# Patient Record
Sex: Female | Born: 1950
Health system: Southern US, Community
[De-identification: ages and names within clinical notes are randomized; demographics above are authoritative.]

## PROBLEM LIST (undated history)

## (undated) DIAGNOSIS — K589 Irritable bowel syndrome without diarrhea: Secondary | ICD-10-CM

## (undated) DIAGNOSIS — R51 Headache: Secondary | ICD-10-CM

## (undated) DIAGNOSIS — Z8719 Personal history of other diseases of the digestive system: Secondary | ICD-10-CM

## (undated) DIAGNOSIS — Z90710 Acquired absence of both cervix and uterus: Secondary | ICD-10-CM

## (undated) DIAGNOSIS — E785 Hyperlipidemia, unspecified: Secondary | ICD-10-CM

## (undated) DIAGNOSIS — M8580 Other specified disorders of bone density and structure, unspecified site: Secondary | ICD-10-CM

## (undated) DIAGNOSIS — M1990 Unspecified osteoarthritis, unspecified site: Secondary | ICD-10-CM

## (undated) DIAGNOSIS — M779 Enthesopathy, unspecified: Secondary | ICD-10-CM

## (undated) DIAGNOSIS — D649 Anemia, unspecified: Secondary | ICD-10-CM

## (undated) DIAGNOSIS — I10 Essential (primary) hypertension: Secondary | ICD-10-CM

## (undated) DIAGNOSIS — S7292XA Unspecified fracture of left femur, initial encounter for closed fracture: Secondary | ICD-10-CM

## (undated) DIAGNOSIS — M2540 Effusion, unspecified joint: Secondary | ICD-10-CM

## (undated) DIAGNOSIS — Z889 Allergy status to unspecified drugs, medicaments and biological substances status: Secondary | ICD-10-CM

## (undated) DIAGNOSIS — Z9189 Other specified personal risk factors, not elsewhere classified: Secondary | ICD-10-CM

## (undated) DIAGNOSIS — S92919A Unspecified fracture of unspecified toe(s), initial encounter for closed fracture: Secondary | ICD-10-CM

## (undated) DIAGNOSIS — Z9889 Other specified postprocedural states: Secondary | ICD-10-CM

## (undated) DIAGNOSIS — M549 Dorsalgia, unspecified: Secondary | ICD-10-CM

## (undated) DIAGNOSIS — R05 Cough: Secondary | ICD-10-CM

## (undated) DIAGNOSIS — L0390 Cellulitis, unspecified: Secondary | ICD-10-CM

## (undated) DIAGNOSIS — K219 Gastro-esophageal reflux disease without esophagitis: Secondary | ICD-10-CM

## (undated) DIAGNOSIS — R519 Headache, unspecified: Secondary | ICD-10-CM

## (undated) DIAGNOSIS — R238 Other skin changes: Secondary | ICD-10-CM

## (undated) DIAGNOSIS — Z8709 Personal history of other diseases of the respiratory system: Secondary | ICD-10-CM

## (undated) DIAGNOSIS — R233 Spontaneous ecchymoses: Secondary | ICD-10-CM

## (undated) DIAGNOSIS — R55 Syncope and collapse: Secondary | ICD-10-CM

## (undated) DIAGNOSIS — Z981 Arthrodesis status: Secondary | ICD-10-CM

## (undated) DIAGNOSIS — M069 Rheumatoid arthritis, unspecified: Secondary | ICD-10-CM

## (undated) DIAGNOSIS — M797 Fibromyalgia: Secondary | ICD-10-CM

## (undated) DIAGNOSIS — G8929 Other chronic pain: Secondary | ICD-10-CM

## (undated) DIAGNOSIS — R112 Nausea with vomiting, unspecified: Secondary | ICD-10-CM

## (undated) DIAGNOSIS — T50995A Adverse effect of other drugs, medicaments and biological substances, initial encounter: Secondary | ICD-10-CM

## (undated) DIAGNOSIS — R002 Palpitations: Secondary | ICD-10-CM

## (undated) DIAGNOSIS — G629 Polyneuropathy, unspecified: Secondary | ICD-10-CM

## (undated) DIAGNOSIS — G4700 Insomnia, unspecified: Secondary | ICD-10-CM

## (undated) DIAGNOSIS — M2550 Pain in unspecified joint: Secondary | ICD-10-CM

## (undated) DIAGNOSIS — I4510 Unspecified right bundle-branch block: Secondary | ICD-10-CM

## (undated) DIAGNOSIS — Z9089 Acquired absence of other organs: Secondary | ICD-10-CM

## (undated) DIAGNOSIS — Z86010 Personal history of colonic polyps: Secondary | ICD-10-CM

## (undated) DIAGNOSIS — E049 Nontoxic goiter, unspecified: Secondary | ICD-10-CM

## (undated) HISTORY — PX: BUNIONECTOMY: SHX129

## (undated) HISTORY — DX: Unspecified fracture of left femur, initial encounter for closed fracture: S72.92XA

## (undated) HISTORY — DX: Polyneuropathy, unspecified: G62.9

## (undated) HISTORY — DX: Syncope and collapse: R55

## (undated) HISTORY — DX: Arthrodesis status: Z98.1

## (undated) HISTORY — DX: Other specified disorders of bone density and structure, unspecified site: M85.80

## (undated) HISTORY — DX: Nontoxic goiter, unspecified: E04.9

## (undated) HISTORY — PX: TONSILLECTOMY: SUR1361

## (undated) HISTORY — DX: Rheumatoid arthritis, unspecified: M06.9

## (undated) HISTORY — DX: Palpitations: R00.2

## (undated) HISTORY — DX: Unspecified right bundle-branch block: I45.10

## (undated) HISTORY — DX: Acquired absence of other organs: Z90.89

## (undated) HISTORY — DX: Acquired absence of both cervix and uterus: Z90.710

## (undated) HISTORY — DX: Cough: R05

## (undated) HISTORY — DX: Hyperlipidemia, unspecified: E78.5

## (undated) HISTORY — PX: BACK SURGERY: SHX140

## (undated) HISTORY — DX: Irritable bowel syndrome without diarrhea: K58.9

## (undated) HISTORY — DX: Personal history of other diseases of the digestive system: Z87.19

## (undated) HISTORY — PX: KNEE ARTHROPLASTY: SHX992

## (undated) HISTORY — DX: Adverse effect of other drugs, medicaments and biological substances, initial encounter: T50.995A

## (undated) HISTORY — DX: Essential (primary) hypertension: I10

## (undated) HISTORY — DX: Other specified personal risk factors, not elsewhere classified: Z91.89

## (undated) HISTORY — PX: JOINT REPLACEMENT: SHX530

## (undated) HISTORY — PX: CERVICAL FUSION: SHX112

## (undated) HISTORY — DX: Unspecified osteoarthritis, unspecified site: M19.90

## (undated) HISTORY — DX: Personal history of colonic polyps: Z86.010

## (undated) HISTORY — DX: Unspecified fracture of unspecified toe(s), initial encounter for closed fracture: S92.919A

## (undated) HISTORY — DX: Cellulitis, unspecified: L03.90

## (undated) HISTORY — PX: OTHER SURGICAL HISTORY: SHX169

---

## 1984-03-02 HISTORY — PX: TOTAL ABDOMINAL HYSTERECTOMY W/ BILATERAL SALPINGOOPHORECTOMY: SHX83

## 1987-03-03 HISTORY — PX: REDUCTION MAMMAPLASTY: SUR839

## 1994-03-02 HISTORY — PX: CHOLECYSTECTOMY: SHX55

## 1994-12-09 ENCOUNTER — Encounter: Payer: Self-pay | Admitting: Internal Medicine

## 1997-12-28 ENCOUNTER — Ambulatory Visit (HOSPITAL_COMMUNITY): Admission: RE | Admit: 1997-12-28 | Discharge: 1997-12-28 | Payer: Self-pay | Admitting: Obstetrics and Gynecology

## 1997-12-28 ENCOUNTER — Encounter: Payer: Self-pay | Admitting: Obstetrics and Gynecology

## 1998-04-19 ENCOUNTER — Other Ambulatory Visit: Admission: RE | Admit: 1998-04-19 | Discharge: 1998-04-19 | Payer: Self-pay | Admitting: Obstetrics and Gynecology

## 1998-07-01 ENCOUNTER — Other Ambulatory Visit: Admission: RE | Admit: 1998-07-01 | Discharge: 1998-07-01 | Payer: Self-pay | Admitting: Obstetrics and Gynecology

## 1998-11-27 ENCOUNTER — Other Ambulatory Visit: Admission: RE | Admit: 1998-11-27 | Discharge: 1998-11-27 | Payer: Self-pay | Admitting: Obstetrics and Gynecology

## 1999-01-22 ENCOUNTER — Ambulatory Visit (HOSPITAL_COMMUNITY): Admission: RE | Admit: 1999-01-22 | Discharge: 1999-01-22 | Payer: Self-pay | Admitting: Obstetrics and Gynecology

## 1999-01-22 ENCOUNTER — Encounter: Payer: Self-pay | Admitting: Obstetrics and Gynecology

## 1999-07-21 ENCOUNTER — Encounter: Payer: Self-pay | Admitting: Neurosurgery

## 1999-07-25 ENCOUNTER — Encounter: Payer: Self-pay | Admitting: Neurosurgery

## 1999-07-25 ENCOUNTER — Ambulatory Visit (HOSPITAL_COMMUNITY): Admission: RE | Admit: 1999-07-25 | Discharge: 1999-07-25 | Payer: Self-pay | Admitting: Neurosurgery

## 1999-10-13 ENCOUNTER — Other Ambulatory Visit: Admission: RE | Admit: 1999-10-13 | Discharge: 1999-10-13 | Payer: Self-pay | Admitting: Obstetrics and Gynecology

## 2000-02-04 ENCOUNTER — Ambulatory Visit (HOSPITAL_COMMUNITY): Admission: RE | Admit: 2000-02-04 | Discharge: 2000-02-04 | Payer: Self-pay | Admitting: Gastroenterology

## 2000-02-27 ENCOUNTER — Encounter: Payer: Self-pay | Admitting: Obstetrics and Gynecology

## 2000-02-27 ENCOUNTER — Ambulatory Visit (HOSPITAL_COMMUNITY): Admission: RE | Admit: 2000-02-27 | Discharge: 2000-02-27 | Payer: Self-pay | Admitting: Obstetrics and Gynecology

## 2000-10-25 ENCOUNTER — Ambulatory Visit (HOSPITAL_COMMUNITY): Admission: RE | Admit: 2000-10-25 | Discharge: 2000-10-25 | Payer: Self-pay | Admitting: Gastroenterology

## 2000-12-10 ENCOUNTER — Other Ambulatory Visit: Admission: RE | Admit: 2000-12-10 | Discharge: 2000-12-10 | Payer: Self-pay | Admitting: Obstetrics and Gynecology

## 2001-03-17 ENCOUNTER — Ambulatory Visit (HOSPITAL_COMMUNITY): Admission: RE | Admit: 2001-03-17 | Discharge: 2001-03-17 | Payer: Self-pay | Admitting: Obstetrics and Gynecology

## 2001-03-17 ENCOUNTER — Encounter: Payer: Self-pay | Admitting: Obstetrics and Gynecology

## 2001-03-24 ENCOUNTER — Encounter: Payer: Self-pay | Admitting: Obstetrics and Gynecology

## 2001-03-24 ENCOUNTER — Encounter: Admission: RE | Admit: 2001-03-24 | Discharge: 2001-03-24 | Payer: Self-pay | Admitting: Obstetrics and Gynecology

## 2002-04-03 ENCOUNTER — Encounter: Admission: RE | Admit: 2002-04-03 | Discharge: 2002-04-03 | Payer: Self-pay | Admitting: Obstetrics and Gynecology

## 2002-04-03 ENCOUNTER — Encounter: Payer: Self-pay | Admitting: Obstetrics and Gynecology

## 2003-04-17 ENCOUNTER — Encounter: Admission: RE | Admit: 2003-04-17 | Discharge: 2003-04-17 | Payer: Self-pay | Admitting: Obstetrics and Gynecology

## 2003-11-26 ENCOUNTER — Encounter: Admission: RE | Admit: 2003-11-26 | Discharge: 2003-11-26 | Payer: Self-pay | Admitting: Internal Medicine

## 2004-01-31 ENCOUNTER — Ambulatory Visit: Payer: Self-pay | Admitting: Internal Medicine

## 2004-02-08 ENCOUNTER — Ambulatory Visit: Payer: Self-pay | Admitting: Internal Medicine

## 2004-04-23 ENCOUNTER — Encounter: Admission: RE | Admit: 2004-04-23 | Discharge: 2004-04-23 | Payer: Self-pay | Admitting: Obstetrics and Gynecology

## 2004-09-11 ENCOUNTER — Observation Stay (HOSPITAL_COMMUNITY): Admission: RE | Admit: 2004-09-11 | Discharge: 2004-09-12 | Payer: Self-pay | Admitting: Orthopedic Surgery

## 2004-12-12 ENCOUNTER — Ambulatory Visit: Payer: Self-pay | Admitting: Internal Medicine

## 2005-04-03 ENCOUNTER — Other Ambulatory Visit: Admission: RE | Admit: 2005-04-03 | Discharge: 2005-04-03 | Payer: Self-pay | Admitting: Obstetrics & Gynecology

## 2005-05-04 ENCOUNTER — Encounter: Admission: RE | Admit: 2005-05-04 | Discharge: 2005-05-04 | Payer: Self-pay | Admitting: Obstetrics & Gynecology

## 2006-01-11 ENCOUNTER — Ambulatory Visit: Payer: Self-pay | Admitting: Internal Medicine

## 2006-03-16 ENCOUNTER — Ambulatory Visit: Payer: Self-pay | Admitting: Cardiovascular Disease

## 2006-03-30 ENCOUNTER — Other Ambulatory Visit: Admission: RE | Admit: 2006-03-30 | Discharge: 2006-03-30 | Payer: Self-pay | Admitting: Obstetrics & Gynecology

## 2006-04-05 ENCOUNTER — Ambulatory Visit: Payer: Self-pay

## 2006-04-05 ENCOUNTER — Encounter: Payer: Self-pay | Admitting: Internal Medicine

## 2006-04-05 ENCOUNTER — Encounter: Payer: Self-pay | Admitting: Cardiology

## 2006-05-24 ENCOUNTER — Encounter: Admission: RE | Admit: 2006-05-24 | Discharge: 2006-05-24 | Payer: Self-pay | Admitting: Obstetrics & Gynecology

## 2006-09-17 ENCOUNTER — Ambulatory Visit: Payer: Self-pay | Admitting: Internal Medicine

## 2006-12-02 ENCOUNTER — Encounter: Payer: Self-pay | Admitting: Internal Medicine

## 2006-12-29 ENCOUNTER — Ambulatory Visit: Payer: Self-pay | Admitting: Internal Medicine

## 2006-12-29 DIAGNOSIS — M069 Rheumatoid arthritis, unspecified: Secondary | ICD-10-CM | POA: Insufficient documentation

## 2006-12-29 DIAGNOSIS — E785 Hyperlipidemia, unspecified: Secondary | ICD-10-CM

## 2006-12-29 DIAGNOSIS — I10 Essential (primary) hypertension: Secondary | ICD-10-CM

## 2006-12-29 DIAGNOSIS — J019 Acute sinusitis, unspecified: Secondary | ICD-10-CM | POA: Insufficient documentation

## 2006-12-29 HISTORY — DX: Essential (primary) hypertension: I10

## 2006-12-29 HISTORY — DX: Hyperlipidemia, unspecified: E78.5

## 2007-01-07 ENCOUNTER — Encounter: Payer: Self-pay | Admitting: Internal Medicine

## 2007-02-09 ENCOUNTER — Ambulatory Visit: Payer: Self-pay | Admitting: Internal Medicine

## 2007-02-09 DIAGNOSIS — T50995A Adverse effect of other drugs, medicaments and biological substances, initial encounter: Secondary | ICD-10-CM

## 2007-02-09 DIAGNOSIS — Z860100 Personal history of colon polyps, unspecified: Secondary | ICD-10-CM

## 2007-02-09 DIAGNOSIS — Z9089 Acquired absence of other organs: Secondary | ICD-10-CM | POA: Insufficient documentation

## 2007-02-09 DIAGNOSIS — Z9189 Other specified personal risk factors, not elsewhere classified: Secondary | ICD-10-CM

## 2007-02-09 DIAGNOSIS — Z8719 Personal history of other diseases of the digestive system: Secondary | ICD-10-CM | POA: Insufficient documentation

## 2007-02-09 DIAGNOSIS — I4510 Unspecified right bundle-branch block: Secondary | ICD-10-CM

## 2007-02-09 DIAGNOSIS — Z8711 Personal history of peptic ulcer disease: Secondary | ICD-10-CM | POA: Insufficient documentation

## 2007-02-09 DIAGNOSIS — R002 Palpitations: Secondary | ICD-10-CM | POA: Insufficient documentation

## 2007-02-09 HISTORY — DX: Other specified personal risk factors, not elsewhere classified: Z91.89

## 2007-02-09 HISTORY — DX: Personal history of colon polyps, unspecified: Z86.0100

## 2007-02-09 HISTORY — DX: Palpitations: R00.2

## 2007-02-09 HISTORY — DX: Personal history of other diseases of the digestive system: Z87.19

## 2007-02-09 HISTORY — DX: Personal history of colonic polyps: Z86.010

## 2007-02-09 HISTORY — DX: Unspecified right bundle-branch block: I45.10

## 2007-02-09 HISTORY — DX: Adverse effect of other drugs, medicaments and biological substances, initial encounter: T50.995A

## 2007-04-05 ENCOUNTER — Encounter: Payer: Self-pay | Admitting: Internal Medicine

## 2007-04-21 ENCOUNTER — Ambulatory Visit: Payer: Self-pay | Admitting: Internal Medicine

## 2007-04-24 LAB — CONVERTED CEMR LAB
BUN: 13 mg/dL (ref 6–23)
CO2: 28 meq/L (ref 19–32)
Calcium: 9.9 mg/dL (ref 8.4–10.5)
Chloride: 104 meq/L (ref 96–112)
Cholesterol: 210 mg/dL (ref 0–200)
Creatinine, Ser: 0.8 mg/dL (ref 0.4–1.2)
Direct LDL: 108.9 mg/dL
GFR calc Af Amer: 96 mL/min
GFR calc non Af Amer: 79 mL/min
Glucose, Bld: 112 mg/dL — ABNORMAL HIGH (ref 70–99)
HDL: 69.7 mg/dL (ref >39.0)
Potassium: 4.4 meq/L (ref 3.5–5.1)
Sodium: 142 meq/L (ref 135–145)
TSH: 1.07 microintl units/mL (ref 0.35–5.50)
Total CHOL/HDL Ratio: 3
Triglycerides: 80 mg/dL (ref 0–149)
VLDL: 16 mg/dL (ref 0–40)

## 2007-04-28 ENCOUNTER — Ambulatory Visit: Payer: Self-pay | Admitting: Internal Medicine

## 2007-04-28 DIAGNOSIS — K589 Irritable bowel syndrome without diarrhea: Secondary | ICD-10-CM

## 2007-04-28 HISTORY — DX: Irritable bowel syndrome, unspecified: K58.9

## 2007-04-28 LAB — CONVERTED CEMR LAB
Cholesterol, target level: 200 mg/dL
HDL goal, serum: 40 mg/dL
LDL Goal: 160 mg/dL

## 2007-05-12 ENCOUNTER — Other Ambulatory Visit: Admission: RE | Admit: 2007-05-12 | Discharge: 2007-05-12 | Payer: Self-pay | Admitting: Obstetrics & Gynecology

## 2007-05-26 ENCOUNTER — Encounter: Admission: RE | Admit: 2007-05-26 | Discharge: 2007-05-26 | Payer: Self-pay | Admitting: Obstetrics & Gynecology

## 2007-06-16 ENCOUNTER — Encounter: Payer: Self-pay | Admitting: Internal Medicine

## 2007-07-15 ENCOUNTER — Ambulatory Visit: Payer: Self-pay | Admitting: Psychology

## 2007-07-26 ENCOUNTER — Encounter: Payer: Self-pay | Admitting: Internal Medicine

## 2007-08-02 ENCOUNTER — Ambulatory Visit: Payer: Self-pay

## 2007-08-30 ENCOUNTER — Ambulatory Visit: Payer: Self-pay

## 2007-09-07 ENCOUNTER — Encounter: Payer: Self-pay | Admitting: Internal Medicine

## 2007-09-21 ENCOUNTER — Ambulatory Visit: Payer: Self-pay

## 2007-10-25 ENCOUNTER — Encounter: Payer: Self-pay | Admitting: Internal Medicine

## 2007-11-09 ENCOUNTER — Ambulatory Visit: Payer: Self-pay

## 2007-12-03 ENCOUNTER — Emergency Department (HOSPITAL_COMMUNITY): Admission: EM | Admit: 2007-12-03 | Discharge: 2007-12-03 | Payer: Self-pay | Admitting: Emergency Medicine

## 2007-12-07 ENCOUNTER — Ambulatory Visit: Payer: Self-pay | Admitting: Internal Medicine

## 2007-12-07 DIAGNOSIS — R55 Syncope and collapse: Secondary | ICD-10-CM | POA: Insufficient documentation

## 2007-12-07 DIAGNOSIS — N959 Unspecified menopausal and perimenopausal disorder: Secondary | ICD-10-CM | POA: Insufficient documentation

## 2007-12-07 DIAGNOSIS — R21 Rash and other nonspecific skin eruption: Secondary | ICD-10-CM | POA: Insufficient documentation

## 2007-12-07 HISTORY — DX: Syncope and collapse: R55

## 2007-12-12 ENCOUNTER — Ambulatory Visit: Payer: Self-pay | Admitting: Cardiovascular Disease

## 2007-12-13 ENCOUNTER — Ambulatory Visit: Payer: Self-pay

## 2008-01-17 ENCOUNTER — Ambulatory Visit: Payer: Self-pay

## 2008-01-24 ENCOUNTER — Encounter: Payer: Self-pay | Admitting: Internal Medicine

## 2008-05-07 ENCOUNTER — Encounter: Admission: RE | Admit: 2008-05-07 | Discharge: 2008-05-07 | Payer: Self-pay | Admitting: Obstetrics & Gynecology

## 2008-05-17 ENCOUNTER — Other Ambulatory Visit: Admission: RE | Admit: 2008-05-17 | Discharge: 2008-05-17 | Payer: Self-pay | Admitting: Obstetrics & Gynecology

## 2008-07-09 ENCOUNTER — Telehealth: Payer: Self-pay | Admitting: *Deleted

## 2008-11-28 ENCOUNTER — Telehealth: Payer: Self-pay | Admitting: *Deleted

## 2008-12-18 ENCOUNTER — Ambulatory Visit: Payer: Self-pay | Admitting: Internal Medicine

## 2008-12-18 DIAGNOSIS — R635 Abnormal weight gain: Secondary | ICD-10-CM | POA: Insufficient documentation

## 2008-12-21 ENCOUNTER — Ambulatory Visit: Payer: Self-pay | Admitting: Internal Medicine

## 2008-12-24 LAB — CONVERTED CEMR LAB
ALT: 18 units/L (ref 0–35)
AST: 21 units/L (ref 0–37)
Albumin: 3.9 g/dL (ref 3.5–5.2)
Alkaline Phosphatase: 66 units/L (ref 39–117)
BUN: 22 mg/dL (ref 6–23)
Basophils Absolute: 0.1 10*3/uL (ref 0.0–0.1)
Basophils Relative: 1.8 % (ref 0.0–3.0)
Bilirubin, Direct: 0.1 mg/dL (ref 0.0–0.3)
CO2: 30 meq/L (ref 19–32)
Calcium: 9.1 mg/dL (ref 8.4–10.5)
Chloride: 105 meq/L (ref 96–112)
Cholesterol: 250 mg/dL — ABNORMAL HIGH (ref 0–200)
Creatinine, Ser: 0.8 mg/dL (ref 0.4–1.2)
Direct LDL: 162.5 mg/dL
Eosinophils Absolute: 0.1 10*3/uL (ref 0.0–0.7)
Eosinophils Relative: 1.3 % (ref 0.0–5.0)
GFR calc non Af Amer: 78.7 mL/min (ref >60)
Glucose, Bld: 96 mg/dL (ref 70–99)
HCT: 39.6 % (ref 36.0–46.0)
HDL: 64.5 mg/dL (ref >39.00)
Hemoglobin: 13.6 g/dL (ref 12.0–15.0)
Lymphocytes Relative: 32.8 % (ref 12.0–46.0)
Lymphs Abs: 1.6 10*3/uL (ref 0.7–4.0)
MCHC: 34.4 g/dL (ref 30.0–36.0)
MCV: 95.5 fL (ref 78.0–100.0)
Monocytes Absolute: 0.3 10*3/uL (ref 0.1–1.0)
Monocytes Relative: 6 % (ref 3.0–12.0)
Neutro Abs: 2.7 10*3/uL (ref 1.4–7.7)
Neutrophils Relative %: 58.1 % (ref 43.0–77.0)
Platelets: 279 10*3/uL (ref 150.0–400.0)
Potassium: 4.4 meq/L (ref 3.5–5.1)
RBC: 4.15 M/uL (ref 3.87–5.11)
RDW: 11.8 % (ref 11.5–14.6)
Sodium: 143 meq/L (ref 135–145)
TSH: 0.58 microintl units/mL (ref 0.35–5.50)
Total Bilirubin: 1.1 mg/dL (ref 0.3–1.2)
Total CHOL/HDL Ratio: 4
Total CK: 58 units/L (ref 7–177)
Total Protein: 7.3 g/dL (ref 6.0–8.3)
Triglycerides: 115 mg/dL (ref 0.0–149.0)
VLDL: 23 mg/dL (ref 0.0–40.0)
WBC: 4.8 10*3/uL (ref 4.5–10.5)

## 2008-12-25 ENCOUNTER — Encounter (INDEPENDENT_AMBULATORY_CARE_PROVIDER_SITE_OTHER): Payer: Self-pay | Admitting: *Deleted

## 2009-01-29 ENCOUNTER — Telehealth: Payer: Self-pay | Admitting: *Deleted

## 2009-03-21 ENCOUNTER — Encounter: Payer: Self-pay | Admitting: Internal Medicine

## 2009-04-15 ENCOUNTER — Encounter: Payer: Self-pay | Admitting: Internal Medicine

## 2009-04-25 ENCOUNTER — Telehealth: Payer: Self-pay | Admitting: *Deleted

## 2009-04-25 ENCOUNTER — Ambulatory Visit: Payer: Self-pay | Admitting: Internal Medicine

## 2009-04-26 ENCOUNTER — Telehealth: Payer: Self-pay | Admitting: Internal Medicine

## 2009-04-29 LAB — CONVERTED CEMR LAB
Cholesterol: 235 mg/dL — ABNORMAL HIGH (ref 0–200)
Direct LDL: 138.4 mg/dL
HDL: 83.3 mg/dL (ref >39.00)
Total CHOL/HDL Ratio: 3
Triglycerides: 92 mg/dL (ref 0.0–149.0)
VLDL: 18.4 mg/dL (ref 0.0–40.0)

## 2009-05-16 ENCOUNTER — Encounter: Payer: Self-pay | Admitting: Internal Medicine

## 2009-05-28 ENCOUNTER — Encounter: Admission: RE | Admit: 2009-05-28 | Discharge: 2009-05-28 | Payer: Self-pay | Admitting: Obstetrics & Gynecology

## 2009-05-31 LAB — CONVERTED CEMR LAB: Pap Smear: NORMAL

## 2009-07-16 ENCOUNTER — Telehealth: Payer: Self-pay | Admitting: *Deleted

## 2009-07-23 ENCOUNTER — Ambulatory Visit: Payer: Self-pay | Admitting: Internal Medicine

## 2009-07-23 DIAGNOSIS — H6120 Impacted cerumen, unspecified ear: Secondary | ICD-10-CM | POA: Insufficient documentation

## 2009-08-06 ENCOUNTER — Encounter: Payer: Self-pay | Admitting: Internal Medicine

## 2009-10-01 ENCOUNTER — Encounter: Admission: RE | Admit: 2009-10-01 | Discharge: 2009-10-01 | Payer: Self-pay | Admitting: Rheumatology

## 2009-12-31 ENCOUNTER — Telehealth: Payer: Self-pay | Admitting: *Deleted

## 2010-01-01 ENCOUNTER — Encounter: Payer: Self-pay | Admitting: Internal Medicine

## 2010-01-01 ENCOUNTER — Encounter: Admission: RE | Admit: 2010-01-01 | Discharge: 2010-01-01 | Payer: Self-pay | Admitting: Rheumatology

## 2010-01-03 ENCOUNTER — Encounter: Admission: RE | Admit: 2010-01-03 | Discharge: 2010-01-03 | Payer: Self-pay | Admitting: Rheumatology

## 2010-01-03 ENCOUNTER — Encounter: Payer: Self-pay | Admitting: Internal Medicine

## 2010-01-10 ENCOUNTER — Telehealth: Payer: Self-pay | Admitting: Internal Medicine

## 2010-01-21 ENCOUNTER — Encounter: Payer: Self-pay | Admitting: Internal Medicine

## 2010-02-06 ENCOUNTER — Ambulatory Visit: Payer: Self-pay | Admitting: Internal Medicine

## 2010-02-06 ENCOUNTER — Encounter: Payer: Self-pay | Admitting: Internal Medicine

## 2010-02-06 LAB — CONVERTED CEMR LAB
ALT: 19 U/L
AST: 17 U/L
Albumin: 4.1 g/dL
Alkaline Phosphatase: 64 U/L
BUN: 24 mg/dL — ABNORMAL HIGH
Basophils Absolute: 0.1 10*3/uL
Basophils Relative: 1.7 %
Bilirubin Urine: NEGATIVE
Bilirubin, Direct: 0.1 mg/dL
CO2: 28 meq/L
Calcium: 9.3 mg/dL
Chloride: 103 meq/L
Cholesterol: 287 mg/dL — ABNORMAL HIGH
Creatinine, Ser: 0.8 mg/dL
Direct LDL: 168.4 mg/dL
Eosinophils Absolute: 0.1 10*3/uL
Eosinophils Relative: 0.8 %
GFR calc non Af Amer: 83.16 mL/min
Glucose, Bld: 76 mg/dL
Glucose, Urine, Semiquant: NEGATIVE
HCT: 36.9 %
HDL: 84.6 mg/dL
Hemoglobin: 12.7 g/dL
Ketones, urine, test strip: NEGATIVE
Lymphocytes Relative: 38.9 %
Lymphs Abs: 2.7 10*3/uL
MCHC: 34.3 g/dL
MCV: 93.8 fL
Monocytes Absolute: 0.4 10*3/uL
Monocytes Relative: 5.6 %
Neutro Abs: 3.7 10*3/uL
Neutrophils Relative %: 53 %
Nitrite: NEGATIVE
Platelets: 302 10*3/uL
Potassium: 3.7 meq/L
RBC: 3.94 M/uL
RDW: 14.6 %
Sodium: 141 meq/L
Specific Gravity, Urine: 1.02
TSH: 1 u[IU]/mL
Total Bilirubin: 0.8 mg/dL
Total CHOL/HDL Ratio: 3
Total Protein: 7.1 g/dL
Triglycerides: 111 mg/dL
Urobilinogen, UA: 0.2
VLDL: 22.2 mg/dL
Vit D, 25-Hydroxy: 47 ng/mL
WBC Urine, dipstick: NEGATIVE
WBC: 6.9 10*3/uL
pH: 5

## 2010-02-19 ENCOUNTER — Ambulatory Visit: Payer: Self-pay | Admitting: Internal Medicine

## 2010-02-19 DIAGNOSIS — M899 Disorder of bone, unspecified: Secondary | ICD-10-CM | POA: Insufficient documentation

## 2010-02-19 DIAGNOSIS — K219 Gastro-esophageal reflux disease without esophagitis: Secondary | ICD-10-CM | POA: Insufficient documentation

## 2010-02-19 DIAGNOSIS — G479 Sleep disorder, unspecified: Secondary | ICD-10-CM | POA: Insufficient documentation

## 2010-02-19 DIAGNOSIS — M949 Disorder of cartilage, unspecified: Secondary | ICD-10-CM

## 2010-02-19 DIAGNOSIS — J309 Allergic rhinitis, unspecified: Secondary | ICD-10-CM | POA: Insufficient documentation

## 2010-02-19 DIAGNOSIS — M129 Arthropathy, unspecified: Secondary | ICD-10-CM | POA: Insufficient documentation

## 2010-02-19 DIAGNOSIS — M4802 Spinal stenosis, cervical region: Secondary | ICD-10-CM | POA: Insufficient documentation

## 2010-02-19 LAB — CONVERTED CEMR LAB
Bilirubin Urine: NEGATIVE
Blood in Urine, dipstick: NEGATIVE
Glucose, Urine, Semiquant: NEGATIVE
Ketones, urine, test strip: NEGATIVE
Nitrite: NEGATIVE
Protein, U semiquant: NEGATIVE
Specific Gravity, Urine: 1.015
Urobilinogen, UA: 0.2
WBC Urine, dipstick: NEGATIVE
pH: 6

## 2010-03-04 ENCOUNTER — Encounter: Payer: Self-pay | Admitting: Internal Medicine

## 2010-03-10 ENCOUNTER — Encounter: Payer: Self-pay | Admitting: Internal Medicine

## 2010-04-01 NOTE — Assessment & Plan Note (Signed)
Summary: post hosp per dr Remi Lopata/ccm   Vital Signs:  Patient Profile:   60 Years Old Female Weight:      187 pounds Pulse rate:   72 / minute BP sitting:   160 / 100  (left arm) Cuff size:   regular  Vitals Entered By: Romualdo Bolk, CMA (December 07, 2007 10:22 AM)                  Chief Complaint:  Post Hospital Follow Up.  History of Present Illness: Jade Cardenas is here for a post hospital follow up. For syncope  and couple of other problems  Had syncope with prodrome had sweating and then lightheaded   and no cp sob. see er notes.   Had nl ekg and labs  . ? if from dehydration as needed 2 liters to get urin output.  Was  out for 5 minutes .   Felt drained and came to and went to er where BP was elevated.   Past hx of syncope rarely   related to heat . Saw Dr Eden Emms 2 years ago had  rbbb and ok echo .   Stress at work Husband died suddently  this year .  But she is working  well.   BP readings :were   up and we increased enalapril to 20 mg  but this caused .se and she decreased to 15 mg .  Her BP readings over the summer an at [facility] are usually 110-130 range with hr 80-100. She does not have a cuff with her today. taking Vemma  vitamin supplements ( mostly vitamins )  feels more energetic    ? not a stimulant. Local reaction to flu shot recently plese check  .Had tenderness and swelling  . improved .    Heel bone spur  and had been put on indocin for 2 weeks and then as needed.  NO recent meds changes but off of MTX . R upper arm itchy rash for weeks no rx ecept lotion .    Prior Medications Reviewed Using: Patient Recall  Updated Prior Medication List: VIVELLE-DOT 0.0375 MG/24HR  PTTW (ESTRADIOL)  LOVASTATIN 40 MG  TABS (LOVASTATIN) 1 by mouth once daily VASOTEC 10 MG  TABS (ENALAPRIL MALEATE) 1 by mouth once daily ACCU-CHEK AVIVA   STRP (GLUCOSE BLOOD) check blood sugar as directed  Current Allergies (reviewed today): ! 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     Physical Exam  General:     alert, well-developed, and well-nourished.   Head:     normocephalic and atraumatic.   Eyes:     vision grossly intact, pupils equal, and  pupils round.   Ears:     R ear normal and L ear normal.   Neck:     supple, full ROM, and no masses.   Lungs:     Normal respiratory effort, chest expands symmetrically. Lungs are clear to auscultation, no crackles or wheezes.no dullness.   Heart:     Normal rate and regular rhythm. S1 and S2 normal without gallop, murmur, click, rub or other extra sounds.no lifts.   Abdomen:     Bowel sounds positive,abdomen soft and non-tender without masses, organomegaly onoted.no rebound tenderness.   Msk:     no acute effusions r upper arm with about 6 cm [place] swelling without redness or warmth mild tenderness.  Pulses:     intact  without delay Extremities:     no cce  Neurologic:     alert & oriented X3, gait normal, and DTRs symmetrical and normal.   Skin:     turgor normal and color normal.   sun changes r upper arm anteriorly with red raised papular rash with out vesicle or pustule . no scaling  Cervical Nodes:     No lymphadenopathy noted Psych:     Oriented X3, normally interactive, and  good eye contact.  appropriate Additional Exam:     see er records  labs normal    Impression & Recommendations:  Problem # 1:  SYNCOPE (ICD-780.2) may be vasovagal but pt wasnt aware she was dehydrated .   Orders: Cardiology Referral (Cardiology)   Problem # 2:  HYPERTENSION (ICD-401.9) up today but all her home readings are ok .Marland Kitchen? if labile ht vs other  ? if b blocker would help but dont want to  risk hypotension  if her readings are really that low at home.  REc get cards to see for opinion .   Disc use of vasotec oten is needed two times a day .   Her updated medication list for this problem includes:    Vasotec 10 Mg Tabs (Enalapril maleate) .Marland Kitchen... 1 by mouth once daily  Orders: Cardiology Referral (Cardiology)   Problem # 3:  RIGHT BUNDLE BRANCH BLOCK (ICD-426.4) Assessment: Comment Only  Problem # 4:  RHEUMATOID ARTHRITIS (ICD-714.0)  The following medications were removed from the medication list:    Methotrexate 2.5 Mg Tabs (Methotrexate sodium) .Marland KitchenMarland KitchenMarland KitchenMarland Kitchen 4 every week   Problem # 5:  HYPERLIPIDEMIA (ICD-272.4)  Her updated medication list for this problem includes:    Lovastatin 40 Mg Tabs (Lovastatin) .Marland Kitchen... 1 by mouth once daily   Problem # 6:  MENOPAUSAL DISORDER (ICD-627.9)  Her updated medication list for this problem includes:    Vivelle-dot 0.0375 Mg/24hr Pttw (Estradiol)   Problem # 7:  RASH AND OTHER NONSPECIFIC SKIN ERUPTION (ICD-782.1) seems allergic but if no better in 2-3 weeks get derm to see Her updated medication list for this problem includes:    Fluocinonide 0.05 % Crea (Fluocinonide) .Marland Kitchen... Apply to  rash    two times a day for 2 weeks  or as directed   Problem # 8:  large local rx to flu shot  local care  and recheck as needed   Complete Medication List: 1)  Vivelle-dot 0.0375 Mg/24hr Pttw (Estradiol) 2)  Lovastatin 40 Mg Tabs (Lovastatin) .Marland Kitchen.. 1 by mouth once daily 3)  Vasotec 10 Mg Tabs (Enalapril maleate) .Marland Kitchen.. 1 by mouth once daily 4)   Accu-chek Aviva Strp (Glucose blood) .... Check blood sugar as directed 5)  Fluocinonide 0.05 % Crea (Fluocinonide) .Marland KitchenMarland KitchenMarland Kitchen  Apply to  rash    two times a day for 2 weeks  or as directed   Patient Instructions: 1)  will refer back to dr Eden Emms regarding bp and  syncope . 2)  Bring in cuff     to next appt to compare.   3)  note for work   Prescriptions: FLUOCINONIDE 0.05 % CREA (FLUOCINONIDE) apply to  rash    two times a day for 2 weeks  or as directed  #15 gm x 1   Entered and Authorized by:   Madelin Headings MD   Signed by:   Madelin Headings MD on 12/07/2007   Method used:   Electronically to        [facility]  [phone]* (retail)       [address] [place], [place]  [address]       Ph: [phone] or [phone]       Fax: [phone]   RxID:   [id number]  ]

## 2010-04-01 NOTE — Letter (Signed)
Summary: Rheumatology-Dr. Kellie Simmering  Rheumatology-Dr. Kellie Simmering   Imported By: Maryln Gottron 02/03/2008 15:37:41  _____________________________________________________________________  External Attachment:    Type:   Image     Comment:   External Document

## 2010-04-01 NOTE — Letter (Signed)
Summary: Dr Kinnie Scales note  Dr Kinnie Scales note   Imported By: Kassie Mends 12/15/2006 14:39:58  _____________________________________________________________________  External Attachment:    Type:   Image     Comment:   Dr. Kinnie Scales note

## 2010-04-01 NOTE — Assessment & Plan Note (Signed)
Summary: BLOOD PRESSURE CHECK/[facility]   Vital Signs:  Patient Profile:   60 Years Old Female Weight:      198 pounds Pulse rate:   72 / minute BP sitting:   140 / 90  (right arm) Cuff size:   regular  Vitals Entered By: Romualdo Bolk, CMA (February 09, 2007 1:47 PM)                 Chief Complaint:  Follow up on BP- Pt having heart palps on new meds.  History of Present Illness: Jade Cardenas comes in today for elevated bp readings  and doing better  but frequent urination causing problems with sleep and now with palpitations.    Newer onset.  Thibks its the meds  but bp readigns are under 130 systolic and pulse in 80s.  Stress about the same.    Using wrist cuff.  newer type.  Headches better with new med and control.  Has colonic polyps and on 5 year call back  Hypertension History:      She complains of palpitations and side effects from treatment, but denies headache, chest pain, and peripheral edema.  She notes the following problems with antihypertensive medication side effects: nocturia  headaches better.        Positive major cardiovascular risk factors include hyperlipidemia and hypertension.  Negative major cardiovascular risk factors include female age less than 50 years old, no history of diabetes, and non-tobacco-user status.     Current Allergies (reviewed today): ! IODINE * MYCINS  Past Medical History:    Hyperlipidemia    Hypertension    Rheumatoid arthritis    gastric ulcer bleeding from ibuprofen    myoview 2/08 hypert response to exercise    RBBB on ekg    Colonic polyps, hx of   Family History:    Reviewed history from 12/29/2006 and no changes required:       Family History of CAD Female 1st degree relative <50       Family History of Colon CA 1st degree relative <60       Family History Hypertension  Social History:    Married    Never Smoked    husband recently unemployed    Review of Systems       see hpi   Physical Exam   General:     Well-developed,well-nourished,in no acute distress; alert,appropriate and cooperative throughout examination Neck:     No deformities, masses, or tenderness noted. Heart:     Normal rate and regular rhythm. S1 and S2 normal without gallop, murmur, click, rub or other extra sounds.   Serial Vital Signs/Assessments:  Time      Position  BP       Pulse  Resp  Temp     By                     120/70                         Madelin Headings MD    Impression & Recommendations:  Problem # 1:  HYPERTENSION (ICD-401.9) better but side effects of meds and will change to insentisfy med dosing  rov in 2 months and plan lipids bmp tsh before visit  call if elevated bp or continued side effect  The following medications were removed from the medication list:    Enalapril Maleate 5 Mg Tabs (Enalapril maleate) .Marland Kitchen... Take 1  tablet by mouth twice a day    Enalapril-hydrochlorothiazide 5-12.5 Mg Tabs (Enalapril-hydrochlorothiazide) .Marland Kitchen... 1 by mouth two times a day  or as directed  Her updated medication list for this problem includes:    Vasotec 10 Mg Tabs (Enalapril maleate) .Marland Kitchen... 1/2 by mouth two times a day for 1 week and then can increase as directed for high blood pressure  BP today: 140/90 Prior BP: 140/80 (12/29/2006)   Problem # 2:  HYPERLIPIDEMIA (ICD-272.4)  Her updated medication list for this problem includes:    Lovastatin 40 Mg Tabs (Lovastatin) .Marland Kitchen... 1 by mouth once daily   Problem # 3:  RHEUMATOID ARTHRITIS (ICD-714.0)  Her updated medication list for this problem includes:    Methotrexate 2.5 Mg Tabs (Methotrexate sodium) .Marland KitchenMarland KitchenMarland KitchenMarland Kitchen 4 every week   Problem # 4:  COLONIC POLYPS, HX OF (ICD-V12.72) Assessment: Comment Only    Problem # 5:  PALPITATIONS (ICD-785.1) poss se of meds will change and follow up call if recurring and problematic or at rov in 2 months.  Problem # 6:  UNS ADVRS EFF OTH RX MEDICINAL&BIOLOGICAL SBSTNC (ZOX-096.04)  Complete Medication List:  1)  Methotrexate 2.5 Mg Tabs (Methotrexate sodium) .... 4 every week 2)  Vivelle-dot 0.0375 Mg/24hr Pttw (Estradiol) 3)  Lovastatin 40 Mg Tabs (Lovastatin) .Marland Kitchen.. 1 by mouth once daily 4)  Vasotec 10 Mg Tabs (Enalapril maleate) .... 1/2 by mouth two times a day for 1 week and then can increase as directed for high blood pressure  Hypertension Assessment/Plan:      The patient's hypertensive risk group is category B: At least one risk factor (excluding diabetes) with no target organ damage.  Today's blood pressure is 140/90.  Her blood pressure goal is < 140/90.     Prescriptions: VASOTEC 10 MG  TABS (ENALAPRIL MALEATE) 1/2 by mouth two times a day for 1 week and then can increase as directed for high blood pressure  #60 x 3   Entered and Authorized by:   Madelin Headings MD   Signed by:   Madelin Headings MD on 02/09/2007   Method used:   Electronically sent to ...       [facility]  [phone]*       [address]       [place], [place]  [address]       Ph: [phone] or [phone]       Fax: [phone]   RxID:   [id number]  ]

## 2010-04-01 NOTE — Letter (Signed)
Summary: [facility]   Imported By: Maryln Gottron 08/20/2009 11:27:08  _____________________________________________________________________  External Attachment:    Type:   Image     Comment:   External Document

## 2010-04-01 NOTE — Assessment & Plan Note (Signed)
Summary: ear pain/dm   Vital Signs:  Patient profile:   60 year old female Weight:      201 pounds Temp:     97.9 degrees F oral BP sitting:   130 / 80  (left arm) Cuff size:   regular  Vitals Entered By: Duard Brady LPN (Jul 23, 2009 3:49 PM) CC: c/o (R) ear pain , please check (R) arm lump Is Patient Diabetic? No   CC:  c/o (R) ear pain  and please check (R) arm lump.  History of Present Illness: 60 year old patient who awoke with some right ear discomfort.  There is no drainage or fever.  She may have a mild associated sore throat.  She has treated hypertension and arthritis.  She also has a history of dyslipidemia.  Preventive Screening-Counseling & Management  Alcohol-Tobacco     Smoking Status: never  Allergies: 1)  ! Iodine 2)  * Mycins  Past History:  Past Medical History: Reviewed history from 12/07/2007 and no changes required. Hyperlipidemia Hypertension Rheumatoid arthritis gastric ulcer bleeding from ibuprofen myoview 2/08 hypert response to exercise RBBB on ekg Colonic polyps, hx of IBS [employer]  [employer] N[facility]an [facility]  Review of Systems  The patient denies anorexia, fever, weight loss, weight gain, vision loss, decreased hearing, hoarseness, chest pain, syncope, dyspnea on exertion, peripheral edema, prolonged cough, headaches, hemoptysis, abdominal pain, melena, hematochezia, severe indigestion/heartburn, hematuria, incontinence, genital sores, muscle weakness, suspicious skin lesions, transient blindness, difficulty walking, depression, unusual weight change, abnormal bleeding, enlarged lymph nodes, angioedema, and breast masses.    Physical Exam  General:  overweight-appearing.  normal blood pressureoverweight-appearing.   Ears:  right cerumen impaction   Impression & Recommendations:  Problem # 1:  CERUMEN IMPACTION, RIGHT (ICD-380.4) right canal irrigated  until clear  Problem # 2:  HYPERTENSION (ICD-401.9)  Her updated  medication list for this problem includes:    Vasotec 10 Mg Tabs (Enalapril maleate) .Marland Kitchen... 1 by mouth once daily  Her updated medication list for this problem includes:    Vasotec 10 Mg Tabs (Enalapril maleate) .Marland Kitchen... 1 by mouth once daily  Complete Medication List: 1)  Vivelle-dot 0.0375 Mg/24hr Pttw (Estradiol) .Marland Kitchen.. 1 by mouth once daily 2)  Vasotec 10 Mg Tabs (Enalapril maleate) .Marland Kitchen.. 1 by mouth once daily 3)  Accu-chek Aviva Strp (Glucose blood) .... Check blood sugar as directed 4)  Nexium 40 Mg Cpdr (Esomeprazole magnesium) .... Take 1 tab each morning 5)  Multivitamins Tabs (Multiple vitamin) .... Once daily 6)  Fish Oil Oil (Fish oil) .... Once daily 7)  Calcarb 600 1500 Mg Tabs (Calcium carbonate) .... Once daily 8)  Fluocinonide 0.05 % Oint (Fluocinonide) .... Apply to rash two times a day as directed not on face 9)  Neomycin-polymyxin-hc 3.5-10000-1 Soln (Neomycin-polymyxin-hc) .... Two drops to affected ear 4 times daily  Patient Instructions: 1)  Please schedule a follow-up appointment as needed. Prescriptions: NEOMYCIN-POLYMYXIN-HC 3.5-10000-1 SOLN (NEOMYCIN-POLYMYXIN-HC) two drops to affected ear 4 times daily  #10 cc x 0   Entered and Authorized by:   Gordy Savers  MD   Signed by:   Gordy Savers  MD on 07/23/2009   Method used:   Print then Give to Patient   RxID:   [id number] NEOMYCIN-POLYMYXIN-HC 3.5-10000-1 SOLN (NEOMYCIN-POLYMYXIN-HC) two drops to affected ear 4 times daily  #10 cc x 0   Entered and Authorized by:   Gordy Savers  MD   Signed by:   Gordy Savers  MD on  07/23/2009   Method used:   Electronically to        [facility]  [phone]* (retail)       [address] [place], [place]  [address]       Ph: [phone] or [phone]       Fax: [phone]   RxID:   [id number]

## 2010-04-01 NOTE — Assessment & Plan Note (Signed)
Summary: HIGH BLOOD PRESSURE/[facility]  Medications Added METHOTREXATE 2.5 MG  TABS (METHOTREXATE SODIUM) 4 every week LOVASTATIN 40 MG  TABS (LOVASTATIN) 1 by mouth once daily ENALAPRIL-HYDROCHLOROTHIAZIDE 5-12.5 MG  TABS (ENALAPRIL-HYDROCHLOROTHIAZIDE) 1 by mouth two times a day  or as directed CEFDINIR 300 MG  CAPS (CEFDINIR) 1 by mouth two times a day for 10days        Vital Signs:  Patient Profile:   60 Years Old Female Weight:      199 pounds Temp:     98.5 degrees F oral Pulse rate:   60 / minute BP sitting:   140 / 80  (right arm) Cuff size:   regular  Vitals Entered By: Romualdo Bolk, CMA (December 29, 2006 1:28 PM)                 Chief Complaint:  High BP 140/102 on 12/28/06 then last month 140/95 and HA'a.  History of Present Illness: Jade Cardenas comes in today  for an acute visit for recent elevated bp readings. at her specialists office she had been feeling fine until her rti.    only takin 1 enalapril day. no side effect   He had the onset of  colds a few weeks ago and having face pressure at times.  excedrin sinus.   helps some . discharge is beginning to clear.  Had colonoscopy with high risk polyp.   in the recent past followed by [facility] Had CV eval myoview in the spring and normal except inc bp  Current Allergies (reviewed today): ! IODINE * MYCINS  Past Medical History:    Hyperlipidemia    Hypertension    Rheumatoid arthritis    gastric ulcer bleeding from ibuprofen    myoview 2/08 hypert response to exercise    RBBB on ekg  Past Surgical History:    Cholecystectomy    Hysterectomy    breast reduction    back surgery synovial cyst   Family History:    Family History of CAD Female 1st degree relative <50    Family History of Colon CA 1st degree relative <60    Family History Hypertension  Social History:    Married    Never Smoked   Risk Factors:  Tobacco use:  never   Review of Systems       see hpi   Physical Exam   General:     alert, well-developed, and well-nourished.   Head:     normocephalic and atraumatic.   Ears:     R ear normal and L ear normal.   Nose:     mucosal erythema and mucosal edema.  bilaterall maxillary tenderness Mouth:     pharynx pink and moist.   Neck:     No deformities, masses, or tenderness noted. Lungs:     Normal respiratory effort, chest expands symmetrically. Lungs are clear to auscultation, no crackles or wheezes. Heart:     Normal rate and regular rhythm. S1 and S2 normal without gallop, murmur, click, rub or other extra sounds. Pulses:     R and L carotid,radial,femoral,dorsalis pedis and posterior tibial pulses are full and equal bilaterally Cervical Nodes:     No lymphadenopathy noted    Impression & Recommendations:  Problem # 1:  HYPERTENSION (ICD-401.9) not controlled  continue intensification of lifestyle intervention but need to add/inc med    Her updated medication list for this problem includes:    Enalapril Maleate 5 Mg Tabs (Enalapril maleate) .Marland KitchenMarland KitchenMarland KitchenMarland Kitchen  Take 1 tablet by mouth twice a day    Enalapril-hydrochlorothiazide 5-12.5 Mg Tabs (Enalapril-hydrochlorothiazide) .Marland Kitchen... 1 by mouth two times a day  or as directed   Problem # 2:  SINUSITIS- ACUTE-NOS (ICD-461.9)  may clear on its own so continue local treatments and if not resolving in 5 dys or so then can begin the antibiotic. omnicef if needed Her updated medication list for this problem includes:    Cefdinir 300 Mg Caps (Cefdinir) .Marland Kitchen... 1 by mouth two times a day for 10days   Problem # 3:  RHEUMATOID ARTHRITIS (ICD-714.0)  Her updated medication list for this problem includes:    Methotrexate 2.5 Mg Tabs (Methotrexate sodium) .Marland KitchenMarland KitchenMarland KitchenMarland Kitchen 4 every week   Problem # 4:  HYPERLIPIDEMIA (ICD-272.4) managed by  specialist. Her updated medication list for this problem includes:    Lovastatin 40 Mg Tabs (Lovastatin) .Marland Kitchen... 1 by mouth once daily   Complete Medication List: 1)  Enalapril Maleate 5  Mg Tabs (Enalapril maleate) .... Take 1 tablet by mouth twice a day 2)  Methotrexate 2.5 Mg Tabs (Methotrexate sodium) .... 4 every week 3)  Vivelle-dot 0.0375 Mg/24hr Pttw (Estradiol) 4)  Lovastatin 40 Mg Tabs (Lovastatin) .Marland Kitchen.. 1 by mouth once daily 5)  Enalapril-hydrochlorothiazide 5-12.5 Mg Tabs (Enalapril-hydrochlorothiazide) .Marland Kitchen.. 1 by mouth two times a day  or as directed 6)  Cefdinir 300 Mg Caps (Cefdinir) .Marland Kitchen.. 1 by mouth two times a day for 10days   Patient Instructions: 1)  monitor bp change med  2)  rov in 4-6 weeks or as needed  3)  can add on antibiotic if necessary.    Prescriptions: CEFDINIR 300 MG  CAPS (CEFDINIR) 1 by mouth two times a day for 10days  #20 x 0   Entered and Authorized by:   Madelin Headings MD   Signed by:   Madelin Headings MD on 12/29/2006   Method used:   Print then Give to Patient   RxID:   [id number] ENALAPRIL-HYDROCHLOROTHIAZIDE 5-12.5 MG  TABS (ENALAPRIL-HYDROCHLOROTHIAZIDE) 1 by mouth two times a day  or as directed  #60 x 3   Entered and Authorized by:   Madelin Headings MD   Signed by:   Madelin Headings MD on 12/29/2006   Method used:   Print then Give to Patient   RxID:   [id number]  ]

## 2010-04-01 NOTE — Progress Notes (Signed)
Summary: refill  Phone Note From Pharmacy   Caller: [facility] Reason for Call: Needs renewal Details for Reason: lovastatin 40mg , enalapril 10mg  Initial call taken by: Romualdo Bolk, CMA,  Jul 09, 2008 12:02 PM  Follow-up for Phone Call        Sent rx via electronically  Follow-up by: Romualdo Bolk, CMA,  Jul 09, 2008 12:03 PM    New/Updated Medications: LOVASTATIN 40 MG  TABS (LOVASTATIN) 1 by mouth once daily   Prescriptions: VASOTEC 10 MG  TABS (ENALAPRIL MALEATE) 1 by mouth once daily  #90 x 0   Entered by:   Romualdo Bolk, CMA   Authorized by:   Madelin Headings MD   Signed by:   Romualdo Bolk, CMA on 07/09/2008   Method used:   Electronically to        [facility]* (mail-order)             ,          Ph: [phone]       Fax: [phone]   RxID:   [id number] LOVASTATIN 40 MG  TABS (LOVASTATIN) 1 by mouth once daily  #90 x 0   Entered by:   Romualdo Bolk, CMA   Authorized by:   Madelin Headings MD   Signed by:   Romualdo Bolk, CMA on 07/09/2008   Method used:   Electronically to        [facility]* (mail-order)             ,          Ph: [phone]       Fax: [phone]   RxID:   [id number]

## 2010-04-01 NOTE — Progress Notes (Signed)
Summary: Vit D level  Phone Note Call from Patient   Caller: Patient Call For: Madelin Headings MD Reason for Call: Lab or Test Results Summary of Call: patient is calling because she would like a Vitamin D added to her CPX labs which is scheduled in December.  Her GYN gave her a bone density test and she has osteopenia.  They have suggested that she has a Vitamin D level checked.  Is this okay to add? Initial call taken by: Kern Reap CMA Duncan Dull),  January 10, 2010 9:24 AM  Follow-up for Phone Call        yes  .. dx osteopenia 733.9 Follow-up by: Madelin Headings MD,  January 10, 2010 9:31 AM  Additional Follow-up for Phone Call Additional follow up Details #1::        Phone Call Completed Additional Follow-up by: Kern Reap CMA Duncan Dull),  January 10, 2010 11:44 AM

## 2010-04-01 NOTE — Letter (Signed)
Summary: Rheumatology-Dr. Stacey Drain  Rheumatology-Dr. Stacey Drain   Imported By: Maryln Gottron 03/29/2009 10:15:28  _____________________________________________________________________  External Attachment:    Type:   Image     Comment:   External Document

## 2010-04-01 NOTE — Letter (Signed)
Summary: Rheumatology-Dr. Stacey Drain  Rheumatology-Dr. Stacey Drain   Imported By: Maryln Gottron 05/22/2009 13:43:03  _____________________________________________________________________  External Attachment:    Type:   Image     Comment:   External Document

## 2010-04-01 NOTE — Progress Notes (Signed)
Summary: refill  Phone Note From Pharmacy   Caller: [facility] Reason for Call: Needs renewal Details for Reason: Vasoetec 10mg  Initial call taken by: Romualdo Bolk, CMA Duncan Dull),  Jul 16, 2009 2:43 PM  Follow-up for Phone Call        Rx sent to pharmacy Follow-up by: Romualdo Bolk, CMA Duncan Dull),  Jul 16, 2009 2:44 PM    Prescriptions: VASOTEC 10 MG  TABS (ENALAPRIL MALEATE) 1 by mouth once daily  #90 x 1   Entered by:   Romualdo Bolk, CMA (AAMA)   Authorized by:   Madelin Headings MD   Signed by:   Romualdo Bolk, CMA (AAMA) on 07/16/2009   Method used:   Faxed to ...       [facility] (mail[facility]       [address] [place], [place]  [address]       Ph: [phone]       Fax: [phone]   RxID:   [id number]

## 2010-04-01 NOTE — Consult Note (Signed)
Summary: Dr Kellie Simmering note  Dr Kellie Simmering note   Imported By: Kassie Mends 07/13/2007 08:09:28  _____________________________________________________________________  External Attachment:    Type:   Image     Comment:   Dr Kellie Simmering note

## 2010-04-01 NOTE — Progress Notes (Signed)
Summary: REFILL  Phone Note Refill Request Message from:  Fax from Pharmacy  Refills Requested: Medication #1:  VASOTEC 10 MG  TABS 1 by mouth once daily [facility] FA[facility]---[phone]  Initial call taken by: Warnell Forester,  April 26, 2009 10:03 AM  Follow-up for Phone Call        This was faxed electronically on 04/25/2009 #90 with no refills. Follow-up by: Romualdo Bolk, CMA (AAMA),  April 26, 2009 10:08 AM  Additional Follow-up for Phone Call Additional follow up Details #1::        OK....NOTED. Additional Follow-up by: Warnell Forester,  April 26, 2009 10:15 AM

## 2010-04-01 NOTE — Letter (Signed)
Summary: Aundra Dubin, MD  Aundra Dubin, MD Provider: This provider was preselected by the workflow.  Signature: The signature status of this document was preset by the workflow  Processed by [employer] Local Indexer Client @ Thursday, January 03, 2009 3:43:12 PM using version:2010.1.2.11(2.4)   Manually Indexed By: [id number]  idlBatchDetail: [id number]   _____________________________________________________________________  External Attachment:    Type:   Image     Comment:   External Document

## 2010-04-01 NOTE — Letter (Signed)
Summary: Rheumatology-Dr. Stacey Drain  Rheumatology-Dr. Stacey Drain   Imported By: Maryln Gottron 01/31/2010 13:34:31  _____________________________________________________________________  External Attachment:    Type:   Image     Comment:   External Document

## 2010-04-01 NOTE — Miscellaneous (Signed)
Summary: Living Will/from pt  Living Will/from pt   Imported By: Esmeralda Links D'jimraou 09/07/2007 15:29:14  _____________________________________________________________________  External Attachment:    Type:   Image     Comment:   External Document

## 2010-04-01 NOTE — Letter (Signed)
Summary: Out of Work  Adult nurse at [employer]  [address]   [place], [place] [address]   Phone: [phone]  Fax: [phone]    December 07, 2007   Employee:  TAMAR MIANO    To Whom It May Concern:   For Medical reasons, please excuse the above named employee from work for the following dates:  Start:   October 5 th 2009   End:     October 12 th 2009  If you need additional information, please feel free to contact our office.         Sincerely,    Madelin Headings MD

## 2010-04-01 NOTE — Progress Notes (Signed)
Summary: REFILL  Phone Note Refill Request Message from:  Fax from Pharmacy  Refills Requested: Medication #1:  VASOTEC 10 MG  TABS 1 by mouth once daily [facility]---[phone]  Initial call taken by: Warnell Forester,  April 25, 2009 9:30 AM  Follow-up for Phone Call        Rx sent to pharmacy. Follow-up by: Romualdo Bolk, CMA (AAMA),  April 25, 2009 11:50 AM    Prescriptions: VASOTEC 10 MG  TABS (ENALAPRIL MALEATE) 1 by mouth once daily  #90 x 0   Entered by:   Romualdo Bolk, CMA (AAMA)   Authorized by:   Madelin Headings MD   Signed by:   Romualdo Bolk, CMA (AAMA) on 04/25/2009   Method used:   Faxed to ...       [facility] ([facility]       [address] [place], [place]  [address]       Ph: [phone]       Fax: [phone]   RxID:   [id number]

## 2010-04-01 NOTE — Miscellaneous (Signed)
Summary: [facility]   Imported By: Maryln Gottron 04/23/2009 13:53:56  _____________________________________________________________________  External Attachment:    Type:   Image     Comment:   External Document

## 2010-04-01 NOTE — Progress Notes (Signed)
Summary: Rx Refill  Phone Note Refill Request Call back at Work Phone [phone] Call back at [phone] Message from:  Patient on December 31, 2009 2:32 PM  Refills Requested: Medication #1:  VASOTEC 10 MG  TABS 1 by mouth once daily Need 90 day supply send to [facility].  Normally has a years supply not sure why it was not done this year??   Method Requested: Electronic Initial call taken by: Trixie Dredge,  December 31, 2009 2:29 PM  Follow-up for Phone Call        Pt is due for a cpx. Okay 90 days supply only. Rx faxed to pharmacy. Follow-up by: Romualdo Bolk, CMA Duncan Dull),  December 31, 2009 2:38 PM  Additional Follow-up for Phone Call Additional follow up Details #1::        Pt aware and appt made. Additional Follow-up by: Romualdo Bolk, CMA (AAMA),  January 01, 2010 8:54 AM    Prescriptions: VASOTEC 10 MG  TABS (ENALAPRIL MALEATE) 1 by mouth once daily  #90 x 0   Entered by:   Romualdo Bolk, CMA (AAMA)   Authorized by:   Madelin Headings MD   Signed by:   Romualdo Bolk, CMA (AAMA) on 12/31/2009   Method used:   Faxed to ...       [facility]       [address] [place], [place]  [address]       Ph: [phone]       Fax: [phone]   RxID:   [id number]

## 2010-04-02 ENCOUNTER — Ambulatory Visit: Admit: 2010-04-02 | Payer: Self-pay | Admitting: Internal Medicine

## 2010-04-02 ENCOUNTER — Other Ambulatory Visit (INDEPENDENT_AMBULATORY_CARE_PROVIDER_SITE_OTHER): Payer: PRIVATE HEALTH INSURANCE | Admitting: Internal Medicine

## 2010-04-02 DIAGNOSIS — E785 Hyperlipidemia, unspecified: Secondary | ICD-10-CM

## 2010-04-02 DIAGNOSIS — T887XXA Unspecified adverse effect of drug or medicament, initial encounter: Secondary | ICD-10-CM

## 2010-04-02 LAB — HEPATIC FUNCTION PANEL
ALT: 25 U/L (ref 0–35)
AST: 23 U/L (ref 0–37)
Albumin: 4 g/dL (ref 3.5–5.2)
Alkaline Phosphatase: 70 U/L (ref 39–117)
Bilirubin, Direct: 0.1 mg/dL (ref 0.0–0.3)
Total Bilirubin: 0.6 mg/dL (ref 0.3–1.2)
Total Protein: 6.9 g/dL (ref 6.0–8.3)

## 2010-04-02 LAB — LIPID PANEL
Cholesterol: 201 mg/dL — ABNORMAL HIGH (ref 0–200)
HDL: 70.5 mg/dL (ref >39.00)
Total CHOL/HDL Ratio: 3
Triglycerides: 98 mg/dL (ref 0.0–149.0)
VLDL: 19.6 mg/dL (ref 0.0–40.0)

## 2010-04-02 LAB — LDL CHOLESTEROL, DIRECT: Direct LDL: 114 mg/dL

## 2010-04-03 NOTE — Assessment & Plan Note (Signed)
Summary: cpx/ssc   Vital Signs:  Patient profile:   60 year old female Menstrual status:  hysterectomy Height:      64.5 inches Weight:      208 pounds BMI:     35.28 Pulse rate:   78 / minute BP sitting:   120 / 80  (left arm) Cuff size:   regular  Vitals Entered By: Romualdo Bolk, CMA Duncan Dull) (February 19, 2010 11:56 AM)  Nutrition Counseling: Patient's BMI is greater than 25 and therefore counseled on weight management options. CC:  CPX- Pt has a gyn who does paps     Menstrual Status hysterectomy Last PAP Result normal   History of Present Illness: Jade Cardenas comes in today  for preventive visit .  She has had difficuties  with her  Joint problems  .   Pain   issues  with neck and  knees   Had gone back on and  tried mtx  and not helpful  at this time. like it was in the past . Has had shots i knees   taking prednisone as needed per Dr Kellie Simmering  wrist is an issues and wants    handicapped sticker.  she brings a copy of her MRI of her C-spine which shows multi-level disease a couple areas that could be an asset world injury and significant severe central canal stenosis at [facility] with some cord edema. She is to get a consult from a neurosurgeon. Dr Dutch Quint .  A brother has DISH and some ? if she has this.  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  She also has a bone density copy which shows T score of -0.7 spine and -1.2 hip.   is on HRT per gyne LIPIDS: taking krill  ? not has good no se . Sleep : taking excedrin pm as needed  taking tylenol xtraa strength 2  two times a day and asjks for a rx to  be covered by flex.  Dr Kellie Simmering monitoring this?     Allergy  : on clcraitin asks fro rx also . HT : No change in meds and nose noted     Preventive Care Screening  Bone Density:    Date:  01/03/2010    Results:  normal std dev  Pap Smear:    Date:  05/31/2009    Results:  normal   Mammogram:    Date:  04/30/2009  Results:  normal   Colonoscopy:    Date:  03/02/2006    Results:  normal   Prior Values:    Last Tetanus Booster:  Historical (05/09/2008)   Preventive Screening-Counseling & Management  Alcohol-Tobacco     Alcohol drinks/day: 0     Smoking Status: never  Caffeine-Diet-Exercise     Caffeine use/day: less than 1 a day     Does Patient Exercise: yes  Hep-HIV-STD-Contraception     Dental Visit-last 6 months yes     Sun Exposure-Excessive: no  Safety-Violence-Falls     Seat Belt Use: yes     Smoke Detectors: yes     Fall Risk: no falling   Current Medications (verified): 1)  Vivelle-Dot 0.0375 Mg/24hr  Pttw (Estradiol) .Marland Kitchen.. 1 By Mouth Once Daily 2)  Vasotec 10 Mg  Tabs (Enalapril Maleate) .Marland Kitchen.. 1 By Mouth Once Daily 3)  Accu-Chek Aviva   Strp (Glucose Blood) .... Check Blood Sugar As Directed 4)  Nexium 40 Mg Cpdr (  Esomeprazole Magnesium) .... Take 1 Tab Each Morning 5)  Multivitamins   Tabs (Multiple Vitamin) .... Once Daily 6)  Krill Oil 1000 Mg Caps (Krill Oil) 7)  Calcarb 600 1500 Mg Tabs (Calcium Carbonate) .... Once Daily 8)  Fluocinonide 0.05 % Oint (Fluocinonide) .... Apply To Rash Two Times A Day As Directed Not On Face 9)  Tylenol Arthritis Pain 650 Mg Cr-Tabs (Acetaminophen) 10)  Claritin 10 Mg Tabs (Loratadine) .Marland Kitchen.. 1 By Mouth Once Daily 11)  Advil 200 Mg Tabs (Ibuprofen) .... As Needed  Allergies (verified): 1)  ! Iodine 2)  * Mycins  Past History:  Past medical, surgical, family and social histories (including risk factors) reviewed, and no changes noted (except as noted below).  Past Medical History: Hyperlipidemia Hypertension Rheumatoid arthritis treatment  ? dx  DJD  hx of mtx rx  gastric ulcer bleeding from ibuprofen myoview 2/08 hypert response to exercise RBBB on ekg Colonic polyps, hx of IBS OSteopenia  DJD stenosis c spine [facility]  [facility] Nishan Medoff  Past Surgical History: Reviewed history from 04/28/2007 and no changes  required. Cholecystectomy Hysterectomy tah bso breast reduction back surgery synovial cyst  Past History:  Care Management: Rheumatology:  Trus[facility]  Gastroenterology: Medoff Gynecology:MIller  Cardiology: Eden Emms   Family History: Reviewed history from 04/28/2007 and no changes required. Family History of CAD Female 1st degree relative <50 Family History of Colon CA 1st degree relative <60   both parents Family History Hypertension Family History Diabetes 1st degree relative mom  Social History: Reviewed history from 12/18/2008 and no changes required. Never Smoked Working Metallurgist and organizing non porfits  Husband Died suddently 2010 pets with terminal disease .  Widow/Widower Caffeine use/day:  less than 1 a day Does Patient Exercise:  yes Fall Risk:  no falling  Sun Exposure-Excessive:  no Dental Care w/in 6 mos.:  yes Seat Belt Use:  yes  Review of Systems  The patient denies anorexia, fever, weight loss, weight gain, vision loss, decreased hearing, syncope, peripheral edema, melena, hematochezia, severe indigestion/heartburn, muscle weakness, transient blindness, depression, unusual weight change, abnormal bleeding, enlarged lymph nodes, and angioedema.    Physical Exam  General:  Well-developed,well-nourished,in no acute distress; alert,appropriate and cooperative throughout examination Head:  normocephalic and atraumatic.   Eyes:  PERRL, EOMs full, conjunctiva clear  Ears:  R ear normal, L ear normal, and no external deformities.   Nose:  no external deformity and no nasal discharge.   Mouth:  pharynx pink and moist.   Neck:  no masses or bruits   mild stiffness  on rom  Chest Wall:  kyhosis  upper chest  Breasts:  No mass, nodules, thickening, tenderness, bulging, retraction, inflamation, nipple discharge or skin changes noted.   Lungs:  Normal respiratory effort, chest expands symmetrically. Lungs are clear to auscultation, no crackles or wheezes. Heart:   Normal rate and regular rhythm. S1 and S2 normal without gallop, murmur, click, rub or other extra sounds.no lifts.   Abdomen:  Bowel sounds positive,abdomen soft and non-tender without masses, organomegaly or hernias noted. Genitalia:  per gyne Msk:  no joint warmth and no redness over joints.  some oa changes   no redness or warmth  Pulses:  pulses intact without delay   Extremities:  no clubbing cyanosis or edema  Neurologic:  alert & oriented X3.  gait normal and DTRs symmetrical and normal.   Skin:  turgor normal, color normal, no ecchymoses, and no petechiae.   Cervical Nodes:  No  lymphadenopathy noted Axillary Nodes:  No palpable lymphadenopathy Inguinal Nodes:  No significant adenopathy Psych:  Normal eye contact, appropriate affect. Cognition appears normal.  scans reports reviewed   Impression & Recommendations:  Problem # 1:  PREVENTIVE HEALTH CARE (ICD-V70.0) Discussed nutrition,exercise,diet,healthy weight, vitamin D and calcium.     Problem # 2:  ARTHRITIS (ICD-716.90) handicapped  sticker  given    some limitation has been treated for rheumatoid in the past with good response however on re  initiation of medications do not respond as well. She has some kyphosis and cervical spondylosis. There is some question about DIS H. She is taking Tylenol on a regular basis discussed dosing to avoid liver affects. Will write your prescription for such an monitor her liver test also.  Problem # 3:  HYPERTENSION (ICD-401.9) Her updated medication list for this problem includes:    Vasotec 10 Mg Tabs (Enalapril maleate) .Marland Kitchen... 1 by mouth once daily  BP today: 120/80 Prior BP: 130/80 (07/23/2009)  Prior 10 Yr Risk Heart Disease: Not enough information (02/09/2007)  Labs Reviewed: K+: 3.7 (02/06/2010) Creat: : 0.8 (02/06/2010)   Chol: 287 (02/06/2010)   HDL: 84.60 (02/06/2010)   LDL: DEL (04/21/2007)   TG: 111.0 (02/06/2010)  Problem # 4:  GERD (ICD-530.81)  Her updated medication  list for this problem includes:    Nexium 40 Mg Cpdr (Esomeprazole magnesium) .Marland Kitchen... Take 1 tab each morning  Problem # 5:  HYPERLIPIDEMIA (ICD-272.4)  under over-the-counter medications not controlled she had side effects with some statins will reinstitute lovastatin and recheck Her updated medication list for this problem includes:    Lovastatin 20 Mg Tabs (Lovastatin) .Marland Kitchen... 1 by mouth once daily  Her updated medication list for this problem includes:    Lovastatin 20 Mg Tabs (Lovastatin) .Marland Kitchen... 1 by mouth once daily  Labs Reviewed: SGOT: 17 (02/06/2010)   SGPT: 19 (02/06/2010)  Lipid Goals: Chol Goal: 200 (04/28/2007)   HDL Goal: 40 (04/28/2007)   LDL Goal: 160 (04/28/2007)   TG Goal: 150 (04/28/2007)  Prior 10 Yr Risk Heart Disease: Not enough information (02/09/2007)   HDL:84.60 (02/06/2010), 83.30 (04/25/2009)  LDL:DEL (04/21/2007)  Chol:287 (02/06/2010), 235 (04/25/2009)  Trig:111.0 (02/06/2010), 92.0 (04/25/2009)  Problem # 6:  ALLERGIC RHINITIS (ICD-477.9)  Her updated medication list for this problem includes:    Claritin 10 Mg Tabs (Loratadine) .Marland Kitchen... 1 by mouth once daily  Problem # 7:  UNSPECIFIED SLEEP DISTURBANCE (ICD-780.50) take care with otc preps to aboid  excess  tylenol dosing      Problem # 8:  OSTEOPENIA (ICD-733.90) nl vit d level.  Her updated medication list for this problem includes:    Calcarb 600 1500 Mg Tabs (Calcium carbonate) ..... Once daily  Complete Medication List: 1)  Vivelle-dot 0.0375 Mg/24hr Pttw (Estradiol) .Marland Kitchen.. 1 by mouth once daily 2)  Vasotec 10 Mg Tabs (Enalapril maleate) .Marland Kitchen.. 1 by mouth once daily 3)  Accu-chek Aviva Strp (Glucose blood) .... Check blood sugar as directed 4)  Nexium 40 Mg Cpdr (Esomeprazole magnesium) .... Take 1 tab each morning 5)  Multivitamins Tabs (Multiple vitamin) .... Once daily 6)  Krill Oil 1000 Mg Caps (Krill oil) 7)  Calcarb 600 1500 Mg Tabs (Calcium carbonate) .... Once daily 8)  Fluocinonide 0.05 %  Oint (Fluocinonide) .... Apply to rash two times a day as directed not on face 9)  Tylenol Arthritis Pain 650 Mg Cr-tabs (Acetaminophen) .Marland Kitchen.. 1-2 by mouth two times a day 10)  Claritin 10 Mg Tabs (  Loratadine) .Marland Kitchen.. 1 by mouth once daily 11)  Advil 200 Mg Tabs (Ibuprofen) .... As needed 12)  Lovastatin 20 Mg Tabs (Lovastatin) .Marland Kitchen.. 1 by mouth once daily  Patient Instructions: 1)  will send you    rx for  the OTc meds.  2)  restart the lovastatin  3)  check lipids LFTS in 6-8 weeks and then ov and decide whether to stay   on this.  Prescriptions: TYLENOL ARTHRITIS PAIN 650 MG CR-TABS (ACETAMINOPHEN) 1-2 by mouth two times a day  #120 x 6   Entered and Authorized by:   Madelin Headings MD   Signed by:   Madelin Headings MD on 02/25/2010   Method used:   Print then Give to Patient   RxID:   [id number] CLARITIN 10 MG TABS (LORATADINE) 1 by mouth once daily  #30 x 12   Entered and Authorized by:   Madelin Headings MD   Signed by:   Madelin Headings MD on 02/25/2010   Method used:   Print then Give to Patient   RxID:   [id number] LOVASTATIN 20 MG TABS (LOVASTATIN) 1 by mouth once daily  #30 x 4   Entered and Authorized by:   Madelin Headings MD   Signed by:   Madelin Headings MD on 02/19/2010   Method used:   Electronically to        [facility]       [address]       [place], [place]  [address]       Ph: [phone]       Fax: [phone]   RxID:   [id number]    Orders Added: 1)  Est. Patient 40-64 years [99396] 2)  Est. Patient Level III [99213]    Laboratory Results   Urine Tests    Routine Urinalysis   Color: yellow Appearance: Clear Glucose: negative   (Normal Range: Negative) Bilirubin: negative   (Normal Range: Negative) Ketone: negative   (Normal Range: Negative) Spec. Gravity: 1.015   (Normal Range: 1.003-1.035) Blood: negative   (Normal Range: Negative) pH: 6.0   (Normal Range: 5.0-8.0) Protein: negative    (Normal Range: Negative) Urobilinogen: 0.2   (Normal Range: 0-1) Nitrite: negative   (Normal Range: Negative) Leukocyte Esterace: negative   (Normal Range: Negative)    Comments: Rita Ohara  February 19, 2010 1:49 PM

## 2010-04-04 NOTE — Consult Note (Signed)
Summary: Dr Kellie Simmering note  Dr Kellie Simmering note   Imported By: Kassie Mends 08/03/2007 12:55:23  _____________________________________________________________________  External Attachment:    Type:   Image     Comment:   Dr Kellie Simmering note

## 2010-04-09 ENCOUNTER — Ambulatory Visit: Payer: Self-pay | Admitting: Internal Medicine

## 2010-04-09 NOTE — Letter (Signed)
Summary: Rheumatology-Dr. Stacey Drain  Rheumatology-Dr. Stacey Drain   Imported By: Maryln Gottron 03/31/2010 12:49:09  _____________________________________________________________________  External Attachment:    Type:   Image     Comment:   External Document

## 2010-04-16 ENCOUNTER — Other Ambulatory Visit (HOSPITAL_COMMUNITY): Payer: Self-pay | Admitting: Neurosurgery

## 2010-04-16 ENCOUNTER — Encounter (HOSPITAL_COMMUNITY)
Admission: RE | Admit: 2010-04-16 | Discharge: 2010-04-16 | Disposition: A | Discharge status: Home / Self Care | Payer: No Typology Code available for payment source | Source: Ambulatory Visit | Attending: Neurosurgery | Admitting: Neurosurgery

## 2010-04-16 ENCOUNTER — Ambulatory Visit (HOSPITAL_COMMUNITY)
Admission: RE | Admit: 2010-04-16 | Discharge: 2010-04-16 | Disposition: A | Discharge status: Home / Self Care | Payer: No Typology Code available for payment source | Source: Ambulatory Visit | Attending: Neurosurgery | Admitting: Neurosurgery

## 2010-04-16 DIAGNOSIS — M47812 Spondylosis without myelopathy or radiculopathy, cervical region: Secondary | ICD-10-CM

## 2010-04-16 DIAGNOSIS — Z01818 Encounter for other preprocedural examination: Secondary | ICD-10-CM | POA: Insufficient documentation

## 2010-04-16 LAB — DIFFERENTIAL
Basophils Absolute: 0 10*3/uL (ref 0.0–0.1)
Basophils Relative: 1 % (ref 0–1)
Eosinophils Absolute: 0.1 10*3/uL (ref 0.0–0.7)
Eosinophils Relative: 1 % (ref 0–5)
Lymphocytes Relative: 32 % (ref 12–46)
Lymphs Abs: 2.6 10*3/uL (ref 0.7–4.0)
Monocytes Absolute: 0.6 10*3/uL (ref 0.1–1.0)
Monocytes Relative: 7 % (ref 3–12)
Neutro Abs: 4.9 10*3/uL (ref 1.7–7.7)
Neutrophils Relative %: 60 % (ref 43–77)

## 2010-04-16 LAB — BASIC METABOLIC PANEL
BUN: 20 mg/dL (ref 6–23)
CO2: 28 mEq/L (ref 19–32)
Calcium: 10 mg/dL (ref 8.4–10.5)
Chloride: 104 mEq/L (ref 96–112)
Creatinine, Ser: 0.85 mg/dL (ref 0.4–1.2)
GFR calc Af Amer: >60 mL/min (ref >60)
GFR calc non Af Amer: >60 mL/min (ref >60)
Glucose, Bld: 106 mg/dL — ABNORMAL HIGH (ref 70–99)
Potassium: 4.5 mEq/L (ref 3.5–5.1)
Sodium: 141 mEq/L (ref 135–145)

## 2010-04-16 LAB — CBC
HCT: 38.4 % (ref 36.0–46.0)
Hemoglobin: 12.9 g/dL (ref 12.0–15.0)
MCH: 29.6 pg (ref 26.0–34.0)
MCHC: 33.6 g/dL (ref 30.0–36.0)
MCV: 88.1 fL (ref 78.0–100.0)
Platelets: 373 10*3/uL (ref 150–400)
RBC: 4.36 MIL/uL (ref 3.87–5.11)
RDW: 13 % (ref 11.5–15.5)
WBC: 8.1 10*3/uL (ref 4.0–10.5)

## 2010-04-16 LAB — SURGICAL PCR SCREEN
MRSA, PCR: NEGATIVE
Staphylococcus aureus: POSITIVE — AB

## 2010-04-22 ENCOUNTER — Observation Stay (HOSPITAL_COMMUNITY): Payer: No Typology Code available for payment source

## 2010-04-22 ENCOUNTER — Ambulatory Visit (HOSPITAL_COMMUNITY)
Admission: RE | Admit: 2010-04-22 | Discharge: 2010-04-23 | DRG: 473 | Disposition: A | Discharge status: Home / Self Care | Payer: No Typology Code available for payment source | Source: Ambulatory Visit | Attending: Neurosurgery | Admitting: Neurosurgery

## 2010-04-22 DIAGNOSIS — I10 Essential (primary) hypertension: Secondary | ICD-10-CM | POA: Diagnosis present

## 2010-04-22 DIAGNOSIS — Z79899 Other long term (current) drug therapy: Secondary | ICD-10-CM | POA: Insufficient documentation

## 2010-04-22 DIAGNOSIS — E669 Obesity, unspecified: Secondary | ICD-10-CM | POA: Insufficient documentation

## 2010-04-22 DIAGNOSIS — Z01818 Encounter for other preprocedural examination: Secondary | ICD-10-CM | POA: Insufficient documentation

## 2010-04-22 DIAGNOSIS — Z7982 Long term (current) use of aspirin: Secondary | ICD-10-CM | POA: Insufficient documentation

## 2010-04-22 DIAGNOSIS — K219 Gastro-esophageal reflux disease without esophagitis: Secondary | ICD-10-CM | POA: Diagnosis present

## 2010-04-22 DIAGNOSIS — Z01810 Encounter for preprocedural cardiovascular examination: Secondary | ICD-10-CM | POA: Insufficient documentation

## 2010-04-22 DIAGNOSIS — M4712 Other spondylosis with myelopathy, cervical region: Secondary | ICD-10-CM | POA: Diagnosis present

## 2010-04-22 DIAGNOSIS — Z01812 Encounter for preprocedural laboratory examination: Secondary | ICD-10-CM | POA: Insufficient documentation

## 2010-04-22 LAB — TYPE AND SCREEN
ABO/RH(D): B POS
Antibody Screen: NEGATIVE

## 2010-04-22 LAB — ABO/RH: ABO/RH(D): B POS

## 2010-05-07 ENCOUNTER — Ambulatory Visit (INDEPENDENT_AMBULATORY_CARE_PROVIDER_SITE_OTHER): Payer: No Typology Code available for payment source | Admitting: Internal Medicine

## 2010-05-07 ENCOUNTER — Encounter: Payer: Self-pay | Admitting: Internal Medicine

## 2010-05-07 VITALS — BP 120/80 | HR 60 | Ht 64.25 in | Wt 206.0 lb

## 2010-05-07 DIAGNOSIS — M129 Arthropathy, unspecified: Secondary | ICD-10-CM

## 2010-05-07 DIAGNOSIS — K589 Irritable bowel syndrome without diarrhea: Secondary | ICD-10-CM

## 2010-05-07 DIAGNOSIS — M4802 Spinal stenosis, cervical region: Secondary | ICD-10-CM

## 2010-05-07 DIAGNOSIS — E785 Hyperlipidemia, unspecified: Secondary | ICD-10-CM

## 2010-05-07 DIAGNOSIS — I10 Essential (primary) hypertension: Secondary | ICD-10-CM

## 2010-05-07 MED ORDER — ENALAPRIL MALEATE 10 MG PO TABS: 10.0000 mg | ORAL_TABLET | Freq: Every day | ORAL | Status: DC

## 2010-05-07 MED ORDER — LOVASTATIN 20 MG PO TABS: 20.0000 mg | ORAL_TABLET | Freq: Every day | ORAL | Status: DC

## 2010-05-07 NOTE — Assessment & Plan Note (Addendum)
Sp  Cervical surgery  Affecting left arm.    Had discectomy and fusion    Arm is better.    But now has right numbness.

## 2010-05-07 NOTE — Patient Instructions (Addendum)
Continue medication   Call if worried about any side effect.  return office visit in  6 months  Or as needed. Minimize aleve as needed. Because of  Gi risk and interference with Bp meds.

## 2010-05-07 NOTE — Assessment & Plan Note (Addendum)
Good response to meds and no se noted .  To continue.    Had cramps in hospital from lipitor in hospital.

## 2010-05-07 NOTE — Progress Notes (Signed)
  Subjective:    Patient ID: Jade Cardenas, female    DOB: 1950-09-12, 60 y.o.   MRN: [id number]  HPI Pt comes in for follow up of  multiple medical issues . She had her cervical   spine surgery and had some se of anesthesia with  Fatigue and hoarseness.  Getting much better.  HT controlled and needs refill of meds. LIPID:  No se of lovastatin      Labs pending.  Poss cramps from lipitor  In hospital.    Review of Systems No cp s ob no  Fever  Cough  Some fatigue. Still some numbnes right  Check lump left arm     Objective:   Physical Exam WDWn iin nad minimally hoarse  Chest cta  CV rr  Left arm with 5 cm /lipoma soft  No redness some tenderness at area.  Labs ; see results improved .        Assessment & Plan:  LIPIDS better   On meds no se  HT ok to refill meds Cervical djd  Poss lipoma   Monitor and recheck of progressive.  IBS  Some flare with surgery      Counseled. About return to work and to   Go  Part time  At first.    More than 50% of visit  Was spent in counseling  25 minutes

## 2010-05-08 NOTE — Op Note (Signed)
Jade Cardenas, Jade Cardenas             ACCOUNT NO.:  [id number]  MEDICAL RECORD NO.:  [id number]           PATIENT TYPE:  I  LOCATION:  3528                         FACILITY:  [facility]  PHYSICIAN:  Glenyce Randle A. Joelie Schou, M.D.    DATE OF BIRTH:  22-Nov-1950  DATE OF PROCEDURE:  04/22/2010 DATE OF DISCHARGE:                              OPERATIVE REPORT   SERVICE:  Neurosurgery.  PREOPERATIVE DIAGNOSES:  C3-4 stenosis with myelopathy, C5-6 spondylosis with radiculopathy.  POSTOPERATIVE DIAGNOSES:  C3-4 stenosis with myelopathy, C5-6 spondylosis with radiculopathy.  PROCEDURE NOTE:  C3-4 anterior cervical diskectomy and fusion, allograft and plating.  C5-6 anterior cervical diskectomy and fusion with anterior plating.  SURGEON:  Kathaleen Maser. Almendra Loria, MD  ASSISTANT:  Reinaldo Meeker, MD  ANESTHESIA:  General endotracheal.  INDICATION:  Ms. Behlke is a 60 year old female with a history of neck and upper extremity pain consistent with cervical radiculopathy. Workup demonstrates evidence of marked cervical stenosis with spinal cord signal changes at C3-4.  At C5-6, the patient has an unusual spondylitic protrusion extending to the left side behind the body of C5 causing compression of the left spinal cord and exiting C6 nerve root. This is not a classic spondylitic protrusion and appears almost as if it is a synovial cyst emanating from the facet joint complex.  The patient is counseled as to her options.  She decided to proceed with two-level anterior cervical decompression and fusion in hopes of improving her symptoms.  OPERATION NOTE:  The patient was placed on the operating room table in supine position and adequate level of anesthesia was achieved, the patient was supine with neck slightly extended, held in place with halter traction.  The patient's anterior cervical region was prepped and draped sterilely.  A 10 blade was used to make a curvilinear skin incision overlying the C4-5 level.   This was carried down sharply to the platysma.  The platysma was then divided vertically and dissection proceeded along the medial border of the sternocleidomastoid muscle and carotid sheath.  Trachea and esophagus were mobilized, tracked towards the left.  Prevertebral fascia was stripped off the anterior spinal column.  Longus coli muscle was elevated bilaterally using electrocautery.  Deep self-retaining retractor was placed. Intraoperative fluoroscopy views and levels were confirmed.  Disk space at C3-C4 and C5-6 were both then incised with 15 blade in a rectangular fashion.  A wide space clean-out was achieved using pituitary rongeurs, forward and backward Karlin curettes, Kerrison rongeurs, and high-speed drill.  Elements of the disk removed down to the level of the posterior annulus.  Microscope was then brought into the field and used throughout the remainder of the diskectomy.  The remaining aspects of annulus and osteophytes were removed using high-speed drill down to the level of the posterior longitudinal ligament.  The posterior longitudinal ligament was then elevated and resected in a piecemeal fashion using Kerrison rongeurs.  The underlying thecal sac was then identified.  Wide central decompression then was performed by undercutting the bodies of C3 and C45.  Decompression was then proceeded out to the edges of the neural foramen.  Wide anterior foraminotomies were  then performed along the course of the exiting C5 nerve roots bilaterally.  At this point, a very thorough decompression was achieved.  There was no evidence of injury to the thecal sac or nerve roots.  The procedure then repeated at C5-6 again without complication.  Also at the left side of C5-6, a large bony protrusion was dissected free within the axilla of the C6 nerve root. This was dissected free and resected using pituitary rongeur.  This fully decompressed the spinal cord and the exiting C6 nerve root.   A 7- mm cornerstone allograft wedge was then packed at both levels and both recessed approximately 1 mm from the anterior cortical margin.  Atlantis anterior cervical plate was placed and placed over the C3-4 and C5-6 levels.  This attached under fluoroscopic guidance using 30 mm variable screws to each at both levels.  All four screws given final tightening and found to be solidly within bone.  Locking screws engaged at both levels.  Final images revealed good position of the bone grafts, hardware at proper operative level with normal alignment of spine.  The wound was then irrigated with antibiotic solution.  Gelfoam was placed topically for hemostasis as needed.  Wound was then closed in typical fashion.  Steri-Strips and sterile dressings were applied.  There were no complications.  The patient tolerated the procedure well, and she returns to the recovery room postoperatively.          ______________________________ Kathaleen Maser Nadalyn Deringer, M.D.     HAP/MEDQ  D:  04/22/2010  T:  04/23/2010  Job:  [id number]  Electronically Signed by Julio Sicks M.D. on 05/07/2010 11:21:46 PM

## 2010-05-30 ENCOUNTER — Other Ambulatory Visit: Payer: Self-pay | Admitting: Obstetrics & Gynecology

## 2010-05-30 DIAGNOSIS — Z1231 Encounter for screening mammogram for malignant neoplasm of breast: Secondary | ICD-10-CM

## 2010-06-03 ENCOUNTER — Ambulatory Visit
Admission: RE | Admit: 2010-06-03 | Discharge: 2010-06-03 | Disposition: A | Discharge status: Home / Self Care | Payer: No Typology Code available for payment source | Source: Ambulatory Visit | Attending: Obstetrics & Gynecology | Admitting: Obstetrics & Gynecology

## 2010-06-03 DIAGNOSIS — Z1231 Encounter for screening mammogram for malignant neoplasm of breast: Secondary | ICD-10-CM

## 2010-06-12 ENCOUNTER — Other Ambulatory Visit: Payer: Self-pay | Admitting: Neurosurgery

## 2010-06-12 ENCOUNTER — Ambulatory Visit
Admission: RE | Admit: 2010-06-12 | Discharge: 2010-06-12 | Disposition: A | Discharge status: Home / Self Care | Payer: No Typology Code available for payment source | Source: Ambulatory Visit | Attending: Neurosurgery | Admitting: Neurosurgery

## 2010-06-12 DIAGNOSIS — M542 Cervicalgia: Secondary | ICD-10-CM

## 2010-07-15 NOTE — Assessment & Plan Note (Signed)
[facility]                            CARDIOLOGY OFFICE NOTE   JUELZ, CLAAR                    MRN:          [id number]  DATE:12/12/2007                            DOB:          Jun 13, 1950    A 60 year old patient referred back by Dr. Fabian Sharp for syncope.   Sabree is a 60 year old patient I have previously seen back in 2008.  She  has coronary risk factors which include hypertension and  hypercholesterolemia.  She was initially seen back then for new right  bundle-branch block which I thought was benign.  She had an echo with  normal LV function and no significant valvular heart disease.   I have taken care of Mililani's husband, Gerlene Burdock, for a long time.  Unfortunately, he passed away this year.  He had acute renal failure and  had a drug addiction problem.   At the beginning of October, Shaquita had a syncopal spell.  She has been  doing fine running around doing errands.  She was at the dog room where  she had been holding one of her miniature schnauzers for about 20  minutes and felt diaphoretic and woozy.  It appeared that she had a  vagal reaction.  She went to the emergency room, workup there was  negative.  There is no arrhythmia and no significant lab abnormalities.  The patient has not had any recurrences.  There was no inciting factor.  There was no chest pain, no vertigo, no palpitations.   The patient has otherwise been doing well.  She tends to stay very busy.  I think she is a bit rundown.  Her life has changed a lot since [place]  died.  He was extremely difficult to deal with and had quite a volatile  relationship despite the fact that they loved each other.   Her past medical history is otherwise remarkable for hypertension and  hypercholesterolemia.   She has had bone spurs, Achilles heel problems, synovial cyst in her  back.   She is allergic to INDOCIN, ERYTHROMYCIN, and CODEINE.   Her meds include:  1. Calcium.  2.  Fish oil.  3. Lovastatin 40 a day.  4. Nexium 40 a day.  5. Enalapril 5 b.i.d.  6. Multivitamins.   As indicated, she is recently widowed.  She does a lot of work at [employer].  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  She drinks a cup of coffee a  day.  Does not smoke or drink.   Her family history is noncontributory.   PHYSICAL EXAMINATION:  GENERAL:  Her current exam is remarkable for  healthy-appearing, middle-aged white female who is very talkative.  Affect is appropriate.  VITAL SIGNS:  Weight is 185, blood pressure 122/80, she is not postural,  pulse 18 and regular, respiratory 14 and afebrile.  HEENT:  Unremarkable.  NECK:  Carotids normal without bruit.  No lymphadenopathy, thyromegaly,  or JVP elevation.  LUNGS:  Clear.  Good diaphragmatic motion.  No wheezing.  S1 and S2.  Normal heart sounds.  PMI normal.  ABDOMEN:  Benign.  Bowel sounds positive.  No AAA.  No bruit.  No  hepatosplenomegaly or hepatojugular reflux.  No tenderness.  No bruits.  EXTREMITIES:  Distal pulses are intact.  No edema.  NEUROLOGIC:  Nonfocal.  SKIN:  Warm and dry.  MUSCULOSKELETAL:  No muscular weakness.   EKG is normal.   IMPRESSION:  1. Syncopal episode sounds like benign vasovagal reaction.  Continue      to observe.  No indication for further workup.  2. Hypertension.  Somewhat labile, but in a good range.  Continue      enalapril 5 b.i.d.  No indication to add therapy.  Continue low-      salt diet.  3. Hypercholesterolemia.  Continue lovastatin 40 a day.  Lipid and      liver profile in 6 months.  4. Recent widow with grief reaction, currently appears stable.  No      indication for selective serotonin reuptake inhibitor at the time.  5. History of reflux.  Continue Nexium.  Currently under good control.  6. History of right bundle-branch  block.  No longer present on EKG      today.  Conduction system appears intact.   I will see Lennix back in 6 months.     Noralyn Pick. Eden Emms, MD, [facility]  Electronically Signed    PCN/MedQ  DD: 12/12/2007  DT: 12/13/2007  Job #: [id number]   cc:   [name]. Fabian Sharp, MD

## 2010-07-18 NOTE — Assessment & Plan Note (Signed)
[facility]                            CARDIOLOGY OFFICE NOTE   CORDELL, COKE                    MRN:          [id number]  DATE:03/16/2006                            DOB:          20-Nov-1950    Ms. Chopp is a 60 year old patient referred by Dr. Kinnie Scales and Dr.  Fabian Sharp.   I actually know Mariafernanda quite well as I have cared for her husband for  quite some time.  He has coronary artery disease and status post CABG.   Milderd has been in research trials for her irritable bowel syndrome with  Dr. Kinnie Scales over the last few years.   She recently completed a research trial which included EKGs.  She  developed an EKG abnormality and was referred here.  In review of her  EKG, she previously had a totally normal EKG.  When her EKG January 9  was checked, she had a new right bundle branch block.   The patient on review of systems has had occasional palpitations.  She  tends to get this when she is upset or when she has pain either in her  back or from her irritable bowel syndrome.   She has never had syncope, PND or orthopnea.  There is no history of  DVTs.   She has not had a previous stress test or echo.   The patient has never been told that she has a heart murmur or abnormal  EKG in the past.   In reviewing the EKG, it is not a rate-related bundle branch block.   Her heart rate at the time of the EKG with right bundle branch block was  only 70.   The patient's coronary risk factors include hypertension,  hypercholesterolemia.  The patient's review of systems otherwise  negative.  In particular, she has not had syncope, chest pain, TIAs,  CVAs.   ALLERGIES:  The patient is allergic to INDOCIN, ERYTHROMYCIN, CODEINES  and some sort of DYE.  She does not have a shellfish allergy.   She drinks a cup of coffee a day.   PAST SURGICAL HISTORY:  Remarkable for bone spurs in her Achilles and  synovial cyst in the back.   She eats a fairly  healthy diet.  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   She does not exercise on a regular basis.  She has had some marital  difficulties with her husband who abuses pain killers.   PAST MEDICAL HISTORY:  Otherwise remarkable for some problems with  irritable bowel syndrome and ulcers.   FAMILY HISTORY:  Her mother died at age 5 of colon cancer, father died  of complications from a ruptured colon at age 76.   PHYSICAL EXAMINATION:  Blood pressure is 128/70, pulse is 70 and  regular.  HEENT:  Normal.  Carotids are normal.  LUNGS:  Clear, with no wheezing.  CARDIAC:  There is an S1, S2, normal heart sounds.  ABDOMEN:  Benign.  LOWER EXTREMITIES:  Intact pulses, no edema.  NEURO:  Nonfocal.   IMPRESSION:  Palpitations with right bundle branch block.  CURRENT MEDICATIONS:  1. Methotrexate 2.5 mg a day.  2. Zegerid 40 mg a day.  3. Enalapril 5 mg a day.  4. Lovastatin 40 a day.   The patient does have coronary risk factors.  She has been having  palpitations and has an abnormal EKG.  I think it is reasonable to do a  stress Myoview on her.  At the time of her exercise, we will also see if  the right bundle branch block persists.   In general, right bundle branch block tends to be a benign condition,  however hers is clearly not rate related and it is a new change for her.   In regards to her palpitations, I think a 2D echocardiogram would be in  order to rule out mitral valve prolapse or other structural heart  disease.  Again, these appear to be benign and related to stress and  elevated adrenaline levels.   Overall, I think Bijal will turn out to have a structurally-normal heart  and have a benign right bundle branch block.  I explained the difference  to her between right and left bundle branch blocks and the significance  in regards to underlying heart disease.  She seemed relieved today to  know this.  We will call Dr. Jennye Boroughs office and try  to see exactly what  research trial she was in as this is a little bit unusual to have to  check EKGs this frequently.   If the conduction abnormality was related to drug it should go away  since the patient has been off research drugs for about a month.     Noralyn Pick. Eden Emms, MD, [facility]  Electronically Signed    PCN/MedQ  DD: 03/16/2006  DT: 03/16/2006  Job #: [id number]   cc:   Griffith Citron, M.D.

## 2010-07-18 NOTE — Op Note (Signed)
[place]  Patient:    Jade Cardenas, Jade Cardenas                    MRN: [id number] Proc. Date: 06/25/99 Adm. Date:  [id number] Disc. Date: [id number] Attending:  Donn Pierini                           Operative Report  PREOPERATIVE DIAGNOSIS:  Right lumbar vertebrae-4/5 spondylosis with synovial cyst causing a right lumbar vertebrae-4/5 stenosis with radiculopathy.  POSTOPERATIVE DIAGNOSIS:  Right lumbar vertebrae-4/5 spondylosis with synovial cyst causing a right lumbar vertebrae-4/5 stenosis with radiculopathy.  OPERATION:  Right L4/5 decompressive laminotomy with resection of synovial cyst, microdissection.  SURGEON:  Julio Sicks, M.D.  ASSISTANT:  Reinaldo Meeker, M.D.  ANESTHESIA:  General endotracheal anesthesia.  INDICATION:  Jade Cardenas is a 60 year old female with history of back and right lower extremity pain, paresthesias and weakness consistent with a right-sided L5 radiculopathy which has failed conservative management.  MRI scan demonstrates severe stenosis at L4/5 secondary to spondylosis and associated right-sided L4/5 synovial cyst.  The patient has been counselled as to her options.  She has decided to proceed with a right-sided decompressive laminotomy with resection of the synovial cyst.  She is aware that there is a significant rate of recurrence of the synovial cyst and that she may later require fusion.  Currently, her disc space is well maintained.  There is no evidence of degenerative disc disease. There is no other overt evidence of instability.  It is not felt that she needs fusion primarily for this problem.  DESCRIPTION OF PROCEDURE:  The patient is taken to the operating room, placed on the table in supine position.  After anesthesia achieved, the patient positioned prone on to Wilson frame, appropriately padded.  Lumbar region was shaved and prepped sterilely.  A 10 blade was used to make a linear skin  incision overlying the L4/5 interspace.  This was carried down sharply in the midline.  Subperiosteal dissection was performed exposing the lamina and facet joints of L4 and L5. Intraoperative x-ray was taken confirming the L4/5 interspace.  A decompressive laminotomy was then performed using a high speed drill and Kerrison rongeurs to move the inferior one-half of the lamina of L4, the medial edge of the L4/5 facet joint, and the superior one-third of the lamina of L5.  Ligamentum flavum was then elevated and resected in a piecemeal fashion using Kerrison rongeurs.  Microscope was brought into the field and used for microdissection of the right-sided L4/5 synovial cyst underlying right-sided L5 nerve root.  The synovial cyst was dissected free off the underlying nerve root.  It was then resected in a piecemeal fashion using Kerrison rongeurs.  This completely decompressed the right-sided L5 nerve root.  After a very thorough decompression and foraminotomy had been performed, the wound was inspected.  There is no evidence of injury to thecal sac or nerve roots.  Blunt probe was passed along easily along the course of the L4/5 nerve roots.  The wound was then copiously irrigated with antibiotic solution. Dressing was placed topically for hemostasis which was found to be good. Microscope and retraction instruments were removed.  Hemostasis of the muscle achieved with electrocautery.  The wound was then closed in layers with Vicryl sutures.  Staples applied to the surface.  A sterile dressing was applied. There were no apparent complications.  The patient tolerated the procedure  well and returned to the recovery room postoperative. DD:  07/25/99 TD:  07/29/99 Job: [id number]

## 2010-07-18 NOTE — Op Note (Signed)
[name], Jade Cardenas             ACCOUNT NO.:  [id number]   MEDICAL RECORD NO.:  [id number]          PATIENT TYPE:  OBV   LOCATION:  0098                         FACILITY[facility]  [facility]   PHYSICIAN:  Marlowe Kays, M.D.  DATE OF BIRTH:  12/15/1950   DATE OF PROCEDURE:  09/11/2004  DATE OF DISCHARGE:                                 OPERATIVE REPORT   PREOPERATIVE DIAGNOSIS:  Painful right posterior os calcis heel spur with  secondary Achilles tendinitis.   POSTOPERATIVE DIAGNOSIS:  Painful right posterior os calcis heel spur with  secondary Achilles tendinitis.   OPERATION:  Removal of posterior right os calcis spur.   SURGEON:  Marlowe Kays, M.D.   ASSISTANT:  Nurse.   ANESTHESIA:  General.   INDICATIONS FOR PROCEDURE:  She has rheumatoid arthritis.  I saw her in the  office about a month ago with severe right heel pain with a posterior heel  spur noted on x-ray, 7.9 mm in length.  We have tried her with  immobilization with a cane and walker without relief.  Consequently, she is  here today for the above-mentioned excision.  This will be followed by a  single dose of radiation tomorrow to try and minimize recurrence.  See  operative description below for additional details of pathology.   PROCEDURE:  Satisfactory general anesthesia.  Pneumatic tourniquet.  The leg  was Esmarched out nonsterilely and prepped from the mid calf to the toes  with DuraPrep and draped in a sterile field.  Made a curved hockey-stick-  type incision medial to the Achilles tendon going down over the posterior os  calcis area.  I used the C-arm to localize on the lateral x-ray the location  of the spur and then undermined the os calcis at this point with a knife and  periosteal dissection.  The medial portion of the spur appeared to be  actually calcium in the tendon.  I kept debriding out tendon, which was  gritty, until the radiographic abnormality had returned to normal.  It was  then irrigated  with sterile saline.  The wound was infiltrated with 0.5%  plain Marcaine.  I reattached the partially detached Achilles tendon to the  adjacent periosteum with interrupted #1 Vicryl, the subcutaneous tissue with  4-0 Vicryl, and the skin with interrupted 4-0 nylon mattress sutures.  Betadine, Adaptic, and dry sterile dressing with a well-padded short-leg  splint cast was applied.  She tolerated the procedure well and was taken to  the recovery room in satisfactory condition with no known complications.       JA/MEDQ  D:  09/11/2004  T:  09/11/2004  Job:  [id number]

## 2010-08-13 ENCOUNTER — Other Ambulatory Visit: Payer: Self-pay | Admitting: Neurosurgery

## 2010-08-13 DIAGNOSIS — M542 Cervicalgia: Secondary | ICD-10-CM

## 2010-08-15 ENCOUNTER — Ambulatory Visit
Admission: RE | Admit: 2010-08-15 | Discharge: 2010-08-15 | Disposition: A | Discharge status: Home / Self Care | Payer: No Typology Code available for payment source | Source: Ambulatory Visit | Attending: Neurosurgery | Admitting: Neurosurgery

## 2010-08-15 DIAGNOSIS — M542 Cervicalgia: Secondary | ICD-10-CM

## 2010-10-29 ENCOUNTER — Ambulatory Visit
Admission: RE | Admit: 2010-10-29 | Discharge: 2010-10-29 | Disposition: A | Discharge status: Home / Self Care | Payer: No Typology Code available for payment source | Source: Ambulatory Visit | Attending: Neurosurgery | Admitting: Neurosurgery

## 2010-10-29 ENCOUNTER — Other Ambulatory Visit: Payer: Self-pay | Admitting: Neurosurgery

## 2010-10-29 DIAGNOSIS — M542 Cervicalgia: Secondary | ICD-10-CM

## 2010-12-01 LAB — POCT I-STAT, CHEM 8
BUN: 19
Calcium, Ion: 1.11 — ABNORMAL LOW
Chloride: 108
Creatinine, Ser: 1
Glucose, Bld: 119 — ABNORMAL HIGH
HCT: 40
Hemoglobin: 13.6
Potassium: 4.2
Sodium: 140
TCO2: 25

## 2010-12-01 LAB — URINALYSIS, ROUTINE W REFLEX MICROSCOPIC
Bilirubin Urine: NEGATIVE
Glucose, UA: NEGATIVE
Hgb urine dipstick: NEGATIVE
Ketones, ur: NEGATIVE
Nitrite: NEGATIVE
Protein, ur: NEGATIVE
Specific Gravity, Urine: 1.02
Urobilinogen, UA: 0.2
pH: 8

## 2011-01-06 ENCOUNTER — Encounter: Payer: Self-pay | Admitting: Internal Medicine

## 2011-01-06 ENCOUNTER — Ambulatory Visit (INDEPENDENT_AMBULATORY_CARE_PROVIDER_SITE_OTHER): Payer: PRIVATE HEALTH INSURANCE | Admitting: Internal Medicine

## 2011-01-06 VITALS — BP 122/70 | Temp 97.8°F | Wt 209.0 lb

## 2011-01-06 DIAGNOSIS — I10 Essential (primary) hypertension: Secondary | ICD-10-CM

## 2011-01-06 DIAGNOSIS — Z7189 Other specified counseling: Secondary | ICD-10-CM

## 2011-01-06 DIAGNOSIS — R350 Frequency of micturition: Secondary | ICD-10-CM

## 2011-01-06 LAB — POCT URINALYSIS DIPSTICK
Bilirubin, UA: NEGATIVE
Glucose, UA: NEGATIVE
Ketones, UA: NEGATIVE
Leukocytes, UA: NEGATIVE
Nitrite, UA: NEGATIVE
Protein, UA: NEGATIVE
Spec Grav, UA: 1.02
Urobilinogen, UA: 0.2
pH, UA: 6.5

## 2011-01-06 MED ORDER — NITROFURANTOIN MONOHYD MACRO 100 MG PO CAPS: 100.0000 mg | ORAL_CAPSULE | Freq: Two times a day (BID) | ORAL | Status: AC

## 2011-01-06 NOTE — Progress Notes (Signed)
  Subjective:    Patient ID: LOUAN BASE, female    DOB: [birth year], 60 y.o.   MRN: [id number]  HPI  Patient comes in today for SDA  For acute problem evaluation. Ask to come as a work in  appt  Has had  About 24 hours of increase frequency and urgency with low back pain but  No fever , chills   no abd pain but has low abd pressure. No   New gi issues   Asks about  Immuniz.  zostavax     Review of Systems As per  hpin no fever rash syncope change bowel habits   Past history family history social history reviewed in the electronic medical record.     Objective:   Physical Exam wdwn in nad kyphosis some OA changes Chest:  Clear to A&P without wheezes rales or rhonchi CV:  S1-S2 no gallops or murmurs peripheral perfusion is normal Abdomen:  Soft normal bowel sounds without hepatosplenomegaly, no guarding rebound or masses no CVA tenderness some mild sp discomfort.  No clubbing cyanosis or edema  ua 1+ blood   ucx planned Reviewed ehr no recent ut  Disc  zostavax and ok to give  Check into insurance Total visit > 50% spent counseling and coordinating care      Assessment & Plan:  Urinary frequency  Abnormal ua   Poss uti with hx of same Begin med and call with results   Immuniz ?   Disc  zostavax and ok to give  Check into insurance  Counseled.  Not on immunosuppressive meds  Ok to recieve Total visit > 50% spent counseling and coordinating care

## 2011-01-06 NOTE — Patient Instructions (Signed)
Will notify you  of labs culture  when available. Antibiotic   And call if needed Can call if want zostavax  .

## 2011-01-08 LAB — URINE CULTURE: Colony Count: 60000

## 2011-01-14 ENCOUNTER — Telehealth: Payer: Self-pay | Admitting: Internal Medicine

## 2011-01-14 DIAGNOSIS — N390 Urinary tract infection, site not specified: Secondary | ICD-10-CM

## 2011-01-14 MED ORDER — CIPROFLOXACIN HCL 500 MG PO TABS: 500.0000 mg | ORAL_TABLET | Freq: Two times a day (BID) | ORAL | Status: AC

## 2011-01-14 NOTE — Telephone Encounter (Signed)
This may not be urinary at all but since she improved on the antibiotic  And had blood in urine will retreat and then further eval if     persistent or progressive

## 2011-01-14 NOTE — Telephone Encounter (Signed)
Please let us know what you need to know about this pt and her migraines?

## 2011-01-14 NOTE — Telephone Encounter (Signed)
I spoke to pt and she is still having the same UTI symptoms, and would like another antibiotic.  Headache is better.

## 2011-01-14 NOTE — Telephone Encounter (Signed)
Pt is having pressure and really bad back pain.   Per Dr. Fabian Sharp- have call in cipro 500mg  1 bid x 3 days and do referral to urology. Pt okay with this. Rx and urology referral sent.

## 2011-01-14 NOTE — Telephone Encounter (Signed)
Pt called and said that she finished the nitrofurantoin, macrocrystal-monohydrate, (MACROBID) 100 MG capsule, but it gave pt headaches and pts symptoms returned today. Pt req different abx to be called in to [facility] at [place].

## 2011-01-14 NOTE — Telephone Encounter (Signed)
Please get more info on what is going on.

## 2011-01-16 ENCOUNTER — Telehealth: Payer: Self-pay | Admitting: Family Medicine

## 2011-01-16 NOTE — Telephone Encounter (Signed)
Pt would like shannon to return her call. Pt can not see urologist until 01-20-11 and med is not working.

## 2011-01-16 NOTE — Telephone Encounter (Signed)
Left message to call back  

## 2011-01-16 NOTE — Telephone Encounter (Signed)
Spoke to pt- cipro is not working to help with the pressure or back pain. Pt is wanting something Septra called in.

## 2011-01-16 NOTE — Telephone Encounter (Signed)
Per Dr. Fabian Sharp- Have pt go to the [facility] to be seen. Pt agreed to this.

## 2011-01-17 ENCOUNTER — Encounter: Payer: Self-pay | Admitting: Family Medicine

## 2011-01-17 ENCOUNTER — Ambulatory Visit (INDEPENDENT_AMBULATORY_CARE_PROVIDER_SITE_OTHER): Payer: No Typology Code available for payment source | Admitting: Family Medicine

## 2011-01-17 VITALS — BP 124/78 | HR 84 | Temp 98.3°F | Wt 206.8 lb

## 2011-01-17 DIAGNOSIS — R350 Frequency of micturition: Secondary | ICD-10-CM

## 2011-01-17 DIAGNOSIS — M545 Low back pain, unspecified: Secondary | ICD-10-CM | POA: Insufficient documentation

## 2011-01-17 DIAGNOSIS — Z87440 Personal history of urinary (tract) infections: Secondary | ICD-10-CM

## 2011-01-17 LAB — POCT URINALYSIS DIPSTICK
Bilirubin, UA: NEGATIVE
Blood, UA: NEGATIVE
Glucose, UA: NEGATIVE
Ketones, UA: NEGATIVE
Leukocytes, UA: NEGATIVE
Nitrite, UA: NEGATIVE
Protein, UA: NEGATIVE
Urobilinogen, UA: 0.2
pH, UA: 6

## 2011-01-17 MED ORDER — CELECOXIB 100 MG PO CAPS: 100.0000 mg | ORAL_CAPSULE | Freq: Two times a day (BID) | ORAL | Status: DC | PRN

## 2011-01-17 NOTE — Assessment & Plan Note (Addendum)
With continued suprapubic pressure. I do not think pt currently has continued UTI, so did not prescribe further abx.  Did advise pt to f/u with urology - has appt in 3 days.

## 2011-01-17 NOTE — Patient Instructions (Addendum)
symptoms don't sound consistent with kidney stone or continued infection.  I do think it's a good idea to have you see urologist though. We will send culture on the urine - pass by lab to set up. I wonder if this back pain is coming from your spine or from sacroiliac joint inflammation. Treat with short course of celebrex - one twice daily for 7 days.  Don't take with alleve. Alternate ice/heat to lower back (whichever makes this feel better) Please let us know if not improving as expected. I don't think we need any more antibiotic.  Sacroiliac Joint Dysfunction The sacroiliac joint connects the lower part of the spine (the sacrum) with the bones of the pelvis. CAUSES  Sometimes, there is no obvious reason for sacroiliac joint dysfunction. Other times, it may occur   During pregnancy.   After injury, such as:   Car accidents.   Sport-related injuries.   Work-related injuries.   Due to one leg being shorter than the other.   Due to other conditions that affect the joints, such as:   Rheumatoid arthritis.   Gout.   Psoriasis.   Joint infection (septic arthritis).  SYMPTOMS  Symptoms may include:  Pain in the:   Lower back.   Buttocks.   Groin.   Thighs and legs.   Difficult sitting, standing, walking, lying, bending or lifting.  DIAGNOSIS  A number of tests may be used to help diagnose the cause of sacroiliac joint dysfunction, including:  Imaging tests to look for other causes of pain, including:   MRI.   CT scan.   Bone scan.   Diagnostic injection: During a special x-ray (called fluoroscopy), a needle is put into the sacroiliac joint. A numbing medicine is injected into the joint. If the pain is improved or stopped, the diagnosis of sacroiliac joint dysfunction is more likely.  TREATMENT  There are a number of types of treatment used for sacroiliac joint dysfunction, including:  Only take over-the-counter or prescription medicines for pain, discomfort,  or fever as directed by your caregiver.   Medications to relax muscles.   Rest. Decreasing activity can help cut down on painful muscle spasms and allow the back to heal.   Application of heat or ice to the lower back may improve muscle spasms and soothe pain.   Brace. A special back brace, called a sacroiliac belt, can help support the joint while your back is healing.   Physical therapy can help teach comfortable positions and exercises to strengthen muscles that support the sacroiliac joint.   Cortisone injections. Injections of steroid medicine into the joint can help decrease swelling and improve pain.   Hyaluronic acid injections. This chemical improves lubrication within the sacroiliac joint, thereby decreasing pain.   Radiofrequency ablation. A special needle is placed into the joint, where it burns away nerves that are carrying pain messages from the joint.   Surgery. Because pain occurs during movement of the joint, screws and plates may be installed in order to limit or prevent joint motion.  HOME CARE INSTRUCTIONS   Take all medications exactly as directed.   Follow instructions regarding both rest and physical activity, to avoid worsening the pain.   Do physical therapy exercises exactly as prescribed.  SEEK IMMEDIATE MEDICAL CARE IF:  You experience increasingly severe pain.   You develop new symptoms, such as numbness or tingling in your legs or feet.   You lose bladder or bowel control.  Document Released: 05/15/2008 Document Revised: 10/29/2010 Document  Reviewed: 05/15/2008 [facility] Patient Information 2012 [place], [place].

## 2011-01-17 NOTE — Progress Notes (Signed)
  Subjective:    Patient ID: Jade Cardenas, female    DOB: [birth year], 60 y.o.   MRN: [id number]  HPI CC: ?UTI  60 yo with h/o RA, IBS, h/o gastric hemorrhage in past.  Seen 01/06/2011 with 1d hx frequency/urgency, lower back pain/pressure, treated with macrobid x7 days, felt better ok this but had HA.  Finished but sxs returned.  so called in cipro 500mg  bid x 3d.  Today was last day of cipro, didn't really help sxs.  UA only with some blood.  UCx grew 60k MBM.  Has urology appt 01/20/2011.  States gets UTI yearly.  Endorsing lower back pain, sometimes radiating up midline.  R>L.  Yesterday back pain esp bad.  Also with suprapubic pressure.  Today feeling better but still with some back pain.  Has been taking alleve 220mg  2 in am.  No dysuria, urgency, frequency, hematuria.  No fevers/chills, nausea/vomiting, diarrhea/constipation.  H/o back surgery, bad disks in back.  Hard to tell where back pain is coming from. No h/o kidney stones.  Father did have them. Nonsmoker. Denies injury to back.  No shooting pain down legs, no bowel/bladder incontinence, no leg weakness.  Review of Systems Per HPI    Objective:   Physical Exam  Nursing note and vitals reviewed. Constitutional: She is oriented to person, place, and time. She appears well-developed and well-nourished. No distress.  HENT:  Head: Normocephalic and atraumatic.  Mouth/Throat: Oropharynx is clear and moist. No oropharyngeal exudate.  Eyes: Conjunctivae and EOM are normal. Pupils are equal, round, and reactive to light. No scleral icterus.  Neck: Normal range of motion. Neck supple.  Abdominal: Soft. Bowel sounds are normal. She exhibits no distension and no mass. There is no hepatosplenomegaly. There is tenderness (pressure) in the suprapubic area. There is no rebound, no guarding and no CVA tenderness. No hernia.  Musculoskeletal: She exhibits no edema.       Mild midline tendernss at lumbar spine around L1/2. R SIJ  tenderness. No pain GTB or sciatic notch. Neg SLR bilaterally. No pain with int/ext rotation at hip. No CVA tenderness  Neurological: She is alert and oriented to person, place, and time. She has normal strength. No sensory deficit. Coordination normal.       Assessment & Plan:

## 2011-01-17 NOTE — Assessment & Plan Note (Addendum)
Story not consistent with continued UTI, UA WNL, but sent culture to ensure. Story not consistent with kidney stones. Advised pt to keep appt with urology to f/u blood in urine, LUTS. H/o RA, exam consistent with component of sacroiliitis, will treat as such with ice/heat, stretching of hamstrings and SIJ discussed, short course of celebrex. H/o lumbar ddd, but I don't think this is where sxs are coming from. To notify us if sxs not improving for close f/u with PCP.

## 2011-01-19 ENCOUNTER — Telehealth: Payer: Self-pay | Admitting: *Deleted

## 2011-01-19 NOTE — Telephone Encounter (Signed)
Faxed completed form to [facility]. Will await approval.

## 2011-01-19 NOTE — Telephone Encounter (Signed)
Filled out and routed to kim.

## 2011-01-19 NOTE — Telephone Encounter (Signed)
Received Prior Authorization request from [facility]. Placed in your IN box

## 2011-01-19 NOTE — Telephone Encounter (Signed)
Agree with evaluation  rec she see the urologist about the continued  urinary sx .

## 2011-01-19 NOTE — Telephone Encounter (Signed)
Pt was seen at [facility]. They sent her to [facility] for a urine culture but the md thought it could be Sacroiliac Joint Dysfunction. Pt is wanting to know if she should still go to the urologist. She didn't have any blood in her urine on Sat and it looks like the culture is still not back yet.

## 2011-01-19 NOTE — Telephone Encounter (Signed)
Prior auth given for celebrex, advised pharmacy,  Approval letter placed on doctor's desk for signature and scanning.

## 2011-01-19 NOTE — Telephone Encounter (Signed)
Pt aware of this. 

## 2011-01-20 ENCOUNTER — Ambulatory Visit (INDEPENDENT_AMBULATORY_CARE_PROVIDER_SITE_OTHER): Payer: PRIVATE HEALTH INSURANCE | Admitting: Internal Medicine

## 2011-01-20 DIAGNOSIS — Z23 Encounter for immunization: Secondary | ICD-10-CM

## 2011-01-27 ENCOUNTER — Encounter: Payer: Self-pay | Admitting: Family Medicine

## 2011-05-06 ENCOUNTER — Other Ambulatory Visit: Payer: Self-pay | Admitting: Obstetrics & Gynecology

## 2011-05-06 DIAGNOSIS — Z1231 Encounter for screening mammogram for malignant neoplasm of breast: Secondary | ICD-10-CM

## 2011-06-04 ENCOUNTER — Ambulatory Visit
Admission: RE | Admit: 2011-06-04 | Discharge: 2011-06-04 | Disposition: A | Discharge status: Home / Self Care | Payer: No Typology Code available for payment source | Source: Ambulatory Visit | Attending: Obstetrics & Gynecology | Admitting: Obstetrics & Gynecology

## 2011-06-04 DIAGNOSIS — Z1231 Encounter for screening mammogram for malignant neoplasm of breast: Secondary | ICD-10-CM

## 2011-06-08 ENCOUNTER — Telehealth: Payer: Self-pay | Admitting: Internal Medicine

## 2011-06-08 MED ORDER — LOVASTATIN 20 MG PO TABS: 20.0000 mg | ORAL_TABLET | Freq: Every day | ORAL | Status: DC

## 2011-06-08 MED ORDER — ENALAPRIL MALEATE 10 MG PO TABS: 10.0000 mg | ORAL_TABLET | Freq: Every day | ORAL | Status: DC

## 2011-06-08 NOTE — Telephone Encounter (Signed)
Patient called stating that she need a refill on her enalopril 10 mg and lovastatin 20mg  sent to [facility] order pharmacy. Please assist.

## 2011-06-08 NOTE — Telephone Encounter (Signed)
Rx sent to pharmacy   

## 2011-06-18 ENCOUNTER — Other Ambulatory Visit: Payer: Self-pay | Admitting: Rheumatology

## 2011-06-18 DIAGNOSIS — M545 Low back pain, unspecified: Secondary | ICD-10-CM

## 2011-06-18 DIAGNOSIS — R2230 Localized swelling, mass and lump, unspecified upper limb: Secondary | ICD-10-CM

## 2011-06-19 LAB — CREATININE, SERUM: Creat: 0.77 mg/dL (ref 0.50–1.10)

## 2011-06-19 LAB — BUN: BUN: 21 mg/dL (ref 6–23)

## 2011-06-22 ENCOUNTER — Ambulatory Visit
Admission: RE | Admit: 2011-06-22 | Discharge: 2011-06-22 | Disposition: A | Discharge status: Home / Self Care | Payer: No Typology Code available for payment source | Source: Ambulatory Visit | Attending: Rheumatology | Admitting: Rheumatology

## 2011-06-22 DIAGNOSIS — M545 Low back pain, unspecified: Secondary | ICD-10-CM

## 2011-06-22 DIAGNOSIS — R2230 Localized swelling, mass and lump, unspecified upper limb: Secondary | ICD-10-CM

## 2011-06-22 MED ORDER — GADOBENATE DIMEGLUMINE 529 MG/ML IV SOLN
20.0000 mL | Freq: Once | INTRAVENOUS | Status: AC | PRN
  Administered 2011-06-22: 20 mL via INTRAVENOUS

## 2011-07-23 ENCOUNTER — Other Ambulatory Visit: Payer: Self-pay | Admitting: Neurosurgery

## 2011-07-28 ENCOUNTER — Encounter (HOSPITAL_COMMUNITY): Payer: Self-pay | Admitting: *Deleted

## 2011-07-28 ENCOUNTER — Inpatient Hospital Stay (HOSPITAL_COMMUNITY)
Admission: RE | Admit: 2011-07-28 | Discharge: 2011-07-28 | Payer: No Typology Code available for payment source | Source: Ambulatory Visit

## 2011-07-28 NOTE — Progress Notes (Addendum)
Dr.Nishan is cardiologist-last saw him 60yrs ago and only saw him d/t syncope;doesn't have to see him anymore  Echo and stress test done in 2008 with Dr.Nishan  Denies ever having a heart cath  Medical MD is Dr.Panosch with [facility] at [facility]  Rheumatologist is Dr.william truslow  Gastroenterologist-Dr.Metolff

## 2011-07-28 NOTE — Pre-Procedure Instructions (Signed)
[name]  07/28/2011   Your procedure is scheduled on: Fri, June 7 @ 9:55 AM  Report to [facility] at 7:45 AM.  Call this number if you have problems the morning of surgery: [phone]   Remember:   Do not eat food:After Midnight.  May have clear liquids: up to 4 Hours before arrival.(until 3:45 am)  Clear liquids include soda, tea, black coffee, apple or grape juice, broth,water  Take these medicines the morning of surgery with A SIP OF WATER: Nexium and Claritin   Do not wear jewelry, make-up or nail polish.  Do not wear lotions, powders, or perfumes.  Do not shave 48 hours prior to surgery.   Do not bring valuables to the hospital.  Contacts, dentures or bridgework may not be worn into surgery.  Leave suitcase in the car. After surgery it may be brought to your room.  For patients admitted to the hospital, checkout time is 11:00 AM the day of discharge.   Special Instructions: CHG Shower Use Special Wash: 1/2 bottle night before surgery and 1/2 bottle morning of surgery.   Please read over the following fact sheets that you were given: Pain Booklet, Coughing and Deep Breathing, Blood Transfusion Information, MRSA Information and Surgical Site Infection Prevention

## 2011-07-28 NOTE — Progress Notes (Signed)
Confirmed lab appointment with pt for 07/30/11 @ 1040

## 2011-07-30 ENCOUNTER — Encounter (HOSPITAL_COMMUNITY)
Admission: RE | Admit: 2011-07-30 | Discharge: 2011-07-30 | Disposition: A | Discharge status: Home / Self Care | Payer: No Typology Code available for payment source | Source: Ambulatory Visit | Attending: Neurosurgery | Admitting: Neurosurgery

## 2011-07-30 ENCOUNTER — Ambulatory Visit (HOSPITAL_COMMUNITY)
Admission: RE | Admit: 2011-07-30 | Discharge: 2011-07-30 | Disposition: A | Discharge status: Home / Self Care | Payer: No Typology Code available for payment source | Source: Ambulatory Visit | Attending: Neurosurgery | Admitting: Neurosurgery

## 2011-07-30 DIAGNOSIS — M4800 Spinal stenosis, site unspecified: Secondary | ICD-10-CM | POA: Insufficient documentation

## 2011-07-30 DIAGNOSIS — Z01811 Encounter for preprocedural respiratory examination: Secondary | ICD-10-CM | POA: Insufficient documentation

## 2011-07-30 DIAGNOSIS — Z01818 Encounter for other preprocedural examination: Secondary | ICD-10-CM | POA: Insufficient documentation

## 2011-07-30 DIAGNOSIS — K449 Diaphragmatic hernia without obstruction or gangrene: Secondary | ICD-10-CM | POA: Insufficient documentation

## 2011-07-30 DIAGNOSIS — Z01812 Encounter for preprocedural laboratory examination: Secondary | ICD-10-CM | POA: Insufficient documentation

## 2011-07-30 LAB — DIFFERENTIAL
Basophils Absolute: 0 10*3/uL (ref 0.0–0.1)
Basophils Relative: 1 % (ref 0–1)
Eosinophils Absolute: 0.1 10*3/uL (ref 0.0–0.7)
Eosinophils Relative: 1 % (ref 0–5)
Lymphocytes Relative: 32 % (ref 12–46)
Lymphs Abs: 2.2 10*3/uL (ref 0.7–4.0)
Monocytes Absolute: 0.3 10*3/uL (ref 0.1–1.0)
Monocytes Relative: 4 % (ref 3–12)
Neutro Abs: 4.3 10*3/uL (ref 1.7–7.7)
Neutrophils Relative %: 63 % (ref 43–77)

## 2011-07-30 LAB — CBC
HCT: 31 % — ABNORMAL LOW (ref 36.0–46.0)
Hemoglobin: 9.7 g/dL — ABNORMAL LOW (ref 12.0–15.0)
MCH: 24.9 pg — ABNORMAL LOW (ref 26.0–34.0)
MCHC: 31.3 g/dL (ref 30.0–36.0)
MCV: 79.7 fL (ref 78.0–100.0)
Platelets: 391 10*3/uL (ref 150–400)
RBC: 3.89 MIL/uL (ref 3.87–5.11)
RDW: 15.5 % (ref 11.5–15.5)
WBC: 6.8 10*3/uL (ref 4.0–10.5)

## 2011-07-30 LAB — BASIC METABOLIC PANEL
BUN: 20 mg/dL (ref 6–23)
CO2: 26 mEq/L (ref 19–32)
Calcium: 9.7 mg/dL (ref 8.4–10.5)
Chloride: 105 mEq/L (ref 96–112)
Creatinine, Ser: 0.73 mg/dL (ref 0.50–1.10)
GFR calc Af Amer: >90 mL/min (ref >90)
GFR calc non Af Amer: >90 mL/min (ref >90)
Glucose, Bld: 103 mg/dL — ABNORMAL HIGH (ref 70–99)
Potassium: 4.9 mEq/L (ref 3.5–5.1)
Sodium: 142 mEq/L (ref 135–145)

## 2011-07-30 LAB — SURGICAL PCR SCREEN
MRSA, PCR: NEGATIVE
Staphylococcus aureus: NEGATIVE

## 2011-08-01 HISTORY — PX: BACK SURGERY: SHX140

## 2011-08-04 MED ORDER — CEFAZOLIN SODIUM 1-5 GM-% IV SOLN: 1.0000 g | INTRAVENOUS | Status: DC

## 2011-08-04 MED ORDER — DEXAMETHASONE SODIUM PHOSPHATE 10 MG/ML IJ SOLN
10.0000 mg | INTRAMUSCULAR | Status: AC
  Administered 2011-08-05: 10 mg via INTRAVENOUS

## 2011-08-04 MED ORDER — CEFAZOLIN SODIUM-DEXTROSE 2-3 GM-% IV SOLR
2.0000 g | INTRAVENOUS | Status: AC
  Administered 2011-08-05: 2 g via INTRAVENOUS

## 2011-08-05 ENCOUNTER — Ambulatory Visit (HOSPITAL_COMMUNITY): Payer: No Typology Code available for payment source | Admitting: Anesthesiology

## 2011-08-05 ENCOUNTER — Encounter (HOSPITAL_COMMUNITY)
Admission: RE | Disposition: A | Discharge status: Home / Self Care | Payer: Self-pay | Source: Ambulatory Visit | Attending: Neurosurgery

## 2011-08-05 ENCOUNTER — Encounter (HOSPITAL_COMMUNITY): Payer: Self-pay | Admitting: Neurosurgery

## 2011-08-05 ENCOUNTER — Inpatient Hospital Stay (HOSPITAL_COMMUNITY)
Admission: RE | Admit: 2011-08-05 | Discharge: 2011-08-08 | DRG: 460 | Disposition: A | Discharge status: Home / Self Care | Payer: No Typology Code available for payment source | Source: Ambulatory Visit | Attending: Neurosurgery | Admitting: Neurosurgery

## 2011-08-05 ENCOUNTER — Ambulatory Visit (HOSPITAL_COMMUNITY): Payer: No Typology Code available for payment source

## 2011-08-05 ENCOUNTER — Encounter (HOSPITAL_COMMUNITY): Payer: Self-pay | Admitting: Anesthesiology

## 2011-08-05 ENCOUNTER — Encounter (HOSPITAL_COMMUNITY): Payer: Self-pay | Admitting: *Deleted

## 2011-08-05 DIAGNOSIS — M48062 Spinal stenosis, lumbar region with neurogenic claudication: Principal | ICD-10-CM | POA: Diagnosis present

## 2011-08-05 DIAGNOSIS — K449 Diaphragmatic hernia without obstruction or gangrene: Secondary | ICD-10-CM | POA: Diagnosis present

## 2011-08-05 DIAGNOSIS — G8929 Other chronic pain: Secondary | ICD-10-CM | POA: Diagnosis present

## 2011-08-05 DIAGNOSIS — I10 Essential (primary) hypertension: Secondary | ICD-10-CM | POA: Diagnosis present

## 2011-08-05 DIAGNOSIS — Z860100 Personal history of colon polyps, unspecified: Secondary | ICD-10-CM

## 2011-08-05 DIAGNOSIS — K219 Gastro-esophageal reflux disease without esophagitis: Secondary | ICD-10-CM | POA: Diagnosis present

## 2011-08-05 DIAGNOSIS — M4310 Spondylolisthesis, site unspecified: Secondary | ICD-10-CM | POA: Diagnosis present

## 2011-08-05 DIAGNOSIS — Z8711 Personal history of peptic ulcer disease: Secondary | ICD-10-CM

## 2011-08-05 DIAGNOSIS — I4510 Unspecified right bundle-branch block: Secondary | ICD-10-CM | POA: Diagnosis present

## 2011-08-05 DIAGNOSIS — Z9089 Acquired absence of other organs: Secondary | ICD-10-CM

## 2011-08-05 DIAGNOSIS — Z90710 Acquired absence of both cervix and uterus: Secondary | ICD-10-CM

## 2011-08-05 DIAGNOSIS — J309 Allergic rhinitis, unspecified: Secondary | ICD-10-CM | POA: Diagnosis present

## 2011-08-05 DIAGNOSIS — M47817 Spondylosis without myelopathy or radiculopathy, lumbosacral region: Secondary | ICD-10-CM | POA: Diagnosis present

## 2011-08-05 DIAGNOSIS — G4700 Insomnia, unspecified: Secondary | ICD-10-CM | POA: Diagnosis present

## 2011-08-05 DIAGNOSIS — E785 Hyperlipidemia, unspecified: Secondary | ICD-10-CM | POA: Diagnosis present

## 2011-08-05 DIAGNOSIS — M069 Rheumatoid arthritis, unspecified: Secondary | ICD-10-CM | POA: Diagnosis present

## 2011-08-05 DIAGNOSIS — R110 Nausea: Secondary | ICD-10-CM | POA: Diagnosis not present

## 2011-08-05 DIAGNOSIS — D62 Acute posthemorrhagic anemia: Secondary | ICD-10-CM | POA: Diagnosis not present

## 2011-08-05 DIAGNOSIS — IMO0001 Reserved for inherently not codable concepts without codable children: Secondary | ICD-10-CM | POA: Diagnosis present

## 2011-08-05 HISTORY — DX: Pain in unspecified joint: M25.50

## 2011-08-05 HISTORY — DX: Other chronic pain: G89.29

## 2011-08-05 HISTORY — DX: Gastro-esophageal reflux disease without esophagitis: K21.9

## 2011-08-05 HISTORY — DX: Headache: R51

## 2011-08-05 HISTORY — DX: Fibromyalgia: M79.7

## 2011-08-05 HISTORY — DX: Insomnia, unspecified: G47.00

## 2011-08-05 HISTORY — DX: Personal history of other diseases of the digestive system: Z87.19

## 2011-08-05 HISTORY — DX: Allergy status to unspecified drugs, medicaments and biological substances: Z88.9

## 2011-08-05 HISTORY — DX: Anemia, unspecified: D64.9

## 2011-08-05 HISTORY — DX: Effusion, unspecified joint: M25.40

## 2011-08-05 HISTORY — DX: Other skin changes: R23.8

## 2011-08-05 HISTORY — DX: Dorsalgia, unspecified: M54.9

## 2011-08-05 HISTORY — DX: Spontaneous ecchymoses: R23.3

## 2011-08-05 SURGERY — POSTERIOR LUMBAR FUSION 1 LEVEL
Anesthesia: General | Site: Back | Wound class: Clean

## 2011-08-05 MED ORDER — ACETAMINOPHEN 10 MG/ML IV SOLN
INTRAVENOUS | Status: DC | PRN
  Administered 2011-08-05: 1000 mg via INTRAVENOUS

## 2011-08-05 MED ORDER — BUPIVACAINE HCL (PF) 0.25 % IJ SOLN
INTRAMUSCULAR | Status: DC | PRN
  Administered 2011-08-05: 30 mL

## 2011-08-05 MED ORDER — GLYCOPYRROLATE 0.2 MG/ML IJ SOLN
INTRAMUSCULAR | Status: DC | PRN
  Administered 2011-08-05: .6 mg via INTRAVENOUS

## 2011-08-05 MED ORDER — ONDANSETRON HCL 4 MG/2ML IJ SOLN
INTRAMUSCULAR | Status: DC | PRN
  Administered 2011-08-05: 4 mg via INTRAVENOUS

## 2011-08-05 MED ORDER — OXYCODONE-ACETAMINOPHEN 5-325 MG PO TABS
1.0000 | ORAL_TABLET | ORAL | Status: DC | PRN
  Administered 2011-08-06 – 2011-08-08 (×10): 2 via ORAL
  Administered 2011-08-08 (×2): 1 via ORAL
  Filled 2011-08-05: qty 1
  Filled 2011-08-05 (×5): qty 2
  Filled 2011-08-05: qty 1
  Filled 2011-08-05 (×5): qty 2

## 2011-08-05 MED ORDER — ONDANSETRON HCL 4 MG/2ML IJ SOLN
4.0000 mg | INTRAMUSCULAR | Status: DC | PRN
  Administered 2011-08-05 – 2011-08-06 (×2): 4 mg via INTRAVENOUS
  Filled 2011-08-05: qty 2

## 2011-08-05 MED ORDER — MEPERIDINE HCL 25 MG/ML IJ SOLN: 6.2500 mg | INTRAMUSCULAR | Status: DC | PRN

## 2011-08-05 MED ORDER — CEFAZOLIN SODIUM-DEXTROSE 2-3 GM-% IV SOLR
INTRAVENOUS | Status: AC
  Filled 2011-08-05: qty 50

## 2011-08-05 MED ORDER — HYDROMORPHONE HCL PF 1 MG/ML IJ SOLN
0.5000 mg | INTRAMUSCULAR | Status: DC | PRN
  Administered 2011-08-05: 1 mg via INTRAVENOUS
  Administered 2011-08-06: 0.5 mg via INTRAVENOUS
  Filled 2011-08-05 (×2): qty 1

## 2011-08-05 MED ORDER — FLEET ENEMA 7-19 GM/118ML RE ENEM: 1.0000 | ENEMA | Freq: Once | RECTAL | Status: AC | PRN

## 2011-08-05 MED ORDER — HYDROCODONE-ACETAMINOPHEN 5-325 MG PO TABS
1.0000 | ORAL_TABLET | ORAL | Status: DC | PRN
  Administered 2011-08-06 (×2): 2 via ORAL
  Filled 2011-08-05 (×4): qty 2

## 2011-08-05 MED ORDER — DEXAMETHASONE SODIUM PHOSPHATE 10 MG/ML IJ SOLN
INTRAMUSCULAR | Status: AC
  Filled 2011-08-05: qty 1

## 2011-08-05 MED ORDER — POLYETHYLENE GLYCOL 3350 17 G PO PACK
17.0000 g | PACK | Freq: Every day | ORAL | Status: DC | PRN
  Filled 2011-08-05: qty 1

## 2011-08-05 MED ORDER — DIAZEPAM 5 MG PO TABS
5.0000 mg | ORAL_TABLET | Freq: Four times a day (QID) | ORAL | Status: DC | PRN
  Administered 2011-08-06: 10 mg via ORAL
  Filled 2011-08-05: qty 2

## 2011-08-05 MED ORDER — ENALAPRIL MALEATE 5 MG PO TABS
5.0000 mg | ORAL_TABLET | Freq: Two times a day (BID) | ORAL | Status: DC
  Administered 2011-08-05 – 2011-08-08 (×6): 5 mg via ORAL
  Filled 2011-08-05 (×8): qty 1

## 2011-08-05 MED ORDER — MORPHINE SULFATE 10 MG/ML IJ SOLN
INTRAMUSCULAR | Status: DC | PRN
  Administered 2011-08-05: 5 mg via INTRAVENOUS
  Administered 2011-08-05: 3 mg via INTRAVENOUS
  Administered 2011-08-05: 2 mg via INTRAVENOUS

## 2011-08-05 MED ORDER — ALUM & MAG HYDROXIDE-SIMETH 200-200-20 MG/5ML PO SUSP: 30.0000 mL | Freq: Four times a day (QID) | ORAL | Status: DC | PRN

## 2011-08-05 MED ORDER — HYDROMORPHONE HCL PF 1 MG/ML IJ SOLN
INTRAMUSCULAR | Status: AC
  Filled 2011-08-05: qty 1

## 2011-08-05 MED ORDER — NEOSTIGMINE METHYLSULFATE 1 MG/ML IJ SOLN
INTRAMUSCULAR | Status: DC | PRN
  Administered 2011-08-05: 4 mg via INTRAVENOUS

## 2011-08-05 MED ORDER — HEMOSTATIC AGENTS (NO CHARGE) OPTIME
TOPICAL | Status: DC | PRN
  Administered 2011-08-05: 1 via TOPICAL

## 2011-08-05 MED ORDER — LIDOCAINE HCL (CARDIAC) 20 MG/ML IV SOLN
INTRAVENOUS | Status: DC | PRN
  Administered 2011-08-05: 40 mg via INTRAVENOUS

## 2011-08-05 MED ORDER — ACETAMINOPHEN 10 MG/ML IV SOLN
INTRAVENOUS | Status: AC
  Filled 2011-08-05: qty 100

## 2011-08-05 MED ORDER — PROMETHAZINE HCL 25 MG/ML IJ SOLN: 6.2500 mg | INTRAMUSCULAR | Status: DC | PRN

## 2011-08-05 MED ORDER — HYDROMORPHONE HCL PF 1 MG/ML IJ SOLN
0.2500 mg | INTRAMUSCULAR | Status: DC | PRN
  Administered 2011-08-05 (×2): 0.5 mg via INTRAVENOUS

## 2011-08-05 MED ORDER — LORATADINE 10 MG PO TABS
10.0000 mg | ORAL_TABLET | Freq: Every day | ORAL | Status: DC
  Administered 2011-08-05 – 2011-08-07 (×2): 10 mg via ORAL
  Filled 2011-08-05 (×4): qty 1

## 2011-08-05 MED ORDER — SODIUM CHLORIDE 0.9 % IJ SOLN: 3.0000 mL | INTRAMUSCULAR | Status: DC | PRN

## 2011-08-05 MED ORDER — MENTHOL 3 MG MT LOZG: 1.0000 | LOZENGE | OROMUCOSAL | Status: DC | PRN

## 2011-08-05 MED ORDER — ONDANSETRON HCL 4 MG/2ML IJ SOLN
INTRAMUSCULAR | Status: AC
  Filled 2011-08-05: qty 2

## 2011-08-05 MED ORDER — BISACODYL 10 MG RE SUPP: 10.0000 mg | Freq: Every day | RECTAL | Status: DC | PRN

## 2011-08-05 MED ORDER — ACETAMINOPHEN 650 MG RE SUPP: 650.0000 mg | RECTAL | Status: DC | PRN

## 2011-08-05 MED ORDER — FENTANYL CITRATE 0.05 MG/ML IJ SOLN
INTRAMUSCULAR | Status: DC | PRN
  Administered 2011-08-05: 50 ug via INTRAVENOUS
  Administered 2011-08-05: 200 ug via INTRAVENOUS
  Administered 2011-08-05 (×5): 50 ug via INTRAVENOUS

## 2011-08-05 MED ORDER — SODIUM CHLORIDE 0.9 % IJ SOLN
3.0000 mL | Freq: Two times a day (BID) | INTRAMUSCULAR | Status: DC
Start: 2011-08-05 — End: 2011-08-08
  Administered 2011-08-06 – 2011-08-07 (×4): 3 mL via INTRAVENOUS

## 2011-08-05 MED ORDER — LACTATED RINGERS IV SOLN
INTRAVENOUS | Status: DC | PRN
  Administered 2011-08-05 (×3): via INTRAVENOUS

## 2011-08-05 MED ORDER — 0.9 % SODIUM CHLORIDE (POUR BTL) OPTIME
TOPICAL | Status: DC | PRN
  Administered 2011-08-05: 1000 mL

## 2011-08-05 MED ORDER — PANTOPRAZOLE SODIUM 40 MG PO TBEC
40.0000 mg | DELAYED_RELEASE_TABLET | Freq: Every day | ORAL | Status: DC
  Administered 2011-08-05 – 2011-08-08 (×4): 40 mg via ORAL
  Filled 2011-08-05 (×4): qty 1

## 2011-08-05 MED ORDER — VECURONIUM BROMIDE 10 MG IV SOLR
INTRAVENOUS | Status: DC | PRN
  Administered 2011-08-05 (×2): 3 mg via INTRAVENOUS

## 2011-08-05 MED ORDER — SIMVASTATIN 10 MG PO TABS
10.0000 mg | ORAL_TABLET | Freq: Every day | ORAL | Status: DC
  Administered 2011-08-06 – 2011-08-07 (×2): 10 mg via ORAL
  Filled 2011-08-05 (×3): qty 1

## 2011-08-05 MED ORDER — NAPROXEN SODIUM 220 MG PO TABS: 220.0000 mg | ORAL_TABLET | Freq: Two times a day (BID) | ORAL | Status: DC

## 2011-08-05 MED ORDER — ACETAMINOPHEN 325 MG PO TABS: 650.0000 mg | ORAL_TABLET | ORAL | Status: DC | PRN

## 2011-08-05 MED ORDER — PHENOL 1.4 % MT LIQD: 1.0000 | OROMUCOSAL | Status: DC | PRN

## 2011-08-05 MED ORDER — PROPOFOL 10 MG/ML IV EMUL
INTRAVENOUS | Status: DC | PRN
  Administered 2011-08-05: 100 mg via INTRAVENOUS

## 2011-08-05 MED ORDER — SODIUM CHLORIDE 0.9 % IR SOLN
Status: DC | PRN
  Administered 2011-08-05: 14:00:00

## 2011-08-05 MED ORDER — ROCURONIUM BROMIDE 100 MG/10ML IV SOLN
INTRAVENOUS | Status: DC | PRN
  Administered 2011-08-05: 50 mg via INTRAVENOUS
  Administered 2011-08-05: 30 mg via INTRAVENOUS
  Administered 2011-08-05: 20 mg via INTRAVENOUS

## 2011-08-05 MED ORDER — NAPROXEN 250 MG PO TABS
250.0000 mg | ORAL_TABLET | Freq: Two times a day (BID) | ORAL | Status: DC
  Filled 2011-08-05 (×8): qty 1

## 2011-08-05 MED ORDER — MIDAZOLAM HCL 5 MG/5ML IJ SOLN
INTRAMUSCULAR | Status: DC | PRN
  Administered 2011-08-05 (×2): 2 mg via INTRAVENOUS

## 2011-08-05 MED ORDER — CEFAZOLIN SODIUM 1-5 GM-% IV SOLN
1.0000 g | Freq: Three times a day (TID) | INTRAVENOUS | Status: AC
  Administered 2011-08-05 – 2011-08-06 (×2): 1 g via INTRAVENOUS
  Filled 2011-08-05 (×2): qty 50

## 2011-08-05 MED ORDER — ESTRADIOL 0.05 MG/24HR TD PTWK: 0.0500 mg | MEDICATED_PATCH | TRANSDERMAL | Status: DC

## 2011-08-05 MED ORDER — THROMBIN 20000 UNITS EX SOLR
CUTANEOUS | Status: DC | PRN
  Administered 2011-08-05: 20000 [IU] via TOPICAL

## 2011-08-05 MED ORDER — PHENYLEPHRINE HCL 10 MG/ML IJ SOLN
INTRAMUSCULAR | Status: DC | PRN
  Administered 2011-08-05 (×2): 40 ug via INTRAVENOUS

## 2011-08-05 MED ORDER — SODIUM CHLORIDE 0.9 % IV SOLN
INTRAVENOUS | Status: AC
  Filled 2011-08-05: qty 500

## 2011-08-05 MED ORDER — BACITRACIN 50000 UNITS IM SOLR
INTRAMUSCULAR | Status: AC
  Filled 2011-08-05: qty 1

## 2011-08-05 MED ORDER — SENNA 8.6 MG PO TABS
1.0000 | ORAL_TABLET | Freq: Two times a day (BID) | ORAL | Status: DC
  Administered 2011-08-05 – 2011-08-08 (×6): 8.6 mg via ORAL
  Filled 2011-08-05 (×7): qty 1

## 2011-08-05 MED ORDER — MIDAZOLAM HCL 2 MG/2ML IJ SOLN: 0.5000 mg | Freq: Once | INTRAMUSCULAR | Status: DC | PRN

## 2011-08-05 MED ORDER — SODIUM CHLORIDE 0.9 % IV SOLN
250.0000 mL | INTRAVENOUS | Status: DC
Start: 2011-08-05 — End: 2011-08-08
  Administered 2011-08-06: 250 mL via INTRAVENOUS

## 2011-08-05 SURGICAL SUPPLY — 66 items
BAG DECANTER FOR FLEXI CONT (MISCELLANEOUS) ×2 IMPLANT
BENZOIN TINCTURE PRP APPL 2/3 (GAUZE/BANDAGES/DRESSINGS) ×2 IMPLANT
BLADE SURG ROTATE 9660 (MISCELLANEOUS) IMPLANT
BRUSH SCRUB EZ PLAIN DRY (MISCELLANEOUS) ×2 IMPLANT
BUR MATCHSTICK NEURO 3.0 LAGG (BURR) ×2 IMPLANT
CANISTER SUCTION 2500CC (MISCELLANEOUS) ×2 IMPLANT
CAP LCK SPNE (Orthopedic Implant) ×4 IMPLANT
CAP LOCK SPINE RADIUS (Orthopedic Implant) ×4 IMPLANT
CAP LOCKING (Orthopedic Implant) ×4 IMPLANT
CLOTH BEACON ORANGE TIMEOUT ST (SAFETY) ×2 IMPLANT
CONT SPEC 4OZ CLIKSEAL STRL BL (MISCELLANEOUS) ×4 IMPLANT
COVER BACK TABLE 24X17X13 BIG (DRAPES) IMPLANT
COVER TABLE BACK 60X90 (DRAPES) ×2 IMPLANT
DECANTER SPIKE VIAL GLASS SM (MISCELLANEOUS) IMPLANT
DERMABOND ADVANCED (GAUZE/BANDAGES/DRESSINGS) ×1
DERMABOND ADVANCED .7 DNX12 (GAUZE/BANDAGES/DRESSINGS) ×1 IMPLANT
DRAPE C-ARM 42X72 X-RAY (DRAPES) ×4 IMPLANT
DRAPE LAPAROTOMY 100X72X124 (DRAPES) ×2 IMPLANT
DRAPE POUCH INSTRU U-SHP 10X18 (DRAPES) ×2 IMPLANT
DRAPE PROXIMA HALF (DRAPES) IMPLANT
DRAPE SURG 17X23 STRL (DRAPES) ×8 IMPLANT
ELECT REM PT RETURN 9FT ADLT (ELECTROSURGICAL) ×2
ELECTRODE REM PT RTRN 9FT ADLT (ELECTROSURGICAL) ×1 IMPLANT
EVACUATOR 1/8 PVC DRAIN (DRAIN) ×2 IMPLANT
GAUZE SPONGE 4X4 16PLY XRAY LF (GAUZE/BANDAGES/DRESSINGS) IMPLANT
GLOVE BIO SURGEON STRL SZ8 (GLOVE) ×2 IMPLANT
GLOVE BIOGEL PI IND STRL 7.0 (GLOVE) ×1 IMPLANT
GLOVE BIOGEL PI IND STRL 7.5 (GLOVE) ×1 IMPLANT
GLOVE BIOGEL PI INDICATOR 7.0 (GLOVE) ×1
GLOVE BIOGEL PI INDICATOR 7.5 (GLOVE) ×1
GLOVE ECLIPSE 7.5 STRL STRAW (GLOVE) ×6 IMPLANT
GLOVE ECLIPSE 8.5 STRL (GLOVE) ×4 IMPLANT
GLOVE EXAM NITRILE LRG STRL (GLOVE) IMPLANT
GLOVE EXAM NITRILE MD LF STRL (GLOVE) ×4 IMPLANT
GLOVE EXAM NITRILE XL STR (GLOVE) IMPLANT
GLOVE EXAM NITRILE XS STR PU (GLOVE) IMPLANT
GLOVE INDICATOR 8.5 STRL (GLOVE) ×2 IMPLANT
GLOVE SS BIOGEL STRL SZ 6.5 (GLOVE) ×2 IMPLANT
GLOVE SUPERSENSE BIOGEL SZ 6.5 (GLOVE) ×2
GOWN BRE IMP SLV AUR LG STRL (GOWN DISPOSABLE) ×2 IMPLANT
GOWN BRE IMP SLV AUR XL STRL (GOWN DISPOSABLE) ×8 IMPLANT
GOWN STRL REIN 2XL LVL4 (GOWN DISPOSABLE) IMPLANT
KIT BASIN OR (CUSTOM PROCEDURE TRAY) ×2 IMPLANT
KIT ROOM TURNOVER OR (KITS) ×2 IMPLANT
MILL MEDIUM DISP (BLADE) ×2 IMPLANT
NEEDLE HYPO 22GX1.5 SAFETY (NEEDLE) ×2 IMPLANT
NS IRRIG 1000ML POUR BTL (IV SOLUTION) ×2 IMPLANT
PACK LAMINECTOMY NEURO (CUSTOM PROCEDURE TRAY) ×2 IMPLANT
ROD RADIUS 35MM (Rod) ×4 IMPLANT
SCREW 5.75X40M (Screw) ×4 IMPLANT
SCREW 5.75X45MM (Screw) ×4 IMPLANT
SPONGE GAUZE 4X4 12PLY (GAUZE/BANDAGES/DRESSINGS) ×2 IMPLANT
SPONGE SURGIFOAM ABS GEL 100 (HEMOSTASIS) ×2 IMPLANT
STRIP CLOSURE SKIN 1/2X4 (GAUZE/BANDAGES/DRESSINGS) ×2 IMPLANT
SUT VIC AB 0 CT1 18XCR BRD8 (SUTURE) ×1 IMPLANT
SUT VIC AB 0 CT1 8-18 (SUTURE) ×1
SUT VIC AB 2-0 CT1 18 (SUTURE) ×2 IMPLANT
SUT VIC AB 3-0 SH 8-18 (SUTURE) ×2 IMPLANT
SYR 20ML ECCENTRIC (SYRINGE) ×2 IMPLANT
TAPE CLOTH SURG 4X10 WHT LF (GAUZE/BANDAGES/DRESSINGS) ×2 IMPLANT
TELAMON 22X10 (Cage) ×2 IMPLANT
TOWEL OR 17X24 6PK STRL BLUE (TOWEL DISPOSABLE) ×2 IMPLANT
TOWEL OR 17X26 10 PK STRL BLUE (TOWEL DISPOSABLE) ×2 IMPLANT
TRAY FOLEY CATH 14FRSI W/METER (CATHETERS) ×2 IMPLANT
WATER STERILE IRR 1000ML POUR (IV SOLUTION) ×2 IMPLANT
WEDGE TANGENT 10X26MM ×2 IMPLANT

## 2011-08-05 NOTE — Brief Op Note (Signed)
08/05/2011  4:35 PM  PATIENT:  Jade Cardenas  61 y.o. female  PRE-OPERATIVE DIAGNOSIS:  stenosis/listhesis  POST-OPERATIVE DIAGNOSIS:  stenosis/listhesis  PROCEDURE:  Procedure(s) (LRB): POSTERIOR LUMBAR FUSION 1 LEVEL (N/A)  SURGEON:  Surgeon(s) and Role:    * Temple Pacini, MD - Primary    * Mariam Dollar, MD - Assisting  PHYSICIAN ASSISTANT:   ASSISTANTS:    ANESTHESIA:   general  EBL:  Total I/O In: 2000 [I.V.:2000] Out: 500 [Urine:100; Blood:400]  BLOOD ADMINISTERED:none  DRAINS: (Med) Hemovact drain(s) in the epidural space with  Suction Open   LOCAL MEDICATIONS USED:  MARCAINE     SPECIMEN:  No Specimen  DISPOSITION OF SPECIMEN:  N/A  COUNTS:  YES  TOURNIQUET:  * No tourniquets in log *  DICTATION: .Dragon Dictation  PLAN OF CARE: Admit to inpatient   PATIENT DISPOSITION:  PACU - hemodynamically stable.   Delay start of Pharmacological VTE agent (>24hrs) due to surgical blood loss or risk of bleeding: yes

## 2011-08-05 NOTE — Anesthesia Postprocedure Evaluation (Signed)
  Anesthesia Post-op Note  Patient: Jade Cardenas  Procedure(s) Performed: Procedure(s) (LRB): POSTERIOR LUMBAR FUSION 1 LEVEL (N/A)  Patient Location: PACU  Anesthesia Type: General  Level of Consciousness: awake, alert  and oriented  Airway and Oxygen Therapy: Patient Spontanous Breathing and Patient connected to nasal cannula oxygen  Post-op Pain: none  Post-op Assessment: Post-op Vital signs reviewed, Patient's Cardiovascular Status Stable, Respiratory Function Stable, Patent Airway, No signs of Nausea or vomiting and Pain level controlled  Post-op Vital Signs: Reviewed and stable  Complications: No apparent anesthesia complications

## 2011-08-05 NOTE — Anesthesia Preprocedure Evaluation (Signed)
Anesthesia Evaluation  Patient identified by MRN, date of birth, ID band Patient awake    Reviewed: Allergy & Precautions, H&P , NPO status , Patient's Chart, lab work & pertinent test results  History of Anesthesia Complications Negative for: history of anesthetic complications  Airway Mallampati: II TM Distance: >3 FB Neck ROM: Full    Dental No notable dental hx. (+) Teeth Intact and Dental Advisory Given   Pulmonary neg pulmonary ROS,  breath sounds clear to auscultation  Pulmonary exam normal       Cardiovascular hypertension, - dysrhythmias Rhythm:Regular Rate:Normal  Echo '09:  Normal LVF, normal valves '09 stress myoview: normal as per patient   Neuro/Psych Chronic back pain, some radiation to L leg   Syncopal episode 1/13: cardiac eval OK, related to dehydration and BP meds    GI/Hepatic Neg liver ROS, GERD-  Medicated and Controlled,  Endo/Other  negative endocrine ROS  Renal/GU negative Renal ROS     Musculoskeletal  (+) Arthritis -, Osteoarthritis,  Fibromyalgia -  Abdominal (+) + obese,   Peds  Hematology   Anesthesia Other Findings   Reproductive/Obstetrics                           Anesthesia Physical Anesthesia Plan  ASA: II  Anesthesia Plan: General   Post-op Pain Management:    Induction: Intravenous  Airway Management Planned: Oral ETT  Additional Equipment:   Intra-op Plan:   Post-operative Plan: Extubation in OR  Informed Consent: I have reviewed the patients History and Physical, chart, labs and discussed the procedure including the risks, benefits and alternatives for the proposed anesthesia with the patient or authorized representative who has indicated his/her understanding and acceptance.   Dental advisory given  Plan Discussed with: Surgeon and CRNA  Anesthesia Plan Comments: (Plan routine monitors, GETA)        Anesthesia Quick  Evaluation

## 2011-08-05 NOTE — Anesthesia Procedure Notes (Signed)
Procedure Name: Intubation Date/Time: 08/05/2011 2:11 PM Performed by: Luster Landsberg Pre-anesthesia Checklist: Patient identified, Emergency Drugs available, Suction available and Patient being monitored Patient Re-evaluated:Patient Re-evaluated prior to inductionOxygen Delivery Method: Circle system utilized Preoxygenation: Pre-oxygenation with 100% oxygen Intubation Type: IV induction Ventilation: Mask ventilation without difficulty Laryngoscope Size: Mac and 3 Grade View: Grade I Tube type: Oral Number of attempts: 1 Airway Equipment and Method: Stylet Placement Confirmation: ETT inserted through vocal cords under direct vision,  positive ETCO2 and breath sounds checked- equal and bilateral Secured at: 22 cm Tube secured with: Tape Dental Injury: Teeth and Oropharynx as per pre-operative assessment

## 2011-08-05 NOTE — H&P (Signed)
Jade Cardenas is an 61 y.o. female.   Chief Complaint: Back and bilateral lower extremity pain HPI: 61 year old female status post previous right-sided L4-5 decompressive surgery presents with worsening back and bilateral lower extremity. Workup demonstrates evidence of progressive L4-5 degenerative spondylolisthesis with marked stenosis. Patient's failed conservative management now presents for decompression infusion.  Past Medical History  Diagnosis Date  . HYPERLIPIDEMIA 12/29/2006    takes Lovastatin nightly  . Right bundle branch block 02/09/2007  . IBS 04/28/2007  . MENOPAUSAL DISORDER 12/07/2007  . Rheumatoid arthritis 12/29/2006  . SYNCOPE 12/07/2007  . WEIGHT GAIN 12/18/2008  . Palpitations 02/09/2007  . UNS ADVRS EFF OTH RX MEDICINAL\T\BIOLOGICAL SBSTNC 02/09/2007  . GASTRIC ULCER, ACUTE, HEMORRHAGE, HX OF 02/09/2007  . COLONIC POLYPS, HX OF 02/09/2007  . REDUCTION MAMMOPLASTY, HX OF 02/09/2007  . HYPERTENSION 12/29/2006    takes Enalapril daily  . Headache     occasionally  . CHOLECYSTECTOMY, HX OF   . Fibromyalgia     doesn't require meds  . Joint pain   . Joint swelling   . Chronic back pain   . Bruises easily   . H/O hiatal hernia   . GERD (gastroesophageal reflux disease)     takes Nexium daily  . UTI (lower urinary tract infection)     hx of;saw urologist last yr and he told her she was over reacting and not a big deal   . Anemia   . Hx of seasonal allergies     takes Claritin daily  . Insomnia     related to pain;takes Flexeril and Tylenol PM nightly    Past Surgical History  Procedure Date  . Breast reduction surgery     last 80's  . Cervical fusion     2 12   . Tonsillectomy     as a child  . Abdominal hysterectomy 1986  . Cholecystectomy 1996  . Back surgery 12+yrs ago    Synovial Cyst removal  . Bone spur 6-57yrs ago    right ankle  . Colonoscopy     Family History  Problem Relation Age of Onset  . Diabetes Mother   . Colon cancer  Mother   . Arthritis Mother   . Hypertension Mother   . Stroke Father   . Heart disease Father   . Arthritis Brother   . Hypertension Brother   . Hyperlipidemia Brother   . Anesthesia problems Neg Hx   . Hypotension Neg Hx   . Malignant hyperthermia Neg Hx   . Pseudochol deficiency Neg Hx    Social History:  reports that she has never smoked. She does not have any smokeless tobacco history on file. She reports that she drinks alcohol. She reports that she does not use illicit drugs.  Allergies:  Allergies  Allergen Reactions  . Erythromycin     All Mycins  . Iodine   . Other     CT Contrast    Medications Prior to Admission  Medication Sig Dispense Refill  . acetaminophen (TYLENOL) 650 MG CR tablet Take 650 mg by mouth every 8 (eight) hours as needed.      . cyclobenzaprine (FLEXERIL) 5 MG tablet Take 2.5 mg by mouth at bedtime as needed.        . Diphenhydramine-APAP, sleep, (TYLENOL PM EXTRA STRENGTH PO) Take 1 tablet by mouth at bedtime as needed. For sleep      . enalapril (VASOTEC) 10 MG tablet Take 5 mg by mouth 2 (two) times  daily.      . esomeprazole (NEXIUM) 40 MG capsule Take 40 mg by mouth every morning before breakfast.        . estradiol (VIVELLE-DOT) 0.0375 MG/24HR Place 1 patch onto the skin twice a week.        . fluocinonide (LIDEX) 0.05 % ointment Apply topically 2 (two) times daily.        Marland Kitchen loratadine (CLARITIN) 10 MG tablet Take 10 mg by mouth daily.        Marland Kitchen lovastatin (MEVACOR) 20 MG tablet Take 1 tablet (20 mg total) by mouth at bedtime.  90 tablet  0  . Multiple Vitamin (MULTIVITAMIN) tablet Take 1 tablet by mouth daily.        . naproxen sodium (ANAPROX) 220 MG tablet Take 220 mg by mouth 2 (two) times daily with a meal.        . celecoxib (CELEBREX) 100 MG capsule Take 1 capsule (100 mg total) by mouth 2 (two) times daily as needed for pain.  30 capsule  0    No results found for this or any previous visit (from the past 48 hour(s)). No results  found.  Review of Systems  Constitutional: Negative.   HENT: Negative.   Eyes: Negative.   Respiratory: Negative.   Cardiovascular: Negative.   Gastrointestinal: Negative.   Genitourinary: Negative.   Musculoskeletal: Negative.   Skin: Negative.   Neurological: Negative.   Endo/Heme/Allergies: Negative.   Psychiatric/Behavioral: Negative.     Blood pressure 137/81, pulse 90, temperature 98.9 F (37.2 C), temperature source Oral, resp. rate 18, SpO2 100.00%. Physical Exam  Constitutional: She is oriented to person, place, and time. She appears well-developed and well-nourished.  HENT:  Head: Normocephalic and atraumatic.  Right Ear: External ear normal.  Left Ear: External ear normal.  Nose: Nose normal.  Mouth/Throat: Oropharynx is clear and moist.  Eyes: Conjunctivae and EOM are normal. Pupils are equal, round, and reactive to light.  Neck: Normal range of motion. Neck supple. No tracheal deviation present. No thyromegaly present.  Cardiovascular: Normal rate, regular rhythm, normal heart sounds and intact distal pulses.  Exam reveals no friction rub.   No murmur heard. Respiratory: Effort normal and breath sounds normal. No respiratory distress. She has no wheezes.  GI: Soft. Bowel sounds are normal. She exhibits no distension. There is no tenderness.  Musculoskeletal: Normal range of motion. She exhibits no edema and no tenderness.  Neurological: She is alert and oriented to person, place, and time. She has normal reflexes. No cranial nerve deficit. Coordination normal.  Skin: Skin is warm and dry. No rash noted. No erythema. No pallor.  Psychiatric: She has a normal mood and affect. Her behavior is normal. Judgment and thought content normal.     Assessment/Plan L4-5 degenerative spondylolisthesis with stenosis. Plan bilateral L4-5 redo decompressive laminectomy with bilateral L4 and L5 decompressive foraminotomies for relief of her stenosis. L4-5 posterior lumbar body  fusion using tangent interbody allograft wedge Telamon interbody peek cage and local autographing. L4-5 posterior lateral arthrodesis utilizing nonsegmental pedicle instrumentation. Risks and benefits been explained. Patient wished to proceed. Patient with moderate preoperative anemia. She recognizes the probable need for intraoperative transfusion.  Deyna Carbon A 08/05/2011, 2:03 PM

## 2011-08-05 NOTE — Transfer of Care (Signed)
Immediate Anesthesia Transfer of Care Note  Patient: Jade Cardenas  Procedure(s) Performed: Procedure(s) (LRB): POSTERIOR LUMBAR FUSION 1 LEVEL (N/A)  Patient Location: PACU  Anesthesia Type: General  Level of Consciousness: awake, alert  and patient cooperative  Airway & Oxygen Therapy: Patient Spontanous Breathing and Patient connected to face mask oxygen  Post-op Assessment: Report given to PACU RN and Patient moving all extremities X 4  Post vital signs: Reviewed and stable  Complications: No apparent anesthesia complications

## 2011-08-05 NOTE — Preoperative (Signed)
Beta Blockers   Reason not to administer Beta Blockers:Not Applicable 

## 2011-08-05 NOTE — Op Note (Signed)
Date of procedure: 08/05/2011  Date of dictation: Same  Service: Neurosurgery  Preoperative diagnosis: L4-5 degenerative/post laminectomy grade 1 spondylolisthesis with stenosis and neurogenic claudication.  Postoperative diagnosis: Same  Procedure Name: Bilateral L4-5 redo decompressive laminectomy and bilateral L4 and L5 decompressive foraminotomies, more than would be required for simple interbody fusion alone.  L4-5 posterior lumbar interbody fusion with tangent interbody allograft wedge Telamon interbody peek cage and local autograft.  L4-5 posterior lateral arthrodesis with nonsegmental pedicle screw instrumentation and local autograft  Surgeon:Omayra Tulloch A.Dorion Petillo, M.D.  Asst. Surgeon: Wynetta Emery  Anesthesia: General  Indication: 61 year old female status post previous decompressive laminotomy and foraminotomies for resection of synovial cyst at L4-5 with worsening back and bilateral lower extremity symptoms. Workup demonstrates evidence of an unstable grade 1 spondylolisthesis with marked L4-L5 nerve root compression.  Operative note: After induction of anesthesia, patient positioned prone on the Wilson frame and appropriate padded. Incision was made exposure obtained retractor placed and fluoroscopy used and levels confirmed. Decompressive laminectomy using Leksell rongeurs Kerrison rongeurs and a high-speed drill then performed to remove the entire lamina of L4, inferior facets of L4, superior facets of L5, and superior aspect of lamina of L5. All bone cleaned in use and later autografting. Ligamentum flavum and L5 epidural scar elevated and resected. Wide decompressive foraminotomies performed along the course the exiting L4 and L5 nerve roots bilaterally. Bilateral discectomies performed. Disc space distracted to 10 mm. Thecal sac and nerve roots protected on the right side and the spaces and reamed and cut with 10 mm tangent instruments. Soft tissue was removed from the interspace. 10 x 20 mm  Telamon cage packed with morselized autograft and packed into place and recessed proximally 1 mm from posterior cortical margin of L4. Distractor removed and patient's left side. The space was again reamed and cut with 10 mm tangent instruments. Soft tissue removed and interspace. The spaces further curettage. Morselize autograft packed for later fusion. 10 x 26 over tangent wedge impacted in place and recessed roughly 1 mm from the posterior cortical margin of L4. Pedicles at L4 and L5 and identified using surface landmarks and intraoperative fluoroscopy. Superficial bone overlying the pedicle removed using high-speed drill. Each pedicle then probed using a pedicle awl each pedicle awl track tapped all of tap holes were probed and found to be solidly within bone.  5.75 x 45 mm radius screws placed bilaterally at L4. 5.75 x 40 mm screws placed bilaterally at L5. Transverse processes of L4 and L5 decorticated using high-speed drill. Morselized autograft packed posterior lateral greater fusion. Short segment titanium rod placed over the screw heads. Prescription locking caps placed over the screw heads. Locking caps engaged with the construct under compression. Final images revealed position the bone graft and hardware at the proper operative level with normal lamina spine. Wound irrigated one final time. Gelfoam was placed topically for hemostasis. Medium Hemovac drain left in the epidural space. Wound closed in layers. Steri-Strips triggers were applied. No apparent complications. Patient tolerated procedure well and returned to recovery postop.

## 2011-08-06 LAB — CBC
HCT: 22.8 % — ABNORMAL LOW (ref 36.0–46.0)
Hemoglobin: 7.4 g/dL — ABNORMAL LOW (ref 12.0–15.0)
MCH: 25.4 pg — ABNORMAL LOW (ref 26.0–34.0)
MCHC: 32.5 g/dL (ref 30.0–36.0)
MCV: 78.4 fL (ref 78.0–100.0)
Platelets: 348 10*3/uL (ref 150–400)
RBC: 2.91 MIL/uL — ABNORMAL LOW (ref 3.87–5.11)
RDW: 15.6 % — ABNORMAL HIGH (ref 11.5–15.5)
WBC: 9.9 10*3/uL (ref 4.0–10.5)

## 2011-08-06 LAB — BASIC METABOLIC PANEL
BUN: 13 mg/dL (ref 6–23)
CO2: 25 mEq/L (ref 19–32)
Calcium: 8.9 mg/dL (ref 8.4–10.5)
Chloride: 105 mEq/L (ref 96–112)
Creatinine, Ser: 0.67 mg/dL (ref 0.50–1.10)
GFR calc Af Amer: >90 mL/min (ref >90)
GFR calc non Af Amer: >90 mL/min (ref >90)
Glucose, Bld: 134 mg/dL — ABNORMAL HIGH (ref 70–99)
Potassium: 4.3 mEq/L (ref 3.5–5.1)
Sodium: 139 mEq/L (ref 135–145)

## 2011-08-06 MED ORDER — DIPHENHYDRAMINE HCL 50 MG/ML IJ SOLN
12.5000 mg | Freq: Four times a day (QID) | INTRAMUSCULAR | Status: DC | PRN
  Administered 2011-08-06 (×2): 12.5 mg via INTRAVENOUS
  Filled 2011-08-06 (×2): qty 1

## 2011-08-06 MED ORDER — DIPHENHYDRAMINE HCL 25 MG PO CAPS
25.0000 mg | ORAL_CAPSULE | Freq: Four times a day (QID) | ORAL | Status: DC | PRN
  Administered 2011-08-06 – 2011-08-08 (×4): 25 mg via ORAL
  Filled 2011-08-06 (×4): qty 1

## 2011-08-06 NOTE — Progress Notes (Signed)
Patient requested that her foley cath be d/c after PT get her OOB. Pt still complains of some nausea and back pains, PRN meds given as ordered.

## 2011-08-06 NOTE — Progress Notes (Signed)
Postop day 1. Complains of some back pain and nausea. No lower extremity pain. Overall feels improved from preop.  She is afebrile. Heart rate and blood pressure normal. Dressing clean and dry. Drain output low. Urine output reasonably high. Awake and alert. No evidence of orthostasis. Motor and sensory exam intact. Chest abdomen benign.  Hematocrit down to 22.8.  Progressing well. Mobilize with physical and occupational therapy. Recheck CBC in morning. Currently significantly anemic secondary to chronic anemia present at admission coupled with acute blood loss anemia. No evidence of symptoms from this anemia however. We'll follow.

## 2011-08-06 NOTE — Progress Notes (Signed)
Occupational Therapy Evaluation Patient Details Name: Jade Cardenas MRN: [id number] DOB: 02/22/1951 Today's Date: 08/06/2011 Time: 4098-1191 OT Time Calculation (min): 30 min  OT Assessment / Plan / Recommendation Clinical Impression  Pt s/p L4-5 posterior lumbar fusion thus affecting PLOF. Will benefit from acute OT to address below problem list in prep for d/c home with assist from friends.    OT Assessment  Patient needs continued OT Services    Follow Up Recommendations  Supervision - Intermittent    Barriers to Discharge      Equipment Recommendations  None recommended by OT    Recommendations for Other Services    Frequency  Min 2X/week    Precautions / Restrictions Precautions Precautions: Back Precaution Booklet Issued: Yes (comment) Precaution Comments: pt educated on 3/3 back precautions Required Braces or Orthoses: Spinal Brace Spinal Brace: Lumbar corset;Applied in sitting position Restrictions Weight Bearing Restrictions: No   Pertinent Vitals/Pain See vitals    ADL  Upper Body Dressing: Performed;Set up Where Assessed - Upper Body Dressing: Unsupported sitting Lower Body Dressing: Performed;Minimal assistance Where Assessed - Lower Body Dressing: Sopported sit to stand Toilet Transfer: Performed;Minimal assistance Toilet Transfer Method:  (ambulating) Toilet Transfer Equipment: Raised toilet seat with arms (or 3-in-1 over toilet) Toileting - Clothing Manipulation and Hygiene: Performed;Minimal assistance Where Assessed - Toileting Clothing Manipulation and Hygiene: Sit to stand from 3-in-1 or toilet Equipment Used: Back brace;Gait belt (hand held assist) Transfers/Ambulation Related to ADLs: Min assist with 1 x hand held ADL Comments: Pt required mod assist to don back brace sitting EOB. Required min assist for steadying during standing components of ADLs. Able to don pants sit <> stand from bed and by threading legs through pants by raising leg.      OT Diagnosis: Acute pain  OT Problem List: Decreased activity tolerance;Decreased knowledge of use of DME or AE;Decreased knowledge of precautions;Pain OT Treatment Interventions: Self-care/ADL training;DME and/or AE instruction;Therapeutic activities;Patient/family education   OT Goals Acute Rehab OT Goals OT Goal Formulation: With patient Time For Goal Achievement: 08/13/11 Potential to Achieve Goals: Good ADL Goals Pt Will Perform Grooming: with modified independence;Standing at sink ADL Goal: Grooming - Progress: Goal set today Pt Will Perform Lower Body Bathing: with modified independence;Sit to stand from chair;Sit to stand from bed ADL Goal: Lower Body Bathing - Progress: Goal set today Pt Will Perform Lower Body Dressing: with modified independence;Sit to stand from chair;Sit to stand from bed ADL Goal: Lower Body Dressing - Progress: Goal set today Pt Will Perform Tub/Shower Transfer: Shower transfer;Ambulation;with DME;with supervision;Shower seat with back;Maintaining back safety precautions ADL Goal: Tub/Shower Transfer - Progress: Goal set today Additional ADL Goal #1: Pt will perform all components of toileting ADL with mod I while maintaining back safety precautions. ADL Goal: Additional Goal #1 - Progress: Goal set today Miscellaneous OT Goals Miscellaneous OT Goal #1: Pt will don/doff back brace with mod I sitting EOB as precursor for functional mobility. OT Goal: Miscellaneous Goal #1 - Progress: Goal set today Miscellaneous OT Goal #2: Pt will independently verbalize and demonstrate 3/3 back precautions during all ADL activity. OT Goal: Miscellaneous Goal #2 - Progress: Goal set today  Visit Information  Last OT Received On: 08/06/11 Assistance Needed: +1 PT/OT Co-Evaluation/Treatment: Yes    Subjective Data      Prior Functioning  Home Living Lives With: Alone Available Help at Discharge: Available 24 hours/day;Friend(s) Type of Home:  (townhouse) Home  Access: Stairs to enter [employer] of Steps: 1 Entrance Stairs-Rails:  None Home Layout: One level Bathroom Shower/Tub: Glass blower/designer: Handicapped height Bathroom Accessibility: Yes How Accessible: Accessible via walker Home Adaptive Equipment: Shower chair without back;Hand-held shower hose;Straight cane;Walker - rolling Prior Function Level of Independence: Independent Able to Take Stairs?: Yes Driving: Yes Vocation: Full time employment Comments: Producer, 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 difficulties Dominant Hand: Right    Cognition  Overall Cognitive Status: Appears within functional limits for tasks assessed/performed Arousal/Alertness: Awake/alert Orientation Level: Appears intact for tasks assessed Behavior During Session: [facility] for tasks performed    Extremity/Trunk Assessment Right Upper Extremity Assessment RUE ROM/Strength/Tone: Within functional levels Left Upper Extremity Assessment LUE ROM/Strength/Tone: Within functional levels Right Lower Extremity Assessment RLE ROM/Strength/Tone: Within functional levels RLE Sensation: WFL - Light Touch Left Lower Extremity Assessment LLE ROM/Strength/Tone: Within functional levels LLE Sensation: WFL - Light Touch   Mobility Bed Mobility Bed Mobility: Rolling Left;Left Sidelying to Sit;Sitting - Scoot to Edge of Bed Rolling Left: 4: Min guard Left Sidelying to Sit: 4: Min assist;With rails;HOB elevated (slightly elevated) Sitting - Scoot to Edge of Bed: 4: Min guard;With rail Details for Bed Mobility Assistance: VC for proper sequencing to maintain back precautions during transfer. Min assist for trunk control into sitting. Transfers Sit to Stand: 4: Min assist;With upper extremity assist;From bed;From chair/3-in-1 Stand to Sit: 4: Min assist;With upper extremity assist;To chair/3-in-1 Details for Transfer Assistance: VC for hand placement and sequencing to  maintain back precautions during standing. Min assist for stability and controlled descent.   Exercise    Balance    End of Session OT - End of Session Equipment Utilized During Treatment: Gait belt;Back brace Activity Tolerance: Patient tolerated treatment well Patient left: in chair;with call bell/phone within reach Nurse Communication: Mobility status  08/06/2011 Cipriano Mile OTR/L Pager [phone] Office [phone]  Cipriano Mile 08/06/2011, 2:36 PM

## 2011-08-06 NOTE — Progress Notes (Signed)
Pt. No longer on claritin and naproxen. Wants meds removed from med profile.

## 2011-08-06 NOTE — Care Management Note (Signed)
    Page 1 of 1   08/10/2011     10:18:11 AM   CARE MANAGEMENT NOTE 08/10/2011  Patient:  Jade Cardenas, Jade Cardenas   Account Number:  [id number]  Date Initiated:  08/06/2011  Documentation initiated by:  Onnie Boer  Subjective/Objective Assessment:   PT WAS ADMITTED FOR SURG     Action/Plan:   PROGRESSION OF CARE AND DISCHARGE PLANNING   Anticipated DC Date:  08/08/2011   Anticipated DC Plan:  HOME W HOME HEALTH SERVICES      DC Planning Services  CM consult      Choice offered to / List presented to:             Status of service:  Completed, signed off Medicare Important Message given?   (If response is "NO", the following Medicare IM given date fields will be blank) Date Medicare IM given:   Date Additional Medicare IM given:    Discharge Disposition:  HOME/SELF CARE  Per UR Regulation:  Reviewed for med. necessity/level of care/duration of stay  If discussed at Long Length of Stay Meetings, dates discussed:    Comments:  08/07/11 Ashok Norris, RN, BSN 1016 PT REFUSED DME'S PRIOR TO DC HOME WITH SELF CARE  08/06/11 Onnie Boer, RN, BSN 1526 PT WAS ADMITTED FOR A FUSION AND SCREWS.  PTA PT WAS AT HOME WITH SELF/ FAMILY CARE.  RECOMMENDATIONS ARE FOR HH PT/3N1 AND RW.  WILL F/U ON OTHER DC NEEDS.

## 2011-08-06 NOTE — Evaluation (Signed)
Physical Therapy Evaluation Patient Details Name: Jade Cardenas MRN: [id number] DOB: [birth year] Today's Date: 08/06/2011 Time: 0350-0938 PT Time Calculation (min): 30 min  PT Assessment / Plan / Recommendation Clinical Impression  Pt s/p PLF L4-5 along with the following impairments/deficits and therapy diagnosis. Pt will benefit from skilled PT in the acute care setting in order to maximize functional mobility and safety prior to d/c home    PT Assessment  Patient needs continued PT services    Follow Up Recommendations  Home health PT;Supervision for mobility/OOB    Barriers to Discharge        lEquipment Recommendations  None recommended by PT    Recommendations for Other Services     Frequency Min 5X/week    Precautions / Restrictions Precautions Precautions: Back Precaution Booklet Issued: Yes (comment) Precaution Comments: pt educated on 3/3 back precautions Required Braces or Orthoses: Spinal Brace Spinal Brace: Lumbar corset;Applied in sitting position Restrictions Weight Bearing Restrictions: No         Mobility  Bed Mobility Bed Mobility: Rolling Left;Left Sidelying to Sit;Sitting - Scoot to Edge of Bed Rolling Left: 4: Min guard Left Sidelying to Sit: 4: Min assist;With rails;HOB elevated (slightly elevated) Sitting - Scoot to Edge of Bed: 4: Min guard;With rail Details for Bed Mobility Assistance: VC for proper sequencing to maintain back precautions during transfer. Min assist for trunk control into sitting. Transfers Transfers: Stand to Sit;Sit to Stand Sit to Stand: 4: Min assist;With upper extremity assist;From bed;From chair/3-in-1 Stand to Sit: 4: Min assist;With upper extremity assist;To chair/3-in-1 Details for Transfer Assistance: VC for hand placement and sequencing to maintain back precautions during standing. Min assist for stability and controlled descent. Ambulation/Gait Ambulation/Gait Assistance: 4: Min assist Ambulation Distance  (Feet): 20 Feet (to/from bathroom) Assistive device: 1 person hand held assist Ambulation/Gait Assistance Details: VC for proper sequencing. Will attempt RW next session for increased stability. min assist for stability and support Gait Pattern: Step-to pattern;Decreased stride length;Decreased hip/knee flexion - right;Decreased hip/knee flexion - left;Trunk flexed;Antalgic;Wide base of support Gait velocity: decreased gait speed    Exercises     PT Diagnosis: Difficulty walking;Acute pain  PT Problem List: Decreased activity tolerance;Decreased mobility;Decreased knowledge of use of DME;Decreased safety awareness;Decreased knowledge of precautions;Pain PT Treatment Interventions: DME instruction;Gait training;Stair training;Functional mobility training;Therapeutic activities;Patient/family education   PT Goals Acute Rehab PT Goals PT Goal Formulation: With patient Time For Goal Achievement: 08/13/11 Potential to Achieve Goals: Good Pt will Roll Supine to Right Side: with modified independence PT Goal: Rolling Supine to Right Side - Progress: Goal set today Pt will Roll Supine to Left Side: with modified independence PT Goal: Rolling Supine to Left Side - Progress: Goal set today Pt will go Supine/Side to Sit: with modified independence PT Goal: Supine/Side to Sit - Progress: Goal set today Pt will go Sit to Supine/Side: with modified independence PT Goal: Sit to Supine/Side - Progress: Goal set today Pt will go Sit to Stand: with modified independence PT Goal: Sit to Stand - Progress: Goal set today Pt will go Stand to Sit: with modified independence PT Goal: Stand to Sit - Progress: Goal set today Pt will Transfer Bed to Chair/Chair to Bed: with modified independence PT Transfer Goal: Bed to Chair/Chair to Bed - Progress: Goal set today Pt will Ambulate: >150 feet;with supervision;with least restrictive assistive device PT Goal: Ambulate - Progress: Goal set today Pt will Go Up /  Down Stairs: 1-2 stairs;with supervision;with least restrictive assistive device PT Goal:  Up/Down Stairs - Progress: Goal set today  Visit Information  Last PT Received On: 08/06/11 Assistance Needed: +1 PT/OT Co-Evaluation/Treatment: Yes    Subjective Data      Prior Functioning  Home Living Lives With: Alone Available Help at Discharge: Available 24 hours/day;Friend(s) Type of Home:  (townhouse) Home Access: Stairs to enter [employer] of Steps: 1 Entrance Stairs-Rails: None Home Layout: One level Bathroom Shower/Tub: Glass blower/designer: Handicapped height Bathroom Accessibility: Yes How Accessible: Accessible via walker Home Adaptive Equipment: Shower chair without back;Hand-held shower hose;Straight cane;Walker - rolling Prior Function Level of Independence: Independent Able to Take Stairs?: Yes Driving: Yes Vocation: Full time employment Comments: Producer, 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 difficulties Dominant Hand: Right    Cognition  Overall Cognitive Status: Appears within functional limits for tasks assessed/performed Arousal/Alertness: Awake/alert Orientation Level: Appears intact for tasks assessed Behavior During Session: [facility] for tasks performed    Extremity/Trunk Assessment Right Lower Extremity Assessment RLE ROM/Strength/Tone: Within functional levels RLE Sensation: WFL - Light Touch Left Lower Extremity Assessment LLE ROM/Strength/Tone: Within functional levels LLE Sensation: WFL - Light Touch   Balance    End of Session PT - End of Session Equipment Utilized During Treatment: Gait belt;Back brace Activity Tolerance: Patient tolerated treatment well Patient left: in chair;with call bell/phone within reach Nurse Communication: Mobility status   Milana Kidney 08/06/2011, 1:56 PM  08/06/2011 Milana Kidney DPT PAGER: [phone] OFFICE: [phone]

## 2011-08-07 LAB — CBC
HCT: 23.2 % — ABNORMAL LOW (ref 36.0–46.0)
HCT: 28.1 % — ABNORMAL LOW (ref 36.0–46.0)
Hemoglobin: 7.4 g/dL — ABNORMAL LOW (ref 12.0–15.0)
Hemoglobin: 9.2 g/dL — ABNORMAL LOW (ref 12.0–15.0)
MCH: 25.3 pg — ABNORMAL LOW (ref 26.0–34.0)
MCH: 26.3 pg (ref 26.0–34.0)
MCHC: 31.9 g/dL (ref 30.0–36.0)
MCHC: 32.7 g/dL (ref 30.0–36.0)
MCV: 79.5 fL (ref 78.0–100.0)
MCV: 80.3 fL (ref 78.0–100.0)
Platelets: 319 10*3/uL (ref 150–400)
Platelets: 277 10*3/uL (ref 150–400)
RBC: 2.92 MIL/uL — ABNORMAL LOW (ref 3.87–5.11)
RBC: 3.5 MIL/uL — ABNORMAL LOW (ref 3.87–5.11)
RDW: 15.9 % — ABNORMAL HIGH (ref 11.5–15.5)
RDW: 16 % — ABNORMAL HIGH (ref 11.5–15.5)
WBC: 8.4 10*3/uL (ref 4.0–10.5)
WBC: 10.8 10*3/uL — ABNORMAL HIGH (ref 4.0–10.5)

## 2011-08-07 MED ORDER — OXYCODONE HCL 5 MG PO TABS
5.0000 mg | ORAL_TABLET | ORAL | Status: DC | PRN
  Administered 2011-08-07: 5 mg via ORAL
  Filled 2011-08-07: qty 1

## 2011-08-07 MED FILL — Sodium Chloride IV Soln 0.9%: INTRAVENOUS | Qty: 1000 | Status: AC

## 2011-08-07 MED FILL — Sodium Chloride Irrigation Soln 0.9%: Qty: 3000 | Status: AC

## 2011-08-07 NOTE — Progress Notes (Signed)
Patient's iv came out while receiving blood transfusion, iv restarted and transfusion was continued

## 2011-08-07 NOTE — Progress Notes (Signed)
OT Cancellation Note  Treatment cancelled today due to medical issues with patient which prohibited therapy. Pt HGB 7.4 and awaiting transfusion. Will attempt this afternoon pending medical stability.  08/07/2011 Jade Cardenas OTR/L Pager [phone] Office [phone]

## 2011-08-07 NOTE — Progress Notes (Signed)
HEMOVAC REMOVED FROM PT'S BACK. 5 ML OF BLOODY DRAINAGE EMPTIED. PRESSURE DRESSING APPLIED. NO ACTIVE BLEEDING.  DRY DRESSING REPLACED OVER INCISION AS WELL.  INCISION WITHOUT ANY S/S OF COMPLICATIONS.  STERI-STRIPS INTACT. PT TOLERATED WELL.

## 2011-08-07 NOTE — Progress Notes (Addendum)
PT Cancellation Note  Treatment cancelled today due to medical issues with patient which prohibited therapy. Pt HGB 7.4 and awaiting transfusion. Will attempt this afternoon pending medical stability.   1700: Attempted to see pt again, she is with her friend eating dinner and requested to wait until tomorrow. Will attempt treatment tomorrow.  Thanks, 08/07/2011 Milana Kidney DPT PAGER: [phone] OFFICE: [phone]   Milana Kidney 08/07/2011, 10:35 AM

## 2011-08-07 NOTE — Progress Notes (Signed)
Patient refusing to wear back brace when ambulating to bathroom, patient stated it just gets in the way. Patient was educated on the importance of using brace but still refused to have it applied. Jade Cardenas [employer]

## 2011-08-07 NOTE — Progress Notes (Signed)
Subjective: Patient reports She's feeling overall better with her pain however she is very fatigued she gets walks around the point of a halo but dizzy. I think she is symptomatically her anemia.  Objective: Vital signs in last 24 hours: Temp:  [98.3 F (36.8 C)-98.5 F (36.9 C)] 98.3 F (36.8 C) (06/07 0600) Pulse Rate:  [82-96] 82  (06/07 0600) Resp:  [16-18] 16  (06/07 0600) BP: (88-144)/(52-77) 106/57 mmHg (06/07 0600) SpO2:  [92 %-100 %] 93 % (06/07 0600)  Intake/Output from previous day: 06/06 0701 - 06/07 0700 In: 218 [P.O.:118] Out: 100 [Drains:100] Intake/Output this shift:    awake alert oriented strength 5 out of 5 wound clleann  annd dry  Lab Results:  [facility] 08/07/11 0638 08/06/11 0630  WBC 8.4 9.9  HGB 7.4* 7.4*  HCT 23.2* 22.8*  PLT 319 348   BMET  Basename 08/06/11 0630  NA 139  K 4.3  CL 105  CO2 25  GLUCOSE 134*  BUN 13  CREATININE 0.67  CALCIUM 8.9    Studies/Results: Dg Lumbar Spine 2-3 Views  08/05/2011  *RADIOLOGY REPORT*  Clinical Data: L4-5 FUSION.  DG C-ARM 1-60 MIN,LUMBAR SPINE - 2-3 VIEW  Comparison: MRI 06/22/2011  Findings: Posterior fusion changes at L4-5.  Normal alignment.  No hardware complicating feature.  IMPRESSION: Posterior fusion L4-5.  Original Report Authenticated By: Cyndie Chime, M.D.   Dg C-arm 1-60 Min  08/05/2011  *RADIOLOGY REPORT*  Clinical Data: L4-5 FUSION.  DG C-ARM 1-60 MIN,LUMBAR SPINE - 2-3 VIEW  Comparison: MRI 06/22/2011  Findings: Posterior fusion changes at L4-5.  Normal alignment.  No hardware complicating feature.  IMPRESSION: Posterior fusion L4-5.  Original Report Authenticated By: Cyndie Chime, M.D.    Assessment/Plan: Doing well for fusion perspective however I believe her anemia is getting symptomatic so we will transfuse her 2 units of packed red cells  LOS: 2 days     Curtistine Pettitt P 08/07/2011, 7:48 AM

## 2011-08-07 NOTE — Progress Notes (Signed)
Pt refusing to wear SCD's tonight states they keep her up.  Also refused to wear back brace when RN ambulated her to the restroom.  Pt made aware of importance of both devices and encouraged her to wear, but pt still refused.

## 2011-08-08 LAB — TYPE AND SCREEN
ABO/RH(D): B POS
Antibody Screen: NEGATIVE
Unit division: 0
Unit division: 0

## 2011-08-08 MED ORDER — OXYCODONE-ACETAMINOPHEN 5-325 MG PO TABS: 1.0000 | ORAL_TABLET | ORAL | Status: AC | PRN

## 2011-08-08 MED ORDER — CYCLOBENZAPRINE HCL 10 MG PO TABS: 10.0000 mg | ORAL_TABLET | Freq: Three times a day (TID) | ORAL | Status: AC | PRN

## 2011-08-08 NOTE — Progress Notes (Signed)
Occupational Therapy Treatment Patient Details Name: Jade Cardenas MRN: [id number] DOB: 05-30-1950 Today's Date: 08/08/2011 Time: 8657-8469 OT Time Calculation (min): 13 min  OT Assessment / Plan / Recommendation Comments on Treatment Session Pt progressing well towards goals.  While pt demosntrates a good understanding of back precautions but has been refusing to wear back brace.     Follow Up Recommendations  Supervision - Intermittent    Barriers to Discharge       Equipment Recommendations  None recommended by OT    Recommendations for Other Services    Frequency Min 2X/week   Plan Discharge plan remains appropriate    Precautions / Restrictions Precautions Precautions: Back Precaution Comments: Pt able to recall 3/3 back precautions Required Braces or Orthoses: Spinal Brace (Pt refusing to wear brace.  RN aware) Spinal Brace: Lumbar corset;Applied in sitting position Restrictions Weight Bearing Restrictions: No   Pertinent Vitals/Pain See vitals    ADL  Grooming: Performed;Wash/dry hands;Modified independent Where Assessed - Grooming: Unsupported standing Toilet Transfer: Buyer, retail Method:  (ambulating) Acupuncturist:  (chair) Equipment Used: Rolling walker Transfers/Ambulation Related to ADLs: Supervision with RW ADL Comments: Pt educated on maintaining back precautions and techniques while grooming, LB bathing/dressing, and toileting hygiene.  Pt verbalized good understanding of back precautions.  Pt sitting in chair with legs crossed and able to reach feet while keeping back straight.  Recommended to pt that she use her shower chair to safely bathe lower legs while keeping back straight.    OT Diagnosis:    OT Problem List:   OT Treatment Interventions:     OT Goals ADL Goals Pt Will Perform Grooming: with modified independence;Standing at sink ADL Goal: Grooming - Progress: Met Additional ADL Goal #1: Pt will  perform all components of toileting ADL with mod I while maintaining back safety precautions. ADL Goal: Additional Goal #1 - Progress: Progressing toward goals Miscellaneous OT Goals Miscellaneous OT Goal #2: Pt will independently verbalize and demonstrate 3/3 back precautions during all ADL activity. OT Goal: Miscellaneous Goal #2 - Progress: Progressing toward goals  Visit Information  Last OT Received On: 08/08/11    Subjective Data      Prior Functioning       Cognition  Overall Cognitive Status: Appears within functional limits for tasks assessed/performed Arousal/Alertness: Awake/alert Orientation Level: Appears intact for tasks assessed Behavior During Session: [facility] for tasks performed    Mobility Bed Mobility Bed Mobility: Not assessed Transfers Stand to Sit: 5: Supervision;To chair/3-in-1;With armrests;With upper extremity assist Details for Transfer Assistance: Pt demonstrates good hand placement before beginning descent.   Exercises    Balance    End of Session OT - End of Session Activity Tolerance: Patient tolerated treatment well Patient left: in chair;with call bell/phone within reach Nurse Communication: Mobility status (refusal to wear brace)  08/08/2011 Cipriano Mile OTR/L Pager [phone] Office [phone]  Cipriano Mile 08/08/2011, 9:36 AM

## 2011-08-08 NOTE — Progress Notes (Signed)
Physical Therapy Treatment Patient Details Name: Jade Cardenas MRN: [id number] DOB: 1950/07/05 Today's Date: 08/08/2011 Time: 4098-1191 PT Time Calculation (min): 12 min  PT Assessment / Plan / Recommendation Comments on Treatment Session  Pt progressing well. Pt ambulated >150 ft with no physical assist needed. Reminders throughout session for back precautions.    Follow Up Recommendations  Home health PT;Supervision for mobility/OOB       Equipment Recommendations  None recommended by PT;None recommended by OT       Frequency Min 5X/week   Plan Discharge plan remains appropriate;Frequency remains appropriate    Precautions / Restrictions Precautions Precautions: Back Precaution Comments: Pt able to verbalize and maintain 3/3 back precautions Required Braces or Orthoses: Spinal Brace Spinal Brace: Lumbar corset;Applied in sitting position Restrictions Weight Bearing Restrictions: No       Mobility  Bed Mobility Bed Mobility: Not assessed Transfers Transfers: Stand to Sit;Sit to Stand Sit to Stand: 5: Supervision;With upper extremity assist;From bed Stand to Sit: 5: Supervision;With upper extremity assist;To chair/3-in-1 Details for Transfer Assistance: Supervision for safety. VC for proper sequencing as pt is still having difficulty with bending while standing. Ambulation/Gait Ambulation/Gait Assistance: 5: Supervision Ambulation Distance (Feet): 150 Feet Assistive device: Rolling walker Ambulation/Gait Assistance Details: VC for safety with distance to RW. No physical assist needed Gait Pattern: Step-to pattern;Decreased stride length;Decreased hip/knee flexion - right;Decreased hip/knee flexion - left;Trunk flexed;Antalgic;Wide base of support Gait velocity: decreased gait speed     PT Goals Acute Rehab PT Goals PT Goal: Sit to Stand - Progress: Progressing toward goal PT Goal: Stand to Sit - Progress: Progressing toward goal PT Transfer Goal: Bed to  Chair/Chair to Bed - Progress: Progressing toward goal PT Goal: Ambulate - Progress: Progressing toward goal  Visit Information  Last PT Received On: 08/08/11 Assistance Needed: +1    Subjective Data      Cognition  Overall Cognitive Status: Appears within functional limits for tasks assessed/performed Arousal/Alertness: Awake/alert Orientation Level: Appears intact for tasks assessed Behavior During Session: [facility] for tasks performed    Balance     End of Session PT - End of Session Equipment Utilized During Treatment: Gait belt;Other (comment) (pt declined back brace) Activity Tolerance: Patient tolerated treatment well Patient left: in chair;with call bell/phone within reach Nurse Communication: Mobility status    Milana Kidney 08/08/2011, 10:36 AM  08/08/2011 Milana Kidney DPT PAGER: [phone] OFFICE: [phone]

## 2011-08-08 NOTE — Discharge Summary (Signed)
Physician Discharge Summary  Patient ID: Jade Cardenas MRN: [id number] DOB/AGE: 08/14/1950 61 y.o.  Admit date: 08/05/2011 Discharge date: 08/08/2011  Admission Diagnoses: Lumbar spondylosis and stenosis with neurogenic claudication, instability L4-L5  Discharge Diagnoses: Lumbar spondylosis and stenosis with neurogenic claudication, instability L4-L5 Principal Problem:  *Lumbar stenosis with neurogenic claudication   Discharged Condition: good  Hospital Course: Patient was admitted to undergo surgical decompression arthrodesis she underwent the surgery and tolerated it well. On admission she was anemic and postoperatively she had even further anemia this was treated with 2 units of red blood cells transfused to her.  Consults: None  Significant Diagnostic Studies: None  Treatments: surgery: Decompression of L4-L5 posterior interbody arthrodesis with peek spacers pedicle screw fixation posterior lateral arthrodesis L4-L5  Discharge Exam: Blood pressure 89/67, pulse 101, temperature 98.6 F (37 C), temperature source Oral, resp. rate 18, SpO2 96.00%.n is clean and dry motor function   Incision is clean and dry motor function is intact in lower extremities. Disposition: 01-Home or Self Care  Discharge Orders    Future Orders Please Complete By Expires   Diet - low sodium heart healthy      Increase activity slowly      Discharge instructions      Comments:   Sit straight walk straight stand straight mind your posture . Okay to shower.   Call MD for:  redness, tenderness, or signs of infection (pain, swelling, redness, odor or green/yellow discharge around incision site)      Call MD for:  severe uncontrolled pain      Call MD for:  temperature >100.4        Medication List  As of 08/08/2011 10:16 AM   TAKE these medications         acetaminophen 650 MG CR tablet   Commonly known as: TYLENOL   Take 650 mg by mouth every 8 (eight) hours as needed.      celecoxib 100 MG  capsule   Commonly known as: CELEBREX   Take 1 capsule (100 mg total) by mouth 2 (two) times daily as needed for pain.      cyclobenzaprine 5 MG tablet   Commonly known as: FLEXERIL   Take 2.5 mg by mouth at bedtime as needed.      cyclobenzaprine 10 MG tablet   Commonly known as: FLEXERIL   Take 1 tablet (10 mg total) by mouth 3 (three) times daily as needed for muscle spasms.      enalapril 10 MG tablet   Commonly known as: VASOTEC   Take 5 mg by mouth 2 (two) times daily.      esomeprazole 40 MG capsule   Commonly known as: NEXIUM   Take 40 mg by mouth every morning before breakfast.      estradiol 0.0375 MG/24HR   Commonly known as: VIVELLE-DOT   Place 1 patch onto the skin twice a week.      fluocinonide ointment 0.05 %   Commonly known as: LIDEX   Apply topically 2 (two) times daily.      loratadine 10 MG tablet   Commonly known as: CLARITIN   Take 10 mg by mouth daily.      lovastatin 20 MG tablet   Commonly known as: MEVACOR   Take 1 tablet (20 mg total) by mouth at bedtime.      multivitamin tablet   Take 1 tablet by mouth daily.      naproxen sodium 220 MG tablet  Commonly known as: ANAPROX   Take 220 mg by mouth 2 (two) times daily with a meal.      oxyCODONE-acetaminophen 5-325 MG per tablet   Commonly known as: PERCOCET   Take 1-2 tablets by mouth every 4 (four) hours as needed for pain.      TYLENOL PM EXTRA STRENGTH PO   Take 1 tablet by mouth at bedtime as needed. For sleep             Signed: Stefani Dama 08/08/2011, 10:16 AM

## 2011-09-02 ENCOUNTER — Encounter: Payer: Self-pay | Admitting: Internal Medicine

## 2011-09-02 ENCOUNTER — Ambulatory Visit (INDEPENDENT_AMBULATORY_CARE_PROVIDER_SITE_OTHER): Payer: No Typology Code available for payment source | Admitting: Internal Medicine

## 2011-09-02 VITALS — BP 126/70 | HR 111 | Temp 98.1°F | Wt 204.0 lb

## 2011-09-02 DIAGNOSIS — T887XXA Unspecified adverse effect of drug or medicament, initial encounter: Secondary | ICD-10-CM

## 2011-09-02 DIAGNOSIS — I10 Essential (primary) hypertension: Secondary | ICD-10-CM

## 2011-09-02 DIAGNOSIS — Z981 Arthrodesis status: Secondary | ICD-10-CM

## 2011-09-02 DIAGNOSIS — T50995A Adverse effect of other drugs, medicaments and biological substances, initial encounter: Secondary | ICD-10-CM

## 2011-09-02 DIAGNOSIS — D649 Anemia, unspecified: Secondary | ICD-10-CM

## 2011-09-02 DIAGNOSIS — Z9189 Other specified personal risk factors, not elsewhere classified: Secondary | ICD-10-CM

## 2011-09-02 DIAGNOSIS — Z299 Encounter for prophylactic measures, unspecified: Secondary | ICD-10-CM

## 2011-09-02 DIAGNOSIS — M459 Ankylosing spondylitis of unspecified sites in spine: Secondary | ICD-10-CM

## 2011-09-02 DIAGNOSIS — M48062 Spinal stenosis, lumbar region with neurogenic claudication: Secondary | ICD-10-CM

## 2011-09-02 DIAGNOSIS — Z9289 Personal history of other medical treatment: Secondary | ICD-10-CM

## 2011-09-02 DIAGNOSIS — E785 Hyperlipidemia, unspecified: Secondary | ICD-10-CM

## 2011-09-02 LAB — CBC WITH DIFFERENTIAL/PLATELET
Basophils Absolute: 0 10*3/uL (ref 0.0–0.1)
Basophils Relative: 0.6 % (ref 0.0–3.0)
Eosinophils Absolute: 0.1 10*3/uL (ref 0.0–0.7)
Eosinophils Relative: 1.5 % (ref 0.0–5.0)
HCT: 32.5 % — ABNORMAL LOW (ref 36.0–46.0)
Hemoglobin: 10.4 g/dL — ABNORMAL LOW (ref 12.0–15.0)
Lymphocytes Relative: 32.7 % (ref 12.0–46.0)
Lymphs Abs: 1.7 10*3/uL (ref 0.7–4.0)
MCHC: 31.9 g/dL (ref 30.0–36.0)
MCV: 83.6 fl (ref 78.0–100.0)
Monocytes Absolute: 0.3 10*3/uL (ref 0.1–1.0)
Monocytes Relative: 5.3 % (ref 3.0–12.0)
Neutro Abs: 3.1 10*3/uL (ref 1.4–7.7)
Neutrophils Relative %: 59.9 % (ref 43.0–77.0)
Platelets: 326 10*3/uL (ref 150.0–400.0)
RBC: 3.89 Mil/uL (ref 3.87–5.11)
RDW: 17.7 % — ABNORMAL HIGH (ref 11.5–14.6)
WBC: 5.2 10*3/uL (ref 4.5–10.5)

## 2011-09-02 LAB — BASIC METABOLIC PANEL
BUN: 16 mg/dL (ref 6–23)
CO2: 27 mEq/L (ref 19–32)
Calcium: 9.2 mg/dL (ref 8.4–10.5)
Chloride: 103 mEq/L (ref 96–112)
Creatinine, Ser: 0.8 mg/dL (ref 0.4–1.2)
GFR: 80.27 mL/min (ref >60.00)
Glucose, Bld: 103 mg/dL — ABNORMAL HIGH (ref 70–99)
Potassium: 4.4 mEq/L (ref 3.5–5.1)
Sodium: 138 mEq/L (ref 135–145)

## 2011-09-02 LAB — LIPID PANEL
Cholesterol: 177 mg/dL (ref 0–200)
HDL: 57.3 mg/dL (ref >39.00)
LDL Cholesterol: 94 mg/dL (ref 0–99)
Total CHOL/HDL Ratio: 3
Triglycerides: 128 mg/dL (ref 0.0–149.0)
VLDL: 25.6 mg/dL (ref 0.0–40.0)

## 2011-09-02 LAB — VITAMIN B12: Vitamin B-12: 598 pg/mL (ref 211–911)

## 2011-09-02 LAB — HEPATIC FUNCTION PANEL
ALT: 11 U/L (ref 0–35)
AST: 18 U/L (ref 0–37)
Albumin: 3.7 g/dL (ref 3.5–5.2)
Alkaline Phosphatase: 81 U/L (ref 39–117)
Bilirubin, Direct: 0 mg/dL (ref 0.0–0.3)
Total Bilirubin: 0.7 mg/dL (ref 0.3–1.2)
Total Protein: 7.3 g/dL (ref 6.0–8.3)

## 2011-09-02 LAB — IBC PANEL
Iron: 42 ug/dL (ref 42–145)
Saturation Ratios: 11.4 % — ABNORMAL LOW (ref 20.0–50.0)
Transferrin: 262.3 mg/dL (ref 212.0–360.0)

## 2011-09-02 LAB — TSH: TSH: 0.63 u[IU]/mL (ref 0.35–5.50)

## 2011-09-02 MED ORDER — ENALAPRIL MALEATE 5 MG PO TABS: 5.0000 mg | ORAL_TABLET | Freq: Two times a day (BID) | ORAL | Status: DC

## 2011-09-02 MED ORDER — LOVASTATIN 20 MG PO TABS: 20.0000 mg | ORAL_TABLET | Freq: Every day | ORAL | Status: DC

## 2011-09-02 NOTE — Progress Notes (Signed)
Subjective:    Patient ID: Jade Cardenas, female    DOB: 16-Aug-1950, 61 y.o.   MRN: [id number]  HPI Patient comes in today for follow up of  multiple medical problems.  And med evaluation  Since last  viist had her back surgery  l4 l5   Was multilevel disease and required transfusion as was anemic pre op. 7.4 and 7.2 post op . Had gyne check and dr Hyacinth Meeker added Arnette Schaumann F for the last 2 weeks. Told she has ankylosing spondylitis .   Based on her bones  Has plantar fasciitis .  To have panendoscopy  GI    To check for cause of the anemia . At one time she was given naproxyn but not on a nsaid.  No visible blood in stool . HT stable throughout although was low in hospital . Taking bid vasotec  LIPIDS no se of meds  Due for labs  Review of Systems Tired no fever no cp sob.  No active bleeding.   No weight loss.  Taking pain meds oxycodone bid if needed.  No falling no weakness. NO resp issues or neuro changes.  Past history family history social history reviewed in the electronic medical record. rx for ra then DJD per rheum. Outpatient Encounter Prescriptions as of 09/02/2011  Medication Sig Dispense Refill  . acetaminophen (TYLENOL) 650 MG CR tablet Take 650 mg by mouth every 8 (eight) hours as needed.      . cyclobenzaprine (FLEXERIL) 5 MG tablet Take 2.5 mg by mouth at bedtime as needed.        . Diphenhydramine-APAP, sleep, (TYLENOL PM EXTRA STRENGTH PO) Take 1 tablet by mouth at bedtime as needed. For sleep      . enalapril (VASOTEC) 10 MG tablet Take 5 mg by mouth 2 (two) times daily.      Marland Kitchen esomeprazole (NEXIUM) 40 MG capsule Take 40 mg by mouth every morning before breakfast.        . estradiol (VIVELLE-DOT) 0.0375 MG/24HR Place 1 patch onto the skin twice a week.        . fluocinonide (LIDEX) 0.05 % ointment Apply topically 2 (two) times daily.        Marland Kitchen loratadine (CLARITIN) 10 MG tablet Take 10 mg by mouth daily.        Marland Kitchen lovastatin (MEVACOR) 20 MG tablet Take 1 tablet (20 mg  total) by mouth at bedtime.  90 tablet  3  . Multiple Vitamin (MULTIVITAMIN) tablet Take 1 tablet by mouth daily.        Marland Kitchen DISCONTD: lovastatin (MEVACOR) 20 MG tablet Take 1 tablet (20 mg total) by mouth at bedtime.  90 tablet  0  . enalapril (VASOTEC) 5 MG tablet Take 1 tablet (5 mg total) by mouth 2 (two) times daily.  180 tablet  3  . DISCONTD: celecoxib (CELEBREX) 100 MG capsule Take 1 capsule (100 mg total) by mouth 2 (two) times daily as needed for pain.  30 capsule  0  . DISCONTD: naproxen sodium (ANAPROX) 220 MG tablet Take 220 mg by mouth 2 (two) times daily with a meal.             Objective:   Physical Exam BP 126/70  Pulse 111  Temp 98.1 F (36.7 C) (Oral)  Wt 204 lb (92.534 kg)  SpO2 98% WDWN in nad with cane  Walks well  Once arises  Oriented x 3 and no noted deficits in memory, attention, and speech. Chest:  Clear to A&P without wheezes rales or rhonchi CV:  S1-S2 no gallops or murmurs peripheral perfusion is normal No clubbing cyanosis or edema Review of last labs June.      Assessment & Plan:  S/p LS  Surgery   Residual  fatigue ;does not seem unexpected for situation. Should improve Anemia pre and post surgery sp transfusion.  On iron supp needs recheck. To get GI work up. Avoid nsaids.  Poss Ak  With djd to fu with Dr Kellie Simmering HT controlled   Ok to change to 5 mg bid for ease of use. LIPIDS  Check today and refill med.

## 2011-09-02 NOTE — Patient Instructions (Signed)
Will notify you  of labs when available.  FU depending on labs and how you are doing  Poss 6 months.

## 2011-09-03 ENCOUNTER — Encounter: Payer: Self-pay | Admitting: Internal Medicine

## 2011-09-03 DIAGNOSIS — Z9289 Personal history of other medical treatment: Secondary | ICD-10-CM | POA: Insufficient documentation

## 2011-09-03 DIAGNOSIS — M459 Ankylosing spondylitis of unspecified sites in spine: Secondary | ICD-10-CM | POA: Insufficient documentation

## 2011-09-03 DIAGNOSIS — Z981 Arthrodesis status: Secondary | ICD-10-CM | POA: Insufficient documentation

## 2011-09-03 DIAGNOSIS — D649 Anemia, unspecified: Secondary | ICD-10-CM | POA: Insufficient documentation

## 2011-09-03 DIAGNOSIS — Z299 Encounter for prophylactic measures, unspecified: Secondary | ICD-10-CM | POA: Insufficient documentation

## 2011-09-03 HISTORY — DX: Arthrodesis status: Z98.1

## 2012-01-20 ENCOUNTER — Encounter: Payer: Self-pay | Admitting: Internal Medicine

## 2012-01-20 ENCOUNTER — Ambulatory Visit (INDEPENDENT_AMBULATORY_CARE_PROVIDER_SITE_OTHER): Payer: No Typology Code available for payment source | Admitting: Internal Medicine

## 2012-01-20 VITALS — BP 160/86 | HR 76 | Temp 97.8°F | Wt 206.0 lb

## 2012-01-20 DIAGNOSIS — R053 Chronic cough: Secondary | ICD-10-CM

## 2012-01-20 DIAGNOSIS — L989 Disorder of the skin and subcutaneous tissue, unspecified: Secondary | ICD-10-CM

## 2012-01-20 DIAGNOSIS — R635 Abnormal weight gain: Secondary | ICD-10-CM

## 2012-01-20 DIAGNOSIS — Z872 Personal history of diseases of the skin and subcutaneous tissue: Secondary | ICD-10-CM

## 2012-01-20 DIAGNOSIS — Z8719 Personal history of other diseases of the digestive system: Secondary | ICD-10-CM

## 2012-01-20 DIAGNOSIS — M129 Arthropathy, unspecified: Secondary | ICD-10-CM

## 2012-01-20 DIAGNOSIS — R51 Headache: Secondary | ICD-10-CM

## 2012-01-20 DIAGNOSIS — R05 Cough: Secondary | ICD-10-CM

## 2012-01-20 DIAGNOSIS — I10 Essential (primary) hypertension: Secondary | ICD-10-CM

## 2012-01-20 DIAGNOSIS — R059 Cough, unspecified: Secondary | ICD-10-CM

## 2012-01-20 MED ORDER — OLMESARTAN MEDOXOMIL 20 MG PO TABS: ORAL_TABLET | ORAL | Status: DC

## 2012-01-20 NOTE — Progress Notes (Signed)
Chief Complaint  Patient presents with  . Headache    Has a rash on her scalp that she would like to be looked at.  . Hypertension    HPI: Patient comes in today for follow up of  multiple medical problems.   Patch on scalp.  Seen by rheum not itchy using steroid for 1-2 weeks  clobetosol    For scalp patch for a few weeks.   Getting better.   Onset  With a bump.    Dx with ulcer and in October   As cause of iron defic anemia BP  Was 190 range  And at rheum was still high  . At home was.    A bit less  Pt thinks bp would be better if not at work . Cause of business and stress.   Has for a few months   otc excedrin cant take it   Migraines  Remote hx of fioricet.  That helped also .    And coughing  at night.  Ongoing no sob  On acei   Hydrocodone use   Sometimes  Once every 2 weeks.  For ha or other   On prednisone for flare about 7.5 pred ROS: See pertinent positives and negatives per HPI. No current bleeding cp sob new weight has not been controlled     Past Medical History  Diagnosis Date  . HYPERLIPIDEMIA 12/29/2006    takes Lovastatin nightly  . Right bundle branch block 02/09/2007  . IBS 04/28/2007  . MENOPAUSAL DISORDER 12/07/2007  . Rheumatoid arthritis 12/29/2006  . SYNCOPE 12/07/2007  . WEIGHT GAIN 12/18/2008  . Palpitations 02/09/2007  . UNS ADVRS EFF OTH RX MEDICINAL\T\BIOLOGICAL SBSTNC 02/09/2007  . GASTRIC ULCER, ACUTE, HEMORRHAGE, HX OF 02/09/2007  . COLONIC POLYPS, HX OF 02/09/2007  . REDUCTION MAMMOPLASTY, HX OF 02/09/2007  . HYPERTENSION 12/29/2006    takes Enalapril daily  . Headache     occasionally  . CHOLECYSTECTOMY, HX OF   . Fibromyalgia     doesn't require meds  . Joint pain   . Joint swelling   . Chronic back pain   . Bruises easily   . H/O hiatal hernia   . GERD (gastroesophageal reflux disease)     takes Nexium daily  . UTI (lower urinary tract infection)     hx of;saw urologist last yr and he told her she was over reacting and not a  big deal   . Anemia   . Hx of seasonal allergies     takes Claritin daily  . Insomnia     related to pain;takes Flexeril and Tylenol PM nightly  . S/P lumbar fusion 6 13 09/03/2011    L4 L5  posterior     Family History  Problem Relation Age of Onset  . Diabetes Mother   . Colon cancer Mother   . Arthritis Mother   . Hypertension Mother   . Stroke Father   . Heart disease Father   . Arthritis Brother   . Hypertension Brother   . Hyperlipidemia Brother   . Anesthesia problems Neg Hx   . Hypotension Neg Hx   . Malignant hyperthermia Neg Hx   . Pseudochol deficiency Neg Hx     History   Social History  . Marital Status: Widowed    Spouse Name: N/A    Number of Children: N/A  . Years of Education: N/A   Social History Main Topics  . Smoking status: Never Smoker   .  Smokeless tobacco: None  . Alcohol Use: Yes     Comment: Once a year for her birthday  . Drug Use: No  . Sexually Active: No   Other Topics Concern  . None   Social History Narrative   Works fund raising and organizing non profitsWidow  Husband died suddenly 2010Non smoker    Outpatient Encounter Prescriptions as of 01/20/2012  Medication Sig Dispense Refill  . acetaminophen (TYLENOL) 650 MG CR tablet Take 650 mg by mouth every 8 (eight) hours as needed.      . clobetasol cream (TEMOVATE) 0.05 % Apply 1 application topically 2 (two) times daily.      . cyclobenzaprine (FLEXERIL) 5 MG tablet Take 2.5 mg by mouth at bedtime as needed.        . Diphenhydramine-APAP, sleep, (TYLENOL PM EXTRA STRENGTH PO) Take 1 tablet by mouth at bedtime as needed. For sleep      . esomeprazole (NEXIUM) 40 MG capsule Take 40 mg by mouth every morning before breakfast.        . estradiol (VIVELLE-DOT) 0.0375 MG/24HR Place 1 patch onto the skin twice a week.        . lovastatin (MEVACOR) 20 MG tablet Take 1 tablet (20 mg total) by mouth at bedtime.  90 tablet  3  . Multiple Vitamin (MULTIVITAMIN) tablet Take 1 tablet by mouth  daily.        . enalapril (VASOTEC) 5 MG tablet Take 1 tablet (5 mg total) by mouth 2 (two) times daily.  180 tablet  3  . olmesartan (BENICAR) 20 MG tablet Take 1 per day and increase to 2 per day as directed  30 tablet  0  . [DISCONTINUED] enalapril (VASOTEC) 10 MG tablet Take 5 mg by mouth 2 (two) times daily.      . [DISCONTINUED] fluocinonide (LIDEX) 0.05 % ointment Apply topically 2 (two) times daily.        . [DISCONTINUED] loratadine (CLARITIN) 10 MG tablet Take 10 mg by mouth daily.          EXAM:  BP 160/86  Pulse 76  Temp 97.8 F (36.6 C) (Oral)  Wt 206 lb (93.441 kg)  There is no height on file to calculate BMI.  GENERAL: vitals reviewed and listed above, alert, oriented, appears well hydrated and in no acute distress  HEENT: atraumatic, conjunctiva  clear, no obvious abnormalities on inspection of external nose and ears OP : no lesion edema or exudate   NECK: no obvious masses on inspection palpation   LUNGS: clear to auscultation bilaterally, no wheezes, rales or rhonchi, good air movement  CV: HRRR, no clubbing cyanosis or  peripheral edema nl cap refill  Skin scalp left frontal are with 2 cm faint pink salmoln colored smooth depressed feeling lesion no scale ? If alopecic.  MS: moves all extremities without noticeable focal  abnormality  PSYCH: pleasant and cooperative, no obvious depression or anxiety  ASSESSMENT AND PLAN:  Discussed the following assessment and plan:  1. HYPERTENSION     -Patient advised to return or notify health care team  immediately if symptoms worsen or persist or new concerns arise.  Patient Instructions  We should change Bp medication  .   Take 20 mg benicar and increase to 40 mg if tolerated  Could help with the cough    Calendar the headaches and then plan rov in 1 months.   Use steroid topical for rash for another 1-2 weeks and if not gone  see deratology  longe term use of med can cause skin changes      [name]. [name]  M.D.

## 2012-01-20 NOTE — Patient Instructions (Signed)
We should change Bp medication  .   Take 20 mg benicar and increase to 40 mg if tolerated  Could help with the cough    Calendar the headaches and then plan rov in 1 months.   Use steroid topical for rash for another 1-2 weeks and if not gone see deratology  longe term use of med can cause skin changes

## 2012-01-21 DIAGNOSIS — R635 Abnormal weight gain: Secondary | ICD-10-CM | POA: Insufficient documentation

## 2012-01-21 DIAGNOSIS — L989 Disorder of the skin and subcutaneous tissue, unspecified: Secondary | ICD-10-CM | POA: Insufficient documentation

## 2012-01-21 DIAGNOSIS — Z872 Personal history of diseases of the skin and subcutaneous tissue: Secondary | ICD-10-CM | POA: Insufficient documentation

## 2012-01-21 DIAGNOSIS — R05 Cough: Secondary | ICD-10-CM

## 2012-01-21 DIAGNOSIS — R51 Headache: Secondary | ICD-10-CM | POA: Insufficient documentation

## 2012-01-21 DIAGNOSIS — R053 Chronic cough: Secondary | ICD-10-CM

## 2012-01-21 DIAGNOSIS — R519 Headache, unspecified: Secondary | ICD-10-CM | POA: Insufficient documentation

## 2012-01-21 HISTORY — DX: Chronic cough: R05.3

## 2012-01-21 HISTORY — DX: Cough: R05

## 2012-02-22 ENCOUNTER — Encounter: Payer: Self-pay | Admitting: Internal Medicine

## 2012-02-22 ENCOUNTER — Ambulatory Visit (INDEPENDENT_AMBULATORY_CARE_PROVIDER_SITE_OTHER): Payer: No Typology Code available for payment source | Admitting: Internal Medicine

## 2012-02-22 VITALS — BP 136/84 | HR 63 | Temp 97.6°F | Wt 215.0 lb

## 2012-02-22 DIAGNOSIS — I10 Essential (primary) hypertension: Secondary | ICD-10-CM

## 2012-02-22 DIAGNOSIS — Z981 Arthrodesis status: Secondary | ICD-10-CM

## 2012-02-22 DIAGNOSIS — R51 Headache: Secondary | ICD-10-CM

## 2012-02-22 DIAGNOSIS — D649 Anemia, unspecified: Secondary | ICD-10-CM

## 2012-02-22 DIAGNOSIS — M129 Arthropathy, unspecified: Secondary | ICD-10-CM

## 2012-02-22 LAB — BASIC METABOLIC PANEL
BUN: 15 mg/dL (ref 6–23)
CO2: 31 mEq/L (ref 19–32)
Calcium: 9.4 mg/dL (ref 8.4–10.5)
Chloride: 104 mEq/L (ref 96–112)
Creatinine, Ser: 0.7 mg/dL (ref 0.4–1.2)
GFR: 93.89 mL/min (ref >60.00)
Glucose, Bld: 87 mg/dL (ref 70–99)
Potassium: 4.9 mEq/L (ref 3.5–5.1)
Sodium: 139 mEq/L (ref 135–145)

## 2012-02-22 LAB — CBC WITH DIFFERENTIAL/PLATELET
Basophils Absolute: 0 10*3/uL (ref 0.0–0.1)
Basophils Relative: 0.5 % (ref 0.0–3.0)
Eosinophils Absolute: 0 10*3/uL (ref 0.0–0.7)
Eosinophils Relative: 0.8 % (ref 0.0–5.0)
HCT: 32.9 % — ABNORMAL LOW (ref 36.0–46.0)
Hemoglobin: 11.1 g/dL — ABNORMAL LOW (ref 12.0–15.0)
Lymphocytes Relative: 34.4 % (ref 12.0–46.0)
Lymphs Abs: 2.1 10*3/uL (ref 0.7–4.0)
MCHC: 33.8 g/dL (ref 30.0–36.0)
MCV: 86.7 fl (ref 78.0–100.0)
Monocytes Absolute: 0.4 10*3/uL (ref 0.1–1.0)
Monocytes Relative: 6.4 % (ref 3.0–12.0)
Neutro Abs: 3.6 10*3/uL (ref 1.4–7.7)
Neutrophils Relative %: 57.9 % (ref 43.0–77.0)
Platelets: 361 10*3/uL (ref 150.0–400.0)
RBC: 3.79 Mil/uL — ABNORMAL LOW (ref 3.87–5.11)
RDW: 14.6 % (ref 11.5–14.6)
WBC: 6.2 10*3/uL (ref 4.5–10.5)

## 2012-02-22 MED ORDER — OLMESARTAN MEDOXOMIL 20 MG PO TABS: ORAL_TABLET | ORAL | Status: DC

## 2012-02-22 NOTE — Progress Notes (Signed)
Chief Complaint  Patient presents with  . Follow-up  . Hypertension  . Headache    HPI: Here for fu of bp and headaches .   She is calendar her headache since last visit and sometimes will have 3 or 4 days in a row within the morning she is noted that her triggers areStress  And some odors     Perfumes etc.  djd has been problematic but may needs surgery  In  A few years. She just finished having back surgery. Notices Weather changes in back pain.  Her job is very active and stressful her to accomplish everything with her physical disability. Was considering looking into disability. She likes her job however and does a lot of things. Blood pressure seems to be doing better she has monitor these and they're coming down mostly in the goal range below 140/90 and occasional elevation. She's taking samples of Benicar 20 mg a day without side effects her cough has subsided. ROS: See pertinent positives and negatives per HPI.    Past Medical History  Diagnosis Date  . HYPERLIPIDEMIA 12/29/2006    takes Lovastatin nightly  . Right bundle branch block 02/09/2007  . IBS 04/28/2007  . MENOPAUSAL DISORDER 12/07/2007  . Rheumatoid arthritis 12/29/2006  . SYNCOPE 12/07/2007  . WEIGHT GAIN 12/18/2008  . Palpitations 02/09/2007  . UNS ADVRS EFF OTH RX MEDICINAL\T\BIOLOGICAL SBSTNC 02/09/2007  . GASTRIC ULCER, ACUTE, HEMORRHAGE, HX OF 02/09/2007  . COLONIC POLYPS, HX OF 02/09/2007  . REDUCTION MAMMOPLASTY, HX OF 02/09/2007  . HYPERTENSION 12/29/2006    takes Enalapril daily  . Headache     occasionally  . CHOLECYSTECTOMY, HX OF   . Fibromyalgia     doesn't require meds  . Joint pain   . Joint swelling   . Chronic back pain   . Bruises easily   . H/O hiatal hernia   . GERD (gastroesophageal reflux disease)     takes Nexium daily  . UTI (lower urinary tract infection)     hx of;saw urologist last yr and he told her she was over reacting and not a big deal   . Anemia   . Hx of seasonal  allergies     takes Claritin daily  . Insomnia     related to pain;takes Flexeril and Tylenol PM nightly  . S/P lumbar fusion 6 13 09/03/2011    L4 L5  posterior     Family History  Problem Relation Age of Onset  . Diabetes Mother   . Colon cancer Mother   . Arthritis Mother   . Hypertension Mother   . Stroke Father   . Heart disease Father   . Arthritis Brother   . Hypertension Brother   . Hyperlipidemia Brother   . Anesthesia problems Neg Hx   . Hypotension Neg Hx   . Malignant hyperthermia Neg Hx   . Pseudochol deficiency Neg Hx     History   Social History  . Marital Status: Widowed    Spouse Name: N/A    Number of Children: N/A  . Years of Education: N/A   Social History Main Topics  . Smoking status: Never Smoker   . Smokeless tobacco: None  . Alcohol Use: Yes     Comment: Once a year for her birthday  . Drug Use: No  . Sexually Active: No   Other Topics Concern  . None   Social History Narrative   Works Metallurgist and organizing non profitsWidow  Husband  died suddenly 2010Non smoker    Outpatient Encounter Prescriptions as of 02/22/2012  Medication Sig Dispense Refill  . acetaminophen (TYLENOL) 650 MG CR tablet Take 1,300 mg by mouth daily.       . clobetasol cream (TEMOVATE) 0.05 % Apply 1 application topically 2 (two) times daily.      . cyclobenzaprine (FLEXERIL) 5 MG tablet Take 2.5 mg by mouth at bedtime as needed.        . Diphenhydramine-APAP, sleep, (TYLENOL PM EXTRA STRENGTH PO) Take 1 tablet by mouth at bedtime as needed. For sleep      . esomeprazole (NEXIUM) 40 MG capsule Take 40 mg by mouth every morning before breakfast.        . estradiol (VIVELLE-DOT) 0.0375 MG/24HR Place 1 patch onto the skin twice a week.        . lovastatin (MEVACOR) 20 MG tablet Take 1 tablet (20 mg total) by mouth at bedtime.  90 tablet  3  . Multiple Vitamin (MULTIVITAMIN) tablet Take 1 tablet by mouth daily.        Marland Kitchen olmesartan (BENICAR) 20 MG tablet Take 1 per  day and increase to 2 per day as directed  180 tablet  3  . [DISCONTINUED] olmesartan (BENICAR) 20 MG tablet Take 1 per day and increase to 2 per day as directed  30 tablet  0    EXAM:  BP 136/84  Pulse 63  Temp 97.6 F (36.4 C) (Oral)  Wt 215 lb (97.523 kg)  SpO2 99%  There is no height on file to calculate BMI.  GENERAL: vitals reviewed and listed above, alert, oriented, appears well hydrated and in no acute distress  HEENT: atraumatic, conjunctiva  clear, no obvious abnormalities on inspection of external nose and ears OP : no lesion edema or exudate  NECK: no obvious masses on inspection palpation   CV: HRRR, no clubbing cyanosis or  peripheral edema nl cap refill  MS: moves all extremities without noticeable focal  Abnormality has a slightly antalgic gait rises slowly.  PSYCH: pleasant and cooperative, no obvious depression or anxiety Headache diary and blood pressure diary reviewed. ASSESSMENT AND PLAN:  Discussed the following assessment and plan:  1. HYPERTENSION  Basic metabolic panel, CBC with Differential   Much improved on this medication the cough appears to be gone from acei continue  2. ARTHRITIS  Basic metabolic panel, CBC with Differential   probelmatic   3. Anemia  Basic metabolic panel, CBC with Differential   needs fu   4. Headache     prblematic hx migraine consider conteroller med or HA evaluation  5. S/P lumbar fusion 6 13     Patient's asks about Fioricet as she has used this in the remote past however this is also a rescue medicine and could cause rebound headaches just as much as a narcotic pain medication.  Discuss possibility of controller medicine we'll look into headache referral. Patient will investigate her insurance coverage contact us for referral as needed. -Patient advised to return or notify health care team  immediately if symptoms worsen or persist or new concerns arise. Contact us if she needs a note for work to limit her hours to  closer to 40 hours instead of 80 hours. For medical reasons.  Patient Instructions  Your blood pressure is much better   Continue med   If need to change med there is one similar but may not be as powerful.   Headaches    hydrocodone can  also cause headaches  These still seem like migraine s.  fioricet is a rescue medication  And can also cause rebound.    Consider headache referral .  Dr Clarisse Gouge or Dr  Sharene Skeans Kathryne Sharper) . Can look into this  For your referral network and then we can refer.  Will notify you  of labs when available. ROV in  3 -4 months    [name]. Ketzaly Cardella M.D.  Total visit > 50% spent counseling and coordinating care

## 2012-02-22 NOTE — Patient Instructions (Addendum)
Your blood pressure is much better   Continue med   If need to change med there is one similar but may not be as powerful.   Headaches    hydrocodone can also cause headaches  These still seem like migraine s.  fioricet is a rescue medication  And can also cause rebound.    Consider headache referral .  Dr Clarisse Gouge or Dr  Sharene Skeans Kathryne Sharper) . Can look into this  For your referral network and then we can refer.  Will notify you  of labs when available. ROV in  3 -4 months

## 2012-02-25 ENCOUNTER — Telehealth: Payer: Self-pay | Admitting: Internal Medicine

## 2012-02-25 NOTE — Telephone Encounter (Signed)
Change to of losartan 100 mg 1 po qd  rx for 90 days  And 1 refill.    And stop the benicar  If blood pressure still in control then fu rov in 3-4 months as planned .  Also tell her her lab tests were better her anemia is almost gone .

## 2012-02-25 NOTE — Telephone Encounter (Signed)
Pt can not be approved for Benicar until pt tries one of the following medications for at least 30 days Losartan(HCTZ), Irberartan (HCTZ), Valsartan HTCZ, Eprosartan or  Candesartan HCTZ

## 2012-02-26 ENCOUNTER — Encounter: Payer: Self-pay | Admitting: Internal Medicine

## 2012-02-26 ENCOUNTER — Other Ambulatory Visit: Payer: Self-pay | Admitting: Internal Medicine

## 2012-02-26 MED ORDER — LOSARTAN POTASSIUM 100 MG PO TABS: 100.0000 mg | ORAL_TABLET | Freq: Every day | ORAL | Status: DC

## 2012-02-26 NOTE — Telephone Encounter (Signed)
Pt notified by telephone.  Sent in new rx to [id number] with 1 refills. Pt informed of labs and she had viewed them on My Chart.  Benicar d/c in the system.

## 2012-02-26 NOTE — Telephone Encounter (Signed)
Left message on home phone for the pt to return my call. 

## 2012-05-16 ENCOUNTER — Other Ambulatory Visit: Payer: Self-pay | Admitting: Obstetrics & Gynecology

## 2012-05-16 DIAGNOSIS — Z1231 Encounter for screening mammogram for malignant neoplasm of breast: Secondary | ICD-10-CM

## 2012-06-13 ENCOUNTER — Ambulatory Visit (HOSPITAL_COMMUNITY)
Admission: RE | Admit: 2012-06-13 | Discharge: 2012-06-13 | Disposition: A | Discharge status: Home / Self Care | Payer: No Typology Code available for payment source | Source: Ambulatory Visit | Attending: Obstetrics & Gynecology | Admitting: Obstetrics & Gynecology

## 2012-06-13 DIAGNOSIS — Z1231 Encounter for screening mammogram for malignant neoplasm of breast: Secondary | ICD-10-CM | POA: Insufficient documentation

## 2012-06-20 ENCOUNTER — Encounter: Payer: Self-pay | Admitting: Internal Medicine

## 2012-06-20 ENCOUNTER — Ambulatory Visit (INDEPENDENT_AMBULATORY_CARE_PROVIDER_SITE_OTHER): Payer: No Typology Code available for payment source | Admitting: Internal Medicine

## 2012-06-20 VITALS — BP 134/80 | HR 87 | Temp 98.0°F | Wt 220.0 lb

## 2012-06-20 DIAGNOSIS — I10 Essential (primary) hypertension: Secondary | ICD-10-CM

## 2012-06-20 DIAGNOSIS — E785 Hyperlipidemia, unspecified: Secondary | ICD-10-CM

## 2012-06-20 DIAGNOSIS — H9319 Tinnitus, unspecified ear: Secondary | ICD-10-CM

## 2012-06-20 MED ORDER — AMLODIPINE BESYLATE 5 MG PO TABS: 5.0000 mg | ORAL_TABLET | Freq: Every day | ORAL | Status: DC

## 2012-06-20 NOTE — Patient Instructions (Addendum)
ringing in ears usually   Very common and if persists see ent for evaluation .  Goal is below 140/90    Adding either a diuretic or  Amlodipine and since diuretic didn't  Work that well.      ROV in about 6 weeks and will make another plan .

## 2012-06-20 NOTE — Progress Notes (Signed)
Chief Complaint  Patient presents with  . Hypertension    140's over 110's.  Ringing in her ears ongoing for several months.  . Tinnitus    [id number] comes in today for SDA for  problem evaluation. Called this week and was told to come in today.  Because she was getting bp readings in  140s/100s  Or so  . Concerned about this  Is working very hard   In the past  visit we because of change  Insurance    Had her change med because of need for failure. On formulary meds ! High pitched ringing in ears off an on  Needs quiet to read and concentrated  No trauma no new  Neuro sx.  ROS: See pertinent positives and negatives per HPI. Still with arthritis pains and aches  Using Tylenol for now  And no nsaid cause of hxof   Bleeding ulcers .  Doesn't know if statin could cause any sx   Past Medical History  Diagnosis Date  . HYPERLIPIDEMIA 12/29/2006    takes Lovastatin nightly  . Right bundle branch block 02/09/2007  . IBS 04/28/2007  . MENOPAUSAL DISORDER 12/07/2007  . Rheumatoid arthritis 12/29/2006  . SYNCOPE 12/07/2007  . WEIGHT GAIN 12/18/2008  . Palpitations 02/09/2007  . UNS ADVRS EFF OTH RX MEDICINAL\T\BIOLOGICAL SBSTNC 02/09/2007  . GASTRIC ULCER, ACUTE, HEMORRHAGE, HX OF 02/09/2007  . COLONIC POLYPS, HX OF 02/09/2007  . REDUCTION MAMMOPLASTY, HX OF 02/09/2007  . HYPERTENSION 12/29/2006    takes Enalapril daily  . Headache     occasionally  . CHOLECYSTECTOMY, HX OF   . Fibromyalgia     doesn't require meds  . Joint pain   . Joint swelling   . Chronic back pain   . Bruises easily   . H/O hiatal hernia   . GERD (gastroesophageal reflux disease)     takes Nexium daily  . UTI (lower urinary tract infection)     hx of;saw urologist last yr and he told her she was over reacting and not a big deal   . Anemia   . Hx of seasonal allergies     takes Claritin daily  . Insomnia     related to pain;takes Flexeril and Tylenol PM nightly  . S/P lumbar fusion 6 13 09/03/2011    L4 L5   posterior   . Cough, persistent 01/21/2012    poss from acei     Family History  Problem Relation Age of Onset  . Diabetes Mother   . Colon cancer Mother   . Arthritis Mother   . Hypertension Mother   . Stroke Father   . Heart disease Father   . Arthritis Brother   . Hypertension Brother   . Hyperlipidemia Brother   . Anesthesia problems Neg Hx   . Hypotension Neg Hx   . Malignant hyperthermia Neg Hx   . Pseudochol deficiency Neg Hx   . Other      DISH brother    History   Social History  . Marital Status: Widowed    Spouse Name: N/A    Number of Children: N/A  . Years of Education: N/A   Social History Main Topics  . Smoking status: Never Smoker   . Smokeless tobacco: None  . Alcohol Use: Yes     Comment: Once a year for her birthday  . Drug Use: No  . Sexually Active: No   Other Topics Concern  . None   Social History Narrative  Works Metallurgist and organizing non profits   Widow  Husband died suddenly Jul 14, 2008   Non smoker    Outpatient Encounter Prescriptions as of 06/20/2012  Medication Sig Dispense Refill  . acetaminophen (TYLENOL) 650 MG CR tablet Take 1,300 mg by mouth daily.       . clobetasol cream (TEMOVATE) 0.05 % Apply 1 application topically 2 (two) times daily.      . cyclobenzaprine (FLEXERIL) 5 MG tablet Take 2.5 mg by mouth at bedtime as needed.        . Diphenhydramine-APAP, sleep, (TYLENOL PM EXTRA STRENGTH PO) Take 1 tablet by mouth at bedtime as needed. For sleep      . esomeprazole (NEXIUM) 40 MG capsule Take 40 mg by mouth every morning before breakfast.        . estradiol (VIVELLE-DOT) 0.0375 MG/24HR Place 1 patch onto the skin twice a week.        . losartan (COZAAR) 100 MG tablet Take 1 tablet (100 mg total) by mouth daily.  90 tablet  1  . lovastatin (MEVACOR) 20 MG tablet Take 1 tablet (20 mg total) by mouth at bedtime.  90 tablet  3  . Multiple Vitamin (MULTIVITAMIN) tablet Take 1 tablet by mouth daily.        Marland Kitchen amLODipine  (NORVASC) 5 MG tablet Take 1 tablet (5 mg total) by mouth daily.  30 tablet  3   No facility-administered encounter medications on file as of 06/20/2012.    EXAM:  BP 134/80  Pulse 87  Temp(Src) 98 F (36.7 C) (Oral)  Wt 220 lb (99.791 kg)  BMI 37.74 kg/m2  SpO2 96%  Body mass index is 37.74 kg/(m^2).  GENERAL: vitals reviewed and listed above, alert, oriented, appears well hydrated and in no acute distress  HEENT: atraumatic, conjunctiva  clear, no obvious abnormalities on inspection of external nose and ears tms intact little wax  OP : no lesion edema or exudate   NECK: no obvious masses on inspection palpation  No adenopathy don't hear bruit   LUNGS: clear to auscultation bilaterally, no wheezes, rales or rhonchi, good air movement  CV: HRRR, no clubbing cyanosis or  peripheral edema nl cap refill  bp 136/82 NS 134/80 BOTH ARMS MS: moves all extremities without noticeable focal  Abnormality  djd changes   Neuro non focal   Gait steady  Oriented x 3 and no noted deficits in memory, attention, and speech.  PSYCH: pleasant and cooperative, no obvious depression or anxiety  ASSESSMENT AND PLAN:  Discussed the following assessment and plan:  HYPERTENSION - not bad in office but if getting  elevaetd most of time can adjust med   HYPERLIPIDEMIA  Tinnitus, unspecified laterality Non alarming exam today  Will follow   Problem began after having to change meds for formulary  Arthritis   Consider trying off statin  med to see if changes pain issues  -Patient advised to return or notify health care team  if symptoms worsen or persist or new concerns arise.  Patient Instructions  ringing in ears usually   Very common and if persists see ent for evaluation .  Goal is below 140/90    Adding either a diuretic or  Amlodipine and since diuretic didn't  Work that well.      ROV in about 6 weeks and will make another plan .    [name]. Kip Cropp M.D.

## 2012-06-23 ENCOUNTER — Encounter: Payer: Self-pay | Admitting: Internal Medicine

## 2012-06-23 DIAGNOSIS — H9319 Tinnitus, unspecified ear: Secondary | ICD-10-CM | POA: Insufficient documentation

## 2012-06-29 ENCOUNTER — Telehealth: Payer: Self-pay | Admitting: Internal Medicine

## 2012-06-29 ENCOUNTER — Ambulatory Visit (INDEPENDENT_AMBULATORY_CARE_PROVIDER_SITE_OTHER): Payer: No Typology Code available for payment source | Admitting: Internal Medicine

## 2012-06-29 ENCOUNTER — Encounter: Payer: Self-pay | Admitting: Internal Medicine

## 2012-06-29 VITALS — BP 122/84 | HR 80 | Temp 97.8°F | Wt 219.0 lb

## 2012-06-29 DIAGNOSIS — J309 Allergic rhinitis, unspecified: Secondary | ICD-10-CM

## 2012-06-29 DIAGNOSIS — B9689 Other specified bacterial agents as the cause of diseases classified elsewhere: Secondary | ICD-10-CM

## 2012-06-29 DIAGNOSIS — I10 Essential (primary) hypertension: Secondary | ICD-10-CM

## 2012-06-29 DIAGNOSIS — J209 Acute bronchitis, unspecified: Secondary | ICD-10-CM

## 2012-06-29 DIAGNOSIS — A499 Bacterial infection, unspecified: Secondary | ICD-10-CM

## 2012-06-29 MED ORDER — DOXYCYCLINE HYCLATE 100 MG PO CAPS: 100.0000 mg | ORAL_CAPSULE | Freq: Two times a day (BID) | ORAL | Status: DC

## 2012-06-29 MED ORDER — HYDROCODONE-HOMATROPINE 5-1.5 MG/5ML PO SYRP: 5.0000 mL | ORAL_SOLUTION | ORAL | Status: DC | PRN

## 2012-06-29 NOTE — Telephone Encounter (Signed)
Noted.  Appt today @ 2:15

## 2012-06-29 NOTE — Telephone Encounter (Signed)
Patient Information:  °Caller Name: Jade Cardenas  °Phone: [phone]  °Patient: Jade Cardenas  °Gender: Female  °DOB: 06/12/1956  °Age: 62 Years  °PCP: Jenkins, John (Adults only)  °Pregnant: No  °Office Follow Up:  °Does the office need to follow up with this patient?: Yes  °Instructions For The Office: Patient needs appt for the office . Triage states "Office now". schedule is booked until 2p. Please contact patient now for appt.  °Symptoms  °Reason For Call & Symptoms: Patient complains of onset UTI symptoms since Monday, 06/27/12. +pressure, lower back discomfort , blood in urine, body aches , nausea, +urinary burning and frequency. Last UOP - 10:00.  °Reviewed Health History In EMR: Yes  °Reviewed Medications In EMR: Yes  °Reviewed Allergies In EMR: Yes  °Reviewed Surgeries / Procedures: Yes  °Date of Onset of Symptoms: 06/27/2012  °Treatments Tried: Azo started 06/28/12  °Treatments Tried Worked: Yes  °OB / GYN:  °LMP: Unknown  °Guideline(s) Used:  °Urination Pain - Female  °Disposition Per Guideline:  °Go to Office Now  °Reason For Disposition Reached:  °Side (flank) or lower back pain present  °Advice Given:  °Fluids:  °Drink extra fluids. Drink 8-10 glasses of liquids a day (Reason: to produce a dilute, non-irritating urine).  °Call Back If:  °You become worse.  °Warm Saline SITZ Baths to Reduce Pain:  °Sit in a warm saline bath for 20 minutes to cleanse the area and to reduce pain. Add 2 oz. of table salt or baking soda to a tub of water.  °Patient Will Follow Care Advice:  °YES ° °

## 2012-06-29 NOTE — Telephone Encounter (Signed)
Patient Information:  Caller Name: [name]  Phone: [phone]  Patient: Jade Cardenas, Jade Cardenas  Gender: Female  DOB: 07/22/1950  Age: 62 Years  PCP: Berniece Andreas [facility])  Office Follow Up:  Does the office need to follow up with this patient?: No  Instructions For The Office: N/A   Symptoms  Reason For Call & Symptoms: Patient was seen in office on 06/20/12 for blood pressure check and evaluation.  She was started on Norvasc 5mg .  She states she developed a cough over Easter weekend BEFORE STARTING THE NORVASC.  She reports ongoing worsening. Productive clear with yellow and blood tinged.  Spasmodic coughing and tired. She feels as if she has Bronchitis  Reviewed Health History In EMR: Yes  Reviewed Medications In EMR: Yes  Reviewed Allergies In EMR: Yes  Reviewed Surgeries / Procedures: Yes  Date of Onset of Symptoms: 06/19/2012  Treatments Tried: Delsym completed a bottle already and has now bought DM  Treatments Tried Worked: Yes  Guideline(s) Used:  Cough  Disposition Per Guideline:   See Today in Office  Reason For Disposition Reached:   Coughing up rusty-colored (reddish-brown) or blood-tinged sputum  Advice Given:  Reassurance  Coughing is the way that our lungs remove irritants and mucus. It helps protect our lungs from getting pneumonia.  You can get a dry hacking cough after a chest cold. Sometimes this type of cough can last 1-3 weeks, and be worse at night.  You can also get a cough after being exposed to irritating substances like smoke, strong perfumes, and dust.  Here is some care advice that should help.  Cough Medicines:  OTC Cough Drops: Cough drops can help a lot, especially for mild coughs. They reduce coughing by soothing your irritated throat and removing that tickle sensation in the back of the throat. Cough drops also have the advantage of portability - you can carry them with you.  Home Remedy - Hard Candy: Hard candy works just as well as  medicine-flavored OTC cough drops. Diabetics should use sugar-free candy.  Home Remedy - Honey: This old home remedy has been shown to help decrease coughing at night. The adult dosage is 2 teaspoons (10 ml) at bedtime. Honey should not be given to infants under one year of age.  Coughing Spasms:  Drink warm fluids. Inhale warm mist (Reason: both relax the airway and loosen up the phlegm).  Suck on cough drops or hard candy to coat the irritated throat.  Prevent Dehydration:  Drink adequate liquids.  This will help soothe an irritated or dry throat and loosen up the phlegm.  Call Back If:  Difficulty breathing  You become worse.  Patient Will Follow Care Advice:  YES  Appointment Scheduled:  06/29/2012 14:00:00 Appointment Scheduled Provider:  Berniece Andreas [facility])

## 2012-06-29 NOTE — Patient Instructions (Signed)
Because of the coughing blood would treat for bacterial bronchitis  Although you certainly could have allergy causing this also .  Take antibiotic   With plenty of fluid.    Expect  Improvement in the next 3-5 days.   Cough med as  Comfort.   If  persistent or progressive contact us and consider getting chest x ray.

## 2012-06-29 NOTE — Progress Notes (Signed)
Chief Complaint  Patient presents with  . Cough    Cough is productive of a thick mucus.  Most of the time is clear but at times can be yellow.  Had some blood mixed in this morning.    HPI: Patient comes in today for acute visit. Coughing last week and thought it was    Allergy   when she was here in the office however Got some better then got worse and hard to catch breath  Coughing   This am coughing up blood  streaks Mixed in mucous some  Tastes in throat. No fever and chills no specific shortness of breath otherwise. Face is nontender over she is tired and achy. Called about this  And coming in.  ROS: See pertinent positives and negatives per HPI. No bruising or bleeding did bring her blood pressure cuff in today to check. They're running a little higher than ours.  Past Medical History  Diagnosis Date  . HYPERLIPIDEMIA 12/29/2006    takes Lovastatin nightly  . Right bundle branch block 02/09/2007  . IBS 04/28/2007  . MENOPAUSAL DISORDER 12/07/2007  . Rheumatoid arthritis 12/29/2006  . SYNCOPE 12/07/2007  . WEIGHT GAIN 12/18/2008  . Palpitations 02/09/2007  . UNS ADVRS EFF OTH RX MEDICINAL\T\BIOLOGICAL SBSTNC 02/09/2007  . GASTRIC ULCER, ACUTE, HEMORRHAGE, HX OF 02/09/2007  . COLONIC POLYPS, HX OF 02/09/2007  . REDUCTION MAMMOPLASTY, HX OF 02/09/2007  . HYPERTENSION 12/29/2006    takes Enalapril daily  . Headache     occasionally  . CHOLECYSTECTOMY, HX OF   . Fibromyalgia     doesn't require meds  . Joint pain   . Joint swelling   . Chronic back pain   . Bruises easily   . H/O hiatal hernia   . GERD (gastroesophageal reflux disease)     takes Nexium daily  . UTI (lower urinary tract infection)     hx of;saw urologist last yr and he told her she was over reacting and not a big deal   . Anemia   . Hx of seasonal allergies     takes Claritin daily  . Insomnia     related to pain;takes Flexeril and Tylenol PM nightly  . S/P lumbar fusion 6 13 09/03/2011    L4 L5   posterior   . Cough, persistent 01/21/2012    poss from acei    Past Surgical History  Procedure Laterality Date  . Breast reduction surgery      last 80's  . Cervical fusion      2 12   . Tonsillectomy      as a child  . Abdominal hysterectomy  1986  . Cholecystectomy  1996  . Back surgery  12+yrs ago    Synovial Cyst removal  . Bone spur  6-37yrs ago    right ankle  . Colonoscopy      Family History  Problem Relation Age of Onset  . Diabetes Mother   . Colon cancer Mother   . Arthritis Mother   . Hypertension Mother   . Stroke Father   . Heart disease Father   . Arthritis Brother   . Hypertension Brother   . Hyperlipidemia Brother   . Anesthesia problems Neg Hx   . Hypotension Neg Hx   . Malignant hyperthermia Neg Hx   . Pseudochol deficiency Neg Hx   . Other      DISH brother    History   Social History  . Marital Status: Widowed  Spouse Name: N/A    Number of Children: N/A  . Years of Education: N/A   Social History Main Topics  . Smoking status: Never Smoker   . Smokeless tobacco: None  . Alcohol Use: Yes     Comment: Once a year for her birthday  . Drug Use: No  . Sexually Active: No   Other Topics Concern  . None   Social History Narrative   Works fund raising and organizing non profits   Widow  Husband died suddenly 2008-07-30   Non smoker    Outpatient Encounter Prescriptions as of 06/29/2012  Medication Sig Dispense Refill  . acetaminophen (TYLENOL) 650 MG CR tablet Take 1,300 mg by mouth daily.       Marland Kitchen amLODipine (NORVASC) 5 MG tablet Take 1 tablet (5 mg total) by mouth daily.  30 tablet  3  . clobetasol cream (TEMOVATE) 0.05 % Apply 1 application topically 2 (two) times daily.      . cyclobenzaprine (FLEXERIL) 5 MG tablet Take 2.5 mg by mouth at bedtime as needed.        . Diphenhydramine-APAP, sleep, (TYLENOL PM EXTRA STRENGTH PO) Take 1 tablet by mouth at bedtime as needed. For sleep      . esomeprazole (NEXIUM) 40 MG capsule Take 40 mg  by mouth every morning before breakfast.        . estradiol (VIVELLE-DOT) 0.0375 MG/24HR Place 1 patch onto the skin twice a week.        . losartan (COZAAR) 100 MG tablet Take 1 tablet (100 mg total) by mouth daily.  90 tablet  1  . lovastatin (MEVACOR) 20 MG tablet Take 1 tablet (20 mg total) by mouth at bedtime.  90 tablet  3  . Multiple Vitamin (MULTIVITAMIN) tablet Take 1 tablet by mouth daily.        Marland Kitchen doxycycline (VIBRAMYCIN) 100 MG capsule Take 1 capsule (100 mg total) by mouth 2 (two) times daily.  20 capsule  0  . HYDROcodone-homatropine (HYCODAN) 5-1.5 MG/5ML syrup Take 5 mLs by mouth every 4 (four) hours as needed for cough.  180 mL  0   No facility-administered encounter medications on file as of 06/29/2012.    EXAM:  BP 122/84  Pulse 80  Temp(Src) 97.8 F (36.6 C) (Oral)  Wt 219 lb (99.338 kg)  BMI 37.57 kg/m2  SpO2 98%  Body mass index is 37.57 kg/(m^2). Blood pressure reading her machine left arm 145/92 my reading regular cuff size 140/88 arm regular cuff sitting 138/80 WDWN in NAD  quiet respirations; mildly congested  somewhat hoarse. Non toxic . HEENT: Normocephalic ;atraumatic , Eyes;  PERRL, EOMs  Full, lids and conjunctiva clear,,Ears: no deformities, canals nl, TM landmarks normal, Nose: no deformity or discharge but congested;face minimally tender Mouth : OP clear without lesion or edema . Neck: Supple without adenopathy or masses or bruits mildy tender anterior .  Chest:  Clear to A&P without wheezes rales or rhonchi CV:  S1-S2 no gallops or murmurs peripheral perfusion is normal Skin :nl perfusion and no acute rashes  PSYCH: pleasant and cooperative, no obvious depression or anxiety  ASSESSMENT AND PLAN:  Discussed the following assessment and plan:  Acute bacterial bronchitis - prob with hx of blood although could have been from coughing hard  cover for atypicals has uncderlying allergy  HYPERTENSION - bp monitor correlated enough;keep fu  appt.  ALLERGIC RHINITIS  -Patient advised to return or notify health care team  if symptoms worsen  or persist or new concerns arise.  Patient Instructions  Because of the coughing blood would treat for bacterial bronchitis  Although you certainly could have allergy causing this also .  Take antibiotic   With plenty of fluid.    Expect  Improvement in the next 3-5 days.   Cough med as  Comfort.   If  persistent or progressive contact us and consider getting chest x ray.      [name]. Panosh M.D.

## 2012-06-29 NOTE — Telephone Encounter (Signed)
error 

## 2012-07-28 ENCOUNTER — Encounter: Payer: Self-pay | Admitting: Internal Medicine

## 2012-07-28 ENCOUNTER — Ambulatory Visit (INDEPENDENT_AMBULATORY_CARE_PROVIDER_SITE_OTHER): Payer: No Typology Code available for payment source | Admitting: Internal Medicine

## 2012-07-28 VITALS — BP 117/70 | HR 94 | Temp 97.5°F | Wt 217.0 lb

## 2012-07-28 DIAGNOSIS — E785 Hyperlipidemia, unspecified: Secondary | ICD-10-CM

## 2012-07-28 DIAGNOSIS — H9319 Tinnitus, unspecified ear: Secondary | ICD-10-CM

## 2012-07-28 DIAGNOSIS — I10 Essential (primary) hypertension: Secondary | ICD-10-CM

## 2012-07-28 MED ORDER — LOVASTATIN 20 MG PO TABS: 20.0000 mg | ORAL_TABLET | Freq: Every day | ORAL | Status: DC

## 2012-07-28 MED ORDER — CLOBETASOL PROPIONATE 0.05 % EX CREA: 1.0000 "application " | TOPICAL_CREAM | Freq: Two times a day (BID) | CUTANEOUS | Status: DC

## 2012-07-28 MED ORDER — AMLODIPINE BESYLATE 5 MG PO TABS: 5.0000 mg | ORAL_TABLET | Freq: Every day | ORAL | Status: DC

## 2012-07-28 MED ORDER — LOSARTAN POTASSIUM 100 MG PO TABS: 100.0000 mg | ORAL_TABLET | Freq: Every day | ORAL | Status: DC

## 2012-07-28 NOTE — Progress Notes (Signed)
Chief Complaint  Patient presents with  . Follow-up    HPI: FU  bp  And other issues  Decided to  Retire  June 30  Just went to beach and doing olk.  Readings  Lower  Not  Dizzy when splits it upto am and pm doses  bp readings mostly syst 130 120 some 80 - 90 diastolics   Got stomach virus .  Recently  Then had dry heaves and diarrhea and weak feeling.   Hard to get out of bed. gettig better still some loose stools on gatorade   Dr truslows did cbc and tsh. And was normal recently  Tinnitus some better still comes and goes.  ROS: See pertinent positives and negatives per HPI.  Past Medical History  Diagnosis Date  . HYPERLIPIDEMIA 12/29/2006    takes Lovastatin nightly  . Right bundle branch block 02/09/2007  . IBS 04/28/2007  . MENOPAUSAL DISORDER 12/07/2007  . Rheumatoid arthritis(714.0) 12/29/2006  . SYNCOPE 12/07/2007  . WEIGHT GAIN 12/18/2008  . Palpitations 02/09/2007  . UNS ADVRS EFF OTH RX MEDICINAL\T\BIOLOGICAL SBSTNC 02/09/2007  . GASTRIC ULCER, ACUTE, HEMORRHAGE, HX OF 02/09/2007  . COLONIC POLYPS, HX OF 02/09/2007  . REDUCTION MAMMOPLASTY, HX OF 02/09/2007  . HYPERTENSION 12/29/2006    takes Enalapril daily  . Headache(784.0)     occasionally  . CHOLECYSTECTOMY, HX OF   . Fibromyalgia     doesn't require meds  . Joint pain   . Joint swelling   . Chronic back pain   . Bruises easily   . H/O hiatal hernia   . GERD (gastroesophageal reflux disease)     takes Nexium daily  . UTI (lower urinary tract infection)     hx of;saw urologist last yr and he told her she was over reacting and not a big deal   . Anemia   . Hx of seasonal allergies     takes Claritin daily  . Insomnia     related to pain;takes Flexeril and Tylenol PM nightly  . S/P lumbar fusion 6 13 09/03/2011    L4 L5  posterior   . Cough, persistent 01/21/2012    poss from acei     Family History  Problem Relation Age of Onset  . Diabetes Mother   . Colon cancer Mother   . Arthritis Mother    . Hypertension Mother   . Stroke Father   . Heart disease Father   . Arthritis Brother   . Hypertension Brother   . Hyperlipidemia Brother   . Anesthesia problems Neg Hx   . Hypotension Neg Hx   . Malignant hyperthermia Neg Hx   . Pseudochol deficiency Neg Hx   . Other      DISH brother    History   Social History  . Marital Status: Widowed    Spouse Name: N/A    Number of Children: N/A  . Years of Education: N/A   Social History Main Topics  . Smoking status: Never Smoker   . Smokeless tobacco: None  . Alcohol Use: Yes     Comment: Once a year for her birthday  . Drug Use: No  . Sexually Active: No   Other Topics Concern  . None   Social History Narrative   Works fund raising and organizing non profits   Widow  Husband died suddenly 08-09-08   Non smoker    Outpatient Encounter Prescriptions as of 07/28/2012  Medication Sig Dispense Refill  . acetaminophen (TYLENOL) 650  MG CR tablet Take 1,300 mg by mouth daily.       Marland Kitchen amLODipine (NORVASC) 5 MG tablet Take 1 tablet (5 mg total) by mouth daily.  90 tablet  1  . clobetasol cream (TEMOVATE) 0.05 % Apply 1 application topically 2 (two) times daily.  90 g  1  . cyclobenzaprine (FLEXERIL) 5 MG tablet Take 2.5 mg by mouth at bedtime as needed.        . Diphenhydramine-APAP, sleep, (TYLENOL PM EXTRA STRENGTH PO) Take 1 tablet by mouth at bedtime as needed. For sleep      . esomeprazole (NEXIUM) 40 MG capsule Take 40 mg by mouth every morning before breakfast.        . estradiol (VIVELLE-DOT) 0.0375 MG/24HR Place 1 patch onto the skin twice a week.        . losartan (COZAAR) 100 MG tablet Take 1 tablet (100 mg total) by mouth daily.  90 tablet  1  . lovastatin (MEVACOR) 20 MG tablet Take 1 tablet (20 mg total) by mouth at bedtime.  90 tablet  1  . Multiple Vitamin (MULTIVITAMIN) tablet Take 1 tablet by mouth daily.        . [DISCONTINUED] amLODipine (NORVASC) 5 MG tablet Take 1 tablet (5 mg total) by mouth daily.  30 tablet   3  . [DISCONTINUED] clobetasol cream (TEMOVATE) 0.05 % Apply 1 application topically 2 (two) times daily.      . [DISCONTINUED] losartan (COZAAR) 100 MG tablet Take 1 tablet (100 mg total) by mouth daily.  90 tablet  1  . [DISCONTINUED] lovastatin (MEVACOR) 20 MG tablet Take 1 tablet (20 mg total) by mouth at bedtime.  90 tablet  3  . [DISCONTINUED] lovastatin (MEVACOR) 20 MG tablet Take 1 tablet (20 mg total) by mouth at bedtime.  90 tablet  1  . [DISCONTINUED] doxycycline (VIBRAMYCIN) 100 MG capsule Take 1 capsule (100 mg total) by mouth 2 (two) times daily.  20 capsule  0  . [DISCONTINUED] HYDROcodone-homatropine (HYCODAN) 5-1.5 MG/5ML syrup Take 5 mLs by mouth every 4 (four) hours as needed for cough.  180 mL  0   No facility-administered encounter medications on file as of 07/28/2012.    EXAM:  BP 117/70  Pulse 94  Temp(Src) 97.5 F (36.4 C) (Oral)  Wt 217 lb (98.431 kg)  BMI 37.23 kg/m2  SpO2 98%  Body mass index is 37.23 kg/(m^2).  GENERAL: vitals reviewed and listed above, alert, oriented, appears well hydrated and in no acute distress  LUNGS: clear to auscultation bilaterally, no wheezes, rales or rhonchi, good air movement  CV: HRRR, no clubbing cyanosis or  peripheral edema nl cap refill   MS: moves all extremities   PSYCH: pleasant and cooperative, no obvious depression or anxiety  ASSESSMENT AND PLAN:  Discussed the following assessment and plan:  HYPERTENSION - overall much better  stay on same dose  Tinnitus, unspecified laterality  HYPERLIPIDEMIA - refill  labs in about 4 months wellness  -Patient advised to return or notify health care team  if symptoms worsen or persist or new concerns arise.  Patient Instructions  Blood pressure is much better. cpx and labs  In about 4 months .  Get dr Kellie Simmering   To send [place] notes and labs. Consider seeing  Audiologist ,    Or ent if ringing getting worse.    [name]. Dakarai Mcglocklin M.D.

## 2012-07-28 NOTE — Patient Instructions (Addendum)
Blood pressure is much better. cpx and labs  In about 4 months .  Get dr Kellie Simmering   To send [place] notes and labs. Consider seeing  Audiologist ,    Or ent if ringing getting worse.

## 2012-09-15 ENCOUNTER — Encounter: Payer: Self-pay | Admitting: Obstetrics & Gynecology

## 2012-09-20 ENCOUNTER — Encounter: Payer: Self-pay | Admitting: Obstetrics & Gynecology

## 2012-09-20 ENCOUNTER — Ambulatory Visit (INDEPENDENT_AMBULATORY_CARE_PROVIDER_SITE_OTHER): Payer: BC Managed Care – PPO | Admitting: Obstetrics & Gynecology

## 2012-09-20 VITALS — BP 132/72 | HR 68 | Resp 16 | Ht 64.0 in | Wt 223.4 lb

## 2012-09-20 DIAGNOSIS — Z01419 Encounter for gynecological examination (general) (routine) without abnormal findings: Secondary | ICD-10-CM

## 2012-09-20 DIAGNOSIS — Z124 Encounter for screening for malignant neoplasm of cervix: Secondary | ICD-10-CM

## 2012-09-20 MED ORDER — ESTRADIOL 0.025 MG/24HR TD PTTW: 1.0000 | MEDICATED_PATCH | TRANSDERMAL | Status: DC

## 2012-09-20 NOTE — Progress Notes (Addendum)
62 y.o. G0P0000 WidowedCaucasianF here for annual exam.  No vaginal bleeding.  Doing really well.  She has decided to retire from [employer].  She is currently working 10 hours a week.  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    Had anemia last year when she was here.  She did see Dr. Kinnie Scales.  Had bleeding ulcer diagnosed with endoscopy.  Colonoscopy was also done at the same time.  Hb 12.5 in April.    Patient's last menstrual period was 03/02/1984.          Sexually active: no  The current method of family planning is status post hysterectomy.    Exercising: yes  walking Smoker:  no  Health Maintenance: Pap:  08/21/11 WNL History of abnormal Pap:  no MMG:  06/13/12 normal Colonoscopy:  8/13 repeat in 5 years.  Dr. Kinnie Scales. Tresa Endo Nix confirmed with his office.  Requested records be faxed 11/18/12) BMD:   11/11 0.7/-1.2 TDaP:  05/17/08 Screening Labs: PCP, Hb today: PCP, Urine today: PCP   reports that she has never smoked. She has never used smokeless tobacco. She reports that she does not drink alcohol or use illicit drugs.  Past Medical History  Diagnosis Date  . HYPERLIPIDEMIA 12/29/2006    takes Lovastatin nightly  . Right bundle branch block 02/09/2007  . IBS 04/28/2007  . MENOPAUSAL DISORDER 12/07/2007  . Rheumatoid arthritis(714.0) 12/29/2006  . SYNCOPE 12/07/2007  . WEIGHT GAIN 12/18/2008  . Palpitations 02/09/2007  . UNS ADVRS EFF OTH RX MEDICINAL\T\BIOLOGICAL SBSTNC 02/09/2007  . GASTRIC ULCER, ACUTE, HEMORRHAGE, HX OF 02/09/2007  . COLONIC POLYPS, HX OF 02/09/2007  . REDUCTION MAMMOPLASTY, HX OF 02/09/2007  . HYPERTENSION 12/29/2006    takes Enalapril daily  . Headache(784.0)     occasionally  . CHOLECYSTECTOMY, HX OF   . Fibromyalgia     doesn't require meds  . Joint pain   . Joint swelling   . Chronic back pain   . Bruises easily   . H/O hiatal hernia   . GERD (gastroesophageal reflux  disease)     takes Nexium daily  . UTI (lower urinary tract infection)     hx of;saw urologist last yr and he told her she was over reacting and not a big deal   . Anemia   . Hx of seasonal allergies     takes Claritin daily  . Insomnia     related to pain;takes Flexeril and Tylenol PM nightly  . S/P lumbar fusion 6 13 09/03/2011    L4 L5  posterior   . Cough, persistent 01/21/2012    poss from acei   . 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 Abnormal Pap smear     H/O  . Goiter     Past Surgical History  Procedure Laterality Date  . Breast reduction surgery      last 80's  . Cervical fusion      2 12   . Tonsillectomy      as a child  . Abdominal hysterectomy  1986    TAH/BSO  . Cholecystectomy  1996  . Back surgery  12+yrs ago    Synovial Cyst removal  . Bone spur  6-62yrs ago    right ankle  . Colonoscopy    . Back surgery  6/13    lumbar L4-L5 fusion, lamenectomy  . Blood transfused  6/13    transfused 2  units post op from back surgery    Current Outpatient Prescriptions  Medication Sig Dispense Refill  . acetaminophen (TYLENOL) 650 MG CR tablet Take 1,300 mg by mouth daily.       Marland Kitchen amLODipine (NORVASC) 5 MG tablet Take 1 tablet (5 mg total) by mouth daily.  90 tablet  1  . clobetasol cream (TEMOVATE) 0.05 % Apply 1 application topically 2 (two) times daily.  90 g  1  . cyclobenzaprine (FLEXERIL) 5 MG tablet Take 2.5 mg by mouth at bedtime as needed.        . Diphenhydramine-APAP, sleep, (TYLENOL PM EXTRA STRENGTH PO) Take 1 tablet by mouth at bedtime as needed. For sleep      . esomeprazole (NEXIUM) 40 MG capsule Take 40 mg by mouth every morning before breakfast.        . estradiol (VIVELLE-DOT) 0.025 MG/24HR Place 1 patch onto the skin 2 (two) times a week.      . loratadine (CLARITIN) 10 MG tablet Take 10 mg by mouth as needed for allergies.      Marland Kitchen losartan (COZAAR) 100 MG tablet Take 1 tablet (100 mg total) by mouth daily.  90 tablet  1  . lovastatin (MEVACOR) 20  MG tablet Take 1 tablet (20 mg total) by mouth at bedtime.  90 tablet  1  . Multiple Vitamin (MULTIVITAMIN) tablet Take 1 tablet by mouth daily.        . predniSONE (DELTASONE) 5 MG tablet Take 5 mg by mouth as needed.       No current facility-administered medications for this visit.    Family History  Problem Relation Age of Onset  . Diabetes Mother   . Colon cancer Mother     x2  . Arthritis Mother   . Hypertension Mother   . Stroke Father   . Heart disease Father   . Arthritis Brother   . Hypertension Brother   . Hyperlipidemia Brother   . Anesthesia problems Neg Hx   . Hypotension Neg Hx   . Malignant hyperthermia Neg Hx   . Pseudochol deficiency Neg Hx   . Other      DISH brother  . Endometrial cancer Mother   . Colon cancer Father   . Hypertension Maternal Grandmother   . Stroke Maternal Grandmother   . Stroke Father   . Infertility Brother     ROS:  Pertinent items are noted in HPI.  Otherwise, a comprehensive ROS was negative.  Exam:   BP 132/72  Pulse 68  Resp 16  Ht 5\' 4"  (1.626 m)  Wt 223 lb 6.4 oz (101.334 kg)  BMI 38.33 kg/m2  LMP 03/02/1984  Weight change: +21 lb  Height: 5\' 4"  (162.6 cm)  Ht Readings from Last 3 Encounters:  09/20/12 5\' 4"  (1.626 m)  07/30/11 5\' 4"  (1.626 m)  05/07/10 5' 4.25" (1.632 m)    General appearance: alert, cooperative and appears stated age Head: Normocephalic, without obvious abnormality, atraumatic Neck: no adenopathy, supple, symmetrical, trachea midline and thyroid normal to inspection and palpation Lungs: clear to auscultation bilaterally Breasts: normal appearance, no masses or tenderness Heart: regular rate and rhythm Abdomen: soft, non-tender; bowel sounds normal; no masses,  no organomegaly Extremities: extremities normal, atraumatic, no cyanosis or edema Skin: Skin color, texture, turgor normal. No rashes or lesions Lymph nodes: Cervical, supraclavicular, and axillary nodes normal. No abnormal inguinal  nodes palpated Neurologic: Grossly normal   Pelvic: External genitalia:  no lesions  Urethra:  normal appearing urethra with no masses, tenderness or lesions              Bartholins and Skenes: normal                 Vagina: normal appearing vagina with normal color and discharge, no lesions              Cervix:  absent              Pap taken: yes Bimanual Exam:  Uterus:  absent              Adnexa: no masses/fullness               Rectovaginal: Confirms               Anus:  normal sphincter tone, no lesions  A:  Well Woman with normal exam S/p TAH/BSO H/O DES exposure Family hx of colon cancer Anemia, resolved  P:   Mammogram yearly pap smear today.  Done yearly due to DES exposure On Vivelle 0.025mg  patches.  Rx to pharmacy.  return annually or prn  An After Visit Summary was printed and given to the patient.

## 2012-09-23 LAB — IPS PAP SMEAR ONLY

## 2012-09-26 ENCOUNTER — Telehealth: Payer: Self-pay

## 2012-09-26 ENCOUNTER — Other Ambulatory Visit: Payer: Self-pay | Admitting: Family Medicine

## 2012-09-26 MED ORDER — LOVASTATIN 20 MG PO TABS: 20.0000 mg | ORAL_TABLET | Freq: Every day | ORAL | Status: DC

## 2012-09-26 MED ORDER — LOSARTAN POTASSIUM 100 MG PO TABS: 100.0000 mg | ORAL_TABLET | Freq: Every day | ORAL | Status: DC

## 2012-09-26 MED ORDER — FLUCONAZOLE 150 MG PO TABS: ORAL_TABLET | ORAL | Status: DC

## 2012-09-26 NOTE — Telephone Encounter (Signed)
Message copied by Elisha Headland on Mon Sep 26, 2012  9:42 AM ------      Message from: Jerene Bears      Created: Fri Sep 23, 2012  5:19 PM       Inform neg.  Pap had yeast on it.  If she is having any itching.  Ok to treat with Diflucan 150mg  po x 1, repeat 48 hrs if symptoms not resolved. ------

## 2012-09-26 NOTE — Telephone Encounter (Signed)
7/28 lmtcb/kn 

## 2012-09-26 NOTE — Telephone Encounter (Signed)
Message copied by Elisha Headland on Mon Sep 26, 2012  9:32 AM ------      Message from: Jerene Bears      Created: Fri Sep 23, 2012  5:19 PM       Inform neg.  Pap had yeast on it.  If she is having any itching.  Ok to treat with Diflucan 150mg  po x 1, repeat 48 hrs if symptoms not resolved. ------

## 2012-11-03 ENCOUNTER — Telehealth: Payer: Self-pay | Admitting: Internal Medicine

## 2012-11-03 MED ORDER — AMLODIPINE BESYLATE 5 MG PO TABS: 5.0000 mg | ORAL_TABLET | Freq: Every day | ORAL | Status: DC

## 2012-11-03 NOTE — Telephone Encounter (Signed)
PT would like to request a 3 month supply of her amLODipine (NORVASC) 5 MG tablet, be sent to [facility] on [address]. Please assist.

## 2012-11-03 NOTE — Telephone Encounter (Signed)
Sent by e[facility]scribe. 

## 2012-11-04 ENCOUNTER — Other Ambulatory Visit: Payer: Self-pay | Admitting: Family Medicine

## 2012-12-02 ENCOUNTER — Other Ambulatory Visit (INDEPENDENT_AMBULATORY_CARE_PROVIDER_SITE_OTHER): Payer: BC Managed Care – PPO

## 2012-12-02 DIAGNOSIS — Z0000 Encounter for general adult medical examination without abnormal findings: Secondary | ICD-10-CM

## 2012-12-02 LAB — HEPATIC FUNCTION PANEL
ALT: 18 U/L (ref 0–35)
AST: 20 U/L (ref 0–37)
Albumin: 4 g/dL (ref 3.5–5.2)
Alkaline Phosphatase: 80 U/L (ref 39–117)
Bilirubin, Direct: 0 mg/dL (ref 0.0–0.3)
Total Bilirubin: 0.6 mg/dL (ref 0.3–1.2)
Total Protein: 7.5 g/dL (ref 6.0–8.3)

## 2012-12-02 LAB — CBC WITH DIFFERENTIAL/PLATELET
Basophils Absolute: 0 10*3/uL (ref 0.0–0.1)
Basophils Relative: 0.8 % (ref 0.0–3.0)
Eosinophils Absolute: 0.1 10*3/uL (ref 0.0–0.7)
Eosinophils Relative: 1.1 % (ref 0.0–5.0)
HCT: 31.9 % — ABNORMAL LOW (ref 36.0–46.0)
Hemoglobin: 10.6 g/dL — ABNORMAL LOW (ref 12.0–15.0)
Lymphocytes Relative: 29 % (ref 12.0–46.0)
Lymphs Abs: 1.6 10*3/uL (ref 0.7–4.0)
MCHC: 33.2 g/dL (ref 30.0–36.0)
MCV: 85.8 fl (ref 78.0–100.0)
Monocytes Absolute: 0.3 10*3/uL (ref 0.1–1.0)
Monocytes Relative: 5.6 % (ref 3.0–12.0)
Neutro Abs: 3.5 10*3/uL (ref 1.4–7.7)
Neutrophils Relative %: 63.5 % (ref 43.0–77.0)
Platelets: 366 10*3/uL (ref 150.0–400.0)
RBC: 3.71 Mil/uL — ABNORMAL LOW (ref 3.87–5.11)
RDW: 15.4 % — ABNORMAL HIGH (ref 11.5–14.6)
WBC: 5.6 10*3/uL (ref 4.5–10.5)

## 2012-12-02 LAB — LIPID PANEL
Cholesterol: 169 mg/dL (ref 0–200)
HDL: 65.3 mg/dL (ref >39.00)
LDL Cholesterol: 87 mg/dL (ref 0–99)
Total CHOL/HDL Ratio: 3
Triglycerides: 86 mg/dL (ref 0.0–149.0)
VLDL: 17.2 mg/dL (ref 0.0–40.0)

## 2012-12-02 LAB — BASIC METABOLIC PANEL
BUN: 14 mg/dL (ref 6–23)
CO2: 27 mEq/L (ref 19–32)
Calcium: 9.1 mg/dL (ref 8.4–10.5)
Chloride: 107 mEq/L (ref 96–112)
Creatinine, Ser: 0.8 mg/dL (ref 0.4–1.2)
GFR: 83.63 mL/min (ref >60.00)
Glucose, Bld: 91 mg/dL (ref 70–99)
Potassium: 4.1 mEq/L (ref 3.5–5.1)
Sodium: 141 mEq/L (ref 135–145)

## 2012-12-02 LAB — TSH: TSH: 1.7 u[IU]/mL (ref 0.35–5.50)

## 2012-12-09 ENCOUNTER — Encounter: Payer: Self-pay | Admitting: Internal Medicine

## 2012-12-09 ENCOUNTER — Ambulatory Visit (INDEPENDENT_AMBULATORY_CARE_PROVIDER_SITE_OTHER): Payer: BC Managed Care – PPO | Admitting: Internal Medicine

## 2012-12-09 VITALS — BP 132/80 | HR 78 | Temp 97.8°F | Wt 220.0 lb

## 2012-12-09 DIAGNOSIS — I10 Essential (primary) hypertension: Secondary | ICD-10-CM

## 2012-12-09 DIAGNOSIS — Z9189 Other specified personal risk factors, not elsewhere classified: Secondary | ICD-10-CM

## 2012-12-09 DIAGNOSIS — M129 Arthropathy, unspecified: Secondary | ICD-10-CM

## 2012-12-09 DIAGNOSIS — D649 Anemia, unspecified: Secondary | ICD-10-CM

## 2012-12-09 DIAGNOSIS — L989 Disorder of the skin and subcutaneous tissue, unspecified: Secondary | ICD-10-CM

## 2012-12-09 DIAGNOSIS — E785 Hyperlipidemia, unspecified: Secondary | ICD-10-CM

## 2012-12-09 DIAGNOSIS — Z0000 Encounter for general adult medical examination without abnormal findings: Secondary | ICD-10-CM

## 2012-12-09 DIAGNOSIS — Z872 Personal history of diseases of the skin and subcutaneous tissue: Secondary | ICD-10-CM

## 2012-12-09 MED ORDER — FERREX 150 FORTE PLUS 50-100 MG PO CAPS: 1.0000 | ORAL_CAPSULE | Freq: Every day | ORAL | Status: DC

## 2012-12-09 MED ORDER — CLOBETASOL PROPIONATE 0.05 % EX OINT: TOPICAL_OINTMENT | Freq: Two times a day (BID) | CUTANEOUS | Status: DC

## 2012-12-09 NOTE — Patient Instructions (Addendum)
Continue to check BP  Readings.  Try topical steroid and if  persistent or progressive see derm or here for reeval You are still anemic   . Iron  Once a day  And recheck in 2-3 months  [facility] Is a [facility] .  [id number]  [id number]  Wt Readings from Last 3 Encounters:  12/09/12 220 lb (99.791 kg)  09/20/12 223 lb 6.4 oz (101.334 kg)  07/28/12 217 lb (98.431 kg)

## 2012-12-09 NOTE — Progress Notes (Signed)
Chief Complaint  Patient presents with  . Annual Exam  . Hypertension    HPI: Patient comes in today for Preventive Health Care visit  Since her last visit she has had her GYN check has traveled to [place] and feels pretty good after her partial retirement.   Blood pressure 137/ 76  120 135/89 in am  One 144  Has seen Dr. Kellie Simmering and gave her cortisone shots in her elbow the feeling that it was possibly tendinitis. He has a diagnosis of polyarthralgia. She's also had some right shoulder tendinitis.  ROS:  GEN/ HEENT: No fever, significant weight changes sweats headaches vision problems hearing changes, CV/ PULM; No chest pain shortness of breath cough, syncope,edema  change in exercise tolerance. GI /GU: No adominal pain, vomiting, change in bowel habits. No blood in the stool. No significant GU symptoms. SKIN/HEME: ,no acute skin rashes suspicious lesions or bleeding.  Area left arm for months scaly in between dermatologist   Needs temovate ointment not cream No lymphadenopathy, nodules, masses.  NEURO/ PSYCH:  No neurologic signs such as weakness numbness. No depression anxiety. IMM/ Allergy: No unusual infections.  Allergy .   REST of 12 system review negative except as per HPI No active bleeding  Blood in stool   Had anemia with past hx of ulcer but stimach sensitive to iron.    Past Medical History  Diagnosis Date  . HYPERLIPIDEMIA 12/29/2006    takes Lovastatin nightly  . Right bundle branch block 02/09/2007  . IBS 04/28/2007  . MENOPAUSAL DISORDER 12/07/2007  . Rheumatoid arthritis(714.0) 12/29/2006  . SYNCOPE 12/07/2007  . WEIGHT GAIN 12/18/2008  . Palpitations 02/09/2007  . UNS ADVRS EFF OTH RX MEDICINAL\T\BIOLOGICAL SBSTNC 02/09/2007  . GASTRIC ULCER, ACUTE, HEMORRHAGE, HX OF 02/09/2007  . COLONIC POLYPS, HX OF 02/09/2007  . REDUCTION MAMMOPLASTY, HX OF 02/09/2007  . HYPERTENSION 12/29/2006    takes Enalapril daily  . Headache(784.0)     occasionally  .  CHOLECYSTECTOMY, HX OF   . Fibromyalgia     doesn't require meds  . Joint pain   . Joint swelling   . Chronic back pain   . Bruises easily   . H/O hiatal hernia   . GERD (gastroesophageal reflux disease)     takes Nexium daily  . UTI (lower urinary tract infection)     hx of;saw urologist last yr and he told her she was over reacting and not a big deal   . Anemia   . Hx of seasonal allergies     takes Claritin daily  . Insomnia     related to pain;takes Flexeril and Tylenol PM nightly  . S/P lumbar fusion 6 13 09/03/2011    L4 L5  posterior   . Cough, persistent 01/21/2012    poss from acei   . 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 Abnormal Pap smear     H/O  . Goiter     Family History  Problem Relation Age of Onset  . Diabetes Mother   . Colon cancer Mother     x2  . Arthritis Mother   . Hypertension Mother   . Stroke Father   . Heart disease Father   . Arthritis Brother   . Hypertension Brother   . Hyperlipidemia Brother   . Anesthesia problems Neg Hx   . Hypotension Neg Hx   . Malignant hyperthermia Neg Hx   . Pseudochol deficiency Neg Hx   . Other  DISH brother  . Endometrial cancer Mother   . Colon cancer Father   . Hypertension Maternal Grandmother   . Stroke Maternal Grandmother   . Stroke Father   . Infertility Brother     History   Social History  . Marital Status: Widowed    Spouse Name: N/A    Number of Children: N/A  . Years of Education: N/A   Social History Main Topics  . Smoking status: Never Smoker   . Smokeless tobacco: Never Used  . Alcohol Use: No     Comment: very rare  . Drug Use: No  . Sexual Activity: No     Comment: TAH/BSO   Other Topics Concern  . 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  Acetaminophen (TYLENOL ARTHRITIS PAIN PO) Take 650 mg by mouth.       Marland Kitchen amLODipine (NORVASC) 5 MG tablet Take 1 tablet (5 mg total) by mouth daily.  90 tablet  0  . cyclobenzaprine (FLEXERIL) 5 MG tablet Take 2.5 mg by mouth at bedtime as needed.        . Diphenhydramine-APAP, sleep, (TYLENOL PM EXTRA STRENGTH PO) Take 1 tablet by mouth at bedtime as needed. For sleep      . esomeprazole (NEXIUM) 40 MG capsule Take 40 mg by mouth every morning before breakfast.        . estradiol (VIVELLE-DOT) 0.025 MG/24HR Place 1 patch onto the skin 2 (two) times a week.  24 patch  4  . losartan (COZAAR) 100 MG tablet Take 1 tablet (100 mg total) by mouth daily.  90 tablet  0  . lovastatin (MEVACOR) 20 MG tablet Take 1 tablet (20 mg total) by mouth at bedtime.  90 tablet  0  . Multiple Vitamin (MULTIVITAMIN) tablet Take 1 tablet by mouth daily.        . [DISCONTINUED] clobetasol cream (TEMOVATE) 0.05 % Apply 1 application topically 2 (two) times daily.  90 g  1  . clobetasol ointment (TEMOVATE) 0.05 % Apply topically 2 (two) times daily. As needed  Limit to  4 weeks at a time  45 g  2  . Fe-Succ Ac-C-Thre Ac-B12-FA (FERREX 150 FORTE PLUS) 50-100 MG CAPS Take 1 capsule by mouth daily.  30 each  3  . [DISCONTINUED] acetaminophen (TYLENOL) 650 MG CR tablet Take 1,300 mg by mouth daily.       . [DISCONTINUED] fluconazole (DIFLUCAN) 150 MG tablet Take one tablet once, may repeat in 48 hours if needed.  2 tablet  0  . [DISCONTINUED] loratadine (CLARITIN) 10 MG tablet Take 10 mg by mouth as needed for allergies.      . [DISCONTINUED] predniSONE (DELTASONE) 5 MG tablet Take 5 mg by mouth as needed.       No facility-administered encounter medications on file as of 12/09/2012.    EXAM:  BP 132/80  Pulse 78  Temp(Src) 97.8 F (36.6 C) (Oral)  Wt 220 lb (99.791 kg)  BMI 37.74 kg/m2  SpO2 98%  LMP 03/02/1984  Body mass index is 37.74 kg/(m^2).  Physical Exam: Vital signs reviewed VHQ:IONG is a well-developed  well-nourished alert cooperative   female who appears her stated age in no acute distress.  HEENT: normocephalic atraumatic , Eyes:  PERRL EOM's full, conjunctiva clear, Nares: paten,t no deformity discharge or tenderness., Ears: no deformity EAC's clear TMs with normal landmarks. Mouth: clear OP, no lesions, edema.  Moist mucous membranes. Dentition in adequate repair. NECK: supple without masses, thyromegaly or bruits. CHEST/PULM:  Clear to auscultation and percussion breath sounds equal no wheeze , rales or rhonchi. No chest wall  tenderness. Mild to moderate kyphosis Breast: normal by inspection . No dimpling, discharge, masses, tenderness or discharge . Well-healed scars CV: PMI is nondisplaced, S1 S2 no gallops, murmurs, rubs. Peripheral pulses are full without delay.No JVD .  ABDOMEN: Bowel sounds normal nontender  No guard or rebound, no hepato splenomegal no CVA tenderness.  No hernia. Extremtities:  No clubbing cyanosis or edema, no acute joint swelling or redness no focal atrophy NEURO:  Oriented x3, cranial nerves 3-12 appear to be intact, no obvious focal weakness,gait within normal limits no abnormal reflexes or asymmetrical SKIN: No acute rashes normal turgor, color, no bruising or petechiae. right elbow with small plaque  Red scaly and left elbow a slightly raised  PSYCH: Oriented, good eye contact, no obvious depression anxiety, cognition and judgment appear normal. LN: no cervical axillary inguinal adenopathy  Lab Results  Component Value Date   WBC 5.6 12/02/2012   HGB 10.6* 12/02/2012   HCT 31.9* 12/02/2012   PLT 366.0 12/02/2012   GLUCOSE 91 12/02/2012   CHOL 169 12/02/2012   TRIG 86.0 12/02/2012   HDL 65.30 12/02/2012   LDLDIRECT 114.0 04/02/2010   LDLCALC 87 12/02/2012   ALT 18 12/02/2012   AST 20 12/02/2012   NA 141 12/02/2012   K 4.1 12/02/2012   CL 107 12/02/2012   CREATININE 0.8 12/02/2012   BUN 14 12/02/2012   CO2 27 12/02/2012   TSH 1.70 12/02/2012    ASSESSMENT AND  PLAN:  Discussed the following assessment and plan:  Visit for preventive health examination  HYPERTENSION - better on repeat  monitor  HYPERLIPIDEMIA  Anemia, unspecified - low ibc in the past hx of ulcer  iron for 2 months otc not well tolerated   Hx of diethylstilbestrol (DES) exposure in utero unknown  Anemia  Hx of psoriasis  Skin lesion - left elbow  ? atypical for psoriasis vs other  rx with stseroid ointment and fu if persostent here or skin derm  ARTHRITIS - Has had a number of different diagnosis first; psoriatic than rheumatoid now DJD followed by rheumatology Counseled regarding healthy nutrition, exercise, sleep, injury prevention, calcium vit d and healthy weight . Sates utd o dexa is at risk with amount of steroids she had had  Patient Care Team: Madelin Headings, MD as PCP - General Donnal Moat, MD (Internal Medicine) Griffith Citron, MD (Gastroenterology) Annamaria Boots, MD (Gynecology) Wendall Stade, MD (Cardiology) Hilliard Clark. [facility] (Urology) Patient Instructions   Continue to check BP  Readings.  Try topical steroid and if  persistent or progressive see derm or here for reeval You are still anemic   . Iron  Once a day  And recheck in 2-3 months  [facility]. Is a good practice .  [id number]  [id number]  Wt Readings from Last 3 Encounters:  12/09/12 220 lb (99.791 kg)  09/20/12 223 lb 6.4 oz (101.334 kg)  07/28/12 217 lb (98.431 kg)      Burna Mortimer K. Aneta Hendershott M.D.  Health Maintenance  Topic Date Due  . Zostavax  05/26/2012  . Influenza Vaccine  09/30/2013  . Mammogram  06/14/2014  . Pap Smear  09/21/2015  . Tetanus/tdap  05/10/2018  . Colonoscopy  09/30/2021   Health Maintenance Review

## 2012-12-10 ENCOUNTER — Encounter: Payer: Self-pay | Admitting: Internal Medicine

## 2013-01-17 ENCOUNTER — Other Ambulatory Visit: Payer: Self-pay | Admitting: Family Medicine

## 2013-01-17 MED ORDER — LOSARTAN POTASSIUM 100 MG PO TABS: 100.0000 mg | ORAL_TABLET | Freq: Every day | ORAL | Status: DC

## 2013-01-17 MED ORDER — LOVASTATIN 20 MG PO TABS: 20.0000 mg | ORAL_TABLET | Freq: Every day | ORAL | Status: DC

## 2013-02-10 ENCOUNTER — Other Ambulatory Visit (INDEPENDENT_AMBULATORY_CARE_PROVIDER_SITE_OTHER): Payer: BC Managed Care – PPO

## 2013-02-10 DIAGNOSIS — D649 Anemia, unspecified: Secondary | ICD-10-CM

## 2013-02-10 LAB — CBC WITH DIFFERENTIAL/PLATELET
Basophils Absolute: 0 10*3/uL (ref 0.0–0.1)
Basophils Relative: 0.3 % (ref 0.0–3.0)
Eosinophils Absolute: 0 10*3/uL (ref 0.0–0.7)
Eosinophils Relative: 0.1 % (ref 0.0–5.0)
HCT: 34 % — ABNORMAL LOW (ref 36.0–46.0)
Hemoglobin: 11.3 g/dL — ABNORMAL LOW (ref 12.0–15.0)
Lymphocytes Relative: 22.9 % (ref 12.0–46.0)
Lymphs Abs: 1.8 10*3/uL (ref 0.7–4.0)
MCHC: 33.3 g/dL (ref 30.0–36.0)
MCV: 86.8 fl (ref 78.0–100.0)
Monocytes Absolute: 0.2 10*3/uL (ref 0.1–1.0)
Monocytes Relative: 2.4 % — ABNORMAL LOW (ref 3.0–12.0)
Neutro Abs: 5.9 10*3/uL (ref 1.4–7.7)
Neutrophils Relative %: 74.3 % (ref 43.0–77.0)
Platelets: 394 10*3/uL (ref 150.0–400.0)
RBC: 3.91 Mil/uL (ref 3.87–5.11)
RDW: 16 % — ABNORMAL HIGH (ref 11.5–14.6)
WBC: 7.9 10*3/uL (ref 4.5–10.5)

## 2013-02-28 ENCOUNTER — Ambulatory Visit (INDEPENDENT_AMBULATORY_CARE_PROVIDER_SITE_OTHER): Payer: BC Managed Care – PPO | Admitting: Internal Medicine

## 2013-02-28 ENCOUNTER — Other Ambulatory Visit: Payer: Self-pay | Admitting: Family Medicine

## 2013-02-28 ENCOUNTER — Encounter: Payer: Self-pay | Admitting: Internal Medicine

## 2013-02-28 ENCOUNTER — Telehealth: Payer: Self-pay | Admitting: Internal Medicine

## 2013-02-28 VITALS — BP 140/82 | HR 89 | Temp 98.1°F | Wt 230.0 lb

## 2013-02-28 DIAGNOSIS — Z79890 Hormone replacement therapy (postmenopausal): Secondary | ICD-10-CM

## 2013-02-28 DIAGNOSIS — Z23 Encounter for immunization: Secondary | ICD-10-CM

## 2013-02-28 DIAGNOSIS — I10 Essential (primary) hypertension: Secondary | ICD-10-CM

## 2013-02-28 DIAGNOSIS — D649 Anemia, unspecified: Secondary | ICD-10-CM

## 2013-02-28 MED ORDER — AMLODIPINE BESYLATE 5 MG PO TABS: 5.0000 mg | ORAL_TABLET | Freq: Every day | ORAL | Status: DC

## 2013-02-28 MED ORDER — FE FUM-VIT C-VIT B12-FA 460-60-0.01-1 MG PO CAPS: 1.0000 | ORAL_CAPSULE | Freq: Every day | ORAL | Status: DC

## 2013-02-28 NOTE — Telephone Encounter (Signed)
Resent

## 2013-02-28 NOTE — Patient Instructions (Addendum)
Continue   Iron rich diet   Can look into  chromagen  Contact dr Hyacinth Meeker office about 90 day refill of patch and we will flag her office.  Continue Bp checks   3  times a week.  prevnar 13  And  Then repeat pneumovax in a year.   ROV in 3 months  And cbc pre visit

## 2013-02-28 NOTE — Progress Notes (Signed)
Chief Complaint  Patient presents with  . Follow-up  . Anemia  . Hypertension    HPI:  Patient comes in today for follow up of  multiple medical problems.    Anemia : Iron supp  Caused cramps and diarrhea  Only took    About a month worth  . increasing iron in diet  chromagen   Tolerated in the past ? To try this I  HT  130 140/80  At home .  On meds needs 90 day refill  Only 30 given last time  Disc immuniz had pneumovax in past    ROS: See pertinent positives and negatives per HPI.  Past Medical History  Diagnosis Date  . HYPERLIPIDEMIA 12/29/2006    takes Lovastatin nightly  . Right bundle branch block 02/09/2007  . IBS 04/28/2007  . MENOPAUSAL DISORDER 12/07/2007  . Rheumatoid arthritis(714.0) 12/29/2006  . SYNCOPE 12/07/2007  . WEIGHT GAIN 12/18/2008  . Palpitations 02/09/2007  . UNS ADVRS EFF OTH RX MEDICINAL\T\BIOLOGICAL SBSTNC 02/09/2007  . GASTRIC ULCER, ACUTE, HEMORRHAGE, HX OF 02/09/2007  . COLONIC POLYPS, HX OF 02/09/2007  . REDUCTION MAMMOPLASTY, HX OF 02/09/2007  . HYPERTENSION 12/29/2006    takes Enalapril daily  . Headache(784.0)     occasionally  . CHOLECYSTECTOMY, HX OF   . Fibromyalgia     doesn't require meds  . Joint pain   . Joint swelling   . Chronic back pain   . Bruises easily   . H/O hiatal hernia   . GERD (gastroesophageal reflux disease)     takes Nexium daily  . UTI (lower urinary tract infection)     hx of;saw urologist last yr and he told her she was over reacting and not a big deal   . Anemia   . Hx of seasonal allergies     takes Claritin daily  . Insomnia     related to pain;takes Flexeril and Tylenol PM nightly  . S/P lumbar fusion 6 13 09/03/2011    L4 L5  posterior   . Cough, persistent 01/21/2012    poss from acei   . 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 Abnormal Pap smear     H/O  . Goiter     Family History  Problem Relation Age of Onset  . Diabetes Mother   . Colon cancer Mother     x2  . Arthritis Mother   .  Hypertension Mother   . Stroke Father   . Heart disease Father   . Arthritis Brother   . Hypertension Brother   . Hyperlipidemia Brother   . Anesthesia problems Neg Hx   . Hypotension Neg Hx   . Malignant hyperthermia Neg Hx   . Pseudochol deficiency Neg Hx   . Other      DISH brother  . Endometrial cancer Mother   . Colon cancer Father   . Hypertension Maternal Grandmother   . Stroke Maternal Grandmother   . Stroke Father   . Infertility Brother     History   Social History  . Marital Status: Widowed    Spouse Name: N/A    Number of Children: N/A  . Years of Education: N/A   Social History Main Topics  . Smoking status: Never Smoker   . Smokeless tobacco: Never Used  . Alcohol Use: No     Comment: very rare  . Drug Use: No  . Sexual Activity: No     Comment: TAH/BSO   Other Topics  Concern  . 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 Acetaminophen (TYLENOL ARTHRITIS PAIN PO) Take 650 mg by mouth.   Marland Kitchen amLODipine (NORVASC) 5 MG tablet Take 1 tablet (5 mg total) by mouth daily.  . clobetasol ointment (TEMOVATE) 0.05 % Apply topically 2 (two) times daily. As needed  Limit to  4 weeks at a time  . cyclobenzaprine (FLEXERIL) 5 MG tablet Take 2.5 mg by mouth at bedtime as needed.    . Diphenhydramine-APAP, sleep, (TYLENOL PM EXTRA STRENGTH PO) Take 1 tablet by mouth at bedtime as needed. For sleep  . esomeprazole (NEXIUM) 40 MG capsule Take 40 mg by mouth every morning before breakfast.    . estradiol (VIVELLE-DOT) 0.025 MG/24HR Place 1 patch onto the skin 2 (two) times a week.  . losartan (COZAAR) 100 MG tablet Take 1 tablet (100 mg total) by mouth daily.  Marland Kitchen lovastatin (MEVACOR) 20 MG tablet Take 1 tablet (20 mg total) by mouth at bedtime.  . Multiple Vitamin  (MULTIVITAMIN) tablet Take 1 tablet by mouth daily.    . [DISCONTINUED] amLODipine (NORVASC) 5 MG tablet Take 1 tablet (5 mg total) by mouth daily.  . [DISCONTINUED] Fe-Succ Ac-C-Thre Ac-B12-FA (FERREX 150 FORTE PLUS) 50-100 MG CAPS Take 1 capsule by mouth daily.  . [DISCONTINUED] Fe Fum-Vit C-Vit B12-FA (TRIGELS-F) 460-60-0.01-1 MG CAPS capsule Take 1 capsule by mouth daily.    EXAM:  BP 140/82  Pulse 89  Temp(Src) 98.1 F (36.7 C) (Oral)  Wt 230 lb (104.327 kg)  SpO2 97%  LMP 03/02/1984  Body mass index is 39.46 kg/(m^2).  GENERAL: vitals reviewed and listed above, alert, oriented, appears well hydrated and in no acute distress   CV: HRRR, no clubbing cyanosis or  peripheral edema nl cap refill   MS: moves all extremities without noticeable focal  abnormality  PSYCH: pleasant and cooperative, no obvious depression or anxiety Lab Results  Component Value Date   WBC 7.9 02/10/2013   HGB 11.3* 02/10/2013   HCT 34.0* 02/10/2013   MCV 86.8 02/10/2013   PLT 394.0 02/10/2013    ASSESSMENT AND PLAN:  Discussed the following assessment and plan:  Anemia - improved intolerant of iron rx try chomagen forte as used in past.   Unspecified essential hypertension - better on repeat. follow at home adjust med if needed refill today  Need for vaccination with 13-polyvalent pneumococcal conjugate vaccine - Plan: Pneumococcal conjugate vaccine 13-valent  Postmenopausal HRT (hormone replacement therapy) - per dr Hyacinth Meeker  needs 90 day contact her office for rx  Improved but persists  -Patient advised to return or notify health care team  if symptoms worsen or persist or new concerns arise.  Patient Instructions  Continue   Iron rich diet   Can look into  chromagen  Contact dr Hyacinth Meeker office about 90 day refill of patch and we will flag her office.  Continue Bp checks   3  times a week.  prevnar 13  And  Then repeat pneumovax in a year.   ROV in 3 months  And cbc pre visit       [name]. Laiken Nohr M.D.  Pre visit review using our clinic review tool, if applicable. No additional management support is needed unless otherwise documented below in  the visit note.

## 2013-02-28 NOTE — Telephone Encounter (Signed)
Pt states the rx Fe Fum-Vit C-Vit B12-FA (TRIGELS-F) 460-60-0.01-1 MG CAPS capsule was not received by the pharm and she would like you to resend  please

## 2013-05-16 ENCOUNTER — Other Ambulatory Visit: Payer: Self-pay

## 2013-05-16 DIAGNOSIS — Z9889 Other specified postprocedural states: Secondary | ICD-10-CM

## 2013-05-16 DIAGNOSIS — Z1231 Encounter for screening mammogram for malignant neoplasm of breast: Secondary | ICD-10-CM

## 2013-05-24 ENCOUNTER — Other Ambulatory Visit: Payer: BC Managed Care – PPO

## 2013-05-25 ENCOUNTER — Other Ambulatory Visit (INDEPENDENT_AMBULATORY_CARE_PROVIDER_SITE_OTHER): Payer: BC Managed Care – PPO

## 2013-05-25 DIAGNOSIS — D649 Anemia, unspecified: Secondary | ICD-10-CM

## 2013-05-25 LAB — CBC WITH DIFFERENTIAL/PLATELET
Basophils Absolute: 0 10*3/uL (ref 0.0–0.1)
Basophils Relative: 0.5 % (ref 0.0–3.0)
Eosinophils Absolute: 0 10*3/uL (ref 0.0–0.7)
Eosinophils Relative: 0.9 % (ref 0.0–5.0)
HCT: 32.6 % — ABNORMAL LOW (ref 36.0–46.0)
Hemoglobin: 10.8 g/dL — ABNORMAL LOW (ref 12.0–15.0)
Lymphocytes Relative: 27.6 % (ref 12.0–46.0)
Lymphs Abs: 1.5 10*3/uL (ref 0.7–4.0)
MCHC: 33 g/dL (ref 30.0–36.0)
MCV: 87.1 fl (ref 78.0–100.0)
Monocytes Absolute: 0.3 10*3/uL (ref 0.1–1.0)
Monocytes Relative: 5.6 % (ref 3.0–12.0)
Neutro Abs: 3.4 10*3/uL (ref 1.4–7.7)
Neutrophils Relative %: 65.4 % (ref 43.0–77.0)
Platelets: 347 10*3/uL (ref 150.0–400.0)
RBC: 3.75 Mil/uL — ABNORMAL LOW (ref 3.87–5.11)
RDW: 14.6 % (ref 11.5–14.6)
WBC: 5.3 10*3/uL (ref 4.5–10.5)

## 2013-05-31 ENCOUNTER — Encounter: Payer: Self-pay | Admitting: Internal Medicine

## 2013-05-31 ENCOUNTER — Telehealth: Payer: Self-pay | Admitting: Internal Medicine

## 2013-05-31 ENCOUNTER — Ambulatory Visit (INDEPENDENT_AMBULATORY_CARE_PROVIDER_SITE_OTHER): Payer: BC Managed Care – PPO | Admitting: Internal Medicine

## 2013-05-31 VITALS — BP 138/80 | Temp 97.9°F | Ht 64.0 in | Wt 227.0 lb

## 2013-05-31 DIAGNOSIS — D649 Anemia, unspecified: Secondary | ICD-10-CM

## 2013-05-31 DIAGNOSIS — Z8719 Personal history of other diseases of the digestive system: Secondary | ICD-10-CM

## 2013-05-31 DIAGNOSIS — T887XXA Unspecified adverse effect of drug or medicament, initial encounter: Secondary | ICD-10-CM

## 2013-05-31 DIAGNOSIS — M1990 Unspecified osteoarthritis, unspecified site: Secondary | ICD-10-CM

## 2013-05-31 DIAGNOSIS — M129 Arthropathy, unspecified: Secondary | ICD-10-CM

## 2013-05-31 DIAGNOSIS — I10 Essential (primary) hypertension: Secondary | ICD-10-CM

## 2013-05-31 MED ORDER — MULTIGEN 70 MG PO TABS: 1.0000 | ORAL_TABLET | Freq: Every day | ORAL | Status: DC

## 2013-05-31 NOTE — Progress Notes (Signed)
Chief Complaint  Patient presents with  . Follow-up    HPI: Jade Cardenas  comes in today for follow up of  multiple medical problems.  BP: seems to hav been ok  Anemia:  Eating   Lots of  iron rich foods.  No bleeding couldn't tolerate  The iron given last time. Nausea stomach ache  Retired but busy.  Tired of feeling bad.  Lack of sleep from pain.  Hips back etc.  Has seen dr Charlestine Night for years current management is  Prednisone as needed  and hydrocodone   Worse with other weather   doesent. seem to control issues .  Wants other options. Worried about bg and bone health with so much pred.   ROS: See pertinent positives and negatives per HPI. No bleeding bowel noted  Hx of gastric ulcer etc tired no acute   Past Medical History  Diagnosis Date  . HYPERLIPIDEMIA 12/29/2006    takes Lovastatin nightly  . Right bundle branch block 02/09/2007  . IBS 04/28/2007  . MENOPAUSAL DISORDER 12/07/2007  . Rheumatoid arthritis(714.0) 12/29/2006  . SYNCOPE 12/07/2007  . WEIGHT GAIN 12/18/2008  . Palpitations 02/09/2007  . UNS ADVRS EFF OTH RX MEDICINAL\T\BIOLOGICAL SBSTNC 02/09/2007  . GASTRIC ULCER, ACUTE, HEMORRHAGE, HX OF 02/09/2007  . COLONIC POLYPS, HX OF 02/09/2007  . REDUCTION MAMMOPLASTY, HX OF 02/09/2007  . HYPERTENSION 12/29/2006    takes Enalapril daily  . Headache(784.0)     occasionally  . CHOLECYSTECTOMY, HX OF   . Fibromyalgia     doesn't require meds  . Joint pain   . Joint swelling   . Chronic back pain   . Bruises easily   . H/O hiatal hernia   . GERD (gastroesophageal reflux disease)     takes Nexium daily  . UTI (lower urinary tract infection)     hx of;saw urologist last yr and he told her she was over reacting and not a big deal   . Anemia   . Hx of seasonal allergies     takes Claritin daily  . Insomnia     related to pain;takes Flexeril and Tylenol PM nightly  . S/P lumbar fusion 6 13 09/03/2011    L4 L5  posterior   . Cough, persistent 01/21/2012   poss from acei   . 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 Abnormal Pap smear     H/O  . Goiter     Family History  Problem Relation Age of Onset  . Diabetes Mother   . Colon cancer Mother     x2  . Arthritis Mother   . Hypertension Mother   . Stroke Father   . Heart disease Father   . Arthritis Brother   . Hypertension Brother   . Hyperlipidemia Brother   . Anesthesia problems Neg Hx   . Hypotension Neg Hx   . Malignant hyperthermia Neg Hx   . Pseudochol deficiency Neg Hx   . Other      DISH brother  . Endometrial cancer Mother   . Colon cancer Father   . Hypertension Maternal Grandmother   . Stroke Maternal Grandmother   . Stroke Father   . Infertility Brother     History   Social History  . Marital Status: Widowed    Spouse Name: N/A    Number of Children: N/A  . Years of Education: N/A   Social History Main Topics  . Smoking status: Never Smoker   . Smokeless  tobacco: Never Used  . Alcohol Use: No     Comment: very rare  . Drug Use: No  . Sexual Activity: No     Comment: TAH/BSO   Other Topics Concern  . 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 Acetaminophen (TYLENOL ARTHRITIS PAIN PO) Take 650 mg by mouth 2 (two) times daily as needed.   Marland Kitchen amLODipine (NORVASC) 5 MG tablet Take 1 tablet (5 mg total) by mouth daily.  . clobetasol ointment (TEMOVATE) 0.05 % Apply topically 2 (two) times daily. As needed  Limit to  4 weeks at a time  . cyclobenzaprine (FLEXERIL) 5 MG tablet Take 2.5 mg by mouth at bedtime as needed.    . Diphenhydramine-APAP, sleep, (TYLENOL PM EXTRA STRENGTH PO) Take 2 tablets by mouth at bedtime as needed. For sleep  . esomeprazole (NEXIUM) 40 MG capsule Take 40 mg by mouth every morning before breakfast.    . estradiol (VIVELLE-DOT)  0.025 MG/24HR Place 1 patch onto the skin 2 (two) times a week.  Marland Kitchen HYDROcodone-acetaminophen (NORCO/VICODIN) 5-325 MG per tablet Take 1 tablet by mouth 2 (two) times daily as needed for moderate pain.  Marland Kitchen losartan (COZAAR) 100 MG tablet Take 1 tablet (100 mg total) by mouth daily.  Marland Kitchen lovastatin (MEVACOR) 20 MG tablet Take 1 tablet (20 mg total) by mouth at bedtime.  . Multiple Vitamin (MULTIVITAMIN) tablet Take 1 tablet by mouth daily.    . predniSONE (DELTASONE) 10 MG tablet   . Fe Fum-Vit C-Vit B12-FA (TRIGELS-F) 460-60-0.01-1 MG CAPS capsule Take 1 capsule by mouth daily.  . mineral/vitamin supplement (MULTIGEN) 70 MG TABS tablet Take 1 tablet (70 mg total) by mouth daily.    EXAM:  BP 138/80  Temp(Src) 97.9 F (36.6 C) (Oral)  Ht 5\' 4"  (1.626 m)  Wt 227 lb (102.967 kg)  BMI 38.95 kg/m2  LMP 03/02/1984  Body mass index is 38.95 kg/(m^2).  GENERAL: vitals reviewed and listed above, alert, oriented, appears well hydrated and in no acute distress HEENT: atraumatic, conjunctiva  clear, no obvious abnormalities on inspection of external nose and ears OP : no lesion edema or exudate  NECK: no obvious masses on inspection palpation  LUNGS: clear to auscultation bilaterally, no wheezes, rales or rhonchi,some kyphosis  CV: HRRR, no clubbing cyanosisnl cap refill  MS: moves all extremities  ? djd changes  PSYCH: pleasant and cooperative, no obvious depression or anxiety Lab Results  Component Value Date   WBC 5.3 05/25/2013   HGB 10.8* 05/25/2013   HCT 32.6* 05/25/2013   PLT 347.0 05/25/2013   GLUCOSE 91 12/02/2012   CHOL 169 12/02/2012   TRIG 86.0 12/02/2012   HDL 65.30 12/02/2012   LDLDIRECT 114.0 04/02/2010   LDLCALC 87 12/02/2012   ALT 18 12/02/2012   AST 20 12/02/2012   NA 141 12/02/2012   K 4.1 12/02/2012   CL 107 12/02/2012   CREATININE 0.8 12/02/2012   BUN 14 12/02/2012   CO2 27 12/02/2012   TSH 1.70 12/02/2012    ASSESSMENT AND PLAN:  Discussed the following assessment and  plan:  Anemia - hx low ibc  intolerance to iron prep follow see gi consider see henme if not getting better   Unspecified essential hypertension  GASTRIC ULCER, ACUTE,  HEMORRHAGE, HX OF  Arthritis - multip dx in past djd def rx for ra in past ,prednisone recently   Medication side effect - iron preparations I agree second opinion reasonable in regard to management of her musculoskeletal joint conditions. Risk benefit assessment of continuing episodic prednisone. -Patient advised to return or notify health care team  if symptoms worsen ,persist or new concerns arise.  Patient Instructions  Try different iron prep. For now.contact us on mychart about if you are taking med or not. (Also could try  The children iron preparations. ) consider seeing hematologist   If needed. Poss need for other intervention. Also see  [name]    About the anemia.   Will do  Another  Rheum opinion.   Somewhat will contact you about . This.  cbcdiff   IBC   In 3 months and ROV>.   Standley Brooking. Velita Quirk M.D.  Pre visit review using our clinic review tool, if applicable. No additional management support is needed unless otherwise documented below in the visit note. Total visit 28mins > 50% spent counseling and coordinating care

## 2013-05-31 NOTE — Patient Instructions (Signed)
Try different iron prep. For now.contact us on mychart about if you are taking med or not. (Also could try  The children iron preparations. ) consider seeing hematologist   If needed. Poss need for other intervention. Also see  [name]    About the anemia.   Will do  Another  Rheum opinion.   Somewhat will contact you about . This.  cbcdiff   IBC   In 3 months and ROV>.

## 2013-05-31 NOTE — Telephone Encounter (Signed)
Relevant patient education assigned to patient using Emmi. ° °

## 2013-06-09 ENCOUNTER — Encounter: Payer: Self-pay | Admitting: Internal Medicine

## 2013-06-14 NOTE — Telephone Encounter (Signed)
Please continue for 3 months worth

## 2013-06-15 ENCOUNTER — Other Ambulatory Visit: Payer: Self-pay | Admitting: Family Medicine

## 2013-06-15 MED ORDER — MULTIGEN 70 MG PO TABS: 1.0000 | ORAL_TABLET | Freq: Every day | ORAL | Status: DC

## 2013-06-16 ENCOUNTER — Ambulatory Visit
Admission: RE | Admit: 2013-06-16 | Discharge: 2013-06-16 | Disposition: A | Discharge status: Home / Self Care | Payer: BC Managed Care – PPO | Source: Ambulatory Visit

## 2013-06-16 DIAGNOSIS — Z9889 Other specified postprocedural states: Secondary | ICD-10-CM

## 2013-06-16 DIAGNOSIS — Z1231 Encounter for screening mammogram for malignant neoplasm of breast: Secondary | ICD-10-CM

## 2013-07-24 ENCOUNTER — Other Ambulatory Visit: Payer: Self-pay | Admitting: Internal Medicine

## 2013-07-25 NOTE — Telephone Encounter (Signed)
Sent to the pharmacy by e-scribe. 

## 2013-08-30 ENCOUNTER — Other Ambulatory Visit (INDEPENDENT_AMBULATORY_CARE_PROVIDER_SITE_OTHER): Payer: BC Managed Care – PPO

## 2013-08-30 DIAGNOSIS — D509 Iron deficiency anemia, unspecified: Secondary | ICD-10-CM

## 2013-08-30 LAB — CBC WITH DIFFERENTIAL/PLATELET
Basophils Absolute: 0 10*3/uL (ref 0.0–0.1)
Basophils Relative: 0.3 % (ref 0.0–3.0)
Eosinophils Absolute: 0 10*3/uL (ref 0.0–0.7)
Eosinophils Relative: 0.2 % (ref 0.0–5.0)
HCT: 36.2 % (ref 36.0–46.0)
Hemoglobin: 12.1 g/dL (ref 12.0–15.0)
Lymphocytes Relative: 22.2 % (ref 12.0–46.0)
Lymphs Abs: 2 10*3/uL (ref 0.7–4.0)
MCHC: 33.3 g/dL (ref 30.0–36.0)
MCV: 86.6 fl (ref 78.0–100.0)
Monocytes Absolute: 0.5 10*3/uL (ref 0.1–1.0)
Monocytes Relative: 6.1 % (ref 3.0–12.0)
Neutro Abs: 6.3 10*3/uL (ref 1.4–7.7)
Neutrophils Relative %: 71.2 % (ref 43.0–77.0)
Platelets: 363 10*3/uL (ref 150.0–400.0)
RBC: 4.17 Mil/uL (ref 3.87–5.11)
RDW: 16 % — ABNORMAL HIGH (ref 11.5–15.5)
WBC: 8.8 10*3/uL (ref 4.0–10.5)

## 2013-08-30 LAB — FERRITIN: Ferritin: 12.1 ng/mL (ref 10.0–291.0)

## 2013-08-30 LAB — IBC PANEL
Iron: 83 ug/dL (ref 42–145)
Saturation Ratios: 21 % (ref 20.0–50.0)
Transferrin: 282.3 mg/dL (ref 212.0–360.0)

## 2013-09-06 ENCOUNTER — Encounter: Payer: Self-pay | Admitting: Internal Medicine

## 2013-09-06 ENCOUNTER — Ambulatory Visit (INDEPENDENT_AMBULATORY_CARE_PROVIDER_SITE_OTHER): Payer: BC Managed Care – PPO | Admitting: Internal Medicine

## 2013-09-06 VITALS — BP 124/74 | HR 85 | Temp 98.2°F | Ht 64.0 in | Wt 222.0 lb

## 2013-09-06 DIAGNOSIS — D508 Other iron deficiency anemias: Secondary | ICD-10-CM

## 2013-09-06 DIAGNOSIS — I10 Essential (primary) hypertension: Secondary | ICD-10-CM

## 2013-09-06 DIAGNOSIS — M129 Arthropathy, unspecified: Secondary | ICD-10-CM

## 2013-09-06 NOTE — Patient Instructions (Signed)
Your anemia and iron level is much better . Continue  The liquid iron.   For a nother 3 months . Goal to  get the  Ferritin to about 50 or so and legs can possible feel   Better .   Blood pressure is good    today .  Keep  appt dr D  For evaluation.

## 2013-09-06 NOTE — Progress Notes (Signed)
Pre visit review using our clinic review tool, if applicable. No additional management support is needed unless otherwise documented below in the visit note.  Chief Complaint  Patient presents with  . Follow-up    HPI: Jade Cardenas  comes in today for follow up of  multiple medical problems.  ANEMIA: saw [name] trial of liquid iron  Able to tolerate daily since then  Legs still an issue no bleeding  ( cannot take nsaids PUR ABSORB liquid iron( spatone) puts in OJ in am  Qd couldn't tolerate pill of iron Multigen) BP new machine seems to be ok up one time  No change in meds . Joints pain problematic  appt with dr D delayed   Neck and left arm shoulder problematic  Hx of neck surgery [facility] Gammon on right  Also foot pain other  On pred and told to take inc dose of vicodin if needed but would rather not an look into reassessment and other options.  ROS: See pertinent positives and negatives per HPI. No fever falling  Cp sob new cough  Gi bleeding  Bruising  Foot pain latera consideing seeing podiatry   Past Medical History  Diagnosis Date  . HYPERLIPIDEMIA 12/29/2006    takes Lovastatin nightly  . Right bundle branch block 02/09/2007  . IBS 04/28/2007  . MENOPAUSAL DISORDER 12/07/2007  . Rheumatoid arthritis(714.0) 12/29/2006  . SYNCOPE 12/07/2007  . WEIGHT GAIN 12/18/2008  . Palpitations 02/09/2007  . UNS ADVRS EFF OTH RX MEDICINAL\T\BIOLOGICAL SBSTNC 02/09/2007  . GASTRIC ULCER, ACUTE, HEMORRHAGE, HX OF 02/09/2007  . COLONIC POLYPS, HX OF 02/09/2007  . REDUCTION MAMMOPLASTY, HX OF 02/09/2007  . HYPERTENSION 12/29/2006    takes Enalapril daily  . Headache(784.0)     occasionally  . CHOLECYSTECTOMY, HX OF   . Fibromyalgia     doesn't require meds  . Joint pain   . Joint swelling   . Chronic back pain   . Bruises easily   . H/O hiatal hernia   . GERD (gastroesophageal reflux disease)     takes Nexium daily  . UTI (lower urinary tract infection)     hx of;saw urologist last yr  and he told her she was over reacting and not a big deal   . Anemia   . Hx of seasonal allergies     takes Claritin daily  . Insomnia     related to pain;takes Flexeril and Tylenol PM nightly  . S/P lumbar fusion 6 13 09/03/2011    L4 L5  posterior   . Cough, persistent 01/21/2012    poss from acei   . 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 Abnormal Pap smear     H/O  . Goiter     Family History  Problem Relation Age of Onset  . Diabetes Mother   . Colon cancer Mother     x2  . Arthritis Mother   . Hypertension Mother   . Stroke Father   . Heart disease Father   . Arthritis Brother   . Hypertension Brother   . Hyperlipidemia Brother   . Anesthesia problems Neg Hx   . Hypotension Neg Hx   . Malignant hyperthermia Neg Hx   . Pseudochol deficiency Neg Hx   . Other      DISH brother  . Endometrial cancer Mother   . Colon cancer Father   . Hypertension Maternal Grandmother   . Stroke Maternal Grandmother   . Stroke Father   .  Infertility Brother     History   Social History  . Marital Status: Widowed    Spouse Name: N/A    Number of Children: N/A  . Years of Education: N/A   Social History Main Topics  . Smoking status: Never Smoker   . Smokeless tobacco: Never Used  . Alcohol Use: No     Comment: very rare  . Drug Use: No  . Sexual Activity: No     Comment: TAH/BSO   Other Topics Concern  . 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 Acetaminophen (TYLENOL ARTHRITIS PAIN PO) Take 650 mg by mouth 2 (two) times daily as needed.   Marland Kitchen alosetron (LOTRONEX) 0.5 MG tablet Take 0.5 mg by mouth daily.  Marland Kitchen amLODipine (NORVASC) 5 MG tablet Take 1 tablet (5 mg total) by mouth daily.  . clobetasol ointment (TEMOVATE) 0.05 % Apply topically 2 (two) times daily. As needed   Limit to  4 weeks at a time  . cyclobenzaprine (FLEXERIL) 5 MG tablet Take 2.5 mg by mouth at bedtime as needed.    . Diphenhydramine-APAP, sleep, (TYLENOL PM EXTRA STRENGTH PO) Take 2 tablets by mouth at bedtime as needed. For sleep  . esomeprazole (NEXIUM) 40 MG capsule Take 40 mg by mouth every morning before breakfast.    . estradiol (VIVELLE-DOT) 0.025 MG/24HR Place 1 patch onto the skin 2 (two) times a week.  . Ferrous Sulfate 5 MG/20ML LIQD Take by mouth.  Marland Kitchen HYDROcodone-acetaminophen (NORCO/VICODIN) 5-325 MG per tablet Take 1 tablet by mouth 3 (three) times daily as needed for moderate pain.   Marland Kitchen losartan (COZAAR) 100 MG tablet TAKE 1 TABLET EVERY DAY  . lovastatin (MEVACOR) 20 MG tablet TAKE 1 TABLET BY MOUTH EVERY DAY AT BEDTIME  . Multiple Vitamin (MULTIVITAMIN) tablet Take 1 tablet by mouth daily.    . predniSONE (DELTASONE) 10 MG tablet   . [DISCONTINUED] Fe Fum-Vit C-Vit B12-FA (TRIGELS-F) 460-60-0.01-1 MG CAPS capsule Take 1 capsule by mouth daily.  . [DISCONTINUED] mineral/vitamin supplement (MULTIGEN) 70 MG TABS tablet Take 1 tablet (70 mg total) by mouth daily.    EXAM:  BP 124/74  Pulse 85  Temp(Src) 98.2 F (36.8 C) (Oral)  Ht 5\' 4"  (1.626 m)  Wt 222 lb (100.699 kg)  BMI 38.09 kg/m2  SpO2 97%  LMP 03/02/1984  Body mass index is 38.09 kg/(m^2).  GENERAL: vitals reviewed and listed above, alert, oriented, appears well hydrated and in no acute distress HEENT: atraumatic, conjunctiva  clear, no obvious abnormalities on inspection of external nose and ears LUNGS: clear to auscultation bilaterally, no wheezes, rales or rhonchi, good air movement CV: HRRR, no clubbing cyanosis or  peripheral edema nl cap refill  MS: moves all extremities without noticeable focal  abnormality PSYCH: pleasant and cooperative, no obvious depression or anxiety  Lab Results  Component Value Date   WBC 8.8 08/30/2013   HGB 12.1 08/30/2013   HCT 36.2 08/30/2013   PLT 363.0 08/30/2013   GLUCOSE 91  12/02/2012   CHOL 169 12/02/2012   TRIG 86.0 12/02/2012   HDL 65.30 12/02/2012   LDLDIRECT 114.0 04/02/2010   LDLCALC 87 12/02/2012   ALT 18 12/02/2012   AST 20 12/02/2012   NA 141 12/02/2012   K 4.1  12/02/2012   CL 107 12/02/2012   CREATININE 0.8 12/02/2012   BUN 14 12/02/2012   CO2 27 12/02/2012   TSH 1.70 12/02/2012     ASSESSMENT AND PLAN:  Discussed the following assessment and plan:  Other iron deficiency anemias - much better  would advise  continue to inc iron stores and fu  3 montsh or so   HYPERTENSION - better due for moitoring lab in 3 months   Arthropathy, unspecified, site unspecified - agree with  reevaluation hx of dx of djd, psoriatic and Ra in the past and recenet (FM?)cant take nsaid cause of gi risk,  -Patient advised to return or notify health care team  if symptoms worsen ,persist or new concerns arise.  Patient Instructions  Your anemia and iron level is much better . Continue  The liquid iron.   For a nother 3 months . Goal to  get the  Ferritin to about 50 or so and legs can possible feel   Better .   Blood pressure is good    today .  Keep  appt dr D  For evaluation.     Standley Brooking. Diannia Hogenson M.D.

## 2013-09-22 ENCOUNTER — Ambulatory Visit (INDEPENDENT_AMBULATORY_CARE_PROVIDER_SITE_OTHER): Payer: BC Managed Care – PPO

## 2013-09-22 ENCOUNTER — Ambulatory Visit (INDEPENDENT_AMBULATORY_CARE_PROVIDER_SITE_OTHER): Payer: BC Managed Care – PPO | Admitting: Podiatrist

## 2013-09-22 ENCOUNTER — Encounter: Payer: Self-pay | Admitting: Podiatrist

## 2013-09-22 VITALS — BP 127/68 | HR 83 | Resp 15 | Ht 65.0 in | Wt 225.0 lb

## 2013-09-22 DIAGNOSIS — R52 Pain, unspecified: Secondary | ICD-10-CM

## 2013-09-22 DIAGNOSIS — L84 Corns and callosities: Secondary | ICD-10-CM

## 2013-09-22 DIAGNOSIS — M8430XA Stress fracture, unspecified site, initial encounter for fracture: Secondary | ICD-10-CM

## 2013-09-22 NOTE — Progress Notes (Signed)
   Subjective:    Patient ID: Jade Cardenas, female    DOB: 1950-05-02, 63 y.o.   MRN: [id number]  HPI Comments: Pt states has had sharp sudden pain occasionally to a constant pain for over 1 month, and is usually when walking.  Pt states the area feels like it is supported in a snugger firmer shoe.  Pt complains of painful callouses.  Foot Pain Associated symptoms include abdominal pain, arthralgias, fatigue, myalgias and a rash.      Review of Systems  Constitutional: Positive for fatigue.  HENT: Positive for tinnitus.   Gastrointestinal: Positive for abdominal pain.  Musculoskeletal: Positive for arthralgias, back pain, gait problem and myalgias.  Skin: Positive for rash.  Hematological: Bruises/bleeds easily.  All other systems reviewed and are negative.      Objective:   Physical Exam  Patient is awake, alert, and oriented x 3.  In no acute distress.  Vascular status is intact with palpable pedal pulses at 2/4 DP and PT bilateral and capillary refill time within normal limits. Neurological sensation is also intact bilaterally via Semmes Weinstein monofilament at 5/5 sites. Light touch, vibratory sensation, Achilles tendon reflex is intact. Dermatological exam reveals skin color, turger and texture as normal. Calluses are present bilateral halluces which are symptomatic and uncomfortable. No open lesions present.  Musculature intact with dorsiflexion, plantarflexion, inversion, eversion.  Pain on direct palpation of the fifth metatarsal right foot is noted. X-rays also show area of concern for a stress fracture in this area as well.     Assessment & Plan:  Stress fracture right fifth metatarsal head, calluses bilateral halluces  Plan: Dispensed a air fracture walker for her use and gave her instructions for wear. She will be seen back in 4 weeks for rex-ray. Also debrided bilateral hallux calluses. If she has any problems or concerns she is instructed to call.

## 2013-09-22 NOTE — Patient Instructions (Signed)
Stress Fracture When too much stress is put on the foot, as may occur in running and jumping sports, the lengthy shafts of the bones of the forefoot become susceptible to breaking due to repetitive stress (stress fracture) because of thinness of these bone. A stress fracture is more common if osteoporosis is present or if inadequate athletic footwear is used. Shoes should be used which adequately support the sole of the foot to absorb the shocks of the activity participated in. Stress fractures are very common in competitive female runners who develop these small cracks on the surface of the bones in their legs and feet. The women most likely to suffer these injuries are those who restrict food intake and those who have irregular periods. Stress fractures usually start out as a minor discomfort in the foot or leg. The completion of fracture due to repetitive loading often occurs near the end of a long run. The pain may dissipate with rest. With the next exercise session, the pain may return earlier in the run. If an athlete notices that it hurts to touch just one spot on a bone and then stops running for a week, he or she may be tempted to return to running too soon. Often the pain is ignored in order to continue with high impact exercise. A stress fracture then develops. The athlete now has to avoid the hard pounding of running, but can ride a bike or swim for exercise once the pain has resolved with normal weight bearing until the fracture heals in 6-12 weeks. The most common sites for stress fractures are the bones in the front of the feet (metatarsals) and the long bone of the lower leg (tibia), but running can cause stress fractures anywhere in the lower extremities or pelvis. DIAGNOSIS  Usually the diagnosis is made by reviewing the patient's history. The bone involved progressively becomes more painful with activities. X-rays may show no break within the first 2-3 weeks that pain begins. A later X-ray may  show signs that the bone is healing. Having a bone scan or MRI usually makes an earlier diagnosis possible. HOME CARE INSTRUCTIONS  Treatment may include a cast or walking shoe.  High impact activities should be stopped until advised by your caregiver.  Wear shoes with adequate shock absorbing abilities and good support of the sole of the foot. This is especially important in the arch of the foot.  Alternative exercise may be undertaken while waiting for healing. This may include bicycling and swimming. If you do not have a cast or splint:  You may walk on your injured foot as tolerated or advised.  Do not put any weight on your injured foot until instructed. Slowly increase the amount of time you walk on the foot as the pain allows or as advised.  Use crutches until you can bear weight without pain. A gradual increase in weight bearing may help.  Apply ice to the injured area for the first 2 days after you have been treated or as directed by your caregiver.  Put ice in a plastic bag.  Place a towel between your skin and the bag.  Leave the ice on for 15-20 minutes at a time, every hour while you are awake.  Only take over-the-counter or prescription medicines for pain or discomfort as directed by your caregiver.  If your caregiver has given you a follow-up appointment, it is very important to keep that appointment. Not keeping the appointment could result in a chronic or permanent   injury, pain, and disability. SEEK IMMEDIATE MEDICAL CARE IF:   Pain is becoming worse rather than better.  Pain is uncontrolled with medicine.  You have increased swelling or redness in the foot.  The feeling in the foot or leg is diminished. MAKE SURE YOU:   Understand these instructions.  Will watch your condition.  Will get help right away if you are not doing well or get worse. Document Released: 05/09/2002 Document Revised: 06/13/2012 Document Reviewed: 10/03/2007 [employer] Patient  Information 2015 [employer]. This information is not intended to replace advice given to you by your health care provider. Make sure you discuss any questions you have with your health care provider.  

## 2013-09-25 ENCOUNTER — Encounter: Payer: Self-pay | Admitting: Obstetrics & Gynecology

## 2013-09-25 ENCOUNTER — Ambulatory Visit (INDEPENDENT_AMBULATORY_CARE_PROVIDER_SITE_OTHER): Payer: BC Managed Care – PPO | Admitting: Obstetrics & Gynecology

## 2013-09-25 VITALS — BP 118/62 | HR 68 | Resp 20 | Ht 64.0 in | Wt 222.2 lb

## 2013-09-25 DIAGNOSIS — Z0000 Encounter for general adult medical examination without abnormal findings: Secondary | ICD-10-CM

## 2013-09-25 DIAGNOSIS — M8580 Other specified disorders of bone density and structure, unspecified site: Secondary | ICD-10-CM

## 2013-09-25 DIAGNOSIS — M899 Disorder of bone, unspecified: Secondary | ICD-10-CM

## 2013-09-25 DIAGNOSIS — Z124 Encounter for screening for malignant neoplasm of cervix: Secondary | ICD-10-CM

## 2013-09-25 DIAGNOSIS — Z01419 Encounter for gynecological examination (general) (routine) without abnormal findings: Secondary | ICD-10-CM

## 2013-09-25 DIAGNOSIS — M949 Disorder of cartilage, unspecified: Secondary | ICD-10-CM

## 2013-09-25 LAB — POCT URINALYSIS DIPSTICK
Bilirubin, UA: NEGATIVE
Blood, UA: NEGATIVE
Glucose, UA: NEGATIVE
Ketones, UA: NEGATIVE
Leukocytes, UA: NEGATIVE
Nitrite, UA: NEGATIVE
Protein, UA: NEGATIVE
Urobilinogen, UA: NEGATIVE
pH, UA: 5

## 2013-09-25 MED ORDER — ESTRADIOL 0.025 MG/24HR TD PTTW: 1.0000 | MEDICATED_PATCH | TRANSDERMAL | Status: DC

## 2013-09-25 NOTE — Progress Notes (Signed)
63 y.o. G0P0000 WidowedCaucasianF here for annual exam.  Recent stress fracture on right foot.  Reports she had weeks of pain and finally decided to be seen.  X ray showed the fracture.    Pt saw Dr. Regis Bill in early July.  Hb was normal.  Ferritin still low.  On liquid iron.  Having ferritin rechecked again in three months.  Eating more red meat.   Patient's last menstrual period was 03/02/1984.          Sexually active: No.  The current method of family planning is status post hysterectomy.    Exercising: Yes.    walking dog 3 x daily Smoker:  no  Health Maintenance: Pap:  09/20/12-WNL History of abnormal Pap:  Yes years ago MMG:  06/16/13-normal Colonoscopy:  2013-repeat in 5 years, Dr. [name] BMD:   11/11 TDaP:  05/17/08 Screening Labs: PCP, Hb today: PCP, Urine today: negative   reports that she has never smoked. She has never used smokeless tobacco. She reports that she drinks alcohol. She reports that she does not use illicit drugs.  Past Medical History  Diagnosis Date  . HYPERLIPIDEMIA 12/29/2006    takes Lovastatin nightly  . Right bundle branch block 02/09/2007  . IBS 04/28/2007  . MENOPAUSAL DISORDER 12/07/2007  . Rheumatoid arthritis(714.0) 12/29/2006  . SYNCOPE 12/07/2007  . WEIGHT GAIN 12/18/2008  . Palpitations 02/09/2007  . UNS ADVRS EFF OTH RX MEDICINAL\T\BIOLOGICAL SBSTNC 02/09/2007  . GASTRIC ULCER, ACUTE, HEMORRHAGE, HX OF 02/09/2007  . COLONIC POLYPS, HX OF 02/09/2007  . REDUCTION MAMMOPLASTY, HX OF 02/09/2007  . HYPERTENSION 12/29/2006    takes Enalapril daily  . Headache(784.0)     occasionally  . CHOLECYSTECTOMY, HX OF   . Fibromyalgia     doesn't require meds  . Joint pain   . Joint swelling   . Chronic back pain   . Bruises easily   . H/O hiatal hernia   . GERD (gastroesophageal reflux disease)     takes Nexium daily  . UTI (lower urinary tract infection)     hx of;saw urologist last yr and he told her she was over reacting and not a big deal    . Anemia   . Hx of seasonal allergies     takes Claritin daily  . Insomnia     related to pain;takes Flexeril and Tylenol PM nightly  . S/P lumbar fusion 6 13 09/03/2011    L4 L5  posterior   . Cough, persistent 01/21/2012    poss from acei   . 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 Abnormal Pap smear     H/O  . Goiter   . Arthritis     rheumatoid and osteo    Past Surgical History  Procedure Laterality Date  . Breast reduction surgery      last 80's  . Cervical fusion      2 12   . Tonsillectomy      as a child  . Abdominal hysterectomy  1986    TAH/BSO  . Cholecystectomy  1996  . Back surgery  12+yrs ago    Synovial Cyst removal  . Bone spur  6-3yrs ago    right ankle  . Colonoscopy    . Back surgery  6/13    lumbar L4-L5 fusion, lamenectomy  . Blood transfused  6/13    transfused 2 units post op from back surgery    Current Outpatient Prescriptions  Medication Sig Dispense Refill  .  Acetaminophen (TYLENOL ARTHRITIS PAIN PO) Take 650 mg by mouth 2 (two) times daily as needed.       Marland Kitchen alosetron (LOTRONEX) 0.5 MG tablet Take 0.5 mg by mouth daily.      Marland Kitchen amLODipine (NORVASC) 5 MG tablet Take 1 tablet (5 mg total) by mouth daily.  90 tablet  3  . clobetasol ointment (TEMOVATE) 0.05 % Apply topically 2 (two) times daily. As needed  Limit to  4 weeks at a time  45 g  2  . Diphenhydramine-APAP, sleep, (TYLENOL PM EXTRA STRENGTH PO) Take 2 tablets by mouth at bedtime as needed. For sleep      . esomeprazole (NEXIUM) 40 MG capsule Take 40 mg by mouth every morning before breakfast.        . estradiol (VIVELLE-DOT) 0.025 MG/24HR Place 1 patch onto the skin 2 (two) times a week.  24 patch  4  . Ferrous Sulfate 5 MG/20ML LIQD Take by mouth.      Marland Kitchen HYDROcodone-acetaminophen (NORCO/VICODIN) 5-325 MG per tablet Take 1 tablet by mouth 3 (three) times daily as needed for moderate pain.       Marland Kitchen losartan (COZAAR) 100 MG tablet TAKE 1 TABLET EVERY DAY  90 tablet  1  . lovastatin  (MEVACOR) 20 MG tablet TAKE 1 TABLET BY MOUTH EVERY DAY AT BEDTIME  90 tablet  1  . Multiple Vitamin (MULTIVITAMIN) tablet Take 1 tablet by mouth daily.        . cyclobenzaprine (FLEXERIL) 5 MG tablet Take 2.5 mg by mouth at bedtime as needed.        . predniSONE (DELTASONE) 10 MG tablet        No current facility-administered medications for this visit.    Family History  Problem Relation Age of Onset  . Diabetes Mother   . Colon cancer Mother     x2  . Arthritis Mother   . Hypertension Mother   . Stroke Father   . Heart disease Father   . Arthritis Brother   . Hypertension Brother   . Hyperlipidemia Brother   . Anesthesia problems Neg Hx   . Hypotension Neg Hx   . Malignant hyperthermia Neg Hx   . Pseudochol deficiency Neg Hx   . Other      DISH brother  . Endometrial cancer Mother   . Colon cancer Father   . Hypertension Maternal Grandmother   . Stroke Maternal Grandmother   . Stroke Father   . Infertility Brother     ROS:  Pertinent items are noted in HPI.  Otherwise, a comprehensive ROS was negative.  Exam:   BP 118/62  Pulse 68  Resp 20  Ht 5\' 4"  (1.626 m)  Wt 222 lb 3.2 oz (100.789 kg)  BMI 38.12 kg/m2  LMP 03/02/1984   Height: 5\' 4"  (162.6 cm)  Ht Readings from Last 3 Encounters:  09/25/13 5\' 4"  (1.626 m)  09/22/13 5\' 5"  (1.651 m)  09/06/13 5\' 4"  (1.626 m)    General appearance: alert, cooperative and appears stated age Head: Normocephalic, without obvious abnormality, atraumatic Neck: no adenopathy, supple, symmetrical, trachea midline and thyroid normal to inspection and palpation Lungs: clear to auscultation bilaterally Breasts: normal appearance, no masses or tenderness Heart: regular rate and rhythm Abdomen: soft, non-tender; bowel sounds normal; no masses,  no organomegaly Extremities: extremities normal, atraumatic, no cyanosis or edema Skin: Skin color, texture, turgor normal. No rashes or lesions Lymph nodes: Cervical, supraclavicular, and  axillary nodes normal. No  abnormal inguinal nodes palpated Neurologic: Grossly normal   Pelvic: External genitalia:  no lesions              Urethra:  normal appearing urethra with no masses, tenderness or lesions              Bartholins and Skenes: normal                 Vagina: normal appearing vagina with normal color and discharge, no lesions              Cervix: absent              Pap taken: Yes.   Bimanual Exam:  Uterus:  uterus absent              Adnexa: normal adnexa and no mass, fullness, tenderness               Rectovaginal: Confirms               Anus:  normal sphincter tone, no lesions  A:  Well Woman with normal exam  S/p TAH/BSO  H/O DES exposure  Family hx of colon cancer  Anemia, resolved, but now low ferritin.  Followed by Dr. Regis Bill. Arthritis.  Seeing Dr. Estanislado Pandy 10/27/13. Foot fracture, recent.  P: Mammogram yearly  pap smear today. Done yearly due to DES exposure  On Vivelle 0.025mg  patches. Mail order for 90 day rx given to pt.  She requests paper copy.  Using patch only once weekly  She will try to wean off this fall/winter. BMD this year.  Order placed.  Pt will schedule.   return annually or prn  An After Visit Summary was printed and given to the patient.

## 2013-09-27 LAB — IPS PAP TEST WITH REFLEX TO HPV

## 2013-09-28 ENCOUNTER — Encounter: Payer: Self-pay | Admitting: Obstetrics & Gynecology

## 2013-09-28 ENCOUNTER — Ambulatory Visit
Admission: RE | Admit: 2013-09-28 | Discharge: 2013-09-28 | Disposition: A | Discharge status: Home / Self Care | Payer: BC Managed Care – PPO | Source: Ambulatory Visit | Attending: Obstetrics & Gynecology | Admitting: Obstetrics & Gynecology

## 2013-09-28 DIAGNOSIS — M8580 Other specified disorders of bone density and structure, unspecified site: Secondary | ICD-10-CM

## 2013-10-03 ENCOUNTER — Encounter: Payer: Self-pay | Admitting: Obstetrics & Gynecology

## 2013-10-17 ENCOUNTER — Ambulatory Visit (INDEPENDENT_AMBULATORY_CARE_PROVIDER_SITE_OTHER): Payer: BC Managed Care – PPO | Admitting: Internal Medicine

## 2013-10-17 ENCOUNTER — Encounter: Payer: Self-pay | Admitting: Internal Medicine

## 2013-10-17 VITALS — BP 130/80 | Temp 98.5°F | Ht 64.0 in | Wt 220.0 lb

## 2013-10-17 DIAGNOSIS — Z9189 Other specified personal risk factors, not elsewhere classified: Secondary | ICD-10-CM | POA: Insufficient documentation

## 2013-10-17 DIAGNOSIS — M129 Arthropathy, unspecified: Secondary | ICD-10-CM

## 2013-10-17 DIAGNOSIS — L259 Unspecified contact dermatitis, unspecified cause: Secondary | ICD-10-CM

## 2013-10-17 DIAGNOSIS — Z872 Personal history of diseases of the skin and subcutaneous tissue: Secondary | ICD-10-CM

## 2013-10-17 DIAGNOSIS — L309 Dermatitis, unspecified: Secondary | ICD-10-CM

## 2013-10-17 MED ORDER — FLUOCINONIDE 0.05 % EX CREA: 1.0000 "application " | TOPICAL_CREAM | Freq: Two times a day (BID) | CUTANEOUS | Status: DC

## 2013-10-17 MED ORDER — VALACYCLOVIR HCL 1 G PO TABS: 1000.0000 mg | ORAL_TABLET | Freq: Three times a day (TID) | ORAL | Status: DC

## 2013-10-17 NOTE — Progress Notes (Signed)
Pre visit review using our clinic review tool, if applicable. No additional management support is needed unless otherwise documented below in the visit note.  Chief Complaint  Patient presents with  . Rash    Painful, itching and burning.    HPI: Patient comes in today for SDA for  new problem evaluation. Onset 4-5 days  Like big bites on legs and then on the back  and some burning and itching Right leg  Base of back with rash  In a line  whelt like. Some itching at the back of the head no known insect bites bedbugs et Ronney Asters. Asks if it could be shingles . Had a shot of Depo-Medrol to beginning of August per Dr. Loney Laurence low is to get appointment with Dr. Keturah Barre. soon. Dog on a lease  No exposures  ROS: See pertinent positives and negatives per HPI. Has been taking the liquid iron bothers her stomach every other day but is able to keep up and no active bleeding. She does have some bruising in her distal leg but no petechiae. She sustained a stress fracture right fifth metatarsal that she wasn't aware of.  Past Medical History  Diagnosis Date  . HYPERLIPIDEMIA 12/29/2006    takes Lovastatin nightly  . Right bundle branch block 02/09/2007  . IBS 04/28/2007  . MENOPAUSAL DISORDER 12/07/2007  . Rheumatoid arthritis(714.0) 12/29/2006  . SYNCOPE 12/07/2007  . WEIGHT GAIN 12/18/2008  . Palpitations 02/09/2007  . UNS ADVRS EFF OTH RX MEDICINAL\T\BIOLOGICAL SBSTNC 02/09/2007  . GASTRIC ULCER, ACUTE, HEMORRHAGE, HX OF 02/09/2007  . COLONIC POLYPS, HX OF 02/09/2007  . REDUCTION MAMMOPLASTY, HX OF 02/09/2007  . HYPERTENSION 12/29/2006    takes Enalapril daily  . Headache(784.0)     occasionally  . CHOLECYSTECTOMY, HX OF   . Fibromyalgia     doesn't require meds  . Joint pain   . Joint swelling   . Chronic back pain   . Bruises easily   . H/O hiatal hernia   . GERD (gastroesophageal reflux disease)     takes Nexium daily  . UTI (lower urinary tract infection)     hx of;saw urologist last yr  and he told her she was over reacting and not a big deal   . Anemia   . Hx of seasonal allergies     takes Claritin daily  . Insomnia     related to pain;takes Flexeril and Tylenol PM nightly  . S/P lumbar fusion 6 13 09/03/2011    L4 L5  posterior   . Cough, persistent 01/21/2012    poss from acei   . 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 Abnormal Pap smear     H/O  . Goiter   . Arthritis     rheumatoid and osteo    Family History  Problem Relation Age of Onset  . Diabetes Mother   . Colon cancer Mother     x2  . Arthritis Mother   . Hypertension Mother   . Stroke Father   . Heart disease Father   . Arthritis Brother   . Hypertension Brother   . Hyperlipidemia Brother   . Anesthesia problems Neg Hx   . Hypotension Neg Hx   . Malignant hyperthermia Neg Hx   . Pseudochol deficiency Neg Hx   . Other      DISH brother  . Endometrial cancer Mother   . Colon cancer Father   . Hypertension Maternal Grandmother   . Stroke Maternal Grandmother   .  Stroke Father   . Infertility Brother     History   Social History  . Marital Status: Widowed    Spouse Name: N/A    Number of Children: N/A  . Years of Education: N/A   Social History Main Topics  . Smoking status: Never Smoker   . Smokeless tobacco: Never Used  . Alcohol Use: Yes     Comment: very rare  . Drug Use: No  . Sexual Activity: No     Comment: TAH/BSO   Other Topics Concern  . 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 Acetaminophen (TYLENOL ARTHRITIS PAIN PO) Take 1,300 mg by mouth 2 (two) times daily as needed.   Marland Kitchen alosetron (LOTRONEX) 0.5 MG tablet Take 0.5 mg by mouth daily.  Marland Kitchen amLODipine (NORVASC) 5 MG tablet Take 1 tablet (5 mg total) by mouth daily.  . clobetasol ointment (TEMOVATE) 0.05 % Apply  topically 2 (two) times daily. As needed  Limit to  4 weeks at a time  . cyclobenzaprine (FLEXERIL) 5 MG tablet Take 2.5 mg by mouth at bedtime as needed.    . Diphenhydramine-APAP, sleep, (TYLENOL PM EXTRA STRENGTH PO) Take 2 tablets by mouth at bedtime as needed. For sleep  . esomeprazole (NEXIUM) 40 MG capsule Take 40 mg by mouth every morning before breakfast.    . estradiol (VIVELLE-DOT) 0.025 MG/24HR Place 1 patch onto the skin 2 (two) times a week.  . Ferrous Sulfate 5 MG/20ML LIQD Take by mouth.  Marland Kitchen HYDROcodone-acetaminophen (NORCO/VICODIN) 5-325 MG per tablet Take 1 tablet by mouth 3 (three) times daily as needed for moderate pain.   Marland Kitchen losartan (COZAAR) 100 MG tablet TAKE 1 TABLET EVERY DAY  . lovastatin (MEVACOR) 20 MG tablet TAKE 1 TABLET BY MOUTH EVERY DAY AT BEDTIME  . Multiple Vitamin (MULTIVITAMIN) tablet Take 1 tablet by mouth daily.    . predniSONE (DELTASONE) 10 MG tablet   . fluocinonide cream (LIDEX) 2.02 % Apply 1 application topically 2 (two) times daily.  . valACYclovir (VALTREX) 1000 MG tablet Take 1 tablet (1,000 mg total) by mouth 3 (three) times daily.    EXAM:  BP 130/80  Temp(Src) 98.5 F (36.9 C) (Oral)  Ht 5\' 4"  (1.626 m)  Wt 220 lb (99.791 kg)  BMI 37.74 kg/m2  LMP 03/02/1984  Body mass index is 37.74 kg/(m^2).  GENERAL: vitals reviewed and listed above, alert, oriented, appears well hydrated and in no acute distress HEENT: atraumatic, conjunctiva  clear, no obvious abnormalities on inspection of external nose and ears NECK: no obvious masses on inspection palpation  Examination of skin faded scabbed over.on left knee and lower leg behind both knees there sib redness area that are small and discrete without vesicles right lower back there is a linear a lot she read rash with some papules but no vesicles otherwise a few other scattered red blood bite looking lesions. Not psoriatic 1 lesions psoriatic on her left elbow PSYCH: pleasant and cooperative, no  obvious depression or anxiety  ASSESSMENT AND PLAN:  Discussed the following assessment and plan:  Dermatitis - atypical . for shingles but if progressess the linear   add valtrex  topical in the interim.   Hx of psoriasis - Doesn't look like a systemic psoriatic reaction  at this time. Localized and crops  Arthropathy, unspecified, site unspecified - recet stress fracture and  hx of pred steroid rx  over time She was exposed to shingles has been on steroids and has had the shingles vaccine in the past. If the linear rash is becoming more typical she can start Valtrex. Interested in seeing dr Ds opinion about  Dx and plan of rx  -Patient advised to return or notify health care team  if symptoms worsen ,persist or new concerns arise.  Patient Instructions  Rash acts like contact dermatitis.   Or bites    But the linear rash of progresses's consider atypical shingles .  Topical cortisone for now twice a day. i f progressing pain etc  Can begin the valtrex .  I dont think this is hives or a systemic reaction.   Standley Brooking. Panosh M.D.

## 2013-10-17 NOTE — Patient Instructions (Signed)
Rash acts like contact dermatitis.   Or bites    But the linear rash of progresses's consider atypical shingles .  Topical cortisone for now twice a day. i f progressing pain etc  Can begin the valtrex .  I dont think this is hives or a systemic reaction.

## 2013-10-20 ENCOUNTER — Ambulatory Visit (INDEPENDENT_AMBULATORY_CARE_PROVIDER_SITE_OTHER): Payer: BC Managed Care – PPO | Admitting: Podiatrist

## 2013-10-20 ENCOUNTER — Ambulatory Visit (INDEPENDENT_AMBULATORY_CARE_PROVIDER_SITE_OTHER): Payer: BC Managed Care – PPO

## 2013-10-20 ENCOUNTER — Encounter: Payer: Self-pay | Admitting: Podiatrist

## 2013-10-20 VITALS — BP 107/60 | HR 92 | Resp 18

## 2013-10-20 DIAGNOSIS — M8448XD Pathological fracture, other site, subsequent encounter for fracture with routine healing: Secondary | ICD-10-CM

## 2013-10-20 DIAGNOSIS — R52 Pain, unspecified: Secondary | ICD-10-CM

## 2013-10-20 DIAGNOSIS — M8430XD Stress fracture, unspecified site, subsequent encounter for fracture with routine healing: Secondary | ICD-10-CM

## 2013-10-20 DIAGNOSIS — M8430XA Stress fracture, unspecified site, initial encounter for fracture: Secondary | ICD-10-CM

## 2013-10-20 NOTE — Progress Notes (Signed)
Chief Complaint  Patient presents with  . Foot Problem    my right foot is still sore and tender and does swell some     HPI: Patient is 63 y.o. female who presents today for followup of fifth metatarsal base discomfort/stress fracture right foot. She states that the large boot is cumbersome and that she's having trouble wearing it with her arthritis. She also states that her foot isn't much better since I saw her last  Physical Exam  Neurovascular status is unchanged. Pain on palpation at the base and mid diaphyseal region of the fifth metatarsal is noted with pressure and palpation. Unchanged from the last visit. She also has calluses on bilateral heels which are painful and symptomatic.  X-rays show stress fracture at the base of the fifth metatarsal most easily seen on the lateral film.  Assessment: Stress fracture fifth metatarsal base right foot  Plan: Explained to the patient that the stress fracture would have decrease or very slow chance in healing if she is unwilling or unable to wear the boot. Discussed the importance of being off of her foot and offloading the bone so it can heal appropriately. I will see her back in 4 weeks and we will take another x-ray of the foot and see how she is doing at that point. I also debrided the calluses of her heels today as well.  She will continue to use the revitaderm at home.

## 2013-10-20 NOTE — Patient Instructions (Addendum)
Metatarsal Stress Fracture When too much stress is put on the foot, as in running and jumping sports, the center shaft of the bones of the forefoot is very susceptible to stress fractures (break in bone). This is because of repetitive stress on the bone. This injury is more common if osteoporosis is present or if inadequate running shoes are used. Rapid increase in running distances are often the cause. Running distances should be gradually increased to avoid this problem. Shoes should be used which adequately cushion the foot. Shoes should absorb the shocks of the activity.  DIAGNOSIS  Usually the diagnosis is made by history. The foot progressively becomes sorer with activities. X-rays may be negative (show no break) within the first 2 to 3 weeks of the beginning of pain. A later X-ray may show signs of healing bone (callus formation). A bone scan or MRI will usually make the diagnosis earlier. TREATMENT AND HOME CARE INSTRUCTIONS  Treatment may or may not include a cast, removable fracture boot, or walking shoe. Casts are used for short periods of time to prevent muscle atrophy (muscle wasting).  Activities should be stopped until further advised by your caregiver.  Wear shoes with adequate shock absorbing abilities.  Alternative exercise may be undertaken while waiting for healing. These may include bicycling and swimming, or as your caregiver suggests. If you do not have a cast or splint:  You may walk on your injured foot as tolerated or advised.  Do not put any weight on your injured foot for as long as directed by your caregiver. Slowly increase the amount of time you walk on the foot as the pain allows or as advised.  Use crutches until you can bear weight without pain. A gradual increase in weight bearing may help.  Apply ice to the injury for 15-20 minutes each hour while awake for the first 2 days. Put the ice in a plastic bag and place a towel between the bag of ice and your  skin.  Only take over-the-counter or prescription medicines for pain, discomfort, or fever as directed by your caregiver. SEEK IMMEDIATE MEDICAL CARE IF:   Pain is becoming worse rather than better, or if pain is uncontrolled with medications.  You have increased swelling or redness in the foot. MAKE SURE YOU:   Understand these instructions.  Will watch your condition.  Will get help right away if you are not doing well or get worse. Document Released: 02/14/2000 Document Revised: 05/11/2011 Document Reviewed: 12/13/2007 [facility] Patient Information 2015 [place], [place]. This information is not intended to replace advice given to you by your health care provider. Make sure you discuss any questions you have with your health care provider.   EVEN UP-- a device that will help even out your foot with your boot.  You will likely be able to find it on [email or web address]

## 2013-11-17 ENCOUNTER — Encounter: Payer: Self-pay | Admitting: Podiatrist

## 2013-11-17 ENCOUNTER — Ambulatory Visit (INDEPENDENT_AMBULATORY_CARE_PROVIDER_SITE_OTHER): Payer: BC Managed Care – PPO | Admitting: Podiatrist

## 2013-11-17 ENCOUNTER — Ambulatory Visit (INDEPENDENT_AMBULATORY_CARE_PROVIDER_SITE_OTHER): Payer: BC Managed Care – PPO

## 2013-11-17 ENCOUNTER — Encounter: Payer: Self-pay | Admitting: Internal Medicine

## 2013-11-17 VITALS — BP 154/80 | HR 73 | Resp 16

## 2013-11-17 DIAGNOSIS — M8430XG Stress fracture, unspecified site, subsequent encounter for fracture with delayed healing: Secondary | ICD-10-CM

## 2013-11-17 DIAGNOSIS — M8430XD Stress fracture, unspecified site, subsequent encounter for fracture with routine healing: Secondary | ICD-10-CM

## 2013-11-17 DIAGNOSIS — M8430XA Stress fracture, unspecified site, initial encounter for fracture: Secondary | ICD-10-CM

## 2013-11-17 NOTE — Patient Instructions (Signed)
Stress Fracture When too much stress is put on the foot, as may occur in running and jumping sports, the lengthy shafts of the bones of the forefoot become susceptible to breaking due to repetitive stress (stress fracture) because of thinness of these bone. A stress fracture is more common if osteoporosis is present or if inadequate athletic footwear is used. Shoes should be used which adequately support the sole of the foot to absorb the shocks of the activity participated in. Stress fractures are very common in competitive female runners who develop these small cracks on the surface of the bones in their legs and feet. The women most likely to suffer these injuries are those who restrict food intake and those who have irregular periods. Stress fractures usually start out as a minor discomfort in the foot or leg. The completion of fracture due to repetitive loading often occurs near the end of a long run. The pain may dissipate with rest. With the next exercise session, the pain may return earlier in the run. If an athlete notices that it hurts to touch just one spot on a bone and then stops running for a week, he or she may be tempted to return to running too soon. Often the pain is ignored in order to continue with high impact exercise. A stress fracture then develops. The athlete now has to avoid the hard pounding of running, but can ride a bike or swim for exercise once the pain has resolved with normal weight bearing until the fracture heals in 6-12 weeks. The most common sites for stress fractures are the bones in the front of the feet (metatarsals) and the long bone of the lower leg (tibia), but running can cause stress fractures anywhere in the lower extremities or pelvis. DIAGNOSIS  Usually the diagnosis is made by reviewing the patient's history. The bone involved progressively becomes more painful with activities. X-rays may show no break within the first 2-3 weeks that pain begins. A later X-ray may  show signs that the bone is healing. Having a bone scan or MRI usually makes an earlier diagnosis possible. HOME CARE INSTRUCTIONS  Treatment may include a cast or walking shoe.  High impact activities should be stopped until advised by your caregiver.  Wear shoes with adequate shock absorbing abilities and good support of the sole of the foot. This is especially important in the arch of the foot.  Alternative exercise may be undertaken while waiting for healing. This may include bicycling and swimming. If you do not have a cast or splint:  You may walk on your injured foot as tolerated or advised.  Do not put any weight on your injured foot until instructed. Slowly increase the amount of time you walk on the foot as the pain allows or as advised.  Use crutches until you can bear weight without pain. A gradual increase in weight bearing may help.  Apply ice to the injured area for the first 2 days after you have been treated or as directed by your caregiver.  Put ice in a plastic bag.  Place a towel between your skin and the bag.  Leave the ice on for 15-20 minutes at a time, every hour while you are awake.  Only take over-the-counter or prescription medicines for pain or discomfort as directed by your caregiver.  If your caregiver has given you a follow-up appointment, it is very important to keep that appointment. Not keeping the appointment could result in a chronic or permanent   injury, pain, and disability. SEEK IMMEDIATE MEDICAL CARE IF:   Pain is becoming worse rather than better.  Pain is uncontrolled with medicine.  You have increased swelling or redness in the foot.  The feeling in the foot or leg is diminished. MAKE SURE YOU:   Understand these instructions.  Will watch your condition.  Will get help right away if you are not doing well or get worse. Document Released: 05/09/2002 Document Revised: 06/13/2012 Document Reviewed: 10/03/2007 [employer] Patient  Information 2015 [employer]. This information is not intended to replace advice given to you by your health care provider. Make sure you discuss any questions you have with your health care provider.  

## 2013-11-17 NOTE — Progress Notes (Signed)
   Subjective:    Patient ID: Jade Cardenas, female    DOB: 1951/01/25, 63 y.o.   MRN: [id number]  HPI Comments: Pt states she feels she has been up on her feet more the last week without the air fracture walker, and the foot doesn't feel as good this week.  Patient relates she barely was able to wear the air fracture walker because it hurt her hip.  Instead she opted to wrap the foot in an ace wrap.  She relates overall it is improved.    Review of Systems     Objective:   Physical Exam  Neurovascular status intact and unchanged.  No swelling or pain is present over the fifth metatarsal base of the right foot.  xrays are done and reveal healed stress fracture.      Assessment & Plan:  Healed stress fracture  Plan: discussed returning the boot-- we will have to see if the boot is in good enough condition to return.  Told her to slowly wean up to increasing her activity.  She is not going to be compliant with a boot or restrictive shoewear so I told the patient to let her foot be her guide as to returning to activity.

## 2013-11-20 NOTE — Telephone Encounter (Signed)
Called and advised pt that Dr. Regis Bill is out of the office and this will be addressed on next week.  Pt verbalized understanding.  Pt states she comes in the office in October for labs and would like a TB skin at that time.  Pls advise on mychart message.  Thanks!

## 2013-12-07 ENCOUNTER — Other Ambulatory Visit (INDEPENDENT_AMBULATORY_CARE_PROVIDER_SITE_OTHER): Payer: BC Managed Care – PPO

## 2013-12-07 DIAGNOSIS — R946 Abnormal results of thyroid function studies: Secondary | ICD-10-CM

## 2013-12-07 DIAGNOSIS — E785 Hyperlipidemia, unspecified: Secondary | ICD-10-CM

## 2013-12-07 DIAGNOSIS — D649 Anemia, unspecified: Secondary | ICD-10-CM

## 2013-12-07 DIAGNOSIS — I10 Essential (primary) hypertension: Secondary | ICD-10-CM

## 2013-12-07 DIAGNOSIS — R7612 Nonspecific reaction to cell mediated immunity measurement of gamma interferon antigen response without active tuberculosis: Secondary | ICD-10-CM

## 2013-12-07 LAB — CBC WITH DIFFERENTIAL/PLATELET
Basophils Absolute: 0 10*3/uL (ref 0.0–0.1)
Basophils Relative: 0.7 % (ref 0.0–3.0)
Eosinophils Absolute: 0.1 10*3/uL (ref 0.0–0.7)
Eosinophils Relative: 1.3 % (ref 0.0–5.0)
HCT: 37.4 % (ref 36.0–46.0)
Hemoglobin: 12.3 g/dL (ref 12.0–15.0)
Lymphocytes Relative: 31.2 % (ref 12.0–46.0)
Lymphs Abs: 1.6 10*3/uL (ref 0.7–4.0)
MCHC: 32.9 g/dL (ref 30.0–36.0)
MCV: 89.4 fl (ref 78.0–100.0)
Monocytes Absolute: 0.3 10*3/uL (ref 0.1–1.0)
Monocytes Relative: 5.9 % (ref 3.0–12.0)
Neutro Abs: 3.2 10*3/uL (ref 1.4–7.7)
Neutrophils Relative %: 60.9 % (ref 43.0–77.0)
Platelets: 351 10*3/uL (ref 150.0–400.0)
RBC: 4.18 Mil/uL (ref 3.87–5.11)
RDW: 14.4 % (ref 11.5–15.5)
WBC: 5.2 10*3/uL (ref 4.0–10.5)

## 2013-12-07 LAB — BASIC METABOLIC PANEL
BUN: 16 mg/dL (ref 6–23)
CO2: 27 mEq/L (ref 19–32)
Calcium: 9.4 mg/dL (ref 8.4–10.5)
Chloride: 105 mEq/L (ref 96–112)
Creatinine, Ser: 0.7 mg/dL (ref 0.4–1.2)
GFR: 88.79 mL/min (ref >60.00)
Glucose, Bld: 101 mg/dL — ABNORMAL HIGH (ref 70–99)
Potassium: 4.1 mEq/L (ref 3.5–5.1)
Sodium: 141 mEq/L (ref 135–145)

## 2013-12-07 LAB — LIPID PANEL
Cholesterol: 195 mg/dL (ref 0–200)
HDL: 55.1 mg/dL (ref >39.00)
LDL Cholesterol: 106 mg/dL — ABNORMAL HIGH (ref 0–99)
NonHDL: 139.9
Total CHOL/HDL Ratio: 4
Triglycerides: 172 mg/dL — ABNORMAL HIGH (ref 0.0–149.0)
VLDL: 34.4 mg/dL (ref 0.0–40.0)

## 2013-12-07 LAB — IBC PANEL
Iron: 55 ug/dL (ref 42–145)
Saturation Ratios: 14.2 % — ABNORMAL LOW (ref 20.0–50.0)
Transferrin: 276 mg/dL (ref 212.0–360.0)

## 2013-12-07 LAB — FERRITIN: Ferritin: 22.2 ng/mL (ref 10.0–291.0)

## 2013-12-07 LAB — TSH: TSH: 0.37 u[IU]/mL (ref 0.35–4.50)

## 2013-12-11 ENCOUNTER — Encounter: Payer: Self-pay | Admitting: Internal Medicine

## 2013-12-11 LAB — QUANTIFERON TB GOLD ASSAY (BLOOD)
Mitogen value: 0.31 IU/mL
Quantiferon Nil Value: 0.02 IU/mL
Quantiferon Tb Ag Minus Nil Value: 0.01 IU/mL
TB Ag value: 0.03 IU/mL

## 2013-12-12 ENCOUNTER — Encounter: Payer: Self-pay | Admitting: Family Medicine

## 2013-12-14 ENCOUNTER — Encounter: Payer: Self-pay | Admitting: Internal Medicine

## 2013-12-14 ENCOUNTER — Ambulatory Visit (INDEPENDENT_AMBULATORY_CARE_PROVIDER_SITE_OTHER): Payer: BC Managed Care – PPO | Admitting: Internal Medicine

## 2013-12-14 VITALS — BP 128/70 | Temp 97.8°F | Ht 64.0 in | Wt 222.0 lb

## 2013-12-14 DIAGNOSIS — Z8781 Personal history of (healed) traumatic fracture: Secondary | ICD-10-CM

## 2013-12-14 DIAGNOSIS — Z9289 Personal history of other medical treatment: Secondary | ICD-10-CM

## 2013-12-14 DIAGNOSIS — M159 Polyosteoarthritis, unspecified: Secondary | ICD-10-CM

## 2013-12-14 DIAGNOSIS — E611 Iron deficiency: Secondary | ICD-10-CM

## 2013-12-14 DIAGNOSIS — M8949 Other hypertrophic osteoarthropathy, multiple sites: Secondary | ICD-10-CM

## 2013-12-14 DIAGNOSIS — E785 Hyperlipidemia, unspecified: Secondary | ICD-10-CM

## 2013-12-14 DIAGNOSIS — M150 Primary generalized (osteo)arthritis: Secondary | ICD-10-CM

## 2013-12-14 DIAGNOSIS — L4050 Arthropathic psoriasis, unspecified: Secondary | ICD-10-CM

## 2013-12-14 DIAGNOSIS — I10 Essential (primary) hypertension: Secondary | ICD-10-CM

## 2013-12-14 NOTE — Patient Instructions (Signed)
Iron is stable  Take when you can  You are not anemic today.  PPD today .  Can come to Saturday clinic to read this.  Then will send  Information to dr D ... Take vit d 1000 iu per day  Consider other therapy for bone health .  ROV depending on two  you are  doing or 4-6 months .

## 2013-12-14 NOTE — Progress Notes (Signed)
Pre visit review using our clinic review tool, if applicable. No additional management support is needed unless otherwise documented below in the visit note.  Chief Complaint  Patient presents with  . Follow-up    HPI: Jade Cardenas  comes in today for follow up of  multiple medical problems.  Comes in for followup after evaluation by rheumatology again. Working diagnosis is now psoriatic arthritis plus osteoarthritis. scartchy throat for a day but no fever  Tb test omdeterminjat needs ppd  Before med be given such as methotrexate which is recommended and perhaps Remicade  Stress fracture  on her foot in the interim  Had sick in bed diarrhea and nausea   couldnt take medicine prescribed it but helped the arthritis  Says she has   To start mtx folic acid and remicade .,   embrel and mu=mira  Caused neuropathy .  Neck  A different issue than the other arthritis .  She presents lab work done by Dr. Lambert Keto sure which is normal except for a CK of 258 and a sedimentation rate of 38. Her quantity her on is indeterminate antibodies negative creatinine 0.79 she also presents with routine screening results at the [facility] which is all presented is normal except for a high body mass index.  Is also been to the dermatologist who confirms her rashes psoriasis. ROS: See pertinent positives and negatives per HPI. No current cough or syncope  Past Medical History  Diagnosis Date  . HYPERLIPIDEMIA 12/29/2006    takes Lovastatin nightly  . Right bundle branch block 02/09/2007  . IBS 04/28/2007  . MENOPAUSAL DISORDER 12/07/2007  . Rheumatoid arthritis(714.0) 12/29/2006  . SYNCOPE 12/07/2007  . WEIGHT GAIN 12/18/2008  . Palpitations 02/09/2007  . UNS ADVRS EFF OTH RX MEDICINAL\T\BIOLOGICAL SBSTNC 02/09/2007  . GASTRIC ULCER, ACUTE, HEMORRHAGE, HX OF 02/09/2007  . COLONIC POLYPS, HX OF 02/09/2007  . REDUCTION MAMMOPLASTY, HX OF 02/09/2007  . HYPERTENSION 12/29/2006    takes Enalapril daily    . Headache(784.0)     occasionally  . CHOLECYSTECTOMY, HX OF   . Fibromyalgia     doesn't require meds  . Joint pain   . Joint swelling   . Chronic back pain   . Bruises easily   . H/O hiatal hernia   . GERD (gastroesophageal reflux disease)     takes Nexium daily  . UTI (lower urinary tract infection)     hx of;saw urologist last yr and he told her she was over reacting and not a big deal   . Anemia     iron def  . Hx of seasonal allergies     takes Claritin daily  . Insomnia     related to pain;takes Flexeril and Tylenol PM nightly  . S/P lumbar fusion 6 13 09/03/2011    L4 L5  posterior   . Cough, persistent 01/21/2012    poss from acei   . 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 Abnormal Pap smear     H/O  . Goiter   . Arthritis     rheumatoid and osteo now flt to be psoriateic     Family History  Problem Relation Age of Onset  . Diabetes Mother   . Colon cancer Mother     x2  . Arthritis Mother   . Hypertension Mother   . Stroke Father   . Heart disease Father   . Arthritis Brother   . Hypertension Brother   . Hyperlipidemia Brother   .  Anesthesia problems Neg Hx   . Hypotension Neg Hx   . Malignant hyperthermia Neg Hx   . Pseudochol deficiency Neg Hx   . Other      DISH brother  . Endometrial cancer Mother   . Colon cancer Father   . Hypertension Maternal Grandmother   . Stroke Maternal Grandmother   . Stroke Father   . Infertility Brother     History   Social History  . Marital Status: Widowed    Spouse Name: N/A    Number of Children: N/A  . Years of Education: N/A   Social History Main Topics  . Smoking status: Never Smoker   . Smokeless tobacco: Never Used  . Alcohol Use: Yes     Comment: very rare  . Drug Use: No  . Sexual Activity: No     Comment: TAH/BSO   Other Topics Concern  . 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 Acetaminophen (TYLENOL ARTHRITIS PAIN PO) Take 1,300 mg by mouth 2 (two) times daily as needed.   Marland Kitchen alosetron (LOTRONEX) 0.5 MG tablet Take 0.5 mg by mouth daily.  Marland Kitchen amLODipine (NORVASC) 5 MG tablet Take 1 tablet (5 mg total) by mouth daily.  . cyclobenzaprine (FLEXERIL) 5 MG tablet Take 2.5 mg by mouth at bedtime as needed.    . Diphenhydramine-APAP, sleep, (TYLENOL PM EXTRA STRENGTH PO) Take 2 tablets by mouth at bedtime as needed. For sleep  . esomeprazole (NEXIUM) 40 MG capsule Take 40 mg by mouth every morning before breakfast.    . estradiol (VIVELLE-DOT) 0.025 MG/24HR Place 1 patch onto the skin 2 (two) times a week.  Marland Kitchen HYDROcodone-acetaminophen (NORCO/VICODIN) 5-325 MG per tablet Take 1 tablet by mouth 3 (three) times daily as needed for moderate pain.   Marland Kitchen losartan (COZAAR) 100 MG tablet TAKE 1 TABLET EVERY DAY  . lovastatin (MEVACOR) 20 MG tablet TAKE 1 TABLET BY MOUTH EVERY DAY AT BEDTIME  . Multiple Vitamin (MULTIVITAMIN) tablet Take 1 tablet by mouth daily.    . [DISCONTINUED] Apremilast (OTEZLA PO) Take by mouth.  . [DISCONTINUED] clobetasol ointment (TEMOVATE) 0.05 % Apply topically 2 (two) times daily. As needed  Limit to  4 weeks at a time  . [DISCONTINUED] Ferrous Sulfate 5 MG/20ML LIQD Take by mouth.  . [DISCONTINUED] fluocinonide cream (LIDEX) 4.01 % Apply 1 application topically 2 (two) times daily.  . [DISCONTINUED] predniSONE (DELTASONE) 10 MG tablet   . [DISCONTINUED] valACYclovir (VALTREX) 1000 MG tablet Take 1 tablet (1,000 mg total) by mouth 3 (three) times daily.    EXAM:  BP 128/70  Temp(Src) 97.8 F (36.6 C) (Oral)  Ht 5\' 4"  (1.626 m)  Wt 222 lb (100.699 kg)  BMI 38.09 kg/m2  LMP 03/02/1984  Body mass index is 38.09 kg/(m^2).  GENERAL: vitals reviewed and listed above, alert, oriented, appears well hydrated and in no acute distress HEENT: atraumatic,  conjunctiva  clear, no obvious abnormalities on inspection of external nose and ears OP : no lesion edema or exudate good airway NECK: no obvious masses on inspection palpation  LUNGS: clear to auscultation bilaterally, no wheezes, rales or rhonchi, good air movement CV: HRRR, no clubbing cyanosis or  peripheral edema nl cap refill  MS: moves all extremities  PSYCH:  pleasant and cooperative, no obvious depression or anxiety Lab Results  Component Value Date   WBC 5.2 12/07/2013   HGB 12.3 12/07/2013   HCT 37.4 12/07/2013   PLT 351.0 12/07/2013   GLUCOSE 101* 12/07/2013   CHOL 195 12/07/2013   TRIG 172.0* 12/07/2013   HDL 55.10 12/07/2013   LDLDIRECT 114.0 04/02/2010   LDLCALC 106* 12/07/2013   ALT 18 12/02/2012   AST 20 12/02/2012   NA 141 12/07/2013   K 4.1 12/07/2013   CL 105 12/07/2013   CREATININE 0.7 12/07/2013   BUN 16 12/07/2013   CO2 27 12/07/2013   TSH 0.37 12/07/2013    ASSESSMENT AND PLAN:  Discussed the following assessment and plan:  Iron deficiency - Improved no longer anemic but still slightly iron deficient take iron as tolerated  Hyperlipidemia  Essential hypertension  Psoriatic arthritis - Working diagnosis indeterminate TB gold do PPD today recheck Saturday for reading  Primary osteoarthritis involving multiple joints  History of foot fracture - need to address bone health and future take vitamin D  Discussed immunizations can get the Pneumovax 23 before Remicade prefer to be done a year after the Prevnar. -Patient advised to return or notify health care team  if symptoms worsen ,persist or new concerns arise.  Patient Instructions  Iron is stable  Take when you can  You are not anemic today.  PPD today .  Can come to Saturday clinic to read this.  Then will send  Information to dr D ... Take vit d 1000 iu per day  Consider other therapy for bone health .  ROV depending on two  you are  doing or 4-6 months .    Standley Brooking. Hayli Milligan M.D.

## 2013-12-15 ENCOUNTER — Encounter: Payer: Self-pay | Admitting: Internal Medicine

## 2013-12-15 DIAGNOSIS — M170 Bilateral primary osteoarthritis of knee: Secondary | ICD-10-CM | POA: Insufficient documentation

## 2013-12-15 DIAGNOSIS — L4050 Arthropathic psoriasis, unspecified: Secondary | ICD-10-CM | POA: Insufficient documentation

## 2013-12-15 DIAGNOSIS — E611 Iron deficiency: Secondary | ICD-10-CM | POA: Insufficient documentation

## 2013-12-15 DIAGNOSIS — Z8781 Personal history of (healed) traumatic fracture: Secondary | ICD-10-CM | POA: Insufficient documentation

## 2013-12-16 LAB — TB SKIN TEST
Induration: 0 mm
TB Skin Test: NEGATIVE

## 2013-12-16 NOTE — Addendum Note (Signed)
Addended by: Colleen Can on: 12/16/2013 10:08 AM   Modules accepted: Orders

## 2013-12-18 ENCOUNTER — Encounter: Payer: Self-pay | Admitting: Internal Medicine

## 2014-01-02 ENCOUNTER — Other Ambulatory Visit: Payer: Self-pay | Admitting: Internal Medicine

## 2014-01-02 NOTE — Telephone Encounter (Signed)
Sent to the pharmacy by e-scribe. 

## 2014-02-06 ENCOUNTER — Other Ambulatory Visit: Payer: Self-pay | Admitting: Family Medicine

## 2014-02-21 ENCOUNTER — Other Ambulatory Visit: Payer: Self-pay | Admitting: Internal Medicine

## 2014-02-22 ENCOUNTER — Other Ambulatory Visit: Payer: Self-pay | Admitting: Internal Medicine

## 2014-02-28 ENCOUNTER — Encounter: Payer: Self-pay | Admitting: Internal Medicine

## 2014-02-28 ENCOUNTER — Ambulatory Visit (INDEPENDENT_AMBULATORY_CARE_PROVIDER_SITE_OTHER): Payer: BC Managed Care – PPO | Admitting: Internal Medicine

## 2014-02-28 VITALS — BP 140/80 | Temp 97.2°F | Ht 64.0 in | Wt 218.5 lb

## 2014-02-28 DIAGNOSIS — Z23 Encounter for immunization: Secondary | ICD-10-CM

## 2014-02-28 DIAGNOSIS — R197 Diarrhea, unspecified: Secondary | ICD-10-CM

## 2014-02-28 DIAGNOSIS — L4050 Arthropathic psoriasis, unspecified: Secondary | ICD-10-CM

## 2014-02-28 DIAGNOSIS — K589 Irritable bowel syndrome without diarrhea: Secondary | ICD-10-CM

## 2014-02-28 DIAGNOSIS — D899 Disorder involving the immune mechanism, unspecified: Secondary | ICD-10-CM

## 2014-02-28 DIAGNOSIS — D849 Immunodeficiency, unspecified: Secondary | ICD-10-CM

## 2014-02-28 NOTE — Progress Notes (Signed)
Pre visit review using our clinic review tool, if applicable. No additional management support is needed unless otherwise documented below in the visit note.  Chief Complaint  Patient presents with  . Follow-up    HPI: Jade Cardenas 62 y.o. with HT RA  DJD hx of iron deficiency  hyperlipidemia FM seasonal allergies and remote hx of gastric ulcer currently under rx per dr D     Began   Stomach  Sx  4 am  Diarrhea bad   Watery  No blood  abd cramps no fever.  Ate tea and toast.  Now  Eating more regular . Bland stuff    Not taking lotronex to get out of house.   Achy  Muscle aches .  simponi   Some help   Inc mtx   20mg   8 pills  per week.   Taking 2 q o d .     Needs pneumovax per rheum cause of treatments  ROS: See pertinent positives and negatives per HPI. No current cp sob blood fever chills uti sx   Past Medical History  Diagnosis Date  . HYPERLIPIDEMIA 12/29/2006    takes Lovastatin nightly  . Right bundle branch block 02/09/2007  . IBS 04/28/2007  . MENOPAUSAL DISORDER 12/07/2007  . Rheumatoid arthritis(714.0) 12/29/2006  . SYNCOPE 12/07/2007  . WEIGHT GAIN 12/18/2008  . Palpitations 02/09/2007  . UNS ADVRS EFF OTH RX MEDICINAL\T\BIOLOGICAL SBSTNC 02/09/2007  . GASTRIC ULCER, ACUTE, HEMORRHAGE, HX OF 02/09/2007  . COLONIC POLYPS, HX OF 02/09/2007  . REDUCTION MAMMOPLASTY, HX OF 02/09/2007  . HYPERTENSION 12/29/2006    takes Enalapril daily  . Headache(784.0)     occasionally  . CHOLECYSTECTOMY, HX OF   . Fibromyalgia     doesn't require meds  . Joint pain   . Joint swelling   . Chronic back pain   . Bruises easily   . H/O hiatal hernia   . GERD (gastroesophageal reflux disease)     takes Nexium daily  . UTI (lower urinary tract infection)     hx of;saw urologist last yr and he told her she was over reacting and not a big deal   . Anemia     iron def  . Hx of seasonal allergies     takes Claritin daily  . Insomnia     related to pain;takes Flexeril and  Tylenol PM nightly  . S/P lumbar fusion 6 13 09/03/2011    L4 L5  posterior   . Cough, persistent 01/21/2012    poss from acei   . 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 Abnormal Pap smear     H/O  . Goiter   . Arthritis     rheumatoid and osteo now flt to be psoriateic     Family History  Problem Relation Age of Onset  . Diabetes Mother   . Colon cancer Mother     x2  . Arthritis Mother   . Hypertension Mother   . Stroke Father   . Heart disease Father   . Arthritis Brother   . Hypertension Brother   . Hyperlipidemia Brother   . Anesthesia problems Neg Hx   . Hypotension Neg Hx   . Malignant hyperthermia Neg Hx   . Pseudochol deficiency Neg Hx   . Other      DISH brother  . Endometrial cancer Mother   . Colon cancer Father   . Hypertension Maternal Grandmother   . Stroke Maternal Grandmother   .  Stroke Father   . Infertility Brother     History   Social History  . Marital Status: Widowed    Spouse Name: N/A    Number of Children: N/A  . Years of Education: N/A   Social History Main Topics  . Smoking status: Never Smoker   . Smokeless tobacco: Never Used  . Alcohol Use: Yes     Comment: very rare  . Drug Use: No  . Sexual Activity: No     Comment: TAH/BSO   Other Topics Concern  . 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 Acetaminophen (TYLENOL ARTHRITIS PAIN PO) Take 1,300 mg by mouth 2 (two) times daily as needed.   Marland Kitchen alosetron (LOTRONEX) 0.5 MG tablet Take 0.5 mg by mouth daily.  Marland Kitchen amLODipine (NORVASC) 5 MG tablet Take 1 tablet (5 mg total) by mouth daily.  . cyclobenzaprine (FLEXERIL) 5 MG tablet Take 2.5 mg by mouth at bedtime as needed.    . Diphenhydramine-APAP, sleep, (TYLENOL PM EXTRA STRENGTH PO) Take 2 tablets by mouth at bedtime as needed.  For sleep  . esomeprazole (NEXIUM) 40 MG capsule Take 40 mg by mouth every morning before breakfast.    . folic acid (FOLVITE) 1 MG tablet Take 2 mg by mouth daily.   Marland Kitchen HYDROcodone-acetaminophen (NORCO/VICODIN) 5-325 MG per tablet Take 1 tablet by mouth at bedtime.   Marland Kitchen losartan (COZAAR) 100 MG tablet TAKE 1 TABLET EVERY DAY  . lovastatin (MEVACOR) 20 MG tablet TAKE 1 TABLET BY MOUTH EVERY DAY AT BEDTIME  . methotrexate (RHEUMATREX) 2.5 MG tablet 8 tablets po weekly  . Multiple Vitamin (MULTIVITAMIN) tablet Take 1 tablet by mouth daily.    Marland Kitchen SIMPONI 50 MG/0.5ML SOAJ   . [DISCONTINUED] estradiol (VIVELLE-DOT) 0.025 MG/24HR Place 1 patch onto the skin 2 (two) times a week.  . [DISCONTINUED] losartan (COZAAR) 100 MG tablet TAKE 1 TABLET EVERY DAY    EXAM:  BP 140/80 mmHg  Temp(Src) 97.2 F (36.2 C) (Oral)  Ht 5\' 4"  (1.626 m)  Wt 218 lb 8 oz (99.111 kg)  BMI 37.49 kg/m2  LMP 03/02/1984  Body mass index is 37.49 kg/(m^2).  GENERAL: vitals reviewed and listed above, alert, oriented, appears well hydrated and in no acute distress non toxic nl skin color adn cap refill  HEENT: atraumatic, conjunctiva  clear, no obvious abnormalities on inspection of external nose and ears OP : no lesion edema or exudate  NECK: no obvious masses on inspection palpation  LUNGS: clear to auscultation bilaterally, no wheezes, rales or rhonchi, good air movement CV: HRRR, no clubbing cyanosis or  peripheral edema nl cap refill  Abdomen:  Sof,t normal bowel sounds without hepatosplenomegaly, no guarding rebound or masses no CVA tenderness PSYCH: pleasant and cooperative, no obvious depression or anxiety  ASSESSMENT AND PLAN:  Discussed the following assessment and plan:  Diarrhea - doesnt sound med related  viral vs  ibs  getting better  close observation  Irritable bowel syndrome (IBS)  Immunosuppressed status - meds mtx and simponi   .  Need for 23-polyvalent pneumococcal polysaccharide vaccine - Plan:  Pneumococcal polysaccharide vaccine 23-valent greater than or equal to 2yo subcutaneous/IM Get pneum 23  No obv alarm features but high risk situation -Patient advised to return  or notify health care team  if symptoms worsen ,persist or new concerns arise.  Patient Instructions  This acts like a stomach virus  irritable bowel .  Advise  hold the mtx for now  until ok and then ask Dr D . about dosing.  Will send a note to dr D if she has other advice .        Standley Brooking. Panosh M.D.  Total visit 67mins > 50% spent counseling and coordinating care

## 2014-02-28 NOTE — Patient Instructions (Signed)
This acts like a stomach virus  irritable bowel .  Advise  hold the mtx for now  until ok and then ask Dr D . about dosing.  Will send a note to dr D if she has other advice .

## 2014-03-01 DIAGNOSIS — K589 Irritable bowel syndrome without diarrhea: Secondary | ICD-10-CM | POA: Insufficient documentation

## 2014-03-01 DIAGNOSIS — R197 Diarrhea, unspecified: Secondary | ICD-10-CM | POA: Insufficient documentation

## 2014-03-18 ENCOUNTER — Other Ambulatory Visit: Payer: Self-pay | Admitting: Internal Medicine

## 2014-03-19 NOTE — Telephone Encounter (Signed)
Sent to the pharmacy by e-scribe. 

## 2014-05-21 ENCOUNTER — Other Ambulatory Visit: Payer: Self-pay

## 2014-05-21 DIAGNOSIS — Z1231 Encounter for screening mammogram for malignant neoplasm of breast: Secondary | ICD-10-CM

## 2014-05-24 ENCOUNTER — Ambulatory Visit (INDEPENDENT_AMBULATORY_CARE_PROVIDER_SITE_OTHER): Payer: BLUE CROSS/BLUE SHIELD | Admitting: Internal Medicine

## 2014-05-24 ENCOUNTER — Encounter: Payer: Self-pay | Admitting: Internal Medicine

## 2014-05-24 VITALS — BP 140/80 | Temp 98.2°F | Ht 64.0 in | Wt 218.5 lb

## 2014-05-24 DIAGNOSIS — K219 Gastro-esophageal reflux disease without esophagitis: Secondary | ICD-10-CM

## 2014-05-24 DIAGNOSIS — D508 Other iron deficiency anemias: Secondary | ICD-10-CM | POA: Diagnosis not present

## 2014-05-24 DIAGNOSIS — R5383 Other fatigue: Secondary | ICD-10-CM | POA: Diagnosis not present

## 2014-05-24 DIAGNOSIS — L4050 Arthropathic psoriasis, unspecified: Secondary | ICD-10-CM

## 2014-05-24 DIAGNOSIS — Z79899 Other long term (current) drug therapy: Secondary | ICD-10-CM | POA: Diagnosis not present

## 2014-05-24 DIAGNOSIS — E611 Iron deficiency: Secondary | ICD-10-CM

## 2014-05-24 DIAGNOSIS — Z872 Personal history of diseases of the skin and subcutaneous tissue: Secondary | ICD-10-CM

## 2014-05-24 DIAGNOSIS — Z8719 Personal history of other diseases of the digestive system: Secondary | ICD-10-CM | POA: Insufficient documentation

## 2014-05-24 LAB — COMPREHENSIVE METABOLIC PANEL
ALT: 24 U/L (ref 0–35)
AST: 22 U/L (ref 0–37)
Albumin: 4.4 g/dL (ref 3.5–5.2)
Alkaline Phosphatase: 90 U/L (ref 39–117)
BUN: 18 mg/dL (ref 6–23)
CO2: 29 mEq/L (ref 19–32)
Calcium: 9.9 mg/dL (ref 8.4–10.5)
Chloride: 104 mEq/L (ref 96–112)
Creatinine, Ser: 0.71 mg/dL (ref 0.40–1.20)
GFR: 88.66 mL/min (ref >60.00)
Glucose, Bld: 97 mg/dL (ref 70–99)
Potassium: 5.4 mEq/L — ABNORMAL HIGH (ref 3.5–5.1)
Sodium: 140 mEq/L (ref 135–145)
Total Bilirubin: 0.5 mg/dL (ref 0.2–1.2)
Total Protein: 8 g/dL (ref 6.0–8.3)

## 2014-05-24 LAB — CBC WITH DIFFERENTIAL/PLATELET
Basophils Absolute: 0 10*3/uL (ref 0.0–0.1)
Basophils Relative: 0.8 % (ref 0.0–3.0)
Eosinophils Absolute: 0.1 10*3/uL (ref 0.0–0.7)
Eosinophils Relative: 1.3 % (ref 0.0–5.0)
HCT: 35.8 % — ABNORMAL LOW (ref 36.0–46.0)
Hemoglobin: 12 g/dL (ref 12.0–15.0)
Lymphocytes Relative: 30.7 % (ref 12.0–46.0)
Lymphs Abs: 1.6 10*3/uL (ref 0.7–4.0)
MCHC: 33.4 g/dL (ref 30.0–36.0)
MCV: 88 fl (ref 78.0–100.0)
Monocytes Absolute: 0.4 10*3/uL (ref 0.1–1.0)
Monocytes Relative: 7.4 % (ref 3.0–12.0)
Neutro Abs: 3.2 10*3/uL (ref 1.4–7.7)
Neutrophils Relative %: 59.8 % (ref 43.0–77.0)
Platelets: 343 10*3/uL (ref 150.0–400.0)
RBC: 4.07 Mil/uL (ref 3.87–5.11)
RDW: 14 % (ref 11.5–15.5)
WBC: 5.3 10*3/uL (ref 4.0–10.5)

## 2014-05-24 LAB — IBC PANEL
Iron: 72 ug/dL (ref 42–145)
Saturation Ratios: 15.8 % — ABNORMAL LOW (ref 20.0–50.0)
Transferrin: 326 mg/dL (ref 212.0–360.0)

## 2014-05-24 LAB — FERRITIN: Ferritin: 11 ng/mL (ref 10.0–291.0)

## 2014-05-24 MED ORDER — ESOMEPRAZOLE MAGNESIUM 40 MG PO CPDR: 40.0000 mg | DELAYED_RELEASE_CAPSULE | Freq: Every day | ORAL | Status: DC

## 2014-05-24 MED ORDER — CLOBETASOL PROPIONATE 0.05 % EX OINT: 1.0000 "application " | TOPICAL_OINTMENT | Freq: Two times a day (BID) | CUTANEOUS | Status: DC

## 2014-05-24 NOTE — Progress Notes (Signed)
Pre visit review using our clinic review tool, if applicable. No additional management support is needed unless otherwise documented below in the visit note.  Chief Complaint  Patient presents with  . Follow-up    anemia other issues     HPI: Jade Cardenas  comes in today for follow up of  multiple medical problems.   On simponi monitoring labs   cmp and cbc diff . Tired  No fver   No bleeding ? If anemic again.  Itchy rash behind knee for a week . Asks for rx clobetasol ointment to use as needed rx for nexium has been getting otc as per dr Jade Cardenas but would like  rx for cost  remot hs of ulcers    ROS: See pertinent positives and negatives per HPI. No cough cp sob left side rib area sore  No injury  Working some although retired   Past Medical History  Diagnosis Date  . HYPERLIPIDEMIA 12/29/2006    takes Lovastatin nightly  . Right bundle branch block 02/09/2007  . IBS 04/28/2007  . MENOPAUSAL DISORDER 12/07/2007  . Rheumatoid arthritis(714.0) 12/29/2006  . SYNCOPE 12/07/2007  . WEIGHT GAIN 12/18/2008  . Palpitations 02/09/2007  . UNS ADVRS EFF OTH RX MEDICINAL\T\BIOLOGICAL SBSTNC 02/09/2007  . GASTRIC ULCER, ACUTE, HEMORRHAGE, HX OF 02/09/2007  . COLONIC POLYPS, HX OF 02/09/2007  . REDUCTION MAMMOPLASTY, HX OF 02/09/2007  . HYPERTENSION 12/29/2006    takes Enalapril daily  . Headache(784.0)     occasionally  . CHOLECYSTECTOMY, HX OF   . Fibromyalgia     doesn't require meds  . Joint pain   . Joint swelling   . Chronic back pain   . Bruises easily   . H/O hiatal hernia   . GERD (gastroesophageal reflux disease)     takes Nexium daily  . UTI (lower urinary tract infection)     hx of;saw urologist last yr and he told her she was over reacting and not a big deal   . Anemia     iron def  . Hx of seasonal allergies     takes Claritin daily  . Insomnia     related to pain;takes Flexeril and Tylenol PM nightly  . S/P lumbar fusion 6 13 09/03/2011    L4 L5  posterior    . Cough, persistent 01/21/2012    poss from acei   . 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 Abnormal Pap smear     H/O  . Goiter   . Arthritis     rheumatoid and osteo now flt to be psoriateic     Family History  Problem Relation Age of Onset  . Diabetes Mother   . Colon cancer Mother     x2  . Arthritis Mother   . Hypertension Mother   . Stroke Father   . Heart disease Father   . Arthritis Brother   . Hypertension Brother   . Hyperlipidemia Brother   . Anesthesia problems Neg Hx   . Hypotension Neg Hx   . Malignant hyperthermia Neg Hx   . Pseudochol deficiency Neg Hx   . Other      DISH brother  . Endometrial cancer Mother   . Colon cancer Father   . Hypertension Maternal Grandmother   . Stroke Maternal Grandmother   . Stroke Father   . Infertility Brother     History   Social History  . Marital Status: Widowed    Spouse Name: N/A  .  Number of Children: N/A  . Years of Education: N/A   Social History Main Topics  . Smoking status: Never Smoker   . Smokeless tobacco: Never Used  . Alcohol Use: Yes     Comment: very rare  . Drug Use: No  . Sexual Activity: No     Comment: TAH/BSO   Other Topics Concern  . 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 Acetaminophen (TYLENOL ARTHRITIS PAIN PO) Take 1,300 mg by mouth 2 (two) times daily as needed.   Marland Kitchen alosetron (LOTRONEX) 0.5 MG tablet Take 0.5 mg by mouth daily.  Marland Kitchen amLODipine (NORVASC) 5 MG tablet TAKE 1 TABLET BY MOUTH ONCE DAILY  . cyclobenzaprine (FLEXERIL) 5 MG tablet Take 2.5 mg by mouth at bedtime as needed.    . Diphenhydramine-APAP, sleep, (TYLENOL PM EXTRA STRENGTH PO) Take 2 tablets by mouth at bedtime as needed. For sleep  . esomeprazole (NEXIUM) 40 MG capsule Take 1 capsule (40 mg total) by  mouth daily before breakfast.  . folic acid (FOLVITE) 1 MG tablet Take 2 mg by mouth daily.   Marland Kitchen HYDROcodone-acetaminophen (NORCO/VICODIN) 5-325 MG per tablet Take 1 tablet by mouth at bedtime.   Marland Kitchen leflunomide (ARAVA) 10 MG tablet Take 10 mg by mouth daily.  Marland Kitchen losartan (COZAAR) 100 MG tablet TAKE 1 TABLET EVERY DAY  . lovastatin (MEVACOR) 20 MG tablet TAKE 1 TABLET BY MOUTH EVERY DAY AT BEDTIME  . Multiple Vitamin (MULTIVITAMIN) tablet Take 1 tablet by mouth daily.    Marland Kitchen olopatadine (PATANOL) 0.1 % ophthalmic solution Place 2 drops into both eyes 2 (two) times daily.  Marland Kitchen SIMPONI 50 MG/0.5ML SOAJ   . [DISCONTINUED] esomeprazole (NEXIUM) 40 MG capsule Take 40 mg by mouth every morning before breakfast.    . clobetasol ointment (TEMOVATE) 1.02 % Apply 1 application topically 2 (two) times daily. Limit to 4 weeks at a time.  . [DISCONTINUED] methotrexate (RHEUMATREX) 2.5 MG tablet 8 tablets po weekly    EXAM:  BP 140/80 mmHg  Temp(Src) 98.2 F (36.8 C) (Oral)  Ht 5\' 4"  (1.626 m)  Wt 218 lb 8 oz (99.111 kg)  BMI 37.49 kg/m2  LMP 03/02/1984  Body mass index is 37.49 kg/(m^2).  GENERAL: vitals reviewed and listed above, alert, oriented, appears well hydrated and in no acute distress HEENT: atraumatic, conjunctiva  clear, no obvious abnormalities on inspection of external nose and ears NECK: no obvious masses on inspection palpation  LUNGS: clear to auscultation bilaterally, no wheezes, rales or rhonchi, good air movement  No abnormal chest wass pints to latera rib no crepitus and no rub CV: HRRR, no clubbing cyanosis  nl cap refill  Skin behin d left knee papule pink no  PSYCH: pleasant and cooperative, no obvious depression or anxiety Lab Results  Component Value Date   WBC 5.2 12/07/2013   HGB 12.3 12/07/2013   HCT 37.4 12/07/2013   PLT 351.0 12/07/2013   GLUCOSE 101* 12/07/2013   CHOL 195 12/07/2013   TRIG 172.0* 12/07/2013   HDL 55.10 12/07/2013   LDLDIRECT 114.0 04/02/2010    LDLCALC 106* 12/07/2013   ALT 18 12/02/2012   AST 20 12/02/2012   NA 141 12/07/2013   K 4.1 12/07/2013   CL 105 12/07/2013   CREATININE  0.7 12/07/2013   BUN 16 12/07/2013   CO2 27 12/07/2013   TSH 0.37 12/07/2013   BP Readings from Last 3 Encounters:  05/24/14 140/80  02/28/14 140/80  12/14/13 128/70   Wt Readings from Last 3 Encounters:  05/24/14 218 lb 8 oz (99.111 kg)  02/28/14 218 lb 8 oz (99.111 kg)  12/14/13 222 lb (100.699 kg)    ASSESSMENT AND PLAN:  Discussed the following assessment and plan:  Other iron deficiency anemias - Plan: CBC with Differential/Platelet, Comprehensive metabolic panel, IBC panel, Ferritin  High risk medication use - Plan: CBC with Differential/Platelet, Comprehensive metabolic panel, IBC panel, Ferritin  Drug therapy - Plan: CBC with Differential/Platelet, Comprehensive metabolic panel, IBC panel, Ferritin  Other fatigue - Plan: CBC with Differential/Platelet, Comprehensive metabolic panel, IBC panel, Ferritin  Hx of psoriasis  Psoriatic arthritis  Iron deficiency  Gastroesophageal reflux disease, esophagitis presence not specified  H/O hiatal hernia Lab  cmp and cbc diff  iron studies  Send results to dr Angelena Sole to order q 2 months for  Monitoring and send to her office. Disc risk benefot of ppi  Ok to rx today. Has high risk hx  Steroid topical intermittent use only  -Patient advised to return or notify health care team  if symptoms worsen ,persist or new concerns arise.  There are no Patient Instructions on file for this visit.   Standley Brooking. Panosh M.D.

## 2014-06-08 ENCOUNTER — Ambulatory Visit (INDEPENDENT_AMBULATORY_CARE_PROVIDER_SITE_OTHER): Payer: BLUE CROSS/BLUE SHIELD | Admitting: Podiatrist

## 2014-06-08 ENCOUNTER — Encounter: Payer: Self-pay | Admitting: Podiatrist

## 2014-06-08 ENCOUNTER — Ambulatory Visit (INDEPENDENT_AMBULATORY_CARE_PROVIDER_SITE_OTHER): Payer: BLUE CROSS/BLUE SHIELD

## 2014-06-08 VITALS — BP 134/86 | HR 86 | Resp 12

## 2014-06-08 DIAGNOSIS — R52 Pain, unspecified: Secondary | ICD-10-CM

## 2014-06-08 DIAGNOSIS — M19079 Primary osteoarthritis, unspecified ankle and foot: Secondary | ICD-10-CM

## 2014-06-08 DIAGNOSIS — M129 Arthropathy, unspecified: Secondary | ICD-10-CM | POA: Diagnosis not present

## 2014-06-20 ENCOUNTER — Other Ambulatory Visit: Payer: Self-pay | Admitting: Family Medicine

## 2014-06-20 ENCOUNTER — Ambulatory Visit: Payer: Self-pay

## 2014-06-20 DIAGNOSIS — Z79899 Other long term (current) drug therapy: Secondary | ICD-10-CM

## 2014-06-22 ENCOUNTER — Ambulatory Visit
Admission: RE | Admit: 2014-06-22 | Discharge: 2014-06-22 | Disposition: A | Discharge status: Home / Self Care | Payer: BLUE CROSS/BLUE SHIELD | Source: Ambulatory Visit

## 2014-06-22 DIAGNOSIS — Z1231 Encounter for screening mammogram for malignant neoplasm of breast: Secondary | ICD-10-CM

## 2014-06-26 NOTE — Progress Notes (Signed)
Chief Complaint  Patient presents with  . Foot Pain    ''RT FOOT START HURTING AGAIN 2 WEEKS AGO.''     HPI: Patient is 64 y.o. female who presents today for right foot pain that started 2 weeks ago. She previously had a stress fracture that went on to heal uneventfully.  She denies any new trauma or injury to the foot.    Allergies  Allergen Reactions  . Ace Inhibitors Cough  . Erythromycin     All Mycins  . Iodine   . Macrobid [Nitrofurantoin]     dizziness  . Other     CT Contrast    Physical Exam  Patient is awake, alert, and oriented x 3.  In no acute distress.  Vascular status is intact with palpable pedal pulses at 2/4 DP and PT bilateral and capillary refill time within normal limits. Neurological sensation is also intact bilaterally via Semmes Weinstein monofilament at 5/5 sites. Light touch, vibratory sensation, Achilles tendon reflex is intact. Dermatological exam reveals skin color, turger and texture as normal. No open lesions present.  Musculature intact with dorsiflexion, plantarflexion, inversion, eversion.  xrays do not show any sign of stress fracture or reaction.  Some midfoot arthritis is present.    Assessment: arthritis midfoot.   Plan: recommended antiinflammatory medications and offloading the foot.  She will call if the pain persists.

## 2014-07-04 ENCOUNTER — Encounter: Payer: Self-pay | Admitting: Podiatrist

## 2014-07-04 ENCOUNTER — Ambulatory Visit (INDEPENDENT_AMBULATORY_CARE_PROVIDER_SITE_OTHER): Payer: BLUE CROSS/BLUE SHIELD

## 2014-07-04 ENCOUNTER — Ambulatory Visit (INDEPENDENT_AMBULATORY_CARE_PROVIDER_SITE_OTHER): Payer: BLUE CROSS/BLUE SHIELD | Admitting: Podiatrist

## 2014-07-04 VITALS — BP 123/67 | HR 96 | Resp 14

## 2014-07-04 DIAGNOSIS — M19079 Primary osteoarthritis, unspecified ankle and foot: Secondary | ICD-10-CM

## 2014-07-04 DIAGNOSIS — R52 Pain, unspecified: Secondary | ICD-10-CM | POA: Diagnosis not present

## 2014-07-04 DIAGNOSIS — M719 Bursopathy, unspecified: Secondary | ICD-10-CM

## 2014-07-04 DIAGNOSIS — M129 Arthropathy, unspecified: Secondary | ICD-10-CM

## 2014-07-04 MED ORDER — DICLOFENAC SODIUM 1 % TD GEL: 2.0000 g | Freq: Four times a day (QID) | TRANSDERMAL | Status: DC

## 2014-07-04 NOTE — Patient Instructions (Signed)
Bursitis °Bursitis is a swelling and soreness (inflammation) of a fluid-filled sac (bursa) that overlies and protects a joint. It can be caused by injury, overuse of the joint, arthritis or infection. The joints most likely to be affected are the elbows, shoulders, hips and knees. °HOME CARE INSTRUCTIONS  °· Apply ice to the affected area for 15-20 minutes each hour while awake for 2 days. Put the ice in a plastic bag and place a towel between the bag of ice and your skin. °· Rest the injured joint as much as possible, but continue to put the joint through a full range of motion, 4 times per day. (The shoulder joint especially becomes rapidly "frozen" if not used.) When the pain lessens, begin normal slow movements and usual activities. °· Only take over-the-counter or prescription medicines for pain, discomfort or fever as directed by your caregiver. °· Your caregiver may recommend draining the bursa and injecting medicine into the bursa. This may help the healing process. °· Follow all instructions for follow-up with your caregiver. This includes any orthopedic referrals, physical therapy and rehabilitation. Any delay in obtaining necessary care could result in a delay or failure of the bursitis to heal and chronic pain. °SEEK IMMEDIATE MEDICAL CARE IF:  °· Your pain increases even during treatment. °· You develop an oral temperature above 102° F (38.9° C) and have heat and inflammation over the involved bursa. °MAKE SURE YOU:  °· Understand these instructions. °· Will watch your condition. °· Will get help right away if you are not doing well or get worse. °Document Released: 02/14/2000 Document Revised: 05/11/2011 Document Reviewed: 05/08/2013 °Exit[employer]® Patient Information ©2015 [employer]. This information is not intended to replace advice given to you by your health care provider. Make sure you discuss any questions you have with your health care provider. ° °

## 2014-07-04 NOTE — Progress Notes (Signed)
   Subjective:    Patient ID: Jade Cardenas, female    DOB: 1950/10/17, 64 y.o.   MRN: [id number]  HPI "Right lateral side of foot, never got better, got worse and now it is in the left lateral foot, is very painful, started a few weeks ago" Is taking tylenol arthritis which does not help.  Review of Systems  Gastrointestinal: Positive for diarrhea.       IBS  Musculoskeletal: Positive for back pain.       Objective:   Physical Exam  neuro vascular status is intact and unchanged. Pain on the lateral aspect of the fifth metatarsal head right foot.  Same location of pain but on the left foot is present.  Sensitivity on the head of the fifth metatarsal head is present bilateral.  xrays are negative for fracture.  No sign of injury or abnormality seen on xray.       Assessment & Plan:  Bursitis head of fifth metatarsal bilateral  Plan:  Injected the right foot with dexamethasone and marcaine mix.  Discussed padding due to her fat pad atrophy on the lateral side of her feet.  She will wear supportive shoes. Will call if she fails to have benefit from the injection.  Will use voltaren gel on the left and right foot.

## 2014-07-20 ENCOUNTER — Telehealth: Payer: Self-pay | Admitting: *Deleted

## 2014-07-20 NOTE — Telephone Encounter (Signed)
Voltaren Gel needed authorization from [place].  Request was denied.  I sent denial notice to [facility] on [facility].

## 2014-07-23 NOTE — Telephone Encounter (Signed)
We can get something similar from [facility] if she would like.  I believe she has already gotten voltaren gel from another physican in the past?  If she wants to try something similar, I can write it for [facility].   Thx.

## 2014-07-24 ENCOUNTER — Other Ambulatory Visit (INDEPENDENT_AMBULATORY_CARE_PROVIDER_SITE_OTHER): Payer: BLUE CROSS/BLUE SHIELD

## 2014-07-24 ENCOUNTER — Telehealth: Payer: Self-pay | Admitting: *Deleted

## 2014-07-24 DIAGNOSIS — Z79899 Other long term (current) drug therapy: Secondary | ICD-10-CM | POA: Diagnosis not present

## 2014-07-24 LAB — CBC WITH DIFFERENTIAL/PLATELET
Basophils Absolute: 0 10*3/uL (ref 0.0–0.1)
Basophils Relative: 0.7 % (ref 0.0–3.0)
Eosinophils Absolute: 0.1 10*3/uL (ref 0.0–0.7)
Eosinophils Relative: 1.4 % (ref 0.0–5.0)
HCT: 35.4 % — ABNORMAL LOW (ref 36.0–46.0)
Hemoglobin: 11.9 g/dL — ABNORMAL LOW (ref 12.0–15.0)
Lymphocytes Relative: 33.3 % (ref 12.0–46.0)
Lymphs Abs: 1.5 10*3/uL (ref 0.7–4.0)
MCHC: 33.5 g/dL (ref 30.0–36.0)
MCV: 85.8 fl (ref 78.0–100.0)
Monocytes Absolute: 0.3 10*3/uL (ref 0.1–1.0)
Monocytes Relative: 6.5 % (ref 3.0–12.0)
Neutro Abs: 2.6 10*3/uL (ref 1.4–7.7)
Neutrophils Relative %: 58.1 % (ref 43.0–77.0)
Platelets: 326 10*3/uL (ref 150.0–400.0)
RBC: 4.13 Mil/uL (ref 3.87–5.11)
RDW: 13.8 % (ref 11.5–15.5)
WBC: 4.6 10*3/uL (ref 4.0–10.5)

## 2014-07-24 LAB — COMPLETE METABOLIC PANEL WITH GFR
ALT: 26 U/L (ref 0–35)
AST: 20 U/L (ref 0–37)
Albumin: 4.2 g/dL (ref 3.5–5.2)
Alkaline Phosphatase: 90 U/L (ref 39–117)
BUN: 15 mg/dL (ref 6–23)
CO2: 25 mEq/L (ref 19–32)
Calcium: 9.6 mg/dL (ref 8.4–10.5)
Chloride: 104 mEq/L (ref 96–112)
Creat: 0.74 mg/dL (ref 0.50–1.10)
GFR, Est African American: >89 mL/min
GFR, Est Non African American: 87 mL/min
Glucose, Bld: 164 mg/dL — ABNORMAL HIGH (ref 70–99)
Potassium: 4.6 mEq/L (ref 3.5–5.3)
Sodium: 142 mEq/L (ref 135–145)
Total Bilirubin: 0.5 mg/dL (ref 0.2–1.2)
Total Protein: 7.1 g/dL (ref 6.0–8.3)

## 2014-07-24 NOTE — Telephone Encounter (Signed)
Per Dr. Valentina Lucks, I called and informed patient that her insurance would not cover Voltaren Gel.  Dr. Valentina Lucks said she could prescribe a cream from a compound company called [facility] out of [place], [place].  They deliver it to your home.  Is that something you may be interested in trying?  "Well let me hold off.  My foot has gotten worse.  I'm scheduled to see her tomorrow.  I'll discuss it with her then."

## 2014-07-25 ENCOUNTER — Encounter: Payer: Self-pay | Admitting: Podiatrist

## 2014-07-25 ENCOUNTER — Ambulatory Visit (INDEPENDENT_AMBULATORY_CARE_PROVIDER_SITE_OTHER): Payer: BLUE CROSS/BLUE SHIELD | Admitting: Podiatrist

## 2014-07-25 VITALS — BP 124/74 | HR 96 | Resp 12

## 2014-07-25 DIAGNOSIS — M779 Enthesopathy, unspecified: Secondary | ICD-10-CM | POA: Diagnosis not present

## 2014-07-25 DIAGNOSIS — M719 Bursopathy, unspecified: Secondary | ICD-10-CM | POA: Diagnosis not present

## 2014-07-25 NOTE — Progress Notes (Signed)
Chief Complaint  Patient presents with  . Foot Pain    ''B/L FEET ARE PAINFUL ESPECIALLY THE RT FOOT.''   Patient has tried extensive conservative treatments for the right foot and the foot is not improving. She feels a knot at the head of the fifth metatarsal and near the fifth mpj joint.  The injection was not beneficial, she has tried wearing the cam walker which was not helpful, she has tried antiinflammatories and has had no relief in symptoms.       Objective:   Physical Exam  neuro vascular status is intact and unchanged. Pain on the lateral aspect of the fifth metatarsal head right foot. Pain submet 5 with a palpable possible cyst present.  No bone spur is seen on xray.  left foot pain is also present on the fifth metatarsal head laterally.  Sensitivity on the head of the fifth metatarsal head is present bilateral.  Plain film xrays are negative for fracture.     Assessment & Plan:  Fracture versus cyst head of fifth metatarsal right.    Plan: due to the extensive conservative therapy that has failed, I recommended an MRI to discern between a fracture or cyst or tendon tear.  MRI will be ordered through  imaging.  I will follow up with her regarding the result and decide on treatment options likely needing surgical intervention at that time.

## 2014-07-26 ENCOUNTER — Encounter: Payer: Self-pay | Admitting: Podiatrist

## 2014-08-03 ENCOUNTER — Telehealth: Payer: Self-pay | Admitting: *Deleted

## 2014-08-03 NOTE — Telephone Encounter (Signed)
I left patient a message that it appears that procedure has been authorized until 08/24/2014.  Call on Monday if you have any further questions.

## 2014-08-04 ENCOUNTER — Ambulatory Visit
Admission: RE | Admit: 2014-08-04 | Discharge: 2014-08-04 | Disposition: A | Discharge status: Home / Self Care | Payer: BLUE CROSS/BLUE SHIELD | Source: Ambulatory Visit | Attending: Podiatrist | Admitting: Podiatrist

## 2014-08-04 DIAGNOSIS — M719 Bursopathy, unspecified: Secondary | ICD-10-CM

## 2014-08-04 DIAGNOSIS — M779 Enthesopathy, unspecified: Secondary | ICD-10-CM

## 2014-08-26 ENCOUNTER — Other Ambulatory Visit: Payer: Self-pay | Admitting: Internal Medicine

## 2014-08-27 ENCOUNTER — Ambulatory Visit (INDEPENDENT_AMBULATORY_CARE_PROVIDER_SITE_OTHER): Payer: BLUE CROSS/BLUE SHIELD | Admitting: Podiatry

## 2014-08-27 ENCOUNTER — Encounter: Payer: Self-pay | Admitting: Podiatry

## 2014-08-27 VITALS — BP 116/78 | HR 85 | Resp 12

## 2014-08-27 DIAGNOSIS — M779 Enthesopathy, unspecified: Secondary | ICD-10-CM

## 2014-08-27 DIAGNOSIS — M205X1 Other deformities of toe(s) (acquired), right foot: Secondary | ICD-10-CM | POA: Diagnosis not present

## 2014-08-27 NOTE — Telephone Encounter (Signed)
#  90 sent to the pharmacy by e-scribe.  Pt has upcoming CPX scheduled for 11/27/14

## 2014-08-27 NOTE — Progress Notes (Signed)
Patient ID: Jade Cardenas, female   DOB: 1950-08-14, 64 y.o.   MRN: [id number]  Subjective: 64 year old female presents the office they for follow-up evaluation of right foot pain and to discuss MRI results. She states that she has continued to have pain to her foot and the pain is been getting worse. She states that she has pain to the outside aspect of the foot for which she points the fifth metatarsal head on the side as well as on the bottom of the area. She states that she has pain with weightbearing with and without shoes describes as a throbbing, aching pain. She has had multiple conservative treatments without any resolution an MRI was ordered last appointment. She denies any history of injury or trauma to the area and she denies any changes increase activity the time off of the symptoms. She denies any swelling or any redness overlying the area at this time. She has no other pain to her foot at this time. No other complaints at this time. No acute changes since last appointment.  Objective: AAO 3, NAD DP/PT pulses palpable, CRT less than 3 seconds Protective sensation intact with Simms Weinstein monofilament, vibratory sensation intact There is tenderness palpation overlying the lateral the right fifth metatarsal head as well as the plantar aspect of the fifth metatarsal head. There is a moderate tailor's bunion deformity present. There is no bursa identified. There is no overlying edema, erythema, increase in warmth. There is prominent metatarsal heads plantarly with atrophy of the fat pad. There is no tenderness to other areas of the foot particularly the heel. There is no pain with lateral compression of the calcaneus or pain with vibratory sensation. There is no overlying edema, erythema, increase in warmth to bilateral lower extremities. MMT 5/5, ROM WNL No open lesions or pre-ulcerative lesions No pain with calf compression, swelling, warmth, erythema.   Assessment: 64 year old female  right fifth metatarsal head pain, likely as a result of the tailor's bunion.  Plan: -Treatment options discussed including all alternatives, risks, and complications -MRI results were discussed with the patient. There is marrow edema in the distal lateral calcaneus which could be a bone contusion or stress reaction. Clinically there is no tenderness overlying this area there is no clinical symptoms. Also revealed mild midfoot degenerative changes. Intact foot ligaments and tendons. I am going to have a second opinion of the MRI to specifically look at the fifth metatarsal head area. -I believe that the majority the patient symptoms are due to the underlying tailor's bunion deformity. I discussed surgical correction of this which included fifth metatarsal osteotomy with screw fixation. However prior to elective foot surgery I would attempt at clearance from her rheumatologist and primary care physician. She's tentative scheduled for surgery debridement of August have the date is dependent on clearance. I discussed with the surgery to hopefully help reduce her pain however this is not a guarantee. -Follow-up after received the results of the MRI over read. In the meantime, call the office with any questions/concerns/change in symptoms.   Celesta Gentile, DPM

## 2014-08-28 ENCOUNTER — Telehealth: Payer: Self-pay | Admitting: *Deleted

## 2014-08-28 NOTE — Telephone Encounter (Addendum)
-----   Message from Trula Slade, DPM sent at 08/27/2014  8:37 PM EDT ----- Can we send her MRI out for an over read? I want them to specifically comment on the 5th metatarsal head area as that is where her pain is at.  Also, can we send a letter to her rheumatologist asking for clearance?   Thanks. Matt  08/28/2014 left message with [facility] address and phone number, requested the copy of the pt's MRI CD for 08/04/2014.  MRI CD sent to [facility].  Medical Clearance letter sent to Dr. Bo Merino, [address], [place], [place] [address].

## 2014-08-29 ENCOUNTER — Encounter: Payer: Self-pay | Admitting: *Deleted

## 2014-08-29 ENCOUNTER — Telehealth: Payer: Self-pay | Admitting: *Deleted

## 2014-08-29 NOTE — Telephone Encounter (Signed)
ENTERED IN ERROR

## 2014-08-31 ENCOUNTER — Telehealth: Payer: Self-pay | Admitting: *Deleted

## 2014-08-31 NOTE — Telephone Encounter (Signed)
I called patient to get the name of her Rheumatologist and name of her primary care doctor.  "Someone has already called for this.  Dr. Estanislado Pandy is my Rheumatologist and Dr. Regis Bill is my primary care doctor.  Is this all you needed?"  Yes, that's all I need, we're trying to get medical clearance to perform your surgery.  "Do I speak to you about cost of my surgery or is that someone else?"  Someone will call you back.

## 2014-09-06 ENCOUNTER — Encounter: Payer: Self-pay | Admitting: *Deleted

## 2014-09-06 NOTE — Telephone Encounter (Signed)
I'm calling you about your estimate that you requested.  You have a deductible of $500.  The procedure costs about $970.  "I've met my deductible so my insurance will cover at 100%.  I received a clearance letter from my Rheumatologist.  I'm scheduled to see Dr. Jacqualyn Posey next week.  So, my surgery is scheduled for 08/03 correct?"  I don't have you scheduled yet.  "Oh okay, he and I discussed it.  I guess I'll finalize when I come in for my appointment next week."  Yes, that's correct.

## 2014-09-07 ENCOUNTER — Encounter: Payer: Self-pay | Admitting: Family Medicine

## 2014-09-14 ENCOUNTER — Ambulatory Visit (INDEPENDENT_AMBULATORY_CARE_PROVIDER_SITE_OTHER): Payer: BLUE CROSS/BLUE SHIELD | Admitting: Podiatry

## 2014-09-14 ENCOUNTER — Ambulatory Visit (INDEPENDENT_AMBULATORY_CARE_PROVIDER_SITE_OTHER): Payer: BLUE CROSS/BLUE SHIELD

## 2014-09-14 ENCOUNTER — Encounter: Payer: Self-pay | Admitting: Podiatry

## 2014-09-14 VITALS — BP 104/69 | HR 88 | Resp 18

## 2014-09-14 DIAGNOSIS — M205X1 Other deformities of toe(s) (acquired), right foot: Secondary | ICD-10-CM

## 2014-09-14 DIAGNOSIS — S92912A Unspecified fracture of left toe(s), initial encounter for closed fracture: Secondary | ICD-10-CM | POA: Diagnosis not present

## 2014-09-14 DIAGNOSIS — R52 Pain, unspecified: Secondary | ICD-10-CM

## 2014-09-14 NOTE — Patient Instructions (Signed)
Pre-Operative Instructions  Congratulations, you have decided to take an important step to improving your quality of life.  You can be assured that the doctors of [facility] will be with you every step of the way.  1. Plan to be at the surgery center/hospital at least 1 (one) hour prior to your scheduled time unless otherwise directed by the surgical center/hospital staff.  You must have a responsible adult accompany you, remain during the surgery and drive you home.  Make sure you have directions to the surgical center/hospital and know how to get there on time. 2. For hospital based surgery you will need to obtain a history and physical form from your family physician within 1 month prior to the date of surgery- we will give you a form for you primary physician.  3. We make every effort to accommodate the date you request for surgery.  There are however, times where surgery dates or times have to be moved.  We will contact you as soon as possible if a change in schedule is required.   4. No Aspirin/Ibuprofen for one week before surgery.  If you are on aspirin, any non-steroidal anti-inflammatory medications (Mobic, Aleve, Ibuprofen) you should stop taking it 7 days prior to your surgery.  You make take Tylenol  For pain prior to surgery.  5. Medications- If you are taking daily heart and blood pressure medications, seizure, reflux, allergy, asthma, anxiety, pain or diabetes medications, make sure the surgery center/hospital is aware before the day of surgery so they may notify you which medications to take or avoid the day of surgery. 6. No food or drink after midnight the night before surgery unless directed otherwise by surgical center/hospital staff. 7. No alcoholic beverages 24 hours prior to surgery.  No smoking 24 hours prior to or 24 hours after surgery. 8. Wear loose pants or shorts- loose enough to fit over bandages, boots, and casts. 9. No slip on shoes, sneakers are best. 10. Bring  your boot with you to the surgery center/hospital.  Also bring crutches or a walker if your physician has prescribed it for you.  If you do not have this equipment, it will be provided for you after surgery. 11. If you have not been contracted by the surgery center/hospital by the day before your surgery, call to confirm the date and time of your surgery. 12. Leave-time from work may vary depending on the type of surgery you have.  Appropriate arrangements should be made prior to surgery with your employer. 13. Prescriptions will be provided immediately following surgery by your doctor.  Have these filled as soon as possible after surgery and take the medication as directed. 14. Remove nail polish on the operative foot. 15. Wash the night before surgery.  The night before surgery wash the foot and leg well with the antibacterial soap provided and water paying special attention to beneath the toenails and in between the toes.  Rinse thoroughly with water and dry well with a towel.  Perform this wash unless told not to do so by your physician.  Enclosed: 1 Ice pack (please put in freezer the night before surgery)   1 Hibiclens skin cleaner   Pre-op Instructions  If you have any questions regarding the instructions, do not hesitate to call our office.  [place]: [address] [place], [place] [address] [phone]  [place]: [address] [place], [place] [address] [phone]  [place]: [address]  [place], [place] [address] [phone]  Dr. Richard   Tuchman DPM, Dr. Norman Regal DPM Dr. Richard Sikora DPM, Dr. M. Todd Hyatt DPM, Dr. Kathryn Egerton DPM 

## 2014-09-18 ENCOUNTER — Encounter: Payer: Self-pay | Admitting: Podiatrist

## 2014-09-18 NOTE — Progress Notes (Signed)
Patient ID: Jade Cardenas, female   DOB: [birth year], 64 y.o.   MRN: [id number]  Subjective: 64 year old female presents the [facility] follow-up evaluation of right foot pain. Also since last clinic she did hit her left foot on the big toe on a shopping cart and she's had pain to the area, however not as much as the right foot. There is been something around the toenail on the left big toe. She denies any redness the left big toe when he drainage from the toenail. She has a brought in pictures of her right foot which showed the redness over on the outside part of her foot (over the tailor's bunion), which was not present last appointment. She states of the foam pad that made her last appointment with a Taylor's bunion did seem to help and she is asking for another one at this time. She denies any systemic complaints as fevers, chills, nausea, vomiting. No other complaints at this time.  Objective: AAO 3, NAD Neurovascular status intact and unchanged On the lateral aspect of the right foot overlying, tailors bunion deformity there is tenderness to palpation along the area. There is pain erythema over this area from shoe gear. There is no specific area pinpoint bony tenderness elsewhere. No pain with fifth MTPJ range of motion. On the left hallux toenail there is small amount of blood on the proximal nail border. The toenail Pearson effect adhered to the underlying nail bed. There is mild to palpation overlying the distal aspect of the hallux. There is no swelling erythema, ascending cellulitis, fluctuance, crepitus, malodor. He does appear to the proximal nail border started to lift. No other areas of tenderness to bilateral lower extremes. No open lesions or pre-ulcerative lesions elsewhere. No pain with calf compression, swelling, warmth, erythema.  Assessment: 64 year old female with continued pain right tailor's bunion deformity, likely left hallux fracture, subungual hematoma  Plan: -X-rays were  obtained and reviewed with the patient. There is a question area of radiolucency on left distal phalanx consistent with a fracture. Because of this will immobilize and a surgical shoe. I discussed with her hallux toenail removal however we will will hold off on that at this time continue to monitor the area. If the area remains symptomatic she'll like to have the toenail removed the day of surgery. She'll follow-up in 2 weeks recheck this area. -A new foam offloading pads dispensed for the right tailor's bunion deformity. I again discuss surgical intervention with the patient which included right tailor's bunionectomy with screw fixation. As she is attended multiple conservative treatments she like to proceed with surgical intervention to help decrease her pain and deformity. The incision placement as well as the postoperative course was discussed with the patient. I discussed risks of the surgery which include, but not limited to, infection, bleeding, pain, swelling, need for further surgery, delayed or nonhealing, painful or ugly scar, numbness or sensation changes, over/under correction, recurrence, transfer lesions, further deformity, hardware failure, DVT/PE, loss of toe/foot. Patient understands these risks and wishes to proceed with surgery. The surgical consent was reviewed with the patient all 3 pages were signed. No promises or guarantees were given to the outcome of the procedure. All questions were answered to the best of my ability. Before the surgery the patient was encouraged to call the office if there is any further questions. The surgery will be performed at the [facility] on an outpatient basis.  Celesta Gentile, DPM

## 2014-10-01 ENCOUNTER — Encounter: Payer: Self-pay | Admitting: Podiatry

## 2014-10-01 ENCOUNTER — Ambulatory Visit (INDEPENDENT_AMBULATORY_CARE_PROVIDER_SITE_OTHER): Payer: BLUE CROSS/BLUE SHIELD | Admitting: Podiatry

## 2014-10-01 ENCOUNTER — Ambulatory Visit (INDEPENDENT_AMBULATORY_CARE_PROVIDER_SITE_OTHER): Payer: BLUE CROSS/BLUE SHIELD

## 2014-10-01 VITALS — BP 120/73 | HR 97 | Resp 12

## 2014-10-01 DIAGNOSIS — M8430XA Stress fracture, unspecified site, initial encounter for fracture: Secondary | ICD-10-CM | POA: Diagnosis not present

## 2014-10-01 DIAGNOSIS — M205X1 Other deformities of toe(s) (acquired), right foot: Secondary | ICD-10-CM

## 2014-10-01 DIAGNOSIS — S92912D Unspecified fracture of left toe(s), subsequent encounter for fracture with routine healing: Secondary | ICD-10-CM | POA: Diagnosis not present

## 2014-10-01 NOTE — Progress Notes (Signed)
Patient ID: Jade Cardenas, female   DOB: 1950-05-17, 64 y.o.   MRN: [id number]  Subjective: 64 year old female presents the office they for follow-up evaluation of left hallux fracture. She states that she is continue with surgical shoe at all times. She states that she'll longer has any pain in the toe. She has noticed this moment of bruising along the proximal nail border however she denies illicit and the toenail denies any surrounding redness or any drainage. No other complaints at this time in no acute changes since last appointment.  Objective: AAO 3, NAD Neurovascular status intact Left foot there is no tenderness to palpation overlying the hallux. The hallux toenail is firmly adhered to the underlying nail bed. There is a small amount of bruising on the proximal nail border. There is no surrounding erythema, ascending cellulitis, fluctuance, crepitus, malodor, drainage. Continue to be pain on the outside aspect of the right foot on the side of a moderate tailor's bunion deformity. At this time there is no overlying erythema coming increase in warmth. No other areas of tenderness to bilateral lower extremities No open lesions or pre-ulcerative lesions No pain with calf compression, sling, warmth, erythema  Assessment: 65 year old female follow up evaluation left hallux fracture, doing well  Plan: -X-rays were obtained and reviewed with the patient. There does continue be a radiolucent line in the distal phalanx consistent with a fracture. -Treatment options discussed including all alternatives, risks, and complications -At this time as the pain subsides she can discontinue the surgical shoe return to right of supportive shoe gear. The hallux toenails firmly adhered to the underlying nail bed and she wishes to hold off on removing the toenail. -She scheduled Surgery for right tailor's bunion deformity on Wednesday. I answered her questions that she had surgery today. -Follow-up after  surgery or sooner if any problems arise. In the meantime, encouraged to call the office with any questions, concerns, change in symptoms.   Celesta Gentile, DPM

## 2014-10-03 ENCOUNTER — Encounter: Payer: Self-pay | Admitting: Podiatry

## 2014-10-03 DIAGNOSIS — M779 Enthesopathy, unspecified: Secondary | ICD-10-CM | POA: Diagnosis not present

## 2014-10-03 DIAGNOSIS — M2011 Hallux valgus (acquired), right foot: Secondary | ICD-10-CM | POA: Diagnosis not present

## 2014-10-03 HISTORY — PX: BUNIONECTOMY: SHX129

## 2014-10-05 ENCOUNTER — Telehealth: Payer: Self-pay | Admitting: *Deleted

## 2014-10-05 NOTE — Telephone Encounter (Signed)
She can hold off on any antibiotic, it is just prophylactic.  Continue ice/elevation. If she wants we can go up to Vicodin 10/325

## 2014-10-05 NOTE — Telephone Encounter (Addendum)
Pt states she is having a reaction to the antibiotic, having itching hives from the Keflex, and severe pain.  Instructed pt to stop Keflex, take Benadryl OTC as package directs and take Dr. Leigh Aurora prescribe pain medication as directed for higher dosing and if tolerates can add antiinflammatory OTC medication in between pain med.  Pt states she is able to take Aleve more readily then the Ibuprofen and will do that.  Instructed pt to remove the ace only and elevate the surgical foot 15 mins then rewrap ace looser, may ice without the ace, remain in the boot at all times and can remove to elevate, but must be in to sleep and walk.  Pt states understanding.  I informed pt of Dr. Leigh Aurora order to hold off on the antibiotics.

## 2014-10-08 ENCOUNTER — Ambulatory Visit (INDEPENDENT_AMBULATORY_CARE_PROVIDER_SITE_OTHER): Payer: BLUE CROSS/BLUE SHIELD | Admitting: Podiatry

## 2014-10-08 ENCOUNTER — Encounter: Payer: Self-pay | Admitting: Podiatry

## 2014-10-08 ENCOUNTER — Ambulatory Visit (INDEPENDENT_AMBULATORY_CARE_PROVIDER_SITE_OTHER): Payer: BLUE CROSS/BLUE SHIELD

## 2014-10-08 VITALS — BP 122/76 | HR 82 | Resp 12

## 2014-10-08 DIAGNOSIS — Z9889 Other specified postprocedural states: Secondary | ICD-10-CM

## 2014-10-08 DIAGNOSIS — M205X1 Other deformities of toe(s) (acquired), right foot: Secondary | ICD-10-CM | POA: Diagnosis not present

## 2014-10-08 NOTE — Progress Notes (Signed)
Patient ID: Jade Cardenas, female   DOB: 04-12-1950, 64 y.o.   MRN: [id number]  DOS: 10/03/14 s/p R tailor's bunion; left 5th MTPJ injection  Subjective: 64 year old female presents the office today with a friend for postop visit #1 status post right tailor's bunionectomy. She states that overall she is doing well and her pain is controlled. She did discontinue the Keflex to side effects. She's been continuing the surgical shoe. She does continue to have pain to the left side and that the injection did not help much. She denies any systemic complaints as fevers, chills, nausea, vomiting. Denies any calf pain, chest pain, shortness of breath. No other complaints at this time in no acute changes his last appointment.  Objective: AAO 3, NAD DP/PT pulses palpable, CRT less than 3 seconds Protective sensation appears to be intact with [name] monofilament. There is no subjective numbness or tingling. Incision on the lateral aspect of the right foot as well coapted without any evidence of dehiscence. There is pain and erythema tracking around the incision my treatment formation as opposed to infection. There is no ascending cellulitis. There is no increase in warmth. There is no drainage or purulence from the incision. There is mild tailors palpation to the overlying the incision however there is no other areas of tenderness to the right foot. There is mild tenderness overlying the left fifth metatarsal head laterally was prominent Taylor's bunion deformity present. No other areas of tenderness to bilateral lower [place]. No overlying edema, erythema, increase in warmth felt wear. No open lesions or pre-ulcerative lesions elsewhere. There is no pain with calf compression, swelling, warmth, erythema.  Assessment: Status post right tailor's bunionectomy, left fifth MTPJ injection  Plan: -X-rays were obtained and reviewed with the patient.  -Treatment options discussed including all alternatives,  risks, and complications -Dressings were removed. Antibiotic ointment was placed over the incision followed by dry sterile dressing. Keep the dressing clean, dry, intact. -Continue surgical shoe at all times. -Pain medication as needed -Ice and elevation -Discussed possible surgical intervention the left side when she is able to return to assure the right side. -Monitor for any clinical signs or symptoms of infection and directed to call the office immediately should any occur or go to the ER. -Follow-up 1 week for suture removal or sooner if any problems arise. In the meantime, encouraged to call the office with any questions, concerns, change in symptoms.   Celesta Gentile, DPM

## 2014-10-09 NOTE — Progress Notes (Signed)
DOS 10/03/2014 Right foot surgical correction of tailors bunion with screw fixation.

## 2014-10-12 ENCOUNTER — Telehealth: Payer: Self-pay | Admitting: Podiatry

## 2014-10-12 ENCOUNTER — Ambulatory Visit: Payer: BC Managed Care – PPO | Admitting: Obstetrics & Gynecology

## 2014-10-12 NOTE — Telephone Encounter (Signed)
Patient sent me a facebook message asking if she can go out to lunch with her friends. Told her she can go but ice and elevate after as it will swell more the more she is on it. Wear surgical shoe.

## 2014-10-19 ENCOUNTER — Encounter: Payer: Self-pay | Admitting: Podiatry

## 2014-10-19 ENCOUNTER — Ambulatory Visit (INDEPENDENT_AMBULATORY_CARE_PROVIDER_SITE_OTHER): Payer: BLUE CROSS/BLUE SHIELD | Admitting: Podiatry

## 2014-10-19 VITALS — BP 141/86 | HR 69 | Resp 12

## 2014-10-19 DIAGNOSIS — Z9889 Other specified postprocedural states: Secondary | ICD-10-CM

## 2014-10-19 DIAGNOSIS — M205X1 Other deformities of toe(s) (acquired), right foot: Secondary | ICD-10-CM

## 2014-10-19 DIAGNOSIS — M205X2 Other deformities of toe(s) (acquired), left foot: Secondary | ICD-10-CM

## 2014-10-25 NOTE — Progress Notes (Signed)
Patiento. Marland Kitchen   MRN: [id number]  DOS: 10/03/14 s/p R tailor's bunion; left 5th MTPJ injection  Subjective: 64 year old female presents the office today with a friend for postop visit #2 status post right tailor's bunionectomy. She states that overall she is doing well and her pain is controlled. She's been continuing the surgical shoe. She does continue to have pain to the left side and it has been hurting more. She would like to go ahead and schedule surgery for the left foot as she has tried padding, shoe changes without any relief.  She denies any systemic complaints as fevers, chills, nausea, vomiting. Denies any calf pain, chest pain, shortness of breath. No other complaints at this time in no acute changes his last appointment.  Objective: AAO 3, NAD DP/PT pulses palpable, CRT less than 3 seconds Protective sensation appears to be intact with [name] monofilament. There is no subjective numbness or tingling. Incision on the lateral aspect of the right foot as well coapted without any evidence of dehiscence.  At this time there is no surrounding erythema. There is no ascending cellulitis. There is no increase in warmth. There is no drainage or purulence from the incision. There is mild tenderness to palpation to the overlying the incision however there is no other areas of tenderness to the right foot.There is tenderness overlying the lateral aspect of the fifth metatarsal head on the left foot overlying the tailors bunion deformity.  No other areas of tenderness to bilateral lower extremities. No pain to the left hallux. No overlying edema, erythema, increase in warmth.No open lesions or pre-ulcerative lesions elsewhere. There is no pain with calf compression, swelling, warmth, erythema.  Assessment: Status post right tailor's bunionectomy, left fifth MTPJ injection; continued pain left tailors bunion  Plan: -Treatment options discussed including all alternatives, risks, and  complications -Sutures were removed. Antibiotic ointment was placed over the incision followed by dry sterile dressing. Keep the dressing clean, dry, intact. She can start to shower tomorrow as long as the incision remains coapted. If there are any problems to hold off on showering and call the office.  -Continue surgical shoe at all times. -Pain medication as needed -Ice and elevation -Discussed possible surgical intervention the left side when she is able to return to assure the right side. -Monitor for any clinical signs or symptoms of infection and directed to call the office immediately should any occur or go to the ER. -Scheduled left foot surgery -Follow-up 2 week for suture removal or sooner if any problems arise. In the meantime, encouraged to call the office with any questions, concerns, change in symptoms.  *Will need to do consent for left foot surgery   Celesta Gentile, DPM

## 2014-10-26 ENCOUNTER — Ambulatory Visit (INDEPENDENT_AMBULATORY_CARE_PROVIDER_SITE_OTHER): Payer: BLUE CROSS/BLUE SHIELD | Admitting: Podiatry

## 2014-10-26 ENCOUNTER — Encounter: Payer: Self-pay | Admitting: Podiatry

## 2014-10-26 ENCOUNTER — Ambulatory Visit (INDEPENDENT_AMBULATORY_CARE_PROVIDER_SITE_OTHER): Payer: BLUE CROSS/BLUE SHIELD

## 2014-10-26 VITALS — BP 155/80 | HR 80 | Resp 16

## 2014-10-26 DIAGNOSIS — Z9889 Other specified postprocedural states: Secondary | ICD-10-CM

## 2014-10-26 DIAGNOSIS — M205X1 Other deformities of toe(s) (acquired), right foot: Secondary | ICD-10-CM

## 2014-10-26 DIAGNOSIS — M205X2 Other deformities of toe(s) (acquired), left foot: Secondary | ICD-10-CM

## 2014-10-30 NOTE — Progress Notes (Signed)
Patient ID: Jade Cardenas, female   DOB: 1951/02/26, 64 y.o.   MRN: [id number]  Subjective: 64 year old female presents the office today status post right tailor's bunionectomy. She states that overall she is doing well although the area does feel stiff. She states her pain is controlled. She is continuing with the surgical shoe.she also other further discuss surgery to her left foot. She continues to have significant pain to her left foot at the site of the tailor's bunion. She has tried various padding, shoe changes, injections without any relief. She doesn't to go ahead and proceed with surgical intervention on the left side as well. She denies any systemic complaints as fevers, chills, nausea, vomiting. Denies any calf pain, chest pain, shortness of breath. No other complaints this time in no acute changes since last appointment.  Objective: AAO 3, NAD DP/PT pulses palpable, CRT less than 3 seconds Protective sensation intact with Simms Weinstein monofilament Incisional the dorsal lateral aspect of the right foot along the tailor's bunions well coapted without any evidence of dehiscence. There are a few remaining sutures still present. There is mild localized erythema. There is no tenderness along the side at this time. There is no surrounding erythema, ascending cellulitis, fluctuation, crepitus, odor, drainage. There is no clinicalsigns of infections at this time. On the left foot there is a significant Taylor's bunion deformity with erythema overlying the lateral head of the fifth metatarsal due to irritation she year. There is tenderness to palpation overlying this area. There is no pain with fifth MTPJ range of motion. No other areas of tenderness to bilateral lower extremity. No other open lesions or pre-ulcerative lesions. No pain with calf compression swelling, warmth, erythema.  Assessment: 64 year old female status post right foot tailor's bunionectomy with continued pain overlying  left tailors bunion.  Plan:  1. S/P Right Tailor's bunionectomy -X-rays were obtained and reviewed with the patient.  -Remaining sutures were removed without, occasions. Antibiotic limits place of the incision followed by a dressing. She can remove the dressing to monitor the shower as long as the incision remains coapted. It is any problems to hold off on showering call the office. -Continue with interrupted ointment and a bandage daily. Once the scars formed can switch to cocoa butter by me cream. -Continue surgical shoe. -Ice and elevation -Pain medication as needed -She can start to drive in 1 week and left there is any pain on hold off on driving. -Follow-up in 2 weeks or sooner if any problems arise. In the meantime, encouraged to call the office with any questions, concerns, change in symptoms.    2. Left tailor's bunion -Treatment options discussed including all alternatives, risks, and complications. I have a long discussion regarding both conservative and surgical treatment options. At this time select proceed with surgical intervention as she has exhausted conservative treatment without any resolution. -The proposed surgery is left foot tailor's bunionectomy with fifth metatarsal osteotomy and screw fixation -The incision placement as well as the postoperative course was discussed with the patient. I discussed risks of the surgery which include, but not limited to, infection, bleeding, pain, swelling, need for further surgery, delayed or nonhealing, painful or ugly scar, numbness or sensation changes, over/under correction, recurrence, transfer lesions, further deformity, hardware failure, DVT/PE, loss of toe/foot. Patient understands these risks and wishes to proceed with surgery. The surgical consent was reviewed with the patient all 3 pages were signed. No promises or guarantees were given to the outcome of the procedure. All questions were  answered to the best of my ability. Before  the surgery the patient was encouraged to call the office if there is any further questions. The surgery will be performed at the [facility] on an outpatient basis.

## 2014-11-07 ENCOUNTER — Other Ambulatory Visit: Payer: Self-pay | Admitting: Internal Medicine

## 2014-11-08 NOTE — Telephone Encounter (Signed)
Filled for 9 months on Jan 18/2016.  Request is too soon.  Pt has upcoming appt.  Will fill at that time.

## 2014-11-09 ENCOUNTER — Telehealth: Payer: Self-pay | Admitting: *Deleted

## 2014-11-09 NOTE — Telephone Encounter (Signed)
I'm returning your call.  You have [employer] and it does not require pre-certification for surgery.  "Oh, okay I just wanted to make sure because I already had surgery on one foot.  I wanted to make sure that everything would be covered.  Thank you."

## 2014-11-09 NOTE — Telephone Encounter (Signed)
"  I'm a patient of Dr. Jacqualyn Posey.  I want to make sure we got my insurance clearance for my upcoming surgery that's taking place 09/21.  Thanks so much.

## 2014-11-12 ENCOUNTER — Ambulatory Visit (INDEPENDENT_AMBULATORY_CARE_PROVIDER_SITE_OTHER): Payer: BLUE CROSS/BLUE SHIELD | Admitting: Podiatry

## 2014-11-12 ENCOUNTER — Encounter: Payer: Self-pay | Admitting: Podiatry

## 2014-11-12 ENCOUNTER — Ambulatory Visit (INDEPENDENT_AMBULATORY_CARE_PROVIDER_SITE_OTHER): Payer: BLUE CROSS/BLUE SHIELD

## 2014-11-12 VITALS — BP 128/67 | HR 90 | Resp 18

## 2014-11-12 DIAGNOSIS — L601 Onycholysis: Secondary | ICD-10-CM

## 2014-11-12 DIAGNOSIS — Z9889 Other specified postprocedural states: Secondary | ICD-10-CM

## 2014-11-12 DIAGNOSIS — M205X2 Other deformities of toe(s) (acquired), left foot: Secondary | ICD-10-CM

## 2014-11-12 DIAGNOSIS — M205X1 Other deformities of toe(s) (acquired), right foot: Secondary | ICD-10-CM

## 2014-11-12 DIAGNOSIS — M79671 Pain in right foot: Secondary | ICD-10-CM

## 2014-11-12 NOTE — Progress Notes (Addendum)
Patient ID: Jade Cardenas, female   DOB: 1950-09-27, 64 y.o.   MRN: [id number]  Subjective: 64 year old female presents the office they with concerns of left big toenail which fell off over the weekend. She did send me a message and I recommended to soak in Epson salts and covering with antibiotic ointment and a Band-Aid for which she has been doing. She denies any drainage or any redness from the area. Denies any pain to the area. She denies any history of injury or trauma at the time of the nail falling off. She continues to the right side is doing well and she did start a dry this week. She has not required any pain medication. She is continuing the surgical shoe. She denies any systemic complaints as fevers, chills, nausea, vomiting. No calf pain, chest pain comes from his breath.  Objective: AAO 3, NAD DP/PT pulses are palpable, CRT less than 3 seconds Protective sensation intact with [name] monofilament Left hallux toenail has fallen off. There is no tenderness to palpation over the nail bed and the scab is starting to form. There is no surrounding erythema, ascending cellulitis, fluctuance, crepitus, malodor, drainage/purulence. Remaining nails are firmly adhered. Incisional dorsal lateral aspect of the right foot as well coapted of the scar. There is no tenderness to palpation about surgical service time. There is trace edema to the area without any associated erythema or increase in warmth. No pain with MPJ range of motion. No other areas of tenderness of bilateral lower extremity's. No other areas of edema, erythema, increase in warmth. No pain with calf compression, swelling, warmth, erythema.  Assessment: 64 year old female left hallux onycholysis, right foot status post tailor's bunionectomy doing well  Plan:  1. Left hallux onycholysis -Continue Epson salt soaks and antibiotic ointment and a Band-Aid daily. Monitor for any clinical signs or symptoms of infection and  directed to call the office immediately should any occur or go to the ER. If not healed within the next 1-2 weeks to call the office for follow-up appointment.  2. S/p R tailors bunion -X-rays were obtained and reviewed with the patient. -At this time she inserted transition back into regular, supportive sneaker. Do not go barefoot. There is any increase in pain to continue with surgical shoe. Discussed this with gradual transition. -Continue ice and elevation -Pain medications needed  3. Left tailors bunion -Scheduled for surgery later this month.  Follow-up after her left foot Taylor's bunion surgery or prior Is any problems with the left hallux nail bed site or right surgical site. If she is unable to wear a regular shoe prior to surgery on the right foot to call the office.  Celesta Gentile, DPM   Addendum: Patient sent me a facebook message about a scopolamine patch due to postop nausea/vomiting after the last surgery. We had discussed this in the office. I sent this to the pharmacy.

## 2014-11-15 MED ORDER — SCOPOLAMINE 1 MG/3DAYS TD PT72: 1.0000 | MEDICATED_PATCH | TRANSDERMAL | Status: DC

## 2014-11-15 NOTE — Addendum Note (Signed)
Addended by: Celesta Gentile R on: 11/15/2014 07:42 AM   Modules accepted: Orders

## 2014-11-16 ENCOUNTER — Encounter: Payer: Self-pay | Admitting: Obstetrics & Gynecology

## 2014-11-16 ENCOUNTER — Ambulatory Visit (INDEPENDENT_AMBULATORY_CARE_PROVIDER_SITE_OTHER): Payer: BLUE CROSS/BLUE SHIELD | Admitting: Obstetrics & Gynecology

## 2014-11-16 ENCOUNTER — Encounter: Payer: BLUE CROSS/BLUE SHIELD | Admitting: Podiatry

## 2014-11-16 VITALS — BP 140/78 | HR 88 | Resp 20 | Ht 64.0 in | Wt 219.0 lb

## 2014-11-16 DIAGNOSIS — Z124 Encounter for screening for malignant neoplasm of cervix: Secondary | ICD-10-CM | POA: Diagnosis not present

## 2014-11-16 DIAGNOSIS — Z1159 Encounter for screening for other viral diseases: Secondary | ICD-10-CM

## 2014-11-16 DIAGNOSIS — Z01419 Encounter for gynecological examination (general) (routine) without abnormal findings: Secondary | ICD-10-CM

## 2014-11-16 LAB — HEPATITIS C ANTIBODY: HCV Ab: NEGATIVE

## 2014-11-16 NOTE — Progress Notes (Addendum)
64 y.o. G0P0000 WidowedCaucasianF here for annual exam.  Doing well.  Keeping very busy.  Still working part-time with [employer].  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    Patient has had several issues with her feet.  She is using a cane right now just for stability in walking.  Has questions about Hep C testing.  Reviewed recommendations.    Stopped HRT and doing well.  PCP:  Dr. Regis Bill.  Labs reviewed.     Patient's last menstrual period was 03/02/1984.          Sexually active: No.  The current method of family planning is status post hysterectomy.    Exercising: Yes.    walking Smoker:  no  Health Maintenance: Pap:  09/25/13 Neg  History of abnormal Pap:  Yes years ago, h/o DES exposure MMG:  06/22/14 BIRADS1:neg Colonoscopy:  2013 repeat 5 years, [name] BMD:   7/15, -1.9/-1.2 TDaP:  2010 Screening Labs: PCP, Hb today: PCP, Urine today: PCP   reports that she has never smoked. She has never used smokeless tobacco. She reports that she drinks alcohol. She reports that she does not use illicit drugs.  Past Medical History  Diagnosis Date  . HYPERLIPIDEMIA 12/29/2006    takes Lovastatin nightly  . Right bundle branch block 02/09/2007  . IBS 04/28/2007  . MENOPAUSAL DISORDER 12/07/2007  . Rheumatoid arthritis(714.0) 12/29/2006  . SYNCOPE 12/07/2007  . WEIGHT GAIN 12/18/2008  . Palpitations 02/09/2007  . UNS ADVRS EFF OTH RX MEDICINAL\T\BIOLOGICAL SBSTNC 02/09/2007  . GASTRIC ULCER, ACUTE, HEMORRHAGE, HX OF 02/09/2007  . COLONIC POLYPS, HX OF 02/09/2007  . REDUCTION MAMMOPLASTY, HX OF 02/09/2007  . HYPERTENSION 12/29/2006    takes Enalapril daily  . Headache(784.0)     occasionally  . CHOLECYSTECTOMY, HX OF   . Fibromyalgia     doesn't require meds  . Joint pain   . Joint swelling   . Chronic back pain   . Bruises easily   . H/O hiatal hernia   . GERD  (gastroesophageal reflux disease)     takes Nexium daily  . UTI (lower urinary tract infection)     hx of;saw urologist last yr and he told her she was over reacting and not a big deal   . Anemia     iron def  . Hx of seasonal allergies     takes Claritin daily  . Insomnia     related to pain;takes Flexeril and Tylenol PM nightly  . S/P lumbar fusion 6 13 09/03/2011    L4 L5  posterior   . Cough, persistent 01/21/2012    poss from acei   . 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 Abnormal Pap smear     H/O  . Goiter   . Arthritis     rheumatoid and osteo now flt to be psoriateic   . Arthritis     psoreratic, osteoarthritis    Past Surgical History  Procedure Laterality Date  . Breast reduction surgery      last 80's  . Cervical fusion      2 12   . Tonsillectomy      as a child  . Abdominal hysterectomy  1986    TAH/BSO  . Cholecystectomy  1996  . Back surgery  12+yrs ago    Synovial Cyst removal  . Bone  spur  6-80yrs ago    right ankle  . Colonoscopy    . Back surgery  6/13    lumbar L4-L5 fusion, lamenectomy  . Blood transfused  6/13    transfused 2 units post op from back surgery    Current Outpatient Prescriptions  Medication Sig Dispense Refill  . Acetaminophen (TYLENOL ARTHRITIS PAIN PO) Take 1,300 mg by mouth 2 (two) times daily as needed.     Marland Kitchen amLODipine (NORVASC) 5 MG tablet TAKE 1 TABLET BY MOUTH ONCE DAILY 90 tablet 2  . Diphenhydramine-APAP, sleep, (TYLENOL PM EXTRA STRENGTH PO) Take 2 tablets by mouth at bedtime as needed. For sleep    . esomeprazole (NEXIUM) 40 MG capsule Take 1 capsule (40 mg total) by mouth daily before breakfast. 30 capsule 11  . losartan (COZAAR) 100 MG tablet TAKE 1 TABLET EVERY DAY 90 tablet 0  . lovastatin (MEVACOR) 20 MG tablet TAKE 1 TABLET BY MOUTH EVERY DAY AT BEDTIME 90 tablet 3  . Multiple Vitamin (MULTIVITAMIN) tablet Take 1 tablet by mouth daily.      Marland Kitchen olopatadine (PATANOL) 0.1 % ophthalmic solution Place 2 drops into both  eyes as needed.   2  . scopolamine (TRANSDERM-SCOP, 1.5 MG,) 1 MG/3DAYS Place 1 patch (1.5 mg total) onto the skin every 3 (three) days. 1 patch 0  . SIMPONI 50 MG/0.5ML SOAJ   1   No current facility-administered medications for this visit.    Family History  Problem Relation Age of Onset  . Diabetes Mother   . Colon cancer Mother     x2  . Arthritis Mother   . Hypertension Mother   . Stroke Father   . Heart disease Father   . Arthritis Brother   . Hypertension Brother   . Hyperlipidemia Brother   . Anesthesia problems Neg Hx   . Hypotension Neg Hx   . Malignant hyperthermia Neg Hx   . Pseudochol deficiency Neg Hx   . Other      DISH brother  . Endometrial cancer Mother   . Colon cancer Father   . Hypertension Maternal Grandmother   . Stroke Maternal Grandmother   . Stroke Father   . Infertility Brother     ROS:  Pertinent items are noted in HPI.  Otherwise, a comprehensive ROS was negative.  Exam:   BP 140/78 mmHg  Pulse 88  Resp 20  Ht 5\' 4"  (1.626 m)  Wt 219 lb (99.338 kg)  BMI 37.57 kg/m2  LMP 03/02/1984  Weight change: +1Height: 5\' 4"  (162.6 cm)  Ht Readings from Last 3 Encounters:  11/16/14 5\' 4"  (1.626 m)  05/24/14 5\' 4"  (1.626 m)  02/28/14 5\' 4"  (1.626 m)    General appearance: alert, cooperative and appears stated age Head: Normocephalic, without obvious abnormality, atraumatic Neck: no adenopathy, supple, symmetrical, trachea midline and thyroid normal to inspection and palpation Lungs: clear to auscultation bilaterally Breasts: normal appearance, no masses or tenderness, well healed scars Heart: regular rate and rhythm Abdomen: soft, non-tender; bowel sounds normal; no masses,  no organomegaly Extremities: extremities normal, atraumatic, no cyanosis or edema Skin: Skin color, texture, turgor normal. No rashes or lesions Lymph nodes: Cervical, supraclavicular, and axillary nodes normal. No abnormal inguinal nodes palpated Neurologic: Grossly  normal   Pelvic: External genitalia:  no lesions              Urethra:  normal appearing urethra with no masses, tenderness or lesions  Bartholins and Skenes: normal                 Vagina: normal appearing vagina with normal color and discharge, no lesions              Cervix: absent              Pap taken: Yes.   Bimanual Exam:  Uterus:  uterus absent              Adnexa: normal adnexa and no mass, fullness, tenderness               Rectovaginal: Confirms               Anus:  normal sphincter tone, no lesions  Simponi injection 50mg /0.51ml given in left arm in subcutaneous tissue.  Lot [id number], Exp 7/17.    Chaperone was present for exam.  A:  Well Woman with normal exam  S/p TAH/BSO, off HRT H/O DES exposure  Family hx of colon cancer  H/O anemia that has resolved.  Followed by Dr. Regis Bill. Arthritis. Seeing Dr. Estanislado Pandy.  P: Mammogram yearly  pap smear today. Done yearly due to DES exposure  Injection given as per above.  Hep C testing today. return annually or prn

## 2014-11-16 NOTE — Addendum Note (Signed)
Addended by: Megan Salon on: 11/16/2014 09:59 AM   Modules accepted: Miquel Dunn

## 2014-11-19 LAB — IPS PAP TEST WITH REFLEX TO HPV

## 2014-11-20 ENCOUNTER — Telehealth: Payer: Self-pay | Admitting: Obstetrics & Gynecology

## 2014-11-20 NOTE — Telephone Encounter (Signed)
Spoke with patient. Advised of message as seen below from [place]. Patient is agreeable and verbalizes understanding. Denies any current symptoms. Will return call if she develops any vaginal symptoms.  Notes Recorded by Robley Fries, CMA on 11/20/2014 at 10:03 AM Left message to return call. Notes Recorded by Megan Salon, MD on 11/19/2014 at 2:48 PM Inform pap is negative. Yeast was noted on the pap smear. If having any symptoms, can treat with Diflucan 150mg  po x 1, Repeat 72 hours if needed. #2/0RF. Notes Recorded by Megan Salon, MD on 11/18/2014 at 10:26 PM Informed via MyChart of negative result  Routing to provider for final review. Patient agreeable to disposition. Will close encounter.

## 2014-11-20 NOTE — Telephone Encounter (Signed)
Patient said she is returning a call to [place]. No open TC note?

## 2014-11-21 ENCOUNTER — Encounter: Payer: Self-pay | Admitting: *Deleted

## 2014-11-21 DIAGNOSIS — M2012 Hallux valgus (acquired), left foot: Secondary | ICD-10-CM | POA: Diagnosis not present

## 2014-11-22 ENCOUNTER — Telehealth: Payer: Self-pay | Admitting: *Deleted

## 2014-11-22 NOTE — Progress Notes (Signed)
Surgery performed at [facility] for [place] left foot by Dr. Jacqualyn Posey.  Prescription was written for Vicodin 5/325, quantity 40.

## 2014-11-22 NOTE — Telephone Encounter (Signed)
I'm calling to see how you are doing.  "I'm having a rough time this time.  Last night it felt like the foot was on fire.  I'm staying in bed and icing it.  It could be my arthritis acting up due to the weather.  I haven't been up on it much, just to take my dog for a little walk, that's it.  Is it okay to take Ibuprofen in between the dosages?  I have been taking 2 of the pain pills every 4 hours."  Yes, it is okay to supplement with Ibuprofen in between the dosages.  "Okay, thanks for calling."  Call if you have any further questions or concerns.

## 2014-11-27 ENCOUNTER — Encounter: Payer: Self-pay | Admitting: Internal Medicine

## 2014-11-27 ENCOUNTER — Ambulatory Visit (INDEPENDENT_AMBULATORY_CARE_PROVIDER_SITE_OTHER): Payer: BLUE CROSS/BLUE SHIELD | Admitting: Internal Medicine

## 2014-11-27 VITALS — BP 138/72 | HR 96 | Temp 97.6°F | Resp 16 | Ht 64.0 in | Wt 221.4 lb

## 2014-11-27 DIAGNOSIS — Z111 Encounter for screening for respiratory tuberculosis: Secondary | ICD-10-CM | POA: Diagnosis not present

## 2014-11-27 DIAGNOSIS — L4050 Arthropathic psoriasis, unspecified: Secondary | ICD-10-CM

## 2014-11-27 DIAGNOSIS — I10 Essential (primary) hypertension: Secondary | ICD-10-CM

## 2014-11-27 DIAGNOSIS — E785 Hyperlipidemia, unspecified: Secondary | ICD-10-CM

## 2014-11-27 DIAGNOSIS — Z79899 Other long term (current) drug therapy: Secondary | ICD-10-CM | POA: Diagnosis not present

## 2014-11-27 DIAGNOSIS — Z23 Encounter for immunization: Secondary | ICD-10-CM

## 2014-11-27 DIAGNOSIS — R252 Cramp and spasm: Secondary | ICD-10-CM

## 2014-11-27 MED ORDER — LOSARTAN POTASSIUM 100 MG PO TABS: 100.0000 mg | ORAL_TABLET | Freq: Every day | ORAL | Status: DC

## 2014-11-27 MED ORDER — LOVASTATIN 20 MG PO TABS: 20.0000 mg | ORAL_TABLET | Freq: Every day | ORAL | Status: DC

## 2014-11-27 MED ORDER — AMLODIPINE BESYLATE 5 MG PO TABS: 5.0000 mg | ORAL_TABLET | Freq: Every day | ORAL | Status: DC

## 2014-11-27 NOTE — Patient Instructions (Addendum)
Will add  Lipid panel hga1c tsh and magnesium  For monitor .  Marland Kitchen   Will notify you  of labs when available. Glad your arthritis is doing better,   Health Maintenance Adopting a healthy lifestyle and getting preventive care can go a long way to promote health and wellness. Talk with your health care provider about what schedule of regular examinations is right for you. This is a good chance for you to check in with your provider about disease prevention and staying healthy. In between checkups, there are plenty of things you can do on your own. Experts have done a lot of research about which lifestyle changes and preventive measures are most likely to keep you healthy. Ask your health care provider for more information. WEIGHT AND DIET  Eat a healthy diet  Be sure to include plenty of vegetables, fruits, low-fat dairy products, and lean protein.  Do not eat a lot of foods high in solid fats, added sugars, or salt.  Get regular exercise. This is one of the most important things you can do for your health.  Most adults should exercise for at least 150 minutes each week. The exercise should increase your heart rate and make you sweat (moderate-intensity exercise).  Most adults should also do strengthening exercises at least twice a week. This is in addition to the moderate-intensity exercise.  Maintain a healthy weight  Body mass index (BMI) is a measurement that can be used to identify possible weight problems. It estimates body fat based on height and weight. Your health care provider can help determine your BMI and help you achieve or maintain a healthy weight.  For females 57 years of age and older:   A BMI below 18.5 is considered underweight.  A BMI of 18.5 to 24.9 is normal.  A BMI of 25 to 29.9 is considered overweight.  A BMI of 30 and above is considered obese.  Watch levels of cholesterol and blood lipids  You should start having your blood tested for lipids and  cholesterol at 62 years of age, then have this test every 5 years.  You may need to have your cholesterol levels checked more often if:  Your lipid or cholesterol levels are high.  You are older than 62 years of age.  You are at high risk for heart disease.  CANCER SCREENING   Lung Cancer  Lung cancer screening is recommended for adults 4-55 years old who are at high risk for lung cancer because of a history of smoking.  A yearly low-dose CT scan of the lungs is recommended for people who:  Currently smoke.  Have quit within the past 15 years.  Have at least a 30-pack-year history of smoking. A pack year is smoking an average of one pack of cigarettes a day for 1 year.  Yearly screening should continue until it has been 15 years since you quit.  Yearly screening should stop if you develop a health problem that would prevent you from having lung cancer treatment.  Breast Cancer  Practice breast self-awareness. This means understanding how your breasts normally appear and feel.  It also means doing regular breast self-exams. Let your health care provider know about any changes, no matter how small.  If you are in your 20s or 30s, you should have a clinical breast exam (CBE) by a health care provider every 1-3 years as part of a regular health exam.  If you are 30 or older, have a CBE every  year. Also consider having a breast X-ray (mammogram) every year.  If you have a family history of breast cancer, talk to your health care provider about genetic screening.  If you are at high risk for breast cancer, talk to your health care provider about having an MRI and a mammogram every year.  Breast cancer gene (BRCA) assessment is recommended for women who have family members with BRCA-related cancers. BRCA-related cancers include:  Breast.  Ovarian.  Tubal.  Peritoneal cancers.  Results of the assessment will determine the need for genetic counseling and BRCA1 and BRCA2  testing. Cervical Cancer Routine pelvic examinations to screen for cervical cancer are no longer recommended for nonpregnant women who are considered low risk for cancer of the pelvic organs (ovaries, uterus, and vagina) and who do not have symptoms. A pelvic examination may be necessary if you have symptoms including those associated with pelvic infections. Ask your health care provider if a screening pelvic exam is right for you.   The Pap test is the screening test for cervical cancer for women who are considered at risk.  If you had a hysterectomy for a problem that was not cancer or a condition that could lead to cancer, then you no longer need Pap tests.  If you are older than 65 years, and you have had normal Pap tests for the past 10 years, you no longer need to have Pap tests.  If you have had past treatment for cervical cancer or a condition that could lead to cancer, you need Pap tests and screening for cancer for at least 20 years after your treatment.  If you no longer get a Pap test, assess your risk factors if they change (such as having a new sexual partner). This can affect whether you should start being screened again.  Some women have medical problems that increase their chance of getting cervical cancer. If this is the case for you, your health care provider may recommend more frequent screening and Pap tests.  The human papillomavirus (HPV) test is another test that may be used for cervical cancer screening. The HPV test looks for the virus that can cause cell changes in the cervix. The cells collected during the Pap test can be tested for HPV.  The HPV test can be used to screen women 67 years of age and older. Getting tested for HPV can extend the interval between normal Pap tests from three to five years.  An HPV test also should be used to screen women of any age who have unclear Pap test results.  After 62 years of age, women should have HPV testing as often as Pap  tests.  Colorectal Cancer  This type of cancer can be detected and often prevented.  Routine colorectal cancer screening usually begins at 62 years of age and continues through 62 years of age.  Your health care provider may recommend screening at an earlier age if you have risk factors for colon cancer.  Your health care provider may also recommend using home test kits to check for hidden blood in the stool.  A small camera at the end of a tube can be used to examine your colon directly (sigmoidoscopy or colonoscopy). This is done to check for the earliest forms of colorectal cancer.  Routine screening usually begins at age 14.  Direct examination of the colon should be repeated every 5-10 years through 62 years of age. However, you may need to be screened more often if early  forms of precancerous polyps or small growths are found. Skin Cancer  Check your skin from head to toe regularly.  Tell your health care provider about any new moles or changes in moles, especially if there is a change in a mole's shape or color.  Also tell your health care provider if you have a mole that is larger than the size of a pencil eraser.  Always use sunscreen. Apply sunscreen liberally and repeatedly throughout the day.  Protect yourself by wearing long sleeves, pants, a wide-brimmed hat, and sunglasses whenever you are outside. HEART DISEASE, DIABETES, AND HIGH BLOOD PRESSURE   Have your blood pressure checked at least every 1-2 years. High blood pressure causes heart disease and increases the risk of stroke.  If you are between 58 years and 15 years old, ask your health care provider if you should take aspirin to prevent strokes.  Have regular diabetes screenings. This involves taking a blood sample to check your fasting blood sugar level.  If you are at a normal weight and have a low risk for diabetes, have this test once every three years after 62 years of age.  If you are overweight and  have a high risk for diabetes, consider being tested at a younger age or more often. PREVENTING INFECTION  Hepatitis B  If you have a higher risk for hepatitis B, you should be screened for this virus. You are considered at high risk for hepatitis B if:  You were born in a country where hepatitis B is common. Ask your health care provider which countries are considered high risk.  Your parents were born in a high-risk country, and you have not been immunized against hepatitis B (hepatitis B vaccine).  You have HIV or AIDS.  You use needles to inject street drugs.  You live with someone who has hepatitis B.  You have had sex with someone who has hepatitis B.  You get hemodialysis treatment.  You take certain medicines for conditions, including cancer, organ transplantation, and autoimmune conditions. Hepatitis C  Blood testing is recommended for:  Everyone born from [birth year] through 1963.  Anyone with known risk factors for hepatitis C. Sexually transmitted infections (STIs)  You should be screened for sexually transmitted infections (STIs) including gonorrhea and chlamydia if:  You are sexually active and are younger than 62 years of age.  You are older than 61 years of age and your health care provider tells you that you are at risk for this type of infection.  Your sexual activity has changed since you were last screened and you are at an increased risk for chlamydia or gonorrhea. Ask your health care provider if you are at risk.  If you do not have HIV, but are at risk, it may be recommended that you take a prescription medicine daily to prevent HIV infection. This is called pre-exposure prophylaxis (PrEP). You are considered at risk if:  You are sexually active and do not regularly use condoms or know the HIV status of your partner(s).  You take drugs by injection.  You are sexually active with a partner who has HIV. Talk with your health care provider about whether you  are at high risk of being infected with HIV. If you choose to begin PrEP, you should first be tested for HIV. You should then be tested every 3 months for as long as you are taking PrEP.  PREGNANCY   If you are premenopausal and you may become pregnant, ask your  health care provider about preconception counseling.  If you may become pregnant, take 400 to 800 micrograms (mcg) of folic acid every day.  If you want to prevent pregnancy, talk to your health care provider about birth control (contraception). OSTEOPOROSIS AND MENOPAUSE   Osteoporosis is a disease in which the bones lose minerals and strength with aging. This can result in serious bone fractures. Your risk for osteoporosis can be identified using a bone density scan.  If you are 39 years of age or older, or if you are at risk for osteoporosis and fractures, ask your health care provider if you should be screened.  Ask your health care provider whether you should take a calcium or vitamin D supplement to lower your risk for osteoporosis.  Menopause may have certain physical symptoms and risks.  Hormone replacement therapy may reduce some of these symptoms and risks. Talk to your health care provider about whether hormone replacement therapy is right for you.  HOME CARE INSTRUCTIONS   Schedule regular health, dental, and eye exams.  Stay current with your immunizations.   Do not use any tobacco products including cigarettes, chewing tobacco, or electronic cigarettes.  If you are pregnant, do not drink alcohol.  If you are breastfeeding, limit how much and how often you drink alcohol.  Limit alcohol intake to no more than 1 drink per day for nonpregnant women. One drink equals 12 ounces of beer, 5 ounces of wine, or 1 ounces of hard liquor.  Do not use street drugs.  Do not share needles.  Ask your health care provider for help if you need support or information about quitting drugs.  Tell your health care provider if  you often feel depressed.  Tell your health care provider if you have ever been abused or do not feel safe at home. Document Released: 09/01/2010 Document Revised: 07/03/2013 Document Reviewed: 01/18/2013 [facility] Patient Information 2015 [place], [place]. This information is not intended to replace advice given to you by your health care provider. Make sure you discuss any questions you have with your health care provider.     Influenza Vaccine (Flu Vaccine, Inactivated or Recombinant) 2014-2015: What You Need to Know 1. Why get vaccinated? Influenza ("flu") is a contagious disease that spreads around the [place] every winter, usually between October and May. Flu is caused by influenza viruses, and is spread mainly by coughing, sneezing, and close contact. Anyone can get flu, but the risk of getting flu is highest among children. Symptoms come on suddenly and may last several days. They can include:  fever/chills  sore throat  muscle aches  fatigue  cough  headache  runny or stuffy nose Flu can make some people much sicker than others. These people include young children, people 64 and older, pregnant women, and people with certain health conditions-such as heart, lung or kidney disease, nervous system disorders, or a weakened immune system. Flu vaccination is especially important for these people, and anyone in close contact with them. Flu can also lead to pneumonia, and make existing medical conditions worse. It can cause diarrhea and seizures in children. Each year thousands of people in the [place] die from flu, and many more are hospitalized. Flu vaccine is the best protection against flu and its complications. Flu vaccine also helps prevent spreading flu from person to person. 2. Inactivated and recombinant flu vaccines You are getting an injectable flu vaccine, which is either an "inactivated" or "recombinant" vaccine. These vaccines do not contain any  live  influenza virus. They are given by injection with a needle, and often called the "flu shot."  A different live, attenuated (weakened) influenza vaccine is sprayed into the nostrils. This vaccine is described in a separate Vaccine Information Statement. Flu vaccination is recommended every year. Some children 6 months through 18 years of age might need two doses during one year. Flu viruses are always changing. Each year's flu vaccine is made to protect against 3 or 4 viruses that are likely to cause disease that year. Flu vaccine cannot prevent all cases of flu, but it is the best defense against the disease.  It takes about 2 weeks for protection to develop after the vaccination, and protection lasts several months to a year. Some illnesses that are not caused by influenza virus are often mistaken for flu. Flu vaccine will not prevent these illnesses. It can only prevent influenza. Some inactivated flu vaccine contains a very small amount of a mercury-based preservative called thimerosal. Studies have shown that thimerosal in vaccines is not harmful, but flu vaccines that do not contain a preservative are available. 3. Some people should not get this vaccine Tell the person who gives you the vaccine:  If you have any severe, life-threatening allergies. If you ever had a life-threatening allergic reaction after a dose of flu vaccine, or have a severe allergy to any part of this vaccine, including (for example) an allergy to gelatin, antibiotics, or eggs, you may be advised not to get vaccinated. Most, but not all, types of flu vaccine contain a small amount of egg protein.  If you ever had Guillain-Barr Syndrome (a severe paralyzing illness, also called GBS). Some people with a history of GBS should not get this vaccine. This should be discussed with your doctor.  If you are not feeling well. It is usually okay to get flu vaccine when you have a mild illness, but you might be advised to wait until you  feel better. You should come back when you are better. 4. Risks of a vaccine reaction With a vaccine, like any medicine, there is a chance of side effects. These are usually mild and go away on their own. Problems that could happen after any vaccine:  Brief fainting spells can happen after any medical procedure, including vaccination. Sitting or lying down for about 15 minutes can help prevent fainting, and injuries caused by a fall. Tell your doctor if you feel dizzy, or have vision changes or ringing in the ears.  Severe shoulder pain and reduced range of motion in the arm where a shot was given can happen, very rarely, after a vaccination.  Severe allergic reactions from a vaccine are very rare, estimated at less than 1 in a million doses. If one were to occur, it would usually be within a few minutes to a few hours after the vaccination. Mild problems following inactivated flu vaccine:  soreness, redness, or swelling where the shot was given  hoarseness  sore, red or itchy eyes  cough  fever  aches  headache  itching  fatigue If these problems occur, they usually begin soon after the shot and last 1 or 2 days. Moderate problems following inactivated flu vaccine:  Young children who get inactivated flu vaccine and pneumococcal vaccine (PCV13) at the same time may be at increased risk for seizures caused by fever. Ask your doctor for more information. Tell your doctor if a child who is getting flu vaccine has ever had a seizure. Inactivated flu vaccine  does not contain live flu virus, so you cannot get the flu from this vaccine. As with any medicine, there is a very remote chance of a vaccine causing a serious injury or death. The safety of vaccines is always being monitored. For more information, visit: http://www.aguilar.org/ 5. What if there is a serious reaction? What should I look for?  Look for anything that concerns you, such as signs of a severe allergic reaction,  very high fever, or behavior changes. Signs of a severe allergic reaction can include hives, swelling of the face and throat, difficulty breathing, a fast heartbeat, dizziness, and weakness. These would start a few minutes to a few hours after the vaccination. What should I do?  If you think it is a severe allergic reaction or other emergency that can't wait, call 9-1-1 and get the person to the nearest hospital. Otherwise, call your doctor.  Afterward, the reaction should be reported to the Vaccine Adverse Event Reporting System (VAERS). Your doctor should file this report, or you can do it yourself through the VAERS web site at www.vaers.SamedayNews.es, or by calling 941-613-3326. VAERS does not give medical advice. 6. The National Vaccine Injury Compensation Program The [employer] Vaccine Injury Compensation Program (VICP) is a federal program that was created to compensate people who may have been injured by certain vaccines. Persons who believe they may have been injured by a vaccine can learn about the program and about filing a claim by calling 806-245-1687 or visiting the [employer] website at GoldCloset.com.ee. There is a time limit to file a claim for compensation. 7. How can I learn more?  Ask your health care provider.  Call your local or state health department.  Contact the Centers for Disease Control and Prevention (CDC):  Call (442) 399-8386 (1-800-CDC-INFO) or  Visit CDC's website at https://gibson.com/ CDC Vaccine Information Statement (Interim) Inactivated Influenza Vaccine (10/18/2012) Document Released: 12/11/2005 Document Revised: 07/03/2013 Document Reviewed: 02/03/2013 [facility] Patient Information 2015 [place]. This information is not intended to replace advice given to you by your health care provider. Make sure you discuss any questions you have with your health care provider.

## 2014-11-27 NOTE — Progress Notes (Signed)
Chief Complaint  Patient presents with  . Hypertension    check  . Medication Refill    HPI: Patient  Jade Cardenas  64 y.o. comes in today for Preventive Health Care visit  But done      Saw gyne suzanne   Annual.   Gave symponi shot .   Got flu vaccine  Check Thursday   2 surgeries right tailors bunion  And bone spurs and Sept 21 left foot    [place]  Dr  [facility]  .  Hx fracture left big toe.  Grocery cart .   Getting better .  Healed.   anesthesia  nausea sx  Patch pre   Surgery.   Got gi illness .   Last weekend. Using aleve.   Had hives  After keflex or other meds .  Bp has been good  Sometimes 120  And  Usually at goal   Leg cramps are back nocturnal  Had them years ago ? Could be from  Vv  No swelling  Redness  Using mustard and helps   Health Maintenance  Topic Date Due  . HIV Screening  05/27/1965  . INFLUENZA VACCINE  10/01/2014  . MAMMOGRAM  06/21/2016  . PAP SMEAR  11/15/2017  . TETANUS/TDAP  05/10/2018  . COLONOSCOPY  09/30/2021  . ZOSTAVAX  Completed  . Hepatitis C Screening  Completed     ROS:  GEN/ HEENT: No fever, significant weight changes sweats headaches vision problems hearing changes, CV/ PULM; No chest pain shortness of breath cough, syncope,edema  change in exercise tolerance. GI /GU: No adominal pain, vomiting, change in bowel habits. No blood in the stool. No significant GU symptoms. SKIN/HEME: ,no acute skin rashes suspicious lesions or bleeding. No lymphadenopathy, nodules, masses.  NEURO/ PSYCH:  No neurologic signs such as weakness numbness. No depression anxiety. IMM/ Allergy: No unusual infections.  Allergy .   REST of 12 system review negative except as per HPI   Past Medical History  Diagnosis Date  . HYPERLIPIDEMIA 12/29/2006    takes Lovastatin nightly  . Right bundle branch block 02/09/2007  . IBS 04/28/2007  . MENOPAUSAL DISORDER 12/07/2007  . Rheumatoid arthritis(714.0) 12/29/2006  . SYNCOPE 12/07/2007    . WEIGHT GAIN 12/18/2008  . Palpitations 02/09/2007  . UNS ADVRS EFF OTH RX MEDICINAL\T\BIOLOGICAL SBSTNC 02/09/2007  . GASTRIC ULCER, ACUTE, HEMORRHAGE, HX OF 02/09/2007  . COLONIC POLYPS, HX OF 02/09/2007  . REDUCTION MAMMOPLASTY, HX OF 02/09/2007  . HYPERTENSION 12/29/2006    takes Enalapril daily  . Headache(784.0)     occasionally  . CHOLECYSTECTOMY, HX OF   . Fibromyalgia     doesn't require meds  . Joint pain   . Joint swelling   . Chronic back pain   . Bruises easily   . H/O hiatal hernia   . GERD (gastroesophageal reflux disease)     takes Nexium daily  . UTI (lower urinary tract infection)     hx of;saw urologist last yr and he told her she was over reacting and not a big deal   . Anemia     iron def  . Hx of seasonal allergies     takes Claritin daily  . Insomnia     related to pain;takes Flexeril and Tylenol PM nightly  . S/P lumbar fusion 6 13 09/03/2011    L4 L5  posterior   . Cough, persistent 01/21/2012    poss from acei   . DES  exposure in utero, unknown   . Abnormal Pap smear     H/O  . Goiter   . Arthritis     rheumatoid and osteo now flt to be psoriateic   . Arthritis     psoreratic, osteoarthritis  . Broken toe     Past Surgical History  Procedure Laterality Date  . Breast reduction surgery      last 80's  . Cervical fusion      2 14   . Tonsillectomy      as a child  . Abdominal hysterectomy  1986    TAH/BSO  . Cholecystectomy  1996  . Back surgery  12+yrs ago    Synovial Cyst removal  . Bone spur  6-7yrs ago    right ankle  . Colonoscopy    . Back surgery  6/13    lumbar L4-L5 fusion, lamenectomy  . Blood transfused  6/13    transfused 2 units post op from back surgery  . Bunionectomy  10/03/14    right foot    Family History  Problem Relation Age of Onset  . Diabetes Mother   . Colon cancer Mother     x2  . Arthritis Mother   . Hypertension Mother   . Stroke Father   . Heart disease Father   . Arthritis Brother   .  Hypertension Brother   . Hyperlipidemia Brother   . Anesthesia problems Neg Hx   . Hypotension Neg Hx   . Malignant hyperthermia Neg Hx   . Pseudochol deficiency Neg Hx   . Other      DISH brother  . Endometrial cancer Mother   . Colon cancer Father   . Hypertension Maternal Grandmother   . Stroke Maternal Grandmother   . Stroke Father   . Infertility Brother     Social History   Social History  . Marital Status: Widowed    Spouse Name: N/A  . Number of Children: N/A  . Years of Education: N/A   Social History Main Topics  . Smoking status: Never Smoker   . Smokeless tobacco: Never Used  . Alcohol Use: No  . Drug Use: No  . Sexual Activity: No     Comment: TAH/BSO   Other Topics Concern  . 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 Acetaminophen (TYLENOL ARTHRITIS PAIN PO) Take 1,300 mg by mouth 2 (two) times daily as needed.     Marland Kitchen esomeprazole (NEXIUM) 40 MG capsule Take 1 capsule (40 mg total) by mouth daily before breakfast. 30 capsule 11  . Multiple Vitamin (MULTIVITAMIN) tablet Take 1 tablet by mouth daily.      Marland Kitchen olopatadine (PATANOL) 0.1 % ophthalmic solution Place 2 drops into both eyes as needed.   2  . SIMPONI 50 MG/0.5ML SOAJ   1  . triamcinolone cream (KENALOG) 0.1 %   0  . amLODipine (NORVASC) 5 MG tablet TAKE 1 TABLET BY MOUTH ONCE DAILY 90 tablet 2  . losartan (COZAAR) 100 MG tablet TAKE 1 TABLET EVERY DAY 90 tablet 0  . lovastatin (MEVACOR) 20 MG tablet TAKE 1 TABLET BY MOUTH EVERY DAY AT BEDTIME 90 tablet 3  . Diphenhydramine-APAP, sleep, (TYLENOL PM EXTRA STRENGTH PO) Take 2 tablets by mouth  at bedtime as needed. For sleep    . HYDROcodone-acetaminophen (NORCO/VICODIN) 5-325 MG per tablet Take 1-2 tablets by mouth every 4 (four) hours as  needed for moderate pain (every 4-6 hours).    Marland Kitchen scopolamine (TRANSDERM-SCOP, 1.5 MG,) 1 MG/3DAYS Place 1 patch (1.5 mg total) onto the skin every 3 (three) days. (Patient not taking: Reported on 11/27/2014) 1 patch 0   No facility-administered medications prior to visit.     EXAM:  BP 138/72 mmHg  Pulse 96  Temp(Src) 97.6 F (36.4 C) (Oral)  Resp 16  Ht _0  (1.626 m)  Wt 221 lb 6.4 oz (100.426 kg)  BMI 37.98 kg/m2  SpO2 99%  LMP 03/02/1984  Body mass index is 37.98 kg/(m^2).  Physical Exam: Vital signs reviewed KYH:CWCB is a well-developed well-nourished alert cooperative    who appearsr stated age in no acute distress.  HEENT: normocephalic atraumatic , Eyes: PERRL EOM's full, conjunctiva clear, Nares: paten,t no deformity discharge or tenderness., ENECK: supple without masses, thyromegaly or bruits. CHEST/PULM:  Clear to auscultation and percussion breath sounds equal no wheeze , rales or rhonchi.  CV: PMIS1 S2 no gallops, murmurs, rubs. Peripheral pulses are full without delay..   Extremtities:  No clubbing cyanosis or edema, left foot in boot  djd chahgesn no acute effusion noted  no acute joint swelling or redness  NEURO:  Oriented x3, cranial nerves 3-12 appear to be intact, no obvious focal weakness,SKIN: No acute rashes normal turgor, color, no bruising or petechiae. PSYCH: Oriented, good eye contact, no obvious depression anxiety, cognition and judgment appear normal.  BP Readings from Last 3 Encounters:  11/27/14 138/72  11/16/14 140/78  11/12/14 128/67    Lab Results  Component Value Date   WBC 4.6 07/24/2014   HGB 11.9* 07/24/2014   HCT 35.4* 07/24/2014   PLT 326.0 07/24/2014   GLUCOSE 164* 07/24/2014   CHOL 195 12/07/2013   TRIG 172.0* 12/07/2013   HDL 55.10 12/07/2013   LDLDIRECT 114.0 04/02/2010   LDLCALC 106* 12/07/2013   ALT 26 07/24/2014   AST 20 07/24/2014   NA 142 07/24/2014   K 4.6 07/24/2014   CL 104 07/24/2014   CREATININE 0.74  07/24/2014   BUN 15 07/24/2014   CO2 25 07/24/2014   TSH 0.37 12/07/2013    ASSESSMENT AND PLAN:  Discussed the following assessment and plan:  Essential hypertension - Plan: Hemoglobin A1c, Lipid panel, TSH, Magnesium  Hyperlipidemia - Plan: Hemoglobin A1c, Lipid panel, TSH, Magnesium  Cramps of lower extremity, unspecified laterality - hx of same could be multifactoria no vascular insufficncy  disc poss check labs fu ifpersist after inc acti - Plan: Hemoglobin A1c, TSH, Magnesium  Psoriatic arthritis - much inproved pain   Medication management  Flu vaccine need - Plan: Flu Vaccine QUAD 36+ mos IM  Need for tuberculosis vaccination - Plan: TB Skin Test  High risk medication use Reviewed   utd  On hm parameters  Pain much better and seems to be functioning better ,.    Check a1c beause f fam hx and  One elevaetd bg last check . Patient Care Team: [name], MD as PCP - General Richmond Campbell, MD (Gastroenterology) Megan Salon, MD (Gynecology) Josue Hector, MD (Cardiology) Bo Merino, MD as Consulting Physician (Rheumatology) Patient Instructions   Will add  Lipid panel hga1c tsh and magnesium  For monitor .  Marland Kitchen   Will notify you  of labs when available. Glad your arthritis is  doing better,   Health Maintenance Adopting a healthy lifestyle and getting preventive care can go a long way to promote health and wellness. Talk with your health care provider about what schedule of regular examinations is right for you. This is a good chance for you to check in with your provider about disease prevention and staying healthy. In between checkups, there are plenty of things you can do on your own. Experts have done a lot of research about which lifestyle changes and preventive measures are most likely to keep you healthy. Ask your health care provider for more information. WEIGHT AND DIET  Eat a healthy diet  Be sure to include plenty of vegetables, fruits, low-fat  dairy products, and lean protein.  Do not eat a lot of foods high in solid fats, added sugars, or salt.  Get regular exercise. This is one of the most important things you can do for your health.  Most adults should exercise for at least 150 minutes each week. The exercise should increase your heart rate and make you sweat (moderate-intensity exercise).  Most adults should also do strengthening exercises at least twice a week. This is in addition to the moderate-intensity exercise.  Maintain a healthy weight  Body mass index (BMI) is a measurement that can be used to identify possible weight problems. It estimates body fat based on height and weight. Your health care provider can help determine your BMI and help you achieve or maintain a healthy weight.  For females 3 years of age and older:   A BMI below 18.5 is considered underweight.  A BMI of 18.5 to 24.9 is normal.  A BMI of 25 to 29.9 is considered overweight.  A BMI of 30 and above is considered obese.  Watch levels of cholesterol and blood lipids  You should start having your blood tested for lipids and cholesterol at 62 years of age, then have this test every 5 years.  You may need to have your cholesterol levels checked more often if:  Your lipid or cholesterol levels are high.  You are older than 62 years of age.  You are at high risk for heart disease.  CANCER SCREENING   Lung Cancer  Lung cancer screening is recommended for adults 37-32 years old who are at high risk for lung cancer because of a history of smoking.  A yearly low-dose CT scan of the lungs is recommended for people who:  Currently smoke.  Have quit within the past 15 years.  Have at least a 30-pack-year history of smoking. A pack year is smoking an average of one pack of cigarettes a day for 1 year.  Yearly screening should continue until it has been 15 years since you quit.  Yearly screening should stop if you develop a health  problem that would prevent you from having lung cancer treatment.  Breast Cancer  Practice breast self-awareness. This means understanding how your breasts normally appear and feel.  It also means doing regular breast self-exams. Let your health care provider know about any changes, no matter how small.  If you are in your 20s or 30s, you should have a clinical breast exam (CBE) by a health care provider every 1-3 years as part of a regular health exam.  If you are 60 or older, have a CBE every year. Also consider having a breast X-ray (mammogram) every year.  If you have a family history of breast cancer, talk to your health care provider about genetic  screening.  If you are at high risk for breast cancer, talk to your health care provider about having an MRI and a mammogram every year.  Breast cancer gene (BRCA) assessment is recommended for women who have family members with BRCA-related cancers. BRCA-related cancers include:  Breast.  Ovarian.  Tubal.  Peritoneal cancers.  Results of the assessment will determine the need for genetic counseling and BRCA1 and BRCA2 testing. Cervical Cancer Routine pelvic examinations to screen for cervical cancer are no longer recommended for nonpregnant women who are considered low risk for cancer of the pelvic organs (ovaries, uterus, and vagina) and who do not have symptoms. A pelvic examination may be necessary if you have symptoms including those associated with pelvic infections. Ask your health care provider if a screening pelvic exam is right for you.   The Pap test is the screening test for cervical cancer for women who are considered at risk.  If you had a hysterectomy for a problem that was not cancer or a condition that could lead to cancer, then you no longer need Pap tests.  If you are older than 65 years, and you have had normal Pap tests for the past 10 years, you no longer need to have Pap tests.  If you have had past treatment  for cervical cancer or a condition that could lead to cancer, you need Pap tests and screening for cancer for at least 20 years after your treatment.  If you no longer get a Pap test, assess your risk factors if they change (such as having a new sexual partner). This can affect whether you should start being screened again.  Some women have medical problems that increase their chance of getting cervical cancer. If this is the case for you, your health care provider may recommend more frequent screening and Pap tests.  The human papillomavirus (HPV) test is another test that may be used for cervical cancer screening. The HPV test looks for the virus that can cause cell changes in the cervix. The cells collected during the Pap test can be tested for HPV.  The HPV test can be used to screen women 9 years of age and older. Getting tested for HPV can extend the interval between normal Pap tests from three to five years.  An HPV test also should be used to screen women of any age who have unclear Pap test results.  After 62 years of age, women should have HPV testing as often as Pap tests.  Colorectal Cancer  This type of cancer can be detected and often prevented.  Routine colorectal cancer screening usually begins at 62 years of age and continues through 62 years of age.  Your health care provider may recommend screening at an earlier age if you have risk factors for colon cancer.  Your health care provider may also recommend using home test kits to check for hidden blood in the stool.  A small camera at the end of a tube can be used to examine your colon directly (sigmoidoscopy or colonoscopy). This is done to check for the earliest forms of colorectal cancer.  Routine screening usually begins at age 16.  Direct examination of the colon should be repeated every 5-10 years through 62 years of age. However, you may need to be screened more often if early forms of precancerous polyps or small  growths are found. Skin Cancer  Check your skin from head to toe regularly.  Tell your health care provider about any  new moles or changes in moles, especially if there is a change in a mole's shape or color.  Also tell your health care provider if you have a mole that is larger than the size of a pencil eraser.  Always use sunscreen. Apply sunscreen liberally and repeatedly throughout the day.  Protect yourself by wearing long sleeves, pants, a wide-brimmed hat, and sunglasses whenever you are outside. HEART DISEASE, DIABETES, AND HIGH BLOOD PRESSURE   Have your blood pressure checked at least every 1-2 years. High blood pressure causes heart disease and increases the risk of stroke.  If you are between 75 years and 65 years old, ask your health care provider if you should take aspirin to prevent strokes.  Have regular diabetes screenings. This involves taking a blood sample to check your fasting blood sugar level.  If you are at a normal weight and have a low risk for diabetes, have this test once every three years after 62 years of age.  If you are overweight and have a high risk for diabetes, consider being tested at a younger age or more often. PREVENTING INFECTION  Hepatitis B  If you have a higher risk for hepatitis B, you should be screened for this virus. You are considered at high risk for hepatitis B if:  You were born in a country where hepatitis B is common. Ask your health care provider which countries are considered high risk.  Your parents were born in a high-risk country, and you have not been immunized against hepatitis B (hepatitis B vaccine).  You have HIV or AIDS.  You use needles to inject street drugs.  You live with someone who has hepatitis B.  You have had sex with someone who has hepatitis B.  You get hemodialysis treatment.  You take certain medicines for conditions, including cancer, organ transplantation, and autoimmune conditions. Hepatitis  C  Blood testing is recommended for:  Everyone born from [birth year] through 1963.  Anyone with known risk factors for hepatitis C. Sexually transmitted infections (STIs)  You should be screened for sexually transmitted infections (STIs) including gonorrhea and chlamydia if:  You are sexually active and are younger than 62 years of age.  You are older than 62 years of age and your health care provider tells you that you are at risk for this type of infection.  Your sexual activity has changed since you were last screened and you are at an increased risk for chlamydia or gonorrhea. Ask your health care provider if you are at risk.  If you do not have HIV, but are at risk, it may be recommended that you take a prescription medicine daily to prevent HIV infection. This is called pre-exposure prophylaxis (PrEP). You are considered at risk if:  You are sexually active and do not regularly use condoms or know the HIV status of your partner(s).  You take drugs by injection.  You are sexually active with a partner who has HIV. Talk with your health care provider about whether you are at high risk of being infected with HIV. If you choose to begin PrEP, you should first be tested for HIV. You should then be tested every 3 months for as long as you are taking PrEP.  PREGNANCY   If you are premenopausal and you may become pregnant, ask your health care provider about preconception counseling.  If you may become pregnant, take 400 to 800 micrograms (mcg) of folic acid every day.  If you want to prevent  pregnancy, talk to your health care provider about birth control (contraception). OSTEOPOROSIS AND MENOPAUSE   Osteoporosis is a disease in which the bones lose minerals and strength with aging. This can result in serious bone fractures. Your risk for osteoporosis can be identified using a bone density scan.  If you are 76 years of age or older, or if you are at risk for osteoporosis and fractures,  ask your health care provider if you should be screened.  Ask your health care provider whether you should take a calcium or vitamin D supplement to lower your risk for osteoporosis.  Menopause may have certain physical symptoms and risks.  Hormone replacement therapy may reduce some of these symptoms and risks. Talk to your health care provider about whether hormone replacement therapy is right for you.  HOME CARE INSTRUCTIONS   Schedule regular health, dental, and eye exams.  Stay current with your immunizations.   Do not use any tobacco products including cigarettes, chewing tobacco, or electronic cigarettes.  If you are pregnant, do not drink alcohol.  If you are breastfeeding, limit how much and how often you drink alcohol.  Limit alcohol intake to no more than 1 drink per day for nonpregnant women. One drink equals 12 ounces of beer, 5 ounces of wine, or 1 ounces of hard liquor.  Do not use street drugs.  Do not share needles.  Ask your health care provider for help if you need support or information about quitting drugs.  Tell your health care provider if you often feel depressed.  Tell your health care provider if you have ever been abused or do not feel safe at home. Document Released: 09/01/2010 Document Revised: 07/03/2013 Document Reviewed: 01/18/2013 [facility] Patient Information 2015 [place], [place]. This information is not intended to replace advice given to you by your health care provider. Make sure you discuss any questions you have with your health care provider.     Influenza Vaccine (Flu Vaccine, Inactivated or Recombinant) 2014-2015: What You Need to Know 1. Why get vaccinated? Influenza ("flu") is a contagious disease that spreads around the [place] every winter, usually between October and May. Flu is caused by influenza viruses, and is spread mainly by coughing, sneezing, and close contact. Anyone can get flu, but the risk of getting flu is  highest among children. Symptoms come on suddenly and may last several days. They can include:  fever/chills  sore throat  muscle aches  fatigue  cough  headache  runny or stuffy nose Flu can make some people much sicker than others. These people include young children, people 49 and older, pregnant women, and people with certain health conditions-such as heart, lung or kidney disease, nervous system disorders, or a weakened immune system. Flu vaccination is especially important for these people, and anyone in close contact with them. Flu can also lead to pneumonia, and make existing medical conditions worse. It can cause diarrhea and seizures in children. Each year thousands of people in the [place] die from flu, and many more are hospitalized. Flu vaccine is the best protection against flu and its complications. Flu vaccine also helps prevent spreading flu from person to person. 2. Inactivated and recombinant flu vaccines You are getting an injectable flu vaccine, which is either an "inactivated" or "recombinant" vaccine. These vaccines do not contain any live influenza virus. They are given by injection with a needle, and often called the "flu shot."  A different live, attenuated (weakened) influenza vaccine is sprayed into  the nostrils. This vaccine is described in a separate Vaccine Information Statement. Flu vaccination is recommended every year. Some children 6 months through 74 years of age might need two doses during one year. Flu viruses are always changing. Each year's flu vaccine is made to protect against 3 or 4 viruses that are likely to cause disease that year. Flu vaccine cannot prevent all cases of flu, but it is the best defense against the disease.  It takes about 2 weeks for protection to develop after the vaccination, and protection lasts several months to a year. Some illnesses that are not caused by influenza virus are often mistaken for flu. Flu vaccine will not  prevent these illnesses. It can only prevent influenza. Some inactivated flu vaccine contains a very small amount of a mercury-based preservative called thimerosal. Studies have shown that thimerosal in vaccines is not harmful, but flu vaccines that do not contain a preservative are available. 3. Some people should not get this vaccine Tell the person who gives you the vaccine:  If you have any severe, life-threatening allergies. If you ever had a life-threatening allergic reaction after a dose of flu vaccine, or have a severe allergy to any part of this vaccine, including (for example) an allergy to gelatin, antibiotics, or eggs, you may be advised not to get vaccinated. Most, but not all, types of flu vaccine contain a small amount of egg protein.  If you ever had Guillain-Barr Syndrome (a severe paralyzing illness, also called GBS). Some people with a history of GBS should not get this vaccine. This should be discussed with your doctor.  If you are not feeling well. It is usually okay to get flu vaccine when you have a mild illness, but you might be advised to wait until you feel better. You should come back when you are better. 4. Risks of a vaccine reaction With a vaccine, like any medicine, there is a chance of side effects. These are usually mild and go away on their own. Problems that could happen after any vaccine:  Brief fainting spells can happen after any medical procedure, including vaccination. Sitting or lying down for about 15 minutes can help prevent fainting, and injuries caused by a fall. Tell your doctor if you feel dizzy, or have vision changes or ringing in the ears.  Severe shoulder pain and reduced range of motion in the arm where a shot was given can happen, very rarely, after a vaccination.  Severe allergic reactions from a vaccine are very rare, estimated at less than 1 in a million doses. If one were to occur, it would usually be within a few minutes to a few hours  after the vaccination. Mild problems following inactivated flu vaccine:  soreness, redness, or swelling where the shot was given  hoarseness  sore, red or itchy eyes  cough  fever  aches  headache  itching  fatigue If these problems occur, they usually begin soon after the shot and last 1 or 2 days. Moderate problems following inactivated flu vaccine:  Young children who get inactivated flu vaccine and pneumococcal vaccine (PCV13) at the same time may be at increased risk for seizures caused by fever. Ask your doctor for more information. Tell your doctor if a child who is getting flu vaccine has ever had a seizure. Inactivated flu vaccine does not contain live flu virus, so you cannot get the flu from this vaccine. As with any medicine, there is a very remote chance of a vaccine  causing a serious injury or death. The safety of vaccines is always being monitored. For more information, visit: http://www.aguilar.org/ 5. What if there is a serious reaction? What should I look for?  Look for anything that concerns you, such as signs of a severe allergic reaction, very high fever, or behavior changes. Signs of a severe allergic reaction can include hives, swelling of the face and throat, difficulty breathing, a fast heartbeat, dizziness, and weakness. These would start a few minutes to a few hours after the vaccination. What should I do?  If you think it is a severe allergic reaction or other emergency that can't wait, call 9-1-1 and get the person to the nearest hospital. Otherwise, call your doctor.  Afterward, the reaction should be reported to the Vaccine Adverse Event Reporting System (VAERS). Your doctor should file this report, or you can do it yourself through the VAERS web site at www.vaers.SamedayNews.es, or by calling 669-351-9625. VAERS does not give medical advice. 6. The National Vaccine Injury Compensation Program The [employer] Vaccine Injury Compensation Program (VICP)  is a federal program that was created to compensate people who may have been injured by certain vaccines. Persons who believe they may have been injured by a vaccine can learn about the program and about filing a claim by calling 438-179-8854 or visiting the [place] website at GoldCloset.com.ee. There is a time limit to file a claim for compensation. 7. How can I learn more?  Ask your health care provider.  Call your local or state health department.  Contact the Centers for Disease Control and Prevention (CDC):  Call 204-857-4586 (1-800-CDC-INFO) or  Visit CDC's website at https://gibson.com/ CDC Vaccine Information Statement (Interim) Inactivated Influenza Vaccine (10/18/2012) Document Released: 12/11/2005 Document Revised: 07/03/2013 Document Reviewed: 02/03/2013 [facility] Patient Information 2015 [place]. This information is not intended to replace advice given to you by your health care provider. Make sure you discuss any questions you have with your health care provider.     Standley Brooking. Panosh M.D.

## 2014-11-29 ENCOUNTER — Other Ambulatory Visit (INDEPENDENT_AMBULATORY_CARE_PROVIDER_SITE_OTHER): Payer: BLUE CROSS/BLUE SHIELD

## 2014-11-29 DIAGNOSIS — E785 Hyperlipidemia, unspecified: Secondary | ICD-10-CM | POA: Diagnosis not present

## 2014-11-29 DIAGNOSIS — Z79899 Other long term (current) drug therapy: Secondary | ICD-10-CM

## 2014-11-29 DIAGNOSIS — I10 Essential (primary) hypertension: Secondary | ICD-10-CM

## 2014-11-29 DIAGNOSIS — R252 Cramp and spasm: Secondary | ICD-10-CM

## 2014-11-29 LAB — LIPID PANEL
Cholesterol: 189 mg/dL (ref 0–200)
HDL: 57.7 mg/dL (ref >39.00)
LDL Cholesterol: 103 mg/dL — ABNORMAL HIGH (ref 0–99)
NonHDL: 131.78
Total CHOL/HDL Ratio: 3
Triglycerides: 145 mg/dL (ref 0.0–149.0)
VLDL: 29 mg/dL (ref 0.0–40.0)

## 2014-11-29 LAB — CBC WITH DIFFERENTIAL/PLATELET
Basophils Absolute: 0.1 10*3/uL (ref 0.0–0.1)
Basophils Relative: 1 % (ref 0.0–3.0)
Eosinophils Absolute: 0.1 10*3/uL (ref 0.0–0.7)
Eosinophils Relative: 2.5 % (ref 0.0–5.0)
HCT: 32.3 % — ABNORMAL LOW (ref 36.0–46.0)
Hemoglobin: 10.5 g/dL — ABNORMAL LOW (ref 12.0–15.0)
Lymphocytes Relative: 30.7 % (ref 12.0–46.0)
Lymphs Abs: 1.6 10*3/uL (ref 0.7–4.0)
MCHC: 32.6 g/dL (ref 30.0–36.0)
MCV: 82.7 fl (ref 78.0–100.0)
Monocytes Absolute: 0.3 10*3/uL (ref 0.1–1.0)
Monocytes Relative: 5.6 % (ref 3.0–12.0)
Neutro Abs: 3.1 10*3/uL (ref 1.4–7.7)
Neutrophils Relative %: 60.2 % (ref 43.0–77.0)
Platelets: 406 10*3/uL — ABNORMAL HIGH (ref 150.0–400.0)
RBC: 3.9 Mil/uL (ref 3.87–5.11)
RDW: 14.5 % (ref 11.5–15.5)
WBC: 5.1 10*3/uL (ref 4.0–10.5)

## 2014-11-29 LAB — COMPLETE METABOLIC PANEL WITH GFR
ALT: 18 U/L (ref 6–29)
AST: 18 U/L (ref 10–35)
Albumin: 4.2 g/dL (ref 3.6–5.1)
Alkaline Phosphatase: 99 U/L (ref 33–130)
BUN: 17 mg/dL (ref 7–25)
CO2: 28 mmol/L (ref 20–31)
Calcium: 9.6 mg/dL (ref 8.6–10.4)
Chloride: 108 mmol/L (ref 98–110)
Creat: 0.79 mg/dL (ref 0.50–0.99)
GFR, Est African American: >89 mL/min (ref >=60)
GFR, Est Non African American: 80 mL/min (ref >=60)
Glucose, Bld: 106 mg/dL — ABNORMAL HIGH (ref 65–99)
Potassium: 5.4 mmol/L — ABNORMAL HIGH (ref 3.5–5.3)
Sodium: 143 mmol/L (ref 135–146)
Total Bilirubin: 0.7 mg/dL (ref 0.2–1.2)
Total Protein: 7.1 g/dL (ref 6.1–8.1)

## 2014-11-29 LAB — MAGNESIUM: Magnesium: 2.1 mg/dL (ref 1.5–2.5)

## 2014-11-29 LAB — TB SKIN TEST: TB Skin Test: NEGATIVE

## 2014-11-29 LAB — HEMOGLOBIN A1C: Hgb A1c MFr Bld: 6.1 % (ref 4.6–6.5)

## 2014-11-29 LAB — TSH: TSH: 1.31 u[IU]/mL (ref 0.35–4.50)

## 2014-11-30 ENCOUNTER — Ambulatory Visit (INDEPENDENT_AMBULATORY_CARE_PROVIDER_SITE_OTHER): Payer: BLUE CROSS/BLUE SHIELD

## 2014-11-30 ENCOUNTER — Ambulatory Visit (INDEPENDENT_AMBULATORY_CARE_PROVIDER_SITE_OTHER): Payer: BLUE CROSS/BLUE SHIELD | Admitting: Podiatry

## 2014-11-30 ENCOUNTER — Encounter: Payer: Self-pay | Admitting: Obstetrics & Gynecology

## 2014-11-30 DIAGNOSIS — M205X9 Other deformities of toe(s) (acquired), unspecified foot: Secondary | ICD-10-CM

## 2014-11-30 DIAGNOSIS — Z9889 Other specified postprocedural states: Secondary | ICD-10-CM

## 2014-11-30 DIAGNOSIS — M21629 Bunionette of unspecified foot: Secondary | ICD-10-CM

## 2014-11-30 DIAGNOSIS — M2012 Hallux valgus (acquired), left foot: Secondary | ICD-10-CM | POA: Diagnosis not present

## 2014-12-03 ENCOUNTER — Telehealth: Payer: Self-pay | Admitting: *Deleted

## 2014-12-03 NOTE — Telephone Encounter (Addendum)
Pt states she is having severe pain at night and through the day it isn't a lot better.  Pt states the sharp stabbing pain is at night, but has burning through the day.  I asked pt if she was still elevating and night, she states she either has it elevated or level when it hurts, that wakes her out of sleep, even with the pain medications. Pt states the sharp pain shoots across her foot. I told pt not to elevate the foot except in period of visible swelling, and to keep activity down for the next week or so.  I instructed pt to loosen the dressings.  I told pt I would call again with information from Dr. Jacqualyn Posey.  Pt states she will continue to ice and elevate, has plenty of pain medication, hasn't started the pain medication for this foot, denies, fever, chills, nausea or vomiting.  Pt states she did have a physical and she is anemic.  Left message informing pt of Dr. Leigh Aurora updated instructions.  Pt stated she did not feel comfortable removing and redressing the surgery foot and did have relief last night after loosening the dressing and taking old pain medication for her back.

## 2014-12-03 NOTE — Progress Notes (Signed)
Patient ID: Jade Cardenas, female   DOB: [birth year], 64 y.o.   MRN: [id number]  DOS: Left Tailors bunionectomy 11/21/14  Subjective: Jade Cardenas presents the office today one week status post left foot tailors bunionectomy. She states that she is having slightly more discomfort on the left side compared to the right side. She continues in the surgical shoe. She does admit that she is up very active on her feet. She has decreased the amount of pain medicine she has been taking since the surgery as the pain does appear to be improving. She is doing well to the right side. She denies any systemic complaints as fevers, chills, nausea, vomiting. Denies any calf pain, chest pain, shortness of breath. No other complaints at this time.  Objective: AAO 3, NAD Neurovascular status intact Incisional the dorsal lateral portion of the left foot as well coapted with sutures intact. There is no evidence of dehiscence. There is no surrounding erythema, ascending cellulitis, fluctuance, crepitus, malodor, drainage/purulence. There is mild edema to the surgical site. Mild tenderness to palpation along the surgical site as well. No pain with MPJ range of motion. Scar overlying the right foot recent tailors bunion is well-healed. There is no tenderness palpation along the surgical site. No open lesions or pre-ulcerative lesions. No pain with calf compression, swelling, warmth, erythema.  Assessment: 64 year old female status post left tailors bunionectomy, doing well  Plan: -Treatment options discussed including all alternatives, risks, and complications -X-rays were obtained and reviewed with the patient.  -Antibiotic ointmentover the incision on the left side followed by dry sterile dressing. Keep dressing clean, dry, intact. -Continue surgical shoe at all times. -Limit activity/weightbearing. -Ice and elevation. -Continue supportive shoes on the right side. -Monitor for any clinical signs or symptoms of  infection and directed to call the office immediately should any occur or go to the ER.  Celesta Gentile, DPM

## 2014-12-03 NOTE — Telephone Encounter (Signed)
Continue to ice and elevate. We can increase her pain meds some if she needs. Any fevers, chills, nausea, vomiting?

## 2014-12-03 NOTE — Telephone Encounter (Signed)
If she wants she can change the dressing and apply antibiotic ointment and a new dressing. She can come in to see me this week as well. Thanks.

## 2014-12-04 ENCOUNTER — Encounter: Payer: Self-pay | Admitting: Internal Medicine

## 2014-12-04 ENCOUNTER — Telehealth: Payer: Self-pay | Admitting: *Deleted

## 2014-12-04 NOTE — Telephone Encounter (Signed)
I sent to Dr. August Luz.

## 2014-12-04 NOTE — Telephone Encounter (Signed)
dont have to stop eating bananas  The results could be result of  Lab draw effect   Or sometimes see this with bp medication .or use of antinflammtories   Suggest good hydration and do    Bmp

## 2014-12-04 NOTE — Telephone Encounter (Signed)
Pt states she's not comfortable changing the dressing before the appt, but wanted to make certain the appt was still on for Friday.  I told pt the appt was 12/07/2014 at 900am.

## 2014-12-05 NOTE — Telephone Encounter (Signed)
Entered in error

## 2014-12-07 ENCOUNTER — Ambulatory Visit (INDEPENDENT_AMBULATORY_CARE_PROVIDER_SITE_OTHER): Payer: BLUE CROSS/BLUE SHIELD | Admitting: Podiatry

## 2014-12-07 ENCOUNTER — Encounter: Payer: Self-pay | Admitting: Podiatry

## 2014-12-07 VITALS — BP 128/69 | HR 72 | Resp 12

## 2014-12-07 DIAGNOSIS — Z9889 Other specified postprocedural states: Secondary | ICD-10-CM

## 2014-12-07 DIAGNOSIS — M21629 Bunionette of unspecified foot: Secondary | ICD-10-CM

## 2014-12-07 NOTE — Progress Notes (Signed)
Patient ID: TERRELL SHIMKO, female   DOB: 05/06/1950, 64 y.o.   MRN: [id number]  Subjective: 64 year old female presents the office today for postop visit #2 left foot. She presents today for suture removal. She states that she has been having pain more so on the surgery digit of the right side. She does continue the pain medicine mostly at night. Her pain has been decreasing. She continues with surgical shoe devices Her foot as much as possible. She does ice intermittently. She denies any systemic complaints as fevers, chills, nausea, vomiting. No calf pain, chest pain, shortness of breath. No other complaints at this time. No acute changes otherwise. She says her right side is doing well and she is able to wear regular shoe without any difficulties.  Objective: AAO 3, NAD Neurovascular status intact and unchanged Incisional a dorsal lateral aspect the left foot on the Taylor's bunion as well coapted without any evidence of dehiscence and sutures are intact. There is no surrounding erythema, ascending cellulitis, fluctuance, crepitus, malodor, drainage/purulence. There is mild to palpation about surgical site. No pain with MPJ range of motion. There is mild edema along the surgical site. There is no other areas tenderness to bilateral lower x-rays. Incision on the right as well coapted and there is no pain along the surgical site. No pain with compression, swelling, warmth, erythema.  Assessment: 64 year old female postop visit #2 status post left tailors bunionectomy  Plan: -Treatment options discussed including all alternatives, risks, and complications -Sutures removed on the left foot without, occasions. Steri-Strips are applied followed by antibiotic ointment and a Band-Aid. Continue with daily dressing changes. She can start to shower tomorrow as long as the incision remains closed. Physical incision off on showering call the office. -Continue a surgical shoe at all times. - Ice and  elevation. -Pain medication as needed. -Monitor for any clinical signs or symptoms of infection and directed to call the office immediately should any occur or go to the ER. -Follow-up in 2 weeks or sooner if any problems arise. In the meantime, encouraged to call the office with any questions, concerns, change in symptoms.  *x-ray bilateral feet next appointment   Celesta Gentile, DPM

## 2014-12-21 ENCOUNTER — Ambulatory Visit (INDEPENDENT_AMBULATORY_CARE_PROVIDER_SITE_OTHER): Payer: BLUE CROSS/BLUE SHIELD

## 2014-12-21 ENCOUNTER — Ambulatory Visit (INDEPENDENT_AMBULATORY_CARE_PROVIDER_SITE_OTHER): Payer: BLUE CROSS/BLUE SHIELD | Admitting: Podiatry

## 2014-12-21 ENCOUNTER — Ambulatory Visit: Payer: BLUE CROSS/BLUE SHIELD

## 2014-12-21 ENCOUNTER — Encounter: Payer: Self-pay | Admitting: Podiatry

## 2014-12-21 VITALS — BP 120/56 | HR 107 | Resp 18

## 2014-12-21 DIAGNOSIS — Z9889 Other specified postprocedural states: Secondary | ICD-10-CM

## 2014-12-21 DIAGNOSIS — M205X2 Other deformities of toe(s) (acquired), left foot: Secondary | ICD-10-CM

## 2014-12-21 DIAGNOSIS — M21629 Bunionette of unspecified foot: Secondary | ICD-10-CM

## 2014-12-27 NOTE — Progress Notes (Signed)
Patient ID: Jade Cardenas, female   DOB: 01-01-1951, 64 y.o.   MRN: [id number]  Subjective: Patient recently office a follow-up evaluation status post left tailors bunionectomy. She states overall she is doing well and her pain is significantly improved. She's continue with a surgical shoe. She is decreased amount of pain medication she was taking. She denies any systemic complaints such as fevers, chills, nausea, vomiting. No calf pain, chest pain, shortness of breath. No other complaints at this time in no acute changes otherwise. She can use nightly the right foot she's not having any pain along the surgical site there.  Objective: AAO 3, NAD DP/PT pulses palpable, CRT less than 3 seconds Protective sensation appears to be intact with [name] monofilament Incision from the dorsal lateral aspect of the left foot  Along the distal portion of the fifth metatarsals well coapted and the scars formed. There is minimal tenderness palpation of the surgical site. There is minimal edema without any associated erythema or increase in warmth. Scars well 4 on the right side there is no tenderness on the surgical site. No other areas of tenderness bilateral lower extremities. There is no overlying edema, erythema, increased warmth bilaterally. No pain with calf compression, swelling, warmth, erythema.  Assessment: 64 year old female status post left tailors bunionectomy, doing well  Plan: -X-rays were obtained and reviewed with the patient.  -Treatment options discussed including all alternatives, risks, and complications -At this time she is asking to return to regular shoe. She is healing well she can return to regular, supportive sneaker. If there is any increase in pain she needs to return to the surgical shoe. Hold off on any exercising or high-impact activities. - Continue ice and elevation. - Pain medication as needed -Monitor for any clinical signs or symptoms of infection and directed  to call the office immediately should any occur or go to the ER. -Follow-up as secheduled or sooner if any problems arise. In the meantime, encouraged to call the office with any questions, concerns, change in symptoms.   Celesta Gentile, DPM

## 2015-01-08 ENCOUNTER — Telehealth: Payer: Self-pay | Admitting: *Deleted

## 2015-01-09 NOTE — Telephone Encounter (Signed)
Pt request Voltaren gel rx.  Left message informing pt our office did not prescribe the Voltaren gel and she would benefit from contacting the doctor that originally prescribed.

## 2015-01-11 ENCOUNTER — Encounter: Payer: Self-pay | Admitting: Podiatry

## 2015-01-11 ENCOUNTER — Ambulatory Visit (INDEPENDENT_AMBULATORY_CARE_PROVIDER_SITE_OTHER): Payer: BLUE CROSS/BLUE SHIELD | Admitting: Podiatry

## 2015-01-11 ENCOUNTER — Ambulatory Visit (INDEPENDENT_AMBULATORY_CARE_PROVIDER_SITE_OTHER): Payer: BLUE CROSS/BLUE SHIELD

## 2015-01-11 VITALS — BP 115/59 | HR 109 | Resp 18

## 2015-01-11 DIAGNOSIS — M205X2 Other deformities of toe(s) (acquired), left foot: Secondary | ICD-10-CM | POA: Diagnosis not present

## 2015-01-11 DIAGNOSIS — M205X1 Other deformities of toe(s) (acquired), right foot: Secondary | ICD-10-CM

## 2015-01-11 DIAGNOSIS — Z9889 Other specified postprocedural states: Secondary | ICD-10-CM

## 2015-01-18 DIAGNOSIS — M21622 Bunionette of left foot: Secondary | ICD-10-CM | POA: Insufficient documentation

## 2015-01-18 DIAGNOSIS — M205X2 Other deformities of toe(s) (acquired), left foot: Secondary | ICD-10-CM | POA: Insufficient documentation

## 2015-01-18 NOTE — Progress Notes (Signed)
Patient ID: Jade Cardenas, female   DOB: 1950/06/20, 64 y.o.   MRN: [id number]  Subjective: Jade Cardenas is a 64 y.o. is seen today in office s/p left tailors bunion and previously a right tailors bunionectomy. They state their pain is improving but does continue somewhat on the left side. She gets some occasional swelling to the left side however the right side is doing fine. She is wearing a regular shoe. She is back to performing daily activities. She does get some occasional discomfort after she has been on her feet for quite some time. Denies any systemic complaints such as fevers, chills, nausea, vomiting. No calf pain, chest pain, shortness of breath.   Objective: General: No acute distress, AAOx3  DP/PT pulses palpable 2/4, CRT < 3 sec to all digits.  Protective sensation intact. Motor function intact.  Left and Right foot: Incision is well coapted without any evidence of dehiscence and a scar has formed. There is no surrounding erythema, ascending cellulitis, fluctuance, crepitus, malodor, drainage/purulence. There is trace edema around the surgical site on the left. There is very mild and decreased pain along the surgical site on the left but no pain on the right. No pain with MTPJ ROM.  No other areas of tenderness to bilateral lower extremities.  No other open lesions or pre-ulcerative lesions.  No pain with calf compression, swelling, warmth, erythema.   Assessment and Plan:  Status post bilateral foot surgyer, doing well with no complications   -Treatment options discussed including all alternatives, risks, and complications -Ice/elevation -Pain medication as needed. -Continue with regular shoegear as tolerated.  -Cocoa butter or Vitamin E cream daily -Monitor for any clinical signs or symptoms of infection and DVT/PE and directed to call the office immediately should any occur or go to the ER. -Follow-up as scheduled or sooner if any problems arise. In the meantime,  encouraged to call the office with any questions, concerns, change in symptoms.   Celesta Gentile, DPM

## 2015-02-04 ENCOUNTER — Encounter: Payer: Self-pay | Admitting: Internal Medicine

## 2015-02-17 ENCOUNTER — Other Ambulatory Visit: Payer: Self-pay | Admitting: Internal Medicine

## 2015-02-18 NOTE — Telephone Encounter (Signed)
Sent to the pharmacy by e-scribe.  Pt has upcoming appt on 05/24/15

## 2015-03-01 ENCOUNTER — Telehealth: Payer: Self-pay | Admitting: *Deleted

## 2015-03-01 ENCOUNTER — Encounter: Payer: Self-pay | Admitting: Podiatry

## 2015-03-01 ENCOUNTER — Ambulatory Visit (INDEPENDENT_AMBULATORY_CARE_PROVIDER_SITE_OTHER): Payer: BLUE CROSS/BLUE SHIELD | Admitting: Podiatry

## 2015-03-01 VITALS — BP 111/50 | HR 112 | Resp 18

## 2015-03-01 DIAGNOSIS — Z9889 Other specified postprocedural states: Secondary | ICD-10-CM

## 2015-03-01 DIAGNOSIS — M205X2 Other deformities of toe(s) (acquired), left foot: Secondary | ICD-10-CM

## 2015-03-01 DIAGNOSIS — M205X1 Other deformities of toe(s) (acquired), right foot: Secondary | ICD-10-CM

## 2015-03-01 MED ORDER — NONFORMULARY OR COMPOUNDED ITEM: Status: DC

## 2015-03-01 NOTE — Telephone Encounter (Signed)
Dr. Jacqualyn Posey ordered antiinflammatory cream from [facility].  Faxed.

## 2015-03-05 NOTE — Progress Notes (Signed)
Patient ID: DAINTY LUTTRULL, female   DOB: 02/26/1951, 65 y.o.   MRN: [id number]  Subjective: Jade Cardenas is a 65 y.o. is seen today in office s/p left tailors bunion and previously a right tailors bunionectomy. She does that she gets intermittent discomfort almost surgical site of the left foot and she describes is a sharp sensation and this is intermittent in nature and not on daily basis. She is able to wear regular shoe without any difficulties. Denies any systemic complaints as fevers, chills, nausea, vomiting. No calf pain, chest pain, shortness of breath. No other complaints at this time.  Objective: General: No acute distress, AAOx3  DP/PT pulses palpable 2/4, CRT < 3 sec to all digits.  Protective sensation intact. Motor function intact.  Left and Right foot: Incision is well coapted without any evidence of dehiscence and a scar has formed. There is no surrounding erythema, ascending cellulitis, fluctuance, crepitus, malodor, drainage/purulence. There is trace edema around the surgical site on the left but appears improved. There is currently no tenderness to palpation with surgical sites at this time. No pain with MPJ range of motion. There is no prominent hardware present.  No other areas of tenderness to bilateral lower extremities.  No other open lesions or pre-ulcerative lesions.  No pain with calf compression, swelling, warmth, erythema.   Assessment and Plan:  Status post bilateral foot surgyer, doing well with no complications   -Treatment options discussed including all alternatives, risks, and complications -Ice/elevation -Continue with regular shoegear as tolerated.  -Cocoa butter or Vitamin E cream daily -Ordered compound cream to help with inflammation as well as the sensation she is having on the left side. -Monitor for any clinical signs or symptoms of infection and DVT/PE and directed to call the office immediately should any occur or go to the ER. -Follow-up if  symptoms continue or worsen  or sooner if any problems arise. In the meantime, encouraged to call the office with any questions, concerns, change in symptoms.   Celesta Gentile, DPM

## 2015-04-19 ENCOUNTER — Encounter: Payer: Self-pay | Admitting: Internal Medicine

## 2015-04-19 DIAGNOSIS — Z8639 Personal history of other endocrine, nutritional and metabolic disease: Secondary | ICD-10-CM

## 2015-04-19 DIAGNOSIS — D649 Anemia, unspecified: Secondary | ICD-10-CM

## 2015-04-19 NOTE — Telephone Encounter (Signed)
We can certainly refer for hematology opinion.   Misty please send in referral  For dx persistant anemia     Hx of  Iron deficincy

## 2015-04-26 ENCOUNTER — Other Ambulatory Visit: Payer: Self-pay | Admitting: Neurosurgery

## 2015-04-26 DIAGNOSIS — M5412 Radiculopathy, cervical region: Secondary | ICD-10-CM

## 2015-05-06 ENCOUNTER — Telehealth: Payer: Self-pay | Admitting: Hematology

## 2015-05-06 NOTE — Telephone Encounter (Signed)
Pt is aware of np appt. On 05/22/15@1 :25 Referring [place]

## 2015-05-08 ENCOUNTER — Ambulatory Visit
Admission: RE | Admit: 2015-05-08 | Discharge: 2015-05-08 | Disposition: A | Discharge status: Home / Self Care | Payer: BLUE CROSS/BLUE SHIELD | Source: Ambulatory Visit | Attending: Neurosurgery | Admitting: Neurosurgery

## 2015-05-08 VITALS — BP 136/61 | HR 101

## 2015-05-08 DIAGNOSIS — M5412 Radiculopathy, cervical region: Secondary | ICD-10-CM

## 2015-05-08 DIAGNOSIS — Z981 Arthrodesis status: Secondary | ICD-10-CM

## 2015-05-08 DIAGNOSIS — M48062 Spinal stenosis, lumbar region with neurogenic claudication: Secondary | ICD-10-CM

## 2015-05-08 DIAGNOSIS — M4802 Spinal stenosis, cervical region: Secondary | ICD-10-CM

## 2015-05-08 MED ORDER — DIAZEPAM 5 MG PO TABS
10.0000 mg | ORAL_TABLET | Freq: Once | ORAL | Status: AC
  Administered 2015-05-08: 10 mg via ORAL

## 2015-05-08 MED ORDER — IOHEXOL 300 MG/ML  SOLN
10.0000 mL | Freq: Once | INTRAMUSCULAR | Status: AC | PRN
  Administered 2015-05-08: 10 mL via INTRATHECAL

## 2015-05-08 NOTE — Progress Notes (Signed)
Patient states she has taken her 13-hour prep as prescribed. Brita Romp, RN

## 2015-05-08 NOTE — Discharge Instructions (Signed)

## 2015-05-20 ENCOUNTER — Encounter: Payer: Self-pay | Admitting: Internal Medicine

## 2015-05-22 ENCOUNTER — Other Ambulatory Visit (HOSPITAL_BASED_OUTPATIENT_CLINIC_OR_DEPARTMENT_OTHER): Payer: BLUE CROSS/BLUE SHIELD

## 2015-05-22 ENCOUNTER — Other Ambulatory Visit: Payer: Self-pay | Admitting: *Deleted

## 2015-05-22 ENCOUNTER — Ambulatory Visit (HOSPITAL_BASED_OUTPATIENT_CLINIC_OR_DEPARTMENT_OTHER): Payer: BLUE CROSS/BLUE SHIELD | Admitting: Hematology

## 2015-05-22 ENCOUNTER — Telehealth: Payer: Self-pay | Admitting: Hematology

## 2015-05-22 ENCOUNTER — Encounter: Payer: Self-pay | Admitting: Hematology

## 2015-05-22 VITALS — BP 142/61 | HR 113 | Temp 98.1°F | Resp 18 | Ht 64.0 in | Wt 219.8 lb

## 2015-05-22 DIAGNOSIS — E611 Iron deficiency: Secondary | ICD-10-CM | POA: Diagnosis not present

## 2015-05-22 DIAGNOSIS — E559 Vitamin D deficiency, unspecified: Secondary | ICD-10-CM

## 2015-05-22 DIAGNOSIS — D509 Iron deficiency anemia, unspecified: Secondary | ICD-10-CM | POA: Insufficient documentation

## 2015-05-22 DIAGNOSIS — E538 Deficiency of other specified B group vitamins: Secondary | ICD-10-CM

## 2015-05-22 DIAGNOSIS — D649 Anemia, unspecified: Secondary | ICD-10-CM | POA: Diagnosis not present

## 2015-05-22 DIAGNOSIS — Z800 Family history of malignant neoplasm of digestive organs: Secondary | ICD-10-CM | POA: Diagnosis not present

## 2015-05-22 DIAGNOSIS — Z808 Family history of malignant neoplasm of other organs or systems: Secondary | ICD-10-CM

## 2015-05-22 LAB — CBC & DIFF AND RETIC
BASO%: 0.4 % (ref 0.0–2.0)
Basophils Absolute: 0 10*3/uL (ref 0.0–0.1)
EOS%: 1.1 % (ref 0.0–7.0)
Eosinophils Absolute: 0.1 10*3/uL (ref 0.0–0.5)
HCT: 24.5 % — ABNORMAL LOW (ref 34.8–46.6)
HGB: 7.2 g/dL — ABNORMAL LOW (ref 11.6–15.9)
Immature Retic Fract: 24.1 % — ABNORMAL HIGH (ref 1.60–10.00)
LYMPH%: 25.8 % (ref 14.0–49.7)
MCH: 21 pg — ABNORMAL LOW (ref 25.1–34.0)
MCHC: 29.4 g/dL — ABNORMAL LOW (ref 31.5–36.0)
MCV: 71.4 fL — ABNORMAL LOW (ref 79.5–101.0)
MONO#: 0.3 10*3/uL (ref 0.1–0.9)
MONO%: 4.3 % (ref 0.0–14.0)
NEUT#: 5.1 10*3/uL (ref 1.5–6.5)
NEUT%: 68.4 % (ref 38.4–76.8)
Platelets: 393 10*3/uL (ref 145–400)
RBC: 3.43 10*6/uL — ABNORMAL LOW (ref 3.70–5.45)
RDW: 17 % — ABNORMAL HIGH (ref 11.2–14.5)
Retic %: 1.85 % (ref 0.70–2.10)
Retic Ct Abs: 63.46 10*3/uL (ref 33.70–90.70)
WBC: 7.4 10*3/uL (ref 3.9–10.3)
lymph#: 1.9 10*3/uL (ref 0.9–3.3)

## 2015-05-22 LAB — COMPREHENSIVE METABOLIC PANEL
ALT: 13 U/L (ref 0–55)
AST: 14 U/L (ref 5–34)
Albumin: 3.8 g/dL (ref 3.5–5.0)
Alkaline Phosphatase: 102 U/L (ref 40–150)
Anion Gap: 9 mEq/L (ref 3–11)
BUN: 18.9 mg/dL (ref 7.0–26.0)
CO2: 25 mEq/L (ref 22–29)
Calcium: 9.4 mg/dL (ref 8.4–10.4)
Chloride: 107 mEq/L (ref 98–109)
Creatinine: 0.9 mg/dL (ref 0.6–1.1)
EGFR: 72 mL/min/{1.73_m2} — ABNORMAL LOW (ref >90)
Glucose: 125 mg/dl (ref 70–140)
Potassium: 4.1 mEq/L (ref 3.5–5.1)
Sodium: 141 mEq/L (ref 136–145)
Total Bilirubin: 0.3 mg/dL (ref 0.20–1.20)
Total Protein: 7.7 g/dL (ref 6.4–8.3)

## 2015-05-22 LAB — LACTATE DEHYDROGENASE: LDH: 208 U/L (ref 125–245)

## 2015-05-22 NOTE — Telephone Encounter (Signed)
Gave and printed appt sched and avs for pt for march, April and June

## 2015-05-22 NOTE — Progress Notes (Signed)
Jade Cardenas Kitchen    HEMATOLOGY/ONCOLOGY CONSULTATION NOTE  Date of Service: 05/22/2015  Patient Care Team: [name], MD as PCP - General Richmond Campbell, MD (Gastroenterology) Megan Salon, MD (Gynecology) Josue Hector, MD (Cardiology) Bo Merino, MD as Consulting Physician (Rheumatology)  CHIEF COMPLAINTS/PURPOSE OF CONSULTATION:  Fatigue and severe iron deficiency anemia Oral iron intolerance  HISTORY OF PRESENTING ILLNESS:   Jade Cardenas is a wonderful 65 y.o. female who has been referred to [place] by Dr Regis Bill  for evaluation and management of worsening anemia.  [name] has a history of hypertension, dyslipidemia, inflammation to arthritis (Rheumatoid vs psoariatic), adenomatous polyps, bleeding gastric ulcers.  She notes that she has received blood contusion as before due to gastrointestinal bleeding several years ago.    Patient has most recently been on Simponi for treatment of her inflammatory arthritis by her rheumatologist Dr Estanislado Pandy and notes that she received her last dose in the first week of March 2017.  She has previously been on other Biologics and on methotrexate.  She has noted to have developed progressive microcytic anemia over the last several months.  Her hemoglobin was 11.9 with normal MCV about 10 months ago and it was down to 10.5 about 5 months ago and most recently was noted to have dropped to 7.2 with an MCV 71.4 and an elevated RDW of 17.  She notes that she cannot even small amounts of oral iron due to significant GI distress especially with her IBS.   She notes that she is feeling significantly fatigued and has noticed a significant craving for eating celery.  She not noticed black stools or blood in the stools or other overt signs of GI bleeding.    She last had a colonoscopy in August 2013 which showed a 5 mm polyp in the right colon, diverticulosis, medium hemorrhoids which she reports were a bother with significant bleeding.  Hemorrhoidal  banding was recommended and done.  She also had an EGD in October 2013 which showed a healed antral gastric ulcer, erythema in the gastric antrum, gastric polyps in the gastric mid body and a large hiatal hernia. She has been on chronic PPI therapy.  This will probably also serve to reduce her oral iron absorption.   MEDICAL HISTORY:  Past Medical History  Diagnosis Date  . HYPERLIPIDEMIA 12/29/2006    takes Lovastatin nightly  . Right bundle branch block 02/09/2007  . IBS 04/28/2007  . MENOPAUSAL DISORDER 12/07/2007  . Rheumatoid arthritis(714.0) 12/29/2006  . SYNCOPE 12/07/2007  . WEIGHT GAIN 12/18/2008  . Palpitations 02/09/2007  . UNS ADVRS EFF OTH RX MEDICINAL\T\BIOLOGICAL SBSTNC 02/09/2007  . GASTRIC ULCER, ACUTE, HEMORRHAGE, HX OF 02/09/2007  . COLONIC POLYPS, HX OF 02/09/2007  . REDUCTION MAMMOPLASTY, HX OF 02/09/2007  . HYPERTENSION 12/29/2006    takes Enalapril daily  . Headache(784.0)     occasionally  . CHOLECYSTECTOMY, HX OF   . Fibromyalgia     doesn't require meds  . Joint pain   . Joint swelling   . Chronic back pain   . Bruises easily   . H/O hiatal hernia   . GERD (gastroesophageal reflux disease)     takes Nexium daily  . UTI (lower urinary tract infection)     hx of;saw urologist last yr and he told her she was over reacting and not a big deal   . Anemia     iron def  . Hx of seasonal allergies     takes Claritin daily  .  Insomnia     related to pain;takes Flexeril and Tylenol PM nightly  . S/P lumbar fusion 6 13 09/03/2011    L4 L5  posterior   . Cough, persistent 01/21/2012    poss from acei   . 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 Abnormal Pap smear     H/O  . Goiter   . Arthritis     rheumatoid and osteo now flt to be psoriateic   . Arthritis     psoreratic, osteoarthritis  . Broken toe     SURGICAL HISTORY: Past Surgical History  Procedure Laterality Date  . Breast reduction surgery      last 80's  . Cervical fusion      2 12   .  Tonsillectomy      as a child  . Abdominal hysterectomy  1986    TAH/BSO  . Cholecystectomy  1996  . Back surgery  12+yrs ago    Synovial Cyst removal  . Bone spur  6-66yrs ago    right ankle  . Colonoscopy    . Back surgery  6/13    lumbar L4-L5 fusion, lamenectomy  . Blood transfused  6/13    transfused 2 units post op from back surgery  . Bunionectomy  10/03/14    right foot    SOCIAL HISTORY: Social History   Social History  . Marital Status: Widowed    Spouse Name: N/A  . Number of Children: N/A  . Years of Education: N/A   Occupational History  . Not on file.   Social History Main Topics  . Smoking status: Never Smoker   . Smokeless tobacco: Never Used  . Alcohol Use: No  . Drug Use: No  . Sexual Activity: No     Comment: TAH/BSO   Other Topics Concern  . 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 Diabetes Mother   . Colon cancer Mother     x2  . Arthritis Mother   . Hypertension Mother   . Stroke Father   . Heart disease Father   . Arthritis Brother   . Hypertension Brother   . Hyperlipidemia Brother   . Anesthesia problems Neg Hx   . Hypotension Neg Hx   . Malignant hyperthermia Neg Hx   . Pseudochol deficiency Neg Hx   . Other      DISH brother  . Endometrial cancer Mother   . Colon cancer Father   . Hypertension Maternal Grandmother   . Stroke Maternal Grandmother   . Stroke Father   . Infertility Brother     ALLERGIES:  is allergic to erythromycin; iodinated diagnostic agents; ace inhibitors; keflex; and macrobid.  MEDICATIONS:  Current Outpatient Prescriptions  Medication Sig Dispense Refill  . Acetaminophen (TYLENOL ARTHRITIS PAIN PO) Take 1,300 mg by mouth 2 (two) times daily as needed.     Jade Cardenas Kitchen amLODipine (NORVASC) 5 MG tablet  Take 1 tablet (5 mg total) by mouth daily. 90 tablet 2  . cyclobenzaprine (FLEXERIL) 5 MG tablet   2  . Diphenhydramine-APAP, sleep, (TYLENOL PM EXTRA STRENGTH PO) Take 2 tablets by mouth at bedtime as needed. For sleep    . esomeprazole (NEXIUM) 40 MG capsule Take 1 capsule (40 mg  total) by mouth daily before breakfast. 30 capsule 11  . HYDROcodone-acetaminophen (NORCO/VICODIN) 5-325 MG per tablet Take 1-2 tablets by mouth every 4 (four) hours as needed for moderate pain (every 4-6 hours).    . losartan (COZAAR) 100 MG tablet TAKE 1 TABLET EVERY DAY 90 tablet 1  . lovastatin (MEVACOR) 20 MG tablet Take 1 tablet (20 mg total) by mouth at bedtime. 90 tablet 3  . Multiple Vitamin (MULTIVITAMIN) tablet Take 1 tablet by mouth daily.      . NONFORMULARY OR COMPOUNDED [place] compound:  Antiinflammatory cream - Diclofencac 3%, Baclofen 2%, Cyclobenzaprine 2%, Lidocaine 2% dispense 120 gram, apply 1-2 grams to affected area 3-4 times daily, +2 refills. 120 each 2  . olopatadine (PATANOL) 0.1 % ophthalmic solution   2  . SIMPONI 50 MG/0.5ML SOAJ   1   No current facility-administered medications for this visit.    REVIEW OF SYSTEMS:    10 Point review of Systems was done is negative except as noted above.  PHYSICAL EXAMINATION: ECOG PERFORMANCE STATUS: 2 - Symptomatic, <50% confined to bed  . Filed Vitals:   05/22/15 1339  BP: 142/61  Pulse: 113  Temp: 98.1 F (36.7 C)  Resp: 18   Filed Weights   05/22/15 1339  Weight: 219 lb 12.8 oz (99.701 kg)   .Body mass index is 37.71 kg/(m^2).  GENERAL:alert, in no acute distress and comfortable ,somewhat fatigued appearing SKIN: skin color, texture, turgor are normal, no rashes or significant lesions EYES: mild conjunctival pallor no scleral icterus OROPHARYNX:no exudate, no erythema and lips, buccal mucosa, and tongue normal  NECK: supple, no JVD, thyroid normal size, non-tender, without nodularity LYMPH:  no palpable  lymphadenopathy in the cervical, axillary or inguinal LUNGS: clear to auscultation with normal respiratory effort HEART: regular rate & rhythm,  no murmurs and no lower extremity edema ABDOMEN: abdomen soft, non-tender, normoactive bowel sounds  Musculoskeletal: no cyanosis of digits and no clubbing  PSYCH: alert & oriented x 3 with fluent speech NEURO: no focal motor/sensory deficits  LABORATORY DATA:  I have reviewed the data as listed  . CBC Latest Ref Rng 05/22/2015 11/29/2014 07/24/2014  WBC 3.9 - 10.3 10e3/uL 7.4 5.1 4.6  Hemoglobin 11.6 - 15.9 g/dL 7.2(L) 10.5(L) 11.9(L)  Hematocrit 34.8 - 46.6 % 24.5(L) 32.3(L) 35.4(L)  Platelets 145 - 400 10e3/uL 393 406.0(H) 326.0   . CBC    Component Value Date/Time   WBC 7.4 05/22/2015 1522   WBC 5.1 11/29/2014 0913   RBC 3.43* 05/22/2015 1522   RBC 3.90 11/29/2014 0913   HGB 7.2* 05/22/2015 1522   HGB 10.5* 11/29/2014 0913   HCT 24.5* 05/22/2015 1522   HCT 32.3* 11/29/2014 0913   PLT 393 05/22/2015 1522   PLT 406.0* 11/29/2014 0913   MCV 71.4* 05/22/2015 1522   MCV 82.7 11/29/2014 0913   MCH 21.0* 05/22/2015 1522   MCH 26.3 08/07/2011 1738   MCHC 29.4* 05/22/2015 1522   MCHC 32.6 11/29/2014 0913   RDW 17.0* 05/22/2015 1522   RDW 14.5 11/29/2014 0913   LYMPHSABS 1.9 05/22/2015 1522   LYMPHSABS 1.6 11/29/2014 0913   MONOABS 0.3 05/22/2015 1522   MONOABS 0.3 11/29/2014 0913   EOSABS 0.1 05/22/2015 1522   EOSABS 0.1 11/29/2014 0913   BASOSABS 0.0 05/22/2015 1522   BASOSABS 0.1 11/29/2014 0913    . Lab Results  Component Value Date   RETICCTPCT 1.85 05/22/2015   RBC 3.43* 05/22/2015   RETICCTABS 63.46 05/22/2015    .  CMP Latest Ref Rng 05/22/2015 11/29/2014 07/24/2014  Glucose 70 - 140 mg/dl 125 106(H) 164(H)  BUN 7.0 - 26.0 mg/dL 18.9 17 15   Creatinine 0.6 - 1.1 mg/dL 0.9 0.79 0.74  Sodium 136 - 145 mEq/L 141 143 142  Potassium 3.5 - 5.1 mEq/L 4.1 5.4(H) 4.6  Chloride 98 - 110 mmol/L - 108 104  CO2 22 - 29 mEq/L 25  28 25   Calcium 8.4 - 10.4 mg/dL 9.4 9.6 9.6  Total Protein 6.4 - 8.3 g/dL 7.7 7.1 7.1  Total Bilirubin 0.20 - 1.20 mg/dL 0.30 0.7 0.5  Alkaline Phos 40 - 150 U/L 102 99 90  AST 5 - 34 U/L 14 18 20   ALT 0 - 55 U/L 13 18 26      RADIOGRAPHIC STUDIES: I have personally reviewed the radiological images as listed and agreed with the findings in the report. Ct Cervical Spine W Contrast  05/08/2015  CLINICAL DATA:  Neck pain. LEFT shoulder and arm pain. Previous surgery. FLUOROSCOPY TIME:  54 seconds corresponding to a Dose Area Product of 387.29 Gy*m2 PROCEDURE: LUMBAR PUNCTURE FOR CERVICAL MYELOGRAM After thorough discussion of risks and benefits of the procedure including bleeding, infection, injury to nerves, blood vessels, adjacent structures as well as headache and CSF leak, written and oral informed consent was obtained. Consent was obtained by Dr. Rolla Flatten. We discussed the high likelihood of obtaining a diagnostic study. Patient was positioned prone on the fluoroscopy table. Local anesthesia was provided with 1% lidocaine without epinephrine after prepped and draped in the usual sterile fashion. Puncture was performed at L4-5 using a 3 1/2 inch 22-gauge spinal needle via midline approach. Using a single pass through the dura, the needle was placed within the thecal sac, with return of clear CSF. 10 mL of Isovue M 300 was injected into the thecal sac, with normal opacification of the nerve roots and cauda equina consistent with free flow within the subarachnoid space. The patient was then moved to the trendelenburg position and contrast flowed into the Cervical spine region. I personally performed the lumbar puncture and administered the intrathecal contrast. I also personally supervised acquisition of the myelogram images. TECHNIQUE: Contiguous axial images were obtained through the Cervical spine after the intrathecal infusion of infusion. Coronal and sagittal reconstructions were obtained of the  axial image sets. FINDINGS: CERVICAL MYELOGRAM FINDINGS: Good opacification of the cervical subarachnoid space. Previous C3-4 fusion is solid. Previous C5-6 fusion is solid. Shallow ventral defects at C2-3 and C4-5. A dominant LEFT-sided area of nerve root compression is not established. CT CERVICAL MYELOGRAM FINDINGS: No prevertebral or paraspinous masses. Anatomic alignment. Normal cord size throughout. Craniocervical junction unremarkable. Carotid atherosclerosis. Lung apices clear. Asymmetric RIGHT-sided thyroid calcification, not clearly associated with the mass. Thyroid sonography recommended for further evaluation. The individual disc spaces were examined as follows: C2-3: Annular bulging. Severe BILATERAL facet arthropathy, worse on the RIGHT. RIGHT greater than LEFT foraminal narrowing could affect both C3 nerve roots. C3-4:  Solid fusion.  No impingement. C4-5: BILATERAL uncinate spurring, greater on the RIGHT. Severe facet arthropathy, also worse on the RIGHT. RIGHT greater than LEFT C5 nerve root impingement. C5-6:  Solid fusion.  No impingement. C6-7: Asymmetric uncinate spurring on the LEFT. Severe LEFT greater than RIGHT facet arthropathy. Dorsal ligamentum flavum hypertrophy. LEFT C7 nerve root impingement likely. C7-T1: BILATERAL facet arthropathy worse on the RIGHT. Shallow disc osteophyte complex. Ligamentum flavum calcification, also worse on the RIGHT narrows the foramen, potentially affecting the C8 nerve root on  that side. Compared with prior MR 02/01/2015, the appearance is similar. There is a joint effusion at C6-7 on the LEFT extending to the bursa. Based on MR, the LEFT C6-7 facet joint is acutely inflamed. IMPRESSION: Solid C3-4 and C5-6 fusions. No significant spinal stenosis or cord compression at any level. Adjacent segment disease at C2-3 and C4-5 appears worse on the RIGHT. Asymmetric foraminal narrowing at C6-7 on the LEFT relates to severe facet arthropathy and uncinate spurring.  LEFT-sided symptomatology could derive from LEFT C7 nerve root impingement, as well as facet inflammatory change. Electronically Signed   By: Staci Righter M.D.   On: 05/08/2015 13:29   Dg Myelography Lumbar Inj Cervical  05/08/2015  CLINICAL DATA:  Neck pain. LEFT shoulder and arm pain. Previous surgery. FLUOROSCOPY TIME:  54 seconds corresponding to a Dose Area Product of 387.29 Gy*m2 PROCEDURE: LUMBAR PUNCTURE FOR CERVICAL MYELOGRAM After thorough discussion of risks and benefits of the procedure including bleeding, infection, injury to nerves, blood vessels, adjacent structures as well as headache and CSF leak, written and oral informed consent was obtained. Consent was obtained by Dr. Rolla Flatten. We discussed the high likelihood of obtaining a diagnostic study. Patient was positioned prone on the fluoroscopy table. Local anesthesia was provided with 1% lidocaine without epinephrine after prepped and draped in the usual sterile fashion. Puncture was performed at L4-5 using a 3 1/2 inch 22-gauge spinal needle via midline approach. Using a single pass through the dura, the needle was placed within the thecal sac, with return of clear CSF. 10 mL of Isovue M 300 was injected into the thecal sac, with normal opacification of the nerve roots and cauda equina consistent with free flow within the subarachnoid space. The patient was then moved to the trendelenburg position and contrast flowed into the Cervical spine region. I personally performed the lumbar puncture and administered the intrathecal contrast. I also personally supervised acquisition of the myelogram images. TECHNIQUE: Contiguous axial images were obtained through the Cervical spine after the intrathecal infusion of infusion. Coronal and sagittal reconstructions were obtained of the axial image sets. FINDINGS: CERVICAL MYELOGRAM FINDINGS: Good opacification of the cervical subarachnoid space. Previous C3-4 fusion is solid. Previous C5-6 fusion is  solid. Shallow ventral defects at C2-3 and C4-5. A dominant LEFT-sided area of nerve root compression is not established. CT CERVICAL MYELOGRAM FINDINGS: No prevertebral or paraspinous masses. Anatomic alignment. Normal cord size throughout. Craniocervical junction unremarkable. Carotid atherosclerosis. Lung apices clear. Asymmetric RIGHT-sided thyroid calcification, not clearly associated with the mass. Thyroid sonography recommended for further evaluation. The individual disc spaces were examined as follows: C2-3: Annular bulging. Severe BILATERAL facet arthropathy, worse on the RIGHT. RIGHT greater than LEFT foraminal narrowing could affect both C3 nerve roots. C3-4:  Solid fusion.  No impingement. C4-5: BILATERAL uncinate spurring, greater on the RIGHT. Severe facet arthropathy, also worse on the RIGHT. RIGHT greater than LEFT C5 nerve root impingement. C5-6:  Solid fusion.  No impingement. C6-7: Asymmetric uncinate spurring on the LEFT. Severe LEFT greater than RIGHT facet arthropathy. Dorsal ligamentum flavum hypertrophy. LEFT C7 nerve root impingement likely. C7-T1: BILATERAL facet arthropathy worse on the RIGHT. Shallow disc osteophyte complex. Ligamentum flavum calcification, also worse on the RIGHT narrows the foramen, potentially affecting the C8 nerve root on that side. Compared with prior MR 02/01/2015, the appearance is similar. There is a joint effusion at C6-7 on the LEFT extending to the bursa. Based on MR, the LEFT C6-7 facet joint is acutely inflamed. IMPRESSION: Solid  C3-4 and C5-6 fusions. No significant spinal stenosis or cord compression at any level. Adjacent segment disease at C2-3 and C4-5 appears worse on the RIGHT. Asymmetric foraminal narrowing at C6-7 on the LEFT relates to severe facet arthropathy and uncinate spurring. LEFT-sided symptomatology could derive from LEFT C7 nerve root impingement, as well as facet inflammatory change. Electronically Signed   By: Staci Righter M.D.   On:  05/08/2015 13:29    ASSESSMENT & PLAN:   Ms. Cluster is a pleasant 65 year old female with  #1 severe microcytic hypochromic anemia.  She has had progressively worsening anemia although the last 10 months or so now with significant fatigue and very limited exercise tolerance. She also seems to have some pica symptoms in the form of craving celery.  #2 severe iron deficiency likely due to chronic GI bleeding. She has a EGD and colonoscopy in 2013.  Previous history of gastric ulcer requiring blood transfusions.  Also send gastric polyps and a large hernia and prone to developing Cameron ulcers, previous colonic polyps and previous bleeding hemorrhoids.  Patient reports no overt GI bleeding at this time.  Notes that her hemorrhoids have not been an issue since her taken care of.  #3 history of poor tolerance to oral iron with significant GI distress.  She is also on chronic PPI therapy that puts her at higher risk of developing iron deficiency due to poor absorption.  And also B12 deficiency and possible vitamin D deficiency. Plan -We'll get iron studies today -pending. -Erythropoietin, sedimentation rate. -Simponi might cause some mild leukopenia that is not associated with significant anemia.  No evidence of lymphoproliferative syndrome or infection in the setting of using Biologics. -Continue multivitamin -Discuss replacing iron IV and after discussing pros and cons she is agreeable to proceed with this.  Given her medication allergies we shall premedicate her with the Benadryl which she reports she tolerates very well. -Followup with primary care physician for fecal occult blood testing and for a gastroenterology referral for repeat GI evaluation to rule out an active source of blood loss. -Avoid NSAIDs and continue PPI. -Patient was counseled to call us if she develops dizziness, presyncopal symptoms, severe shortness of breath since those might be indications to require blood  transfusion.  He does not have these indications currently but is anemic enough that he would want to treat her aggressively with iron replacement.  Presented care with Dr.Wesleigh Markovic in 8 weeks with repeat CBC, CMP and iron labs. And to follow primary care physician for management of other chronic medical issues and with Dr.Deveshwar for mx of her rheumatologic issues.  All of the patients questions were answered with apparent satisfaction. The patient knows to call the clinic with any problems, questions or concerns.  I spent 55 minutes counseling the patient face to face. The total time spent in the appointment was 60 minutes and more than 50% was on counseling and direct patient cares.    Sullivan Lone MD [place] AAHIVMS [facility] Hematology/Oncology Physician [facility]  (Office):       [phone] (Work cell):  [phone] (Fax):           [phone]  05/22/2015 1:51 PM

## 2015-05-23 LAB — IRON AND TIBC
%SAT: 3 % — ABNORMAL LOW (ref 21–57)
Iron: 14 ug/dL — ABNORMAL LOW (ref 41–142)
TIBC: 434 ug/dL (ref 236–444)
UIBC: 420 ug/dL — ABNORMAL HIGH (ref 120–384)

## 2015-05-23 LAB — HAPTOGLOBIN: Haptoglobin: 243 mg/dL — ABNORMAL HIGH (ref 34–200)

## 2015-05-23 LAB — FERRITIN: Ferritin: 4 ng/ml — ABNORMAL LOW (ref 9–269)

## 2015-05-23 LAB — ERYTHROPOIETIN: Erythropoietin: 242.4 m[IU]/mL — ABNORMAL HIGH (ref 2.6–18.5)

## 2015-05-23 LAB — SEDIMENTATION RATE: Sedimentation Rate-Westergren: 86 mm/hr — ABNORMAL HIGH (ref 0–40)

## 2015-05-23 LAB — VITAMIN B12: Vitamin B12: 595 pg/mL (ref 211–946)

## 2015-05-23 LAB — VITAMIN D 25 HYDROXY (VIT D DEFICIENCY, FRACTURES): Vitamin D, 25-Hydroxy: 25.6 ng/mL — ABNORMAL LOW (ref 30.0–100.0)

## 2015-05-23 NOTE — Progress Notes (Signed)
Pre visit review using our clinic review tool, if applicable. No additional management support is needed unless otherwise documented below in the visit note.  Chief Complaint  Patient presents with  . Follow-up    anemia ht multiple problems     HPI:  Jade Cardenas 65 y.o.  Comes in for Chronic disease management and worsening  anemia felt to  Be occult gi blood loss  See hematology consult   Has dropped  Hg to 7.2 and to get iv iron .   In past gi [name] with gastric u;cer;last colon  Endo 2013  But has no sig gi sx blood in stool etc   BP: has been ok up and down  Is very tired but no cps syncope sob mentation issues.    Has has had newer prgressive  Neck ha pain and had   Ct scan myelogram  Dr Trenton Gammon .     Left sided pain  dosent have results from NS yet , dec rom  From  Prev surgery and djd spine  Vit d  slightly los not on a part supp  ROS: See pertinent positives and negatives per HPI.  Past Medical History  Diagnosis Date  . HYPERLIPIDEMIA 12/29/2006    takes Lovastatin nightly  . Right bundle branch block 02/09/2007  . IBS 04/28/2007  . MENOPAUSAL DISORDER 12/07/2007  . Rheumatoid arthritis(714.0) 12/29/2006  . SYNCOPE 12/07/2007  . WEIGHT GAIN 12/18/2008  . Palpitations 02/09/2007  . UNS ADVRS EFF OTH RX MEDICINAL\T\BIOLOGICAL SBSTNC 02/09/2007  . GASTRIC ULCER, ACUTE, HEMORRHAGE, HX OF 02/09/2007  . COLONIC POLYPS, HX OF 02/09/2007  . REDUCTION MAMMOPLASTY, HX OF 02/09/2007  . HYPERTENSION 12/29/2006    takes Enalapril daily  . Headache(784.0)     occasionally  . CHOLECYSTECTOMY, HX OF   . Fibromyalgia     doesn't require meds  . Joint pain   . Joint swelling   . Chronic back pain   . Bruises easily   . H/O hiatal hernia   . GERD (gastroesophageal reflux disease)     takes Nexium daily  . UTI (lower urinary tract infection)     hx of;saw urologist last yr and he told her she was over reacting and not a big deal   . Anemia     iron def  . Hx of  seasonal allergies     takes Claritin daily  . Insomnia     related to pain;takes Flexeril and Tylenol PM nightly  . S/P lumbar fusion 6 13 09/03/2011    L4 L5  posterior   . Cough, persistent 01/21/2012    poss from acei   . 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 Abnormal Pap smear     H/O  . Goiter   . Arthritis     rheumatoid and osteo now flt to be psoriateic   . Arthritis     psoreratic, osteoarthritis  . Broken toe     Family History  Problem Relation Age of Onset  . Diabetes Mother   . Colon cancer Mother     x2  . Arthritis Mother   . Hypertension Mother   . Stroke Father   . Heart disease Father   . Arthritis Brother   . Hypertension Brother   . Hyperlipidemia Brother   . Anesthesia problems Neg Hx   . Hypotension Neg Hx   . Malignant hyperthermia Neg Hx   . Pseudochol deficiency Neg Hx   . Other  DISH brother  . Endometrial cancer Mother   . Colon cancer Father   . Hypertension Maternal Grandmother   . Stroke Maternal Grandmother   . Stroke Father   . Infertility Brother     Social History   Social History  . Marital Status: Widowed    Spouse Name: N/A  . Number of Children: N/A  . Years of Education: N/A   Social History Main Topics  . Smoking status: Never Smoker   . Smokeless tobacco: Never Used  . Alcohol Use: No  . Drug Use: No  . Sexual Activity: No     Comment: TAH/BSO   Other Topics Concern  . 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 amLODipine (NORVASC) 5 MG tablet Take 1 tablet (5 mg total) by mouth daily. 90 tablet 2  . Diphenhydramine-APAP, sleep, (TYLENOL PM EXTRA STRENGTH PO) Take 2 tablets by mouth at bedtime as needed. For sleep    . esomeprazole (NEXIUM) 40 MG capsule Take 1 capsule (40 mg total) by mouth daily  before breakfast. 30 capsule 11  . losartan (COZAAR) 100 MG tablet TAKE 1 TABLET EVERY DAY 90 tablet 1  . lovastatin (MEVACOR) 20 MG tablet Take 1 tablet (20 mg total) by mouth at bedtime. 90 tablet 3  . Multiple Vitamin (MULTIVITAMIN) tablet Take 1 tablet by mouth daily.      . NONFORMULARY OR COMPOUNDED [place] compound:  Antiinflammatory cream - Diclofencac 3%, Baclofen 2%, Cyclobenzaprine 2%, Lidocaine 2% dispense 120 gram, apply 1-2 grams to affected area 3-4 times daily, +2 refills. 120 each 2  . olopatadine (PATANOL) 0.1 % ophthalmic solution   2  . SIMPONI 50 MG/0.5ML SOAJ   1  . cyclobenzaprine (FLEXERIL) 5 MG tablet   2  . HYDROcodone-acetaminophen (NORCO/VICODIN) 5-325 MG per tablet Take 1-2 tablets by mouth every 4 (four) hours as needed for moderate pain (every 4-6 hours).     No facility-administered medications prior to visit.     EXAM:  BP 146/60 mmHg  Temp(Src) 97.8 F (36.6 C) (Oral)  Wt 217 lb 12.8 oz (98.793 kg)  LMP 03/02/1984  Body mass index is 37.37 kg/(m^2).  GENERAL: vitals reviewed and listed above, alert, oriented, appears well hydrated and in no acute distress non toxic  Good color for the level of hg  ( says she wears makeup) HEENT: atraumatic, conjunctiva  clear, no obvious abnormalities on inspection of external nose and ears OP : no lesion edema or exudate  NECK: no obvious masses on inspection palpation  LUNGS: clear to auscultation bilaterally, no wheezes, rales or rhonchi, good air movement CV: HRRR, no clubbing cyanosis or  peripheral edema nl cap refill  abd no masses noted g or r  MS: moves all extremities  Arthritis changes   Dec rom neck but nl function  Skin no bruising petechiaePSYCH: pleasant and cooperative, no obvious depression or anxiety Neuro grossly nl .  Lab Results  Component Value Date   WBC 7.4 05/22/2015   HGB 7.2* 05/22/2015   HCT 24.5* 05/22/2015   PLT 393 05/22/2015   GLUCOSE 125 05/22/2015   CHOL 189  11/29/2014   TRIG 145.0 11/29/2014   HDL 57.70 11/29/2014   LDLDIRECT 114.0 04/02/2010   LDLCALC 103*  11/29/2014   ALT 13 05/22/2015   AST 14 05/22/2015   NA 141 05/22/2015   K 4.1 05/22/2015   CL 108 11/29/2014   CREATININE 0.9 05/22/2015   BUN 18.9 05/22/2015   CO2 25 05/22/2015   TSH 1.31 11/29/2014   HGBA1C 6.1 11/29/2014   Wt Readings from Last 3 Encounters:  05/24/15 217 lb 12.8 oz (98.793 kg)  05/22/15 219 lb 12.8 oz (99.701 kg)  11/27/14 221 lb 6.4 oz (100.426 kg)   BP Readings from Last 3 Encounters:  05/24/15 146/60  05/22/15 142/61  05/08/15 136/61   Mild elevation haptoglobin and esr 86 ibc 3% ferritin 4  ASSESSMENT AND PLAN:  Discussed the following assessment and plan:  Anemia, iron deficiency - sig drop without obv gi bleed get stool cards  gi consult asap  - Plan: POCT urinalysis dipstick, Ambulatory referral to Gastroenterology  Essential hypertension - Plan: POCT urinalysis dipstick  H/O hiatal hernia  ESR raised  Psoriatic arthritis ([place]) - mamaged dr D   DJD (degenerative joint disease) of cervical spine - left with radiating sx Fu dr Trenton Gammon  Vitamin D deficiency - mild  on ppi alos  add 1000 reg vit d 3 per day as tolerated   High risk medication use  c6-7  arthropathy foraminal narrowing.  revewied report   Dr Trenton Gammon  Managing this issue Pt aware -Patient advised to return or notify health care team  if symptoms worsen ,persist or new concerns arise. Heme to help with iv iron repletion and gi looking for cause   Has hx of hgih risk med but not felt to be the cause .  [place] today to ensure no hematuria.Marland Kitchenetc Fortunately  hemmodynamically stable but can dec bp if going low in the interim   Patient Instructions  Do stool tests as planned  . Need GI evaluation as soon as possible .  [name] .   Looking for  Slow leak blood loss. Seems to be  Totally iron deficiency .   Take   1000 to 2000 iu vit d per day .  Get UA today .   Due for   Wellness  cpx in 6 months  .         Standley Brooking. Chikita Dogan M.D.

## 2015-05-24 ENCOUNTER — Encounter: Payer: Self-pay | Admitting: Internal Medicine

## 2015-05-24 ENCOUNTER — Ambulatory Visit (INDEPENDENT_AMBULATORY_CARE_PROVIDER_SITE_OTHER): Payer: BLUE CROSS/BLUE SHIELD | Admitting: Internal Medicine

## 2015-05-24 VITALS — BP 146/60 | Temp 97.8°F | Wt 217.8 lb

## 2015-05-24 DIAGNOSIS — Z8719 Personal history of other diseases of the digestive system: Secondary | ICD-10-CM

## 2015-05-24 DIAGNOSIS — D509 Iron deficiency anemia, unspecified: Secondary | ICD-10-CM

## 2015-05-24 DIAGNOSIS — L4050 Arthropathic psoriasis, unspecified: Secondary | ICD-10-CM

## 2015-05-24 DIAGNOSIS — I10 Essential (primary) hypertension: Secondary | ICD-10-CM

## 2015-05-24 DIAGNOSIS — R700 Elevated erythrocyte sedimentation rate: Secondary | ICD-10-CM | POA: Diagnosis not present

## 2015-05-24 DIAGNOSIS — E559 Vitamin D deficiency, unspecified: Secondary | ICD-10-CM

## 2015-05-24 DIAGNOSIS — M47812 Spondylosis without myelopathy or radiculopathy, cervical region: Secondary | ICD-10-CM

## 2015-05-24 DIAGNOSIS — Z79899 Other long term (current) drug therapy: Secondary | ICD-10-CM

## 2015-05-24 LAB — POCT URINALYSIS DIPSTICK
Bilirubin, UA: NEGATIVE
Blood, UA: NEGATIVE
Glucose, UA: NEGATIVE
Ketones, UA: NEGATIVE
Leukocytes, UA: NEGATIVE
Nitrite, UA: NEGATIVE
Protein, UA: NEGATIVE
Spec Grav, UA: 1.025
Urobilinogen, UA: 0.2
pH, UA: 6.5

## 2015-05-24 NOTE — Patient Instructions (Addendum)
Do stool tests as planned  . Need GI evaluation as soon as possible .  [name] .   Looking for  Slow leak blood loss. Seems to be  Totally iron deficiency .   Take   1000 to 2000 iu vit d per day .  Get UA today .   Due for  Wellness  cpx in 6 months  .

## 2015-05-28 ENCOUNTER — Ambulatory Visit (HOSPITAL_BASED_OUTPATIENT_CLINIC_OR_DEPARTMENT_OTHER): Payer: BLUE CROSS/BLUE SHIELD

## 2015-05-28 VITALS — BP 119/51 | HR 85 | Temp 98.1°F | Resp 18

## 2015-05-28 DIAGNOSIS — D509 Iron deficiency anemia, unspecified: Secondary | ICD-10-CM

## 2015-05-28 DIAGNOSIS — E611 Iron deficiency: Secondary | ICD-10-CM

## 2015-05-28 MED ORDER — SODIUM CHLORIDE 0.9 % IV SOLN
Freq: Once | INTRAVENOUS | Status: AC
  Administered 2015-05-28: 12:00:00 via INTRAVENOUS

## 2015-05-28 MED ORDER — SODIUM CHLORIDE 0.9 % IV SOLN
510.0000 mg | Freq: Once | INTRAVENOUS | Status: AC
  Administered 2015-05-28: 510 mg via INTRAVENOUS
  Filled 2015-05-28: qty 17

## 2015-05-28 NOTE — Patient Instructions (Signed)

## 2015-05-28 NOTE — Progress Notes (Signed)
Pt tolerated feraheme infusion well. Pt monitored 30 minutes post infusion. Pt and VS stable at time of discharge.

## 2015-05-29 ENCOUNTER — Encounter: Payer: Self-pay | Admitting: Hematology

## 2015-05-30 ENCOUNTER — Telehealth: Payer: Self-pay | Admitting: *Deleted

## 2015-05-30 NOTE — Telephone Encounter (Signed)
Received message from pt inquiring about lab results.  Per Dr. Irene Limbo, nothing remarkable besides iron results (pt receiving IV feraheme infusions).  Dr. Irene Limbo suggested she start taking vitamin D regularly, pt informed, and pt stated her primary recently suggested she start taking Vit D as well.  Pt thankful for call.

## 2015-06-03 ENCOUNTER — Other Ambulatory Visit (INDEPENDENT_AMBULATORY_CARE_PROVIDER_SITE_OTHER): Payer: BLUE CROSS/BLUE SHIELD

## 2015-06-03 DIAGNOSIS — R195 Other fecal abnormalities: Secondary | ICD-10-CM | POA: Diagnosis not present

## 2015-06-03 LAB — POC HEMOCCULT BLD/STL (HOME/3-CARD/SCREEN)
Card #2 Fecal Occult Blod, POC: NEGATIVE
Card #3 Fecal Occult Blood, POC: NEGATIVE
Fecal Occult Blood, POC: NEGATIVE

## 2015-06-03 NOTE — Progress Notes (Signed)
Quick Note:  Inform patient stool test negative for blood . Send results to Her Gi doc ______

## 2015-06-04 ENCOUNTER — Ambulatory Visit (HOSPITAL_BASED_OUTPATIENT_CLINIC_OR_DEPARTMENT_OTHER): Payer: BLUE CROSS/BLUE SHIELD

## 2015-06-04 VITALS — BP 110/48 | HR 78 | Temp 98.3°F | Resp 18

## 2015-06-04 DIAGNOSIS — D509 Iron deficiency anemia, unspecified: Secondary | ICD-10-CM

## 2015-06-04 DIAGNOSIS — E611 Iron deficiency: Secondary | ICD-10-CM

## 2015-06-04 MED ORDER — SODIUM CHLORIDE 0.9 % IV SOLN
Freq: Once | INTRAVENOUS | Status: AC
  Administered 2015-06-04: 11:00:00 via INTRAVENOUS

## 2015-06-04 MED ORDER — SODIUM CHLORIDE 0.9 % IV SOLN
510.0000 mg | Freq: Once | INTRAVENOUS | Status: AC
  Administered 2015-06-04: 510 mg via INTRAVENOUS
  Filled 2015-06-04: qty 17

## 2015-06-04 NOTE — Progress Notes (Signed)
Pt took Benadryl 25 mg PO at 8:00AM at home.

## 2015-06-05 ENCOUNTER — Other Ambulatory Visit: Payer: Self-pay | Admitting: Neurosurgery

## 2015-06-07 ENCOUNTER — Other Ambulatory Visit: Payer: Self-pay

## 2015-06-07 DIAGNOSIS — Z9889 Other specified postprocedural states: Secondary | ICD-10-CM

## 2015-06-07 DIAGNOSIS — Z1231 Encounter for screening mammogram for malignant neoplasm of breast: Secondary | ICD-10-CM

## 2015-06-13 ENCOUNTER — Other Ambulatory Visit: Payer: Self-pay | Admitting: *Deleted

## 2015-06-27 ENCOUNTER — Ambulatory Visit
Admission: RE | Admit: 2015-06-27 | Discharge: 2015-06-27 | Disposition: A | Discharge status: Home / Self Care | Payer: BLUE CROSS/BLUE SHIELD | Source: Ambulatory Visit

## 2015-06-27 DIAGNOSIS — Z1231 Encounter for screening mammogram for malignant neoplasm of breast: Secondary | ICD-10-CM

## 2015-06-27 DIAGNOSIS — Z9889 Other specified postprocedural states: Secondary | ICD-10-CM

## 2015-07-17 NOTE — Pre-Procedure Instructions (Signed)
    Jade Cardenas  07/17/2015      [facility] [place] [place] [address] Phone: [phone] Fax: [phone]  [facility] [address] [place], [place] [facility] [place] [id number] [place] [id number] Phone: [phone] Fax: [phone]  [id number] [place], [place] [place] [address] Phone: [phone] Fax: [phone]  [place], [place] [address] [place] [address] Phone: [phone] Fax: [phone]    Your procedure is scheduled on Tuesday Jul 30, 2015.  Report to [facility] Admitting at 9:30 A.M.  Call this number if you have problems the morning of surgery:  [phone]   Remember:  Do not eat food or drink liquids after midnight.   Take these medicines the morning of surgery with A SIP OF WATER: esomeprazole (Nexium), eye drops, AMLODIPINE (NORVASC),   7 Days prior to surgery stop taking Aspirin, Advil, Aleve, Motrin, Naproxen, ibuprofen BC's, Goody's, fish oil, Herbal medications, and vitamins.  STOP TAKING ANTI-INFLAMMATORY CREAM AND SIMPONI    Do not wear jewelry, make-up or nail polish.  Do not wear lotions, powders, or perfumes.  You may NOT wear deodorant.  Do not shave 48 hours prior to surgery.    Do not bring valuables to the hospital.   [facility] is not responsible for any belongings or valuables.  Contacts, dentures or bridgework may not be worn into surgery.  Leave your suitcase in the car.  After surgery it may be brought to your room.  For patients admitted to the hospital, discharge time will be determined by your treatment team.  Patients discharged the day of surgery will not be allowed to drive home.    Special instructions:  See  attached  Please read over the following fact sheets that you were given. Pain Booklet, Coughing and Deep Breathing, MRSA Information and Surgical Site Infection Prevention

## 2015-07-18 ENCOUNTER — Other Ambulatory Visit: Payer: Self-pay

## 2015-07-18 ENCOUNTER — Encounter (HOSPITAL_COMMUNITY)
Admission: RE | Admit: 2015-07-18 | Discharge: 2015-07-18 | Disposition: A | Discharge status: Home / Self Care | Payer: BLUE CROSS/BLUE SHIELD | Source: Ambulatory Visit | Attending: Neurosurgery | Admitting: Neurosurgery

## 2015-07-18 ENCOUNTER — Encounter (HOSPITAL_COMMUNITY): Payer: Self-pay

## 2015-07-18 DIAGNOSIS — Z79899 Other long term (current) drug therapy: Secondary | ICD-10-CM | POA: Diagnosis not present

## 2015-07-18 DIAGNOSIS — E785 Hyperlipidemia, unspecified: Secondary | ICD-10-CM | POA: Insufficient documentation

## 2015-07-18 DIAGNOSIS — Z01812 Encounter for preprocedural laboratory examination: Secondary | ICD-10-CM | POA: Diagnosis not present

## 2015-07-18 DIAGNOSIS — Z981 Arthrodesis status: Secondary | ICD-10-CM | POA: Insufficient documentation

## 2015-07-18 DIAGNOSIS — I10 Essential (primary) hypertension: Secondary | ICD-10-CM | POA: Diagnosis not present

## 2015-07-18 DIAGNOSIS — Z01818 Encounter for other preprocedural examination: Secondary | ICD-10-CM | POA: Insufficient documentation

## 2015-07-18 DIAGNOSIS — I4510 Unspecified right bundle-branch block: Secondary | ICD-10-CM | POA: Diagnosis not present

## 2015-07-18 DIAGNOSIS — K219 Gastro-esophageal reflux disease without esophagitis: Secondary | ICD-10-CM | POA: Insufficient documentation

## 2015-07-18 DIAGNOSIS — M069 Rheumatoid arthritis, unspecified: Secondary | ICD-10-CM | POA: Diagnosis not present

## 2015-07-18 HISTORY — DX: Personal history of other diseases of the respiratory system: Z87.09

## 2015-07-18 HISTORY — DX: Other specified postprocedural states: Z98.890

## 2015-07-18 HISTORY — DX: Other specified postprocedural states: R11.2

## 2015-07-18 LAB — CBC WITH DIFFERENTIAL/PLATELET
Basophils Absolute: 0.1 10*3/uL (ref 0.0–0.1)
Basophils Relative: 1 %
Eosinophils Absolute: 0.1 10*3/uL (ref 0.0–0.7)
Eosinophils Relative: 1 %
HCT: 37.6 % (ref 36.0–46.0)
Hemoglobin: 12.4 g/dL (ref 12.0–15.0)
Lymphocytes Relative: 28 %
Lymphs Abs: 1.5 10*3/uL (ref 0.7–4.0)
MCH: 27.3 pg (ref 26.0–34.0)
MCHC: 33 g/dL (ref 30.0–36.0)
MCV: 82.6 fL (ref 78.0–100.0)
Monocytes Absolute: 0.3 10*3/uL (ref 0.1–1.0)
Monocytes Relative: 6 %
Neutro Abs: 3.5 10*3/uL (ref 1.7–7.7)
Neutrophils Relative %: 64 %
Platelets: 321 10*3/uL (ref 150–400)
RBC: 4.55 MIL/uL (ref 3.87–5.11)
RDW: 22.6 % — ABNORMAL HIGH (ref 11.5–15.5)
WBC: 5.5 10*3/uL (ref 4.0–10.5)

## 2015-07-18 LAB — BASIC METABOLIC PANEL
Anion gap: 10 (ref 5–15)
BUN: 14 mg/dL (ref 6–20)
CO2: 26 mmol/L (ref 22–32)
Calcium: 9.6 mg/dL (ref 8.9–10.3)
Chloride: 105 mmol/L (ref 101–111)
Creatinine, Ser: 0.74 mg/dL (ref 0.44–1.00)
GFR calc Af Amer: >60 mL/min (ref >60)
GFR calc non Af Amer: >60 mL/min (ref >60)
Glucose, Bld: 108 mg/dL — ABNORMAL HIGH (ref 65–99)
Potassium: 4.5 mmol/L (ref 3.5–5.1)
Sodium: 141 mmol/L (ref 135–145)

## 2015-07-18 LAB — SURGICAL PCR SCREEN
MRSA, PCR: NEGATIVE
Staphylococcus aureus: POSITIVE — AB

## 2015-07-18 NOTE — Progress Notes (Signed)
PCR positive for staph, called in prescription at patient's preferred pharmacy, contacted patient with results via phone message

## 2015-07-19 NOTE — Progress Notes (Signed)
Anesthesia Chart Review:  Pt is a 65 year old female scheduled for C2-3 laminectomy and foraminotomy on 07/30/2015 with Dr. Annette Stable.   PCP is Dr. Shanon Ace.   PMH includes:  HTN, RBBB, hyperlipidemia, RA, iron deficiency anemia, GERD. Never smoker. BMI 37. S/p posterior lumbar fusion 08/05/11.   Medications include: amlodipine, nexium, losartan, lovastatin, simponi.   Preoperative labs reviewed.    EKG 07/18/15: NSR. Incomplete RBBB  Echo 04/05/06:  - Overall left ventricular systolic function was normal. Left ventricular ejection fraction was estimated to be 65 %. There were no left ventricular regional wall motion abnormalities. - Normal aortic valve - Normal mitral valve  If no changes, I anticipate pt can proceed with surgery as scheduled.   Willeen Cass, FNP-BC [facility]Anesthesiology Phone: [phone] 07/19/2015 2:18 PM

## 2015-07-28 ENCOUNTER — Other Ambulatory Visit: Payer: Self-pay | Admitting: Internal Medicine

## 2015-07-29 ENCOUNTER — Other Ambulatory Visit: Payer: Self-pay | Admitting: Internal Medicine

## 2015-07-30 ENCOUNTER — Encounter (HOSPITAL_COMMUNITY)
Admission: RE | Disposition: A | Discharge status: Home / Self Care | Payer: Self-pay | Source: Ambulatory Visit | Attending: Neurosurgery

## 2015-07-30 ENCOUNTER — Ambulatory Visit (HOSPITAL_COMMUNITY): Payer: BLUE CROSS/BLUE SHIELD | Admitting: Anesthesiology

## 2015-07-30 ENCOUNTER — Encounter (HOSPITAL_COMMUNITY): Payer: Self-pay | Admitting: Anesthesiology

## 2015-07-30 ENCOUNTER — Other Ambulatory Visit: Payer: Self-pay | Admitting: General Practice

## 2015-07-30 ENCOUNTER — Ambulatory Visit (HOSPITAL_COMMUNITY): Payer: BLUE CROSS/BLUE SHIELD

## 2015-07-30 ENCOUNTER — Observation Stay (HOSPITAL_COMMUNITY)
Admission: RE | Admit: 2015-07-30 | Discharge: 2015-07-31 | Disposition: A | Discharge status: Home / Self Care | Payer: BLUE CROSS/BLUE SHIELD | Source: Ambulatory Visit | Attending: Neurosurgery | Admitting: Neurosurgery

## 2015-07-30 ENCOUNTER — Ambulatory Visit (HOSPITAL_COMMUNITY): Payer: BLUE CROSS/BLUE SHIELD | Admitting: Emergency Medicine

## 2015-07-30 DIAGNOSIS — M069 Rheumatoid arthritis, unspecified: Secondary | ICD-10-CM | POA: Diagnosis not present

## 2015-07-30 DIAGNOSIS — M549 Dorsalgia, unspecified: Secondary | ICD-10-CM | POA: Diagnosis not present

## 2015-07-30 DIAGNOSIS — Z981 Arthrodesis status: Secondary | ICD-10-CM | POA: Insufficient documentation

## 2015-07-30 DIAGNOSIS — Z86010 Personal history of colonic polyps: Secondary | ICD-10-CM | POA: Insufficient documentation

## 2015-07-30 DIAGNOSIS — I4510 Unspecified right bundle-branch block: Secondary | ICD-10-CM | POA: Diagnosis not present

## 2015-07-30 DIAGNOSIS — Z6836 Body mass index (BMI) 36.0-36.9, adult: Secondary | ICD-10-CM | POA: Insufficient documentation

## 2015-07-30 DIAGNOSIS — M797 Fibromyalgia: Secondary | ICD-10-CM | POA: Diagnosis not present

## 2015-07-30 DIAGNOSIS — Z91041 Radiographic dye allergy status: Secondary | ICD-10-CM | POA: Diagnosis not present

## 2015-07-30 DIAGNOSIS — E785 Hyperlipidemia, unspecified: Secondary | ICD-10-CM | POA: Diagnosis not present

## 2015-07-30 DIAGNOSIS — K589 Irritable bowel syndrome without diarrhea: Secondary | ICD-10-CM | POA: Insufficient documentation

## 2015-07-30 DIAGNOSIS — Z8719 Personal history of other diseases of the digestive system: Secondary | ICD-10-CM | POA: Diagnosis not present

## 2015-07-30 DIAGNOSIS — Z881 Allergy status to other antibiotic agents status: Secondary | ICD-10-CM | POA: Insufficient documentation

## 2015-07-30 DIAGNOSIS — M4722 Other spondylosis with radiculopathy, cervical region: Secondary | ICD-10-CM | POA: Diagnosis not present

## 2015-07-30 DIAGNOSIS — G4700 Insomnia, unspecified: Secondary | ICD-10-CM | POA: Diagnosis not present

## 2015-07-30 DIAGNOSIS — I10 Essential (primary) hypertension: Secondary | ICD-10-CM | POA: Diagnosis not present

## 2015-07-30 DIAGNOSIS — L4050 Arthropathic psoriasis, unspecified: Secondary | ICD-10-CM | POA: Insufficient documentation

## 2015-07-30 DIAGNOSIS — Z419 Encounter for procedure for purposes other than remedying health state, unspecified: Secondary | ICD-10-CM

## 2015-07-30 DIAGNOSIS — E669 Obesity, unspecified: Secondary | ICD-10-CM | POA: Diagnosis not present

## 2015-07-30 DIAGNOSIS — Z888 Allergy status to other drugs, medicaments and biological substances status: Secondary | ICD-10-CM | POA: Diagnosis not present

## 2015-07-30 DIAGNOSIS — M4802 Spinal stenosis, cervical region: Secondary | ICD-10-CM | POA: Diagnosis not present

## 2015-07-30 DIAGNOSIS — K219 Gastro-esophageal reflux disease without esophagitis: Secondary | ICD-10-CM | POA: Diagnosis not present

## 2015-07-30 DIAGNOSIS — K449 Diaphragmatic hernia without obstruction or gangrene: Secondary | ICD-10-CM | POA: Insufficient documentation

## 2015-07-30 DIAGNOSIS — M7138 Other bursal cyst, other site: Secondary | ICD-10-CM | POA: Insufficient documentation

## 2015-07-30 DIAGNOSIS — D649 Anemia, unspecified: Secondary | ICD-10-CM | POA: Insufficient documentation

## 2015-07-30 DIAGNOSIS — G8929 Other chronic pain: Secondary | ICD-10-CM | POA: Insufficient documentation

## 2015-07-30 HISTORY — PX: POSTERIOR CERVICAL LAMINECTOMY: SHX2248

## 2015-07-30 SURGERY — POSTERIOR CERVICAL LAMINECTOMY
Anesthesia: General | Laterality: Left

## 2015-07-30 MED ORDER — ADULT MULTIVITAMIN W/MINERALS CH: 1.0000 | ORAL_TABLET | Freq: Every day | ORAL | Status: DC

## 2015-07-30 MED ORDER — BUPIVACAINE HCL (PF) 0.25 % IJ SOLN
INTRAMUSCULAR | Status: DC | PRN
  Administered 2015-07-30: 20 mL

## 2015-07-30 MED ORDER — SODIUM CHLORIDE 0.9% FLUSH
3.0000 mL | Freq: Two times a day (BID) | INTRAVENOUS | Status: DC
Start: 2015-07-30 — End: 2015-07-31

## 2015-07-30 MED ORDER — ACETAMINOPHEN 650 MG RE SUPP: 650.0000 mg | RECTAL | Status: DC | PRN

## 2015-07-30 MED ORDER — LIDOCAINE HCL (CARDIAC) 20 MG/ML IV SOLN
INTRAVENOUS | Status: DC | PRN
  Administered 2015-07-30: 80 mg via INTRATRACHEAL

## 2015-07-30 MED ORDER — VANCOMYCIN HCL IN DEXTROSE 1-5 GM/200ML-% IV SOLN
1000.0000 mg | INTRAVENOUS | Status: AC
  Administered 2015-07-30: 1000 mg via INTRAVENOUS

## 2015-07-30 MED ORDER — ROCURONIUM BROMIDE 100 MG/10ML IV SOLN
INTRAVENOUS | Status: DC | PRN
  Administered 2015-07-30: 20 mg via INTRAVENOUS
  Administered 2015-07-30: 50 mg via INTRAVENOUS

## 2015-07-30 MED ORDER — AMLODIPINE BESYLATE 5 MG PO TABS
5.0000 mg | ORAL_TABLET | Freq: Every day | ORAL | Status: DC
  Administered 2015-07-31: 5 mg via ORAL
  Filled 2015-07-30 (×2): qty 1

## 2015-07-30 MED ORDER — DEXAMETHASONE SODIUM PHOSPHATE 10 MG/ML IJ SOLN
10.0000 mg | INTRAMUSCULAR | Status: AC
  Administered 2015-07-30: 10 mg via INTRAVENOUS
  Filled 2015-07-30: qty 1

## 2015-07-30 MED ORDER — HYDROCODONE-ACETAMINOPHEN 5-325 MG PO TABS
1.0000 | ORAL_TABLET | ORAL | Status: DC | PRN
  Administered 2015-07-30 – 2015-07-31 (×3): 2 via ORAL
  Filled 2015-07-30 (×3): qty 2

## 2015-07-30 MED ORDER — PRAVASTATIN SODIUM 40 MG PO TABS: 20.0000 mg | ORAL_TABLET | Freq: Every day | ORAL | Status: DC

## 2015-07-30 MED ORDER — OXYCODONE-ACETAMINOPHEN 5-325 MG PO TABS: 1.0000 | ORAL_TABLET | ORAL | Status: DC | PRN

## 2015-07-30 MED ORDER — LOSARTAN POTASSIUM 50 MG PO TABS
100.0000 mg | ORAL_TABLET | Freq: Every day | ORAL | Status: DC
  Administered 2015-07-31: 100 mg via ORAL
  Filled 2015-07-30: qty 2

## 2015-07-30 MED ORDER — 0.9 % SODIUM CHLORIDE (POUR BTL) OPTIME
TOPICAL | Status: DC | PRN
  Administered 2015-07-30: 1000 mL

## 2015-07-30 MED ORDER — FENTANYL CITRATE (PF) 250 MCG/5ML IJ SOLN
INTRAMUSCULAR | Status: AC
  Filled 2015-07-30: qty 5

## 2015-07-30 MED ORDER — ONDANSETRON HCL 4 MG/2ML IJ SOLN
4.0000 mg | INTRAMUSCULAR | Status: DC | PRN
  Administered 2015-07-30 – 2015-07-31 (×2): 4 mg via INTRAVENOUS
  Filled 2015-07-30 (×2): qty 2

## 2015-07-30 MED ORDER — ONDANSETRON HCL 4 MG/2ML IJ SOLN
INTRAMUSCULAR | Status: DC | PRN
  Administered 2015-07-30: 4 mg via INTRAVENOUS

## 2015-07-30 MED ORDER — PANTOPRAZOLE SODIUM 40 MG PO TBEC
40.0000 mg | DELAYED_RELEASE_TABLET | Freq: Every day | ORAL | Status: DC
  Administered 2015-07-31: 40 mg via ORAL
  Filled 2015-07-30: qty 1

## 2015-07-30 MED ORDER — HYDROMORPHONE HCL 1 MG/ML IJ SOLN
INTRAMUSCULAR | Status: AC
  Filled 2015-07-30: qty 1

## 2015-07-30 MED ORDER — AMLODIPINE BESYLATE 5 MG PO TABS: 5.0000 mg | ORAL_TABLET | Freq: Every day | ORAL | Status: DC

## 2015-07-30 MED ORDER — PROPOFOL 10 MG/ML IV BOLUS
INTRAVENOUS | Status: AC
  Filled 2015-07-30: qty 20

## 2015-07-30 MED ORDER — VANCOMYCIN HCL IN DEXTROSE 1-5 GM/200ML-% IV SOLN
INTRAVENOUS | Status: AC
  Filled 2015-07-30: qty 200

## 2015-07-30 MED ORDER — PROPOFOL 10 MG/ML IV BOLUS
INTRAVENOUS | Status: DC | PRN
  Administered 2015-07-30: 150 mg via INTRAVENOUS
  Administered 2015-07-30: 50 mg via INTRAVENOUS

## 2015-07-30 MED ORDER — MIDAZOLAM HCL 2 MG/2ML IJ SOLN
INTRAMUSCULAR | Status: AC
  Filled 2015-07-30: qty 2

## 2015-07-30 MED ORDER — HYDROMORPHONE HCL 1 MG/ML IJ SOLN
0.2500 mg | INTRAMUSCULAR | Status: DC | PRN
  Administered 2015-07-30 (×2): 0.5 mg via INTRAVENOUS

## 2015-07-30 MED ORDER — SODIUM CHLORIDE 0.9 % IR SOLN
Status: DC | PRN
  Administered 2015-07-30: 13:00:00

## 2015-07-30 MED ORDER — BACITRACIN ZINC 500 UNIT/GM EX OINT
TOPICAL_OINTMENT | CUTANEOUS | Status: DC | PRN
  Administered 2015-07-30: 1 via TOPICAL

## 2015-07-30 MED ORDER — OLOPATADINE HCL 0.1 % OP SOLN
1.0000 [drp] | Freq: Two times a day (BID) | OPHTHALMIC | Status: DC
  Administered 2015-07-30: 1 [drp] via OPHTHALMIC
  Filled 2015-07-30: qty 5

## 2015-07-30 MED ORDER — HEMOSTATIC AGENTS (NO CHARGE) OPTIME
TOPICAL | Status: DC | PRN
  Administered 2015-07-30: 1 via TOPICAL

## 2015-07-30 MED ORDER — PHENOL 1.4 % MT LIQD
1.0000 | OROMUCOSAL | Status: DC | PRN
Start: 2015-07-30 — End: 2015-07-31

## 2015-07-30 MED ORDER — MIDAZOLAM HCL 2 MG/2ML IJ SOLN
INTRAMUSCULAR | Status: DC | PRN
  Administered 2015-07-30: 2 mg via INTRAVENOUS

## 2015-07-30 MED ORDER — LACTATED RINGERS IV SOLN
INTRAVENOUS | Status: DC | PRN
  Administered 2015-07-30 (×2): via INTRAVENOUS

## 2015-07-30 MED ORDER — VANCOMYCIN HCL IN DEXTROSE 1-5 GM/200ML-% IV SOLN
1000.0000 mg | Freq: Once | INTRAVENOUS | Status: AC
  Administered 2015-07-30: 1000 mg via INTRAVENOUS
  Filled 2015-07-30: qty 200

## 2015-07-30 MED ORDER — METOCLOPRAMIDE HCL 5 MG/ML IJ SOLN: 10.0000 mg | Freq: Once | INTRAMUSCULAR | Status: DC | PRN

## 2015-07-30 MED ORDER — PHENYLEPHRINE HCL 10 MG/ML IJ SOLN
INTRAMUSCULAR | Status: DC | PRN
  Administered 2015-07-30 (×2): 80 ug via INTRAVENOUS
  Administered 2015-07-30 (×2): 120 ug via INTRAVENOUS

## 2015-07-30 MED ORDER — KETOROLAC TROMETHAMINE 30 MG/ML IJ SOLN
30.0000 mg | Freq: Four times a day (QID) | INTRAMUSCULAR | Status: DC
  Administered 2015-07-30 – 2015-07-31 (×3): 30 mg via INTRAVENOUS
  Filled 2015-07-30 (×3): qty 1

## 2015-07-30 MED ORDER — ACETAMINOPHEN 325 MG PO TABS: 650.0000 mg | ORAL_TABLET | ORAL | Status: DC | PRN

## 2015-07-30 MED ORDER — HYDROMORPHONE HCL 1 MG/ML IJ SOLN: 0.5000 mg | INTRAMUSCULAR | Status: DC | PRN

## 2015-07-30 MED ORDER — HYDROXYZINE HCL 50 MG/ML IM SOLN
50.0000 mg | Freq: Four times a day (QID) | INTRAMUSCULAR | Status: DC | PRN
  Administered 2015-07-30: 50 mg via INTRAMUSCULAR
  Filled 2015-07-30: qty 1

## 2015-07-30 MED ORDER — GOLIMUMAB 50 MG/0.5ML ~~LOC~~ SOAJ: SUBCUTANEOUS | Status: DC

## 2015-07-30 MED ORDER — CYCLOBENZAPRINE HCL 10 MG PO TABS: 10.0000 mg | ORAL_TABLET | Freq: Three times a day (TID) | ORAL | Status: DC | PRN

## 2015-07-30 MED ORDER — THROMBIN 5000 UNITS EX SOLR
CUTANEOUS | Status: DC | PRN
  Administered 2015-07-30 (×2): 5000 [IU] via TOPICAL

## 2015-07-30 MED ORDER — VITAMIN D 1000 UNITS PO TABS
2000.0000 [IU] | ORAL_TABLET | Freq: Every day | ORAL | Status: DC
  Administered 2015-07-31: 2000 [IU] via ORAL
  Filled 2015-07-30: qty 2

## 2015-07-30 MED ORDER — SODIUM CHLORIDE 0.9% FLUSH: 3.0000 mL | INTRAVENOUS | Status: DC | PRN

## 2015-07-30 MED ORDER — MEPERIDINE HCL 25 MG/ML IJ SOLN: 6.2500 mg | INTRAMUSCULAR | Status: DC | PRN

## 2015-07-30 MED ORDER — FENTANYL CITRATE (PF) 250 MCG/5ML IJ SOLN
INTRAMUSCULAR | Status: DC | PRN
  Administered 2015-07-30: 150 ug via INTRAVENOUS
  Administered 2015-07-30: 100 ug via INTRAVENOUS

## 2015-07-30 MED ORDER — VITAMIN D 50 MCG (2000 UT) PO TABS: 2000.0000 [IU] | ORAL_TABLET | Freq: Every day | ORAL | Status: DC

## 2015-07-30 MED ORDER — SUGAMMADEX SODIUM 500 MG/5ML IV SOLN
INTRAVENOUS | Status: DC | PRN
  Administered 2015-07-30: 200 mg via INTRAVENOUS

## 2015-07-30 MED ORDER — LOSARTAN POTASSIUM 100 MG PO TABS: 100.0000 mg | ORAL_TABLET | Freq: Every day | ORAL | Status: DC

## 2015-07-30 MED ORDER — KETOROLAC TROMETHAMINE 30 MG/ML IJ SOLN
INTRAMUSCULAR | Status: DC | PRN
  Administered 2015-07-30: 30 mg via INTRAVENOUS

## 2015-07-30 MED ORDER — MENTHOL 3 MG MT LOZG: 1.0000 | LOZENGE | OROMUCOSAL | Status: DC | PRN

## 2015-07-30 MED ORDER — ONE-DAILY MULTI VITAMINS PO TABS: 1.0000 | ORAL_TABLET | Freq: Every day | ORAL | Status: DC

## 2015-07-30 SURGICAL SUPPLY — 51 items
BAG DECANTER FOR FLEXI CONT (MISCELLANEOUS) ×2 IMPLANT
BENZOIN TINCTURE PRP APPL 2/3 (GAUZE/BANDAGES/DRESSINGS) ×2 IMPLANT
BLADE CLIPPER SURG (BLADE) IMPLANT
BRUSH SCRUB EZ PLAIN DRY (MISCELLANEOUS) ×2 IMPLANT
BUR MATCHSTICK NEURO 3.0 LAGG (BURR) ×2 IMPLANT
CANISTER SUCT 3000ML PPV (MISCELLANEOUS) ×2 IMPLANT
DERMABOND ADVANCED (GAUZE/BANDAGES/DRESSINGS) ×1
DERMABOND ADVANCED .7 DNX12 (GAUZE/BANDAGES/DRESSINGS) ×1 IMPLANT
DRAPE LAPAROTOMY 100X72 PEDS (DRAPES) ×2 IMPLANT
DRAPE MICROSCOPE LEICA (MISCELLANEOUS) IMPLANT
DRAPE POUCH INSTRU U-SHP 10X18 (DRAPES) ×2 IMPLANT
DURAPREP 26ML APPLICATOR (WOUND CARE) ×2 IMPLANT
ELECT REM PT RETURN 9FT ADLT (ELECTROSURGICAL) ×2
ELECTRODE REM PT RTRN 9FT ADLT (ELECTROSURGICAL) ×1 IMPLANT
GAUZE SPONGE 4X4 12PLY STRL (GAUZE/BANDAGES/DRESSINGS) ×2 IMPLANT
GAUZE SPONGE 4X4 16PLY XRAY LF (GAUZE/BANDAGES/DRESSINGS) IMPLANT
GLOVE BIOGEL PI IND STRL 6.5 (GLOVE) ×2 IMPLANT
GLOVE BIOGEL PI INDICATOR 6.5 (GLOVE) ×2
GLOVE ECLIPSE 9.0 STRL (GLOVE) ×2 IMPLANT
GLOVE EXAM NITRILE LRG STRL (GLOVE) IMPLANT
GLOVE EXAM NITRILE MD LF STRL (GLOVE) IMPLANT
GLOVE EXAM NITRILE XL STR (GLOVE) IMPLANT
GLOVE EXAM NITRILE XS STR PU (GLOVE) IMPLANT
GLOVE INDICATOR 7.0 STRL GRN (GLOVE) ×2 IMPLANT
GLOVE INDICATOR 7.5 STRL GRN (GLOVE) ×2 IMPLANT
GLOVE SURG SS PI 7.0 STRL IVOR (GLOVE) ×2 IMPLANT
GOWN STRL REUS W/ TWL LRG LVL3 (GOWN DISPOSABLE) IMPLANT
GOWN STRL REUS W/ TWL XL LVL3 (GOWN DISPOSABLE) ×1 IMPLANT
GOWN STRL REUS W/TWL 2XL LVL3 (GOWN DISPOSABLE) IMPLANT
GOWN STRL REUS W/TWL LRG LVL3 (GOWN DISPOSABLE)
GOWN STRL REUS W/TWL XL LVL3 (GOWN DISPOSABLE) ×1
KIT BASIN OR (CUSTOM PROCEDURE TRAY) ×2 IMPLANT
KIT ROOM TURNOVER OR (KITS) ×2 IMPLANT
LIQUID BAND (GAUZE/BANDAGES/DRESSINGS) ×2 IMPLANT
NEEDLE HYPO 22GX1.5 SAFETY (NEEDLE) ×2 IMPLANT
NEEDLE SPNL 22GX3.5 QUINCKE BK (NEEDLE) ×2 IMPLANT
NS IRRIG 1000ML POUR BTL (IV SOLUTION) ×2 IMPLANT
PACK LAMINECTOMY NEURO (CUSTOM PROCEDURE TRAY) ×2 IMPLANT
PAD ARMBOARD 7.5X6 YLW CONV (MISCELLANEOUS) ×6 IMPLANT
PIN MAYFIELD SKULL DISP (PIN) ×2 IMPLANT
RUBBERBAND STERILE (MISCELLANEOUS) IMPLANT
SPONGE LAP 4X18 X RAY DECT (DISPOSABLE) IMPLANT
SPONGE SURGIFOAM ABS GEL SZ50 (HEMOSTASIS) ×2 IMPLANT
STRIP CLOSURE SKIN 1/2X4 (GAUZE/BANDAGES/DRESSINGS) ×2 IMPLANT
SUT VIC AB 0 CT1 18XCR BRD8 (SUTURE) ×1 IMPLANT
SUT VIC AB 0 CT1 8-18 (SUTURE) ×1
SUT VIC AB 2-0 CT2 18 VCP726D (SUTURE) ×2 IMPLANT
SUT VIC AB 3-0 SH 8-18 (SUTURE) ×2 IMPLANT
TOWEL OR 17X24 6PK STRL BLUE (TOWEL DISPOSABLE) ×2 IMPLANT
TOWEL OR 17X26 10 PK STRL BLUE (TOWEL DISPOSABLE) ×2 IMPLANT
WATER STERILE IRR 1000ML POUR (IV SOLUTION) ×2 IMPLANT

## 2015-07-30 NOTE — Transfer of Care (Signed)
Immediate Anesthesia Transfer of Care Note  Patient: Jade Cardenas  Procedure(s) Performed: Procedure(s): Laminectomy and Foraminotomy - left - Cervical two -Cervical three (Left)  Patient Location: PACU  Anesthesia Type:General  Level of Consciousness: sedated and patient cooperative  Airway & Oxygen Therapy: Patient Spontanous Breathing  Post-op Assessment: Report given to RN and Post -op Vital signs reviewed and stable  Post vital signs: Reviewed and stable  Last Vitals:  Filed Vitals:   07/30/15 1042  Pulse: 95  Temp: 36.8 C  Resp: 20    Last Pain: There were no vitals filed for this visit.       Complications: No apparent anesthesia complications

## 2015-07-30 NOTE — Anesthesia Postprocedure Evaluation (Signed)
Anesthesia Post Note  Patient: Jade Cardenas  Procedure(s) Performed: Procedure(s) (LRB): Laminectomy and Foraminotomy - left - Cervical two -Cervical three (Left)  Patient location during evaluation: PACU Anesthesia Type: General Level of consciousness: awake and alert and oriented Pain management: pain level controlled Vital Signs Assessment: post-procedure vital signs reviewed and stable Respiratory status: spontaneous breathing, nonlabored ventilation and respiratory function stable Cardiovascular status: blood pressure returned to baseline and stable Postop Assessment: no signs of nausea or vomiting Anesthetic complications: no    Last Vitals:  Filed Vitals:   07/30/15 1538 07/30/15 1557  BP:  139/64  Pulse:  77  Temp: 36.2 C 36.7 C  Resp:  16    Last Pain:  Filed Vitals:   07/30/15 1613  PainSc: Asleep                 Tatem Fesler A.

## 2015-07-30 NOTE — H&P (Signed)
Jade Cardenas is an 65 y.o. female.   Chief Complaint: Neck pain HPI: 65 year old female status post previous C3-4 and C5-6 anterior cervical decompression and fusions with good results. Patient with persistent left-sided neck pain worsened by turning her head toward the left or with extension. Workup demonstrates evidence of spondylosis with a small associated synovial cyst on the left at C2-3. Patient has failed conservative management and presents now for decompressive laminotomy and resection of synovial cyst in hopes of improving her symptoms. She has no symptoms of weakness or sensory loss.  Past Medical History  Diagnosis Date  . HYPERLIPIDEMIA 12/29/2006    takes Lovastatin nightly  . Right bundle branch block 02/09/2007  . IBS 04/28/2007  . MENOPAUSAL DISORDER 12/07/2007  . Rheumatoid arthritis(714.0) 12/29/2006  . SYNCOPE 12/07/2007  . WEIGHT GAIN 12/18/2008  . Palpitations 02/09/2007  . UNS ADVRS EFF OTH RX MEDICINAL\T\BIOLOGICAL SBSTNC 02/09/2007  . GASTRIC ULCER, ACUTE, HEMORRHAGE, HX OF 02/09/2007  . COLONIC POLYPS, HX OF 02/09/2007  . REDUCTION MAMMOPLASTY, HX OF 02/09/2007  . HYPERTENSION 12/29/2006    takes Enalapril daily  . Headache(784.0)     occasionally  . CHOLECYSTECTOMY, HX OF   . Fibromyalgia     doesn't require meds  . Joint pain   . Joint swelling   . Chronic back pain   . Bruises easily   . H/O hiatal hernia   . GERD (gastroesophageal reflux disease)     takes Nexium daily  . UTI (lower urinary tract infection)     hx of;saw urologist last yr and he told her she was over reacting and not a big deal   . Anemia     iron def  . Hx of seasonal allergies     takes Claritin daily  . Insomnia     related to pain;takes Flexeril and Tylenol PM nightly  . S/P lumbar fusion 6 13 09/03/2011    L4 L5  posterior   . Cough, persistent 01/21/2012    poss from acei   . 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 Abnormal Pap smear     H/O  . Goiter   . Arthritis      rheumatoid and osteo now flt to be psoriateic   . Arthritis     psoreratic, osteoarthritis  . Broken toe   . PONV (postoperative nausea and vomiting)     NAUSEA  . History of bronchitis     Past Surgical History  Procedure Laterality Date  . Breast reduction surgery      last 80's  . Cervical fusion      2 12   . Tonsillectomy      as a child  . Abdominal hysterectomy  1986    TAH/BSO  . Cholecystectomy  1996  . Back surgery  12+yrs ago    Synovial Cyst removal  . Bone spur  6-66yrs ago    right ankle  . Colonoscopy    . Back surgery  6/13    lumbar L4-L5 fusion, lamenectomy  . Blood transfused  6/13    transfused 2 units post op from back surgery  . Bunionectomy  10/03/14    right foot  . Esophagogastroduodenoscopy endoscopy      Family History  Problem Relation Age of Onset  . Diabetes Mother   . Colon cancer Mother     x2  . Arthritis Mother   . Hypertension Mother   . Stroke Father   . Heart disease  Father   . Arthritis Brother   . Hypertension Brother   . Hyperlipidemia Brother   . Anesthesia problems Neg Hx   . Hypotension Neg Hx   . Malignant hyperthermia Neg Hx   . Pseudochol deficiency Neg Hx   . Other      DISH brother  . Endometrial cancer Mother   . Colon cancer Father   . Hypertension Maternal Grandmother   . Stroke Maternal Grandmother   . Stroke Father   . Infertility Brother    Social History:  reports that she has never smoked. She has never used smokeless tobacco. She reports that she does not drink alcohol or use illicit drugs.  Allergies:  Allergies  Allergen Reactions  . Erythromycin Hives, Swelling and Other (See Comments)    All Mycins; throat swells shut and she gets "really hot"  . Iodinated Diagnostic Agents Swelling    CT contrast; throat swelling, difficulty breathing.  In [place] "many years ago."  . Ace Inhibitors Cough  . Keflex [Cephalexin] Hives    hives  . Macrobid [Nitrofurantoin] Other (See Comments)     dizziness    Medications Prior to Admission  Medication Sig Dispense Refill  . acetaminophen (TYLENOL) 650 MG CR tablet Take 650 mg by mouth as needed for pain.    . Cholecalciferol (VITAMIN D) 2000 units tablet Take 2,000 Units by mouth daily.    . Diphenhydramine-APAP, sleep, (TYLENOL PM EXTRA STRENGTH PO) Take 2 tablets by mouth at bedtime as needed. For sleep    . esomeprazole (NEXIUM) 40 MG capsule Take 1 capsule (40 mg total) by mouth daily before breakfast. 30 capsule 11  . lovastatin (MEVACOR) 20 MG tablet Take 1 tablet (20 mg total) by mouth at bedtime. 90 tablet 3  . Multiple Vitamin (MULTIVITAMIN) tablet Take 1 tablet by mouth daily.      . NONFORMULARY OR COMPOUNDED [place] compound:  Antiinflammatory cream - Diclofencac 3%, Baclofen 2%, Cyclobenzaprine 2%, Lidocaine 2% dispense 120 gram, apply 1-2 grams to affected area 3-4 times daily, +2 refills. (Patient taking differently: [place] compound:  Antiinflammatory cream - Diclofencac 3%, Baclofen 2%, Cyclobenzaprine 2%, Lidocaine 2% dispense 120 gram, apply 1-2 grams to affected area 3-4 times daily as needed refills.) 120 each 2  . olopatadine (PATANOL) 0.1 % ophthalmic solution Place 1 drop into both eyes daily as needed.   2  . SIMPONI 50 MG/0.5ML SOAJ Inject into the skin every 30 (thirty) days.   1    No results found for this or any previous visit (from the past 48 hour(s)). No results found.  Pertinent items noted in HPI and remainder of comprehensive ROS otherwise negative.  Pulse 95, temperature 98.3 F (36.8 C), temperature source Oral, resp. rate 20, height 5\' 4"  (1.626 m), weight 97.523 kg (215 lb), last menstrual period 03/02/1984, SpO2 94 %.  Patient is awake and alert. She is oriented and appropriate. Her cranial nerve function is intact. Her speech is fluent. His jet her judgment and insight are intact. Motor and sensory function of the extremities are normal. Chest and abdomen are benign.  Extremities are free from injury or deformity. Assessment/Plan Left C2-3 spondylosis with associated synovial cyst causing left C3 radiculopathy. Plan left C2-C3 decompressive laminotomy and foraminotomies with resection of synovial cyst. Risks and benefits been explained. Patient wishes to proceed.  Lydian Chavous A 07/30/2015, 11:56 AM

## 2015-07-30 NOTE — Anesthesia Procedure Notes (Signed)
Procedure Name: Intubation Date/Time: 07/30/2015 12:47 PM Performed by: Lance Coon Pre-anesthesia Checklist: Patient identified, Timeout performed, Emergency Drugs available, Patient being monitored and Suction available Patient Re-evaluated:Patient Re-evaluated prior to inductionOxygen Delivery Method: Circle system utilized Preoxygenation: Pre-oxygenation with 100% oxygen Intubation Type: IV induction Ventilation: Mask ventilation without difficulty Laryngoscope Size: Miller and 2 Grade View: Grade II Tube type: Oral Tube size: 7.0 mm Number of attempts: 1 Airway Equipment and Method: Stylet Placement Confirmation: ETT inserted through vocal cords under direct vision,  positive ETCO2 and breath sounds checked- equal and bilateral Secured at: 21 cm Tube secured with: Tape Dental Injury: Teeth and Oropharynx as per pre-operative assessment

## 2015-07-30 NOTE — Anesthesia Preprocedure Evaluation (Addendum)
Anesthesia Evaluation  Patient identified by MRN, date of birth, ID band Patient awake    Reviewed: Allergy & Precautions, NPO status , Patient's Chart, lab work & pertinent test results  History of Anesthesia Complications (+) PONV and history of anesthetic complications  Airway Mallampati: II  TM Distance: >3 FB Neck ROM: Limited    Dental  (+) Teeth Intact, Caps   Pulmonary neg pulmonary ROS,    Pulmonary exam normal breath sounds clear to auscultation       Cardiovascular hypertension, Pt. on medications Normal cardiovascular exam+ dysrhythmias  Rhythm:Regular Rate:Normal  Incomplete RBBB   Neuro/Psych  Headaches,  Neuromuscular disease negative psych ROS   GI/Hepatic Neg liver ROS, hiatal hernia, GERD  Medicated and Controlled,IBS   Endo/Other  Obesity Hyperlipidemia  Renal/GU negative Renal ROS  negative genitourinary   Musculoskeletal  (+) Arthritis , Fibromyalgia -Cervical Stenosis Psoriatic arthritis   Abdominal (+) + obese,   Peds  Hematology  (+) anemia ,   Anesthesia Other Findings   Reproductive/Obstetrics                           Anesthesia Physical Anesthesia Plan  ASA: III  Anesthesia Plan: General   Post-op Pain Management:    Induction: Intravenous  Airway Management Planned: Oral ETT  Additional Equipment:   Intra-op Plan:   Post-operative Plan: Extubation in OR  Informed Consent: I have reviewed the patients History and Physical, chart, labs and discussed the procedure including the risks, benefits and alternatives for the proposed anesthesia with the patient or authorized representative who has indicated his/her understanding and acceptance.   Dental advisory given  Plan Discussed with: CRNA, Surgeon and Anesthesiologist  Anesthesia Plan Comments:         Anesthesia Quick Evaluation

## 2015-07-30 NOTE — Op Note (Signed)
Date of procedure: 07/30/2015  Date of dictation: Same  Service: Neurosurgery  Preoperative diagnosis: Left C2-3 spondylosis with associated adherent synovial cyst with radiculopathy  Postoperative diagnosis: Same  Procedure Name: Left C2-C3 decompressive laminotomy and foraminotomy with resection of adherent synovial cyst, microdissection  Surgeon:Jaemarie Hochberg A.Arika Mainer, M.D.  Asst. Surgeon: Ditty  Anesthesia: General  Indication: 65 year old female status post previous C3-4 and C5-6 anterior cervical fusions with good results presents with persistent left-sided neck pain failing conservative management. Workup demonstrates evidence of progressive spondylosis with associated synovial cyst at C2-3 on the left. Patient presents now for decompressive surgery with resection of synovial cyst.  Operative note: After induction of anesthesia, patient positioned prone onto bolsters with her head fixed in a neutral head position using a [place] pin headrest. Patient's posterior cervical region prepped and draped sterilely. Incision made overlying C2-C3. Dissection performed on the left. Retractor placed. X-ray taken. Global confirmed. Decompressive laminotomy and foraminotomy performed using a high-speed drill and Kerrison rongeurs to remove the inferior aspect of the lamina of C2 the medial aspect of the C2-3 facet joint and the superior rim of the C3 lamina. Ligament flavum was elevated and resected. Underlying thecal sac was identified. Judd Gaudier Achilles are microdissection. The proximal left C3 nerve root and the adherent synovial cyst was identified. This is dissected free using microdissection and complete resected. Foraminotomy was then completed on the Corsi exiting C3 nerve root. There is no evidence of injury to thecal sac or bruits. The wound was then irrigated with Anaprox solution. Gelfoam was placed topically for hemostasis which is now to be good. Microscope versus more removed. Hemostasis of the  muscle was achieved with cautery. Wounds and closed in layers with Vicryl sutures. Steri-Strips and sterile dressing was applied. No apparent palpitations. Patient tolerated the procedure well and she returns to the recovery room postop.

## 2015-07-30 NOTE — Brief Op Note (Signed)
07/30/2015  2:07 PM  PATIENT:  [name]  65 y.o. female  PRE-OPERATIVE DIAGNOSIS:  Stenosis  POST-OPERATIVE DIAGNOSIS:  Stenosis  PROCEDURE:  Procedure(s): Laminectomy and Foraminotomy - left - Cervical two -Cervical three (Left)  SURGEON:  Surgeon(s) and Role:    * Earnie Larsson, MD - Primary    * Kevan Ny Ditty, MD - Assisting  PHYSICIAN ASSISTANT:   ASSISTANTS:    ANESTHESIA:   general  EBL:  Total I/O In: 1200 [I.V.:1200] Out: 100 [Blood:100]  BLOOD ADMINISTERED:none  DRAINS: none   LOCAL MEDICATIONS USED:  MARCAINE     SPECIMEN:  No Specimen  DISPOSITION OF SPECIMEN:  N/A  COUNTS:  YES  TOURNIQUET:  * No tourniquets in log *  DICTATION: .Dragon Dictation  PLAN OF CARE: Admit for overnight observation  PATIENT DISPOSITION:  PACU - hemodynamically stable.   Delay start of Pharmacological VTE agent (>24hrs) due to surgical blood loss or risk of bleeding: yes

## 2015-07-31 DIAGNOSIS — M4722 Other spondylosis with radiculopathy, cervical region: Secondary | ICD-10-CM | POA: Diagnosis not present

## 2015-07-31 MED ORDER — HYDROCODONE-ACETAMINOPHEN 5-325 MG PO TABS: 1.0000 | ORAL_TABLET | ORAL | Status: DC | PRN

## 2015-07-31 MED ORDER — CYCLOBENZAPRINE HCL 10 MG PO TABS: 10.0000 mg | ORAL_TABLET | Freq: Three times a day (TID) | ORAL | Status: DC | PRN

## 2015-07-31 NOTE — Progress Notes (Signed)
Patient alert and oriented, mae's well, voiding adequate amount of urine, swallowing without difficulty, no c/o pain. Patient discharged home with family. Script and discharged instructions given to patient. Patient and family stated understanding of d/c instructions given and has an appointment with MD. 

## 2015-07-31 NOTE — Discharge Summary (Signed)
Physician Discharge Summary  Patient ID: Jade Cardenas MRN: [id number] DOB/AGE: [birth year] 65 y.o.  Admit date: 07/30/2015 Discharge date: 07/31/2015  Admission Diagnoses:  Discharge Diagnoses:  Active Problems:   Foraminal stenosis of cervical region   Discharged Condition: good  Hospital Course: The patient was admitted to the hospital where she underwent an uncomplicated posterior cervical decompression. Postoperatively she is doing well. She is ambulating without difficulty. Her pain is well-controlled. She is ready for discharge home.  Consults:   Significant Diagnostic Studies:   Treatments:   Discharge Exam: Blood pressure 151/59, pulse 76, temperature 97.6 F (36.4 C), temperature source Oral, resp. rate 18, height 5\' 4"  (1.626 m), weight 97.523 kg (215 lb), last menstrual period 03/02/1984, SpO2 100 %. Awake and alert. Oriented and appropriate. Cranial nerve function intact. Motor and sensory function extremities intact. Wound clean and dry. Chest and abdomen benign.  Disposition: 01-Home or Self Care     Medication List    TAKE these medications        acetaminophen 650 MG CR tablet  Commonly known as:  TYLENOL  Take 650 mg by mouth as needed for pain.     amLODipine 5 MG tablet  Commonly known as:  NORVASC  Take 1 tablet (5 mg total) by mouth daily.     cyclobenzaprine 10 MG tablet  Commonly known as:  FLEXERIL  Take 1 tablet (10 mg total) by mouth 3 (three) times daily as needed for muscle spasms.     esomeprazole 40 MG capsule  Commonly known as:  NEXIUM  Take 1 capsule (40 mg total) by mouth daily before breakfast.     HYDROcodone-acetaminophen 5-325 MG tablet  Commonly known as:  NORCO/VICODIN  Take 1-2 tablets by mouth every 4 (four) hours as needed (mild pain).     losartan 100 MG tablet  Commonly known as:  COZAAR  Take 1 tablet (100 mg total) by mouth daily.     lovastatin 20 MG tablet  Commonly known as:  MEVACOR  Take 1 tablet (20  mg total) by mouth at bedtime.     multivitamin tablet  Take 1 tablet by mouth daily.     NONFORMULARY OR COMPOUNDED [place] compound:  Antiinflammatory cream - Diclofencac 3%, Baclofen 2%, Cyclobenzaprine 2%, Lidocaine 2% dispense 120 gram, apply 1-2 grams to affected area 3-4 times daily, +2 refills.     olopatadine 0.1 % ophthalmic solution  Commonly known as:  PATANOL  Place 1 drop into both eyes daily as needed.     SIMPONI 50 MG/0.5ML Soaj  Generic drug:  Golimumab  Inject into the skin every 30 (thirty) days.     TYLENOL PM EXTRA STRENGTH PO  Take 2 tablets by mouth at bedtime as needed. For sleep     Vitamin D 2000 units tablet  Take 2,000 Units by mouth daily.           Follow-up Information    Follow up with Charlie Pitter, MD.   Specialty:  Neurosurgery   Contact information:   [address] [place] [address] [phone]       Signed: Charlie Pitter 07/31/2015, 8:42 AM

## 2015-07-31 NOTE — Discharge Instructions (Signed)

## 2015-08-01 ENCOUNTER — Encounter (HOSPITAL_COMMUNITY): Payer: Self-pay | Admitting: Neurosurgery

## 2015-08-07 ENCOUNTER — Other Ambulatory Visit (HOSPITAL_BASED_OUTPATIENT_CLINIC_OR_DEPARTMENT_OTHER): Payer: BLUE CROSS/BLUE SHIELD

## 2015-08-07 DIAGNOSIS — D509 Iron deficiency anemia, unspecified: Secondary | ICD-10-CM

## 2015-08-07 LAB — CBC & DIFF AND RETIC
BASO%: 0.5 % (ref 0.0–2.0)
Basophils Absolute: 0 10*3/uL (ref 0.0–0.1)
EOS%: 1.5 % (ref 0.0–7.0)
Eosinophils Absolute: 0.1 10*3/uL (ref 0.0–0.5)
HCT: 36.1 % (ref 34.8–46.6)
HGB: 12.4 g/dL (ref 11.6–15.9)
Immature Retic Fract: 7.5 % (ref 1.60–10.00)
LYMPH%: 28.2 % (ref 14.0–49.7)
MCH: 28.8 pg (ref 25.1–34.0)
MCHC: 34.3 g/dL (ref 31.5–36.0)
MCV: 83.8 fL (ref 79.5–101.0)
MONO#: 0.4 10*3/uL (ref 0.1–0.9)
MONO%: 5.9 % (ref 0.0–14.0)
NEUT#: 3.9 10*3/uL (ref 1.5–6.5)
NEUT%: 63.9 % (ref 38.4–76.8)
Platelets: 337 10*3/uL (ref 145–400)
RBC: 4.31 10*6/uL (ref 3.70–5.45)
RDW: 19.6 % — ABNORMAL HIGH (ref 11.2–14.5)
Retic %: 1.14 % (ref 0.70–2.10)
Retic Ct Abs: 49.13 10*3/uL (ref 33.70–90.70)
WBC: 6.1 10*3/uL (ref 3.9–10.3)
lymph#: 1.7 10*3/uL (ref 0.9–3.3)

## 2015-08-07 LAB — COMPREHENSIVE METABOLIC PANEL
ALT: 13 U/L (ref 0–55)
AST: 14 U/L (ref 5–34)
Albumin: 4 g/dL (ref 3.5–5.0)
Alkaline Phosphatase: 99 U/L (ref 40–150)
Anion Gap: 9 mEq/L (ref 3–11)
BUN: 20 mg/dL (ref 7.0–26.0)
CO2: 26 mEq/L (ref 22–29)
Calcium: 10 mg/dL (ref 8.4–10.4)
Chloride: 105 mEq/L (ref 98–109)
Creatinine: 0.9 mg/dL (ref 0.6–1.1)
EGFR: 73 mL/min/{1.73_m2} — ABNORMAL LOW (ref >90)
Glucose: 94 mg/dl (ref 70–140)
Potassium: 4.4 mEq/L (ref 3.5–5.1)
Sodium: 140 mEq/L (ref 136–145)
Total Bilirubin: 0.44 mg/dL (ref 0.20–1.20)
Total Protein: 8.1 g/dL (ref 6.4–8.3)

## 2015-08-07 LAB — IRON AND TIBC
%SAT: 17 % — ABNORMAL LOW (ref 21–57)
Iron: 53 ug/dL (ref 41–142)
TIBC: 310 ug/dL (ref 236–444)
UIBC: 258 ug/dL (ref 120–384)

## 2015-08-07 LAB — FERRITIN: Ferritin: 46 ng/ml (ref 9–269)

## 2015-08-14 ENCOUNTER — Telehealth: Payer: Self-pay | Admitting: Hematology

## 2015-08-14 ENCOUNTER — Encounter: Payer: Self-pay | Admitting: Hematology

## 2015-08-14 ENCOUNTER — Ambulatory Visit (HOSPITAL_BASED_OUTPATIENT_CLINIC_OR_DEPARTMENT_OTHER): Payer: BLUE CROSS/BLUE SHIELD | Admitting: Hematology

## 2015-08-14 ENCOUNTER — Other Ambulatory Visit: Payer: BLUE CROSS/BLUE SHIELD

## 2015-08-14 VITALS — BP 136/48 | HR 80 | Temp 98.0°F | Resp 18 | Ht 64.0 in | Wt 217.1 lb

## 2015-08-14 DIAGNOSIS — E611 Iron deficiency: Secondary | ICD-10-CM | POA: Diagnosis not present

## 2015-08-14 DIAGNOSIS — E559 Vitamin D deficiency, unspecified: Secondary | ICD-10-CM | POA: Diagnosis not present

## 2015-08-14 DIAGNOSIS — D509 Iron deficiency anemia, unspecified: Secondary | ICD-10-CM

## 2015-08-14 DIAGNOSIS — D500 Iron deficiency anemia secondary to blood loss (chronic): Secondary | ICD-10-CM | POA: Insufficient documentation

## 2015-08-14 DIAGNOSIS — E538 Deficiency of other specified B group vitamins: Secondary | ICD-10-CM

## 2015-08-14 NOTE — Progress Notes (Signed)
Jade Cardenas    HEMATOLOGY/ONCOLOGY CLINIC NOTE  Date of Service: 08/14/2015  Patient Care Team: [name], MD as PCP - General Richmond Campbell, MD (Gastroenterology) Megan Salon, MD (Gynecology) Josue Hector, MD (Cardiology) Bo Merino, MD as Consulting Physician (Rheumatology) Brunetta Genera, MD as Consulting Physician (Hematology)  CHIEF COMPLAINTS/PURPOSE OF CONSULTATION:  Fatigue and severe iron deficiency anemia Oral iron intolerance  INTERVAL HISTORY  [name] is here for her scheduled follow-up for iron deficiency anemia. She reports that she had an EGD and colonoscopy with Dr. [name] in April which showed a large gastric ulcer with no overt bleeding. She reports that she was told it was benign and negative for Helicobacter pylori. She also had a colonoscopy that showed no significant polyps or evidence of bleeding. Her Nexium dose was increased and she is scheduled for a repeat endoscopy at the end of June.  Patient reports that she felt much better after the IV iron and her hemoglobin responded very well. She had cervical spinal decompression a few weeks back and is healing well from surgery.  Reports some mild fatigue after surgery. No overt evidence of gastrointestinal bleeding.  She had recent labs with her primary care physician that showed her hemoglobin was 12.4 with resolution of her microcytosis. Ferritin had improved from 4 to 46.  No other acute new symptoms.  MEDICAL HISTORY:  Past Medical History  Diagnosis Date  . HYPERLIPIDEMIA 12/29/2006    takes Lovastatin nightly  . Right bundle branch block 02/09/2007  . IBS 04/28/2007  . MENOPAUSAL DISORDER 12/07/2007  . Rheumatoid arthritis(714.0) 12/29/2006  . SYNCOPE 12/07/2007  . WEIGHT GAIN 12/18/2008  . Palpitations 02/09/2007  . UNS ADVRS EFF OTH RX MEDICINAL\T\BIOLOGICAL SBSTNC 02/09/2007  . GASTRIC ULCER, ACUTE, HEMORRHAGE, HX OF 02/09/2007  . COLONIC POLYPS, HX OF 02/09/2007  . REDUCTION  MAMMOPLASTY, HX OF 02/09/2007  . HYPERTENSION 12/29/2006    takes Enalapril daily  . Headache(784.0)     occasionally  . CHOLECYSTECTOMY, HX OF   . Fibromyalgia     doesn't require meds  . Joint pain   . Joint swelling   . Chronic back pain   . Bruises easily   . H/O hiatal hernia   . GERD (gastroesophageal reflux disease)     takes Nexium daily  . UTI (lower urinary tract infection)     hx of;saw urologist last yr and he told her she was over reacting and not a big deal   . Anemia     iron def  . Hx of seasonal allergies     takes Claritin daily  . Insomnia     related to pain;takes Flexeril and Tylenol PM nightly  . S/P lumbar fusion 6 13 09/03/2011    L4 L5  posterior   . Cough, persistent 01/21/2012    poss from acei   . 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 Abnormal Pap smear     H/O  . Goiter   . Arthritis     rheumatoid and osteo now flt to be psoriateic   . Arthritis     psoreratic, osteoarthritis  . Broken toe   . PONV (postoperative nausea and vomiting)     NAUSEA  . History of bronchitis     SURGICAL HISTORY: Past Surgical History  Procedure Laterality Date  . Breast reduction surgery      last 80's  . Cervical fusion      2 12   . Tonsillectomy  as a child  . Abdominal hysterectomy  1986    TAH/BSO  . Cholecystectomy  1996  . Back surgery  12+yrs ago    Synovial Cyst removal  . Bone spur  6-27yrs ago    right ankle  . Colonoscopy    . Back surgery  6/13    lumbar L4-L5 fusion, lamenectomy  . Blood transfused  6/13    transfused 2 units post op from back surgery  . Bunionectomy  10/03/14    right foot  . Esophagogastroduodenoscopy endoscopy    . Posterior cervical laminectomy Left 07/30/2015    Procedure: Laminectomy and Foraminotomy - left - Cervical two -Cervical three;  Surgeon: Earnie Larsson, MD;  Location: [facility];  Service: Neurosurgery;  Laterality: Left;    SOCIAL HISTORY: Social History   Social History  . Marital Status:  Widowed    Spouse Name: N/A  . Number of Children: N/A  . Years of Education: N/A   Occupational History  . Not on file.   Social History Main Topics  . Smoking status: Never Smoker   . Smokeless tobacco: Never Used  . Alcohol Use: No  . Drug Use: No  . Sexual Activity: No     Comment: TAH/BSO   Other Topics Concern  . 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 Diabetes Mother   . Colon cancer Mother     x2  . Arthritis Mother   . Hypertension Mother   . Stroke Father   . Heart disease Father   . Arthritis Brother   . Hypertension Brother   . Hyperlipidemia Brother   . Anesthesia problems Neg Hx   . Hypotension Neg Hx   . Malignant hyperthermia Neg Hx   . Pseudochol deficiency Neg Hx   . Other      DISH brother  . Endometrial cancer Mother   . Colon cancer Father   . Hypertension Maternal Grandmother   . Stroke Maternal Grandmother   . Stroke Father   . Infertility Brother     ALLERGIES:  is allergic to erythromycin; iodinated diagnostic agents; ace inhibitors; dilaudid; morphine and related; keflex; and macrobid.  MEDICATIONS:  Current Outpatient Prescriptions  Medication Sig Dispense Refill  . acetaminophen (TYLENOL) 650 MG CR tablet Take 650 mg by mouth as needed for pain.    Jade Cardenas amLODipine (NORVASC) 5 MG tablet Take 1 tablet (5 mg total) by mouth daily. 90 tablet 1  . Cholecalciferol (VITAMIN D) 2000 units tablet Take 2,000 Units by mouth daily.    . Diphenhydramine-APAP, sleep, (TYLENOL PM EXTRA STRENGTH PO) Take 2 tablets by mouth at bedtime as needed. For sleep    . esomeprazole (NEXIUM) 40 MG capsule Take 1 capsule (40 mg total) by mouth daily before breakfast. 30 capsule 11  . losartan (COZAAR) 100 MG tablet Take 1 tablet (100 mg total) by mouth  daily. 90 tablet 1  . lovastatin (MEVACOR) 20 MG tablet Take 1 tablet (20 mg total) by mouth at bedtime. 90 tablet 3  . Multiple Vitamin (MULTIVITAMIN) tablet Take 1 tablet by mouth daily.      Jade Cardenas SIMPONI 50 MG/0.5ML SOAJ Inject into the skin every 30 (thirty) days.   1   No current facility-administered  medications for this visit.    REVIEW OF SYSTEMS:    10 Point review of Systems was done is negative except as noted above.  PHYSICAL EXAMINATION: ECOG PERFORMANCE STATUS: 2 - Symptomatic, <50% confined to bed  . Filed Vitals:   08/14/15 1027  BP: 136/48  Pulse: 80  Temp: 98 F (36.7 C)  Resp: 18   Filed Weights   08/14/15 1027  Weight: 217 lb 1.6 oz (98.476 kg)   .Body mass index is 37.25 kg/(m^2).  GENERAL:alert, in no acute distress and comfortable ,somewhat fatigued appearing SKIN: skin color, texture, turgor are normal, no rashes or significant lesions EYES: mild conjunctival pallor no scleral icterus OROPHARYNX:no exudate, no erythema and lips, buccal mucosa, and tongue normal  NECK: supple, no JVD, thyroid normal size, non-tender, without nodularity LYMPH:  no palpable lymphadenopathy in the cervical, axillary or inguinal LUNGS: clear to auscultation with normal respiratory effort HEART: regular rate & rhythm,  no murmurs and no lower extremity edema ABDOMEN: abdomen soft, non-tender, normoactive bowel sounds  Musculoskeletal: no cyanosis of digits and no clubbing  PSYCH: alert & oriented x 3 with fluent speech NEURO: no focal motor/sensory deficits  LABORATORY DATA:  I have reviewed the data as listed  . CBC Latest Ref Rng 08/07/2015 07/18/2015 05/22/2015  WBC 3.9 - 10.3 10e3/uL 6.1 5.5 7.4  Hemoglobin 11.6 - 15.9 g/dL 12.4 12.4 7.2(L)  Hematocrit 34.8 - 46.6 % 36.1 37.6 24.5(L)  Platelets 145 - 400 10e3/uL 337 321 393   . CBC    Component Value Date/Time   WBC 6.1 08/07/2015 1021   WBC 5.5 07/18/2015 1052   RBC 4.31 08/07/2015 1021   RBC 4.55 07/18/2015  1052   HGB 12.4 08/07/2015 1021   HGB 12.4 07/18/2015 1052   HCT 36.1 08/07/2015 1021   HCT 37.6 07/18/2015 1052   PLT 337 08/07/2015 1021   PLT 321 07/18/2015 1052   MCV 83.8 08/07/2015 1021   MCV 82.6 07/18/2015 1052   MCH 28.8 08/07/2015 1021   MCH 27.3 07/18/2015 1052   MCHC 34.3 08/07/2015 1021   MCHC 33.0 07/18/2015 1052   RDW 19.6* 08/07/2015 1021   RDW 22.6* 07/18/2015 1052   LYMPHSABS 1.7 08/07/2015 1021   LYMPHSABS 1.5 07/18/2015 1052   MONOABS 0.4 08/07/2015 1021   MONOABS 0.3 07/18/2015 1052   EOSABS 0.1 08/07/2015 1021   EOSABS 0.1 07/18/2015 1052   BASOSABS 0.0 08/07/2015 1021   BASOSABS 0.1 07/18/2015 1052    . Lab Results  Component Value Date   RETICCTPCT 1.14 08/07/2015   RBC 4.31 08/07/2015   RETICCTABS 49.13 08/07/2015    . CMP Latest Ref Rng 08/07/2015 07/18/2015 05/22/2015  Glucose 70 - 140 mg/dl 94 108(H) 125  BUN 7.0 - 26.0 mg/dL 20.0 14 18.9  Creatinine 0.6 - 1.1 mg/dL 0.9 0.74 0.9  Sodium 136 - 145 mEq/L 140 141 141  Potassium 3.5 - 5.1 mEq/L 4.4 4.5 4.1  Chloride 101 - 111 mmol/L - 105 -  CO2 22 - 29 mEq/L 26 26 25   Calcium 8.4 - 10.4 mg/dL 10.0 9.6 9.4  Total Protein 6.4 - 8.3 g/dL 8.1 - 7.7  Total Bilirubin 0.20 - 1.20 mg/dL 0.44 - 0.30  Alkaline Phos 40 - 150 U/L 99 - 102  AST 5 - 34 U/L 14 - 14  ALT 0 - 55 U/L 13 - 13   . Lab Results  Component Value Date   IRON 53 08/07/2015   TIBC 310 08/07/2015  IRONPCTSAT 17* 08/07/2015   (Iron and TIBC)  Lab Results  Component Value Date   FERRITIN 46 08/07/2015    RADIOGRAPHIC STUDIES: I have personally reviewed the radiological images as listed and agreed with the findings in the report. Dg Cervical Spine 1 View  07/30/2015  CLINICAL DATA:  Localization for C2-3 laminectomy EXAM: CERVICAL SPINE 1 VIEW COMPARISON:  None. FINDINGS: Single lateral view for localization. Surgical probe posterior to C2-3. Additional C3-4 ACDF. Additional C4-5 anterior cervical hardware, incompletely  visualized. IMPRESSION: Surgical probe posterior to C2-3, as above. Electronically Signed   By: Julian Hy M.D.   On: 07/30/2015 19:09    ASSESSMENT & PLAN:   Ms. Tarbox is a pleasant 65 year old female with  #1 Severe microcytic hypochromic anemia  Due to iron deficiency. Patient's hemoglobin has responded very well to IV iron with improvement in her hemoglobin from 7.2 to 12.4  #2 severe iron deficiency likely due to chronic GI bleeding + poor oral iron absorption. She has a EGD and colonoscopy in 2013.  Previous history of gastric ulcer requiring blood transfusions.  Also send gastric polyps and a large hernia and prone to developing Cameron ulcers, previous colonic polyps and previous bleeding hemorrhoids.  Patient reports having recent EGD and colonoscopy with [name] in April 2017 which showed a large gastric ulcer. She reports that it was noted to be benign and Helicobacter pylori negative. The reports of the studies and the pathology are not available to [place] at this time.  #3 history of poor tolerance to oral iron with significant GI distress.  She is also on chronic PPI therapy that puts her at higher risk of developing iron deficiency due to poor absorption.  And also B12 deficiency and possible vitamin D deficiency. Plan  -Patient notes that she is feeling much better after the IV iron and tolerated it without any problems. -Her ferritin level is 46 and she feels that she is starting to have fatigue which she did prior to having received IV iron previously. -She is also likely to have poor oral iron absorption at this time and did not tolerate oral iron. -Informed consent was obtained to give her 2 additional weekly doses of IV Feraheme 510 mg to try to maintain her ferritin level more than 100 in the setting of chronic inflammation and symptoms. -Continue follow-up with GI to ensure resolution of gastric ulcer. -We shall try to get the reports of her EGD and colonoscopy  and pathology reports.    Return to care with Dr.Derl Abalos in 12 weeks with repeat CBC, CMP and iron labs. And to follow primary care physician for management of other chronic medical issues and with Dr.Deveshwar for mx of her rheumatologic issues.  All of the patients questions were answered with apparent satisfaction. The patient knows to call the clinic with any problems, questions or concerns.  I spent 25 minutes counseling the patient face to face. The total time spent in the appointment was 25 minutes and more than 50% was on counseling and direct patient cares.    Sullivan Lone MD [place] AAHIVMS [facility] Hematology/Oncology Physician [facility]  (Office):       [phone] (Work cell):  [phone] (Fax):           [phone]  08/14/2015 11:06 AM

## 2015-08-14 NOTE — Telephone Encounter (Signed)
per pof to sch pt appt-gave pt copy of avs-sch trmt

## 2015-08-22 ENCOUNTER — Ambulatory Visit (HOSPITAL_BASED_OUTPATIENT_CLINIC_OR_DEPARTMENT_OTHER): Payer: BLUE CROSS/BLUE SHIELD

## 2015-08-22 VITALS — BP 119/73 | HR 78 | Temp 98.2°F | Resp 18

## 2015-08-22 DIAGNOSIS — D509 Iron deficiency anemia, unspecified: Secondary | ICD-10-CM | POA: Diagnosis not present

## 2015-08-22 DIAGNOSIS — E611 Iron deficiency: Secondary | ICD-10-CM

## 2015-08-22 MED ORDER — SODIUM CHLORIDE 0.9 % IV SOLN
Freq: Once | INTRAVENOUS | Status: AC
  Administered 2015-08-22: 15:00:00 via INTRAVENOUS

## 2015-08-22 MED ORDER — FERUMOXYTOL INJECTION 510 MG/17 ML
510.0000 mg | Freq: Once | INTRAVENOUS | Status: AC
  Administered 2015-08-22: 510 mg via INTRAVENOUS
  Filled 2015-08-22: qty 17

## 2015-08-22 NOTE — Patient Instructions (Signed)

## 2015-08-27 ENCOUNTER — Encounter: Payer: Self-pay | Admitting: Internal Medicine

## 2015-08-29 ENCOUNTER — Ambulatory Visit (HOSPITAL_BASED_OUTPATIENT_CLINIC_OR_DEPARTMENT_OTHER): Payer: BLUE CROSS/BLUE SHIELD

## 2015-08-29 VITALS — BP 122/47 | HR 81 | Temp 98.1°F | Resp 18

## 2015-08-29 DIAGNOSIS — D509 Iron deficiency anemia, unspecified: Secondary | ICD-10-CM

## 2015-08-29 DIAGNOSIS — E611 Iron deficiency: Secondary | ICD-10-CM

## 2015-08-29 MED ORDER — SODIUM CHLORIDE 0.9 % IV SOLN
Freq: Once | INTRAVENOUS | Status: AC
  Administered 2015-08-29: 15:00:00 via INTRAVENOUS

## 2015-08-29 MED ORDER — SODIUM CHLORIDE 0.9 % IV SOLN
510.0000 mg | Freq: Once | INTRAVENOUS | Status: AC
  Administered 2015-08-29: 510 mg via INTRAVENOUS
  Filled 2015-08-29: qty 17

## 2015-08-29 NOTE — Patient Instructions (Signed)

## 2015-09-23 ENCOUNTER — Encounter: Payer: Self-pay | Admitting: Podiatry

## 2015-09-23 ENCOUNTER — Ambulatory Visit (INDEPENDENT_AMBULATORY_CARE_PROVIDER_SITE_OTHER): Payer: BLUE CROSS/BLUE SHIELD | Admitting: Podiatry

## 2015-09-23 ENCOUNTER — Ambulatory Visit (INDEPENDENT_AMBULATORY_CARE_PROVIDER_SITE_OTHER): Payer: BLUE CROSS/BLUE SHIELD

## 2015-09-23 DIAGNOSIS — R52 Pain, unspecified: Secondary | ICD-10-CM | POA: Diagnosis not present

## 2015-09-23 DIAGNOSIS — M7731 Calcaneal spur, right foot: Secondary | ICD-10-CM | POA: Diagnosis not present

## 2015-09-23 DIAGNOSIS — L600 Ingrowing nail: Secondary | ICD-10-CM | POA: Diagnosis not present

## 2015-09-23 MED ORDER — DOXYCYCLINE HYCLATE 100 MG PO TABS: 100.0000 mg | ORAL_TABLET | Freq: Two times a day (BID) | ORAL | 0 refills | Status: DC

## 2015-09-23 NOTE — Patient Instructions (Signed)

## 2015-09-29 DIAGNOSIS — L600 Ingrowing nail: Secondary | ICD-10-CM | POA: Insufficient documentation

## 2015-09-29 DIAGNOSIS — M7730 Calcaneal spur, unspecified foot: Secondary | ICD-10-CM | POA: Insufficient documentation

## 2015-09-29 NOTE — Progress Notes (Signed)
Subjective: 65 year old female presents the office today for concerns of her left big toenail become ingrown and painful. She states they get swollen and red at times. Denies any drainage or pus pieces any recent treatment. She also states she has pain in the back of her right heel has been ongoing for several years and she previously had surgery to this area. She's had no recent treatment. She states that she had a bone spur resected however did come back is painful with shoe gear and pressure as it rubs. This been ongoing for quite sometime. No other complaints at this time.  Objective: AAO x3, NAD DP/PT pulses palpable bilaterally, CRT less than 3 seconds Protective sensation intact with Simms Weinstein monofilament, vibratory sensation intact, Achilles tendon reflex intact Principal incurvation both the medial and lateral nail borders left hallux toenail with tenderness palpation of the nail borders. There is localized edema without any significant erythema or ascending cellulitis. There is no drainage pus. There is tenderness with a long the nail border. No other areas of tenderness of the other nails. Mild tender to palpation on the posterior aspect of the right heel area prominent heel spur. There is no edema, erythema. Achilles tendon is intact there is no pain along the Achilles tendon. Equinus is present. No other areas of tenderness bilateral. No areas of pinpoint bony tenderness or pain with vibratory sensation. MMT 5/5, ROM WNL. No edema, erythema, increase in warmth to bilateral lower extremities.  No open lesions or pre-ulcerative lesions.  No pain with calf compression, swelling, warmth, erythema  Assessment: Left ingrown toenail, right retrocalcaneal exostosis  Plan: -All treatment options discussed with the patient including all alternatives, risks, complications.  -Etiology of symptoms were discussed -At this time, the patient is requesting partial nail removal with chemical  matricectomy to the symptomatic portion of the nail. Risks and complications were discussed with the patient for which they understand and  verbally consent to the procedure. Under sterile conditions a total of 3 mL of a mixture of 2% lidocaine plain and 0.5% Marcaine plain was infiltrated in a hallux block fashion. Once anesthetized, the skin was prepped in sterile fashion. A tourniquet was then applied. Next the medial and lateral aspect of hallux nail border was then sharply excised making sure to remove the entire offending nail border. Once the nails were ensured to be removed area was debrided and the underlying skin was intact. There is no purulence identified in the procedure. Next phenol was then applied under standard conditions and copiously irrigated. Silvadene was applied. A dry sterile dressing was applied. After application of the dressing the tourniquet was removed and there is found to be an immediate capillary refill time to the digit. The patient tolerated the procedure well any complications. Post procedure instructions were discussed the patient for which he verbally understood. Follow-up in one week for nail check or sooner if any problems are to arise. Discussed signs/symptoms of infection and directed to call the office immediately should any occur or go directly to the emergency room. In the meantime, encouraged to call the office with any questions, concerns, changes symptoms. -Regards to the painful retrocalcaneal exostosis dispensed offloading pads. Discussed shoe gear modifications as well as stretching exercises. Pain is minimal today's we'll hold off on any, steroid injection -Patient encouraged to call the office with any questions, concerns, change in symptoms.   Celesta Gentile, DPM

## 2015-10-04 ENCOUNTER — Encounter: Payer: Self-pay | Admitting: Podiatry

## 2015-10-04 ENCOUNTER — Ambulatory Visit (INDEPENDENT_AMBULATORY_CARE_PROVIDER_SITE_OTHER): Payer: BLUE CROSS/BLUE SHIELD | Admitting: Podiatry

## 2015-10-04 DIAGNOSIS — Z9889 Other specified postprocedural states: Secondary | ICD-10-CM

## 2015-10-04 DIAGNOSIS — L600 Ingrowing nail: Secondary | ICD-10-CM

## 2015-10-11 NOTE — Progress Notes (Signed)
Subjective: Jade Cardenas is a 65 y.o.  female returns to office today for follow up evaluation after having left Hallux partial nail avulsion performed. Patient has been soaking using epsom salts and applying topical antibiotic covered with bandaid daily Denies any pain or drainage.. Patient denies fevers, chills, nausea, vomiting. Denies any calf pain, chest pain, SOB.   Objective:  Vitals: Reviewed  General: Well developed, nourished, in no acute distress, alert and oriented x3   Dermatology: Skin is warm, dry and supple bilateral. Left hallux nail borders appears to be clean, dry, with mild granular tissue and surrounding scab. There is no surrounding erythema, edema, drainage/purulence. The remaining nails appear unremarkable at this time. There are no other lesions or other signs of infection present.  Neurovascular status: Intact. No lower extremity swelling; No pain with calf compression bilateral.  Musculoskeletal: No tenderness to palpation of the left hallux nail foldd. Muscular strength within normal limits bilateral.   Assesement and Plan: S/p partial nail avulsion, doing well.   -Continue soaking in epsom salts twice a day followed by antibiotic ointment and a band-aid. Can leave uncovered at night. Continue this until completely healed.  -If the area has not healed in 2 weeks, call the office for follow-up appointment, or sooner if any problems arise.  -Again discussed the treatment options for the right posterior heel spur. Discussed possible surgical intervention in the future.  -Monitor for any signs/symptoms of infection. Call the office immediately if any occur or go directly to the emergency room. Call with any questions/concerns.  Celesta Gentile, DPM

## 2015-10-25 ENCOUNTER — Ambulatory Visit (INDEPENDENT_AMBULATORY_CARE_PROVIDER_SITE_OTHER): Payer: BLUE CROSS/BLUE SHIELD | Admitting: Podiatry

## 2015-10-25 ENCOUNTER — Encounter: Payer: Self-pay | Admitting: Podiatry

## 2015-10-25 DIAGNOSIS — Z9889 Other specified postprocedural states: Secondary | ICD-10-CM

## 2015-10-25 DIAGNOSIS — M7731 Calcaneal spur, right foot: Secondary | ICD-10-CM | POA: Diagnosis not present

## 2015-10-25 DIAGNOSIS — L600 Ingrowing nail: Secondary | ICD-10-CM

## 2015-10-27 NOTE — Progress Notes (Signed)
Subjective: 65 year old female presents the office they for follow-up evaluation status post left partial nail avulsions left big toe. She states that the area is improving but still gets sore times the remainder the toenail. Denies any drainage or pus any redness. Denies any red streaks. She still gets pain in the back of her right heel and she with a steroid injection of the future but would like to hold off today due to personal issues. Denies any systemic complaints such as fevers, chills, nausea, vomiting. No acute changes since last appointment, and no other complaints at this time.   Objective: AAO x3, NAD DP/PT pulses palpable bilaterally, CRT less than 3 seconds Status post partial nail avulsion left medial lateral nail border. There is greenish in tissues scab present to the area without any edema, erythema, ascending synovitis. There is no fluctuance or crepitus. There is no malodor. There is tenderness to the distal portion remainder the toenail however no other areas of tenderness. No clinical signs of infection. Large prominent retrocalcaneal exostosis present on the right heel. Mild tenderness to palpation diffusely to this area without any edema, erythema. No pain on the Achilles tendon. No other areas of tenderness bilaterally. No edema, erythema, increase in warmth to bilateral lower extremities.  No open lesions or pre-ulcerative lesions.  No pain with calf compression, swelling, warmth, erythema  Assessment: Healing status post tarsal nail avulsion left hallux; retrocalcaneal exostosis/bursitis  Plan: -All treatment options discussed with the patient including all alternatives, risks, complications.  -Remainder the toenails the left hallux toenail is debrided today without complications or bleeding. Recommended continued antibiotic ointment and the day but keep uncovered at night. Continue Epson salt soaks. Monitor for infection. Not healed in 2 weeks to call the  office. -Discussed both conservative and surgical treatment options for the heel spur to the right foot. Discussed there were injection but she wishes to hold off today. Systems continue follow-up at her discretion. -Patient encouraged to call the office with any questions, concerns, change in symptoms.   Jade Cardenas, DPM

## 2015-10-29 ENCOUNTER — Encounter: Payer: Self-pay | Admitting: Internal Medicine

## 2015-11-05 NOTE — Telephone Encounter (Signed)
Ok to not do labs ahead of time and when at next visit can decide if any other labs needed.  Please inform patient

## 2015-11-08 ENCOUNTER — Other Ambulatory Visit (HOSPITAL_BASED_OUTPATIENT_CLINIC_OR_DEPARTMENT_OTHER): Payer: BLUE CROSS/BLUE SHIELD

## 2015-11-08 DIAGNOSIS — D500 Iron deficiency anemia secondary to blood loss (chronic): Secondary | ICD-10-CM

## 2015-11-08 DIAGNOSIS — D509 Iron deficiency anemia, unspecified: Secondary | ICD-10-CM | POA: Diagnosis not present

## 2015-11-08 LAB — CBC & DIFF AND RETIC
BASO%: 0.3 % (ref 0.0–2.0)
Basophils Absolute: 0 10*3/uL (ref 0.0–0.1)
EOS%: 0.9 % (ref 0.0–7.0)
Eosinophils Absolute: 0.1 10*3/uL (ref 0.0–0.5)
HCT: 37.2 % (ref 34.8–46.6)
HGB: 13.1 g/dL (ref 11.6–15.9)
Immature Retic Fract: 6.7 % (ref 1.60–10.00)
LYMPH%: 26.6 % (ref 14.0–49.7)
MCH: 31.4 pg (ref 25.1–34.0)
MCHC: 35.2 g/dL (ref 31.5–36.0)
MCV: 89.2 fL (ref 79.5–101.0)
MONO#: 0.4 10*3/uL (ref 0.1–0.9)
MONO%: 5.9 % (ref 0.0–14.0)
NEUT#: 4.2 10*3/uL (ref 1.5–6.5)
NEUT%: 66.3 % (ref 38.4–76.8)
Platelets: 262 10*3/uL (ref 145–400)
RBC: 4.17 10*6/uL (ref 3.70–5.45)
RDW: 13.2 % (ref 11.2–14.5)
Retic %: 2.38 % — ABNORMAL HIGH (ref 0.70–2.10)
Retic Ct Abs: 99.25 10*3/uL — ABNORMAL HIGH (ref 33.70–90.70)
WBC: 6.4 10*3/uL (ref 3.9–10.3)
lymph#: 1.7 10*3/uL (ref 0.9–3.3)

## 2015-11-08 LAB — COMPREHENSIVE METABOLIC PANEL
ALT: 25 U/L (ref 0–55)
AST: 21 U/L (ref 5–34)
Albumin: 3.7 g/dL (ref 3.5–5.0)
Alkaline Phosphatase: 103 U/L (ref 40–150)
Anion Gap: 10 mEq/L (ref 3–11)
BUN: 18.5 mg/dL (ref 7.0–26.0)
CO2: 24 mEq/L (ref 22–29)
Calcium: 9.4 mg/dL (ref 8.4–10.4)
Chloride: 106 mEq/L (ref 98–109)
Creatinine: 0.8 mg/dL (ref 0.6–1.1)
EGFR: 81 mL/min/{1.73_m2} — ABNORMAL LOW (ref >90)
Glucose: 115 mg/dl (ref 70–140)
Potassium: 3.7 mEq/L (ref 3.5–5.1)
Sodium: 140 mEq/L (ref 136–145)
Total Bilirubin: 0.5 mg/dL (ref 0.20–1.20)
Total Protein: 7.6 g/dL (ref 6.4–8.3)

## 2015-11-11 ENCOUNTER — Ambulatory Visit (INDEPENDENT_AMBULATORY_CARE_PROVIDER_SITE_OTHER): Payer: BLUE CROSS/BLUE SHIELD | Admitting: Podiatry

## 2015-11-11 ENCOUNTER — Encounter: Payer: Self-pay | Admitting: Podiatry

## 2015-11-11 VITALS — BP 144/78 | HR 87 | Ht 64.0 in | Wt 210.0 lb

## 2015-11-11 DIAGNOSIS — L600 Ingrowing nail: Secondary | ICD-10-CM

## 2015-11-11 DIAGNOSIS — M7731 Calcaneal spur, right foot: Secondary | ICD-10-CM | POA: Diagnosis not present

## 2015-11-11 LAB — FERRITIN: Ferritin: 268 ng/ml (ref 9–269)

## 2015-11-12 ENCOUNTER — Other Ambulatory Visit: Payer: BLUE CROSS/BLUE SHIELD

## 2015-11-12 NOTE — Progress Notes (Signed)
Subjective: 65 year old female presents the office they for follow-up evaluation status post left partial nail avulsions left big toe. She states that she is doing well she believes the areas heal. She said no pain or drainage. She is continuing in the back of her right heel along the area of the large posterior heel spur. She has been using the anti-inflammatory cream at times and should to help some but is not very likely relief. She has not been stretching and she elected to discuss a steroid injection. Denies any systemic complaints such as fevers, chills, nausea, vomiting. No acute changes since last appointment, and no other complaints at this time.   Objective: AAO x3, NAD DP/PT pulses palpable bilaterally, CRT less than 3 seconds Status post partial nail avulsion left medial lateral nail border. The procedure site is healed scab is formed over the area. There is no ascending erythema or ascending saline solution of fluctuance or crepitus is no malodor. No drainage or pus. No conical signs of infection. On the posterior aspect of the right heels large prominent retrocalcaneal exostosis with mild tenderness to palpation mostly on the lateral portion. There is no overlying edema, erythema, increase in warts. Equinus is present. No pain along the Achilles tendon. No pain lateral compression.  No open lesions or pre-ulcerative lesions.  No pain with calf compression, swelling, warmth, erythema  Assessment: Healing status post tarsal nail avulsion left hallux; retrocalcaneal exostosis/bursitis  Plan: -All treatment options discussed with the patient including all alternatives, risks, complications.  -Procedure site appears to be healed. Monitor for any recurrence. -Posterior heel spur discussed stretching, icing as well as offloading pad. The offloading gel cushion was dispensed to her today. Discussed steroid injection and after assuring to be immobilized in a cam boot however she does not want  to do that she will hold off on that for now. Discussed with her that in the future surgical intervention or if it becomes swollen or inflamed as possible steroid injection. She will continue with stretching, icing as well as changing shoes and pad for now. -Follow-up as needed. Call any questions concerns.  Celesta Gentile, DPM

## 2015-11-13 ENCOUNTER — Other Ambulatory Visit: Payer: Self-pay | Admitting: *Deleted

## 2015-11-13 DIAGNOSIS — E611 Iron deficiency: Secondary | ICD-10-CM

## 2015-11-14 ENCOUNTER — Ambulatory Visit: Payer: BLUE CROSS/BLUE SHIELD

## 2015-11-14 ENCOUNTER — Ambulatory Visit (HOSPITAL_BASED_OUTPATIENT_CLINIC_OR_DEPARTMENT_OTHER): Payer: BLUE CROSS/BLUE SHIELD | Admitting: Hematology

## 2015-11-14 ENCOUNTER — Encounter: Payer: Self-pay | Admitting: Hematology

## 2015-11-14 VITALS — BP 142/69 | HR 81 | Temp 97.9°F | Resp 16 | Wt 217.0 lb

## 2015-11-14 DIAGNOSIS — D509 Iron deficiency anemia, unspecified: Secondary | ICD-10-CM | POA: Diagnosis not present

## 2015-11-14 DIAGNOSIS — D500 Iron deficiency anemia secondary to blood loss (chronic): Secondary | ICD-10-CM

## 2015-11-14 DIAGNOSIS — E538 Deficiency of other specified B group vitamins: Secondary | ICD-10-CM | POA: Diagnosis not present

## 2015-11-14 DIAGNOSIS — E611 Iron deficiency: Secondary | ICD-10-CM

## 2015-11-17 NOTE — Progress Notes (Signed)
Jade Cardenas    HEMATOLOGY/ONCOLOGY CLINIC NOTE  Date of Service: .11/14/2015  Patient Care Team: [name], MD as PCP - General Richmond Campbell, MD (Gastroenterology) Megan Salon, MD (Gynecology) Josue Hector, MD (Cardiology) Bo Merino, MD as Consulting Physician (Rheumatology) Brunetta Genera, MD as Consulting Physician (Hematology)  CHIEF COMPLAINTS/PURPOSE OF CONSULTATION: f/u for Iron deficiency anemia  Diagnosis severe iron deficiency anemia + ACD (due to psoariasis) Oral iron intolerance  Treatment IV Iron as needed (received IV feraheme 08/29/15, 08/22/2015, 06/04/2015, 05/28/2015)   INTERVAL HISTORY  Jade Cardenas is here for her scheduled follow-up for iron deficiency anemia. She notes her energy levels today are good. Her hemoglobin levels have normalized at 13.1 with an MCV of 89. Ferritin levels are within normal limits today at 268. No other acute new symptoms. She feels her simponi is not being very effective for her psoariatic arthritis  and she'll be following up with her rheumatologist.   She has been very busy with her injury at work. Has adopted a new dog and now has 2. Reports no overt GI bleeding or melena. No abdominal pain.   MEDICAL HISTORY:  Past Medical History:  Diagnosis Date  . Abnormal Pap smear    H/O  . Anemia    iron def  . Arthritis    rheumatoid and osteo now flt to be psoriateic   . Arthritis    psoreratic, osteoarthritis  . Broken toe   . Bruises easily   . CHOLECYSTECTOMY, HX OF   . Chronic back pain   . COLONIC POLYPS, HX OF 02/09/2007  . Cough, persistent 01/21/2012   poss from acei   . 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 Fibromyalgia    doesn't require meds  . GASTRIC ULCER, ACUTE, HEMORRHAGE, HX OF 02/09/2007  . GERD (gastroesophageal reflux disease)    takes Nexium daily  . Goiter   . H/O hiatal hernia   . Headache(784.0)    occasionally  . History of bronchitis   . Hx of seasonal allergies    takes Claritin  daily  . HYPERLIPIDEMIA 12/29/2006   takes Lovastatin nightly  . HYPERTENSION 12/29/2006   takes Enalapril daily  . IBS 04/28/2007  . Insomnia    related to pain;takes Flexeril and Tylenol PM nightly  . Joint pain   . Joint swelling   . MENOPAUSAL DISORDER 12/07/2007  . Palpitations 02/09/2007  . PONV (postoperative nausea and vomiting)    NAUSEA  . REDUCTION MAMMOPLASTY, HX OF 02/09/2007  . Rheumatoid arthritis(714.0) 12/29/2006  . Right bundle branch block 02/09/2007  . S/P lumbar fusion 6 13 09/03/2011   L4 L5  posterior   . SYNCOPE 12/07/2007  . UNS ADVRS EFF OTH RX MEDICINAL\T\BIOLOGICAL SBSTNC 02/09/2007  . UTI (lower urinary tract infection)    hx of;saw urologist last yr and he told her she was over reacting and not a big deal   . WEIGHT GAIN 12/18/2008    SURGICAL HISTORY: Past Surgical History:  Procedure Laterality Date  . ABDOMINAL HYSTERECTOMY  1986   TAH/BSO  . BACK SURGERY  12+yrs ago   Synovial Cyst removal  . BACK SURGERY  6/13   lumbar L4-L5 fusion, lamenectomy  . blood transfused  6/13   transfused 2 units post op from back surgery  . bone spur  6-23yrs ago   right ankle  . BREAST REDUCTION SURGERY     last 80's  . BUNIONECTOMY  10/03/14   right foot  .  CERVICAL FUSION     2 12   . CHOLECYSTECTOMY  1996  . COLONOSCOPY    . ESOPHAGOGASTRODUODENOSCOPY ENDOSCOPY    . POSTERIOR CERVICAL LAMINECTOMY Left 07/30/2015   Procedure: Laminectomy and Foraminotomy - left - Cervical two -Cervical three;  Surgeon: Earnie Larsson, MD;  Location: [place] [facility];  Service: Neurosurgery;  Laterality: Left;  . TONSILLECTOMY     as a child    SOCIAL HISTORY: Social History   Social History  . Marital status: Widowed    Spouse name: N/A  . Number of children: N/A  . Years of education: N/A   Occupational History  . Not on file.   Social History Main Topics  . Smoking status: Never Smoker  . Smokeless tobacco: Never Used  . Alcohol use No  . Drug use: No  . Sexual  activity: No     Comment: TAH/BSO   Other Topics Concern  . 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 Diabetes Mother   . Colon cancer Mother     x2  . Arthritis Mother   . Hypertension Mother   . Endometrial cancer Mother   . Stroke Father   . Heart disease Father   . Colon cancer Father   . Arthritis Brother   . Hypertension Brother   . Hyperlipidemia Brother   . Other      DISH brother  . Hypertension Maternal Grandmother   . Stroke Maternal Grandmother   . Infertility Brother   . Anesthesia problems Neg Hx   . Hypotension Neg Hx   . Malignant hyperthermia Neg Hx   . Pseudochol deficiency Neg Hx     ALLERGIES:  is allergic to erythromycin; iodinated diagnostic agents; ace inhibitors; dilaudid [hydromorphone hcl]; morphine and related; keflex [cephalexin]; and macrobid [nitrofurantoin].  MEDICATIONS:  Current Outpatient Prescriptions  Medication Sig Dispense Refill  . acetaminophen (TYLENOL) 650 MG CR tablet Take 650 mg by mouth as needed for pain.    Jade Cardenas amLODipine (NORVASC) 5 MG tablet Take 1 tablet (5 mg total) by mouth daily. 90 tablet 1  . Cholecalciferol (VITAMIN D) 2000 units tablet Take 2,000 Units by mouth daily.    . Diphenhydramine-APAP, sleep, (TYLENOL PM EXTRA STRENGTH PO) Take 2 tablets by mouth at bedtime as needed. For sleep    . esomeprazole (NEXIUM) 40 MG capsule Take 1 capsule (40 mg total) by mouth daily before breakfast. (Patient taking differently: Take 80 mg by mouth daily before breakfast. ) 30 capsule 11  . losartan (COZAAR) 100 MG tablet Take 1 tablet (100 mg total) by mouth daily. 90 tablet 1  . lovastatin (MEVACOR) 20 MG tablet Take 1 tablet (20 mg total) by mouth at bedtime. 90 tablet 3  . Multiple Vitamin (MULTIVITAMIN) tablet Take 1  tablet by mouth daily.      Jade Cardenas SIMPONI 50 MG/0.5ML SOAJ Inject into the skin every 30 (thirty) days.   1   No current facility-administered medications for this visit.     REVIEW OF SYSTEMS:    10 Point review of Systems was done is negative except as noted above.  PHYSICAL EXAMINATION: ECOG PERFORMANCE STATUS: 2 - Symptomatic, <50% confined to bed  . Vitals:   11/14/15 1327  BP: Jade Cardenas)  142/69  Pulse: 81  Resp: 16  Temp: 97.9 F (36.6 C)   Filed Weights   11/14/15 1327  Weight: 217 lb (98.4 kg)   .Body mass index is 37.25 kg/m.  GENERAL:alert, in no acute distress and comfortabl SKIN: skin color, texture, turgor are normal, no rashes or significant lesions EYES: mild conjunctival pallor no scleral icterus OROPHARYNX:no exudate, no erythema and lips, buccal mucosa, and tongue normal  NECK: supple, no JVD, thyroid normal size, non-tender, without nodularity LYMPH:  no palpable lymphadenopathy in the cervical, axillary or inguinal LUNGS: clear to auscultation with normal respiratory effort HEART: regular rate & rhythm,  no murmurs and no lower extremity edema ABDOMEN: abdomen soft, non-tender, normoactive bowel sounds  Musculoskeletal: no cyanosis of digits and no clubbing  PSYCH: alert & oriented x 3 with fluent speech NEURO: no focal motor/sensory deficits  LABORATORY DATA:  I have reviewed the data as listed  . CBC Latest Ref Rng & Units 11/08/2015 08/07/2015 07/18/2015  WBC 3.9 - 10.3 10e3/uL 6.4 6.1 5.5  Hemoglobin 11.6 - 15.9 g/dL 13.1 12.4 12.4  Hematocrit 34.8 - 46.6 % 37.2 36.1 37.6  Platelets 145 - 400 10e3/uL 262 337 321   . CBC    Component Value Date/Time   WBC 6.4 11/08/2015 1503   WBC 5.5 07/18/2015 1052   RBC 4.17 11/08/2015 1503   RBC 4.55 07/18/2015 1052   HGB 13.1 11/08/2015 1503   HCT 37.2 11/08/2015 1503   PLT 262 11/08/2015 1503   MCV 89.2 11/08/2015 1503   MCH 31.4 11/08/2015 1503   MCH 27.3 07/18/2015 1052   MCHC 35.2 11/08/2015 1503    MCHC 33.0 07/18/2015 1052   RDW 13.2 11/08/2015 1503   LYMPHSABS 1.7 11/08/2015 1503   MONOABS 0.4 11/08/2015 1503   EOSABS 0.1 11/08/2015 1503   BASOSABS 0.0 11/08/2015 1503    . Lab Results  Component Value Date   RETICCTPCT 2.38 (H) 11/08/2015   RBC 4.17 11/08/2015   RETICCTABS 99.25 (H) 11/08/2015    . CMP Latest Ref Rng & Units 11/08/2015 08/07/2015 07/18/2015  Glucose 70 - 140 mg/dl 115 94 108(H)  BUN 7.0 - 26.0 mg/dL 18.5 20.0 14  Creatinine 0.6 - 1.1 mg/dL 0.8 0.9 0.74  Sodium 136 - 145 mEq/L 140 140 141  Potassium 3.5 - 5.1 mEq/L 3.7 4.4 4.5  Chloride 101 - 111 mmol/L - - 105  CO2 22 - 29 mEq/L 24 26 26   Calcium 8.4 - 10.4 mg/dL 9.4 10.0 9.6  Total Protein 6.4 - 8.3 g/dL 7.6 8.1 -  Total Bilirubin 0.20 - 1.20 mg/dL 0.50 0.44 -  Alkaline Phos 40 - 150 U/L 103 99 -  AST 5 - 34 U/L 21 14 -  ALT 0 - 55 U/L 25 13 -   . Lab Results  Component Value Date   IRON 53 08/07/2015   TIBC 310 08/07/2015   IRONPCTSAT 17 (L) 08/07/2015   (Iron and TIBC)  Lab Results  Component Value Date   FERRITIN 268 11/08/2015    RADIOGRAPHIC STUDIES: I have personally reviewed the radiological images as listed and agreed with the findings in the report. No results found.   EGD and colonoscopy with Dr. [name] in April 2017 which showed a large gastric ulcer with no overt bleeding. She reports that she was told it was benign and negative for Helicobacter pylori. She also had a colonoscopy that showed no significant polyps or evidence of bleeding.    ASSESSMENT & PLAN:  Jade Cardenas is a pleasant 65 year old female with  #1 Severe microcytic hypochromic anemia  Due to iron deficiency. Patient's hemoglobin has responded very well to IV iron with improvement in her hemoglobin from 7.2 to 12.4 and now  13.1 with a normal MCV . Ferritin levels are now normal at 268   #2 severe iron deficiency likely due to chronic GI bleeding + poor oral iron absorption. She has a EGD and colonoscopy  in 2013.  Previous history of gastric ulcer requiring blood transfusions.  Also send gastric polyps and a large hernia and prone to developing Cameron ulcers, previous colonic polyps and previous bleeding hemorrhoids.  Patient reports having recent EGD and colonoscopy with [name] in April 2017 which showed a large gastric ulcer. She reports that it was noted to be benign and Helicobacter pylori negative. The reports of the studies and the pathology are not available to [place] at this time.  #3 history of poor tolerance to oral iron with significant GI distress.  She is also on chronic PPI therapy that puts her at higher risk of developing iron deficiency due to poor absorption.  And also B12 deficiency and possible vitamin D deficiency. Plan  -Patient notes good energy levels right now and is very busy at work. -She will continue to follow up with Dr. [name] Fabienne Bruns to ensure resolution of gastric ulcer. - continue follow-up with rheumatology for management of her psoriatic arthritis. -No indication for additional IV iron at this time -Reasonable to continue taking her multivitamin tablet.   Return to care with Dr.Grover Woodfield in  6 months with repeat CBC, CMP and iron labs. And to follow primary care physician for management of other chronic medical issues and with Dr.Deveshwar for mx of her rheumatologic issues.  All of the patients questions were answered with apparent satisfaction. The patient knows to call the clinic with any problems, questions or concerns.  I spent 20 minutes counseling the patient face to face. The total time spent in the appointment was 20 minutes and more than 50% was on counseling and direct patient cares.    Sullivan Lone MD [place] AAHIVMS [facility] Hematology/Oncology Physician [facility]  (Office):       [phone] (Work cell):  [phone] (Fax):           [phone]  11/17/2015 2:57 PM

## 2015-11-19 ENCOUNTER — Other Ambulatory Visit: Payer: BLUE CROSS/BLUE SHIELD

## 2015-11-25 NOTE — Progress Notes (Signed)
Pre visit review using our clinic review tool, if applicable. No additional management support is needed unless otherwise documented below in the visit note.  Chief Complaint  Patient presents with  . Annual Exam    HPI: Patient  Jade Cardenas  65 y.o. comes in today for Preventive Health Care visit  Another medical evaluations and now stable. Include neck surgery EGDs for gastric ulcers IV infusion for iron deficiency anemia and is still on some honey for her psoriatic arthritis. She is now stopped working mostly still some volunteer work. Her energy level is better since her hemoglobin is in the normal range. She is up-to-date on her well woman exam. She is to follow-up with hematology in about 6 months. Last full set of labs was done earlier this month except for lipid panel. Her blood pressure is usually controlled in the 120 range.  Health Maintenance  Topic Date Due  . INFLUENZA VACCINE  10/01/2015  . HIV Screening  11/24/2016 (Originally 05/27/1965)  . MAMMOGRAM  06/26/2017  . PAP SMEAR  11/15/2017  . TETANUS/TDAP  05/10/2018  . COLONOSCOPY  09/30/2021  . ZOSTAVAX  Completed  . Hepatitis C Screening  Completed   Health Maintenance Review LIFESTYLE:  Exercise:  Walks her 2 older dogs  Tobacco/ETS: no Alcohol: n Sugar beverages:n Sleep:interrupted  Drug use: no HH of   2 dops 7 and 12  Work: retired still volunteers  estopenia on dexa last  On vit d supp    ROS:   Left heel spur may need surgery and  Wrist pain right no falling skin changes around nails  GEN/ HEENT: No fever, significant weight changes sweats headaches vision problems hearing changes, CV/ PULM; No chest pain shortness of breath cough, syncope,edema  change in exercise tolerance. GI /GU: No adominal pain, vomiting, change in bowel habits. No blood in the stool. No significant GU symptoms. SKIN/HEME: ,no acute skin rashes suspicious lesions or bleeding. No lymphadenopathy, nodules, masses.  NEURO/  PSYCH:  No neurologic signs such as weakness numbness. No depression anxiety. IMM/ Allergy: No unusual infections.  Allergy .   REST of 12 system review negative except as per HPI   Past Medical History:  Diagnosis Date  . Abnormal Pap smear    H/O  . Anemia    iron def  . Arthritis    rheumatoid and osteo now flt to be psoriateic   . Arthritis    psoreratic, osteoarthritis  . Broken toe   . Bruises easily   . CHOLECYSTECTOMY, HX OF   . Chronic back pain   . COLONIC POLYPS, HX OF 02/09/2007  . Cough, persistent 01/21/2012   poss from acei   . 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 Fibromyalgia    doesn't require meds  . GASTRIC ULCER, ACUTE, HEMORRHAGE, HX OF 02/09/2007  . GERD (gastroesophageal reflux disease)    takes Nexium daily  . Goiter   . H/O hiatal hernia   . Headache(784.0)    occasionally  . History of bronchitis   . Hx of seasonal allergies    takes Claritin daily  . HYPERLIPIDEMIA 12/29/2006   takes Lovastatin nightly  . HYPERTENSION 12/29/2006   takes Enalapril daily  . IBS 04/28/2007  . Insomnia    related to pain;takes Flexeril and Tylenol PM nightly  . Joint pain   . Joint swelling   . MENOPAUSAL DISORDER 12/07/2007  . Palpitations 02/09/2007  . PONV (postoperative nausea and vomiting)    NAUSEA  .  REDUCTION MAMMOPLASTY, HX OF 02/09/2007  . Rheumatoid arthritis(714.0) 12/29/2006  . Right bundle branch block 02/09/2007  . S/P lumbar fusion 6 13 09/03/2011   L4 L5  posterior   . SYNCOPE 12/07/2007  . UNS ADVRS EFF OTH RX MEDICINAL\T\BIOLOGICAL SBSTNC 02/09/2007  . UTI (lower urinary tract infection)    hx of;saw urologist last yr and he told her she was over reacting and not a big deal   . WEIGHT GAIN 12/18/2008    Past Surgical History:  Procedure Laterality Date  . ABDOMINAL HYSTERECTOMY  1986   TAH/BSO  . BACK SURGERY  12+yrs ago   Synovial Cyst removal  . BACK SURGERY  6/13   lumbar L4-L5 fusion, lamenectomy  . blood transfused  6/13    transfused 2 units post op from back surgery  . bone spur  6-20yr ago   right ankle  . BREAST REDUCTION SURGERY     last 80's  . BUNIONECTOMY  10/03/14   right foot  . CERVICAL FUSION     2 14   . CHOLECYSTECTOMY  1996  . COLONOSCOPY    . ESOPHAGOGASTRODUODENOSCOPY ENDOSCOPY    . POSTERIOR CERVICAL LAMINECTOMY Left 07/30/2015   Procedure: Laminectomy and Foraminotomy - left - Cervical two -Cervical three;  Surgeon: HEarnie Larsson MD;  Location: [facility];  Service: Neurosurgery;  Laterality: Left;  . TONSILLECTOMY     as a child    Family History  Problem Relation Age of Onset  . Diabetes Mother   . Colon cancer Mother     x2  . Arthritis Mother   . Hypertension Mother   . Endometrial cancer Mother   . Stroke Father   . Heart disease Father   . Colon cancer Father   . Arthritis Brother   . Hypertension Brother   . Hyperlipidemia Brother   . Other      DISH brother  . Hypertension Maternal Grandmother   . Stroke Maternal Grandmother   . Infertility Brother   . Anesthesia problems Neg Hx   . Hypotension Neg Hx   . Malignant hyperthermia Neg Hx   . Pseudochol deficiency Neg Hx     Social History   Social History  . Marital status: Widowed    Spouse name: N/A  . Number of children: N/A  . Years of education: N/A   Social History Main Topics  . Smoking status: Never Smoker  . Smokeless tobacco: Never Used  . Alcohol use No  . Drug use: No  . Sexual activity: No     Comment: TAH/BSO   Other Topics Concern  . None   Social History Narrative   Works f[employer]ow working less hours on retirement track   Widow  Husband died suddenly 2March 22, 2010  Non smoker    Outpatient Medications Prior to Visit  Medication Sig Dispense Refill  . acetaminophen (TYLENOL) 650 MG CR tablet Take 650 mg by mouth as needed for pain.    .Marland KitchenamLODipine (NORVASC) 5 MG tablet Take 1 tablet (5 mg total) by mouth daily. 90 tablet 1  . Cholecalciferol (VITAMIN D)  2000 units tablet Take 2,000 Units by mouth daily.    . Diphenhydramine-APAP, sleep, (TYLENOL PM EXTRA STRENGTH PO) Take 2 tablets by mouth at bedtime as needed. For sleep    . esomeprazole (NEXIUM) 40 MG capsule Take 1 capsule (40 mg total) by mouth daily before breakfast. (Patient taking differently: Take 80 mg by mouth  daily before breakfast. ) 30 capsule 11  . losartan (COZAAR) 100 MG tablet Take 1 tablet (100 mg total) by mouth daily. 90 tablet 1  . lovastatin (MEVACOR) 20 MG tablet Take 1 tablet (20 mg total) by mouth at bedtime. 90 tablet 3  . Multiple Vitamin (MULTIVITAMIN) tablet Take 1 tablet by mouth daily.      Marland Kitchen SIMPONI 50 MG/0.5ML SOAJ Inject into the skin every 30 (thirty) days.   1   No facility-administered medications prior to visit.      EXAM:  BP 132/70   Temp 97.7 F (36.5 C) (Oral)   Ht 5' 3.75" (1.619 m)   Wt 218 lb 6.4 oz (99.1 kg)   LMP 03/02/1984   BMI 37.78 kg/m   Body mass index is 37.78 kg/m.  Physical Exam: Vital signs reviewed BEM:LJQG is a well-developed well-nourished alert cooperative    who appearsr stated age in no acute distress.  HEENT: normocephalic atraumatic , Eyes: PERRL EOM's full, conjunctiva clear, Nares: paten,t no deformity discharge or tenderness., Ears: no deformity EAC's clear TMs with normal landmarks. Mouth: clear OP, no lesions, edema.  Moist mucous membranes. Dentition in adequate repair. NECK: supple without masses, thyromegaly or bruits. CHEST/PULM:  Clear to auscultation and percussion breath sounds equal no wheeze , rales or rhonchi.  Some kyphosis  CV: PMI is nondisplaced, S1 S2 no gallops, murmurs, rubs. Peripheral pulses are full without delay.No JVD . Breast: normal by inspection . No dimpling, discharge, masses, tenderness or discharge .well healed scar ABDOMEN: Bowel sounds normal nontender  No guard or rebound, no hepato splenomegal no CVA tenderness.  No hernia. Extremtities:  No clubbing cyanosis or edema, no acute  joint swelling or redness no focal atrophyr wrist mild swelling no redness deformity   Left heel hypertophy bone  no redness NEURO:  Oriented x3, cranial nerves 3-12 appear to be intact, no obvious focal weakness,gait within normal limits no abnormal reflexes or asymmetrical SKIN: No acute rashes normal turgor, color, no bruising or petechiae. Cracking around nails but no infection  Has art nails   .  PSYCH: Oriented, good eye contact, no obvious depression anxiety, cognition and judgment appear normal. LN: no cervical axillary inguinal adenopathy  Lab Results  Component Value Date   WBC 6.4 11/08/2015   HGB 13.1 11/08/2015   HCT 37.2 11/08/2015   PLT 262 11/08/2015   GLUCOSE 115 11/08/2015   CHOL 189 11/29/2014   TRIG 145.0 11/29/2014   HDL 57.70 11/29/2014   LDLDIRECT 114.0 04/02/2010   LDLCALC 103 (H) 11/29/2014   ALT 25 11/08/2015   AST 21 11/08/2015   NA 140 11/08/2015   K 3.7 11/08/2015   CL 105 07/18/2015   CREATININE 0.8 11/08/2015   BUN 18.5 11/08/2015   CO2 24 11/08/2015   TSH 1.31 11/29/2014   HGBA1C 6.1 11/29/2014   BP Readings from Last 3 Encounters:  11/26/15 132/70  11/14/15 (!) 142/69  11/11/15 (!) 144/78   Wt Readings from Last 3 Encounters:  11/26/15 218 lb 6.4 oz (99.1 kg)  11/14/15 217 lb (98.4 kg)  11/11/15 210 lb (95.3 kg)    ASSESSMENT AND PLAN:  Discussed the following assessment and plan:  Visit for preventive health examination  Essential hypertension  Psoriatic arthritis ([place])  High risk medication use  History of iron deficiency  Need for prophylactic vaccination and inoculation against influenza - Plan: Flu vaccine HIGH DOSE PF (Fluzone High dose), CANCELED: Flu vaccine HIGH DOSE PF (Fluzone High dose)  Screening-pulmonary TB - Plan: PPD  Immunocompromised patient ([place]) high  risk medication  simponi - high dose flu given today - Plan: Flu vaccine HIGH DOSE PF (Fluzone High dose)  HLD (hyperlipidemia)  Medication  management Lab reviewed done elsewhrer no se of med reported .  Will request Dr Irene Limbo  Or other to get lipid panel at next blood draw to limit frequent venipunture that she has had .   No compelling reason to do blood draw today. stay on same meds .   Patient Care Team: [name], MD as PCP - General Richmond Campbell, MD (Gastroenterology) Megan Salon, MD (Gynecology) Josue Hector, MD (Cardiology) Bo Merino, MD as Consulting Physician (Rheumatology) Brunetta Genera, MD as Consulting Physician (Hematology) Patient Instructions  Consider water exercise also .   bp goal below 140/90  Ok to refill meds for a year when due.  Will send a message to  Heme about adding on lipid panel with next blood tests.   Health Maintenance, Female Adopting a healthy lifestyle and getting preventive care can go a long way to promote health and wellness. Talk with your health care provider about what schedule of regular examinations is right for you. This is a good chance for you to check in with your provider about disease prevention and staying healthy. In between checkups, there are plenty of things you can do on your own. Experts have done a lot of research about which lifestyle changes and preventive measures are most likely to keep you healthy. Ask your health care provider for more information. WEIGHT AND DIET  Eat a healthy diet  Be sure to include plenty of vegetables, fruits, low-fat dairy products, and lean protein.  Do not eat a lot of foods high in solid fats, added sugars, or salt.  Get regular exercise. This is one of the most important things you can do for your health.  Most adults should exercise for at least 150 minutes each week. The exercise should increase your heart rate and make you sweat (moderate-intensity exercise).  Most adults should also do strengthening exercises at least twice a week. This is in addition to the moderate-intensity exercise.  Maintain a  healthy weight  Body mass index (BMI) is a measurement that can be used to identify possible weight problems. It estimates body fat based on height and weight. Your health care provider can help determine your BMI and help you achieve or maintain a healthy weight.  For females 71 years of age and older:   A BMI below 18.5 is considered underweight.  A BMI of 18.5 to 24.9 is normal.  A BMI of 25 to 29.9 is considered overweight.  A BMI of 30 and above is considered obese.  Watch levels of cholesterol and blood lipids  You should start having your blood tested for lipids and cholesterol at 63 years of age, then have this test every 5 years.  You may need to have your cholesterol levels checked more often if:  Your lipid or cholesterol levels are high.  You are older than 63 years of age.  You are at high risk for heart disease.  CANCER SCREENING   Lung Cancer  Lung cancer screening is recommended for adults 74-66 years old who are at high risk for lung cancer because of a history of smoking.  A yearly low-dose CT scan of the lungs is recommended for people who:  Currently smoke.  Have quit within the past 15 years.  Have at least a 30-pack-year history of smoking. A pack year is smoking an average of one pack of cigarettes a day for 1 year.  Yearly screening should continue until it has been 15 years since you quit.  Yearly screening should stop if you develop a health problem that would prevent you from having lung cancer treatment.  Breast Cancer  Practice breast self-awareness. This means understanding how your breasts normally appear and feel.  It also means doing regular breast self-exams. Let your health care provider know about any changes, no matter how small.  If you are in your 20s or 30s, you should have a clinical breast exam (CBE) by a health care provider every 1-3 years as part of a regular health exam.  If you are 46 or older, have a CBE every year.  Also consider having a breast X-ray (mammogram) every year.  If you have a family history of breast cancer, talk to your health care provider about genetic screening.  If you are at high risk for breast cancer, talk to your health care provider about having an MRI and a mammogram every year.  Breast cancer gene (BRCA) assessment is recommended for women who have family members with BRCA-related cancers. BRCA-related cancers include:  Breast.  Ovarian.  Tubal.  Peritoneal cancers.  Results of the assessment will determine the need for genetic counseling and BRCA1 and BRCA2 testing. Cervical Cancer Your health care provider may recommend that you be screened regularly for cancer of the pelvic organs (ovaries, uterus, and vagina). This screening involves a pelvic examination, including checking for microscopic changes to the surface of your cervix (Pap test). You may be encouraged to have this screening done every 3 years, beginning at age 44.  For women ages 27-65, health care providers may recommend pelvic exams and Pap testing every 3 years, or they may recommend the Pap and pelvic exam, combined with testing for human papilloma virus (HPV), every 5 years. Some types of HPV increase your risk of cervical cancer. Testing for HPV may also be done on women of any age with unclear Pap test results.  Other health care providers may not recommend any screening for nonpregnant women who are considered low risk for pelvic cancer and who do not have symptoms. Ask your health care provider if a screening pelvic exam is right for you.  If you have had past treatment for cervical cancer or a condition that could lead to cancer, you need Pap tests and screening for cancer for at least 20 years after your treatment. If Pap tests have been discontinued, your risk factors (such as having a new sexual partner) need to be reassessed to determine if screening should resume. Some women have medical problems that  increase the chance of getting cervical cancer. In these cases, your health care provider may recommend more frequent screening and Pap tests. Colorectal Cancer  This type of cancer can be detected and often prevented.  Routine colorectal cancer screening usually begins at 63 years of age and continues through 63 years of age.  Your health care provider may recommend screening at an earlier age if you have risk factors for colon cancer.  Your health care provider may also recommend using home test kits to check for hidden blood in the stool.  A small camera at the end of a tube can be used to examine your colon directly (sigmoidoscopy or colonoscopy). This is done to check for the earliest forms of colorectal cancer.  Routine screening usually begins at age 58.  Direct examination of the colon should be repeated every 5-10 years through 63 years of age. However, you may need to be screened more often if early forms of precancerous polyps or small growths are found. Skin Cancer  Check your skin from head to toe regularly.  Tell your health care provider about any new moles or changes in moles, especially if there is a change in a mole's shape or color.  Also tell your health care provider if you have a mole that is larger than the size of a pencil eraser.  Always use sunscreen. Apply sunscreen liberally and repeatedly throughout the day.  Protect yourself by wearing long sleeves, pants, a wide-brimmed hat, and sunglasses whenever you are outside. HEART DISEASE, DIABETES, AND HIGH BLOOD PRESSURE   High blood pressure causes heart disease and increases the risk of stroke. High blood pressure is more likely to develop in:  People who have blood pressure in the high end of the normal range (130-139/85-89 mm Hg).  People who are overweight or obese.  People who are African American.  If you are 73-68 years of age, have your blood pressure checked every 3-5 years. If you are 75 years of  age or older, have your blood pressure checked every year. You should have your blood pressure measured twice--once when you are at a hospital or clinic, and once when you are not at a hospital or clinic. Record the average of the two measurements. To check your blood pressure when you are not at a hospital or clinic, you can use:  An automated blood pressure machine at a pharmacy.  A home blood pressure monitor.  If you are between 26 years and 81 years old, ask your health care provider if you should take aspirin to prevent strokes.  Have regular diabetes screenings. This involves taking a blood sample to check your fasting blood sugar level.  If you are at a normal weight and have a low risk for diabetes, have this test once every three years after 63 years of age.  If you are overweight and have a high risk for diabetes, consider being tested at a younger age or more often. PREVENTING INFECTION  Hepatitis B  If you have a higher risk for hepatitis B, you should be screened for this virus. You are considered at high risk for hepatitis B if:  You were born in a country where hepatitis B is common. Ask your health care provider which countries are considered high risk.  Your parents were born in a high-risk country, and you have not been immunized against hepatitis B (hepatitis B vaccine).  You have HIV or AIDS.  You use needles to inject street drugs.  You live with someone who has hepatitis B.  You have had sex with someone who has hepatitis B.  You get hemodialysis treatment.  You take certain medicines for conditions, including cancer, organ transplantation, and autoimmune conditions. Hepatitis C  Blood testing is recommended for:  Everyone born from [birth year] through 1963.  Anyone with known risk factors for hepatitis C. Sexually transmitted infections (STIs)  You should be screened for sexually transmitted infections (STIs) including gonorrhea and chlamydia if:  You are  sexually active and are younger than 63 years of age.  You are older than 63 years of age and your health care provider tells you that you are at risk for this type of infection.  Your sexual activity has changed  since you were last screened and you are at an increased risk for chlamydia or gonorrhea. Ask your health care provider if you are at risk.  If you do not have HIV, but are at risk, it may be recommended that you take a prescription medicine daily to prevent HIV infection. This is called pre-exposure prophylaxis (PrEP). You are considered at risk if:  You are sexually active and do not regularly use condoms or know the HIV status of your partner(s).  You take drugs by injection.  You are sexually active with a partner who has HIV. Talk with your health care provider about whether you are at high risk of being infected with HIV. If you choose to begin PrEP, you should first be tested for HIV. You should then be tested every 3 months for as long as you are taking PrEP.  PREGNANCY   If you are premenopausal and you may become pregnant, ask your health care provider about preconception counseling.  If you may become pregnant, take 400 to 800 micrograms (mcg) of folic acid every day.  If you want to prevent pregnancy, talk to your health care provider about birth control (contraception). OSTEOPOROSIS AND MENOPAUSE   Osteoporosis is a disease in which the bones lose minerals and strength with aging. This can result in serious bone fractures. Your risk for osteoporosis can be identified using a bone density scan.  If you are 32 years of age or older, or if you are at risk for osteoporosis and fractures, ask your health care provider if you should be screened.  Ask your health care provider whether you should take a calcium or vitamin D supplement to lower your risk for osteoporosis.  Menopause may have certain physical symptoms and risks.  Hormone replacement therapy may reduce some  of these symptoms and risks. Talk to your health care provider about whether hormone replacement therapy is right for you.  HOME CARE INSTRUCTIONS   Schedule regular health, dental, and eye exams.  Stay current with your immunizations.   Do not use any tobacco products including cigarettes, chewing tobacco, or electronic cigarettes.  If you are pregnant, do not drink alcohol.  If you are breastfeeding, limit how much and how often you drink alcohol.  Limit alcohol intake to no more than 1 drink per day for nonpregnant women. One drink equals 12 ounces of beer, 5 ounces of wine, or 1 ounces of hard liquor.  Do not use street drugs.  Do not share needles.  Ask your health care provider for help if you need support or information about quitting drugs.  Tell your health care provider if you often feel depressed.  Tell your health care provider if you have ever been abused or do not feel safe at home.   This information is not intended to replace advice given to you by your health care provider. Make sure you discuss any questions you have with your health care provider.   Document Released: 09/01/2010 Document Revised: 03/09/2014 Document Reviewed: 01/18/2013 Elsevier Interactive Patient Education 2016 Hudson K. Panosh M.D.  #1 Severe microcytic hypochromic anemia  Due to iron deficiency. Patient's hemoglobin has responded very well to IV iron with improvement in her hemoglobin from 7.2 to 12.4 and now  13.1 with a normal MCV . Ferritin levels are now normal at 268   #2 severe iron deficiency likely due to chronic GI bleeding + poor oral iron absorption. She has a EGD and colonoscopy  in 2013.  Previous history of gastric ulcer requiring blood transfusions.  Also send gastric polyps and a large hernia and prone to developing Cameron ulcers, previous colonic polyps and previous bleeding hemorrhoids.  Patient reports having recent EGD and colonoscopy with Dr  [name] in April 2017 which showed a large gastric ulcer. She reports that it was noted to be benign and Helicobacter pylori negative. The reports of the studies and the pathology are not available to [place] at this time.  #3 history of poor tolerance to oral iron with significant GI distress.  She is also on chronic PPI therapy that puts her at higher risk of developing iron deficiency due to poor absorption.  And also B12 deficiency and possible vitamin D deficiency. Plan  -Patient notes good energy levels right now and is very busy at work. -She will continue to follow up with Dr. [name] Fabienne Bruns to ensure resolution of gastric ulcer. - continue follow-up with rheumatology for management of her psoriatic arthritis. -No indication for additional IV iron at this time -Reasonable to continue taking her multivitamin tablet.   Return to care with Dr.Kale in  6 months with repeat CBC, CMP and iron labs. And to follow primary care physician for management of other chronic medical issues and with Dr.Deveshwar for mx of her rheumatologic issues.  All of the patients questions were answered with apparent satisfaction. The patient knows to call the clinic with any problems, questions or concerns.  I spent 20 minutes counseling the patient face to face. The total time spent in the appointment was 20 minutes and more than 50% was on counseling and direct patient cares.    Sullivan Lone MD [place] AAHIVMS [facility] Hematology/Oncology Physician [facility]  (Office):       [phone] (Work cell):  [phone] (Fax):           [phone]

## 2015-11-26 ENCOUNTER — Encounter: Payer: Self-pay | Admitting: Internal Medicine

## 2015-11-26 ENCOUNTER — Ambulatory Visit (INDEPENDENT_AMBULATORY_CARE_PROVIDER_SITE_OTHER): Payer: BLUE CROSS/BLUE SHIELD | Admitting: Internal Medicine

## 2015-11-26 VITALS — BP 132/70 | Temp 97.7°F | Ht 63.75 in | Wt 218.4 lb

## 2015-11-26 DIAGNOSIS — D899 Disorder involving the immune mechanism, unspecified: Secondary | ICD-10-CM

## 2015-11-26 DIAGNOSIS — E785 Hyperlipidemia, unspecified: Secondary | ICD-10-CM

## 2015-11-26 DIAGNOSIS — D849 Immunodeficiency, unspecified: Secondary | ICD-10-CM | POA: Diagnosis not present

## 2015-11-26 DIAGNOSIS — Z111 Encounter for screening for respiratory tuberculosis: Secondary | ICD-10-CM | POA: Diagnosis not present

## 2015-11-26 DIAGNOSIS — Z0000 Encounter for general adult medical examination without abnormal findings: Secondary | ICD-10-CM | POA: Diagnosis not present

## 2015-11-26 DIAGNOSIS — L4050 Arthropathic psoriasis, unspecified: Secondary | ICD-10-CM | POA: Diagnosis not present

## 2015-11-26 DIAGNOSIS — I10 Essential (primary) hypertension: Secondary | ICD-10-CM | POA: Diagnosis not present

## 2015-11-26 DIAGNOSIS — Z79899 Other long term (current) drug therapy: Secondary | ICD-10-CM

## 2015-11-26 DIAGNOSIS — Z23 Encounter for immunization: Secondary | ICD-10-CM

## 2015-11-26 DIAGNOSIS — Z8639 Personal history of other endocrine, nutritional and metabolic disease: Secondary | ICD-10-CM

## 2015-11-26 NOTE — Patient Instructions (Signed)
Consider water exercise also .   bp goal below 140/90  Ok to refill meds for a year when due.  Will send a message to  Heme about adding on lipid panel with next blood tests.   Health Maintenance, Female Adopting a healthy lifestyle and getting preventive care can go a long way to promote health and wellness. Talk with your health care provider about what schedule of regular examinations is right for you. This is a good chance for you to check in with your provider about disease prevention and staying healthy. In between checkups, there are plenty of things you can do on your own. Experts have done a lot of research about which lifestyle changes and preventive measures are most likely to keep you healthy. Ask your health care provider for more information. WEIGHT AND DIET  Eat a healthy diet  Be sure to include plenty of vegetables, fruits, low-fat dairy products, and lean protein.  Do not eat a lot of foods high in solid fats, added sugars, or salt.  Get regular exercise. This is one of the most important things you can do for your health.  Most adults should exercise for at least 150 minutes each week. The exercise should increase your heart rate and make you sweat (moderate-intensity exercise).  Most adults should also do strengthening exercises at least twice a week. This is in addition to the moderate-intensity exercise.  Maintain a healthy weight  Body mass index (BMI) is a measurement that can be used to identify possible weight problems. It estimates body fat based on height and weight. Your health care provider can help determine your BMI and help you achieve or maintain a healthy weight.  For females 29 years of age and older:   A BMI below 18.5 is considered underweight.  A BMI of 18.5 to 24.9 is normal.  A BMI of 25 to 29.9 is considered overweight.  A BMI of 30 and above is considered obese.  Watch levels of cholesterol and blood lipids  You should start having  your blood tested for lipids and cholesterol at 63 years of age, then have this test every 5 years.  You may need to have your cholesterol levels checked more often if:  Your lipid or cholesterol levels are high.  You are older than 63 years of age.  You are at high risk for heart disease.  CANCER SCREENING   Lung Cancer  Lung cancer screening is recommended for adults 75-76 years old who are at high risk for lung cancer because of a history of smoking.  A yearly low-dose CT scan of the lungs is recommended for people who:  Currently smoke.  Have quit within the past 15 years.  Have at least a 30-pack-year history of smoking. A pack year is smoking an average of one pack of cigarettes a day for 1 year.  Yearly screening should continue until it has been 15 years since you quit.  Yearly screening should stop if you develop a health problem that would prevent you from having lung cancer treatment.  Breast Cancer  Practice breast self-awareness. This means understanding how your breasts normally appear and feel.  It also means doing regular breast self-exams. Let your health care provider know about any changes, no matter how small.  If you are in your 20s or 30s, you should have a clinical breast exam (CBE) by a health care provider every 1-3 years as part of a regular health exam.  If you  are 26 or older, have a CBE every year. Also consider having a breast X-ray (mammogram) every year.  If you have a family history of breast cancer, talk to your health care provider about genetic screening.  If you are at high risk for breast cancer, talk to your health care provider about having an MRI and a mammogram every year.  Breast cancer gene (BRCA) assessment is recommended for women who have family members with BRCA-related cancers. BRCA-related cancers include:  Breast.  Ovarian.  Tubal.  Peritoneal cancers.  Results of the assessment will determine the need for genetic  counseling and BRCA1 and BRCA2 testing. Cervical Cancer Your health care provider may recommend that you be screened regularly for cancer of the pelvic organs (ovaries, uterus, and vagina). This screening involves a pelvic examination, including checking for microscopic changes to the surface of your cervix (Pap test). You may be encouraged to have this screening done every 3 years, beginning at age 81.  For women ages 72-65, health care providers may recommend pelvic exams and Pap testing every 3 years, or they may recommend the Pap and pelvic exam, combined with testing for human papilloma virus (HPV), every 5 years. Some types of HPV increase your risk of cervical cancer. Testing for HPV may also be done on women of any age with unclear Pap test results.  Other health care providers may not recommend any screening for nonpregnant women who are considered low risk for pelvic cancer and who do not have symptoms. Ask your health care provider if a screening pelvic exam is right for you.  If you have had past treatment for cervical cancer or a condition that could lead to cancer, you need Pap tests and screening for cancer for at least 20 years after your treatment. If Pap tests have been discontinued, your risk factors (such as having a new sexual partner) need to be reassessed to determine if screening should resume. Some women have medical problems that increase the chance of getting cervical cancer. In these cases, your health care provider may recommend more frequent screening and Pap tests. Colorectal Cancer  This type of cancer can be detected and often prevented.  Routine colorectal cancer screening usually begins at 63 years of age and continues through 63 years of age.  Your health care provider may recommend screening at an earlier age if you have risk factors for colon cancer.  Your health care provider may also recommend using home test kits to check for hidden blood in the stool.  A  small camera at the end of a tube can be used to examine your colon directly (sigmoidoscopy or colonoscopy). This is done to check for the earliest forms of colorectal cancer.  Routine screening usually begins at age 36.  Direct examination of the colon should be repeated every 5-10 years through 63 years of age. However, you may need to be screened more often if early forms of precancerous polyps or small growths are found. Skin Cancer  Check your skin from head to toe regularly.  Tell your health care provider about any new moles or changes in moles, especially if there is a change in a mole's shape or color.  Also tell your health care provider if you have a mole that is larger than the size of a pencil eraser.  Always use sunscreen. Apply sunscreen liberally and repeatedly throughout the day.  Protect yourself by wearing long sleeves, pants, a wide-brimmed hat, and sunglasses whenever you are outside.  HEART DISEASE, DIABETES, AND HIGH BLOOD PRESSURE   High blood pressure causes heart disease and increases the risk of stroke. High blood pressure is more likely to develop in:  People who have blood pressure in the high end of the normal range (130-139/85-89 mm Hg).  People who are overweight or obese.  People who are African American.  If you are 62-67 years of age, have your blood pressure checked every 3-5 years. If you are 57 years of age or older, have your blood pressure checked every year. You should have your blood pressure measured twice--once when you are at a hospital or clinic, and once when you are not at a hospital or clinic. Record the average of the two measurements. To check your blood pressure when you are not at a hospital or clinic, you can use:  An automated blood pressure machine at a pharmacy.  A home blood pressure monitor.  If you are between 51 years and 51 years old, ask your health care provider if you should take aspirin to prevent strokes.  Have  regular diabetes screenings. This involves taking a blood sample to check your fasting blood sugar level.  If you are at a normal weight and have a low risk for diabetes, have this test once every three years after 63 years of age.  If you are overweight and have a high risk for diabetes, consider being tested at a younger age or more often. PREVENTING INFECTION  Hepatitis B  If you have a higher risk for hepatitis B, you should be screened for this virus. You are considered at high risk for hepatitis B if:  You were born in a country where hepatitis B is common. Ask your health care provider which countries are considered high risk.  Your parents were born in a high-risk country, and you have not been immunized against hepatitis B (hepatitis B vaccine).  You have HIV or AIDS.  You use needles to inject street drugs.  You live with someone who has hepatitis B.  You have had sex with someone who has hepatitis B.  You get hemodialysis treatment.  You take certain medicines for conditions, including cancer, organ transplantation, and autoimmune conditions. Hepatitis C  Blood testing is recommended for:  Everyone born from [birth year] through 1963.  Anyone with known risk factors for hepatitis C. Sexually transmitted infections (STIs)  You should be screened for sexually transmitted infections (STIs) including gonorrhea and chlamydia if:  You are sexually active and are younger than 63 years of age.  You are older than 63 years of age and your health care provider tells you that you are at risk for this type of infection.  Your sexual activity has changed since you were last screened and you are at an increased risk for chlamydia or gonorrhea. Ask your health care provider if you are at risk.  If you do not have HIV, but are at risk, it may be recommended that you take a prescription medicine daily to prevent HIV infection. This is called pre-exposure prophylaxis (PrEP). You are  considered at risk if:  You are sexually active and do not regularly use condoms or know the HIV status of your partner(s).  You take drugs by injection.  You are sexually active with a partner who has HIV. Talk with your health care provider about whether you are at high risk of being infected with HIV. If you choose to begin PrEP, you should first be tested for HIV. You  should then be tested every 3 months for as long as you are taking PrEP.  PREGNANCY   If you are premenopausal and you may become pregnant, ask your health care provider about preconception counseling.  If you may become pregnant, take 400 to 800 micrograms (mcg) of folic acid every day.  If you want to prevent pregnancy, talk to your health care provider about birth control (contraception). OSTEOPOROSIS AND MENOPAUSE   Osteoporosis is a disease in which the bones lose minerals and strength with aging. This can result in serious bone fractures. Your risk for osteoporosis can be identified using a bone density scan.  If you are 66 years of age or older, or if you are at risk for osteoporosis and fractures, ask your health care provider if you should be screened.  Ask your health care provider whether you should take a calcium or vitamin D supplement to lower your risk for osteoporosis.  Menopause may have certain physical symptoms and risks.  Hormone replacement therapy may reduce some of these symptoms and risks. Talk to your health care provider about whether hormone replacement therapy is right for you.  HOME CARE INSTRUCTIONS   Schedule regular health, dental, and eye exams.  Stay current with your immunizations.   Do not use any tobacco products including cigarettes, chewing tobacco, or electronic cigarettes.  If you are pregnant, do not drink alcohol.  If you are breastfeeding, limit how much and how often you drink alcohol.  Limit alcohol intake to no more than 1 drink per day for nonpregnant women. One  drink equals 12 ounces of beer, 5 ounces of wine, or 1 ounces of hard liquor.  Do not use street drugs.  Do not share needles.  Ask your health care provider for help if you need support or information about quitting drugs.  Tell your health care provider if you often feel depressed.  Tell your health care provider if you have ever been abused or do not feel safe at home.   This information is not intended to replace advice given to you by your health care provider. Make sure you discuss any questions you have with your health care provider.   Document Released: 09/01/2010 Document Revised: 03/09/2014 Document Reviewed: 01/18/2013 Elsevier Interactive Patient Education [employer].

## 2015-11-28 ENCOUNTER — Telehealth: Payer: Self-pay | Admitting: Hematology

## 2015-11-28 ENCOUNTER — Encounter: Payer: Self-pay | Admitting: Internal Medicine

## 2015-11-28 NOTE — Telephone Encounter (Signed)
Spoke with patient re March appointments. Per patient lab moved to be one week prior to f/u. Per patient left message for desk nurse to let Dr. Irene Limbo know her ulcer is gone and she is cured of the ulcer.

## 2015-11-29 ENCOUNTER — Telehealth (HOSPITAL_COMMUNITY): Payer: Self-pay | Admitting: *Deleted

## 2015-11-29 LAB — TB SKIN TEST
Induration: 0 mm
TB Skin Test: NEGATIVE

## 2015-11-29 NOTE — Telephone Encounter (Signed)
Pt called to inform Dr. Irene Limbo her gastric ulcer has now resolved.

## 2016-01-22 ENCOUNTER — Other Ambulatory Visit: Payer: Self-pay | Admitting: Internal Medicine

## 2016-01-22 NOTE — Telephone Encounter (Signed)
Sent to the pharmacy by e-scribe for 1 year.  Pt scheduled for cpx on 11/30/16.

## 2016-02-01 ENCOUNTER — Other Ambulatory Visit: Payer: Self-pay | Admitting: Rheumatology

## 2016-02-03 ENCOUNTER — Telehealth: Payer: Self-pay | Admitting: Rheumatology

## 2016-02-03 NOTE — Telephone Encounter (Signed)
PPD-11/29/15 Negative

## 2016-02-03 NOTE — Telephone Encounter (Signed)
Patient says the pharmacy has sent over refill request for Simponi and patient states she is over due for the medication so she is requesting that it be sent today in order for the pharmacy to have it ready for her by tomorrow. Patient uses [facility] in [place]

## 2016-02-03 NOTE — Telephone Encounter (Signed)
Last Visit: 09/18/15 Next Visit: 02/19/16 Labs: 11/08/15 WNL  Okay to refill Simponi?

## 2016-02-03 NOTE — Telephone Encounter (Signed)
When was TB test done ?

## 2016-02-04 NOTE — Telephone Encounter (Signed)
Patient advised prescription has been sent to the pharmacy.  

## 2016-02-18 DIAGNOSIS — Z79899 Other long term (current) drug therapy: Secondary | ICD-10-CM | POA: Insufficient documentation

## 2016-02-18 DIAGNOSIS — L408 Other psoriasis: Secondary | ICD-10-CM | POA: Insufficient documentation

## 2016-02-18 DIAGNOSIS — M47812 Spondylosis without myelopathy or radiculopathy, cervical region: Secondary | ICD-10-CM | POA: Insufficient documentation

## 2016-02-18 NOTE — Progress Notes (Signed)
Office Visit Note  Patient: Jade Cardenas             Date of Birth: 04-15-1950           MRN: [id number]             PCP: Lottie Dawson, MD Referring: [name], MD Visit Date: 02/19/2016 Occupation: _0 @    Subjective:  Pain of the Neck (With headache ); Pain of the Right Foot; Pain of the Left Foot; and Fatigue Follow-up on psoriasis and psoriatic arthritis and high-risk prescription and myofascial pain syndrome  History of Present Illness: Jade Cardenas is a 65 y.o. female  Last seen on 09/18/2015 Patient is flaring a little bit more with her psoriatic arthritis and psoriasis. There is a concern that the Symphony subcutaneous monthly injections not as effective as they were 2 years ago. We wanted to add methotrexate on board but she patient has had GI symptoms when she used methotrexate and even array for. She could not tolerate it has let either.  Patient also has fibromyalgia syndrome which may be exacerbated.  She's having anemia (microcytic) and peptic ulcer disease and has been seeing Dr. Irene Limbo.    Activities of Daily Living:  Patient reports morning stiffness for 60  minutes.   Patient Reports nocturnal pain.  Difficulty dressing/grooming: Reports Difficulty climbing stairs: Reports Difficulty getting out of chair: Reports Difficulty using hands for taps, buttons, cutlery, and/or writing: Reports   Review of Systems  Constitutional: Positive for fatigue.  HENT: Negative for mouth sores and mouth dryness.   Eyes: Negative for dryness.  Respiratory: Negative for shortness of breath.   Gastrointestinal: Negative for constipation and diarrhea.  Musculoskeletal: Positive for myalgias and myalgias.  Skin: Negative for sensitivity to sunlight.  Psychiatric/Behavioral: Positive for sleep disturbance. Negative for decreased concentration.    PMFS History:  Patient Active Problem List   Diagnosis Date Noted  . Other psoriasis 02/18/2016  .  High risk medication use 02/18/2016  . DJD (degenerative joint disease), cervical 02/18/2016  . Heel spur 09/29/2015  . Ingrown toenail 09/29/2015  . Iron deficiency anemia due to chronic blood loss 08/14/2015  . Foraminal stenosis of cervical region 07/30/2015  . Microcytic hypochromic anemia 05/22/2015  . Symptomatic anemia 05/22/2015  . Tailor's bunion, left 01/18/2015  . H/O hiatal hernia   . Diarrhea 03/01/2014  . Irritable bowel syndrome (IBS) 03/01/2014  . Primary osteoarthritis involving multiple joints 12/15/2013  . Iron deficiency 12/15/2013  . Psoriatic arthritis ([place]) 12/15/2013  . History of foot fracture 12/15/2013  . Dermatitis 10/17/2013  . 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 Arthritis 05/31/2013  . Medication side effect 05/31/2013  . 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 Skin lesion 12/09/2012  . Tinnitus 06/23/2012  . Scalp lesion 01/21/2012  . Headache(784.0) 01/21/2012  . Hx of psoriasis 01/21/2012  . Weight gain 01/21/2012  . Ankylosing spondylitis  possible 09/03/2011  . Hx of transfusion 09/03/2011  . S/P lumbar fusion 6 13 09/03/2011  . Lumbar stenosis with neurogenic claudication 08/05/2011  . LBP (low back pain) 01/17/2011  . ALLERGIC RHINITIS 02/19/2010  . GERD 02/19/2010  . ARTHRITIS 02/19/2010  . SPINAL STENOSIS, CERVICAL 02/19/2010  . OSTEOPENIA 02/19/2010  . WEIGHT GAIN 12/18/2008  . RASH AND OTHER NONSPECIFIC SKIN ERUPTION 12/07/2007  . IBS 04/28/2007  . PALPITATIONS 02/09/2007  . GASTRIC ULCER, ACUTE, HEMORRHAGE, HX OF 02/09/2007  . COLONIC POLYPS, HX OF 02/09/2007  .  REDUCTION MAMMOPLASTY, HX OF 02/09/2007  . CHOLECYSTECTOMY, HX OF 02/09/2007  . Hyperlipidemia 12/29/2006  . Essential hypertension 12/29/2006    Past Medical History:  Diagnosis Date  . Abnormal Pap smear    H/O  . Anemia    iron def  . Arthritis    rheumatoid and osteo now flt to be psoriateic   . Arthritis    psoreratic,  osteoarthritis  . Broken toe   . Bruises easily   . CHOLECYSTECTOMY, HX OF   . Chronic back pain   . COLONIC POLYPS, HX OF 02/09/2007  . Cough, persistent 01/21/2012   poss from acei   . 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 Fibromyalgia    doesn't require meds  . GASTRIC ULCER, ACUTE, HEMORRHAGE, HX OF 02/09/2007  . GERD (gastroesophageal reflux disease)    takes Nexium daily  . Goiter   . H/O hiatal hernia   . Headache(784.0)    occasionally  . History of bronchitis   . Hx of seasonal allergies    takes Claritin daily  . HYPERLIPIDEMIA 12/29/2006   takes Lovastatin nightly  . HYPERTENSION 12/29/2006   takes Enalapril daily  . IBS 04/28/2007  . Insomnia    related to pain;takes Flexeril and Tylenol PM nightly  . Joint pain   . Joint swelling   . MENOPAUSAL DISORDER 12/07/2007  . Palpitations 02/09/2007  . PONV (postoperative nausea and vomiting)    NAUSEA  . REDUCTION MAMMOPLASTY, HX OF 02/09/2007  . Rheumatoid arthritis(714.0) 12/29/2006  . Right bundle branch block 02/09/2007  . S/P lumbar fusion 6 13 09/03/2011   L4 L5  posterior   . SYNCOPE 12/07/2007  . UNS ADVRS EFF OTH RX MEDICINAL\T\BIOLOGICAL SBSTNC 02/09/2007  . UTI (lower urinary tract infection)    hx of;saw urologist last yr and he told her she was over reacting and not a big deal   . WEIGHT GAIN 12/18/2008    Family History  Problem Relation Age of Onset  . Diabetes Mother   . Colon cancer Mother     x2  . Arthritis Mother   . Hypertension Mother   . Endometrial cancer Mother   . Stroke Father   . Heart disease Father   . Colon cancer Father   . Arthritis Brother   . Hypertension Brother   . Hyperlipidemia Brother   . Other      DISH brother  . Hypertension Maternal Grandmother   . Stroke Maternal Grandmother   . Infertility Brother   . Anesthesia problems Neg Hx   . Hypotension Neg Hx   . Malignant hyperthermia Neg Hx   . Pseudochol deficiency Neg Hx    Past Surgical History:  Procedure  Laterality Date  . ABDOMINAL HYSTERECTOMY  1986   TAH/BSO  . BACK SURGERY  12+yrs ago   Synovial Cyst removal  . BACK SURGERY  6/13   lumbar L4-L5 fusion, lamenectomy  . blood transfused  6/13   transfused 2 units post op from back surgery  . bone spur  6-83yr ago   right ankle  . BREAST REDUCTION SURGERY     last 80's  . BUNIONECTOMY  10/03/14   right foot  . CERVICAL FUSION     2 12   . CHOLECYSTECTOMY  1996  . COLONOSCOPY    . ESOPHAGOGASTRODUODENOSCOPY ENDOSCOPY    . POSTERIOR CERVICAL LAMINECTOMY Left 07/30/2015   Procedure: Laminectomy and Foraminotomy - left - Cervical two -Cervical three;  Surgeon: HEarnie Larsson  MD;  Location: [facility];  Service: Neurosurgery;  Laterality: Left;  . 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 She appears well-developed and well-nourished.  HENT:  Head: Normocephalic and atraumatic.  Eyes: EOM are normal. Pupils are equal, round, and reactive to light.  Cardiovascular: Normal rate, regular rhythm and normal heart sounds.  Exam reveals no gallop and no friction rub.   No murmur heard. Pulmonary/Chest: Effort normal and breath sounds normal. She has no wheezes. She has no rales.  Abdominal: Soft. Bowel sounds are normal. She exhibits no distension. There is no tenderness. There is no guarding. No hernia.  Musculoskeletal: Normal range of motion. She exhibits no edema, tenderness or deformity.  Lymphadenopathy:    She has no cervical adenopathy.  Neurological: She is alert and oriented to person, place, and time. Coordination normal.  Skin: Skin is  warm and dry. Capillary refill takes less than 2 seconds. No rash noted.  Psychiatric: She has a normal mood and affect. Her behavior is normal.     Musculoskeletal Exam:  Full range of motion of all joints except decreased range of motion of the C-spine. Grip strength is equal and strong bilaterally   CDAI Exam: CDAI Homunculus Exam:   Tenderness:  RUE: wrist Right hand: 2nd MCP and 5th MCP  Swelling:  RUE: wrist Right hand: 2nd MCP and 5th MCP  Joint Counts:  CDAI Tender Joint count: 3 CDAI Swollen Joint count: 3  Global Assessments:  Patient Global Assessment: 7 Provider Global Assessment: 7  CDAI Calculated Score: 20  Neck pain is described as 8-9 on a pain scale of 0-10 Overall globally, pain is about 7 from psoriatic arthritis being poorly controlled. Patient is left-hand dominant but most of her synovitis is on her right wrist and second and fifth MCP joint of the right hand. Patient is taking her monthly injectable SIMPONI subcutaneous) without fail.  Investigation: Findings:  11/2015 negative PPD  High risk medication use /Simponi Sub q OTEZLA caused diarrhea and nausea, METHOTREXATE and ARAVA cause diarrhea and stomach cramps.  01/07/15 X-rays of the C-spine, 2 views, are consistent with some hardware noted, 2 plates.    X-rays of bilateral hands, 2 views, compared to 10/27/2013 show bilateral PIP and DIP and MCP joint space narrowing.  Bilateral intercarpal joint space narrowing, bilateral radiocarpal joint space narrowing.  No change versus August 2015.  August 2015:  Comprehensive metabolic panel, hepatitis panel, TSH, HIV, immunoglobulins, SPEP, UA, ANA, rheumatoid factor, CCP, HLA-B27 were all within normal limits.  Her TB Gold was indeterminate.  Sed rate was elevated at 38 and CK was elevated at 258.     Imaging: No results found.  Speciality Comments: No specialty comments available.    Procedures:  Trigger Point Inj Date/Time: 02/19/2016  11:19 AM Performed by: Eliezer Lofts Authorized by: Eliezer Lofts   Consent Given by:  Patient Site marked: the procedure site was marked   Timeout: prior to procedure  the correct patient, procedure, and site was verified   Indications:  Muscle spasm and pain Total # of Trigger Points:  2 Location: neck   Needle Size:  27 G Approach:  Dorsal Medications #1:  0.5 mL lidocaine 1 %; 10 mg triamcinolone acetonide 40 MG/ML Medications #2:  0.5 mL lidocaine 1 %; 10 mg triamcinolone acetonide 40 MG/ML Patient tolerance:  Patient tolerated the procedure well with no immediate complications Comments: Patient is having improvement a couple of minutes after the injection.   Allergies: Erythromycin; Iodinated diagnostic agents; Ace inhibitors; Dilaudid [hydromorphone hcl]; Morphine and related; Keflex [cephalexin]; and Macrobid [nitrofurantoin]   Assessment / Plan:     Visit Diagnoses: Psoriatic arthritis ([place]) - 02/19/2016: More flares of joint pain lately;  Other psoriasis - February 19 2016: More flares of psoriasis lately;  High risk medication use - Simponi sub q injections monthly;OTEZLA caused diarrhea and nausea, METHOTREXATE and ARAVA cause diarrhea and stomach cramps.; - Plan: Ferritin, CBC with Differential/Platelet, COMPLETE METABOLIC PANEL WITH GFR, CBC with Differential/Platelet, COMPLETE METABOLIC PANEL WITH GFR, CBC with Differential/Platelet, COMPLETE METABOLIC PANEL WITH GFR  DJD (degenerative joint disease), cervical - s/p fusion  Lumbar stenosis with neurogenic claudication  Iron deficiency - Seeing Dr. Irene Limbo; got infusions; - Plan: Ferritin, CBC with Differential/Platelet, CBC with Differential/Platelet  Primary osteoarthritis involving multiple joints  Trapezius muscle spasm - Plan: Trigger Point Injection    Plan: #1: Patient is not doing as well with simponi subQ lately as she has been doing earlier when she started the medication 2 years ago. As a result, she is  agreeable to do dual therapy with methotrexate. Note: She had GI upset when she was on oral methotrexate but she has never tried injectable methotrexate. After long and thorough discussion, patient has agreed to try injectable methotrexate. We will start with low-dose injectable methotrexate at 0.4 mils every week and then we will see her back in 3 months to see how she is responded to this. We wanted to do low dose to ensure that she does not have GI upset. If she responds well to this medication she can always call us in a few weeks and we can increase the dose to 0.6 mL's per   #2: Return to clinic in 2 weeks to do CBC with differential CMP with GFR to see how well she is tolerating the methotrexate; then she'll get labs every 3 months thereafter.  #3: New prescription for methotrexate 50 mg per 2 ML's; 0.4 ML subcutaneous every week; dispense 6 mL's with preservatives.  #4: New prescription folic acid 1 mg; 1 daily; ninety-day supply with 4 refills  #5: Tuberculin syringe dispensed 12 with 4 refills  #6: We discussed hand exercise with the patient for the osteoarthritis of the hands  #7: Continue symponi subcutaneous 50 mg every month. Note that we can increase the medication to 100 mg in the future when the need arises. Patient understands and is agreeable.  #8: Patient received trapezius muscle injection today. Few minutes after the injection patient was feeling some relief already. I've advised the patient that we can do lidocaine only injection in the future if she prefers and in 5 months from today we can repeat the cortisone injection to the trapezius muscle if indicated and if the pain as severe and if it did not resolve with Voltaren gel and other interventions.  #9: Return to clinic in 5 months   Orders: Orders Placed This Encounter  Procedures  . Trigger Point  Injection  . Ferritin  . CBC with Differential/Platelet  . COMPLETE METABOLIC PANEL WITH GFR  . CBC with  Differential/Platelet  . COMPLETE METABOLIC PANEL WITH GFR   Meds ordered this encounter  Medications  . methotrexate 50 MG/2ML injection    Sig: Inject 0.4 mLs (10 mg total) into the skin once.    Dispense:  6 mL    Refill:  0    Dispense 2 ML vials with preservatives; give enough to last for 3 months    Order Specific Question:   Supervising Provider    Answer:   Bo Merino [[id number]]  . folic acid (FOLVITE) 1 MG tablet    Sig: Take 1 tablet (1 mg total) by mouth daily.    Dispense:  90 tablet    Refill:  4    Order Specific Question:   Supervising Provider    Answer:   Bo Merino [[id number]]  . Tuberculin-Allergy Syringes 27G X 1/2" 1 ML KIT    Sig: Inject 1 Syringe into the skin once a week. If syringe size greater than 29m, please call me at my office.    Dispense:  12 each    Refill:  4    Order Specific Question:   Supervising Provider    Answer:   DBo Merino[[phone]   Face-to-face time spent with patient was 30 minutes. 50% of time was spent in counseling and coordination of care.  Follow-Up Instructions: Return in about 5 months (around 07/19/2016) for PsA, Ps, Simponi SubQ monthly, mfps, anemia,.   NEliezer Lofts PA-C   I examined patient with Mr. PCarlyon Shadowon today. Patient continues to have some synovitis. We will initiate methotrexate as described above. Indications side effects contraindications were discussed at length. I agree with the above plan.  I examined and evaluated the patient with NEliezer LoftsPA. The plan of care was discussed as noted above.  SBo Merino MD

## 2016-02-19 ENCOUNTER — Ambulatory Visit (INDEPENDENT_AMBULATORY_CARE_PROVIDER_SITE_OTHER): Payer: BLUE CROSS/BLUE SHIELD | Admitting: Rheumatology

## 2016-02-19 ENCOUNTER — Other Ambulatory Visit: Payer: Self-pay | Admitting: Internal Medicine

## 2016-02-19 ENCOUNTER — Encounter: Payer: Self-pay | Admitting: Rheumatology

## 2016-02-19 VITALS — BP 128/74 | HR 68 | Resp 16 | Ht 66.0 in | Wt 224.0 lb

## 2016-02-19 DIAGNOSIS — M150 Primary generalized (osteo)arthritis: Secondary | ICD-10-CM

## 2016-02-19 DIAGNOSIS — M8949 Other hypertrophic osteoarthropathy, multiple sites: Secondary | ICD-10-CM

## 2016-02-19 DIAGNOSIS — L4050 Arthropathic psoriasis, unspecified: Secondary | ICD-10-CM

## 2016-02-19 DIAGNOSIS — M62838 Other muscle spasm: Secondary | ICD-10-CM

## 2016-02-19 DIAGNOSIS — Z79899 Other long term (current) drug therapy: Secondary | ICD-10-CM

## 2016-02-19 DIAGNOSIS — M47812 Spondylosis without myelopathy or radiculopathy, cervical region: Secondary | ICD-10-CM

## 2016-02-19 DIAGNOSIS — L408 Other psoriasis: Secondary | ICD-10-CM | POA: Diagnosis not present

## 2016-02-19 DIAGNOSIS — M159 Polyosteoarthritis, unspecified: Secondary | ICD-10-CM

## 2016-02-19 DIAGNOSIS — M48062 Spinal stenosis, lumbar region with neurogenic claudication: Secondary | ICD-10-CM

## 2016-02-19 DIAGNOSIS — M5030 Other cervical disc degeneration, unspecified cervical region: Secondary | ICD-10-CM | POA: Diagnosis not present

## 2016-02-19 DIAGNOSIS — E611 Iron deficiency: Secondary | ICD-10-CM | POA: Diagnosis not present

## 2016-02-19 LAB — CBC WITH DIFFERENTIAL/PLATELET
Basophils Absolute: 0 cells/uL (ref 0–200)
Basophils Relative: 0 %
Eosinophils Absolute: 59 cells/uL (ref 15–500)
Eosinophils Relative: 1 %
HCT: 41.2 % (ref 35.0–45.0)
Hemoglobin: 14.1 g/dL (ref 11.7–15.5)
Lymphocytes Relative: 26 %
Lymphs Abs: 1534 cells/uL (ref 850–3900)
MCH: 31.5 pg (ref 27.0–33.0)
MCHC: 34.2 g/dL (ref 32.0–36.0)
MCV: 92 fL (ref 80.0–100.0)
MPV: 9.5 fL (ref 7.5–12.5)
Monocytes Absolute: 295 cells/uL (ref 200–950)
Monocytes Relative: 5 %
Neutro Abs: 4012 cells/uL (ref 1500–7800)
Neutrophils Relative %: 68 %
Platelets: 319 10*3/uL (ref 140–400)
RBC: 4.48 MIL/uL (ref 3.80–5.10)
RDW: 13.6 % (ref 11.0–15.0)
WBC: 5.9 10*3/uL (ref 3.8–10.8)

## 2016-02-19 LAB — COMPLETE METABOLIC PANEL WITH GFR
ALT: 24 U/L (ref 6–29)
AST: 21 U/L (ref 10–35)
Albumin: 4.5 g/dL (ref 3.6–5.1)
Alkaline Phosphatase: 97 U/L (ref 33–130)
BUN: 16 mg/dL (ref 7–25)
CO2: 25 mmol/L (ref 20–31)
Calcium: 9.7 mg/dL (ref 8.6–10.4)
Chloride: 103 mmol/L (ref 98–110)
Creat: 0.73 mg/dL (ref 0.50–0.99)
GFR, Est African American: >89 mL/min (ref >=60)
GFR, Est Non African American: 88 mL/min (ref >=60)
Glucose, Bld: 85 mg/dL (ref 65–99)
Potassium: 4.9 mmol/L (ref 3.5–5.3)
Sodium: 140 mmol/L (ref 135–146)
Total Bilirubin: 0.6 mg/dL (ref 0.2–1.2)
Total Protein: 7.5 g/dL (ref 6.1–8.1)

## 2016-02-19 MED ORDER — TRIAMCINOLONE ACETONIDE 40 MG/ML IJ SUSP
10.0000 mg | INTRAMUSCULAR | Status: AC | PRN
  Administered 2016-02-19: 10 mg via INTRAMUSCULAR

## 2016-02-19 MED ORDER — METHOTREXATE SODIUM CHEMO INJECTION 50 MG/2ML: 10.0000 mg | Freq: Once | INTRAMUSCULAR | 0 refills | Status: DC

## 2016-02-19 MED ORDER — LIDOCAINE HCL 1 % IJ SOLN
0.5000 mL | INTRAMUSCULAR | Status: AC | PRN
  Administered 2016-02-19: .5 mL

## 2016-02-19 MED ORDER — FOLIC ACID 1 MG PO TABS: 1.0000 mg | ORAL_TABLET | Freq: Every day | ORAL | 4 refills | Status: DC

## 2016-02-19 MED ORDER — "TUBERCULIN-ALLERGY SYRINGES 27G X 1/2"" 1 ML KIT": 1.0000 | PACK | 4 refills | Status: DC

## 2016-02-19 NOTE — Patient Instructions (Addendum)
Standing Labs We placed an order today for your standing lab work.    Please come back and get your standing labs in 2 weeks and every 3 months  We have open lab Monday through Friday from 8:30-11:30 AM and 1:30-4 PM at the office of Dr. Tresa Moore, PA.   The office is located at [address] [place], [place], [place] [address] No appointment is necessary.   Labs are drawn by [facility].  You may receive a bill from [facility] for your lab work.     Methotrexate subcutaneous injection What is this medicine? METHOTREXATE (METH oh TREX ate) is a cytotoxic drug that also suppresses the immune system. It is used to treat psoriasis and rheumatoid arthritis. COMMON BRAND NAME(S): Otrexup, Rasuvo What should I tell my health care provider before I take this medicine? They need to know if you have any of these conditions: -fluid in the stomach area or lungs -if you often drink alcohol -infection or immune system problems -kidney disease -liver disease -low blood counts, like low white cell, platelet, or red cell counts -lung disease -radiation therapy -stomach ulcers -ulcerative colitis -an unusual or allergic reaction to methotrexate, other medicines, foods, dyes, or preservatives -pregnant or trying to get pregnant -breast-feeding How should I use this medicine? This medicine is for injection under the skin. You will be taught how to prepare and give this medicine. Refer to the Instructions for Use that come with your medication packaging. Use exactly as directed. Take your medicine at regular intervals. Do not take your medicine more often than directed. This medicine should be taken weekly, NOT daily. It is important that you put your used needles and syringes in a special sharps container. Do not put them in a trash can. If you do not have a sharps container, call your pharmacist of healthcare provider to get one. Talk to your pediatrician regarding the use of this  medicine in children. While this drug may be prescribed for children as young as 2 years for selected conditions, precautions do apply. What if I miss a dose? If you are not sure if this medicine was injected or if you have a hard time giving the injection, do not inject another dose. Talk with your doctor or health care professional. What may interact with this medicine? This medicine may interact with the following medications: -acitretin -aspirin or aspirin-like medicines including salicylates -azathioprine -certain antibiotics like chloramphenicol, penicillin, tetracycline -cyclosporine -gold -hydroxychloroquine -live virus vaccines -mercaptopurine -NSAIDs, medicines for pain and inflammation, like ibuprofen or naproxen -other cytotoxic agents -penicillamine -phenylbutazone -phenytoin -probenacid -retinoids such as isotretinoin and tretinoin -steroid medicines like prednisone or cortisone -sulfonamides like sulfasalazine and trimethoprim/sulfamethoxazole -theophylline What should I watch for while using this medicine? Avoid alcoholic drinks. This medicine can make you more sensitive to the sun. Keep out of the sun. If you cannot avoid being in the sun, wear protective clothing and use sunscreen. Do not use sun lamps or tanning beds/booths. You may get drowsy or dizzy. Do not drive, use machinery, or do anything that needs mental alertness until you know how this medicine affects you. Do not stand or sit up quickly, especially if you are an older patient. This reduces the risk of dizzy or fainting spells. You may need blood work done while you are taking this medicine. Call your doctor or health care professional for advice if you get a fever, chills or sore throat, or other symptoms of a cold or flu. Do not  treat yourself. This drug decreases your body's ability to fight infections. Try to avoid being around people who are sick. This medicine may increase your risk to bruise or  bleed. Call your doctor or health care professional if you notice any unusual bleeding. Check with your doctor or health care professional if you get an attack of severe diarrhea, nausea and vomiting, or if you sweat a lot. The loss of too much body fluid can make it dangerous for you to take this medicine. Talk to your doctor about your risk of cancer. You may be more at risk for certain types of cancers if you take this medicine. Both men and women must use effective birth control with this medicine. Do not become pregnant while taking this medicine or until at least 1 normal menstrual cycle has occurred after stopping it. Women should inform their doctor if they wish to become pregnant or think they might be pregnant. Men should not father a child while taking this medicine and for 3 months after stopping it. There is a potential for serious side effects to an unborn child. Talk to your health care professional or pharmacist for more information. Do not breast-feed an infant while taking this medicine. What side effects may I notice from receiving this medicine? Side effects that you should report to your doctor or health care professional as soon as possible: -allergic reactions like skin rash, itching or hives, swelling of the face, lips, or tongue -breathing problems or shortness of breath -diarrhea -dry, nonproductive cough -low blood counts - this medicine may decrease the number of white blood cells, red blood cells and platelets. You may be at increased risk of infections and bleeding -mouth sores -redness, blistering, peeling or loosening of the skin, including inside the mouth -signs of infection - fever or chills, cough, sore throat, pain or difficulty passing urine -signs and symptoms of bleeding such as bloody or black, tarry stools; red or dark-brown urine; spitting up blood or brown material that looks like coffee grounds; red spots on the skin; unusual bruising or bleeding from the  eye, gums, or nose -signs and symptoms of kidney injury like trouble passing urine or change in the amount of urine -signs and symptoms of liver injury like dark yellow or brown urine; general ill feeling or flu-like symptoms; light-colored stools; loss of appetite; nausea; right upper belly pain; unusually weak or tired; yellowing of the eyes or skin Side effects that usually do not require medical attention (report to your doctor or health care professional if they continue or are bothersome): -dizziness -hair loss -headache -stomach pain -upset stomach -vomiting Where should I keep my medicine? Keep out of the reach of children. You will be instructed on how to store this medicine. Throw away any unused medicine after the expiration date on the label.  2017 [employer]/Gold Standard (2015-03-21 11:50:46)

## 2016-02-19 NOTE — Progress Notes (Signed)
Pharmacy Note Subjective: Patient presents today to [facility] to see Dr. Lacy Duverney. [place].  Patient seen by the pharmacist for counseling on methotrexate.    Objective: CBC    Component Value Date/Time   WBC 6.4 11/08/2015 1503   WBC 5.5 07/18/2015 1052   RBC 4.17 11/08/2015 1503   RBC 4.55 07/18/2015 1052   HGB 13.1 11/08/2015 1503   HCT 37.2 11/08/2015 1503   PLT 262 11/08/2015 1503   MCV 89.2 11/08/2015 1503   MCH 31.4 11/08/2015 1503   MCH 27.3 07/18/2015 1052   MCHC 35.2 11/08/2015 1503   MCHC 33.0 07/18/2015 1052   RDW 13.2 11/08/2015 1503   LYMPHSABS 1.7 11/08/2015 1503   MONOABS 0.4 11/08/2015 1503   EOSABS 0.1 11/08/2015 1503   BASOSABS 0.0 11/08/2015 1503    CMP     Component Value Date/Time   NA 140 11/08/2015 1503   K 3.7 11/08/2015 1503   CL 105 07/18/2015 1052   CO2 24 11/08/2015 1503   GLUCOSE 115 11/08/2015 1503   BUN 18.5 11/08/2015 1503   CREATININE 0.8 11/08/2015 1503   CALCIUM 9.4 11/08/2015 1503   PROT 7.6 11/08/2015 1503   ALBUMIN 3.7 11/08/2015 1503   AST 21 11/08/2015 1503   ALT 25 11/08/2015 1503   ALKPHOS 103 11/08/2015 1503   BILITOT 0.50 11/08/2015 1503   GFRNONAA >60 07/18/2015 1052   GFRNONAA 80 11/29/2014 0913   GFRAA >60 07/18/2015 1052   GFRAA >89 11/29/2014 0913    TB Skin test: negative (11/2015) Hepatitis panel: negative (10/27/13) HIV: negative (10/27/13)  Chest-xray:  11/03/13: "IMPRESSION: No acute findings"  Assessment/Plan:  Patient was counseled on the purpose, proper use, and adverse effects of methotrexate including nausea, infection, and signs and symptoms of pneumonitis.  Reviewed instructions with patient to inject methotrexate 0.4 mL weekly and to take folic acid by mouth 1 mg daily.  Discussed the importance of frequent monitoring of kidney and liver function and blood counts, and provided patient with standing lab instructions.  Counseled patient to avoid sulfa antibiotics such as Bactrim  or Septra while on methotrexate.  Provided patient with educational materials on methotrexate and answered all questions.  Advised patient to get annual influenza vaccine and to get a pneumococcal vaccine if patient has not already had one.  Patient voiced understanding.  Educated patient on how to use a vial and syringe and reviewed injection technique with patient.  Patient reports she has a friend who is a nurse who assist with her Simponi injections.  She plans to have her friend administer her methotrexate.  Provided patient on educational material regarding injection technique and storage of methotrexate.  Advised patient to call us if she has any questions regarding injecting methotrexate.    Elisabeth Most, Pharm.D., BCPS Clinical Pharmacist Pager: [phone] Phone: [phone] 02/19/2016 11:22 AM

## 2016-02-19 NOTE — Telephone Encounter (Signed)
Dr. Regis Bill would like for a lipid panel to be added to these labs if Dr will order.  Please advise.  Thanks!!

## 2016-02-19 NOTE — Telephone Encounter (Signed)
Message sent to oncology to see if they will add on Lipid.

## 2016-02-20 LAB — FERRITIN: Ferritin: 192 ng/mL (ref 20–288)

## 2016-02-21 NOTE — Telephone Encounter (Signed)
Ok to refill x 6 months so she doesn't run out   But  Does need  Updated lipid panel .

## 2016-02-22 ENCOUNTER — Other Ambulatory Visit: Payer: Self-pay | Admitting: Rheumatology

## 2016-02-26 NOTE — Telephone Encounter (Signed)
11/2015 negative PPD  02/19/16 last visit Next visit 05/20/16 Labs WNL 02/19/16 Ok to refill per Dr Estanislado Pandy

## 2016-02-27 NOTE — Progress Notes (Signed)
Tell patient#1: CMP with GFR normal#2 CBC with differential is normal#3 ferritin is normal#4: No change in treatment. Keep appointment 03/13/2016

## 2016-03-05 ENCOUNTER — Other Ambulatory Visit: Payer: Self-pay | Admitting: *Deleted

## 2016-03-05 DIAGNOSIS — Z79899 Other long term (current) drug therapy: Secondary | ICD-10-CM

## 2016-03-05 DIAGNOSIS — E611 Iron deficiency: Secondary | ICD-10-CM

## 2016-03-05 LAB — CBC WITH DIFFERENTIAL/PLATELET
Basophils Absolute: 0 cells/uL (ref 0–200)
Basophils Relative: 0 %
Eosinophils Absolute: 67 cells/uL (ref 15–500)
Eosinophils Relative: 1 %
HCT: 41.5 % (ref 35.0–45.0)
Hemoglobin: 14 g/dL (ref 11.7–15.5)
Lymphocytes Relative: 28 %
Lymphs Abs: 1876 cells/uL (ref 850–3900)
MCH: 31.8 pg (ref 27.0–33.0)
MCHC: 33.7 g/dL (ref 32.0–36.0)
MCV: 94.3 fL (ref 80.0–100.0)
MPV: 9.4 fL (ref 7.5–12.5)
Monocytes Absolute: 402 cells/uL (ref 200–950)
Monocytes Relative: 6 %
Neutro Abs: 4355 cells/uL (ref 1500–7800)
Neutrophils Relative %: 65 %
Platelets: 359 10*3/uL (ref 140–400)
RBC: 4.4 MIL/uL (ref 3.80–5.10)
RDW: 13.8 % (ref 11.0–15.0)
WBC: 6.7 10*3/uL (ref 3.8–10.8)

## 2016-03-05 LAB — COMPLETE METABOLIC PANEL WITH GFR
ALT: 21 U/L (ref 6–29)
AST: 22 U/L (ref 10–35)
Albumin: 4.3 g/dL (ref 3.6–5.1)
Alkaline Phosphatase: 100 U/L (ref 33–130)
BUN: 19 mg/dL (ref 7–25)
CO2: 25 mmol/L (ref 20–31)
Calcium: 9.5 mg/dL (ref 8.6–10.4)
Chloride: 104 mmol/L (ref 98–110)
Creat: 0.78 mg/dL (ref 0.50–0.99)
GFR, Est African American: >89 mL/min (ref >=60)
GFR, Est Non African American: 81 mL/min (ref >=60)
Glucose, Bld: 107 mg/dL — ABNORMAL HIGH (ref 65–99)
Potassium: 4.6 mmol/L (ref 3.5–5.3)
Sodium: 142 mmol/L (ref 135–146)
Total Bilirubin: 0.7 mg/dL (ref 0.2–1.2)
Total Protein: 7.3 g/dL (ref 6.1–8.1)

## 2016-03-06 ENCOUNTER — Telehealth: Payer: Self-pay | Admitting: Rheumatology

## 2016-03-06 NOTE — Telephone Encounter (Signed)
Patient has questions about MTX that is due today. Please call.

## 2016-03-09 NOTE — Telephone Encounter (Signed)
Reviewed patient's lab results with her and advised patient according to the last office visit she is to remain on the 0.4 mL of Methotrexate. Patient verbalized understanding.

## 2016-03-13 ENCOUNTER — Ambulatory Visit (INDEPENDENT_AMBULATORY_CARE_PROVIDER_SITE_OTHER): Payer: BLUE CROSS/BLUE SHIELD | Admitting: Obstetrics & Gynecology

## 2016-03-13 ENCOUNTER — Encounter: Payer: Self-pay | Admitting: Obstetrics & Gynecology

## 2016-03-13 VITALS — BP 124/62 | HR 66 | Resp 14 | Ht 63.75 in | Wt 219.0 lb

## 2016-03-13 DIAGNOSIS — Z124 Encounter for screening for malignant neoplasm of cervix: Secondary | ICD-10-CM

## 2016-03-13 DIAGNOSIS — Z9189 Other specified personal risk factors, not elsewhere classified: Secondary | ICD-10-CM

## 2016-03-13 DIAGNOSIS — Z01419 Encounter for gynecological examination (general) (routine) without abnormal findings: Secondary | ICD-10-CM | POA: Diagnosis not present

## 2016-03-13 NOTE — Progress Notes (Signed)
66 y.o. G0P0000 WidowedCaucasianF here for annual exam.  Doing really well.  Took in another dog this year.  Very active in the community.  Has finally retired from [employer].  She is so happy about this.    Denies vaginal bleeding.    Hs seen a hematologist this year due to low ferritin level.  She did have a bleeding ulcer again this year but ferritin level continues to drop.  Pt did have a laminectomy in May with Dr. Annette Stable.     On MTX due to psoriatic arthritis.  Needs me to give injection to her today.    Patient's last menstrual period was 03/02/1984.          Sexually active: No.  The current method of family planning is status post hysterectomy.    Exercising: Yes.    walking dogs Smoker:  no  Health Maintenance: Pap: 11/16/14  Negative  History of abnormal Pap:  yes MMG:  06/27/15 BIRADS 1 negative  Colonoscopy:  4/17 at [facility] BMD:   09/28/13 osteopenia  TDaP:  05/09/08  Pneumonia vaccine(s): 02/28/13, 02/28/14  Zostavax:   01/20/11  Hep C testing: 11/16/14 negative  Screening Labs: PCP, Hb today: PCP, Urine today: PCP   reports that she has never smoked. She has never used smokeless tobacco. She reports that she does not drink alcohol or use drugs.  Past Medical History:  Diagnosis Date  . Abnormal Pap smear    H/O  . Anemia    iron def  . Arthritis    rheumatoid and osteo now flt to be psoriateic   . Arthritis    psoreratic, osteoarthritis  . Broken toe   . Bruises easily   . CHOLECYSTECTOMY, HX OF   . Chronic back pain   . COLONIC POLYPS, HX OF 02/09/2007  . Cough, persistent 01/21/2012   poss from acei   . 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 Fibromyalgia    doesn't require meds  . GASTRIC ULCER, ACUTE, HEMORRHAGE, HX OF 02/09/2007  . GERD (gastroesophageal reflux disease)    takes Nexium daily  . Goiter   . H/O hiatal hernia   . Headache(784.0)    occasionally  . History of bronchitis   . Hx of seasonal allergies     takes Claritin daily  . HYPERLIPIDEMIA 12/29/2006   takes Lovastatin nightly  . HYPERTENSION 12/29/2006   takes Enalapril daily  . IBS 04/28/2007  . Insomnia    related to pain;takes Flexeril and Tylenol PM nightly  . Joint pain   . Joint swelling   . MENOPAUSAL DISORDER 12/07/2007  . Palpitations 02/09/2007  . PONV (postoperative nausea and vomiting)    NAUSEA  . REDUCTION MAMMOPLASTY, HX OF 02/09/2007  . Rheumatoid arthritis(714.0) 12/29/2006  . Right bundle branch block 02/09/2007  . S/P lumbar fusion 6 13 09/03/2011   L4 L5  posterior   . SYNCOPE 12/07/2007  . UNS ADVRS EFF OTH RX MEDICINAL\T\BIOLOGICAL SBSTNC 02/09/2007  . UTI (lower urinary tract infection)    hx of;saw urologist last yr and he told her she was over reacting and not a big deal   . WEIGHT GAIN 12/18/2008    Past Surgical History:  Procedure Laterality Date  . ABDOMINAL HYSTERECTOMY  1986   TAH/BSO  . BACK SURGERY  12+yrs ago   Synovial Cyst removal  . BACK SURGERY  6/13   lumbar L4-L5 fusion, lamenectomy  . blood transfused  6/13   transfused  2 units post op from back surgery  . bone spur  6-55yr ago   right ankle  . BREAST REDUCTION SURGERY     last 80's  . BUNIONECTOMY  10/03/14   right foot  . CERVICAL FUSION     2 12   . CHOLECYSTECTOMY  1996  . COLONOSCOPY    . ESOPHAGOGASTRODUODENOSCOPY ENDOSCOPY    . POSTERIOR CERVICAL LAMINECTOMY Left 07/30/2015   Procedure: Laminectomy and Foraminotomy - left - Cervical two -Cervical three;  Surgeon: HEarnie Larsson MD;  [facility];  Service: Neurosurgery;  Laterality: Left;  . TONSILLECTOMY     as a child    Current Outpatient Prescriptions  Medication Sig Dispense Refill  . acetaminophen (TYLENOL) 650 MG CR tablet Take 650 mg by mouth as needed for pain.    .Marland KitchenamLODipine (NORVASC) 5 MG tablet TAKE 1 TABLET (5 MG TOTAL) BY MOUTH DAILY. 90 tablet 3  . B-D ALLERGY SYRINGE 1CC/28G 28G X 1/2" 1 ML MISC USE AS DIRECTED ONCE WEEKLY  4  .  Cholecalciferol (VITAMIN D) 2000 units tablet Take 2,000 Units by mouth daily.    . clobetasol ointment (TEMOVATE) 04.62% Apply 1 application topically 2 (two) times daily.    . Diphenhydramine-APAP, sleep, (TYLENOL PM EXTRA STRENGTH PO) Take 2 tablets by mouth at bedtime as needed. For sleep    . esomeprazole (NEXIUM) 40 MG capsule Take 1 capsule (40 mg total) by mouth daily before breakfast. (Patient taking differently: Take 80 mg by mouth daily before breakfast. ) 30 capsule 11  . folic acid (FOLVITE) 1 MG tablet Take 1 tablet (1 mg total) by mouth daily. 90 tablet 4  . losartan (COZAAR) 100 MG tablet TAKE 1 TABLET (100 MG TOTAL) BY MOUTH DAILY. 90 tablet 3  . lovastatin (MEVACOR) 20 MG tablet TAKE 1 TABLET (20 MG TOTAL) BY MOUTH AT BEDTIME. 90 tablet 1  . methotrexate 50 MG/2ML injection INJECT 0.4 MLS (10 MG TOTAL) INTO THE SKIN ONCE.  0  . Multiple Vitamin (MULTIVITAMIN) tablet Take 1 tablet by mouth daily.      .Marland KitchenSIMPONI 50 MG/0.5ML SOAJ INJECT 50MG (1 SYRINGE) UNDER THE SKIN ONCE MONTHLY 0.5 mL 0  . Tuberculin-Allergy Syringes 27G X 1/2" 1 ML KIT Inject 1 Syringe into the skin once a week. If syringe size greater than 144m please call me at my office. 12 each 4   No current facility-administered medications for this visit.     Family History  Problem Relation Age of Onset  . Diabetes Mother   . Colon cancer Mother     x2  . Arthritis Mother   . Hypertension Mother   . Endometrial cancer Mother   . Stroke Father   . Heart disease Father   . Colon cancer Father   . Arthritis Brother   . Hypertension Brother   . Hyperlipidemia Brother   . Other      DISH brother  . Hypertension Maternal Grandmother   . Stroke Maternal Grandmother   . Infertility Brother   . Anesthesia problems Neg Hx   . Hypotension Neg Hx   . Malignant hyperthermia Neg Hx   . Pseudochol deficiency Neg Hx     ROS:  Pertinent items are noted in HPI.  Otherwise, a comprehensive ROS was negative.  Exam:    BP 124/62 (BP Location: Right Arm, Patient Position: Sitting, Cuff Size: Large)   Pulse 66   Resp 14   Ht 5' 3.75" (1.619  m)   Wt 219 lb (99.3 kg)   LMP 03/02/1984   BMI 37.89 kg/m    Height: 5' 3.75" (161.9 cm)  Ht Readings from Last 3 Encounters:  03/13/16 5' 3.75" (1.619 m)  02/19/16 '5\' 6"'  (1.676 m)  11/26/15 5' 3.75" (1.619 m)    General appearance: alert, cooperative and appears stated age Head: Normocephalic, without obvious abnormality, atraumatic Neck: no adenopathy, supple, symmetrical, trachea midline and thyroid normal to inspection and palpation Lungs: clear to auscultation bilaterally Breasts: normal appearance, no masses or tenderness Heart: regular rate and rhythm Abdomen: soft, non-tender; bowel sounds normal; no masses,  no organomegaly Extremities: extremities normal, atraumatic, no cyanosis or edema Skin: Skin color, texture, turgor normal. No rashes or lesions Lymph nodes: Cervical, supraclavicular, and axillary nodes normal. No abnormal inguinal nodes palpated Neurologic: Grossly normal   Pelvic: External genitalia:  no lesions              Urethra:  normal appearing urethra with no masses, tenderness or lesions              Bartholins and Skenes: normal                 Vagina: normal appearing vagina with normal color and discharge, no lesions              Cervix: absent              Pap taken: Yes.   Bimanual Exam:  Uterus:  uterus absent              Adnexa: no mass, fullness, tenderness               Rectovaginal: Confirms               Anus:  normal sphincter tone, no lesions  Methotexate 0.102m (1100m IM to fatty tissue to left upper arm.  MTX 5019mml.  Lot:  [id number] 5/19.    Chaperone was present for exam.  A:  Well Woman with normal exam  S/p TAH/BSO, off HRT H/O DES exposure  Family hx of colon cancer  H/O low ferritin and anemia after gastric ulcerations Psoriatic arthritis.  Seeing Dr. DevEstanislado PandyOn MTX injections,  monthly  P:  Mammogram yearly  pap smear today.  Done yearly due to DES exposure  Injection given as per above.  Labs and vaccinations with Dr. PanRegis Billturn annually or prn

## 2016-03-18 LAB — IPS PAP TEST WITH HPV

## 2016-03-23 ENCOUNTER — Other Ambulatory Visit: Payer: Self-pay | Admitting: Rheumatology

## 2016-03-24 NOTE — Telephone Encounter (Signed)
Last Visit: 02/19/16 Next Visit: 05/21/15 Labs: 03/05/16 Mildly elevated glucose  Okay to refill Simponi?

## 2016-03-31 ENCOUNTER — Telehealth: Payer: Self-pay | Admitting: Obstetrics & Gynecology

## 2016-03-31 MED ORDER — FLUCONAZOLE 150 MG PO TABS: 150.0000 mg | ORAL_TABLET | Freq: Once | ORAL | 0 refills | Status: AC

## 2016-03-31 NOTE — Telephone Encounter (Signed)
Patient was last seen 03/13/16 and was told that her "pap results showed yeast". Patient said she was told to call if she wanted a prescription to treat the yeast. Patient would like the prescription sent to the pharmacy on file.

## 2016-03-31 NOTE — Telephone Encounter (Signed)
Spoke with patient. Patient states that when she received her results she was not having any yeast infection symptoms. Yesterday she began having vaginal itching and burning. Requesting rx for yeast to be sent to pharmacy on file. Rx for Diflucan 150 mg take 1 tablet po now, repeat in 72 hours if symptoms persist #2 0RF sent to pharmacy on file. Patient is agreebale.  Notes Recorded by Polly Cobia, CMA on 03/20/2016 at 1:39 PM EST Pt notified. Verbalized understanding. AEX scheduled 03/15/17 ------  Notes Recorded by Megan Salon, MD on 03/20/2016 at 12:42 PM EST 02 recall. Please let pt know that pap was negative and HPV testing was negative. Her pap showed yeast. If she is having symptoms, ok to treat with Diflucan 150mg  po x 1, can repeat in 72 hours.  Routing to provider for final review. Patient agreeable to disposition. Will close encounter.

## 2016-04-14 ENCOUNTER — Telehealth: Payer: Self-pay | Admitting: Pharmacist

## 2016-04-14 NOTE — Telephone Encounter (Signed)
Patient called regarding her Simponi prescription and specialty pharmacy.  She has been using [facility].  She said she saw somewhere that [facility] was now the required pharmacy.  I advised patient I will try to find out more and call her back.

## 2016-04-15 NOTE — Telephone Encounter (Signed)
I reviewed patient's insurance plan.  The [employer] website still has [facility] listed on their Retail buyer.  I called [place] customer service to confirm that [facility] is still on the specialty pharmacy list.  I spoke to Anderson who seemed confused regarding my question.  She said that [facility] was the required pharmacy for [facility].    I called patient and updated her on this information.  Advised her that the website and customer service were conflicting.  Patient reports she is not due for her next injection until 04/30/16.  She has refills available at [facility].  She will try to call [facility] a week before her next injection is due to see if they are able to fill the prescription.  If they are no longer able to fill her Simponi, she states she will call us to have a new prescription sent to [facility].     Elisabeth Most, Pharm.D., BCPS, CPP Clinical Pharmacist Pager: [phone] Phone: [phone] 04/15/2016 10:03 AM

## 2016-04-21 ENCOUNTER — Other Ambulatory Visit: Payer: Self-pay | Admitting: Rheumatology

## 2016-04-21 NOTE — Telephone Encounter (Signed)
Last Visit: 02/19/16 Next Visit: 05/20/16 Labs: 03/05/16 Mildly elevated glucose   Okay to refill MTX?

## 2016-04-22 ENCOUNTER — Telehealth: Payer: Self-pay | Admitting: Pharmacist

## 2016-04-22 NOTE — Telephone Encounter (Signed)
Received a call from Mrs. Farney regarding her Simponi.  She said she spoke to [facility] who confirms they are able to continue to fill her Simponi, but they need a refill.  Noted a 90 day supply was sent to [facility] on 03/24/16.  I called [facility] and spoke to Guadalupe Guerra.  She confirmed they did receive the prescription, and will process the prescription for the patient.  I updated Mrs. Bahler who denied any further questions.     Elisabeth Most, Pharm.D., BCPS, CPP Clinical Pharmacist Pager: [phone] Phone: [phone] 04/22/2016 9:05 AM

## 2016-05-07 ENCOUNTER — Other Ambulatory Visit (HOSPITAL_BASED_OUTPATIENT_CLINIC_OR_DEPARTMENT_OTHER): Payer: BLUE CROSS/BLUE SHIELD

## 2016-05-07 DIAGNOSIS — D509 Iron deficiency anemia, unspecified: Secondary | ICD-10-CM | POA: Diagnosis not present

## 2016-05-07 DIAGNOSIS — D500 Iron deficiency anemia secondary to blood loss (chronic): Secondary | ICD-10-CM

## 2016-05-07 LAB — CBC & DIFF AND RETIC
BASO%: 0.5 % (ref 0.0–2.0)
Basophils Absolute: 0 10*3/uL (ref 0.0–0.1)
EOS%: 0.7 % (ref 0.0–7.0)
Eosinophils Absolute: 0 10*3/uL (ref 0.0–0.5)
HCT: 39.3 % (ref 34.8–46.6)
HGB: 13.7 g/dL (ref 11.6–15.9)
Immature Retic Fract: 13.6 % — ABNORMAL HIGH (ref 1.60–10.00)
LYMPH%: 29.5 % (ref 14.0–49.7)
MCH: 32.4 pg (ref 25.1–34.0)
MCHC: 34.9 g/dL (ref 31.5–36.0)
MCV: 92.9 fL (ref 79.5–101.0)
MONO#: 0.3 10*3/uL (ref 0.1–0.9)
MONO%: 4.4 % (ref 0.0–14.0)
NEUT#: 3.9 10*3/uL (ref 1.5–6.5)
NEUT%: 64.9 % (ref 38.4–76.8)
Platelets: 291 10*3/uL (ref 145–400)
RBC: 4.23 10*6/uL (ref 3.70–5.45)
RDW: 13.8 % (ref 11.2–14.5)
Retic %: 2.48 % — ABNORMAL HIGH (ref 0.70–2.10)
Retic Ct Abs: 104.9 10*3/uL — ABNORMAL HIGH (ref 33.70–90.70)
WBC: 5.9 10*3/uL (ref 3.9–10.3)
lymph#: 1.8 10*3/uL (ref 0.9–3.3)

## 2016-05-07 LAB — COMPREHENSIVE METABOLIC PANEL
ALT: 21 U/L (ref 0–55)
AST: 18 U/L (ref 5–34)
Albumin: 4.2 g/dL (ref 3.5–5.0)
Alkaline Phosphatase: 107 U/L (ref 40–150)
Anion Gap: 10 mEq/L (ref 3–11)
BUN: 20.4 mg/dL (ref 7.0–26.0)
CO2: 26 mEq/L (ref 22–29)
Calcium: 9.8 mg/dL (ref 8.4–10.4)
Chloride: 105 mEq/L (ref 98–109)
Creatinine: 0.8 mg/dL (ref 0.6–1.1)
EGFR: 75 mL/min/{1.73_m2} — ABNORMAL LOW (ref >90)
Glucose: 135 mg/dl (ref 70–140)
Potassium: 4.1 mEq/L (ref 3.5–5.1)
Sodium: 141 mEq/L (ref 136–145)
Total Bilirubin: 0.63 mg/dL (ref 0.20–1.20)
Total Protein: 7.9 g/dL (ref 6.4–8.3)

## 2016-05-07 LAB — FERRITIN: Ferritin: 148 ng/ml (ref 9–269)

## 2016-05-13 NOTE — Progress Notes (Signed)
Office Visit Note  Patient: Jade Cardenas             Date of Birth: 12-30-1950           MRN: [id number]             PCP: Lottie Dawson, MD Referring: [name], MD Visit Date: 05/20/2016 Occupation: _0 @    Subjective:  Pain of the Left Knee and Fatigue (is sch for iron infusions/ also ? if fatigue may be from MTX )   History of Present Illness: Jade Cardenas is a 66 y.o. female   Follow-up on psoriasis and psoriatic arthritis.  Patient was seen in December 2017 at which time she was flaring and not doing well with monotherapy of SIMPONI.  At that time, we added methotrexate (low-dose) to see if the dual therapy would help. We did the low dose because patient had a history of GI upset when she was taking oral methotrexate long time ago.  Today patient states that she is doing well with the dual therapy. She is tolerating the methotrexate very well and her joints have improved. She does have lingering psoriasis that is not too troublesome but patient would like to have that resolved as well. He discussed the option of going to a higher dose of methotrexate and she is agreeable.  Her main complaint today in addition to follow-up on psoriasis and psoriatic arthritis is her knees are hurting. Her left knee is more painful than the right knee. No falls or injuries. She's having stiffness and difficulty with ambulation at times. After a few steps, she is able to ambulate well without pain and discomfort.  Note that patient has a history of anemia. She sees Dr. Irene Limbo, hematologist for the anemia. Her ferritin is low and they're planning to do infusion for her.   Seeing dr. Irene Limbo, hematologist, for anemia. He suggested iron infusions regularlly. Ferritin level has been low. Infusions at a certain schedule.  Right back pain with radiation down the right back Occurs when awakening Complaining of back muscle  spasms   Activities of Daily Living:    Patient reports morning stiffness for 30 minutes.   Patient Reports nocturnal pain.  Difficulty dressing/grooming: Denies Difficulty climbing stairs: Reports Difficulty getting out of chair: Reports Difficulty using hands for taps, buttons, cutlery, and/or writing: Reports   Review of Systems  Constitutional: Positive for fatigue.  HENT: Negative for mouth sores and mouth dryness.   Eyes: Negative for dryness.  Respiratory: Negative for shortness of breath.   Gastrointestinal: Negative for constipation and diarrhea.  Musculoskeletal: Positive for myalgias and myalgias.  Skin: Negative for sensitivity to sunlight.  Psychiatric/Behavioral: Positive for sleep disturbance. Negative for decreased concentration.    PMFS History:  Patient Active Problem List   Diagnosis Date Noted  . 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 Fibromyalgia 05/17/2016  . Other psoriasis 02/18/2016  . High risk medication use 02/18/2016  . DJD (degenerative joint disease), cervical 02/18/2016  . Heel spur 09/29/2015  . Ingrown toenail 09/29/2015  . Iron deficiency anemia due to chronic blood loss 08/14/2015  . Foraminal stenosis of cervical region 07/30/2015  . Microcytic hypochromic anemia 05/22/2015  . Tailor's bunion, left 01/18/2015  . H/O hiatal hernia   . Irritable bowel syndrome (IBS) 03/01/2014  . Primary osteoarthritis involving multiple joints 12/15/2013  . Iron deficiency 12/15/2013  . Psoriatic arthritis ([place]) 12/15/2013  . History of foot fracture 12/15/2013  . Dermatitis 10/17/2013  . DES exposure in  utero, unknown   . Arthritis 05/31/2013  . 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 Skin lesion 12/09/2012  . Tinnitus 06/23/2012  . Scalp lesion 01/21/2012  . Headache(784.0) 01/21/2012  . Hx of psoriasis 01/21/2012  . Weight gain 01/21/2012  . Ankylosing spondylitis  possible 09/03/2011  . Hx of transfusion 09/03/2011  . S/P lumbar fusion 6 13 09/03/2011  .  Lumbar stenosis with neurogenic claudication 08/05/2011  . LBP (low back pain) 01/17/2011  . ALLERGIC RHINITIS 02/19/2010  . GERD 02/19/2010  . ARTHRITIS 02/19/2010  . SPINAL STENOSIS, CERVICAL 02/19/2010  . OSTEOPENIA 02/19/2010  . WEIGHT GAIN 12/18/2008  . RASH AND OTHER NONSPECIFIC SKIN ERUPTION 12/07/2007  . IBS 04/28/2007  . PALPITATIONS 02/09/2007  . History of gastric ulcer 02/09/2007  . COLONIC POLYPS, HX OF 02/09/2007  . REDUCTION MAMMOPLASTY, HX OF 02/09/2007  . CHOLECYSTECTOMY, HX OF 02/09/2007  . Hyperlipidemia 12/29/2006  . Essential hypertension 12/29/2006    Past Medical History:  Diagnosis Date  . Abnormal Pap smear    H/O  . Anemia    iron def  . Arthritis    rheumatoid and osteo now flt to be psoriateic   . Arthritis    psoreratic, osteoarthritis  . Broken toe   . Bruises easily   . CHOLECYSTECTOMY, HX OF   . Chronic back pain   . COLONIC POLYPS, HX OF 02/09/2007  . Cough, persistent 01/21/2012   poss from acei   . 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 Fibromyalgia    doesn't require meds  . GASTRIC ULCER, ACUTE, HEMORRHAGE, HX OF 02/09/2007  . GERD (gastroesophageal reflux disease)    takes Nexium daily  . Goiter   . H/O hiatal hernia   . Headache(784.0)    occasionally  . History of bronchitis   . Hx of seasonal allergies    takes Claritin daily  . HYPERLIPIDEMIA 12/29/2006   takes Lovastatin nightly  . HYPERTENSION 12/29/2006   takes Enalapril daily  . IBS 04/28/2007  . Insomnia    related to pain;takes Flexeril and Tylenol PM nightly  . Joint pain   . Joint swelling   . MENOPAUSAL DISORDER 12/07/2007  . Palpitations 02/09/2007  . PONV (postoperative nausea and vomiting)    NAUSEA  . REDUCTION MAMMOPLASTY, HX OF 02/09/2007  . Rheumatoid arthritis(714.0) 12/29/2006  . Right bundle branch block 02/09/2007  . S/P lumbar fusion 6 13 09/03/2011   L4 L5  posterior   . SYNCOPE 12/07/2007  . UNS ADVRS EFF OTH RX MEDICINAL\T\BIOLOGICAL SBSTNC  02/09/2007  . UTI (lower urinary tract infection)    hx of;saw urologist last yr and he told her she was over reacting and not a big deal   . WEIGHT GAIN 12/18/2008    Family History  Problem Relation Age of Onset  . Diabetes Mother   . Colon cancer Mother     x2  . Arthritis Mother   . Hypertension Mother   . Endometrial cancer Mother   . Stroke Father   . Heart disease Father   . Colon cancer Father   . Arthritis Brother   . Hypertension Brother   . Hyperlipidemia Brother   . Other      DISH brother  . Hypertension Maternal Grandmother   . Stroke Maternal Grandmother   . Infertility Brother   . Anesthesia problems Neg Hx   . Hypotension Neg Hx   . Malignant hyperthermia Neg Hx   . Pseudochol  deficiency Neg Hx    Past Surgical History:  Procedure Laterality Date  . ABDOMINAL HYSTERECTOMY  1986   TAH/BSO  . BACK SURGERY  12+yrs ago   Synovial Cyst removal  . BACK SURGERY  6/13   lumbar L4-L5 fusion, lamenectomy  . blood transfused  6/13   transfused 2 units post op from back surgery  . bone spur  6-43yr ago   right ankle  . BREAST REDUCTION SURGERY     last 80's  . BUNIONECTOMY  10/03/14   right foot  . CERVICAL FUSION     2 12   . CHOLECYSTECTOMY  1996  . COLONOSCOPY    . ESOPHAGOGASTRODUODENOSCOPY ENDOSCOPY    . POSTERIOR CERVICAL LAMINECTOMY Left 07/30/2015   Procedure: Laminectomy and Foraminotomy - left - Cervical two -Cervical three;  Surgeon: HEarnie Larsson MD;  [facility];  Service: Neurosurgery;  Laterality: Left;  . TONSILLECTOMY     as a child   Social History   Social History Narrative   Works f[employer] working less hours on retirement track   Widow  Husband died suddenly 2Feb 17, 2010  Non smoker     Objective: Vital Signs: BP 128/74   Pulse 78   Resp 14   Ht _0  (1.626 m)   Wt 224 lb (101.6 kg)   LMP 03/02/1984   BMI 38.45 kg/m    Physical Exam  Constitutional: She is oriented to person, place,  and time. She appears well-developed and well-nourished.  HENT:  Head: Normocephalic and atraumatic.  Eyes: EOM are normal. Pupils are equal, round, and reactive to light.  Cardiovascular: Normal rate, regular rhythm and normal heart sounds.  Exam reveals no gallop and no friction rub.   No murmur heard. Pulmonary/Chest: Effort normal and breath sounds normal. She has no wheezes. She has no rales.  Abdominal: Soft. Bowel sounds are normal. She exhibits no distension. There is no tenderness. There is no guarding. No hernia.  Musculoskeletal: Normal range of motion. She exhibits no edema, tenderness or deformity.  Lymphadenopathy:    She has no cervical adenopathy.  Neurological: She is alert and oriented to person, place, and time. Coordination normal.  Skin: Skin is warm and dry. Capillary refill takes less than 2 seconds. No rash noted.  Psychiatric: She has a normal mood and affect. Her behavior is normal.  Nursing note and vitals reviewed.    Musculoskeletal Exam:  Full range of motion of all joints Grip strength is equal and strong bilaterally Fiber myalgia tender points are all absent  CDAI Exam: CDAI Homunculus Exam:   Tenderness:  LLE: tibiofemoral  Swelling:  LLE: tibiofemoral  Joint Counts:  CDAI Tender Joint count: 1 CDAI Swollen Joint count: 1  Global Assessments:  Patient Global Assessment: 2 Provider Global Assessment: 2  CDAI Calculated Score: 6  Patient's joints are doing very well except for bilateral knees are having some pain.   Investigation: Findings:  PPD negative 11/29/2015   Appointment on 05/07/2016  Component Date Value Ref Range Status  . WBC 05/07/2016 5.9  3.9 - 10.3 10e3/uL Final  . NEUT# 05/07/2016 3.9  1.5 - 6.5 10e3/uL Final  . HGB 05/07/2016 13.7  11.6 - 15.9 g/dL Final  . HCT 05/07/2016 39.3  34.8 - 46.6 % Final  . Platelets 05/07/2016 291  145 - 400 10e3/uL Final  . MCV 05/07/2016 92.9  79.5 - 101.0 fL Final  . MCH 05/07/2016  32.4  25.1 - 34.0 pg Final  . MCHC 05/07/2016 34.9  31.5 - 36.0 g/dL Final  . RBC 05/07/2016 4.23  3.70 - 5.45 10e6/uL Final  . RDW 05/07/2016 13.8  11.2 - 14.5 % Final  . lymph# 05/07/2016 1.8  0.9 - 3.3 10e3/uL Final  . MONO# 05/07/2016 0.3  0.1 - 0.9 10e3/uL Final  . Eosinophils Absolute 05/07/2016 0.0  0.0 - 0.5 10e3/uL Final  . Basophils Absolute 05/07/2016 0.0  0.0 - 0.1 10e3/uL Final  . NEUT% 05/07/2016 64.9  38.4 - 76.8 % Final  . LYMPH% 05/07/2016 29.5  14.0 - 49.7 % Final  . MONO% 05/07/2016 4.4  0.0 - 14.0 % Final  . EOS% 05/07/2016 0.7  0.0 - 7.0 % Final  . BASO% 05/07/2016 0.5  0.0 - 2.0 % Final  . Retic % 05/07/2016 2.48* 0.70 - 2.10 % Final  . Retic Ct Abs 05/07/2016 104.90* 33.70 - 90.70 10e3/uL Final  . Immature Retic Fract 05/07/2016 13.60* 1.60 - 10.00 % Final  . Sodium 05/07/2016 141  136 - 145 mEq/L Final  . Potassium 05/07/2016 4.1  3.5 - 5.1 mEq/L Final  . Chloride 05/07/2016 105  98 - 109 mEq/L Final  . CO2 05/07/2016 26  22 - 29 mEq/L Final  . Glucose 05/07/2016 135  70 - 140 mg/dl Final  . BUN 05/07/2016 20.4  7.0 - 26.0 mg/dL Final  . Creatinine 05/07/2016 0.8  0.6 - 1.1 mg/dL Final  . Total Bilirubin 05/07/2016 0.63  0.20 - 1.20 mg/dL Final  . Alkaline Phosphatase 05/07/2016 107  40 - 150 U/L Final  . AST 05/07/2016 18  5 - 34 U/L Final  . ALT 05/07/2016 21  0 - 55 U/L Final  . Total Protein 05/07/2016 7.9  6.4 - 8.3 g/dL Final  . Albumin 05/07/2016 4.2  3.5 - 5.0 g/dL Final  . Calcium 05/07/2016 9.8  8.4 - 10.4 mg/dL Final  . Anion Gap 05/07/2016 10  3 - 11 mEq/L Final  . EGFR 05/07/2016 75* >90 ml/min/1.73 m2 Final  . Ferritin 05/07/2016 148  9 - 269 ng/ml Final      Imaging: Xr Foot 2 Views Left  Result Date: 05/20/2016 X-ray 2 views of the left foot shows #1: DIP PIP joint space narrowing. #2: Mild MTP joint space narrowing #3: Mild calcaneal spurring #4: Fifth phalanx with intact hardware (screw) Impression: #1 mild/moderate OA of the left  foot  Xr Foot 2 Views Right  Result Date: 05/20/2016 X-ray 2 views of the right foot shows #1: DIP PIP joint space narrowing. #2: Mild MTP joint space narrowing #3: Mild calcaneal spurring #4: Fifth phalanx with intact hardware (screw) Impression: #1 mild/moderate OA of the right foot  Xr Hand 2 View Left  Result Date: 05/20/2016 X-ray of the left hand shows some DIP PIP joint space narrowing that's moderate in nature. No erosions noted. #2: Some MCP joint space narrowing. No erosions. Intracarpal joint space is well preserved. Ulnar styloid and radiocarpal joint space is preserved except some narrowing of the radial carpal joint. No erosions. Impression: No erosions noted. Moderate osteoarthritis of the left hand.  Xr Hand 2 View Right  Result Date: 05/20/2016 X-ray of the right hand shows some DIP PIP joint space narrowing that's moderate in nature. No erosions noted. #2: Some MCP joint space narrowing. No erosions. Intracarpal joint space is well preserved. Ulnar styloid and radiocarpal joint space is preserved except some narrowing of the radial carpal joint. No erosions. Impression:  No erosions noted. Moderate osteoarthritis of the right hand.   Xr Knee 3 View Left  Result Date: 05/20/2016 X-ray of the right knee 2 views #1: Medial compartment narrowing that is moderate to severe; with some lateral compartment narrowing moderate. No CPPD noted. Proper alignment of the femur and the tibia. No osteophytes noted. Impression: Moderate medial compartment of the left knee with mild narrowing of lateral compartment .   Xr Knee 3 View Right  Result Date: 05/20/2016 X-ray of the right knee 2 views #1: Medial compartment narrowing that is moderate to severe; with some lateral compartment narrowing moderate. No CPPD noted. Proper alignment of the femur and the tibia. No osteophytes noted. Impression: Moderate medial compartment of the right knee with mild narrowing of lateral compartment  .   Speciality Comments: No specialty comments available.    Procedures:  Large Joint Inj Date/Time: 05/20/2016 12:04 PM Performed by: Eliezer Lofts Authorized by: Eliezer Lofts   Consent Given by:  Patient Site marked: the procedure site was marked   Timeout: prior to procedure the correct patient, procedure, and site was verified   Indications:  Pain and joint swelling Location:  Knee Site:  L knee Prep: patient was prepped and draped in usual sterile fashion   Needle Size:  27 G Needle Length:  1.5 inches Approach:  Medial Ultrasound Guidance: No   Fluoroscopic Guidance: No   Arthrogram: No   Medications:  1.5 mL lidocaine 1 %; 40 mg triamcinolone acetonide 40 MG/ML Aspiration Attempted: Yes   Patient tolerance:  Patient tolerated the procedure well with no immediate complications  Left knee injected with 40 mg of Kenalog mixed with one half mL's 1% lidocaine. Patient tolerated procedure well.   Allergies: Erythromycin; Iodinated diagnostic agents; Ace inhibitors; Dilaudid [hydromorphone hcl]; Morphine and related; Keflex [cephalexin]; and Macrobid [nitrofurantoin]   Assessment / Plan:     Visit Diagnoses: Psoriatic arthritis ([place]) - 05/20/2016: no joint pain, swelling or stiffness --> adeq response with Simponi & MTX 73m since dec 2017; - Plan: XR KNEE 3 VIEW RIGHT, XR KNEE 3 VIEW LEFT, XR Foot 2 Views Right, XR Foot 2 Views Left, XR Hand 2 View Right, XR Hand 2 View Left  Psoriasis - 05/20/2016 mild systemic psoriasis noted  High risk medication use - on Simponi SubQ monthly;  MTX 10 mg sq, Folic acid // ([place]) - Plan: CBC with Differential/Platelet, COMPLETE METABOLIC PANEL WITH GFR  History of foot fracture - 05/20/2016, both fifth phalanx have been fractured and they both have screws noted on today's x-ray. They're intact and doing well  Fibromyalgia  Chronic fatigue - Plan: VITAMIN D 25 Hydroxy (Vit-D Deficiency, Fractures)  DJD  (degenerative joint disease), cervical - s/p fusion  DDD Lumbar spine - Status post fusion  Iron deficiency - 05/20/2016: Patient is getting anemia/iron deficiency addressed by Dr. KIrene Limbo hematologist  Acute bilateral knee pain - Plan: XR KNEE 3 VIEW RIGHT, XR KNEE 3 VIEW LEFT  Bilateral hand pain - Plan: XR Hand 2 View Right, XR Hand 2 View Left  Pain in both feet - Plan: XR Foot 2 Views Right, XR Foot 2 Views Left  Her other medical problems include history of right bundle branch block, hypercholesterolemia history of cholecystectomy.  PLAN:  #1: PsA.  Doing well w/ dual therapy.  #2: Ps.  Some flare  #3: Left knee pain greater than right knee pain. Please see procedure note for full details regarding cortisone injection in the  left knee. We also have failed and states, weight loss, and x-rays are proven that she has osteoarthritis. Once she feels cortisone injection, we can do Visco supplementation. We've discussed this in great detail and patient is agreeable to move forward with that in the future. I [facility] to [facility] regarding applying for Visco supplementation for this patient's knees. Euflexxa is preferred for this patient but Orthovisc or Hyalgan is also acceptable. I would like to do both knees   #4: CBC with differential CMP with GFR and vitamin D 25 OH in 3 months (June 2018).  #5: Patient has a history of anemia which is being addressed by the hematologist, Dr. Irene Limbo. They're planning on doing infusions of iron.  #6: We will increase the patient's methotrexate to 0.5 mL's per week to see if we can better manage her systemic psoriasis (although mild).  #7: Return to clinic in 5 months.   Orders: Orders Placed This Encounter  Procedures  . XR KNEE 3 VIEW RIGHT  . XR KNEE 3 VIEW LEFT  . XR Foot 2 Views Right  . XR Foot 2 Views Left  . XR Hand 2 View Right  . XR Hand 2 View Left  . VITAMIN D 25 Hydroxy (Vit-D Deficiency, Fractures)  . CBC with  Differential/Platelet  . COMPLETE METABOLIC PANEL WITH GFR   Meds ordered this encounter  Medications  . clobetasol ointment (TEMOVATE) 0.05 %    Sig: Apply 1 application topically 2 (two) times daily.    Dispense:  45 g    Refill:  1      Face-to-face time spent with patient was 30 minutes. 50% of time was spent in counseling and coordination of care.  Follow-Up Instructions: Return in about 5 months (around 10/20/2016).   Eliezer Lofts, PA-C  Patient had no synovitis on examination. She had left knee joint warmth and swelling. I examined and evaluated the patient with Eliezer Lofts PA. The plan of care was discussed as noted above.  Bo Merino, MD Note - This record has been created using Editor, commissioning.  Chart creation errors have been sought, but may not always  have been located. Such creation errors do not reflect on  the standard of medical care.

## 2016-05-14 ENCOUNTER — Telehealth: Payer: Self-pay | Admitting: Hematology

## 2016-05-14 ENCOUNTER — Other Ambulatory Visit: Payer: BLUE CROSS/BLUE SHIELD

## 2016-05-14 ENCOUNTER — Encounter: Payer: Self-pay | Admitting: Hematology

## 2016-05-14 ENCOUNTER — Ambulatory Visit (HOSPITAL_BASED_OUTPATIENT_CLINIC_OR_DEPARTMENT_OTHER): Payer: BLUE CROSS/BLUE SHIELD | Admitting: Hematology

## 2016-05-14 VITALS — BP 148/67 | HR 81 | Temp 97.7°F | Resp 18 | Wt 221.5 lb

## 2016-05-14 DIAGNOSIS — E538 Deficiency of other specified B group vitamins: Secondary | ICD-10-CM | POA: Diagnosis not present

## 2016-05-14 DIAGNOSIS — E611 Iron deficiency: Secondary | ICD-10-CM | POA: Diagnosis not present

## 2016-05-14 DIAGNOSIS — D509 Iron deficiency anemia, unspecified: Secondary | ICD-10-CM | POA: Diagnosis not present

## 2016-05-14 DIAGNOSIS — D500 Iron deficiency anemia secondary to blood loss (chronic): Secondary | ICD-10-CM

## 2016-05-14 NOTE — Telephone Encounter (Signed)
Gave patient AVS and calender per 05/14/2016 los 

## 2016-05-17 DIAGNOSIS — M479 Spondylosis, unspecified: Secondary | ICD-10-CM | POA: Insufficient documentation

## 2016-05-17 DIAGNOSIS — M797 Fibromyalgia: Secondary | ICD-10-CM | POA: Insufficient documentation

## 2016-05-18 NOTE — Progress Notes (Signed)
Jade Cardenas    HEMATOLOGY/ONCOLOGY CLINIC NOTE  Date of Service: .05/14/2016  Patient Care Team: Jade Medin, MD as PCP - General Jade Campbell, MD (Gastroenterology) Jade Salon, MD (Gynecology) Jade Hector, MD (Cardiology) Jade Merino, MD as Consulting Physician (Rheumatology) Jade Genera, MD as Consulting Physician (Hematology)  CHIEF COMPLAINTS/PURPOSE OF CONSULTATION: f/u for Iron deficiency anemia  Diagnosis severe iron deficiency anemia + ACD (due to psoariasis) Oral iron intolerance  Treatment IV Iron as needed (received IV feraheme 08/29/15, 08/22/2015, 06/04/2015, 05/28/2015)   INTERVAL HISTORY  Jade Cardenas is here for her scheduled follow-up for iron deficiency anemia. She notes her energy levels have declined. She notes that she is on Symponi + MTX for her psoariatic arthritis as per her rheumatologist.   Reports no overt GI bleeding or melena. No abdominal pain.  Notes that her last EGD with Dr Jade Cardenas showed that her GU had healed.  MEDICAL HISTORY:  Past Medical History:  Diagnosis Date  . Abnormal Pap smear    H/O  . Anemia    iron def  . Arthritis    rheumatoid and osteo now flt to be psoriateic   . Arthritis    psoreratic, osteoarthritis  . Broken toe   . Bruises easily   . CHOLECYSTECTOMY, HX OF   . Chronic back pain   . COLONIC POLYPS, HX OF 02/09/2007  . Cough, persistent 01/21/2012   poss from acei   . 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 Fibromyalgia    doesn't require meds  . GASTRIC ULCER, ACUTE, HEMORRHAGE, HX OF 02/09/2007  . GERD (gastroesophageal reflux disease)    takes Nexium daily  . Goiter   . H/O hiatal hernia   . Headache(784.0)    occasionally  . History of bronchitis   . Hx of seasonal allergies    takes Claritin daily  . HYPERLIPIDEMIA 12/29/2006   takes Lovastatin nightly  . HYPERTENSION 12/29/2006   takes Enalapril daily  . IBS 04/28/2007  . Insomnia    related to pain;takes Flexeril and Tylenol PM  nightly  . Joint pain   . Joint swelling   . MENOPAUSAL DISORDER 12/07/2007  . Palpitations 02/09/2007  . PONV (postoperative nausea and vomiting)    NAUSEA  . REDUCTION MAMMOPLASTY, HX OF 02/09/2007  . Rheumatoid arthritis(714.0) 12/29/2006  . Right bundle branch block 02/09/2007  . S/P lumbar fusion 6 13 09/03/2011   L4 L5  posterior   . SYNCOPE 12/07/2007  . UNS ADVRS EFF OTH RX MEDICINAL\T\BIOLOGICAL SBSTNC 02/09/2007  . UTI (lower urinary tract infection)    hx of;saw urologist last yr and he told her she was over reacting and not a big deal   . WEIGHT GAIN 12/18/2008    SURGICAL HISTORY: Past Surgical History:  Procedure Laterality Date  . ABDOMINAL HYSTERECTOMY  1986   TAH/BSO  . BACK SURGERY  12+yrs ago   Synovial Cyst removal  . BACK SURGERY  6/13   lumbar L4-L5 fusion, lamenectomy  . blood transfused  6/13   transfused 2 units post op from back surgery  . bone spur  6-26yr ago   right ankle  . BREAST REDUCTION SURGERY     last 80's  . BUNIONECTOMY  10/03/14   right foot  . CERVICAL FUSION     2 12   . CHOLECYSTECTOMY  1996  . COLONOSCOPY    . ESOPHAGOGASTRODUODENOSCOPY ENDOSCOPY    . POSTERIOR CERVICAL LAMINECTOMY Left 07/30/2015   Procedure: Laminectomy  and Foraminotomy - left - Cervical two -Cervical three;  Surgeon: Jade Larsson, MD;  Location: [facility];  Service: Neurosurgery;  Laterality: Left;  . TONSILLECTOMY     as a child    SOCIAL HISTORY: Social History   Social History  . Marital status: Widowed    Spouse name: N/A  . Number of children: N/A  . Years of education: N/A   Occupational History  . Not on file.   Social History Main Topics  . Smoking status: Never Smoker  . Smokeless tobacco: Never Used  . Alcohol use No  . Drug use: No  . Sexual activity: No     Comment: TAH/BSO   Other Topics Concern  . 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 Diabetes Mother   . Colon cancer Mother     x2  . Arthritis Mother   . Hypertension Mother   . Endometrial cancer Mother   . Stroke Father   . Heart disease Father   . Colon cancer Father   . Arthritis Brother   . Hypertension Brother   . Hyperlipidemia Brother   . Other      DISH brother  . Hypertension Maternal Grandmother   . Stroke Maternal Grandmother   . Infertility Brother   . Anesthesia problems Neg Hx   . Hypotension Neg Hx   . Malignant hyperthermia Neg Hx   . Pseudochol deficiency Neg Hx     ALLERGIES:  is allergic to erythromycin; iodinated diagnostic agents; ace inhibitors; dilaudid [hydromorphone hcl]; morphine and related; keflex [cephalexin]; and macrobid [nitrofurantoin].  MEDICATIONS:  Current Outpatient Prescriptions  Medication Sig Dispense Refill  . acetaminophen (TYLENOL) 650 MG CR tablet Take 650 mg by mouth as needed for pain.    Jade Cardenas amLODipine (NORVASC) 5 MG tablet TAKE 1 TABLET (5 MG TOTAL) BY MOUTH DAILY. 90 tablet 3  . B-D ALLERGY SYRINGE 1CC/28G 28G X 1/2" 1 ML MISC USE AS DIRECTED ONCE WEEKLY  4  . Cholecalciferol (VITAMIN D) 2000 units tablet Take 2,000 Units by mouth daily.    . clobetasol ointment (TEMOVATE) 2.09 % Apply 1 application topically 2 (two) times daily.    . Diphenhydramine-APAP, sleep, (TYLENOL PM EXTRA STRENGTH PO) Take 2 tablets by mouth at bedtime as needed. For sleep    . esomeprazole (NEXIUM) 40 MG capsule Take 1 capsule (40 mg total) by mouth daily before breakfast. (Patient taking differently: Take 80 mg by mouth daily before breakfast. ) 30 capsule 11  . folic acid (FOLVITE) 1 MG tablet Take 1 tablet (1 mg total) by mouth daily. 90 tablet 4  . losartan (COZAAR) 100 MG tablet TAKE 1 TABLET (100 MG TOTAL) BY MOUTH DAILY. 90 tablet 3  . lovastatin (MEVACOR) 20 MG tablet TAKE 1 TABLET (20 MG TOTAL) BY MOUTH AT  BEDTIME. 90 tablet 1  . methotrexate 50 MG/2ML injection INJECT 0.4 MLS (10 MG TOTAL) INTO THE SKIN ONCE.  0  . Multiple Vitamin (MULTIVITAMIN) tablet Take 1 tablet by mouth daily.      Jade Cardenas SIMPONI 50 MG/0.5ML SOAJ INJECT 50MG (1 SYRINGE) UNDER THE SKIN ONCE MONTHLY 3 Syringe 0  . Tuberculin-Allergy Syringes 27G X 1/2" 1 ML KIT Inject 1 Syringe into the  skin once a week. If syringe size greater than 45m, please call me at my office. 12 each 4   No current facility-administered medications for this visit.     REVIEW OF SYSTEMS:    10 Point review of Systems was done is negative except as noted above.  PHYSICAL EXAMINATION: ECOG PERFORMANCE STATUS: 2 - Symptomatic, <50% confined to bed  . Vitals:   05/14/16 1251  BP: (!) 148/67  Pulse: 81  Resp: 18  Temp: 97.7 F (36.5 C)   Filed Weights   05/14/16 1251  Weight: 221 lb 8 oz (100.5 kg)   .Body mass index is 38.32 kg/m.  GENERAL:alert, in no acute distress and comfortabl SKIN: skin color, texture, turgor are normal, no rashes or significant lesions EYES: mild conjunctival pallor no scleral icterus OROPHARYNX:no exudate, no erythema and lips, buccal mucosa, and tongue normal  NECK: supple, no JVD, thyroid normal size, non-tender, without nodularity LYMPH:  no palpable lymphadenopathy in the cervical, axillary or inguinal LUNGS: clear to auscultation with normal respiratory effort HEART: regular rate & rhythm,  no murmurs and no lower extremity edema ABDOMEN: abdomen soft, non-tender, normoactive bowel sounds  Musculoskeletal: no cyanosis of digits and no clubbing  PSYCH: alert & oriented x 3 with fluent speech NEURO: no focal motor/sensory deficits  LABORATORY DATA:  I have reviewed the data as listed  . CBC Latest Ref Rng & Units 05/07/2016 03/05/2016 02/19/2016  WBC 3.9 - 10.3 10e3/uL 5.9 6.7 5.9  Hemoglobin 11.6 - 15.9 g/dL 13.7 14.0 14.1  Hematocrit 34.8 - 46.6 % 39.3 41.5 41.2  Platelets 145 - 400 10e3/uL 291 359 319    . CBC    Component Value Date/Time   WBC 5.9 05/07/2016 1326   WBC 6.7 03/05/2016 0930   RBC 4.23 05/07/2016 1326   RBC 4.40 03/05/2016 0930   HGB 13.7 05/07/2016 1326   HCT 39.3 05/07/2016 1326   PLT 291 05/07/2016 1326   MCV 92.9 05/07/2016 1326   MCH 32.4 05/07/2016 1326   MCH 31.8 03/05/2016 0930   MCHC 34.9 05/07/2016 1326   MCHC 33.7 03/05/2016 0930   RDW 13.8 05/07/2016 1326   LYMPHSABS 1.8 05/07/2016 1326   MONOABS 0.3 05/07/2016 1326   EOSABS 0.0 05/07/2016 1326   BASOSABS 0.0 05/07/2016 1326    . Lab Results  Component Value Date   RETICCTPCT 2.48 (H) 05/07/2016   RBC 4.23 05/07/2016   RETICCTABS 104.90 (H) 05/07/2016    . CMP Latest Ref Rng & Units 05/07/2016 03/05/2016 02/19/2016  Glucose 70 - 140 mg/dl 135 107(H) 85  BUN 7.0 - 26.0 mg/dL 20.'4 19 16  ' Creatinine 0.6 - 1.1 mg/dL 0.8 0.78 0.73  Sodium 136 - 145 mEq/L 141 142 140  Potassium 3.5 - 5.1 mEq/L 4.1 4.6 4.9  Chloride 98 - 110 mmol/L - 104 103  CO2 22 - 29 mEq/L '26 25 25  ' Calcium 8.4 - 10.4 mg/dL 9.8 9.5 9.7  Total Protein 6.4 - 8.3 g/dL 7.9 7.3 7.5  Total Bilirubin 0.20 - 1.20 mg/dL 0.63 0.7 0.6  Alkaline Phos 40 - 150 U/L 107 100 97  AST 5 - 34 U/L '18 22 21  ' ALT 0 - 55 U/L '21 21 24    ' Lab Results  Component Value Date   FERRITIN 148 05/07/2016    RADIOGRAPHIC STUDIES: I have personally reviewed the radiological images as listed and agreed with the findings in the report. No results found.  ASSESSMENT & PLAN:   Jade.  Fugett is a pleasant 66 year old female with  #1 Severe microcytic hypochromic anemia  Due to iron deficiency. Patient's hemoglobin has responded very well to IV iron with improvement in her hemoglobin from 7.2 to 12.4 and now  13.7 with a normal MCV . Ferritin levels are down from 268 to 148 ( level are inappropriately over estimated in the setting of significant inflammation)  #2 severe iron deficiency likely due to chronic GI bleeding + poor oral iron  absorption. She has a EGD and colonoscopy in 2013.  Previous history of gastric ulcer requiring blood transfusions.  Also send gastric polyps and a large hernia and prone to developing Cameron ulcers, previous colonic polyps and previous bleeding hemorrhoids.  Patient reports having recent EGD and colonoscopy with Dr Jade Cardenas in April 2017 which showed a large gastric ulcer. She reports that it was noted to be benign and Helicobacter pylori negative. The reports of the studies and the pathology are not available to [place] at this time.  Patient notes that she has had f/u EGD which showed that the GU has healed - EGD read not available to [place] currently.  #3 history of poor tolerance to oral iron with significant GI distress.  She is also on chronic PPI therapy that puts her at higher risk of developing iron deficiency due to poor absorption.  And also B12 deficiency and possible vitamin D deficiency. Plan -patient notes increased fatigue which could be from her psoriatic arthritis and medications simponi/MTX. -fatigue due to be partly from iron deficiency -ferritin level over-estimated in the setting of significant inflammation -will give IV feraheme weekly x 2 to target ferritin level of around 250 in the setting of significant inflammation. -She will continue to follow up with Dr. Earlean Cardenas Fabienne Bruns to ensure resolution of gastric ulcer. -Reasonable to continue taking her multivitamin tablet.   Return to care with Dr.Jessen Siegman in  6 months with repeat CBC, CMP and iron labs. And to follow primary care physician for management of other chronic medical issues and with Dr.Deveshwar for mx of her rheumatologic issues.  All of the patients questions were answered with apparent satisfaction. The patient knows to call the clinic with any problems, questions or concerns.  I spent 20 minutes counseling the patient face to face. The total time spent in the appointment was 20 minutes and more than 50% was on counseling and  direct patient cares.    Sullivan Lone MD [place] AAHIVMS [facility] [place] Hematology/Oncology Physician [facility]  (Office):       [phone] (Work cell):  [phone] (Fax):           [phone]  05/18/2016 11:41 PM

## 2016-05-20 ENCOUNTER — Encounter: Payer: Self-pay | Admitting: Rheumatology

## 2016-05-20 ENCOUNTER — Ambulatory Visit (INDEPENDENT_AMBULATORY_CARE_PROVIDER_SITE_OTHER): Payer: BLUE CROSS/BLUE SHIELD | Admitting: Rheumatology

## 2016-05-20 ENCOUNTER — Ambulatory Visit (INDEPENDENT_AMBULATORY_CARE_PROVIDER_SITE_OTHER): Payer: Self-pay

## 2016-05-20 VITALS — BP 128/74 | HR 78 | Resp 14 | Ht 64.0 in | Wt 224.0 lb

## 2016-05-20 DIAGNOSIS — L4050 Arthropathic psoriasis, unspecified: Secondary | ICD-10-CM

## 2016-05-20 DIAGNOSIS — M5030 Other cervical disc degeneration, unspecified cervical region: Secondary | ICD-10-CM

## 2016-05-20 DIAGNOSIS — M79642 Pain in left hand: Secondary | ICD-10-CM

## 2016-05-20 DIAGNOSIS — M79641 Pain in right hand: Secondary | ICD-10-CM

## 2016-05-20 DIAGNOSIS — M47896 Other spondylosis, lumbar region: Secondary | ICD-10-CM

## 2016-05-20 DIAGNOSIS — M79671 Pain in right foot: Secondary | ICD-10-CM

## 2016-05-20 DIAGNOSIS — M1712 Unilateral primary osteoarthritis, left knee: Secondary | ICD-10-CM | POA: Diagnosis not present

## 2016-05-20 DIAGNOSIS — M79672 Pain in left foot: Secondary | ICD-10-CM | POA: Diagnosis not present

## 2016-05-20 DIAGNOSIS — E611 Iron deficiency: Secondary | ICD-10-CM | POA: Diagnosis not present

## 2016-05-20 DIAGNOSIS — M25561 Pain in right knee: Secondary | ICD-10-CM

## 2016-05-20 DIAGNOSIS — Z79899 Other long term (current) drug therapy: Secondary | ICD-10-CM | POA: Diagnosis not present

## 2016-05-20 DIAGNOSIS — G8929 Other chronic pain: Secondary | ICD-10-CM | POA: Diagnosis not present

## 2016-05-20 DIAGNOSIS — Z8781 Personal history of (healed) traumatic fracture: Secondary | ICD-10-CM | POA: Diagnosis not present

## 2016-05-20 DIAGNOSIS — L409 Psoriasis, unspecified: Secondary | ICD-10-CM | POA: Diagnosis not present

## 2016-05-20 DIAGNOSIS — M797 Fibromyalgia: Secondary | ICD-10-CM

## 2016-05-20 DIAGNOSIS — M47812 Spondylosis without myelopathy or radiculopathy, cervical region: Secondary | ICD-10-CM

## 2016-05-20 DIAGNOSIS — M25562 Pain in left knee: Secondary | ICD-10-CM

## 2016-05-20 DIAGNOSIS — R5382 Chronic fatigue, unspecified: Secondary | ICD-10-CM

## 2016-05-20 MED ORDER — CLOBETASOL PROPIONATE 0.05 % EX OINT: 1.0000 "application " | TOPICAL_OINTMENT | Freq: Two times a day (BID) | CUTANEOUS | 1 refills | Status: DC

## 2016-05-20 MED ORDER — LIDOCAINE HCL 1 % IJ SOLN
1.5000 mL | INTRAMUSCULAR | Status: AC | PRN
  Administered 2016-05-20: 1.5 mL

## 2016-05-20 MED ORDER — TRIAMCINOLONE ACETONIDE 40 MG/ML IJ SUSP
40.0000 mg | INTRAMUSCULAR | Status: AC | PRN
  Administered 2016-05-20: 40 mg via INTRA_ARTICULAR

## 2016-05-20 NOTE — Patient Instructions (Signed)
Standing Labs We placed an order today for your standing lab work.    Please come back and get your standing labs in June 2018.   We have open lab Monday through Friday from 8:30-11:30 AM and 1:30-4 PM at the office of Dr. Tresa Moore, PA.   The office is located at [address] [place], [place], [place] [address] No appointment is necessary.   Labs are drawn by [facility].  You may receive a bill from [facility] for your lab work.

## 2016-05-20 NOTE — Progress Notes (Signed)
  01/07/2015 X-rays of the C-spine, 2 views, are consistent with some hardware noted, 2 plates.    X-rays of bilateral hands, 2 views, compared to 10/27/2013 show bilateral PIP and DIP and MCP joint space narrowing.  Bilateral intercarpal joint space narrowing, bilateral radiocarpal joint space narrowing.  No change versus August 2015.

## 2016-05-20 NOTE — Progress Notes (Signed)
Rheumatology Medication Review by a Pharmacist Does the patient feel that his/her medications are working for him/her?  Yes Has the patient been experiencing any side effects to the medications prescribed?  No Does the patient have any problems obtaining medications?  No - patient gets Simponi from [employer] and denies any issues with delivery of her medication.    Issues to address at subsequent visits: None   Pharmacist comments:  Jade Cardenas is a pleasant 66 yo F who presents for follow up of her psoriatic arthritis.  She is currently taking Simponi 50 mg monthly, methotrexate 0.4 mL weekly, and folic acid 1 mg daily.  Patient had standing labs on 05/07/16 which were normal.  She will be due for standing labs again in June 2018.  Most recent TB test was negative in September 2017.  She will be due for TB test again in September 2018.  Patient denies any questions or concerns regarding her medications at this time.    Elisabeth Most, Pharm.D., BCPS, CPP Clinical Pharmacist Pager: [phone] Phone: [phone] 05/20/2016 10:18 AM

## 2016-05-21 ENCOUNTER — Ambulatory Visit (HOSPITAL_BASED_OUTPATIENT_CLINIC_OR_DEPARTMENT_OTHER): Payer: BLUE CROSS/BLUE SHIELD

## 2016-05-21 VITALS — BP 149/51 | HR 70 | Temp 97.7°F | Resp 16

## 2016-05-21 DIAGNOSIS — D509 Iron deficiency anemia, unspecified: Secondary | ICD-10-CM | POA: Diagnosis not present

## 2016-05-21 DIAGNOSIS — E611 Iron deficiency: Secondary | ICD-10-CM

## 2016-05-21 MED ORDER — SODIUM CHLORIDE 0.9 % IV SOLN
510.0000 mg | Freq: Once | INTRAVENOUS | Status: AC
  Administered 2016-05-21: 510 mg via INTRAVENOUS
  Filled 2016-05-21: qty 17

## 2016-05-21 NOTE — Patient Instructions (Signed)

## 2016-05-29 ENCOUNTER — Ambulatory Visit (HOSPITAL_BASED_OUTPATIENT_CLINIC_OR_DEPARTMENT_OTHER): Payer: BLUE CROSS/BLUE SHIELD

## 2016-05-29 VITALS — BP 128/66 | HR 73 | Temp 98.3°F | Resp 16

## 2016-05-29 DIAGNOSIS — E611 Iron deficiency: Secondary | ICD-10-CM

## 2016-05-29 DIAGNOSIS — D509 Iron deficiency anemia, unspecified: Secondary | ICD-10-CM | POA: Diagnosis not present

## 2016-05-29 MED ORDER — SODIUM CHLORIDE 0.9 % IV SOLN
510.0000 mg | Freq: Once | INTRAVENOUS | Status: AC
  Administered 2016-05-29: 510 mg via INTRAVENOUS
  Filled 2016-05-29: qty 17

## 2016-05-29 NOTE — Patient Instructions (Signed)

## 2016-06-05 ENCOUNTER — Ambulatory Visit (INDEPENDENT_AMBULATORY_CARE_PROVIDER_SITE_OTHER): Payer: BLUE CROSS/BLUE SHIELD | Admitting: Podiatry

## 2016-06-05 ENCOUNTER — Encounter: Payer: Self-pay | Admitting: Podiatry

## 2016-06-05 DIAGNOSIS — L600 Ingrowing nail: Secondary | ICD-10-CM

## 2016-06-08 ENCOUNTER — Other Ambulatory Visit: Payer: Self-pay | Admitting: Obstetrics & Gynecology

## 2016-06-08 DIAGNOSIS — Z9882 Breast implant status: Secondary | ICD-10-CM

## 2016-06-08 DIAGNOSIS — Z1231 Encounter for screening mammogram for malignant neoplasm of breast: Secondary | ICD-10-CM

## 2016-06-08 DIAGNOSIS — Z9889 Other specified postprocedural states: Secondary | ICD-10-CM

## 2016-06-08 NOTE — Progress Notes (Signed)
Subjective: 66 year old female presents the office today for concerns of ingrown tones the left second toe which nonetheless couple weeks. She did try to cut the area out herself as is improved but she still has some pain in the area. Denies any redness or drainage or any pus. Denies any systemic complaints such as fevers, chills, nausea, vomiting. No acute changes since last appointment, and no other complaints at this time.   Objective: AAO x3, NAD DP/PT pulses palpable bilaterally, CRT less than 3 seconds There is incurvation along the medial aspect left second toe. She did try to cut the toenail herself recently dried blood other some residual tenderness to palpation there is mild incurvation still present. There is no erythema but there is localized edema. There is no drainage or pus expressed. No open lesions or pre-ulcerative lesions.  No pain with calf compression, swelling, warmth, erythema  Assessment: Ingrown toenail  left second toe  Plan: -All treatment options discussed with the patient including all alternatives, risks, complications.  -I further debrided this medial border left second toe today without any complications or bleeding. Recommended continue Epson salt soaks as well as covering with antibiotic ointment and a bandage daily. Discussed that if symptoms continue partial nail avulsion but she wishes to hold off on that today. -Follow up with symptoms not resolve the next couple weeks or sooner if needed. -Patient encouraged to call the office with any questions, concerns, change in symptoms.   Celesta Gentile, DPM

## 2016-06-19 ENCOUNTER — Telehealth: Payer: Self-pay | Admitting: Pharmacist

## 2016-06-19 NOTE — Telephone Encounter (Signed)
I received a call from Mrs. Knowlton today asking about the status of her authorization for visco injections.  I advised patient I will forward a message to Ivin Booty to check on the status of her visco injections.  Patient voiced understanding.

## 2016-06-22 NOTE — Telephone Encounter (Signed)
Applied for Euflexxa BIL

## 2016-06-24 NOTE — Progress Notes (Signed)
Office Visit Note  Patient: Jade Cardenas             Date of Birth: 02/03/1951           MRN: [id number]             PCP: Lottie Dawson, MD Referring: [name], MD Visit Date: 06/25/2016 Occupation: _0 @    Subjective:  Pain of the Right Knee (Wants injection) and Injections (Simponi injection left arm sub Q)   History of Present Illness: Jade Cardenas is a 66 y.o. female  Last seen 02/19/2016  History of PsA & Ps ==> On simponi subQ 10m every month ==> plan: increase to 1069mif inadequate response at today's visit. (since pt is doing well at current dose, we will conintue at current dose of 5059mubQ every month.) MTX 0.4ml98mek (doing well). Folic 1mg 56mtient had PPD done through Dr. WandaMariann Lasterche's office September 2017 and it was negative according to patient.   NOTE:  FAILED OTEZLA (CAUSED DIARRHEA & NAUSEA)              FAILED  ARAVA (caused STOMACH CRAMPS AND DIARRHEA).              FAILED MTX pills  (DUE TO DIARRHEA AND STOMACH CRAMPS ==> willing to try injectable.) (currently, pt is tolerating  mtx injectable well.)   History of FMS ==> doing well with fibromyalgia at this time.  Main complaint is that she's having some right knee pain today. We'll give her cortisone injection to the right knee today.  She would like to get Visco supplementation to both knees as soon as possible.  She gives a history of having left knee joint area may have been bitten by an insect (possibly a spider) and its wall lump became warm and red. Patient uses Benadryl and Caladryl and she did well.  A week or 2 later she had a similar episode where that occurred behind the right knee. It resolved on its own.  She cannot recall using any new detergents, perfumes, lotions, etc.  She also states that her PPD was done at Dr. WandaNilda Calamityce back in September 2017 and was negative. She cannot get a TB gold since is comes back indeterminate.  Patient  also wants refill on her Lidoderm patch. Lately she's been having pain from the lower back radiating down her right leg to her mid posterior thigh. She gets benefit when she uses a Lidoderm patch.  After getting her right knee cortisone shot today, it is possible that her back pain may improve.  Activities of Daily Living:  Patient reports morning stiffness for 15 minutes.   Patient Reports nocturnal pain.  Difficulty dressing/grooming: Denies Difficulty climbing stairs: Reports Difficulty getting out of chair: Reports Difficulty using hands for taps, buttons, cutlery, and/or writing: Denies   Review of Systems  Constitutional: Negative for fatigue.  HENT: Negative for mouth sores and mouth dryness.   Eyes: Negative for dryness.  Respiratory: Negative for shortness of breath.   Gastrointestinal: Negative for constipation and diarrhea.  Musculoskeletal: Negative for myalgias and myalgias.  Skin: Negative for sensitivity to sunlight.  Psychiatric/Behavioral: Negative for decreased concentration and sleep disturbance.    PMFS History:  Patient Active Problem List   Diagnosis Date Noted  . 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 Fibromyalgia 05/17/2016  . Other psoriasis 02/18/2016  . High risk medication use 02/18/2016  . DJD (degenerative joint disease), cervical 02/18/2016  . Heel spur 09/29/2015  .  Ingrown toenail 09/29/2015  . Iron deficiency anemia due to chronic blood loss 08/14/2015  . Foraminal stenosis of cervical region 07/30/2015  . Microcytic hypochromic anemia 05/22/2015  . Tailor's bunion, left 01/18/2015  . H/O hiatal hernia   . Irritable bowel syndrome (IBS) 03/01/2014  . Primary osteoarthritis involving multiple joints 12/15/2013  . Iron deficiency 12/15/2013  . Psoriatic arthritis ([place]) 12/15/2013  . History of foot fracture 12/15/2013  . Dermatitis 10/17/2013  . 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 Arthritis 05/31/2013  . Hx of diethylstilbestrol (DES) exposure  in utero unknown 12/09/2012  . Skin lesion 12/09/2012  . Tinnitus 06/23/2012  . Scalp lesion 01/21/2012  . Headache(784.0) 01/21/2012  . Hx of psoriasis 01/21/2012  . Weight gain 01/21/2012  . Ankylosing spondylitis  possible 09/03/2011  . Hx of transfusion 09/03/2011  . S/P lumbar fusion 6 13 09/03/2011  . Lumbar stenosis with neurogenic claudication 08/05/2011  . LBP (low back pain) 01/17/2011  . ALLERGIC RHINITIS 02/19/2010  . GERD 02/19/2010  . ARTHRITIS 02/19/2010  . SPINAL STENOSIS, CERVICAL 02/19/2010  . OSTEOPENIA 02/19/2010  . WEIGHT GAIN 12/18/2008  . RASH AND OTHER NONSPECIFIC SKIN ERUPTION 12/07/2007  . IBS 04/28/2007  . PALPITATIONS 02/09/2007  . History of gastric ulcer 02/09/2007  . COLONIC POLYPS, HX OF 02/09/2007  . REDUCTION MAMMOPLASTY, HX OF 02/09/2007  . CHOLECYSTECTOMY, HX OF 02/09/2007  . Hyperlipidemia 12/29/2006  . Essential hypertension 12/29/2006    Past Medical History:  Diagnosis Date  . Abnormal Pap smear    H/O  . Anemia    iron def  . Arthritis    rheumatoid and osteo now flt to be psoriateic   . Arthritis    psoreratic, osteoarthritis  . Broken toe   . Bruises easily   . CHOLECYSTECTOMY, HX OF   . Chronic back pain   . COLONIC POLYPS, HX OF 02/09/2007  . Cough, persistent 01/21/2012   poss from acei   . 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 Fibromyalgia    doesn't require meds  . GASTRIC ULCER, ACUTE, HEMORRHAGE, HX OF 02/09/2007  . GERD (gastroesophageal reflux disease)    takes Nexium daily  . Goiter   . H/O hiatal hernia   . Headache(784.0)    occasionally  . History of bronchitis   . Hx of seasonal allergies    takes Claritin daily  . HYPERLIPIDEMIA 12/29/2006   takes Lovastatin nightly  . HYPERTENSION 12/29/2006   takes Enalapril daily  . IBS 04/28/2007  . Insomnia    related to pain;takes Flexeril and Tylenol PM nightly  . Joint pain   . Joint swelling   . MENOPAUSAL DISORDER 12/07/2007  . Palpitations 02/09/2007    . PONV (postoperative nausea and vomiting)    NAUSEA  . REDUCTION MAMMOPLASTY, HX OF 02/09/2007  . Rheumatoid arthritis(714.0) 12/29/2006  . Right bundle branch block 02/09/2007  . S/P lumbar fusion 6 13 09/03/2011   L4 L5  posterior   . SYNCOPE 12/07/2007  . UNS ADVRS EFF OTH RX MEDICINAL\T\BIOLOGICAL SBSTNC 02/09/2007  . UTI (lower urinary tract infection)    hx of;saw urologist last yr and he told her she was over reacting and not a big deal   . WEIGHT GAIN 12/18/2008    Family History  Problem Relation Age of Onset  . Diabetes Mother   . Colon cancer Mother     x2  . Arthritis Mother   . Hypertension Mother   . Endometrial cancer Mother   .  Stroke Father   . Heart disease Father   . Colon cancer Father   . Arthritis Brother   . Hypertension Brother   . Hyperlipidemia Brother   . Other      DISH brother  . Hypertension Maternal Grandmother   . Stroke Maternal Grandmother   . Infertility Brother   . Anesthesia problems Neg Hx   . Hypotension Neg Hx   . Malignant hyperthermia Neg Hx   . Pseudochol deficiency Neg Hx    Past Surgical History:  Procedure Laterality Date  . ABDOMINAL HYSTERECTOMY  1986   TAH/BSO  . BACK SURGERY  12+yrs ago   Synovial Cyst removal  . BACK SURGERY  6/13   lumbar L4-L5 fusion, lamenectomy  . blood transfused  6/13   transfused 2 units post op from back surgery  . bone spur  6-53yr ago   right ankle  . BREAST REDUCTION SURGERY     last 80's  . BUNIONECTOMY  10/03/14   right foot  . CERVICAL FUSION     2 12   . CHOLECYSTECTOMY  1996  . COLONOSCOPY    . ESOPHAGOGASTRODUODENOSCOPY ENDOSCOPY    . POSTERIOR CERVICAL LAMINECTOMY Left 07/30/2015   Procedure: Laminectomy and Foraminotomy - left - Cervical two -Cervical three;  Surgeon: HEarnie Larsson MD;  [facility];  Service: Neurosurgery;  Laterality: Left;  . 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 She appears well-developed and well-nourished.  HENT:  Head: Normocephalic and atraumatic.  Eyes: EOM are normal. Pupils are equal, round, and reactive to light.  Cardiovascular: Normal rate, regular rhythm and normal heart sounds.  Exam reveals no gallop and no friction rub.   No murmur heard. Pulmonary/Chest: Effort normal and breath sounds normal. She has no wheezes. She has no rales.  Abdominal: Soft. Bowel sounds are normal. She exhibits no distension. There is no tenderness. There is no guarding. No hernia.  Musculoskeletal: Normal range of motion. She exhibits no edema, tenderness or deformity.  Lymphadenopathy:    She has no cervical adenopathy.  Neurological: She is alert and oriented to person, place, and time. Coordination normal.  Skin: Skin is warm and dry. Capillary refill takes less than 2 seconds. No rash noted.  Psychiatric: She has a normal mood and affect. Her behavior is normal.  Nursing note and vitals reviewed.    Musculoskeletal Exam:  Full range of motion of all joints (not patient has surgery to the C-spine and has limited range of motion). Grip strength is equal and strong bilaterally Fiber myalgia tender points are doing well at this time (not bothering pt at this visit).  CDAI Exam: No CDAI exam completed.  No synovitis on examination today. Her main complaint is headache she's having some right knee pain.   Investigation: No additional findings.   Appointment on 05/07/2016  Component Date Value Ref Range Status  . WBC 05/07/2016 5.9  3.9 - 10.3 10e3/uL Final  . NEUT# 05/07/2016 3.9  1.5 - 6.5 10e3/uL Final  . HGB 05/07/2016 13.7  11.6 - 15.9  g/dL Final  . HCT 05/07/2016 39.3  34.8 - 46.6 %  Final  . Platelets 05/07/2016 291  145 - 400 10e3/uL Final  . MCV 05/07/2016 92.9  79.5 - 101.0 fL Final  . MCH 05/07/2016 32.4  25.1 - 34.0 pg Final  . MCHC 05/07/2016 34.9  31.5 - 36.0 g/dL Final  . RBC 05/07/2016 4.23  3.70 - 5.45 10e6/uL Final  . RDW 05/07/2016 13.8  11.2 - 14.5 % Final  . lymph# 05/07/2016 1.8  0.9 - 3.3 10e3/uL Final  . MONO# 05/07/2016 0.3  0.1 - 0.9 10e3/uL Final  . Eosinophils Absolute 05/07/2016 0.0  0.0 - 0.5 10e3/uL Final  . Basophils Absolute 05/07/2016 0.0  0.0 - 0.1 10e3/uL Final  . NEUT% 05/07/2016 64.9  38.4 - 76.8 % Final  . LYMPH% 05/07/2016 29.5  14.0 - 49.7 % Final  . MONO% 05/07/2016 4.4  0.0 - 14.0 % Final  . EOS% 05/07/2016 0.7  0.0 - 7.0 % Final  . BASO% 05/07/2016 0.5  0.0 - 2.0 % Final  . Retic % 05/07/2016 2.48* 0.70 - 2.10 % Final  . Retic Ct Abs 05/07/2016 104.90* 33.70 - 90.70 10e3/uL Final  . Immature Retic Fract 05/07/2016 13.60* 1.60 - 10.00 % Final  . Sodium 05/07/2016 141  136 - 145 mEq/L Final  . Potassium 05/07/2016 4.1  3.5 - 5.1 mEq/L Final  . Chloride 05/07/2016 105  98 - 109 mEq/L Final  . CO2 05/07/2016 26  22 - 29 mEq/L Final  . Glucose 05/07/2016 135  70 - 140 mg/dl Final  . BUN 05/07/2016 20.4  7.0 - 26.0 mg/dL Final  . Creatinine 05/07/2016 0.8  0.6 - 1.1 mg/dL Final  . Total Bilirubin 05/07/2016 0.63  0.20 - 1.20 mg/dL Final  . Alkaline Phosphatase 05/07/2016 107  40 - 150 U/L Final  . AST 05/07/2016 18  5 - 34 U/L Final  . ALT 05/07/2016 21  0 - 55 U/L Final  . Total Protein 05/07/2016 7.9  6.4 - 8.3 g/dL Final  . Albumin 05/07/2016 4.2  3.5 - 5.0 g/dL Final  . Calcium 05/07/2016 9.8  8.4 - 10.4 mg/dL Final  . Anion Gap 05/07/2016 10  3 - 11 mEq/L Final  . EGFR 05/07/2016 75* >90 ml/min/1.73 m2 Final  . Ferritin 05/07/2016 148  9 - 269 ng/ml Final  Office Visit on 03/13/2016  Component Date Value Ref Range Status  . COMMENTS: 03/13/2016 [facility]   Final-Edited   Comment: [place], [place] [address] [place] [address] GYN CYTOLOGY REPORT  PATIENT NAME:Zweber, [facility] S PATHOLOGY#:[id number]: F DOB: 1950-11-04 (Age: 64) MEDICAL RECORD LZJQBH:[id number] DOCTOR:Suzanne Sabra Heck, M.D. DATE OBTAINED:1/12/2018CLIENT:[facility] RECEIVED:1/16/2018OTHER PHYS: DATE SIGNED:03/18/2016 PAP Thinlayer with HPV Final Cytologic Interpretation:       Cervical, ThinLayer with Automated Imaging and Dual Review, CPT 88175      Negative for Intraepithelial Lesions or Malignancy.       ADEQUACY OF SPECIMEN:           Satisfactory for evaluation.              OTHER CYTOLOGIC FINDINGS:            Fungal organisms morphologically consistent with Candida spp.       NOTE: This Pap test has been evaluated with computer assisted technology.       Electronically signed by: LDT, CT(ASCP), [id number], [place], [place], (Med.  Dir.: Sandrea Hughs, MD)  PAL, CT(ASCP) med/1/17/2018The Pap test is a screening                           mechanism with excellent but not perfect ability to prevent cervical carcinoma.  It has a low, but significant, diagnostic error rate. The pap test is suboptimal  for detection of glandular lesions.   It should be noted that a negative result does not definitively rule out the presence of disease.Ref: DeMay, RM, The Art and Science of Cytopathology, [employer], [phone]. HPV Results   High Risk HPV -    Not Detected  Reference Range = Not Detected A result of "Detected" signifies the presence of one or more high risk types of HPV.  The APTIMA HPV Assay is an in vitro nucleic acid amplification test for the qualitative detection of E6/E7 viral messenger RNA (mRNA) from 14 high-risk types of  human papillomavirus (HPV) in cervical specimens. The high-risk HPV types detected by the assay include: 16, 18, 31, 33, 35, 39, 45, 51, 52, 56, 58, 59, 66, and 68. APTIMA HPV method will be performed on the [employer].    The APTIMA HPV Assay is designed to enhance existin                          g methods for the detection of cervical disease and should be used in conjunction with clinical information derived from other diagnostic and screening tests, physical examinations, and full medical  history in accordance with appropriate patient management procedures. The APTIMA HPV Assay on cervical ThinPrep(tm) PreservCyt(tm) specimens is FDA approved on the [employer].  The vaginal ThinPrep(tm) PreservCyt(tm) specimen source has been validated as a minor modification.  The APTIMA HPV Assay on  SurePath(tm) specimens was developed and its performance characteristics determined by [facility].  It has not been cleared or approved by the U.S. Food and Drug Administration.  The FDA has determined that such clearance or approval is not  necessary.  This test is used for clinical purposes.  This laboratory is certified under the [place] (CLIA) as qualified to perform high complexity clinical laboratory testin                          g. Electronically signed by: [place], [place](ASCP) [address], [place], [place] (Med. Dir.: Sandrea Hughs) Last Menstrual Period: 03/02/1984 Menstrual/Pregnancy History: Hysterectomy: H/O TAH   Other Clinical Conditions: Prenatal exposure to DES Technical processing performed at [facility], [address], [place], [place] [address], CLIA# [id number], unless otherwise indicated.   Orders Only on 03/05/2016  Component Date Value Ref Range Status  . WBC 03/05/2016 6.7  3.8 - 10.8 K/uL Final  . RBC 03/05/2016 4.40  3.80 - 5.10 MIL/uL Final  . Hemoglobin 03/05/2016 14.0  11.7 - 15.5 g/dL Final  . HCT 03/05/2016 41.5  35.0 - 45.0 % Final  . MCV 03/05/2016 94.3  80.0 - 100.0 fL Final  . MCH 03/05/2016 31.8  27.0 - 33.0 pg Final  . MCHC 03/05/2016 33.7  32.0 - 36.0 g/dL Final  . RDW 03/05/2016 13.8  11.0 -  15.0 % Final  . Platelets 03/05/2016 359  140 - 400 K/uL Final  . MPV 03/05/2016 9.4  7.5 - 12.5 fL Final  . Neutro Abs 03/05/2016  4355  1,500 - 7,800 cells/uL Final  . Lymphs Abs 03/05/2016 1876  850 - 3,900 cells/uL Final  . Monocytes Absolute 03/05/2016 402  200 - 950 cells/uL Final  . Eosinophils Absolute 03/05/2016 67  15 - 500 cells/uL Final  . Basophils Absolute 03/05/2016 0  0 - 200 cells/uL Final  . Neutrophils Relative % 03/05/2016 65  % Final  . Lymphocytes Relative 03/05/2016 28  % Final  . Monocytes Relative 03/05/2016 6  % Final  . Eosinophils Relative 03/05/2016 1  % Final  . Basophils Relative 03/05/2016 0  % Final  . Smear Review 03/05/2016 Criteria for review not met   Final  . Sodium 03/05/2016 142  135 - 146 mmol/L Final  . Potassium 03/05/2016 4.6  3.5 - 5.3 mmol/L Final  . Chloride 03/05/2016 104  98 - 110 mmol/L Final  . CO2 03/05/2016 25  20 - 31 mmol/L Final  . Glucose, Bld 03/05/2016 107* 65 - 99 mg/dL Final  . BUN 03/05/2016 19  7 - 25 mg/dL Final  . Creat 03/05/2016 0.78  0.50 - 0.99 mg/dL Final   Comment:   For patients > or = 65 years of age: The upper reference limit for Creatinine is approximately 13% higher for people identified as African-American.     . Total Bilirubin 03/05/2016 0.7  0.2 - 1.2 mg/dL Final  . Alkaline Phosphatase 03/05/2016 100  33 - 130 U/L Final  . AST 03/05/2016 22  10 - 35 U/L Final  . ALT 03/05/2016 21  6 - 29 U/L Final  . Total Protein 03/05/2016 7.3  6.1 - 8.1 g/dL Final  . Albumin 03/05/2016 4.3  3.6 - 5.1 g/dL Final  . Calcium 03/05/2016 9.5  8.6 - 10.4 mg/dL Final  . GFR, Est African American 03/05/2016 >89  >=60 mL/min Final  . GFR, Est Non African American 03/05/2016 81  >=60 mL/min Final     PPD negative September 2016  Imaging: No results found.  Speciality Comments: No specialty comments available.    Procedures:  Large Joint Inj Date/Time: 06/25/2016 10:41 AM Performed by: Eliezer Lofts Authorized  by: Eliezer Lofts   Consent Given by:  Patient Site marked: the procedure site was marked   Timeout: prior to procedure the correct patient, procedure, and site was verified   Indications:  Pain and joint swelling Location:  Knee Site:  R knee Prep: patient was prepped and draped in usual sterile fashion   Needle Size:  27 G Needle Length:  1.5 inches Approach:  Medial Ultrasound Guidance: No   Fluoroscopic Guidance: No   Arthrogram: No   Medications:  40 mg triamcinolone acetonide 40 MG/ML; 2 mL lidocaine 1 % Aspiration Attempted: Yes   Aspirate amount (mL):  0 Patient tolerance:  Patient tolerated the procedure well with no immediate complications  Right knee pain. Rates her discomfort as 8 on a scale of 0-10. Tolerated injection well. No complication. 12 mL's of 1% Xylocaine without epinephrine mixed with 40 mg of Kenalog.   Allergies: Erythromycin; Iodinated diagnostic agents; Ace inhibitors; Dilaudid [hydromorphone hcl]; Morphine and related; Keflex [cephalexin]; and Macrobid [nitrofurantoin]   Assessment / Plan:     Visit Diagnoses: Psoriatic arthritis ([place]) - 06/25/2016: No joint pain, swelling, stiffness. - Plan: Golimumab SOAJ 50 mg  Other psoriasis - 06/25/2016: Small Ps Lesion to bridge of nose  High risk medication use - =============Simponi 74m sub q ---> adequate responseMTX 0.457m/ week ---> tolerating well; no side effectsFolic acid  82m qdPPD @ Dr. PRegis BillSept 2017   Primary osteoarthritis of both knees - 06/25/2016:-->  Apply Hyalgan bilateral knees 5; Orthovisc and Euflexxa also acceptable if that is what insurance prefers  Chronic pain of right knee  Iron deficiency anemia due to chronic blood loss - 06/25/2016 ==>pt states she is Seeing Hematologist and got infusion recently and doing well.  Fibromyalgia - 06/25/2016: Doing well with fibromyalgia today.   Apply Hyalgan bilateral knees 5; Orthovisc and Euflexxa also acceptable if that is what  insurance prefers  Orders: Orders Placed This Encounter  Procedures  . Large Joint Injection/Arthrocentesis   Meds ordered this encounter  Medications  . Golimumab SOAJ 50 mg  . lidocaine (LIDODERM) 5 %    Sig: Place 1 patch onto the skin daily. Remove & Discard patch within 12 hours or as directed by MD    Dispense:  30 patch    Refill:  2    Order Specific Question:   Supervising Provider    Answer:   DBo Merino[[id number]]  . methotrexate 50 MG/2ML injection    Sig: INJECT 0.4 MLS (10 MG TOTAL) INTO THE SKIN ONCE.    Dispense:  6 mL    Refill:  0    Please dispense 90 day supply with preservatives only;    Order Specific Question:   Supervising Provider    Answer:   DBo Merino[[id number]]  . folic acid (FOLVITE) 1 MG tablet    Sig: Take 1 tablet (1 mg total) by mouth daily.    Dispense:  90 tablet    Refill:  4    Order Specific Question:   Supervising Provider    Answer:   DBo [phone]   Face-to-face time spent with patient was 30 minutes. 50% of time was spent in counseling and coordination of care.  Follow-Up Instructions: No Follow-up on file.   NEliezer Lofts PA-C  Note - This record has been created using [employer]  Chart creation errors have been sought, but may not always  have been located. Such creation errors do not reflect on  the standard of medical care.

## 2016-06-25 ENCOUNTER — Encounter: Payer: Self-pay | Admitting: Rheumatology

## 2016-06-25 ENCOUNTER — Ambulatory Visit (INDEPENDENT_AMBULATORY_CARE_PROVIDER_SITE_OTHER): Payer: BLUE CROSS/BLUE SHIELD | Admitting: Rheumatology

## 2016-06-25 VITALS — BP 128/72 | HR 72 | Resp 14

## 2016-06-25 DIAGNOSIS — L4050 Arthropathic psoriasis, unspecified: Secondary | ICD-10-CM

## 2016-06-25 DIAGNOSIS — D500 Iron deficiency anemia secondary to blood loss (chronic): Secondary | ICD-10-CM | POA: Diagnosis not present

## 2016-06-25 DIAGNOSIS — Z79899 Other long term (current) drug therapy: Secondary | ICD-10-CM | POA: Diagnosis not present

## 2016-06-25 DIAGNOSIS — M25561 Pain in right knee: Secondary | ICD-10-CM

## 2016-06-25 DIAGNOSIS — M170 Bilateral primary osteoarthritis of knee: Secondary | ICD-10-CM

## 2016-06-25 DIAGNOSIS — L408 Other psoriasis: Secondary | ICD-10-CM | POA: Diagnosis not present

## 2016-06-25 DIAGNOSIS — M1711 Unilateral primary osteoarthritis, right knee: Secondary | ICD-10-CM | POA: Diagnosis not present

## 2016-06-25 DIAGNOSIS — M797 Fibromyalgia: Secondary | ICD-10-CM | POA: Diagnosis not present

## 2016-06-25 DIAGNOSIS — M1712 Unilateral primary osteoarthritis, left knee: Secondary | ICD-10-CM | POA: Diagnosis not present

## 2016-06-25 DIAGNOSIS — G8929 Other chronic pain: Secondary | ICD-10-CM | POA: Diagnosis not present

## 2016-06-25 MED ORDER — TRIAMCINOLONE ACETONIDE 40 MG/ML IJ SUSP
40.0000 mg | INTRAMUSCULAR | Status: AC | PRN
  Administered 2016-06-25: 40 mg via INTRA_ARTICULAR

## 2016-06-25 MED ORDER — LIDOCAINE HCL 1 % IJ SOLN
2.0000 mL | INTRAMUSCULAR | Status: AC | PRN
  Administered 2016-06-25: 2 mL

## 2016-06-25 MED ORDER — FOLIC ACID 1 MG PO TABS: 1.0000 mg | ORAL_TABLET | Freq: Every day | ORAL | 4 refills | Status: DC

## 2016-06-25 MED ORDER — LIDOCAINE 5 % EX PTCH: 1.0000 | MEDICATED_PATCH | CUTANEOUS | 2 refills | Status: DC

## 2016-06-25 MED ORDER — GOLIMUMAB 50 MG/0.5ML ~~LOC~~ SOAJ
50.0000 mg | Freq: Once | SUBCUTANEOUS | Status: AC
  Administered 2016-06-25: 50 mg via SUBCUTANEOUS

## 2016-06-25 MED ORDER — METHOTREXATE SODIUM CHEMO INJECTION 50 MG/2ML: INTRAMUSCULAR | 0 refills | Status: DC

## 2016-07-01 ENCOUNTER — Ambulatory Visit
Admission: RE | Admit: 2016-07-01 | Discharge: 2016-07-01 | Disposition: A | Discharge status: Home / Self Care | Payer: BLUE CROSS/BLUE SHIELD | Source: Ambulatory Visit | Attending: Obstetrics & Gynecology | Admitting: Obstetrics & Gynecology

## 2016-07-01 DIAGNOSIS — Z9889 Other specified postprocedural states: Secondary | ICD-10-CM

## 2016-07-01 DIAGNOSIS — Z9882 Breast implant status: Secondary | ICD-10-CM

## 2016-07-01 DIAGNOSIS — Z1231 Encounter for screening mammogram for malignant neoplasm of breast: Secondary | ICD-10-CM

## 2016-07-08 NOTE — Telephone Encounter (Signed)
In folder, pending benefits

## 2016-07-15 NOTE — Telephone Encounter (Signed)
[facility] Euflexxa to followup on this one, they never received the fax.  I have refaxed it to them.

## 2016-07-21 NOTE — Telephone Encounter (Signed)
Now submitted online, will followup.

## 2016-07-21 NOTE — Telephone Encounter (Signed)
Please call pt and schedule  Euflexxa x 3 bilateral knees, buy and bill.  Tell her for me that her insurance will cover them at 100%.  Thanks.

## 2016-07-23 NOTE — Telephone Encounter (Signed)
Left a vm for the patient to call the office back to schedule her Euflexxa injections

## 2016-07-28 ENCOUNTER — Other Ambulatory Visit: Payer: Self-pay | Admitting: Internal Medicine

## 2016-08-05 ENCOUNTER — Telehealth: Payer: Self-pay

## 2016-08-05 ENCOUNTER — Ambulatory Visit (INDEPENDENT_AMBULATORY_CARE_PROVIDER_SITE_OTHER): Payer: BLUE CROSS/BLUE SHIELD | Admitting: Rheumatology

## 2016-08-05 ENCOUNTER — Encounter: Payer: Self-pay | Admitting: Rheumatology

## 2016-08-05 VITALS — BP 128/78 | HR 78

## 2016-08-05 DIAGNOSIS — E538 Deficiency of other specified B group vitamins: Secondary | ICD-10-CM | POA: Diagnosis not present

## 2016-08-05 DIAGNOSIS — D500 Iron deficiency anemia secondary to blood loss (chronic): Secondary | ICD-10-CM | POA: Diagnosis not present

## 2016-08-05 DIAGNOSIS — M1712 Unilateral primary osteoarthritis, left knee: Secondary | ICD-10-CM | POA: Diagnosis not present

## 2016-08-05 DIAGNOSIS — M25562 Pain in left knee: Secondary | ICD-10-CM

## 2016-08-05 DIAGNOSIS — L4050 Arthropathic psoriasis, unspecified: Secondary | ICD-10-CM | POA: Diagnosis not present

## 2016-08-05 DIAGNOSIS — R5382 Chronic fatigue, unspecified: Secondary | ICD-10-CM

## 2016-08-05 DIAGNOSIS — M25561 Pain in right knee: Secondary | ICD-10-CM

## 2016-08-05 DIAGNOSIS — Z79899 Other long term (current) drug therapy: Secondary | ICD-10-CM | POA: Diagnosis not present

## 2016-08-05 DIAGNOSIS — M1711 Unilateral primary osteoarthritis, right knee: Secondary | ICD-10-CM | POA: Diagnosis not present

## 2016-08-05 DIAGNOSIS — M170 Bilateral primary osteoarthritis of knee: Secondary | ICD-10-CM

## 2016-08-05 LAB — CBC WITH DIFFERENTIAL/PLATELET
Basophils Absolute: 54 cells/uL (ref 0–200)
Basophils Relative: 1 %
Eosinophils Absolute: 54 cells/uL (ref 15–500)
Eosinophils Relative: 1 %
HCT: 41.7 % (ref 35.0–45.0)
Hemoglobin: 14.5 g/dL (ref 11.7–15.5)
Lymphocytes Relative: 28 %
Lymphs Abs: 1512 cells/uL (ref 850–3900)
MCH: 34 pg — ABNORMAL HIGH (ref 27.0–33.0)
MCHC: 34.8 g/dL (ref 32.0–36.0)
MCV: 97.9 fL (ref 80.0–100.0)
MPV: 9.1 fL (ref 7.5–12.5)
Monocytes Absolute: 378 cells/uL (ref 200–950)
Monocytes Relative: 7 %
Neutro Abs: 3402 cells/uL (ref 1500–7800)
Neutrophils Relative %: 63 %
Platelets: 308 10*3/uL (ref 140–400)
RBC: 4.26 MIL/uL (ref 3.80–5.10)
RDW: 15.1 % — ABNORMAL HIGH (ref 11.0–15.0)
WBC: 5.4 10*3/uL (ref 3.8–10.8)

## 2016-08-05 LAB — COMPLETE METABOLIC PANEL WITH GFR
ALT: 26 U/L (ref 6–29)
AST: 20 U/L (ref 10–35)
Albumin: 4.7 g/dL (ref 3.6–5.1)
Alkaline Phosphatase: 87 U/L (ref 33–130)
BUN: 16 mg/dL (ref 7–25)
CO2: 26 mmol/L (ref 20–31)
Calcium: 9.8 mg/dL (ref 8.6–10.4)
Chloride: 104 mmol/L (ref 98–110)
Creat: 0.72 mg/dL (ref 0.50–0.99)
GFR, Est African American: >89 mL/min (ref >=60)
GFR, Est Non African American: 89 mL/min (ref >=60)
Glucose, Bld: 90 mg/dL (ref 65–99)
Potassium: 4.3 mmol/L (ref 3.5–5.3)
Sodium: 142 mmol/L (ref 135–146)
Total Bilirubin: 0.8 mg/dL (ref 0.2–1.2)
Total Protein: 7.7 g/dL (ref 6.1–8.1)

## 2016-08-05 LAB — IRON AND TIBC
%SAT: 38 % (ref 11–50)
Iron: 125 ug/dL (ref 45–160)
TIBC: 326 ug/dL (ref 250–450)
UIBC: 201 ug/dL

## 2016-08-05 MED ORDER — LIDOCAINE HCL (PF) 1 % IJ SOLN
1.5000 mL | INTRAMUSCULAR | Status: AC | PRN
  Administered 2016-08-05: 1.5 mL

## 2016-08-05 MED ORDER — METHOTREXATE SODIUM CHEMO INJECTION 50 MG/2ML: INTRAMUSCULAR | 0 refills | Status: DC

## 2016-08-05 MED ORDER — TRIAMCINOLONE ACETONIDE 40 MG/ML IJ SUSP
40.0000 mg | INTRAMUSCULAR | Status: AC | PRN
  Administered 2016-08-05: 40 mg via INTRA_ARTICULAR

## 2016-08-05 MED ORDER — LIDOCAINE 5 % EX PTCH: 1.0000 | MEDICATED_PATCH | CUTANEOUS | 2 refills | Status: DC

## 2016-08-05 NOTE — Progress Notes (Signed)
Office Visit Note  Patient: Jade Cardenas             Date of Birth: [birth year]           MRN: [id number]             PCP: [name], MD Referring: [name], MD Visit Date: 08/05/2016 Occupation: @GUAROCC @    Subjective:  Injections   History of Present Illness: Jade Cardenas is a 66 y.o. female  Here for pain to the right knee and requesting a cortisone injection. In addition, she is having a fair amount of pain to the left knee is requesting a cortisone injection there if it is an option.  She has a history of osteoarthritis of bilateral knees and she has done well with Hyalgan injection. She is due for repeat Visco supplementation and it took a while for that to be approved. She has been approved for Euflex of now but those are scheduled to start sometime in first second and third week of August 2018. In the meanwhile her knees are hurting her bad enough where she rates her discomfort in her right knee as a 9 on a scale of 0-10 and the left knee is a 7 on a scale of 0-10.  She also has a history of psoriatic arthritis. She is not here for follow-up on that but we discussed how she's doing with that and she is doing well. She takes tympany subcutaneous monthly. She is on methotrexate 0.4 ML's weekly Folic acid 1 mg daily. Adequate response with his dual therapy.  She also has a history of iron deficiency anemia. Dr. Irene Limbo is monitoring her for this. She also has fair amount of fatigue and she was having vitamin D deficiency in the past. She is requesting blood work today and she would like [place] to add the blood work that Dr. Irene Limbo wanted her to have done on today's blood draw. Where happy to do so and we will share the results with Dr. Irene Limbo .    Activities of Daily Living:  Patient reports morning stiffness for 15 minutes.   Patient Reports nocturnal pain.  Difficulty dressing/grooming: Denies Difficulty climbing stairs: Reports Difficulty getting out of  chair: Reports Difficulty using hands for taps, buttons, cutlery, and/or writing: Denies   No Rheumatology ROS completed.   PMFS History:  Patient Active Problem List   Diagnosis Date Noted  . 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 Fibromyalgia 05/17/2016  . Other psoriasis 02/18/2016  . High risk medication use 02/18/2016  . DJD (degenerative joint disease), cervical 02/18/2016  . Heel spur 09/29/2015  . Ingrown toenail 09/29/2015  . Iron deficiency anemia due to chronic blood loss 08/14/2015  . Foraminal stenosis of cervical region 07/30/2015  . Microcytic hypochromic anemia 05/22/2015  . Tailor's bunion, left 01/18/2015  . H/O hiatal hernia   . Irritable bowel syndrome (IBS) 03/01/2014  . Primary osteoarthritis involving multiple joints 12/15/2013  . Iron deficiency 12/15/2013  . Psoriatic arthritis ([place]) 12/15/2013  . History of foot fracture 12/15/2013  . Dermatitis 10/17/2013  . 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 Arthritis 05/31/2013  . 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 Skin lesion 12/09/2012  . Tinnitus 06/23/2012  . Scalp lesion 01/21/2012  . Headache(784.0) 01/21/2012  . Hx of psoriasis 01/21/2012  . Weight gain 01/21/2012  . Ankylosing spondylitis  possible 09/03/2011  . Hx of transfusion 09/03/2011  . S/P lumbar fusion 6 13  09/03/2011  . Lumbar stenosis with neurogenic claudication 08/05/2011  . LBP (low back pain) 01/17/2011  . ALLERGIC RHINITIS 02/19/2010  . GERD 02/19/2010  . ARTHRITIS 02/19/2010  . SPINAL STENOSIS, CERVICAL 02/19/2010  . OSTEOPENIA 02/19/2010  . WEIGHT GAIN 12/18/2008  . RASH AND OTHER NONSPECIFIC SKIN ERUPTION 12/07/2007  . IBS 04/28/2007  . PALPITATIONS 02/09/2007  . History of gastric ulcer 02/09/2007  . COLONIC POLYPS, HX OF 02/09/2007  . REDUCTION MAMMOPLASTY, HX OF 02/09/2007  . CHOLECYSTECTOMY, HX OF 02/09/2007  . Hyperlipidemia 12/29/2006  . Essential hypertension 12/29/2006    Past Medical  History:  Diagnosis Date  . Abnormal Pap smear    H/O  . Anemia    iron def  . Arthritis    rheumatoid and osteo now flt to be psoriateic   . Arthritis    psoreratic, osteoarthritis  . Broken toe   . Bruises easily   . CHOLECYSTECTOMY, HX OF   . Chronic back pain   . COLONIC POLYPS, HX OF 02/09/2007  . Cough, persistent 01/21/2012   poss from acei   . 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 Fibromyalgia    doesn't require meds  . GASTRIC ULCER, ACUTE, HEMORRHAGE, HX OF 02/09/2007  . GERD (gastroesophageal reflux disease)    takes Nexium daily  . Goiter   . H/O hiatal hernia   . Headache(784.0)    occasionally  . History of bronchitis   . Hx of seasonal allergies    takes Claritin daily  . HYPERLIPIDEMIA 12/29/2006   takes Lovastatin nightly  . HYPERTENSION 12/29/2006   takes Enalapril daily  . IBS 04/28/2007  . Insomnia    related to pain;takes Flexeril and Tylenol PM nightly  . Joint pain   . Joint swelling   . MENOPAUSAL DISORDER 12/07/2007  . Palpitations 02/09/2007  . PONV (postoperative nausea and vomiting)    NAUSEA  . REDUCTION MAMMOPLASTY, HX OF 02/09/2007  . Rheumatoid arthritis(714.0) 12/29/2006  . Right bundle branch block 02/09/2007  . S/P lumbar fusion 6 13 09/03/2011   L4 L5  posterior   . SYNCOPE 12/07/2007  . UNS ADVRS EFF OTH RX MEDICINAL\T\BIOLOGICAL SBSTNC 02/09/2007  . UTI (lower urinary tract infection)    hx of;saw urologist last yr and he told her she was over reacting and not a big deal   . WEIGHT GAIN 12/18/2008    Family History  Problem Relation Age of Onset  . Diabetes Mother   . Colon cancer Mother        x2  . Arthritis Mother   . Hypertension Mother   . Endometrial cancer Mother   . Stroke Father   . Heart disease Father   . Colon cancer Father   . Arthritis Brother   . Hypertension Brother   . Hyperlipidemia Brother   . Other Unknown        DISH brother  . Hypertension Maternal Grandmother   . Stroke Maternal Grandmother     . Infertility Brother   . Anesthesia problems Neg Hx   . Hypotension Neg Hx   . Malignant hyperthermia Neg Hx   . Pseudochol deficiency Neg Hx    Past Surgical History:  Procedure Laterality Date  . ABDOMINAL HYSTERECTOMY  1986   TAH/BSO  . BACK SURGERY  12+yrs ago   Synovial Cyst removal  . BACK SURGERY  6/13   lumbar L4-L5 fusion, lamenectomy  . blood transfused  6/13   transfused 2 units post op from  back surgery  . bone spur  6-28yrs ago   right ankle  . BREAST REDUCTION SURGERY     last 80's  . BUNIONECTOMY  10/03/14   right foot  . CERVICAL FUSION     2 12   . CHOLECYSTECTOMY  1996  . COLONOSCOPY    . ESOPHAGOGASTRODUODENOSCOPY ENDOSCOPY    . POSTERIOR CERVICAL LAMINECTOMY Left 07/30/2015   Procedure: Laminectomy and Foraminotomy - left - Cervical two -Cervical three;  Surgeon: Earnie Larsson, MD;  Location: [facility];  Service: Neurosurgery;  Laterality: Left;  . REDUCTION MAMMAPLASTY Bilateral 1989  . 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  No synovitis on examination  Investigation: No additional findings.   Imaging: No results found.  Speciality Comments: No specialty comments available.    Procedures:  Large Joint Inj Date/Time: 08/05/2016 10:38 AM Performed by: Eliezer Lofts Authorized by: Eliezer Lofts   Consent Given by:  Patient Site marked: the procedure site was marked   Timeout: prior to procedure the correct patient, procedure, and site was verified   Indications:  Pain and joint  swelling Location:  Knee Site:  R knee Prep: patient was prepped and draped in usual sterile fashion   Needle Size:  27 G Needle Length:  1.5 inches Approach:  Medial Ultrasound Guidance: No   Fluoroscopic Guidance: No   Arthrogram: No   Medications:  40 mg triamcinolone acetonide 40 MG/ML; 1.5 mL lidocaine (PF) 1 % Aspiration Attempted: Yes   Aspirate amount (mL):  0 Patient tolerance:  Patient tolerated the procedure well with no immediate complications  Right knee pain is described as a 9 on a scale of 0-10. Patient is scheduled to get Visco supplementation 09/30/2016. She needs some pain management today to bilateral knees and has him in today for cortisone injection in both knees. Large Joint Inj Date/Time: 08/05/2016 10:38 AM Performed by: Eliezer Lofts Authorized by: Eliezer Lofts   Consent Given by:  Patient Site marked: the procedure site was marked   Timeout: prior to procedure the correct patient, procedure, and site was verified   Indications:  Pain and joint swelling Location:  Knee Site:  L knee Prep: patient was prepped and draped in usual sterile fashion   Needle Size:  27 G Needle Length:  1.5 inches Approach:  Medial Ultrasound Guidance: No   Fluoroscopic Guidance: No   Arthrogram: No   Medications:  40 mg triamcinolone acetonide 40 MG/ML; 1.5 mL lidocaine (PF) 1 % Aspiration Attempted: Yes   Patient tolerance:  Patient tolerated the procedure well with no immediate complications  Right knee pain is described as a 7 on a scale of 0-10. Patient is scheduled to get Visco supplementation 09/30/2016. She needs some pain management today to bilateral knees and has him in today for cortisone injection in both knees.   Allergies: Erythromycin; Iodinated diagnostic agents; Ace inhibitors; Dilaudid [hydromorphone hcl]; Morphine and related; Keflex [cephalexin]; and Macrobid [nitrofurantoin]   Assessment /  Plan:     Visit Diagnoses: Psoriatic arthritis  ([place])  High risk medication use - on Simponi SubQ monthly;  MTX 10 mg sq, Folic acid // Jolee Ewing and [place] caused diarrhea) - Plan: CBC with Differential/Platelet, COMPLETE METABOLIC PANEL WITH GFR  Iron deficiency anemia due to chronic blood loss - Plan: Ferritin, Iron and TIBC, Vitamin B12, CANCELED: Ferritin, CANCELED: Iron and TIBC, CANCELED: Vitamin B12  B12 deficiency - Plan: Vitamin B12, CANCELED: Ferritin, CANCELED: Iron and TIBC, CANCELED: Vitamin B12  Chronic fatigue - Plan: VITAMIN D 25 Hydroxy (Vit-D Deficiency, Fractures), Ferritin, Iron and TIBC, Vitamin B12  Acute bilateral knee pain  Primary osteoarthritis of both knees   Bilateral knee OA and bilateral knee pain Please see procedure note for full details on cortisone injection given to each knee  She schedule for Visco supplementation (Euflexxa 3) to each knee and it's starting August 2018    Orders: Orders Placed This Encounter  Procedures  . Large Joint Injection/Arthrocentesis  . Large Joint Injection/Arthrocentesis  . Ferritin  . Iron and TIBC  . Vitamin B12   Meds ordered this encounter  Medications  . lidocaine (LIDODERM) 5 %    Sig: Place 1 patch onto the skin daily. Remove & Discard patch within 12 hours or as directed by MD    Dispense:  30 patch    Refill:  2    Face-to-face time spent with patient was 30 minutes. 50% of time was spent in counseling and coordination of care.  Follow-Up Instructions: Return for PsA,Ps, oa kj, kj pain, iron defic anemia.   Eliezer Lofts, PA-C  Note - This record has been created using [employer].  Chart creation errors have been sought, but may not always  have been located. Such creation errors do not reflect on  the standard of medical care.

## 2016-08-05 NOTE — Patient Instructions (Signed)

## 2016-08-05 NOTE — Telephone Encounter (Signed)
A prior authorization for Lidocaine 5% patches was submitted via Cover My Meds. Will update once we receive a response.   Katiejo Gilroy, [place], CPhT 2:21 PM

## 2016-08-06 LAB — VITAMIN D 25 HYDROXY (VIT D DEFICIENCY, FRACTURES): Vit D, 25-Hydroxy: 32 ng/mL (ref 30–100)

## 2016-08-06 LAB — VITAMIN B12: Vitamin B-12: 484 pg/mL (ref 200–1100)

## 2016-08-06 LAB — FERRITIN: Ferritin: 837 ng/mL — ABNORMAL HIGH (ref 20–288)

## 2016-08-06 NOTE — Telephone Encounter (Signed)
Received a fax from [facility] regarding a prior authorization DENIAL for Lidocaine 5% patches.    Reference [id number] Phone number:[phone]  Will send document to scan center.  Spoke to patient to update her. She voices understanding. She is questioning her lab results taken yesterday at her appointment. She says that her ferritin levels were extremely high. She would like to speak to someone. Will you reach out to her? Thank you.   Reena Borromeo, [place], CPhT   3:25 PM

## 2016-08-07 ENCOUNTER — Other Ambulatory Visit: Payer: Self-pay | Admitting: *Deleted

## 2016-08-07 ENCOUNTER — Telehealth: Payer: Self-pay | Admitting: Radiology

## 2016-08-07 ENCOUNTER — Encounter: Payer: Self-pay | Admitting: Hematology

## 2016-08-07 ENCOUNTER — Encounter: Payer: Self-pay | Admitting: Internal Medicine

## 2016-08-07 ENCOUNTER — Encounter: Payer: Self-pay | Admitting: Rheumatology

## 2016-08-07 DIAGNOSIS — I83893 Varicose veins of bilateral lower extremities with other complications: Secondary | ICD-10-CM

## 2016-08-07 NOTE — Telephone Encounter (Signed)
I called the patient and discuss her elevated ferritin levels with her. Advised her to discuss the abnormal results with her hematologist oncologist (we had originally placed order for her). I advised her to discuss the possibility of hemochromatosis with Dr. Irene Limbo. Patient is getting infusions from the hematologist oncologist. If she is unable to reach him, I have asked the patient to call me back. Patient is currently doing fine.

## 2016-08-07 NOTE — Telephone Encounter (Signed)
I have called patient to advise labs need to be discus with Dr Irene Limbo

## 2016-08-07 NOTE — Telephone Encounter (Signed)
Patient has questions about her Ferritin levels  Please advise

## 2016-08-10 ENCOUNTER — Encounter: Payer: Self-pay | Admitting: *Deleted

## 2016-08-27 DIAGNOSIS — Z800 Family history of malignant neoplasm of digestive organs: Secondary | ICD-10-CM | POA: Insufficient documentation

## 2016-08-28 DIAGNOSIS — E049 Nontoxic goiter, unspecified: Secondary | ICD-10-CM | POA: Insufficient documentation

## 2016-09-09 ENCOUNTER — Other Ambulatory Visit: Payer: Self-pay | Admitting: Rheumatology

## 2016-09-09 NOTE — Telephone Encounter (Addendum)
Last Visit: 08/05/16 Next Visit: 09/30/16  Labs 08/05/16 cbc/ cmp WNL PPD: 11/2015 Neg  Okay to refill per Dr. Estanislado Pandy

## 2016-09-21 ENCOUNTER — Encounter: Payer: Self-pay | Admitting: Vascular Surgery

## 2016-09-22 ENCOUNTER — Encounter: Payer: Self-pay | Admitting: Rheumatology

## 2016-09-22 ENCOUNTER — Telehealth: Payer: Self-pay | Admitting: Rheumatology

## 2016-09-22 NOTE — Telephone Encounter (Signed)
Patient unable to come in at 1:30pm. Patient fell 1week ago, and has pain since. Patient has been icing knee, and uses Voltaren Gel. Patient questions xraying, or MRI. Please call to advise.

## 2016-09-23 NOTE — Telephone Encounter (Signed)
Patient reached out through my chart. Responded to patient and advised her she would need an appointment for evaluation and to contact the office to schedule an appointment.

## 2016-09-23 NOTE — Telephone Encounter (Signed)
Please sch appointment

## 2016-09-23 NOTE — Telephone Encounter (Signed)
She will need xray first before MRI

## 2016-09-25 ENCOUNTER — Ambulatory Visit (INDEPENDENT_AMBULATORY_CARE_PROVIDER_SITE_OTHER): Payer: Self-pay

## 2016-09-25 ENCOUNTER — Encounter: Payer: Self-pay | Admitting: Rheumatology

## 2016-09-25 ENCOUNTER — Ambulatory Visit (INDEPENDENT_AMBULATORY_CARE_PROVIDER_SITE_OTHER): Payer: BLUE CROSS/BLUE SHIELD | Admitting: Rheumatology

## 2016-09-25 VITALS — BP 122/69 | HR 93 | Resp 12 | Ht 64.0 in | Wt 225.0 lb

## 2016-09-25 DIAGNOSIS — M25562 Pain in left knee: Secondary | ICD-10-CM

## 2016-09-25 DIAGNOSIS — L4050 Arthropathic psoriasis, unspecified: Secondary | ICD-10-CM | POA: Diagnosis not present

## 2016-09-25 DIAGNOSIS — M797 Fibromyalgia: Secondary | ICD-10-CM

## 2016-09-25 DIAGNOSIS — M170 Bilateral primary osteoarthritis of knee: Secondary | ICD-10-CM | POA: Diagnosis not present

## 2016-09-25 DIAGNOSIS — Z79899 Other long term (current) drug therapy: Secondary | ICD-10-CM

## 2016-09-25 MED ORDER — METHOTREXATE SODIUM CHEMO INJECTION 50 MG/2ML: 12.5000 mg | INTRAMUSCULAR | 0 refills | Status: DC

## 2016-09-25 NOTE — Progress Notes (Signed)
Office Visit Note  Patient: Jade Cardenas             Date of Birth: 1951-01-15           MRN: [id number]             PCP: [name], MD Referring: [name], MD Visit Date: 09/25/2016 Occupation: '@[email or web address]' @    Subjective:  No chief complaint on file. fell, left knee pain, Friday 13, in garden  History of Present Illness: Jade Cardenas is a 66 y.o. female  Her for left knee pain.  fell, left knee pain, Friday 13, in garden  Pt 's left knee buckled and pt fell No head injury.  After injury, no bruise, pt iced knee and it was doing well. Wearing brace when needed on one occasion but pt then started having some pain. Also, waking pt up from sleep. Used voltaren gel with minimal relief.  She also has a history of psoriatic arthritis. She is not here for follow-up on that but we discussed how she's doing with that and she is doing well. She takes tympany subcutaneous monthly. She is on methotrexate 0.4 ML's weekly Folic acid 1 mg daily. Adequate response with his dual therapy  History of  iron deficiency anemia and being monitored by Dr. Irene Limbo  Activities of Daily Living:  Patient reports morning stiffness for 2 hours.   Patient Reports nocturnal pain.  Difficulty dressing/grooming: Reports Difficulty climbing stairs: Reports Difficulty getting out of chair: Reports Difficulty using hands for taps, buttons, cutlery, and/or writing: Denies   Review of Systems  Constitutional: Negative for fatigue.  HENT: Negative for mouth sores and mouth dryness.   Eyes: Negative for dryness.  Respiratory: Negative for shortness of breath.   Gastrointestinal: Negative for constipation and diarrhea.  Musculoskeletal: Negative for myalgias and myalgias.  Skin: Negative for sensitivity to sunlight.  Psychiatric/Behavioral: Negative for decreased concentration and sleep disturbance.    PMFS History:  Patient Active Problem List   Diagnosis Date Noted  .  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 Fibromyalgia 05/17/2016  . Other psoriasis 02/18/2016  . High risk medication use 02/18/2016  . DJD (degenerative joint disease), cervical 02/18/2016  . Heel spur 09/29/2015  . Ingrown toenail 09/29/2015  . Iron deficiency anemia due to chronic blood loss 08/14/2015  . Foraminal stenosis of cervical region 07/30/2015  . Microcytic hypochromic anemia 05/22/2015  . Tailor's bunion, left 01/18/2015  . H/O hiatal hernia   . Irritable bowel syndrome (IBS) 03/01/2014  . Primary osteoarthritis involving multiple joints 12/15/2013  . Iron deficiency 12/15/2013  . Psoriatic arthritis ([place]) 12/15/2013  . History of foot fracture 12/15/2013  . Dermatitis 10/17/2013  . 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 Arthritis 05/31/2013  . 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 Skin lesion 12/09/2012  . Tinnitus 06/23/2012  . Scalp lesion 01/21/2012  . Headache(784.0) 01/21/2012  . Hx of psoriasis 01/21/2012  . Weight gain 01/21/2012  . Ankylosing spondylitis  possible 09/03/2011  . Hx of transfusion 09/03/2011  . S/P lumbar fusion 6 13 09/03/2011  . Lumbar stenosis with neurogenic claudication 08/05/2011  . LBP (low back pain) 01/17/2011  . ALLERGIC RHINITIS 02/19/2010  . GERD 02/19/2010  . ARTHRITIS 02/19/2010  . SPINAL STENOSIS, CERVICAL 02/19/2010  . OSTEOPENIA 02/19/2010  . WEIGHT GAIN 12/18/2008  . RASH AND OTHER NONSPECIFIC SKIN ERUPTION 12/07/2007  . IBS 04/28/2007  . PALPITATIONS 02/09/2007  . History of gastric ulcer  02/09/2007  . COLONIC POLYPS, HX OF 02/09/2007  . REDUCTION MAMMOPLASTY, HX OF 02/09/2007  . CHOLECYSTECTOMY, HX OF 02/09/2007  . Hyperlipidemia 12/29/2006  . Essential hypertension 12/29/2006    Past Medical History:  Diagnosis Date  . Abnormal Pap smear    H/O  . Anemia    iron def  . Arthritis    rheumatoid and osteo now flt to be psoriateic   . Arthritis    psoreratic, osteoarthritis  . Broken  toe   . Bruises easily   . CHOLECYSTECTOMY, HX OF   . Chronic back pain   . COLONIC POLYPS, HX OF 02/09/2007  . Cough, persistent 01/21/2012   poss from acei   . 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 Fibromyalgia    doesn't require meds  . GASTRIC ULCER, ACUTE, HEMORRHAGE, HX OF 02/09/2007  . GERD (gastroesophageal reflux disease)    takes Nexium daily  . Goiter   . H/O hiatal hernia   . Headache(784.0)    occasionally  . History of bronchitis   . Hx of seasonal allergies    takes Claritin daily  . HYPERLIPIDEMIA 12/29/2006   takes Lovastatin nightly  . HYPERTENSION 12/29/2006   takes Enalapril daily  . IBS 04/28/2007  . Insomnia    related to pain;takes Flexeril and Tylenol PM nightly  . Joint pain   . Joint swelling   . MENOPAUSAL DISORDER 12/07/2007  . Palpitations 02/09/2007  . PONV (postoperative nausea and vomiting)    NAUSEA  . REDUCTION MAMMOPLASTY, HX OF 02/09/2007  . Rheumatoid arthritis(714.0) 12/29/2006  . Right bundle branch block 02/09/2007  . S/P lumbar fusion 6 13 09/03/2011   L4 L5  posterior   . SYNCOPE 12/07/2007  . UNS ADVRS EFF OTH RX MEDICINAL\T\BIOLOGICAL SBSTNC 02/09/2007  . UTI (lower urinary tract infection)    hx of;saw urologist last yr and he told her she was over reacting and not a big deal   . WEIGHT GAIN 12/18/2008    Family History  Problem Relation Age of Onset  . Diabetes Mother   . Colon cancer Mother        x2  . Arthritis Mother   . Hypertension Mother   . Endometrial cancer Mother   . Stroke Father   . Heart disease Father   . Colon cancer Father   . Arthritis Brother   . Hypertension Brother   . Hyperlipidemia Brother   . Other Unknown        DISH brother  . Hypertension Maternal Grandmother   . Stroke Maternal Grandmother   . Infertility Brother   . Anesthesia problems Neg Hx   . Hypotension Neg Hx   . Malignant hyperthermia Neg Hx   . Pseudochol deficiency Neg Hx    Past Surgical History:  Procedure Laterality Date   . ABDOMINAL HYSTERECTOMY  1986   TAH/BSO  . BACK SURGERY  12+yrs ago   Synovial Cyst removal  . BACK SURGERY  6/13   lumbar L4-L5 fusion, lamenectomy  . blood transfused  6/13   transfused 2 units post op from back surgery  . bone spur  6-49yr ago   right ankle  . BREAST REDUCTION SURGERY     last 80's  . BUNIONECTOMY  10/03/14   right foot  . CERVICAL FUSION     2 12   . CHOLECYSTECTOMY  1996  . COLONOSCOPY    . ESOPHAGOGASTRODUODENOSCOPY ENDOSCOPY    . POSTERIOR CERVICAL LAMINECTOMY Left 07/30/2015  Procedure: Laminectomy and Foraminotomy - left - Cervical two -Cervical three;  Surgeon: Earnie Larsson, MD;  Location: [facility];  Service: Neurosurgery;  Laterality: Left;  . REDUCTION MAMMAPLASTY Bilateral 1989  . 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 She appears well-developed and well-nourished.  HENT:  Head: Normocephalic and atraumatic.  Eyes: Pupils are equal, round, and reactive to light. EOM are normal.  Cardiovascular: Normal rate, regular rhythm and normal heart sounds.  Exam reveals no gallop and no friction rub.   No murmur heard. Pulmonary/Chest: Effort normal and breath sounds normal. She has no wheezes. She has no rales.  Abdominal: Soft. Bowel sounds are normal. She exhibits no distension. There is no tenderness. There is no guarding. No hernia.  Musculoskeletal: Normal range of motion. She exhibits no edema, tenderness or deformity.  Lymphadenopathy:    She has no cervical adenopathy.  Neurological: She is alert and oriented to person, place, and time.  Coordination normal.  Skin: Skin is warm and dry. Capillary refill takes less than 2 seconds. No rash noted.  Psychiatric: She has a normal mood and affect. Her behavior is normal.  Nursing note and vitals reviewed.    Musculoskeletal Exam:  Full range of motion of all joints (left) Grip strength is equal and strong bilaterally For myalgia tender points are absent  CDAI Exam: CDAI Homunculus Exam:   Tenderness:  LLE: tibiofemoral  Joint Counts:  CDAI Tender Joint count: 1 CDAI Swollen Joint count: 0  Global Assessments:  Patient Global Assessment: 10 Provider Global Assessment: 10  CDAI Calculated Score: 21    Investigation: No additional findings. Office Visit on 08/05/2016  Component Date Value Ref Range Status  . WBC 08/05/2016 5.4  3.8 - 10.8 K/uL Final  . RBC 08/05/2016 4.26  3.80 - 5.10 MIL/uL Final  . Hemoglobin 08/05/2016 14.5  11.7 - 15.5 g/dL Final  . HCT 08/05/2016 41.7  35.0 - 45.0 % Final  . MCV 08/05/2016 97.9  80.0 - 100.0 fL Final  . MCH 08/05/2016 34.0* 27.0 - 33.0 pg Final  . MCHC 08/05/2016 34.8  32.0 - 36.0 g/dL Final  . RDW 08/05/2016 15.1* 11.0 - 15.0 % Final  . Platelets 08/05/2016 308  140 - 400 K/uL Final  . MPV 08/05/2016 9.1  7.5 - 12.5 fL Final  . Neutro Abs 08/05/2016 3402  1,500 - 7,800 cells/uL Final  . Lymphs Abs 08/05/2016 1512  850 - 3,900 cells/uL Final  . Monocytes Absolute 08/05/2016 378  200 - 950 cells/uL Final  . Eosinophils Absolute 08/05/2016 54  15 - 500 cells/uL Final  . Basophils Absolute 08/05/2016 54  0 - 200 cells/uL Final  . Neutrophils Relative % 08/05/2016 63  % Final  . Lymphocytes Relative 08/05/2016 28  % Final  . Monocytes Relative 08/05/2016 7  % Final  . Eosinophils Relative 08/05/2016 1  % Final  . Basophils Relative 08/05/2016 1  % Final  . Smear Review  08/05/2016 Criteria for review not met   Final  . Sodium 08/05/2016 142  135 - 146 mmol/L Final  . Potassium 08/05/2016 4.3  3.5 - 5.3 mmol/L Final  .  Chloride 08/05/2016 104  98 - 110 mmol/L Final  . CO2 08/05/2016 26  20 - 31 mmol/L Final  . Glucose, Bld 08/05/2016 90  65 - 99 mg/dL Final  . BUN 08/05/2016 16  7 - 25 mg/dL Final  . Creat 08/05/2016 0.72  0.50 - 0.99 mg/dL Final   Comment:   For patients > or = 64 years of age: The upper reference limit for Creatinine is approximately 13% higher for people identified as African-American.     . Total Bilirubin 08/05/2016 0.8  0.2 - 1.2 mg/dL Final  . Alkaline Phosphatase 08/05/2016 87  33 - 130 U/L Final  . AST 08/05/2016 20  10 - 35 U/L Final  . ALT 08/05/2016 26  6 - 29 U/L Final  . Total Protein 08/05/2016 7.7  6.1 - 8.1 g/dL Final  . Albumin 08/05/2016 4.7  3.6 - 5.1 g/dL Final  . Calcium 08/05/2016 9.8  8.6 - 10.4 mg/dL Final  . GFR, Est African American 08/05/2016 >89  >=60 mL/min Final  . GFR, Est Non African American 08/05/2016 89  >=60 mL/min Final  . Vit D, 25-Hydroxy 08/05/2016 32  30 - 100 ng/mL Final   Comment: Vitamin D Status           25-OH Vitamin D        Deficiency                <20 ng/mL        Insufficiency         20 - 29 ng/mL        Optimal             > or = 30 ng/mL   For 25-OH Vitamin D testing on patients on D2-supplementation and patients for whom quantitation of D2 and D3 fractions is required, the [facility] 25-OH VIT D, (D2,D3), LC/MS/MS is recommended: order code [id number] (patients > 2 yrs).   . Ferritin 08/05/2016 837* 20 - 288 ng/mL Final  . Iron 08/05/2016 125  45 - 160 ug/dL Final  . UIBC 08/05/2016 201  ug/dL Final  . TIBC 08/05/2016 326  250 - 450 ug/dL Final  . %SAT 08/05/2016 38  11 - 50 % Final  . Vitamin B-12 08/05/2016 484  200 - 1,100 pg/mL Final  Appointment on 05/07/2016  Component Date Value Ref Range Status  . WBC 05/07/2016 5.9  3.9 - 10.3 10e3/uL Final  . NEUT# 05/07/2016 3.9  1.5 - 6.5 10e3/uL Final  . HGB 05/07/2016 13.7  11.6 - 15.9 g/dL Final  . HCT 05/07/2016 39.3  34.8 - 46.6 % Final  . Platelets 05/07/2016 291  145  - 400 10e3/uL Final  . MCV 05/07/2016 92.9  79.5 - 101.0 fL Final  . MCH 05/07/2016 32.4  25.1 - 34.0 pg Final  . MCHC 05/07/2016 34.9  31.5 - 36.0 g/dL Final  . RBC 05/07/2016 4.23  3.70 - 5.45 10e6/uL Final  . RDW 05/07/2016 13.8  11.2 - 14.5 % Final  . lymph# 05/07/2016 1.8  0.9 - 3.3 10e3/uL Final  . MONO# 05/07/2016 0.3  0.1 - 0.9 10e3/uL Final  . Eosinophils Absolute 05/07/2016 0.0  0.0 - 0.5 10e3/uL Final  . Basophils Absolute 05/07/2016 0.0  0.0 - 0.1 10e3/uL Final  .  NEUT% 05/07/2016 64.9  38.4 - 76.8 % Final  . LYMPH% 05/07/2016 29.5  14.0 - 49.7 % Final  . MONO% 05/07/2016 4.4  0.0 - 14.0 % Final  . EOS% 05/07/2016 0.7  0.0 - 7.0 % Final  . BASO% 05/07/2016 0.5  0.0 - 2.0 % Final  . Retic % 05/07/2016 2.48* 0.70 - 2.10 % Final  . Retic Ct Abs 05/07/2016 104.90* 33.70 - 90.70 10e3/uL Final  . Immature Retic Fract 05/07/2016 13.60* 1.60 - 10.00 % Final  . Sodium 05/07/2016 141  136 - 145 mEq/L Final  . Potassium 05/07/2016 4.1  3.5 - 5.1 mEq/L Final  . Chloride 05/07/2016 105  98 - 109 mEq/L Final  . CO2 05/07/2016 26  22 - 29 mEq/L Final  . Glucose 05/07/2016 135  70 - 140 mg/dl Final   Glucose reference range is for nonfasting patients. Fasting glucose reference range is 70- 100.  Marland Kitchen BUN 05/07/2016 20.4  7.0 - 26.0 mg/dL Final  . Creatinine 05/07/2016 0.8  0.6 - 1.1 mg/dL Final  . Total Bilirubin 05/07/2016 0.63  0.20 - 1.20 mg/dL Final  . Alkaline Phosphatase 05/07/2016 107  40 - 150 U/L Final  . AST 05/07/2016 18  5 - 34 U/L Final  . ALT 05/07/2016 21  0 - 55 U/L Final  . Total Protein 05/07/2016 7.9  6.4 - 8.3 g/dL Final  . Albumin 05/07/2016 4.2  3.5 - 5.0 g/dL Final  . Calcium 05/07/2016 9.8  8.4 - 10.4 mg/dL Final  . Anion Gap 05/07/2016 10  3 - 11 mEq/L Final  . EGFR 05/07/2016 75* >90 ml/min/1.73 m2 Final   eGFR is calculated using the CKD-EPI Creatinine Equation (2009)  . Ferritin 05/07/2016 148  9 - 269 ng/ml Final     Imaging: Xr Knee 3 View  Left  Result Date: 09/25/2016 Left knee x-ray 3 views #1: Moderate to severe medial compartment narrowing #2: Lateral patellar tracking #3: No CPPD #4: Moderate patellofemoral joint space narrowing Impression: #1 moderate to severe osteoarthritis of the left knee with no obvious fracture or other abnormality. #2: Status post 14 days from injuring left knee, patient's pain can go as high as 10 on a scale of 0-10 and may benefit from an MRI if no improvement in the next one week after getting a cortisone injection today   Speciality Comments: No specialty comments available.    Procedures:  No procedures performed Allergies: Erythromycin; Iodinated diagnostic agents; Ace inhibitors; Dilaudid [hydromorphone hcl]; Morphine and related; Keflex [cephalexin]; and Macrobid [nitrofurantoin]   Assessment / Plan:     Visit Diagnoses: Acute pain of left knee - Plan: XR KNEE 3 VIEW LEFT  Primary osteoarthritis of both knees  Psoriatic arthritis ([place])  High risk medication use  Fibromyalgia   Plan: #1: Patient was worked in today for acute left knee pain status post injury. Patient has a history of OA of both knees and actually she is scheduled for Visco supplementation in about a week or 2. She fell in her front yard onto her left knee. Rather than getting better, it's gradually worsened. She is worried that it hasn't improved despite falling 2 weeks ago.  #2: X-ray of left knee shows no obvious abnormality other than osteoarthritis of the left knee. She is only scheduled to get Visco supplementation in the next week or 2. Since the x-rays are normal, she would benefit from MRI. Today, I'll give her cortisone injection to the left knee to see if  this will help her with her left knee pain. If her pain subsides adequately, we may not have to proceed with the MRI. If she continues to have pain, I would recommend MRI of the left knee  #3: OA of bilateral knees Patient is due for Visco  supplementation in the next one or 2 weeks. She may not be able to get Visco in the left knee if she continues to have pain and we need to explore the true cause of her left knee pain (meniscal tear, etc.).  #4: Psoriatic arthritis and psoriasis. Well-controlled. Taking symponi 50 mg every month Using methotrexate 0.5 mL's every week Folic acid 2 mg daily Adequate control  #5: Return to clinic as scheduled for follow-up as well as Visco supplementation.   Orders: Orders Placed This Encounter  Procedures  . XR KNEE 3 VIEW LEFT   Meds ordered this encounter  Medications  . methotrexate 50 MG/2ML injection    Sig: Inject 0.5 mLs (12.5 mg total) into the skin once a week. INJECT 0.5 MLS (12.5 MG TOTAL) INTO THE SKIN ONCE.    Dispense:  6 mL    Refill:  0    Please dispense 90 day supply with preservatives only;    Order Specific Question:   Supervising Provider    Answer:   Bo Merino [[id number]]    Face-to-face time spent with patient was 30 minutes. Greater than 50% of time was spent in counseling and coordination of care.  Follow-Up Instructions: Return as scheduled., for oakj, left knee pain s/p trauma,.   Eliezer Lofts, PA-C  Note - This record has been created using [employer].  Chart creation errors have been sought, but may not always  have been located. Such creation errors do not reflect on  the standard of medical care.

## 2016-09-27 ENCOUNTER — Encounter: Payer: Self-pay | Admitting: Rheumatology

## 2016-09-28 NOTE — Telephone Encounter (Signed)
Please sch MRI of her left knee to r/o internal derangement.

## 2016-09-29 ENCOUNTER — Telehealth: Payer: Self-pay | Admitting: *Deleted

## 2016-09-29 DIAGNOSIS — M25562 Pain in left knee: Principal | ICD-10-CM

## 2016-09-29 DIAGNOSIS — G8929 Other chronic pain: Secondary | ICD-10-CM

## 2016-09-29 NOTE — Telephone Encounter (Signed)
MRI order placed.

## 2016-09-30 ENCOUNTER — Ambulatory Visit (INDEPENDENT_AMBULATORY_CARE_PROVIDER_SITE_OTHER): Payer: BLUE CROSS/BLUE SHIELD | Admitting: Rheumatology

## 2016-09-30 ENCOUNTER — Encounter: Payer: BLUE CROSS/BLUE SHIELD | Admitting: Vascular Surgery

## 2016-09-30 ENCOUNTER — Encounter (HOSPITAL_COMMUNITY): Payer: BLUE CROSS/BLUE SHIELD

## 2016-09-30 DIAGNOSIS — M1711 Unilateral primary osteoarthritis, right knee: Secondary | ICD-10-CM

## 2016-09-30 DIAGNOSIS — M170 Bilateral primary osteoarthritis of knee: Secondary | ICD-10-CM

## 2016-09-30 MED ORDER — SODIUM HYALURONATE (VISCOSUP) 20 MG/2ML IX SOSY
20.0000 mg | PREFILLED_SYRINGE | INTRA_ARTICULAR | Status: AC | PRN
  Administered 2016-09-30: 20 mg via INTRA_ARTICULAR

## 2016-09-30 MED ORDER — LIDOCAINE HCL 1 % IJ SOLN
1.5000 mL | INTRAMUSCULAR | Status: AC | PRN
  Administered 2016-09-30: 1.5 mL

## 2016-09-30 NOTE — Progress Notes (Incomplete)
   Procedure Note  Patient: Jade Cardenas             Date of Birth: [birth year]           MRN: [id number]             Visit Date: 09/30/2016  Procedures: Visit Diagnoses: Bilateral primary osteoarthritis of knee - Plan: Large Joint Injection/Arthrocentesis, Large Joint Injection/Arthrocentesis Euflexxa No. 1 2 right knee joint Large Joint Inj Date/Time: 09/30/2016 1:27 PM Performed by: Bo Merino Authorized by: Bo Merino   Consent Given by:  Patient Site marked: the procedure site was marked   Timeout: prior to procedure the correct patient, procedure, and site was verified   Indications:  Pain and joint swelling Location:  Knee Site:  R knee Prep: patient was prepped and draped in usual sterile fashion   Needle Size:  27 G Needle Length:  1.5 inches Approach:  Medial Ultrasound Guidance: No   Fluoroscopic Guidance: No   Arthrogram: No   Medications:  1.5 mL lidocaine 1 %; 20 mg Sodium Hyaluronate 20 MG/2ML Aspiration Attempted: Yes   Aspirate amount (mL):  0 Patient tolerance:  Patient tolerated the procedure well with no immediate complications  She continues to have pain and discomfort in her left knee joint despite having cortisone injection. She has MRI scheduled for her left knee joint. She is to call and make an appointment. If her MRI is unremarkable with O2 injections to both the joints that visit. Bo Merino, MD

## 2016-09-30 NOTE — Progress Notes (Deleted)
   Procedure Note  Patient: Jade Cardenas             Date of Birth: 12-23-1950           MRN: [id number]             Visit Date: 09/30/2016  Procedures: Visit Diagnoses: No diagnosis found. euflexxa #1 right knee Large Joint Inj Date/Time: 09/30/2016 1:18 PM Performed by: Bo Merino Authorized by: Bo Merino   Consent Given by:  Patient Site marked: the procedure site was marked   Timeout: prior to procedure the correct patient, procedure, and site was verified   Indications:  Pain Location:  Knee Site:  R knee Prep: patient was prepped and draped in usual sterile fashion   Needle Size:  27 G Needle Length:  1.5 inches Ultrasound Guidance: No   Fluoroscopic Guidance: No   Arthrogram: No   Medications:  20 mg Sodium Hyaluronate 20 MG/2ML; 1.5 mL lidocaine 2 % Aspiration Attempted: Yes   Patient tolerance:  Patient tolerated the procedure well with no immediate complications

## 2016-09-30 NOTE — Progress Notes (Signed)
   Procedure Note  Patient: Jade Cardenas             Date of Birth: January 13, 1951           MRN: [id number]             Visit Date: 09/30/2016  Procedures: Visit Diagnoses: Bilateral primary osteoarthritis of knee - Plan: CANCELED: Large Joint Injection/Arthrocentesis Euflexxa No. 1  right knee joint Large Joint Inj Date/Time: 09/30/2016 1:27 PM Performed by: Bo Merino Authorized by: Bo Merino   Consent Given by:  Patient Site marked: the procedure site was marked   Timeout: prior to procedure the correct patient, procedure, and site was verified   Indications:  Pain and joint swelling Location:  Knee Site:  R knee Prep: patient was prepped and draped in usual sterile fashion   Needle Size:  27 G Needle Length:  1.5 inches Approach:  Medial Ultrasound Guidance: No   Fluoroscopic Guidance: No   Arthrogram: No   Medications:  1.5 mL lidocaine 1 %; 20 mg Sodium Hyaluronate 20 MG/2ML Aspiration Attempted: Yes   Aspirate amount (mL):  0 Patient tolerance:  Patient tolerated the procedure well with no immediate complications  She continues to have pain and discomfort in her left knee joint despite having cortisone injection. She has MRI scheduled for her left knee joint. She is to call and make an appointment. If her MRI is unremarkable with O2 injections to both the joints that visit. Bo Merino, MD

## 2016-10-01 ENCOUNTER — Encounter: Payer: Self-pay | Admitting: Vascular Surgery

## 2016-10-01 ENCOUNTER — Ambulatory Visit (INDEPENDENT_AMBULATORY_CARE_PROVIDER_SITE_OTHER): Payer: BLUE CROSS/BLUE SHIELD | Admitting: Vascular Surgery

## 2016-10-01 ENCOUNTER — Ambulatory Visit (HOSPITAL_COMMUNITY)
Admission: RE | Admit: 2016-10-01 | Discharge: 2016-10-01 | Disposition: A | Discharge status: Home / Self Care | Payer: BLUE CROSS/BLUE SHIELD | Source: Ambulatory Visit | Attending: Vascular Surgery | Admitting: Vascular Surgery

## 2016-10-01 VITALS — BP 145/86 | HR 93 | Temp 97.3°F | Resp 20 | Ht 64.0 in | Wt 221.0 lb

## 2016-10-01 DIAGNOSIS — M25562 Pain in left knee: Secondary | ICD-10-CM | POA: Diagnosis present

## 2016-10-01 DIAGNOSIS — I83893 Varicose veins of bilateral lower extremities with other complications: Secondary | ICD-10-CM | POA: Diagnosis not present

## 2016-10-01 DIAGNOSIS — X58XXXA Exposure to other specified factors, initial encounter: Secondary | ICD-10-CM | POA: Insufficient documentation

## 2016-10-01 DIAGNOSIS — I83813 Varicose veins of bilateral lower extremities with pain: Secondary | ICD-10-CM | POA: Diagnosis not present

## 2016-10-01 DIAGNOSIS — M2242 Chondromalacia patellae, left knee: Secondary | ICD-10-CM | POA: Insufficient documentation

## 2016-10-01 DIAGNOSIS — G8929 Other chronic pain: Secondary | ICD-10-CM | POA: Diagnosis present

## 2016-10-01 DIAGNOSIS — I872 Venous insufficiency (chronic) (peripheral): Secondary | ICD-10-CM | POA: Insufficient documentation

## 2016-10-01 DIAGNOSIS — S76112A Strain of left quadriceps muscle, fascia and tendon, initial encounter: Secondary | ICD-10-CM | POA: Diagnosis not present

## 2016-10-01 NOTE — Progress Notes (Signed)
Patient name: Jade Cardenas MRN: [id number] DOB: 1950-04-18 Sex: female  REASON FOR CONSULT: Symptomatic varicose veins with pain  HPI: Jade Cardenas is a 66 y.o. female with bilateral lower extremity painful varicose veins. She has had varicose veins for several years and they have progressively gotten worse. She experiences pain and aching after being on her feet all day long. It is primarily in the posterior calf. She has worn some light compression in the past but did not really have relief of her symptoms. She takes Tylenol for her arthritis but this is not really help her varicose vein pain much. She denies any prior ulcerations or nonhealing wounds. She denies any prior DVT. She does have a family history of varicose veins in her mother. Other medical problems include degenerative arthritis hypertension hyperlipidemia history of a gastric ulcer.  Past Medical History:  Diagnosis Date  . Abnormal Pap smear    H/O  . Anemia    iron def  . Arthritis    rheumatoid and osteo now flt to be psoriateic   . Arthritis    psoreratic, osteoarthritis  . Broken toe   . Bruises easily   . CHOLECYSTECTOMY, HX OF   . Chronic back pain   . COLONIC POLYPS, HX OF 02/09/2007  . Cough, persistent 01/21/2012   poss from acei   . 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 Fibromyalgia    doesn't require meds  . GASTRIC ULCER, ACUTE, HEMORRHAGE, HX OF 02/09/2007  . GERD (gastroesophageal reflux disease)    takes Nexium daily  . Goiter   . H/O hiatal hernia   . Headache(784.0)    occasionally  . History of bronchitis   . Hx of seasonal allergies    takes Claritin daily  . HYPERLIPIDEMIA 12/29/2006   takes Lovastatin nightly  . HYPERTENSION 12/29/2006   takes Enalapril daily  . IBS 04/28/2007  . Insomnia    related to pain;takes Flexeril and Tylenol PM nightly  . Joint pain   . Joint swelling   . MENOPAUSAL DISORDER 12/07/2007  . Palpitations 02/09/2007  . PONV (postoperative nausea  and vomiting)    NAUSEA  . REDUCTION MAMMOPLASTY, HX OF 02/09/2007  . Rheumatoid arthritis(714.0) 12/29/2006  . Right bundle branch block 02/09/2007  . S/P lumbar fusion 6 13 09/03/2011   L4 L5  posterior   . SYNCOPE 12/07/2007  . UNS ADVRS EFF OTH RX MEDICINAL\T\BIOLOGICAL SBSTNC 02/09/2007  . UTI (lower urinary tract infection)    hx of;saw urologist last yr and he told her she was over reacting and not a big deal   . WEIGHT GAIN 12/18/2008   Past Surgical History:  Procedure Laterality Date  . ABDOMINAL HYSTERECTOMY  1986   TAH/BSO  . BACK SURGERY  12+yrs ago   Synovial Cyst removal  . BACK SURGERY  6/13   lumbar L4-L5 fusion, lamenectomy  . blood transfused  6/13   transfused 2 units post op from back surgery  . bone spur  6-50yr ago   right ankle  . BREAST REDUCTION SURGERY     last 80's  . BUNIONECTOMY  10/03/14   right foot  . CERVICAL FUSION     2 12   . CHOLECYSTECTOMY  1996  . COLONOSCOPY    . ESOPHAGOGASTRODUODENOSCOPY ENDOSCOPY    . POSTERIOR CERVICAL LAMINECTOMY Left 07/30/2015   Procedure: Laminectomy and Foraminotomy - left - Cervical two -Cervical three;  Surgeon: HEarnie Larsson MD;  [facility];  Service: Neurosurgery;  Laterality: Left;  . REDUCTION MAMMAPLASTY Bilateral 1989  . TONSILLECTOMY     as a child    Family History  Problem Relation Age of Onset  . Diabetes Mother   . Colon cancer Mother        x2  . Arthritis Mother   . Hypertension Mother   . Endometrial cancer Mother   . Stroke Father   . Heart disease Father   . Colon cancer Father   . Arthritis Brother   . Hypertension Brother   . Hyperlipidemia Brother   . Other Unknown        DISH brother  . Hypertension Maternal Grandmother   . Stroke Maternal Grandmother   . Infertility Brother   . Anesthesia problems Neg Hx   . Hypotension Neg Hx   . Malignant hyperthermia Neg Hx   . Pseudochol deficiency Neg Hx     SOCIAL HISTORY: Social History   Social History  . Marital  status: Widowed    Spouse name: N/A  . Number of children: N/A  . Years of education: N/A   Occupational History  . Not on file.   Social History Main Topics  . Smoking status: Never Smoker  . Smokeless tobacco: Never Used  . Alcohol use No  . Drug use: No  . Sexual activity: No     Comment: TAH/BSO   Other Topics Concern  . 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 Erythromycin Hives, Swelling and Other (See Comments)    All Mycins; throat swells shut and she gets "really hot"  . Iodinated Diagnostic Agents Swelling    CT contrast; throat swelling, difficulty breathing.  In [place] "many years ago."  . Ace Inhibitors Cough  . Dilaudid [Hydromorphone Hcl]   . Morphine And Related   . Keflex [Cephalexin] Hives    hives  . Macrobid [Nitrofurantoin] Other (See Comments)    dizziness    Current Outpatient Prescriptions  Medication Sig Dispense Refill  . acetaminophen (TYLENOL) 650 MG CR tablet Take 650 mg by mouth as needed for pain.    Marland Kitchen alosetron (LOTRONEX) 0.5 MG tablet Take 0.5 mg by mouth 2 (two) times daily.  0  . amLODipine (NORVASC) 5 MG tablet TAKE 1 TABLET (5 MG TOTAL) BY MOUTH DAILY. 90 tablet 3  . B-D ALLERGY SYRINGE 1CC/28G 28G X 1/2" 1 ML MISC USE AS DIRECTED ONCE WEEKLY  4  . Cholecalciferol (VITAMIN D) 2000 units tablet Take 2,000 Units by mouth daily.    . clobetasol ointment (TEMOVATE) 2.37 % Apply 1 application topically 2 (two) times daily. 45 g 1  . Diphenhydramine-APAP, sleep, (TYLENOL PM EXTRA STRENGTH PO) Take 2 tablets by mouth at bedtime as needed. For sleep    . esomeprazole (NEXIUM) 40 MG capsule Take 1 capsule (40 mg total) by mouth daily before breakfast. (Patient taking differently: Take 80 mg by mouth daily before breakfast. ) 30 capsule 11  . folic acid (FOLVITE) 1 MG  tablet Take 1 tablet (1 mg total) by mouth daily. 90 tablet 4  . lidocaine (LIDODERM) 5 % Place 1 patch onto the skin daily. Remove & Discard patch within 12 hours or as directed by MD 30 patch 2  . losartan (COZAAR) 100  MG tablet TAKE 1 TABLET (100 MG TOTAL) BY MOUTH DAILY. 90 tablet 3  . lovastatin (MEVACOR) 20 MG tablet TAKE 1 TABLET (20 MG TOTAL) BY MOUTH AT BEDTIME. 90 tablet 1  . methotrexate 50 MG/2ML injection Inject 0.5 mLs (12.5 mg total) into the skin once a week. INJECT 0.5 MLS (12.5 MG TOTAL) INTO THE SKIN ONCE. 6 mL 0  . Multiple Vitamin (MULTIVITAMIN) tablet Take 1 tablet by mouth daily.      Marland Kitchen SIMPONI 50 MG/0.5ML SOAJ INJECT '50MG'$  (1 SYRINGE) UNDER THE SKIN ONCE MONTHLY 0.5 mL 0  . Tuberculin-Allergy Syringes 27G X 1/2" 1 ML KIT Inject 1 Syringe into the skin once a week. If syringe size greater than 40m, please call me at my office. 12 each 4   No current facility-administered medications for this visit.     ROS:   General:  No weight loss, Fever, chills  HEENT: No recent headaches, no nasal bleeding, no visual changes, no sore throat  Neurologic: No dizziness, blackouts, seizures. No recent symptoms of stroke or mini- stroke. No recent episodes of slurred speech, or temporary blindness.  Cardiac: No recent episodes of chest pain/pressure, no shortness of breath at rest.  No shortness of breath with exertion.  Denies history of atrial fibrillation or irregular heartbeat  Vascular: No history of rest pain in feet.  No history of claudication.  No history of non-healing ulcer, No history of DVT   Pulmonary: No home oxygen, no productive cough, no hemoptysis,  No asthma or wheezing  Musculoskeletal:  '[X]'$  Arthritis, '[X]'$  Low back pain,  '[X]'$  Joint pain  Hematologic:No history of hypercoagulable state.  No history of easy bleeding.  No history of anemia  Gastrointestinal: No hematochezia or melena,  No gastroesophageal reflux, no trouble swallowing  Urinary: '[ ]'$  chronic  Kidney disease, '[ ]'$  on HD - '[ ]'$  MWF or '[ ]'$  TTHS, '[ ]'$  Burning with urination, '[ ]'$  Frequent urination, '[ ]'$  Difficulty urinating;   Skin: No rashes  Psychological: No history of anxiety,  No history of depression   Physical Examination Vitals:   10/01/16 1524  BP: (!) 145/86  Pulse: 93  Resp: 20  Temp: (!) 97.3 F (36.3 C)  TempSrc: Oral  SpO2: 94%  Weight: 221 lb (100.2 kg)  Height: '5\' 4"'$  (1.626 m)    General:  Alert and oriented, no acute distress HEENT: Normal Neck: No bruit or JVD Pulmonary: Clear to auscultation bilaterally Cardiac: Regular Rate and Rhythm without murmur Abdomen: Soft, non-tender, non-distended, no mass, Obese  Skin: No rash, scattered reticular and spider-type varicosity disease diffusely throughout the anterior thigh bilaterally, palpable visible varicosities posterior calf ranging from 3-4 mm diameter bilaterally. These are tender to palpation. Slightly prominent greater saphenous vein along the medial course of the cath 3-4 mm diameter. Extremity Pulses:  2+ radial, brachial, femoral, dorsalis pedis, posterior tibial pulses bilaterally Musculoskeletal: No deformity or edema  Neurologic: Upper and lower extremity motor 5/5 and symmetric  DATA:  Patient had a venous reflux exam today. This showed evidence of reflux in the lesser saphenous greater saphenous and common femoral vein. The lesser saphenous was incompetent bilaterally. The greater saphenous was incompetent on the left. There was reflux of the saphenofemoral junction on the left. Lesser saphenous vein was 3-4 mm on the right 3-5 mm on the left. Greater saphenous was 4 mm in the mid leg bilaterally distending up to 7 mm at the junction.  ASSESSMENT:  Bilateral lower extremity leg varicose  veins with pain CEAP class II  PLAN:  Bilateral lower Sherman a compression stockings were prescribed with the patient today. She was also told to try to lose weight. She will continue to use her Tylenol for pain  but also to use leg elevation to improve her symptoms. She'll return for follow-up in 3 months time for consideration of laser ablation.   Ruta Hinds, MD [facility] Office: [phone] Pager: [phone]

## 2016-10-02 ENCOUNTER — Ambulatory Visit (HOSPITAL_COMMUNITY)
Admission: RE | Admit: 2016-10-02 | Discharge: 2016-10-02 | Disposition: A | Discharge status: Home / Self Care | Payer: BLUE CROSS/BLUE SHIELD | Source: Ambulatory Visit | Attending: Rheumatology | Admitting: Rheumatology

## 2016-10-02 DIAGNOSIS — M25562 Pain in left knee: Principal | ICD-10-CM

## 2016-10-02 DIAGNOSIS — S76112A Strain of left quadriceps muscle, fascia and tendon, initial encounter: Secondary | ICD-10-CM | POA: Diagnosis not present

## 2016-10-02 DIAGNOSIS — G8929 Other chronic pain: Secondary | ICD-10-CM

## 2016-10-02 NOTE — Progress Notes (Signed)
MRI shows muscle strain. She may try eyes and Voltaren gel. If she has persistent problems she may follow up with orthopedics.

## 2016-10-05 ENCOUNTER — Other Ambulatory Visit: Payer: Self-pay | Admitting: *Deleted

## 2016-10-05 MED ORDER — METHOTREXATE SODIUM CHEMO INJECTION 50 MG/2ML: 12.5000 mg | INTRAMUSCULAR | 0 refills | Status: DC

## 2016-10-05 NOTE — Progress Notes (Signed)
   Procedure Note  Patient: Jade Cardenas             Date of Birth: 1950/04/20           MRN: [id number]             Visit Date: 10/07/2016  Procedures: Visit Diagnoses: Bilateral primary osteoarthritis of knee   Left knee joint MRI results were reviewed. Euflexxa #2 right knee #1 left knee   Large Joint Inj Date/Time: 10/07/2016 9:56 PM Performed by: Bo Merino Authorized by: Bo Merino   Consent Given by:  Patient Site marked: the procedure site was marked   Timeout: prior to procedure the correct patient, procedure, and site was verified   Indications:  Pain and joint swelling Location:  Knee Site:  R knee Prep: patient was prepped and draped in usual sterile fashion   Needle Size:  27 G Needle Length:  1.5 inches Approach:  Medial Ultrasound Guidance: No   Fluoroscopic Guidance: No   Arthrogram: No   Medications:  1.5 mL lidocaine 1 %; 20 mg Sodium Hyaluronate 20 MG/2ML Aspiration Attempted: Yes   Aspirate amount (mL):  0 Patient tolerance:  Patient tolerated the procedure well with no immediate complications Large Joint Inj Date/Time: 10/07/2016 9:57 PM Performed by: Bo Merino Authorized by: Bo Merino   Consent Given by:  Patient Site marked: the procedure site was marked   Timeout: prior to procedure the correct patient, procedure, and site was verified   Indications:  Pain and joint swelling Location:  Knee Site:  L knee Prep: patient was prepped and draped in usual sterile fashion   Needle Size:  27 G Needle Length:  1.5 inches Approach:  Medial Ultrasound Guidance: No   Fluoroscopic Guidance: No   Arthrogram: No   Medications:  1.5 mL lidocaine 1 %; 20 mg Sodium Hyaluronate 20 MG/2ML Aspiration Attempted: Yes   Aspirate amount (mL):  0 Patient tolerance:  Patient tolerated the procedure well with no immediate complications    Bo Merino, MD

## 2016-10-05 NOTE — Telephone Encounter (Signed)
Received fax from [facility] stating they no longer provide mail order service for this patient. Patient states she usually picks up prescription from local pharmacy. Prescription resent to local pharmacy.

## 2016-10-06 ENCOUNTER — Other Ambulatory Visit: Payer: Self-pay | Admitting: Rheumatology

## 2016-10-06 NOTE — Telephone Encounter (Addendum)
Last Visit: 08/05/16 Next Visit: 10/14/16  Labs 08/05/16 cbc/ cmp WNL PPD: 11/2015 Neg  Okay to refill per Dr Estanislado Pandy

## 2016-10-07 ENCOUNTER — Ambulatory Visit: Payer: BLUE CROSS/BLUE SHIELD | Admitting: Rheumatology

## 2016-10-07 ENCOUNTER — Ambulatory Visit (INDEPENDENT_AMBULATORY_CARE_PROVIDER_SITE_OTHER): Payer: BLUE CROSS/BLUE SHIELD | Admitting: Rheumatology

## 2016-10-07 DIAGNOSIS — M1711 Unilateral primary osteoarthritis, right knee: Secondary | ICD-10-CM | POA: Diagnosis not present

## 2016-10-07 DIAGNOSIS — M170 Bilateral primary osteoarthritis of knee: Secondary | ICD-10-CM

## 2016-10-07 DIAGNOSIS — M1712 Unilateral primary osteoarthritis, left knee: Secondary | ICD-10-CM | POA: Diagnosis not present

## 2016-10-07 MED ORDER — LIDOCAINE HCL 1 % IJ SOLN
1.5000 mL | INTRAMUSCULAR | Status: AC | PRN
  Administered 2016-10-07: 1.5 mL

## 2016-10-07 MED ORDER — SODIUM HYALURONATE (VISCOSUP) 20 MG/2ML IX SOSY
20.0000 mg | PREFILLED_SYRINGE | INTRA_ARTICULAR | Status: AC | PRN
  Administered 2016-10-07: 20 mg via INTRA_ARTICULAR

## 2016-10-07 NOTE — Progress Notes (Signed)
   Procedure Note  Patient: Jade Cardenas             Date of Birth: 11/02/1950           MRN: [id number]             Visit Date: 10/14/2016  Procedures: Visit Diagnoses: Primary osteoarthritis of both knees Euflexxa #3 right knee and #2 left knee Large Joint Inj Date/Time: 10/14/2016 11:28 AM Performed by: Bo Merino Authorized by: Bo Merino   Consent Given by:  Patient Site marked: the procedure site was marked   Timeout: prior to procedure the correct patient, procedure, and site was verified   Indications:  Pain and joint swelling Location:  Knee Site:  R knee Prep: patient was prepped and draped in usual sterile fashion   Needle Size:  27 G Needle Length:  1.5 inches Approach:  Medial Ultrasound Guidance: No   Fluoroscopic Guidance: No   Arthrogram: No   Medications:  20 mg Sodium Hyaluronate 20 MG/2ML; 1.5 mL lidocaine 1 % Aspiration Attempted: Yes   Patient tolerance:  Patient tolerated the procedure well with no immediate complications  Large Joint Inj Date/Time: 10/14/2016 12:53 PM Performed by: Bo Merino Authorized by: Bo Merino   Consent Given by:  Patient Site marked: the procedure site was marked   Timeout: prior to procedure the correct patient, procedure, and site was verified   Indications:  Pain and joint swelling Location:  Knee Site:  L knee Prep: patient was prepped and draped in usual sterile fashion   Needle Size:  27 G Needle Length:  1.5 inches Approach:  Medial Ultrasound Guidance: No   Fluoroscopic Guidance: No   Arthrogram: No   Medications:  1.5 mL lidocaine 1 %; 20 mg Sodium Hyaluronate 20 MG/2ML Aspiration Attempted: Yes   Patient tolerance:  Patient tolerated the procedure well with no immediate complications    Bo Merino, MD

## 2016-10-08 ENCOUNTER — Telehealth: Payer: Self-pay | Admitting: Pharmacist

## 2016-10-08 NOTE — Telephone Encounter (Signed)
Patient was in the office on 10/07/16 for knee joint injections.  This is a late documentation due to EPIC downtime.  Patient had questions regarding switching to [employer] and her Simponi injections.  She is going to be switching to medicare in March 2019.  Discussed that there are two forms of Simponi - subcutaneous monthly injections that would be covered by medicare part D and infusions every 8 weeks that would be covered by medicare part B.    Reviewed that patient would need new prior authorization for Simponi under her medicare part D insurance.  If Simponi subcutnaeous injections are too expensive with her medicare part D insurance, there are patient assistance foundations that help with copays for medicare patients if they are available or there is pharmaceutical assistance program for Simponi through the manufacturer that she may qualify for.  This program is income based.    I reviewed that if infusions are considered, original medicare covers 80% of infusions and most medicare supplement plans cover the remaining 20%.  I advised we can complete a benefits investigation with her new insurance if we decide to switch to infusions.    Patient has an Medical illustrator and she plans to discuss plan options with them soon. Patient denied any other medication related questions at this time.   Elisabeth Most, Pharm.D., BCPS, CPP Clinical Pharmacist Pager: [phone] Phone: [phone] 10/08/2016 8:10 AM

## 2016-10-09 ENCOUNTER — Encounter: Payer: Self-pay | Admitting: Vascular Surgery

## 2016-10-14 ENCOUNTER — Ambulatory Visit: Payer: BLUE CROSS/BLUE SHIELD | Admitting: Rheumatology

## 2016-10-14 ENCOUNTER — Ambulatory Visit (INDEPENDENT_AMBULATORY_CARE_PROVIDER_SITE_OTHER): Payer: BLUE CROSS/BLUE SHIELD | Admitting: Rheumatology

## 2016-10-14 DIAGNOSIS — M1711 Unilateral primary osteoarthritis, right knee: Secondary | ICD-10-CM | POA: Diagnosis not present

## 2016-10-14 DIAGNOSIS — M170 Bilateral primary osteoarthritis of knee: Secondary | ICD-10-CM

## 2016-10-14 DIAGNOSIS — M1712 Unilateral primary osteoarthritis, left knee: Secondary | ICD-10-CM

## 2016-10-14 MED ORDER — LIDOCAINE HCL 1 % IJ SOLN
1.5000 mL | INTRAMUSCULAR | Status: AC | PRN
  Administered 2016-10-14: 1.5 mL

## 2016-10-14 MED ORDER — SODIUM HYALURONATE (VISCOSUP) 20 MG/2ML IX SOSY
20.0000 mg | PREFILLED_SYRINGE | INTRA_ARTICULAR | Status: AC | PRN
  Administered 2016-10-14: 20 mg via INTRA_ARTICULAR

## 2016-10-19 DIAGNOSIS — M5136 Other intervertebral disc degeneration, lumbar region: Secondary | ICD-10-CM | POA: Insufficient documentation

## 2016-10-19 NOTE — Progress Notes (Signed)
Office Visit Note  Patient: Jade Cardenas             Date of Birth: 06/04/1950           MRN: [id number]             PCP: [name], MD Referring: [name], MD Visit Date: 10/21/2016 Occupation: @GUAROCC @    Subjective:  .  left knee pain  History of Present Illness: Jade Cardenas is a 66 y.o. female history of psoriatic arthritis and psoriasis. She states she is doing quite well on the combination of Simponi subcutaneous and methotrexate. She has not had any psoriasis flare or increased joint swelling. She had injury to her left knee joint which is a still painful. She's been getting Euflexxa injections to her bilateral knee joints and has been tolerating them well. She's been having some discomfort in her right fourth toe which is bruised this morning. She does not recall any injury. Neck and lower back pain is tolerable. She continues to have some fatigue.  Activities of Daily Living:  Patient reports morning stiffness for 1-2 hours.   Patient Reports nocturnal pain.  Difficulty dressing/grooming: Denies Difficulty climbing stairs: Reports Difficulty getting out of chair: Reports Difficulty using hands for taps, buttons, cutlery, and/or writing: Denies   Review of Systems  Constitutional: Positive for fatigue. Negative for night sweats, weight gain, weight loss and weakness.  HENT: Positive for mouth dryness. Negative for mouth sores, trouble swallowing, trouble swallowing and nose dryness.   Eyes: Positive for dryness. Negative for pain, redness and visual disturbance.  Respiratory: Negative for cough, shortness of breath and difficulty breathing.   Cardiovascular: Negative for chest pain, palpitations, hypertension, irregular heartbeat and swelling in legs/feet.  Gastrointestinal: Negative for blood in stool, constipation and diarrhea.  Endocrine: Negative for increased urination.  Genitourinary: Negative for vaginal dryness.  Musculoskeletal: Positive  for arthralgias, joint pain and morning stiffness. Negative for joint swelling, myalgias, muscle weakness, muscle tenderness and myalgias.  Skin: Negative for color change, rash, hair loss, skin tightness, ulcers and sensitivity to sunlight.  Allergic/Immunologic: Negative for susceptible to infections.  Neurological: Negative for dizziness, memory loss and night sweats.  Hematological: Negative for swollen glands.  Psychiatric/Behavioral: Positive for sleep disturbance. Negative for depressed mood. The patient is not nervous/anxious.     PMFS History:  Patient Active Problem List   Diagnosis Date Noted  . DDD (degenerative disc disease), lumbar 10/19/2016  . 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 Fibromyalgia 05/17/2016  . Other psoriasis 02/18/2016  . High risk medication use 02/18/2016  . DJD (degenerative joint disease), cervical 02/18/2016  . Heel spur 09/29/2015  . Ingrown toenail 09/29/2015  . Iron deficiency anemia due to chronic blood loss 08/14/2015  . Foraminal stenosis of cervical region 07/30/2015  . Microcytic hypochromic anemia 05/22/2015  . Tailor's bunion, left 01/18/2015  . H/O hiatal hernia   . Irritable bowel syndrome (IBS) 03/01/2014  . Bilateral primary osteoarthritis of knee 12/15/2013  . Iron deficiency 12/15/2013  . Psoriatic arthritis ([place]) 12/15/2013  . History of foot fracture 12/15/2013  . Dermatitis 10/17/2013  . 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 Arthritis 05/31/2013  . 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 Skin lesion 12/09/2012  . Tinnitus 06/23/2012  . Scalp lesion 01/21/2012  . Headache(784.0) 01/21/2012  . Hx of psoriasis 01/21/2012  . Weight gain 01/21/2012  . Ankylosing spondylitis  possible 09/03/2011  . Hx of transfusion 09/03/2011  .  S/P lumbar fusion 6 13 09/03/2011  . Lumbar stenosis with neurogenic claudication 08/05/2011  . LBP (low back pain) 01/17/2011  . ALLERGIC RHINITIS 02/19/2010  . GERD  02/19/2010  . ARTHRITIS 02/19/2010  . SPINAL STENOSIS, CERVICAL 02/19/2010  . OSTEOPENIA 02/19/2010  . WEIGHT GAIN 12/18/2008  . RASH AND OTHER NONSPECIFIC SKIN ERUPTION 12/07/2007  . IBS 04/28/2007  . PALPITATIONS 02/09/2007  . History of gastric ulcer 02/09/2007  . COLONIC POLYPS, HX OF 02/09/2007  . REDUCTION MAMMOPLASTY, HX OF 02/09/2007  . CHOLECYSTECTOMY, HX OF 02/09/2007  . Hyperlipidemia 12/29/2006  . Essential hypertension 12/29/2006    Past Medical History:  Diagnosis Date  . Abnormal Pap smear    H/O  . Anemia    iron def  . Arthritis    rheumatoid and osteo now flt to be psoriateic   . Arthritis    psoreratic, osteoarthritis  . Broken toe   . Bruises easily   . CHOLECYSTECTOMY, HX OF   . Chronic back pain   . COLONIC POLYPS, HX OF 02/09/2007  . Cough, persistent 01/21/2012   poss from acei   . 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 Fibromyalgia    doesn't require meds  . GASTRIC ULCER, ACUTE, HEMORRHAGE, HX OF 02/09/2007  . GERD (gastroesophageal reflux disease)    takes Nexium daily  . Goiter   . H/O hiatal hernia   . Headache(784.0)    occasionally  . History of bronchitis   . Hx of seasonal allergies    takes Claritin daily  . HYPERLIPIDEMIA 12/29/2006   takes Lovastatin nightly  . HYPERTENSION 12/29/2006   takes Enalapril daily  . IBS 04/28/2007  . Insomnia    related to pain;takes Flexeril and Tylenol PM nightly  . Joint pain   . Joint swelling   . MENOPAUSAL DISORDER 12/07/2007  . Palpitations 02/09/2007  . PONV (postoperative nausea and vomiting)    NAUSEA  . REDUCTION MAMMOPLASTY, HX OF 02/09/2007  . Rheumatoid arthritis(714.0) 12/29/2006  . Right bundle branch block 02/09/2007  . S/P lumbar fusion 6 13 09/03/2011   L4 L5  posterior   . SYNCOPE 12/07/2007  . UNS ADVRS EFF OTH RX MEDICINAL\T\BIOLOGICAL SBSTNC 02/09/2007  . UTI (lower urinary tract infection)    hx of;saw urologist last yr and he told her she was over reacting and not a big  deal   . WEIGHT GAIN 12/18/2008    Family History  Problem Relation Age of Onset  . Diabetes Mother   . Colon cancer Mother        x2  . Arthritis Mother   . Hypertension Mother   . Endometrial cancer Mother   . Stroke Father   . Heart disease Father   . Colon cancer Father   . Arthritis Brother   . Hypertension Brother   . Hyperlipidemia Brother   . Other Unknown        DISH brother  . Hypertension Maternal Grandmother   . Stroke Maternal Grandmother   . Infertility Brother   . Anesthesia problems Neg Hx   . Hypotension Neg Hx   . Malignant hyperthermia Neg Hx   . Pseudochol deficiency Neg Hx    Past Surgical History:  Procedure Laterality Date  . ABDOMINAL HYSTERECTOMY  1986   TAH/BSO  . BACK SURGERY  12+yrs ago   Synovial Cyst removal  . BACK SURGERY  6/13   lumbar L4-L5 fusion, lamenectomy  . blood transfused  6/13   transfused 2  units post op from back surgery  . bone spur  6-77yrs ago   right ankle  . BREAST REDUCTION SURGERY     last 80's  . BUNIONECTOMY  10/03/14   right foot  . CERVICAL FUSION     2 12   . CHOLECYSTECTOMY  1996  . COLONOSCOPY    . ESOPHAGOGASTRODUODENOSCOPY ENDOSCOPY    . POSTERIOR CERVICAL LAMINECTOMY Left 07/30/2015   Procedure: Laminectomy and Foraminotomy - left - Cervical two -Cervical three;  Surgeon: Earnie Larsson, MD;  Location: [facility];  Service: Neurosurgery;  Laterality: Left;  . REDUCTION MAMMAPLASTY Bilateral 1989  . 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 She appears well-developed and well-nourished.  HENT:  Head: Normocephalic and atraumatic.    Eyes: Conjunctivae and EOM are normal.  Neck: Normal range of motion.  Cardiovascular: Normal rate, regular rhythm, normal heart sounds and intact distal pulses.   Pulmonary/Chest: Effort normal and breath sounds normal.  Abdominal: Soft. Bowel sounds are normal.  Lymphadenopathy:    She has no cervical adenopathy.  Neurological: She is alert and oriented to person, place, and time.  Skin: Skin is warm and dry. Capillary refill takes less than 2 seconds.  Psychiatric: She has a normal mood and affect. Her behavior is normal.  Nursing note and vitals reviewed.    Musculoskeletal Exam: C-spine good range of motion. She has thoracic kyphosis. Lumbar spine limited range of motion with some discomfort. Shoulder joints elbow joints wrist joint MCPs PIPs DIPs with good range of motion with no synovitis. Hip joints knee joints ankles MTPs PIPs with good range of motion. She has some limited extension in her left knee joint. She has bruising and tenderness on her right fourth toe.  CDAI Exam: CDAI Homunculus Exam:   Tenderness:  LLE: tibiofemoral Right foot: 4th MTP  Joint Counts:  CDAI Tender Joint count: 1 CDAI Swollen Joint count: 0  Global Assessments:  Patient Global Assessment: 4 Provider Global Assessment: 2  CDAI Calculated Score: 7    Investigation:          11/2015 PPD negative No additional findings. CBC Latest Ref Rng & Units 08/05/2016 05/07/2016 03/05/2016  WBC 3.8 - 10.8 K/uL 5.4 5.9 6.7  Hemoglobin 11.7 - 15.5 g/dL 14.5 13.7 14.0  Hematocrit 35.0 - 45.0 % 41.7 39.3 41.5  Platelets 140 - 400 K/uL 308 291 359   CMP     Component Value Date/Time   NA 142 08/05/2016 0918   NA 141 05/07/2016 1326   K 4.3 08/05/2016 0918   K 4.1 05/07/2016 1326   CL 104 08/05/2016 0918   CO2 26 08/05/2016 0918   CO2 26 05/07/2016 1326   GLUCOSE 90 08/05/2016 0918   GLUCOSE 135 05/07/2016 1326   BUN 16 08/05/2016 0918   BUN 20.4 05/07/2016 1326   CREATININE 0.72 08/05/2016 0918    CREATININE 0.8 05/07/2016 1326   CALCIUM 9.8 08/05/2016 0918   CALCIUM 9.8 05/07/2016 1326   PROT 7.7  08/05/2016 0918   PROT 7.9 05/07/2016 1326   ALBUMIN 4.7 08/05/2016 0918   ALBUMIN 4.2 05/07/2016 1326   AST 20 08/05/2016 0918   AST 18 05/07/2016 1326   ALT 26 08/05/2016 0918   ALT 21 05/07/2016 1326   ALKPHOS 87 08/05/2016 0918   ALKPHOS 107 05/07/2016 1326   BILITOT 0.8 08/05/2016 0918   BILITOT 0.63 05/07/2016 1326   GFRNONAA 89 08/05/2016 0918   GFRAA >89 08/05/2016 0918    Imaging: Mr Knee Left Wo Contrast  Result Date: 10/02/2016 CLINICAL DATA:  Severe left knee pain secondary to a fall 3 weeks ago. EXAM: MRI OF THE LEFT KNEE WITHOUT CONTRAST TECHNIQUE: Multiplanar, multisequence MR imaging of the knee was performed. No intravenous contrast was administered. COMPARISON:  None. FINDINGS: MENISCI Medial meniscus:  Normal. Lateral meniscus:  Normal. LIGAMENTS Cruciates:  Intact ACL and PCL. Collaterals: Medial collateral ligament is intact. Lateral collateral ligament complex is intact. CARTILAGE Patellofemoral: Diffuse thinning of the cartilage of the apex and medial facet of the patella. Medial:  Normal. Lateral:  Normal. Joint:  Small joint effusion. Popliteal Fossa:  Normal. Extensor Mechanism: Slight edema in the distal vastus medialis muscle consistent with a strain. Otherwise normal. Bones:  Tiny marginal osteophytes in the medial compartment. Other: None IMPRESSION: 1. Subtle strain of the distal vastus medialis muscle. 2. Chondromalacia of the patella. 3. Otherwise, normal exam. Electronically Signed   By: Lorriane Shire M.D.   On: 10/02/2016 10:06   Xr Foot Complete Right  Result Date: 10/21/2016 Posterior inferior calcaneal spur was noted. Right first MTP all PIP/DIP joint space narrowing was noted. A screw was noted in the fifth metatarsal. No erosive changes were noted. No fracture was noted.  Xr Knee 3 View Left  Result Date: 09/25/2016 Left knee x-ray 3 views #1:  Moderate to severe medial compartment narrowing #2: Lateral patellar tracking #3: No CPPD #4: Moderate patellofemoral joint space narrowing Impression: #1 moderate to severe osteoarthritis of the left knee with no obvious fracture or other abnormality. #2: Status post 14 days from injuring left knee, patient's pain can go as high as 10 on a scale of 0-10 and may benefit from an MRI if no improvement in the next one week after getting a cortisone injection today   Speciality Comments: No specialty comments available.    Procedures:  Large Joint Inj Date/Time: 10/21/2016 9:13 AM Performed by: Bo Merino Authorized by: Bo Merino   Consent Given by:  Patient Site marked: the procedure site was marked   Timeout: prior to procedure the correct patient, procedure, and site was verified   Indications:  Pain and joint swelling Location:  Knee Site:  L knee Prep: patient was prepped and draped in usual sterile fashion   Needle Size:  27 G Needle Length:  1.5 inches Approach:  Medial Ultrasound Guidance: No   Fluoroscopic Guidance: No   Arthrogram: No   Medications:  1.5 mL lidocaine 1 %; 20 mg Sodium Hyaluronate 20 MG/2ML Aspiration Attempted: Yes   Aspirate amount (mL):  0 Patient tolerance:  Patient tolerated the procedure well with no immediate complications   Allergies: Erythromycin; Iodinated diagnostic agents; Ace inhibitors; Dilaudid [hydromorphone hcl]; Morphine and related; Keflex [cephalexin]; and Macrobid [nitrofurantoin]   Assessment / Plan:     Visit Diagnoses: Psoriatic arthritis ([place]): She has no active synovitis on examination today.  High risk medication use - Simponi SubQ monthly;  MTX 0.5 mL subcutaneous every week, Folic acid 1 mg by mouth  daily// Jolee Ewing and [place] caused diarrhea). She is tolerating medications well.  Hx of psoriasis: She has no active psoriasis lesions.  Primary osteoarthritis of both knees she just finished a series of Euflexxa  injection to bilateral knee joints. Left knee third injection was given today in the office and the procedures described above.  Pain in right foot she has bruising and pain in her right fourth toe. She does not recall any injury.- Plan: XR Foot Complete Right. The x-ray did not reveal any fracture.  DJD (degenerative joint disease), cervical - S/P fusion : Chronic pain  DDD (degenerative disc disease), lumbar - S/P fusion .  Iron deficiency - 05/20/2016: Patient is getting anemia/iron deficiency addressed by Dr. Irene Limbo, hematologist  Fibromyalgia: Patient states that her symptoms are not very active currently.  Chronic fatigue - Plan: VITAMIN D 25 Hydroxy (Vit-D Deficiency, Fractures)  Irritable bowel syndrome, unspecified type  Hyperlipidemia, unspecified hyperlipidemia type  History of gastric ulcer - f/u by Dr. [name]   Patient reports that her bone densities done by her GYN.  Orders: Orders Placed This Encounter  Procedures  . Large Joint Injection/Arthrocentesis  . XR Foot Complete Right   Meds ordered this encounter  Medications  . methotrexate 50 MG/2ML injection    Sig: Inject 0.5 mLs (12.5 mg total) into the skin once a week. INJECT 0.5 MLS (12.5 MG TOTAL) INTO THE SKIN ONCE WEEKLY    Dispense:  8 mL    Refill:  0    Please dispense 90 day supply with preservatives only;    Face-to-face time spent with patient was 30 minutes.  Greater than50% of time was spent in counseling and coordination of care.  Follow-Up Instructions: Return in about 4 months (around 02/20/2017) for Psoriatic arthritis.   Bo Merino, MD  Note - This record has been created using Editor, commissioning.  Chart creation errors have been sought, but may not always  have been located. Such creation errors do not reflect on  the standard of medical care.

## 2016-10-21 ENCOUNTER — Ambulatory Visit (INDEPENDENT_AMBULATORY_CARE_PROVIDER_SITE_OTHER): Payer: BLUE CROSS/BLUE SHIELD | Admitting: Rheumatology

## 2016-10-21 ENCOUNTER — Ambulatory Visit: Payer: BLUE CROSS/BLUE SHIELD | Admitting: Rheumatology

## 2016-10-21 ENCOUNTER — Ambulatory Visit (INDEPENDENT_AMBULATORY_CARE_PROVIDER_SITE_OTHER): Payer: Self-pay

## 2016-10-21 ENCOUNTER — Encounter: Payer: Self-pay | Admitting: Rheumatology

## 2016-10-21 VITALS — BP 128/74 | HR 74 | Resp 14 | Wt 222.0 lb

## 2016-10-21 DIAGNOSIS — M5030 Other cervical disc degeneration, unspecified cervical region: Secondary | ICD-10-CM | POA: Diagnosis not present

## 2016-10-21 DIAGNOSIS — M797 Fibromyalgia: Secondary | ICD-10-CM

## 2016-10-21 DIAGNOSIS — Z8719 Personal history of other diseases of the digestive system: Secondary | ICD-10-CM

## 2016-10-21 DIAGNOSIS — M47812 Spondylosis without myelopathy or radiculopathy, cervical region: Secondary | ICD-10-CM

## 2016-10-21 DIAGNOSIS — M170 Bilateral primary osteoarthritis of knee: Secondary | ICD-10-CM

## 2016-10-21 DIAGNOSIS — E611 Iron deficiency: Secondary | ICD-10-CM

## 2016-10-21 DIAGNOSIS — M1712 Unilateral primary osteoarthritis, left knee: Secondary | ICD-10-CM

## 2016-10-21 DIAGNOSIS — M5136 Other intervertebral disc degeneration, lumbar region: Secondary | ICD-10-CM

## 2016-10-21 DIAGNOSIS — M79671 Pain in right foot: Secondary | ICD-10-CM

## 2016-10-21 DIAGNOSIS — L4050 Arthropathic psoriasis, unspecified: Secondary | ICD-10-CM | POA: Diagnosis not present

## 2016-10-21 DIAGNOSIS — K589 Irritable bowel syndrome without diarrhea: Secondary | ICD-10-CM | POA: Diagnosis not present

## 2016-10-21 DIAGNOSIS — M51369 Other intervertebral disc degeneration, lumbar region without mention of lumbar back pain or lower extremity pain: Secondary | ICD-10-CM

## 2016-10-21 DIAGNOSIS — Z872 Personal history of diseases of the skin and subcutaneous tissue: Secondary | ICD-10-CM | POA: Diagnosis not present

## 2016-10-21 DIAGNOSIS — Z8711 Personal history of peptic ulcer disease: Secondary | ICD-10-CM

## 2016-10-21 DIAGNOSIS — E785 Hyperlipidemia, unspecified: Secondary | ICD-10-CM | POA: Diagnosis not present

## 2016-10-21 DIAGNOSIS — M1711 Unilateral primary osteoarthritis, right knee: Secondary | ICD-10-CM

## 2016-10-21 DIAGNOSIS — Z79899 Other long term (current) drug therapy: Secondary | ICD-10-CM | POA: Diagnosis not present

## 2016-10-21 DIAGNOSIS — R5382 Chronic fatigue, unspecified: Secondary | ICD-10-CM | POA: Diagnosis not present

## 2016-10-21 MED ORDER — METHOTREXATE SODIUM CHEMO INJECTION 50 MG/2ML: 12.5000 mg | INTRAMUSCULAR | 0 refills | Status: DC

## 2016-10-21 MED ORDER — SODIUM HYALURONATE (VISCOSUP) 20 MG/2ML IX SOSY
20.0000 mg | PREFILLED_SYRINGE | INTRA_ARTICULAR | Status: AC | PRN
  Administered 2016-10-21: 20 mg via INTRA_ARTICULAR

## 2016-10-21 MED ORDER — LIDOCAINE HCL 1 % IJ SOLN
1.5000 mL | INTRAMUSCULAR | Status: AC | PRN
  Administered 2016-10-21: 1.5 mL

## 2016-10-21 NOTE — Patient Instructions (Addendum)
Standing Labs We placed an order today for your standing lab work.    Please come back and get your standing labs in September and every 3 months(CBC,CMP with GFR) please check vitamin D in September. PPD is due September 2018  We have open lab Monday through Friday from 8:30-11:30 AM and 1:30-4 PM at the office of Dr. Bo Merino.   The office is located at [address], [place], [place], [place] [address] No appointment is necessary.   Labs are drawn by [facility].  You may receive a bill from [facility] for your lab work. If you have any questions regarding directions or hours of operation,  please call [phone].

## 2016-10-22 ENCOUNTER — Other Ambulatory Visit: Payer: Self-pay | Admitting: Rheumatology

## 2016-10-23 NOTE — Telephone Encounter (Signed)
Last Visit: 10/21/16 Next Visit: 02/16/17 Labs: 08/05/16 CBC/ CMP WNL  Okay to refill per Dr. Estanislado Pandy

## 2016-10-28 ENCOUNTER — Other Ambulatory Visit (HOSPITAL_BASED_OUTPATIENT_CLINIC_OR_DEPARTMENT_OTHER): Payer: BLUE CROSS/BLUE SHIELD

## 2016-10-28 DIAGNOSIS — D509 Iron deficiency anemia, unspecified: Secondary | ICD-10-CM | POA: Diagnosis not present

## 2016-10-28 DIAGNOSIS — E538 Deficiency of other specified B group vitamins: Secondary | ICD-10-CM

## 2016-10-28 DIAGNOSIS — D500 Iron deficiency anemia secondary to blood loss (chronic): Secondary | ICD-10-CM

## 2016-10-28 LAB — CBC & DIFF AND RETIC
BASO%: 0.5 % (ref 0.0–2.0)
Basophils Absolute: 0 10*3/uL (ref 0.0–0.1)
EOS%: 1 % (ref 0.0–7.0)
Eosinophils Absolute: 0.1 10*3/uL (ref 0.0–0.5)
HCT: 37.5 % (ref 34.8–46.6)
HGB: 13.2 g/dL (ref 11.6–15.9)
Immature Retic Fract: 16.2 % — ABNORMAL HIGH (ref 1.60–10.00)
LYMPH%: 32.5 % (ref 14.0–49.7)
MCH: 35 pg — ABNORMAL HIGH (ref 25.1–34.0)
MCHC: 35.2 g/dL (ref 31.5–36.0)
MCV: 99.5 fL (ref 79.5–101.0)
MONO#: 0.3 10*3/uL (ref 0.1–0.9)
MONO%: 5.9 % (ref 0.0–14.0)
NEUT#: 3.5 10*3/uL (ref 1.5–6.5)
NEUT%: 60.1 % (ref 38.4–76.8)
Platelets: 276 10*3/uL (ref 145–400)
RBC: 3.77 10*6/uL (ref 3.70–5.45)
RDW: 12.7 % (ref 11.2–14.5)
Retic %: 2.27 % — ABNORMAL HIGH (ref 0.70–2.10)
Retic Ct Abs: 85.58 10*3/uL (ref 33.70–90.70)
WBC: 5.8 10*3/uL (ref 3.9–10.3)
lymph#: 1.9 10*3/uL (ref 0.9–3.3)

## 2016-10-28 LAB — COMPREHENSIVE METABOLIC PANEL
ALT: 23 U/L (ref 0–55)
AST: 19 U/L (ref 5–34)
Albumin: 3.8 g/dL (ref 3.5–5.0)
Alkaline Phosphatase: 78 U/L (ref 40–150)
Anion Gap: 6 mEq/L (ref 3–11)
BUN: 17.9 mg/dL (ref 7.0–26.0)
CO2: 29 mEq/L (ref 22–29)
Calcium: 9.9 mg/dL (ref 8.4–10.4)
Chloride: 105 mEq/L (ref 98–109)
Creatinine: 0.8 mg/dL (ref 0.6–1.1)
EGFR: 79 mL/min/{1.73_m2} — ABNORMAL LOW (ref >90)
Glucose: 95 mg/dl (ref 70–140)
Potassium: 4.3 mEq/L (ref 3.5–5.1)
Sodium: 140 mEq/L (ref 136–145)
Total Bilirubin: 0.69 mg/dL (ref 0.20–1.20)
Total Protein: 7.2 g/dL (ref 6.4–8.3)

## 2016-11-03 ENCOUNTER — Other Ambulatory Visit: Payer: Self-pay | Admitting: Rheumatology

## 2016-11-04 ENCOUNTER — Encounter: Payer: Self-pay | Admitting: Hematology

## 2016-11-04 ENCOUNTER — Ambulatory Visit (HOSPITAL_BASED_OUTPATIENT_CLINIC_OR_DEPARTMENT_OTHER): Payer: BLUE CROSS/BLUE SHIELD | Admitting: Hematology

## 2016-11-04 VITALS — BP 149/52 | HR 85 | Temp 98.9°F | Resp 18 | Ht 64.0 in | Wt 222.4 lb

## 2016-11-04 DIAGNOSIS — D509 Iron deficiency anemia, unspecified: Secondary | ICD-10-CM

## 2016-11-04 DIAGNOSIS — D500 Iron deficiency anemia secondary to blood loss (chronic): Secondary | ICD-10-CM

## 2016-11-09 NOTE — Progress Notes (Signed)
Marland Kitchen    HEMATOLOGY/ONCOLOGY CLINIC NOTE  Date of Service: .11/04/2016  Patient Care Team: [name], MD as PCP - General Richmond Campbell, MD (Gastroenterology) Megan Salon, MD (Gynecology) Bo Merino, MD as Consulting Physician (Rheumatology) Brunetta Genera, MD as Consulting Physician (Hematology)  CHIEF COMPLAINTS/PURPOSE OF CONSULTATION: f/u for Iron deficiency anemia  Diagnosis severe iron deficiency anemia + ACD (due to psoariasis) Oral iron intolerance  Treatment IV Iron as needed (received IV feraheme 08/29/15, 08/22/2015, 06/04/2015, 05/28/2015)   INTERVAL HISTORY  Jade Cardenas is here for her scheduled follow-up for iron deficiency anemia. She has had issues with knee pain related to a recent fall with muscle strain. She continues to be on Symponi for her psoariatic arthritis as per her rheumatologist.   Reports no overt GI bleeding or melena. No abdominal pain.  Notes that her last EGD with [name] showed that her GU had healed. Labs on 8/29 show stable hgb of 13.2. Ferritin was not done with those labs and patient did not want to get additional labs today for this. She notes she will have this repeated with her PCP. Ferritin levels on 08/05/2016 were up to 837.   MEDICAL HISTORY:  Past Medical History:  Diagnosis Date  . Abnormal Pap smear    H/O  . Anemia    iron def  . Arthritis    rheumatoid and osteo now flt to be psoriateic   . Arthritis    psoreratic, osteoarthritis  . Broken toe   . Bruises easily   . CHOLECYSTECTOMY, HX OF   . Chronic back pain   . COLONIC POLYPS, HX OF 02/09/2007  . Cough, persistent 01/21/2012   poss from acei   . 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 Fibromyalgia    doesn't require meds  . GASTRIC ULCER, ACUTE, HEMORRHAGE, HX OF 02/09/2007  . GERD (gastroesophageal reflux disease)    takes Nexium daily  . Goiter   . H/O hiatal hernia   . Headache(784.0)    occasionally  . History of bronchitis   . Hx of  seasonal allergies    takes Claritin daily  . HYPERLIPIDEMIA 12/29/2006   takes Lovastatin nightly  . HYPERTENSION 12/29/2006   takes Enalapril daily  . IBS 04/28/2007  . Insomnia    related to pain;takes Flexeril and Tylenol PM nightly  . Joint pain   . Joint swelling   . MENOPAUSAL DISORDER 12/07/2007  . Palpitations 02/09/2007  . PONV (postoperative nausea and vomiting)    NAUSEA  . REDUCTION MAMMOPLASTY, HX OF 02/09/2007  . Rheumatoid arthritis(714.0) 12/29/2006  . Right bundle branch block 02/09/2007  . S/P lumbar fusion 6 13 09/03/2011   L4 L5  posterior   . SYNCOPE 12/07/2007  . UNS ADVRS EFF OTH RX MEDICINAL\T\BIOLOGICAL SBSTNC 02/09/2007  . UTI (lower urinary tract infection)    hx of;saw urologist last yr and he told her she was over reacting and not a big deal   . WEIGHT GAIN 12/18/2008    SURGICAL HISTORY: Past Surgical History:  Procedure Laterality Date  . ABDOMINAL HYSTERECTOMY  1986   TAH/BSO  . BACK SURGERY  12+yrs ago   Synovial Cyst removal  . BACK SURGERY  6/13   lumbar L4-L5 fusion, lamenectomy  . blood transfused  6/13   transfused 2 units post op from back surgery  . bone spur  6-67yrs ago   right ankle  . BREAST REDUCTION SURGERY     last 80's  .  BUNIONECTOMY  10/03/14   right foot  . CERVICAL FUSION     2 12   . CHOLECYSTECTOMY  1996  . COLONOSCOPY    . ESOPHAGOGASTRODUODENOSCOPY ENDOSCOPY    . POSTERIOR CERVICAL LAMINECTOMY Left 07/30/2015   Procedure: Laminectomy and Foraminotomy - left - Cervical two -Cervical three;  Surgeon: Earnie Larsson, MD;  Location: [place] [facility];  Service: Neurosurgery;  Laterality: Left;  . REDUCTION MAMMAPLASTY Bilateral 1989  . TONSILLECTOMY     as a child    SOCIAL HISTORY: Social History   Social History  . Marital status: Widowed    Spouse name: N/A  . Number of children: N/A  . Years of education: N/A   Occupational History  . Not on file.   Social History Main Topics  . Smoking status: Never Smoker    . Smokeless tobacco: Never Used  . Alcohol use No  . Drug use: No  . Sexual activity: No     Comment: TAH/BSO   Other Topics Concern  . 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 Diabetes Mother   . Colon cancer Mother        x2  . Arthritis Mother   . Hypertension Mother   . Endometrial cancer Mother   . Stroke Father   . Heart disease Father   . Colon cancer Father   . Arthritis Brother   . Hypertension Brother   . Hyperlipidemia Brother   . Other Unknown        DISH brother  . Hypertension Maternal Grandmother   . Stroke Maternal Grandmother   . Infertility Brother   . Anesthesia problems Neg Hx   . Hypotension Neg Hx   . Malignant hyperthermia Neg Hx   . Pseudochol deficiency Neg Hx     ALLERGIES:  is allergic to erythromycin; iodinated diagnostic agents; ace inhibitors; dilaudid [hydromorphone hcl]; morphine and related; keflex [cephalexin]; and macrobid [nitrofurantoin].  MEDICATIONS:  Current Outpatient Prescriptions  Medication Sig Dispense Refill  . Probiotic Product (VSL#3 PO) Take 1 Units by mouth 2 (two) times daily.    Marland Kitchen acetaminophen (TYLENOL) 650 MG CR tablet Take 650 mg by mouth as needed for pain.    Marland Kitchen alosetron (LOTRONEX) 0.5 MG tablet Take 0.5 mg by mouth 2 (two) times daily.  0  . amLODipine (NORVASC) 5 MG tablet TAKE 1 TABLET (5 MG TOTAL) BY MOUTH DAILY. 90 tablet 3  . B-D ALLERGY SYRINGE 1CC/28G 28G X 1/2" 1 ML MISC USE AS DIRECTED ONCE WEEKLY  4  . Cholecalciferol (VITAMIN D) 2000 units tablet Take 2,000 Units by mouth daily.    . clobetasol ointment (TEMOVATE) 0.05 % APPLY TWICE A DAY 45 g 1  . Diphenhydramine-APAP, sleep, (TYLENOL PM EXTRA STRENGTH PO) Take 2 tablets by mouth at bedtime as needed. For sleep    . esomeprazole (NEXIUM) 40  MG capsule Take 1 capsule (40 mg total) by mouth daily before breakfast. (Patient taking differently: Take 80 mg by mouth daily before breakfast. ) 30 capsule 11  . folic acid (FOLVITE) 1 MG tablet Take 1 tablet (1 mg total) by mouth daily. 90 tablet 4  . losartan (COZAAR) 100 MG tablet TAKE 1 TABLET (100 MG TOTAL) BY MOUTH DAILY. [place]  tablet 3  . lovastatin (MEVACOR) 20 MG tablet TAKE 1 TABLET (20 MG TOTAL) BY MOUTH AT BEDTIME. 90 tablet 1  . methotrexate 50 MG/2ML injection Inject 0.5 mLs (12.5 mg total) into the skin once a week. INJECT 0.5 MLS (12.5 MG TOTAL) INTO THE SKIN ONCE WEEKLY 8 mL 0  . Multiple Vitamin (MULTIVITAMIN) tablet Take 1 tablet by mouth daily.      Marland Kitchen SIMPONI 50 MG/0.5ML SOAJ INJECT 50MG  (1 PEN) UNDER THE SKIN ONCE MONTHLY 3 Syringe 0   No current facility-administered medications for this visit.     REVIEW OF SYSTEMS:    10 Point review of Systems was done is negative except as noted above.  PHYSICAL EXAMINATION: ECOG PERFORMANCE STATUS: 2 - Symptomatic, <50% confined to bed  . Vitals:   11/04/16 1035  BP: (!) 149/52  Pulse: 85  Resp: 18  Temp: 98.9 F (37.2 C)  SpO2: 99%   Filed Weights   11/04/16 1035  Weight: 222 lb 6.4 oz (100.9 kg)   .Body mass index is 38.17 kg/m.  GENERAL:alert, in no acute distress and comfortabl SKIN: skin color, texture, turgor are normal, no rashes or significant lesions EYES: mild conjunctival pallor no scleral icterus OROPHARYNX:no exudate, no erythema and lips, buccal mucosa, and tongue normal  NECK: supple, no JVD, thyroid normal size, non-tender, without nodularity LYMPH:  no palpable lymphadenopathy in the cervical, axillary or inguinal LUNGS: clear to auscultation with normal respiratory effort HEART: regular rate & rhythm,  no murmurs and no lower extremity edema ABDOMEN: abdomen soft, non-tender, normoactive bowel sounds  Musculoskeletal: no cyanosis of digits and no clubbing  PSYCH: alert & oriented x 3 with  fluent speech NEURO: no focal motor/sensory deficits  LABORATORY DATA:  I have reviewed the data as listed  . CBC Latest Ref Rng & Units 10/28/2016 08/05/2016 05/07/2016  WBC 3.9 - 10.3 10e3/uL 5.8 5.4 5.9  Hemoglobin 11.6 - 15.9 g/dL 13.2 14.5 13.7  Hematocrit 34.8 - 46.6 % 37.5 41.7 39.3  Platelets 145 - 400 10e3/uL 276 308 291   . CBC    Component Value Date/Time   WBC 5.8 10/28/2016 1029   WBC 5.4 08/05/2016 0918   RBC 3.77 10/28/2016 1029   RBC 4.26 08/05/2016 0918   HGB 13.2 10/28/2016 1029   HCT 37.5 10/28/2016 1029   PLT 276 10/28/2016 1029   MCV 99.5 10/28/2016 1029   MCH 35.0 (H) 10/28/2016 1029   MCH 34.0 (H) 08/05/2016 0918   MCHC 35.2 10/28/2016 1029   MCHC 34.8 08/05/2016 0918   RDW 12.7 10/28/2016 1029   LYMPHSABS 1.9 10/28/2016 1029   MONOABS 0.3 10/28/2016 1029   EOSABS 0.1 10/28/2016 1029   BASOSABS 0.0 10/28/2016 1029    . Lab Results  Component Value Date   RETICCTPCT 2.27 (H) 10/28/2016   RBC 3.77 10/28/2016   RETICCTABS 85.58 10/28/2016    . CMP Latest Ref Rng & Units 10/28/2016 08/05/2016 05/07/2016  Glucose 70 - 140 mg/dl 95 90 135  BUN 7.0 - 26.0 mg/dL 17.9 16 20.4  Creatinine 0.6 - 1.1 mg/dL 0.8 0.72 0.8  Sodium 136 - 145 mEq/L 140 142 141  Potassium 3.5 - 5.1 mEq/L 4.3 4.3 4.1  Chloride 98 - 110 mmol/L - 104 -  CO2 22 - 29 mEq/L 29 26 26   Calcium 8.4 - 10.4 mg/dL 9.9 9.8 9.8  Total Protein 6.4 - 8.3 g/dL 7.2 7.7 7.9  Total Bilirubin 0.20 - 1.20 mg/dL 0.69 0.8 0.63  Alkaline Phos 40 - 150 U/L 78 87 107  AST 5 - 34 U/L 19 20 18   ALT 0 - 55 U/L 23 26 21     Lab Results  Component Value Date   FERRITIN 837 (H) 08/05/2016    RADIOGRAPHIC STUDIES: I have personally reviewed the radiological images as listed and agreed with the findings in the report. Xr Foot Complete Right  Result Date: 10/21/2016 Posterior inferior calcaneal spur was noted. Right first MTP all PIP/DIP joint space narrowing was noted. A screw was noted in the fifth  metatarsal. No erosive changes were noted. No fracture was noted.   ASSESSMENT & PLAN:   Jade. Buechele is a pleasant 66 year old female with  #1 Severe microcytic hypochromic anemia  Due to iron deficiency. Patient's hemoglobin has responded very well to IV iron with improvement in her hemoglobin from 7.2 to 12.4 and now is stable at 13.2 with nl MCV . Ferritin levels were elevated to 800's post IV Iron and likely as an acute phase reactant.  #2 severe iron deficiency likely due to chronic GI bleeding + poor oral iron absorption. She has a EGD and colonoscopy in 2013.  Previous history of gastric ulcer requiring blood transfusions.  Also send gastric polyps and a large hernia and prone to developing Cameron ulcers, previous colonic polyps and previous bleeding hemorrhoids.  Patient reports having recent EGD and colonoscopy with [name] in April 2017 which showed a large gastric ulcer. She reports that it was noted to be benign and Helicobacter pylori negative. The reports of the studies and the pathology are not available to [place] at this time. Patient notes that she has had f/u EGD which showed that the GU has healed - EGD read not available to [place] currently.  #3 history of poor tolerance to oral iron with significant GI distress.  She is also on chronic PPI therapy that puts her at higher risk of developing iron deficiency due to poor absorption.  And also B12 deficiency and possible vitamin D deficiency. Plan --Reasonable to continue taking her multivitamin tablet. -recent ferritin level on 08/05/2016 were elevated from IV iron and as an acute phase reactant frominflammation. -she did not want her ferritin levels to be rechecked today -recheck labs including CBC and ferritin levels and Iron profile with PCP in 1-2 months. -she will continue f/u for IDA with her PCP as per her request and will se eus back on an as needed basis.   Return to care with Dr.Daira Hine on an as needed basis.  All of the  patients questions were answered with apparent satisfaction. The patient knows to call the clinic with any problems, questions or concerns.  I spent 20 minutes counseling the patient face to face. The total time spent in the appointment was 20 minutes and more than 50% was on counseling and direct patient cares.    Sullivan Lone MD [facility] AAHIVMS [facility] Hematology/Oncology Physician [facility], [place]  (Office):       [phone] (Work cell):  [phone] (Fax):           [phone]  11/09/2016 2:53 AM

## 2016-11-16 ENCOUNTER — Telehealth: Payer: Self-pay | Admitting: Rheumatology

## 2016-11-16 NOTE — Telephone Encounter (Signed)
Last Visit: 10/21/16 Next Visit: 02/16/17 Labs: 8/29/18CBC/ CMP WNL PPD" 11/29/15 Neg  Okay to  Give sample of sample of Simponi?

## 2016-11-16 NOTE — Telephone Encounter (Signed)
ok 
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
## 2016-11-17 NOTE — Telephone Encounter (Signed)
Patient advised okay to come and get a sample. Patient also advised Dr. Estanislado Pandy does recommend that she see an orthopedist. Patient states she will call and make an appointment.

## 2016-11-20 ENCOUNTER — Encounter: Payer: Self-pay | Admitting: Internal Medicine

## 2016-11-23 ENCOUNTER — Other Ambulatory Visit: Payer: BLUE CROSS/BLUE SHIELD

## 2016-11-24 ENCOUNTER — Encounter (INDEPENDENT_AMBULATORY_CARE_PROVIDER_SITE_OTHER): Payer: Self-pay | Admitting: Orthopaedic Surgery

## 2016-11-24 ENCOUNTER — Ambulatory Visit (INDEPENDENT_AMBULATORY_CARE_PROVIDER_SITE_OTHER): Payer: BLUE CROSS/BLUE SHIELD | Admitting: Orthopaedic Surgery

## 2016-11-24 VITALS — BP 141/71 | HR 86 | Resp 14 | Ht 64.0 in | Wt 222.0 lb

## 2016-11-24 DIAGNOSIS — G8929 Other chronic pain: Secondary | ICD-10-CM

## 2016-11-24 DIAGNOSIS — M25562 Pain in left knee: Secondary | ICD-10-CM

## 2016-11-24 MED ORDER — LIDOCAINE HCL 1 % IJ SOLN
5.0000 mL | INTRAMUSCULAR | Status: AC | PRN
  Administered 2016-11-24: 5 mL

## 2016-11-24 MED ORDER — METHYLPREDNISOLONE ACETATE 40 MG/ML IJ SUSP
80.0000 mg | INTRAMUSCULAR | Status: AC | PRN
  Administered 2016-11-24: 80 mg

## 2016-11-24 MED ORDER — BUPIVACAINE HCL 0.5 % IJ SOLN
3.0000 mL | INTRAMUSCULAR | Status: AC | PRN
  Administered 2016-11-24: 3 mL via INTRA_ARTICULAR

## 2016-11-24 NOTE — Progress Notes (Signed)
Office Visit Note   Patient: Jade Cardenas           Date of Birth: 1951-02-07           MRN: [id number] Visit Date: 11/24/2016              Requested by: [name], MD [place], [place] [id number] PCP: [name], MD   Assessment & Plan: Visit Diagnoses:  1. Chronic pain of left knee    Knee pain a combination of multiple factors including psoriatic arthritis and traumatic strain cortisone injection left knee, monitor her response over the next 6 weeks.  Follow-Up Instructions: Return if symptoms worsen or fail to improve.   Orders:  No orders of the defined types were placed in this encounter.  No orders of the defined types were placed in this encounter.     Procedures: Large Joint Inj Date/Time: 11/24/2016 4:15 PM Performed by: Garald Balding Authorized by: Garald Balding   Consent Given by:  Patient Timeout: prior to procedure the correct patient, procedure, and site was verified   Indications:  Pain and joint swelling Location:  Knee Site:  L knee Prep: patient was prepped and draped in usual sterile fashion   Needle Size:  25 G Needle Length:  1.5 inches Approach:  Anteromedial Ultrasound Guidance: No   Fluoroscopic Guidance: No   Arthrogram: No   Medications:  5 mL lidocaine 1 %; 80 mg methylPREDNISolone acetate 40 MG/ML; 3 mL bupivacaine 0.5 % Aspiration Attempted: No   Patient tolerance:  Patient tolerated the procedure well with no immediate complications     Clinical Data: No additional findings.   Subjective: Chief Complaint  Patient presents with  . Left Knee - Pain, Injury, Weakness    Mrs. Jade Cardenas is a 66 y o that presents with L knee pain, pt fell on 09/11/16. Pt of Dr. Keturah Barre and she has seen pt for this injury, XR and MRI completed. Pt has tried ice, heat, elevation, ACE wrap and ambulates with a cane.  history of psoriatic arthritis treated by Dr. Estanislado Pandy with Symponi and methotrexate.fell over 2  months ago with increasing left knee pain. Dr. Estanislado Pandy performed course of Visco supplementation which did not resolve knee pain. MRI scan was performed on 10/02/2016. There was a subtle strain of the distal vastus medialis muscle, chondromalacia of the patella and tiny marginal osteophytes in the medial compartment without other abnormalities. Still complaining of left knee pain along the medial compartment  HPI  Review of Systems  Constitutional: Negative for chills, fatigue and fever.  Eyes: Negative for itching.  Respiratory: Negative for chest tightness and shortness of breath.   Cardiovascular: Negative for chest pain, palpitations and leg swelling.  Gastrointestinal: Negative for blood in stool, constipation and diarrhea.  Endocrine: Negative for polyuria.  Genitourinary: Negative for dysuria.  Musculoskeletal: Positive for back pain, joint swelling and neck pain. Negative for neck stiffness.  Allergic/Immunologic: Negative for immunocompromised state.  Neurological: Negative for dizziness, weakness, numbness and headaches.  Hematological: Does not bruise/bleed easily.  Psychiatric/Behavioral: Positive for sleep disturbance. The patient is not nervous/anxious.      Objective: Vital Signs: BP (!) 141/71   Pulse 86   Resp 14   Ht 5\' 4"  (1.626 m)   Wt 222 lb (100.7 kg)   LMP 03/02/1984   BMI 38.11 kg/m   Physical Exam  Ortho Examlarge knees. Mild diffuse medial joint tenderness. No popping or clicking. No  obvious effusion. No bruising.Some pain along the pes anserine bursa. No calf pain. No popliteal fullness. Neurovascu lar exam intact.skin intact.Straight leg raise negative.No pain with range of motion of either hip  Specialty Comments:  No specialty comments available.  Imaging: No results found.   PMFS History: Patient Active Problem List   Diagnosis Date Noted  . DDD (degenerative disc disease), lumbar 10/19/2016  . 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 Fibromyalgia  05/17/2016  . Other psoriasis 02/18/2016  . High risk medication use 02/18/2016  . DJD (degenerative joint disease), cervical 02/18/2016  . Heel spur 09/29/2015  . Ingrown toenail 09/29/2015  . Iron deficiency anemia due to chronic blood loss 08/14/2015  . Foraminal stenosis of cervical region 07/30/2015  . Microcytic hypochromic anemia 05/22/2015  . Tailor's bunion, left 01/18/2015  . H/O hiatal hernia   . Irritable bowel syndrome (IBS) 03/01/2014  . Bilateral primary osteoarthritis of knee 12/15/2013  . Iron deficiency 12/15/2013  . Psoriatic arthritis ([place]) 12/15/2013  . History of foot fracture 12/15/2013  . Dermatitis 10/17/2013  . 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 Arthritis 05/31/2013  . 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 Skin lesion 12/09/2012  . Tinnitus 06/23/2012  . Scalp lesion 01/21/2012  . Headache(784.0) 01/21/2012  . Hx of psoriasis 01/21/2012  . Weight gain 01/21/2012  . Ankylosing spondylitis  possible 09/03/2011  . Hx of transfusion 09/03/2011  . S/P lumbar fusion 6 13 09/03/2011  . Lumbar stenosis with neurogenic claudication 08/05/2011  . LBP (low back pain) 01/17/2011  . ALLERGIC RHINITIS 02/19/2010  . GERD 02/19/2010  . ARTHRITIS 02/19/2010  . SPINAL STENOSIS, CERVICAL 02/19/2010  . OSTEOPENIA 02/19/2010  . WEIGHT GAIN 12/18/2008  . RASH AND OTHER NONSPECIFIC SKIN ERUPTION 12/07/2007  . IBS 04/28/2007  . PALPITATIONS 02/09/2007  . History of gastric ulcer 02/09/2007  . COLONIC POLYPS, HX OF 02/09/2007  . REDUCTION MAMMOPLASTY, HX OF 02/09/2007  . CHOLECYSTECTOMY, HX OF 02/09/2007  . Hyperlipidemia 12/29/2006  . Essential hypertension 12/29/2006   Past Medical History:  Diagnosis Date  . Abnormal Pap smear    H/O  . Anemia    iron def  . Arthritis    rheumatoid and osteo now flt to be psoriateic   . Arthritis    psoreratic, osteoarthritis  . Broken toe   . Bruises easily   . CHOLECYSTECTOMY, HX OF     . Chronic back pain   . COLONIC POLYPS, HX OF 02/09/2007  . Cough, persistent 01/21/2012   poss from acei   . 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 Fibromyalgia    doesn't require meds  . GASTRIC ULCER, ACUTE, HEMORRHAGE, HX OF 02/09/2007  . GERD (gastroesophageal reflux disease)    takes Nexium daily  . Goiter   . H/O hiatal hernia   . Headache(784.0)    occasionally  . History of bronchitis   . Hx of seasonal allergies    takes Claritin daily  . HYPERLIPIDEMIA 12/29/2006   takes Lovastatin nightly  . HYPERTENSION 12/29/2006   takes Enalapril daily  . IBS 04/28/2007  . Insomnia    related to pain;takes Flexeril and Tylenol PM nightly  . Joint pain   . Joint swelling   . MENOPAUSAL DISORDER 12/07/2007  . Palpitations 02/09/2007  . PONV (postoperative nausea and vomiting)    NAUSEA  . REDUCTION MAMMOPLASTY, HX OF 02/09/2007  . Rheumatoid arthritis(714.0) 12/29/2006  . Right bundle branch block 02/09/2007  . S/P lumbar  fusion 6 13 09/03/2011   L4 L5  posterior   . SYNCOPE 12/07/2007  . UNS ADVRS EFF OTH RX MEDICINAL\T\BIOLOGICAL SBSTNC 02/09/2007  . UTI (lower urinary tract infection)    hx of;saw urologist last yr and he told her she was over reacting and not a big deal   . WEIGHT GAIN 12/18/2008    Family History  Problem Relation Age of Onset  . Diabetes Mother   . Colon cancer Mother        x2  . Arthritis Mother   . Hypertension Mother   . Endometrial cancer Mother   . Stroke Father   . Heart disease Father   . Colon cancer Father   . Arthritis Brother   . Hypertension Brother   . Hyperlipidemia Brother   . Other Unknown        DISH brother  . Hypertension Maternal Grandmother   . Stroke Maternal Grandmother   . Infertility Brother   . Anesthesia problems Neg Hx   . Hypotension Neg Hx   . Malignant hyperthermia Neg Hx   . Pseudochol deficiency Neg Hx     Past Surgical History:  Procedure Laterality Date  . ABDOMINAL HYSTERECTOMY  1986   TAH/BSO  .  BACK SURGERY  12+yrs ago   Synovial Cyst removal  . BACK SURGERY  6/13   lumbar L4-L5 fusion, lamenectomy  . blood transfused  6/13   transfused 2 units post op from back surgery  . bone spur  6-72yrs ago   right ankle  . bone spur Bilateral   . BREAST REDUCTION SURGERY     last 80's  . BUNIONECTOMY  10/03/14   right foot  . CERVICAL FUSION     2 12   . CHOLECYSTECTOMY  1996  . COLONOSCOPY    . ESOPHAGOGASTRODUODENOSCOPY ENDOSCOPY    . POSTERIOR CERVICAL LAMINECTOMY Left 07/30/2015   Procedure: Laminectomy and Foraminotomy - left - Cervical two -Cervical three;  Surgeon: Earnie Larsson, MD;  Location: [place] [facility];  Service: Neurosurgery;  Laterality: Left;  . REDUCTION MAMMAPLASTY Bilateral 1989  . TONSILLECTOMY     as a child   Social History   Occupational History  . Not on file.   Social History Main Topics  . Smoking status: Never Smoker  . Smokeless tobacco: Never Used  . Alcohol use No  . Drug use: No  . Sexual activity: No     Comment: TAH/BSO

## 2016-11-30 ENCOUNTER — Ambulatory Visit (INDEPENDENT_AMBULATORY_CARE_PROVIDER_SITE_OTHER): Payer: BLUE CROSS/BLUE SHIELD | Admitting: Internal Medicine

## 2016-11-30 ENCOUNTER — Encounter: Payer: Self-pay | Admitting: Internal Medicine

## 2016-11-30 VITALS — BP 118/76 | Temp 98.3°F | Ht 64.0 in | Wt 221.8 lb

## 2016-11-30 DIAGNOSIS — D899 Disorder involving the immune mechanism, unspecified: Secondary | ICD-10-CM | POA: Diagnosis not present

## 2016-11-30 DIAGNOSIS — Z8639 Personal history of other endocrine, nutritional and metabolic disease: Secondary | ICD-10-CM

## 2016-11-30 DIAGNOSIS — D849 Immunodeficiency, unspecified: Secondary | ICD-10-CM

## 2016-11-30 DIAGNOSIS — L4050 Arthropathic psoriasis, unspecified: Secondary | ICD-10-CM

## 2016-11-30 DIAGNOSIS — Z0000 Encounter for general adult medical examination without abnormal findings: Secondary | ICD-10-CM

## 2016-11-30 DIAGNOSIS — R7301 Impaired fasting glucose: Secondary | ICD-10-CM

## 2016-11-30 DIAGNOSIS — E611 Iron deficiency: Secondary | ICD-10-CM

## 2016-11-30 DIAGNOSIS — I10 Essential (primary) hypertension: Secondary | ICD-10-CM | POA: Diagnosis not present

## 2016-11-30 DIAGNOSIS — Z79899 Other long term (current) drug therapy: Secondary | ICD-10-CM | POA: Diagnosis not present

## 2016-11-30 DIAGNOSIS — E785 Hyperlipidemia, unspecified: Secondary | ICD-10-CM | POA: Diagnosis not present

## 2016-11-30 DIAGNOSIS — Z23 Encounter for immunization: Secondary | ICD-10-CM

## 2016-11-30 LAB — CBC WITH DIFFERENTIAL/PLATELET
Basophils Absolute: 0.1 10*3/uL (ref 0.0–0.1)
Basophils Relative: 0.8 % (ref 0.0–3.0)
Eosinophils Absolute: 0.1 10*3/uL (ref 0.0–0.7)
Eosinophils Relative: 0.9 % (ref 0.0–5.0)
HCT: 41.6 % (ref 36.0–46.0)
Hemoglobin: 14.4 g/dL (ref 12.0–15.0)
Lymphocytes Relative: 27 % (ref 12.0–46.0)
Lymphs Abs: 2.1 10*3/uL (ref 0.7–4.0)
MCHC: 34.5 g/dL (ref 30.0–36.0)
MCV: 101.1 fl — ABNORMAL HIGH (ref 78.0–100.0)
Monocytes Absolute: 0.4 10*3/uL (ref 0.1–1.0)
Monocytes Relative: 4.8 % (ref 3.0–12.0)
Neutro Abs: 5.1 10*3/uL (ref 1.4–7.7)
Neutrophils Relative %: 66.5 % (ref 43.0–77.0)
Platelets: 381 10*3/uL (ref 150.0–400.0)
RBC: 4.12 Mil/uL (ref 3.87–5.11)
RDW: 12.7 % (ref 11.5–15.5)
WBC: 7.7 10*3/uL (ref 4.0–10.5)

## 2016-11-30 LAB — LIPID PANEL
Cholesterol: 196 mg/dL (ref 0–200)
HDL: 65.8 mg/dL (ref >39.00)
LDL Cholesterol: 97 mg/dL (ref 0–99)
NonHDL: 130.05
Total CHOL/HDL Ratio: 3
Triglycerides: 166 mg/dL — ABNORMAL HIGH (ref 0.0–149.0)
VLDL: 33.2 mg/dL (ref 0.0–40.0)

## 2016-11-30 LAB — IRON,TIBC AND FERRITIN PANEL
%SAT: 39 % (calc) (ref 11–50)
Ferritin: 549 ng/mL — ABNORMAL HIGH (ref 20–288)
Iron: 127 ug/dL (ref 45–160)
TIBC: 322 mcg/dL (calc) (ref 250–450)

## 2016-11-30 LAB — HEMOGLOBIN A1C: Hgb A1c MFr Bld: 5.4 % (ref 4.6–6.5)

## 2016-11-30 LAB — TSH: TSH: 1.18 u[IU]/mL (ref 0.35–4.50)

## 2016-11-30 NOTE — Patient Instructions (Addendum)
Can get shingrix  At any time .  Will notify you  of labs when available.  PPD ok .  If bp is low 100 and lightheaded we can decrease dose of medication.  Goal is 120/80 range .       Preventive Care 40-64 Years, Female Preventive care refers to lifestyle choices and visits with your health care provider that can promote health and wellness. What does preventive care include?  A yearly physical exam. This is also called an annual well check.  Dental exams once or twice a year.  Routine eye exams. Ask your health care provider how often you should have your eyes checked.  Personal lifestyle choices, including: ? Daily care of your teeth and gums. ? Regular physical activity. ? Eating a healthy diet. ? Avoiding tobacco and drug use. ? Limiting alcohol use. ? Practicing safe sex. ? Taking low-dose aspirin daily starting at age 50. ? Taking vitamin and mineral supplements as recommended by your health care provider. What happens during an annual well check? The services and screenings done by your health care provider during your annual well check will depend on your age, overall health, lifestyle risk factors, and family history of disease. Counseling Your health care provider may ask you questions about your:  Alcohol use.  Tobacco use.  Drug use.  Emotional well-being.  Home and relationship well-being.  Sexual activity.  Eating habits.  Work and work Statistician.  Method of birth control.  Menstrual cycle.  Pregnancy history.  Screening You may have the following tests or measurements:  Height, weight, and BMI.  Blood pressure.  Lipid and cholesterol levels. These may be checked every 5 years, or more frequently if you are over 62 years old.  Skin check.  Lung cancer screening. You may have this screening every year starting at age 41 if you have a 30-pack-year history of smoking and currently smoke or have quit within the past 15 years.  Fecal  occult blood test (FOBT) of the stool. You may have this test every year starting at age 47.  Flexible sigmoidoscopy or colonoscopy. You may have a sigmoidoscopy every 5 years or a colonoscopy every 10 years starting at age 68.  Hepatitis C blood test.  Hepatitis B blood test.  Sexually transmitted disease (STD) testing.  Diabetes screening. This is done by checking your blood sugar (glucose) after you have not eaten for a while (fasting). You may have this done every 1-3 years.  Mammogram. This may be done every 1-2 years. Talk to your health care provider about when you should start having regular mammograms. This may depend on whether you have a family history of breast cancer.  BRCA-related cancer screening. This may be done if you have a family history of breast, ovarian, tubal, or peritoneal cancers.  Pelvic exam and Pap test. This may be done every 3 years starting at age 40. Starting at age 70, this may be done every 5 years if you have a Pap test in combination with an HPV test.  Bone density scan. This is done to screen for osteoporosis. You may have this scan if you are at high risk for osteoporosis.  Discuss your test results, treatment options, and if necessary, the need for more tests with your health care provider. Vaccines Your health care provider may recommend certain vaccines, such as:  Influenza vaccine. This is recommended every year.  Tetanus, diphtheria, and acellular pertussis (Tdap, Td) vaccine. You may need a Td booster  every 10 years.  Varicella vaccine. You may need this if you have not been vaccinated.  Zoster vaccine. You may need this after age 36.  Measles, mumps, and rubella (MMR) vaccine. You may need at least one dose of MMR if you were born in 1957 or later. You may also need a second dose.  Pneumococcal 13-valent conjugate (PCV13) vaccine. You may need this if you have certain conditions and were not previously vaccinated.  Pneumococcal  polysaccharide (PPSV23) vaccine. You may need one or two doses if you smoke cigarettes or if you have certain conditions.  Meningococcal vaccine. You may need this if you have certain conditions.  Hepatitis A vaccine. You may need this if you have certain conditions or if you travel or work in places where you may be exposed to hepatitis A.  Hepatitis B vaccine. You may need this if you have certain conditions or if you travel or work in places where you may be exposed to hepatitis B.  Haemophilus influenzae type b (Hib) vaccine. You may need this if you have certain conditions.  Talk to your health care provider about which screenings and vaccines you need and how often you need them. This information is not intended to replace advice given to you by your health care provider. Make sure you discuss any questions you have with your health care provider. Document Released: 03/15/2015 Document Revised: 11/06/2015 Document Reviewed: 12/18/2014 Elsevier Interactive Patient Education  2017 [employer].

## 2016-11-30 NOTE — Progress Notes (Signed)
 Chief Complaint  Patient presents with  . Annual Exam    Pt states she has IBS again, she states she is still having trouble in her knees  . Immunizations    Pt brought in rx from [facility] stating she needs a PPD     HPI: Patient  Jade Cardenas  66 y.o. comes in today for Preventive Health Care visit  And Chronic disease management   GI:On lotrinex and added probiotic .  ibs not as good .  recently  MS: Gel /shots  Per  Ortho rheum strain  m and cmp no acute findings  Ice and voltaren gel used   RA simponi and  mtx  Needs ppd   No active bleeding due for iron levels   Bp no se of med noted   Readings   LIPID:  On meds   No depression  Still  "working"   In community   Health Maintenance Review LIFESTYLE:  Exercise:    Knee limtiation staying active  Tobacco/ETS: no Alcohol:  no Sugar beverages: Sleep:  Drug use: nabout 6 o   HH of  1 plus pets  Work: about 10 hours   Commitment .     ROS:  See above  GEN/ HEENT: No fever, significant weight changes sweats headaches vision problems hearing changes, CV/ PULM; No chest pain shortness of breath cough, syncope,edema  change in exercise tolerance. GI /GU:  No blood in the stool. No significant GU symptoms. SKIN/HEME: ,no acute skin rashes suspicious lesions or bleeding. No lymphadenopathy, nodules, masses.  NEURO/ PSYCH:  No neurologic signs such as weakness numbness. No depression anxiety. IMM/ Allergy: No unusual infections.  Allergy .   REST of 12 system review negative except as per HPI   Past Medical History:  Diagnosis Date  . Abnormal Pap smear    H/O  . Anemia    iron def  . Arthritis    rheumatoid and osteo now flt to be psoriateic   . Arthritis    psoreratic, osteoarthritis  . Broken toe   . Bruises easily   . CHOLECYSTECTOMY, HX OF   . Chronic back pain   . COLONIC POLYPS, HX OF 02/09/2007  . Cough, persistent 01/21/2012   poss from acei   . DES exposure in utero, unknown     . Fibromyalgia    doesn't require meds  . GASTRIC ULCER, ACUTE, HEMORRHAGE, HX OF 02/09/2007  . GERD (gastroesophageal reflux disease)    takes Nexium daily  . Goiter   . H/O hiatal hernia   . Headache(784.0)    occasionally  . History of bronchitis   . Hx of seasonal allergies    takes Claritin daily  . HYPERLIPIDEMIA 12/29/2006   takes Lovastatin nightly  . HYPERTENSION 12/29/2006   takes Enalapril daily  . IBS 04/28/2007  . Insomnia    related to pain;takes Flexeril and Tylenol PM nightly  . Joint pain   . Joint swelling   . MENOPAUSAL DISORDER 12/07/2007  . Palpitations 02/09/2007  . PONV (postoperative nausea and vomiting)    NAUSEA  . REDUCTION MAMMOPLASTY, HX OF 02/09/2007  . Rheumatoid arthritis(714.0) 12/29/2006  . Right bundle branch block 02/09/2007  . S/P lumbar fusion 6 13 09/03/2011   L4 L5  posterior   . SYNCOPE 12/07/2007  . UNS ADVRS EFF OTH RX MEDICINAL\T\BIOLOGICAL SBSTNC 02/09/2007  . UTI (lower urinary tract infection)    hx of;saw urologist last yr and he told her   she was over reacting and not a big deal   . WEIGHT GAIN 12/18/2008    Past Surgical History:  Procedure Laterality Date  . ABDOMINAL HYSTERECTOMY  1986   TAH/BSO  . BACK SURGERY  12+yrs ago   Synovial Cyst removal  . BACK SURGERY  6/13   lumbar L4-L5 fusion, lamenectomy  . blood transfused  6/13   transfused 2 units post op from back surgery  . bone spur  6-7yrs ago   right ankle  . bone spur Bilateral   . BREAST REDUCTION SURGERY     last 80's  . BUNIONECTOMY  10/03/14   right foot  . CERVICAL FUSION     2 12   . CHOLECYSTECTOMY  1996  . COLONOSCOPY    . ESOPHAGOGASTRODUODENOSCOPY ENDOSCOPY    . POSTERIOR CERVICAL LAMINECTOMY Left 07/30/2015   Procedure: Laminectomy and Foraminotomy - left - Cervical two -Cervical three;  Surgeon: Henry Pool, MD;  Location: [facility];  Service: Neurosurgery;  Laterality: Left;  . REDUCTION MAMMAPLASTY Bilateral 1989  . TONSILLECTOMY     as  a child    Family History  Problem Relation Age of Onset  . Diabetes Mother   . Colon cancer Mother        x2  . Arthritis Mother   . Hypertension Mother   . Endometrial cancer Mother   . Stroke Father   . Heart disease Father   . Colon cancer Father   . Arthritis Brother   . Hypertension Brother   . Hyperlipidemia Brother   . Other Unknown        DISH brother  . Hypertension Maternal Grandmother   . Stroke Maternal Grandmother   . Infertility Brother   . Anesthesia problems Neg Hx   . Hypotension Neg Hx   . Malignant hyperthermia Neg Hx   . Pseudochol deficiency Neg Hx     Social History   Social History  . Marital status: Widowed    Spouse name: N/A  . Number of children: N/A  . Years of education: N/A   Social History Main Topics  . Smoking status: Never Smoker  . Smokeless tobacco: Never Used  . Alcohol use No  . Drug use: No  . Sexual activity: No     Comment: TAH/BSO   Other Topics Concern  . 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 acetaminophen (TYLENOL) 650 MG CR tablet Take 650 mg by mouth as needed for pain.    . alosetron (LOTRONEX) 0.5 MG tablet Take 0.5 mg by mouth 2 (two) times daily.  0  . amLODipine (NORVASC) 5 MG tablet TAKE 1 TABLET (5 MG TOTAL) BY MOUTH DAILY. 90 tablet 3  . B-D ALLERGY SYRINGE 1CC/28G 28G X 1/2" 1 ML MISC USE AS DIRECTED ONCE WEEKLY  4  . Cholecalciferol (VITAMIN D) 2000 units tablet Take 2,000 Units by mouth daily.    . clobetasol ointment (TEMOVATE) 0.05 % APPLY TWICE A DAY 45 g 1  . Diphenhydramine-APAP, sleep, (TYLENOL PM EXTRA STRENGTH PO) Take 2 tablets by mouth at bedtime as needed. For sleep    . esomeprazole (NEXIUM) 40 MG capsule Take 1 capsule (40 mg total) by mouth daily before breakfast. 30 capsule 11   .   folic acid (FOLVITE) 1 MG tablet Take 1 tablet (1 mg total) by mouth daily. 90 tablet 4  . losartan (COZAAR) 100 MG tablet TAKE 1 TABLET (100 MG TOTAL) BY MOUTH DAILY. 90 tablet 3  . lovastatin (MEVACOR) 20 MG tablet TAKE 1 TABLET (20 MG TOTAL) BY MOUTH AT BEDTIME. 90 tablet 1  . methotrexate 50 MG/2ML injection Inject 0.5 mLs (12.5 mg total) into the skin once a week. INJECT 0.5 MLS (12.5 MG TOTAL) INTO THE SKIN ONCE WEEKLY (Patient taking differently: Inject 12.5 mg into the skin once a week. INJECT 0.4 MLS (12.5 MG TOTAL) INTO THE SKIN ONCE WEEKLY) 8 mL 0  . Multiple Vitamin (MULTIVITAMIN) tablet Take 1 tablet by mouth daily.      . Probiotic Product (VSL#3 PO) Take 1 Units by mouth 2 (two) times daily.    . SIMPONI 50 MG/0.5ML SOAJ INJECT 50MG (1 PEN) UNDER THE SKIN ONCE MONTHLY 3 Syringe 0   No facility-administered medications prior to visit.      EXAM:  BP 118/76 (BP Location: Right Arm, Patient Position: Sitting, Cuff Size: Large)   Temp 98.3 F (36.8 C) (Oral)   Ht 5' 4" (1.626 m)   Wt 221 lb 12.8 oz (100.6 kg)   LMP 03/02/1984   BMI 38.07 kg/m   Body mass index is 38.07 kg/m. Wt Readings from Last 3 Encounters:  11/30/16 221 lb 12.8 oz (100.6 kg)  11/24/16 222 lb (100.7 kg)  11/04/16 222 lb 6.4 oz (100.9 kg)    Physical Exam: Vital signs reviewed GEN:This is a well-developed well-nourished alert cooperative    who appearsr stated age in no acute distress.  HEENT: normocephalic atraumatic , Eyes: PERRL EOM's full, conjunctiva clear, Nares: paten,t no deformity discharge or tenderness., Ears: no deformity EAC's clear TMs with normal landmarks. Mouth: clear OP, no lesions, edema.  Moist mucous membranes. Dentition in adequate repair. NECK: supple without masses, thyromegaly or bruits. CHEST/PULM:  Clear to auscultation and percussion breath sounds equal no wheeze , rales or rhonchi. No chest wall deformities or tenderness. Breast: normal by inspection . No dimpling,  discharge, masses, tenderness or discharge . CV: PMI is nondisplaced, S1 S2 no gallops, murmurs, rubs. Peripheral pulses are full without delay.No JVD .  ABDOMEN: Bowel sounds normal nontender  No guard or rebound, no hepato splenomegal no CVA tenderness.  No hernia. Extremtities:  No clubbing cyanosis or edema, no acute joint swelling or redness no focal atrophy some antalgic gait  NEURO:  Oriented x3, cranial nerves 3-12 appear to be intact, no obvious focal weakness,no abnormal reflexes  SKIN: No acute rashes normal turgor, color, no bruising or petechiae. PSYCH: Oriented, good eye contact, no obvious depression anxiety, cognition and judgment appear normal. LN: no cervical axillary inguinal adenopathy  Lab Results  Component Value Date   WBC 7.7 11/30/2016   HGB 14.4 11/30/2016   HCT 41.6 11/30/2016   PLT 381.0 11/30/2016   GLUCOSE 95 10/28/2016   CHOL 196 11/30/2016   TRIG 166.0 (H) 11/30/2016   HDL 65.80 11/30/2016   LDLDIRECT 114.0 04/02/2010   LDLCALC 97 11/30/2016   ALT 23 10/28/2016   AST 19 10/28/2016   NA 140 10/28/2016   K 4.3 10/28/2016   CL 104 08/05/2016   CREATININE 0.8 10/28/2016   BUN 17.9 10/28/2016   CO2 29 10/28/2016   TSH 1.18 11/30/2016   HGBA1C 5.4 11/30/2016    BP Readings from Last 3 Encounters:  11/30/16 118/76  11/24/16 (!) 141/71    11/04/16 (!) 149/52   Wt Readings from Last 3 Encounters:  11/30/16 221 lb 12.8 oz (100.6 kg)  11/24/16 222 lb (100.7 kg)  11/04/16 222 lb 6.4 oz (100.9 kg)     Lab results reviewed with patient   ASSESSMENT AND PLAN:  Discussed the following assessment and plan:  Visit for preventive health examination  Essential hypertension - Plan: Lipid panel, TSH, CBC with Differential/Platelet, Iron, TIBC and Ferritin Panel, Hemoglobin A1c  High risk medication use - Plan: Flu vaccine HIGH DOSE PF (Fluzone High dose), Lipid panel, TSH, CBC with Differential/Platelet, Iron, TIBC and Ferritin Panel, Hemoglobin  A1c  History of iron deficiency  Need for prophylactic vaccination and inoculation against influenza - Plan: Flu vaccine HIGH DOSE PF (Fluzone High dose)  Immunocompromised patient ([place]) high  risk medication  simponi - Plan: Lipid panel, TSH, CBC with Differential/Platelet, Iron, TIBC and Ferritin Panel, Hemoglobin A1c, TB Skin Test  Hyperlipidemia, unspecified hyperlipidemia type - Plan: Lipid panel, TSH, CBC with Differential/Platelet, Iron, TIBC and Ferritin Panel, Hemoglobin A1c  Psoriatic arthritis ([place]) - Plan: Lipid panel, TSH, CBC with Differential/Platelet, Iron, TIBC and Ferritin Panel, Hemoglobin A1c  Fasting hyperglycemia - Plan: Lipid panel, TSH, CBC with Differential/Platelet, Iron, TIBC and Ferritin Panel, Hemoglobin A1c  Iron deficiency - Plan: CBC with Differential/Platelet, Iron, TIBC and Ferritin Panel  Immunosuppression ([place]) - Plan: TB Skin Test    shingrix flu  PPD    Done today   Lab    monitoring and iron studies   Patient Care Team: Melisha Eggleton, Standley Brooking, MD as PCP - General Richmond Campbell, MD (Gastroenterology) Megan Salon, MD (Gynecology) Bo Merino, MD as Consulting Physician (Rheumatology) Brunetta Genera, MD as Consulting Physician (Hematology) Patient Instructions  Can get shingrix  At any time .  Will notify you  of labs when available.  PPD ok .  If bp is low 100 and lightheaded we can decrease dose of medication.  Goal is 120/80 range .       Preventive Care 40-64 Years, Female Preventive care refers to lifestyle choices and visits with your health care provider that can promote health and wellness. What does preventive care include?  A yearly physical exam. This is also called an annual well check.  Dental exams once or twice a year.  Routine eye exams. Ask your health care provider how often you should have your eyes checked.  Personal lifestyle choices, including: ? Daily care of your teeth and gums. ? Regular physical  activity. ? Eating a healthy diet. ? Avoiding tobacco and drug use. ? Limiting alcohol use. ? Practicing safe sex. ? Taking low-dose aspirin daily starting at age 57. ? Taking vitamin and mineral supplements as recommended by your health care provider. What happens during an annual well check? The services and screenings done by your health care provider during your annual well check will depend on your age, overall health, lifestyle risk factors, and family history of disease. Counseling Your health care provider may ask you questions about your:  Alcohol use.  Tobacco use.  Drug use.  Emotional well-being.  Home and relationship well-being.  Sexual activity.  Eating habits.  Work and work Statistician.  Method of birth control.  Menstrual cycle.  Pregnancy history.  Screening You may have the following tests or measurements:  Height, weight, and BMI.  Blood pressure.  Lipid and cholesterol levels. These may be checked every 5 years, or more frequently if you are over 65 years old.  Skin check.  Lung cancer screening. You may have this screening every year starting at age 37 if you have a 30-pack-year history of smoking and currently smoke or have quit within the past 15 years.  Fecal occult blood test (FOBT) of the stool. You may have this test every year starting at age 41.  Flexible sigmoidoscopy or colonoscopy. You may have a sigmoidoscopy every 5 years or a colonoscopy every 10 years starting at age 29.  Hepatitis C blood test.  Hepatitis B blood test.  Sexually transmitted disease (STD) testing.  Diabetes screening. This is done by checking your blood sugar (glucose) after you have not eaten for a while (fasting). You may have this done every 1-3 years.  Mammogram. This may be done every 1-2 years. Talk to your health care provider about when you should start having regular mammograms. This may depend on whether you have a family history of breast  cancer.  BRCA-related cancer screening. This may be done if you have a family history of breast, ovarian, tubal, or peritoneal cancers.  Pelvic exam and Pap test. This may be done every 3 years starting at age 84. Starting at age 47, this may be done every 5 years if you have a Pap test in combination with an HPV test.  Bone density scan. This is done to screen for osteoporosis. You may have this scan if you are at high risk for osteoporosis.  Discuss your test results, treatment options, and if necessary, the need for more tests with your health care provider. Vaccines Your health care provider may recommend certain vaccines, such as:  Influenza vaccine. This is recommended every year.  Tetanus, diphtheria, and acellular pertussis (Tdap, Td) vaccine. You may need a Td booster every 10 years.  Varicella vaccine. You may need this if you have not been vaccinated.  Zoster vaccine. You may need this after age 66.  Measles, mumps, and rubella (MMR) vaccine. You may need at least one dose of MMR if you were born in 1957 or later. You may also need a second dose.  Pneumococcal 13-valent conjugate (PCV13) vaccine. You may need this if you have certain conditions and were not previously vaccinated.  Pneumococcal polysaccharide (PPSV23) vaccine. You may need one or two doses if you smoke cigarettes or if you have certain conditions.  Meningococcal vaccine. You may need this if you have certain conditions.  Hepatitis A vaccine. You may need this if you have certain conditions or if you travel or work in places where you may be exposed to hepatitis A.  Hepatitis B vaccine. You may need this if you have certain conditions or if you travel or work in places where you may be exposed to hepatitis B.  Haemophilus influenzae type b (Hib) vaccine. You may need this if you have certain conditions.  Talk to your health care provider about which screenings and vaccines you need and how often you need  them. This information is not intended to replace advice given to you by your health care provider. Make sure you discuss any questions you have with your health care provider. Document Released: 03/15/2015 Document Revised: 11/06/2015 Document Reviewed: 12/18/2014 Elsevier Interactive Patient Education  2017 [place] K. Srinivas Lippman M.D.

## 2016-12-03 ENCOUNTER — Ambulatory Visit (INDEPENDENT_AMBULATORY_CARE_PROVIDER_SITE_OTHER): Payer: BLUE CROSS/BLUE SHIELD

## 2016-12-03 DIAGNOSIS — Z23 Encounter for immunization: Secondary | ICD-10-CM | POA: Diagnosis not present

## 2016-12-03 LAB — TB SKIN TEST
Induration: 0 mm
TB Skin Test: NEGATIVE

## 2016-12-04 ENCOUNTER — Encounter: Payer: BLUE CROSS/BLUE SHIELD | Admitting: Internal Medicine

## 2016-12-06 ENCOUNTER — Other Ambulatory Visit: Payer: Self-pay | Admitting: Rheumatology

## 2016-12-07 NOTE — Telephone Encounter (Signed)
Last Visit: 10/21/16 Next Visit: 02/16/17  Okay to refill per Dr. Rob Hickman

## 2016-12-25 ENCOUNTER — Encounter: Payer: Self-pay | Admitting: Internal Medicine

## 2017-01-04 ENCOUNTER — Ambulatory Visit (INDEPENDENT_AMBULATORY_CARE_PROVIDER_SITE_OTHER): Payer: BLUE CROSS/BLUE SHIELD | Admitting: Vascular Surgery

## 2017-01-04 VITALS — BP 123/70 | HR 91 | Temp 98.1°F | Resp 16 | Ht 65.0 in | Wt 222.0 lb

## 2017-01-04 DIAGNOSIS — I83893 Varicose veins of bilateral lower extremities with other complications: Secondary | ICD-10-CM | POA: Insufficient documentation

## 2017-01-04 NOTE — Progress Notes (Signed)
Subjective:     Patient ID: Jade Cardenas, female   DOB: 04-01-1950, 65 y.o.   MRN: [id number]  HPI This 66 year old female returns for 3 month follow-up regarding bilateral painful varicosities. She was evaluated by Dr. Juanda Crumble fields 3 months ago. She is tried long-leg elastic compression stockings, elevation, and ibuprofen without success. She has severe osteoarthritis in her left knee and probably needs a knee replacement in the near future. She has had knee injections by Dr. Durward Fortes. She fell a few weeks ago and injured her left leg.  Past Medical History:  Diagnosis Date  . Abnormal Pap smear    H/O  . Anemia    iron def  . Arthritis    rheumatoid and osteo now flt to be psoriateic   . Arthritis    psoreratic, osteoarthritis  . Broken toe   . Bruises easily   . CHOLECYSTECTOMY, HX OF   . Chronic back pain   . COLONIC POLYPS, HX OF 02/09/2007  . Cough, persistent 01/21/2012   poss from acei   . 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 Fibromyalgia    doesn't require meds  . GASTRIC ULCER, ACUTE, HEMORRHAGE, HX OF 02/09/2007  . GERD (gastroesophageal reflux disease)    takes Nexium daily  . Goiter   . H/O hiatal hernia   . Headache(784.0)    occasionally  . History of bronchitis   . Hx of seasonal allergies    takes Claritin daily  . HYPERLIPIDEMIA 12/29/2006   takes Lovastatin nightly  . HYPERTENSION 12/29/2006   takes Enalapril daily  . IBS 04/28/2007  . Insomnia    related to pain;takes Flexeril and Tylenol PM nightly  . Joint pain   . Joint swelling   . MENOPAUSAL DISORDER 12/07/2007  . Palpitations 02/09/2007  . PONV (postoperative nausea and vomiting)    NAUSEA  . REDUCTION MAMMOPLASTY, HX OF 02/09/2007  . Rheumatoid arthritis(714.0) 12/29/2006  . Right bundle branch block 02/09/2007  . S/P lumbar fusion 6 13 09/03/2011   L4 L5  posterior   . SYNCOPE 12/07/2007  . UNS ADVRS EFF OTH RX MEDICINAL\T\BIOLOGICAL SBSTNC 02/09/2007  . UTI (lower urinary tract  infection)    hx of;saw urologist last yr and he told her she was over reacting and not a big deal   . WEIGHT GAIN 12/18/2008    Social History   Tobacco Use  . Smoking status: Never Smoker  . Smokeless tobacco: Never Used  Substance Use Topics  . Alcohol use: No    Alcohol/week: 0.0 oz    Family History  Problem Relation Age of Onset  . Diabetes Mother   . Colon cancer Mother        x2  . Arthritis Mother   . Hypertension Mother   . Endometrial cancer Mother   . Stroke Father   . Heart disease Father   . Colon cancer Father   . Arthritis Brother   . Hypertension Brother   . Hyperlipidemia Brother   . Other Unknown        DISH brother  . Hypertension Maternal Grandmother   . Stroke Maternal Grandmother   . Infertility Brother   . Anesthesia problems Neg Hx   . Hypotension Neg Hx   . Malignant hyperthermia Neg Hx   . Pseudochol deficiency Neg Hx     Allergies  Allergen Reactions  . Erythromycin Hives, Swelling and Other (See Comments)    All Mycins; throat swells shut and she gets "  really hot"  . Iodinated Diagnostic Agents Swelling    CT contrast; throat swelling, difficulty breathing.  In [place] "many years ago."  . Ace Inhibitors Cough  . Dilaudid [Hydromorphone Hcl]   . Morphine And Related   . Keflex [Cephalexin] Hives    hives  . Macrobid [Nitrofurantoin] Other (See Comments)    dizziness     Current Outpatient Medications:  .  acetaminophen (TYLENOL) 650 MG CR tablet, Take 650 mg by mouth as needed for pain., Disp: , Rfl:  .  alosetron (LOTRONEX) 0.5 MG tablet, Take 0.5 mg by mouth 2 (two) times daily., Disp: , Rfl: 0 .  amLODipine (NORVASC) 5 MG tablet, TAKE 1 TABLET (5 MG TOTAL) BY MOUTH DAILY., Disp: 90 tablet, Rfl: 3 .  B-D ALLERGY SYRINGE 1CC/28G 28G X 1/2" 1 ML MISC, USE AS DIRECTED ONCE WEEKLY, Disp: 12 each, Rfl: 3 .  Cholecalciferol (VITAMIN D) 2000 units tablet, Take 2,000 Units by mouth daily., Disp: , Rfl:  .  Diphenhydramine-APAP,  sleep, (TYLENOL PM EXTRA STRENGTH PO), Take 2 tablets by mouth at bedtime as needed. For sleep, Disp: , Rfl:  .  esomeprazole (NEXIUM) 40 MG capsule, Take 1 capsule (40 mg total) by mouth daily before breakfast., Disp: 30 capsule, Rfl: 11 .  folic acid (FOLVITE) 1 MG tablet, Take 1 tablet (1 mg total) by mouth daily., Disp: 90 tablet, Rfl: 4 .  losartan (COZAAR) 100 MG tablet, TAKE 1 TABLET (100 MG TOTAL) BY MOUTH DAILY., Disp: 90 tablet, Rfl: 3 .  lovastatin (MEVACOR) 20 MG tablet, TAKE 1 TABLET (20 MG TOTAL) BY MOUTH AT BEDTIME., Disp: 90 tablet, Rfl: 1 .  methotrexate 50 MG/2ML injection, Inject 0.5 mLs (12.5 mg total) into the skin once a week. INJECT 0.5 MLS (12.5 MG TOTAL) INTO THE SKIN ONCE WEEKLY (Patient taking differently: Inject 12.5 mg into the skin once a week. INJECT 0.4 MLS (12.5 MG TOTAL) INTO THE SKIN ONCE WEEKLY), Disp: 8 mL, Rfl: 0 .  Multiple Vitamin (MULTIVITAMIN) tablet, Take 1 tablet by mouth daily.  , Disp: , Rfl:  .  Probiotic Product (VSL#3 PO), Take 1 Units by mouth 2 (two) times daily., Disp: , Rfl:  .  SIMPONI 50 MG/0.5ML SOAJ, INJECT 50MG  (1 PEN) UNDER THE SKIN ONCE MONTHLY, Disp: 3 Syringe, Rfl: 0 .  clobetasol ointment (TEMOVATE) 0.05 %, APPLY TWICE A DAY (Patient not taking: Reported on 01/04/2017), Disp: 45 g, Rfl: 1  Vitals:   01/04/17 1340  BP: 123/70  Pulse: 91  Resp: 16  Temp: 98.1 F (36.7 C)  SpO2: 98%  Weight: 222 lb (100.7 kg)  Height: 5\' 5"  (1.651 m)    Body mass index is 36.94 kg/m.         Review of Systems Denies chest pain, dyspnea on exertion, PND, orthopnea, hemoptysis. See history of present illness regarding osteoarthritis left knee    Objective:   Physical Exam BP 123/70 (BP Location: Left Arm, Patient Position: Sitting, Cuff Size: Large)   Pulse 91   Temp 98.1 F (36.7 C)   Resp 16   Ht 5\' 5"  (1.651 m)   Wt 222 lb (100.7 kg)   LMP 03/02/1984   SpO2 98%   BMI 36.94 kg/m   Well-developed obese female no apparent  distress alert and oriented 3 Lungs no rhonchi or wheezing Both legs with diffuse spider and reticular veins particularly in the posterior calf posterior thigh area. Minimal distal edema at this time. No hyperpigmentation or ulceration noted.  No bulging varicosities noted.  I reviewed the reflux exam of the lower extremities which was performed in our office 10/01/2016 which reveals relatively normal-sized bilateral great and small saphenous veins with occasional reflux     Assessment:     #1 bilateral spider and reticular veins with no evidence of significant enlargement or reflux in bilateral great or small saphenous systems #2 severe osteoarthritis left knee    Plan:     Have recommended elevation and compression stockings. Have explained patient she does not need any intervention on her venous system if she would like treatment of the reticular and spider veins would consist of foam sclerotherapy which I did not recommend because of the diffuse nature of her problem Return to see me on a when necessary basis

## 2017-01-07 ENCOUNTER — Encounter: Payer: Self-pay | Admitting: Rheumatology

## 2017-01-07 NOTE — Telephone Encounter (Signed)
Ok to dc MTX

## 2017-01-11 ENCOUNTER — Other Ambulatory Visit: Payer: Self-pay | Admitting: Rheumatology

## 2017-01-11 NOTE — Telephone Encounter (Signed)
Last Visit: 10/21/16 Next Visit: 02/16/17 Labs: 10/28/16 WNL TB Skin Test 12/03/16 Neg  Okay to refill per Dr. Estanislado Pandy

## 2017-01-25 ENCOUNTER — Ambulatory Visit (INDEPENDENT_AMBULATORY_CARE_PROVIDER_SITE_OTHER): Payer: BLUE CROSS/BLUE SHIELD | Admitting: Orthopaedic Surgery

## 2017-01-25 ENCOUNTER — Ambulatory Visit (INDEPENDENT_AMBULATORY_CARE_PROVIDER_SITE_OTHER): Payer: Self-pay

## 2017-01-25 ENCOUNTER — Encounter (INDEPENDENT_AMBULATORY_CARE_PROVIDER_SITE_OTHER): Payer: Self-pay | Admitting: Orthopaedic Surgery

## 2017-01-25 DIAGNOSIS — M25552 Pain in left hip: Secondary | ICD-10-CM | POA: Diagnosis not present

## 2017-01-25 MED ORDER — LIDOCAINE HCL 1 % IJ SOLN
5.0000 mL | INTRAMUSCULAR | Status: AC | PRN
  Administered 2017-01-25: 5 mL

## 2017-01-25 MED ORDER — METHYLPREDNISOLONE ACETATE 40 MG/ML IJ SUSP
80.0000 mg | INTRAMUSCULAR | Status: AC | PRN
  Administered 2017-01-25: 80 mg

## 2017-01-25 NOTE — Progress Notes (Signed)
Office Visit Note   Patient: Jade Cardenas           Date of Birth: Jan 05, 1951           MRN: [id number] Visit Date: 01/25/2017              Requested by: [name], MD [place], [name] [address] PCP: [name], MD   Assessment & Plan: Visit Diagnoses:  1. Pain in left hip     Plan: Pain left knee out of proportion to x-ray findings and MRI scan. He does have chondromalacia patella was some discomfort with deep knee bends and squats. Usually going to a workout facility. No change in weight but is aware that she needs to lose some weight. We'll try repeat cortisone injection and monitor her response  Follow-Up Instructions: No Follow-up on file.   Orders:  Orders Placed This Encounter  Procedures  . XR Pelvis 1-2 Views   No orders of the defined types were placed in this encounter.     Procedures: Large Joint Inj: L knee on 01/25/2017 2:53 PM Details: 25 G 1.5 in needle, medial approach  Arthrogram: No  Medications: 5 mL lidocaine 1 %; 80 mg methylPREDNISolone acetate 40 MG/ML      Clinical Data: No additional findings.   Subjective: Chief Complaint  Patient presents with  . Left Knee - Pain, Edema    Ms. Margerum is a 66 y o that is here for F/U of Left knee pain. She relates the last injection on 11/24/16 and it had minimal relief. She now has to rely on others to help her up out of a chair.Ambulates with a cane  Last seen 2 months ago for evaluation of left knee pain. Cortisone injection provided minimal relief of discomfort. Had MRI scan in August with some minimal degenerative changes about the patella. Has history of autoimmune arthritis being followed by Dr. Estanislado Pandy. No recent injury or trauma. Has had extensive back surgery but no Pres. back symptoms.  HPI  Review of Systems  Constitutional: Negative for chills, fatigue and fever.  Eyes: Negative for itching.  Respiratory: Negative for chest tightness and  shortness of breath.   Cardiovascular: Positive for leg swelling. Negative for chest pain and palpitations.  Gastrointestinal: Negative for blood in stool, constipation and diarrhea.  Endocrine: Negative for polyuria.  Genitourinary: Negative for dysuria.  Musculoskeletal: Positive for neck pain and neck stiffness. Negative for back pain and joint swelling.  Allergic/Immunologic: Negative for immunocompromised state.  Neurological: Negative for dizziness and numbness.  Hematological: Does not bruise/bleed easily.  Psychiatric/Behavioral: The patient is not nervous/anxious.      Objective: Vital Signs: LMP 03/02/1984   Physical Exam  Ortho Exam awake alert and oriented 3. Comfortable sitting. No obvious effusion left knee. Large knees. No pain with straight leg raise. Little loss of internal and external rotation left hip but without pain. No swelling distally. Neurovascular exam intact. Left knee without instability. Some patellar crepitation. Full extension and about 100 of flexion. No popliteal pain.  Specialty Comments:  No specialty comments available.  Imaging: No results found.   PMFS History: Patient Active Problem List   Diagnosis Date Noted  . Varicose veins of bilateral lower extremities with other complications 19/14/7829  . DDD (degenerative disc disease), lumbar 10/19/2016  . 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 Fibromyalgia 05/17/2016  . Other psoriasis 02/18/2016  . High risk medication use 02/18/2016  . DJD (degenerative joint disease), cervical 02/18/2016  .  Heel spur 09/29/2015  . Ingrown toenail 09/29/2015  . Iron deficiency anemia due to chronic blood loss 08/14/2015  . Foraminal stenosis of cervical region 07/30/2015  . Microcytic hypochromic anemia 05/22/2015  . Tailor's bunion, left 01/18/2015  . H/O hiatal hernia   . Irritable bowel syndrome (IBS) 03/01/2014  . Bilateral primary osteoarthritis of knee 12/15/2013  . Iron deficiency 12/15/2013  .  Psoriatic arthritis ([place]) 12/15/2013  . History of foot fracture 12/15/2013  . Dermatitis 10/17/2013  . 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 Arthritis 05/31/2013  . 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 Skin lesion 12/09/2012  . Tinnitus 06/23/2012  . Scalp lesion 01/21/2012  . Headache(784.0) 01/21/2012  . Hx of psoriasis 01/21/2012  . Weight gain 01/21/2012  . Ankylosing spondylitis  possible 09/03/2011  . Hx of transfusion 09/03/2011  . S/P lumbar fusion 6 13 09/03/2011  . Lumbar stenosis with neurogenic claudication 08/05/2011  . LBP (low back pain) 01/17/2011  . ALLERGIC RHINITIS 02/19/2010  . GERD 02/19/2010  . ARTHRITIS 02/19/2010  . SPINAL STENOSIS, CERVICAL 02/19/2010  . OSTEOPENIA 02/19/2010  . WEIGHT GAIN 12/18/2008  . RASH AND OTHER NONSPECIFIC SKIN ERUPTION 12/07/2007  . IBS 04/28/2007  . PALPITATIONS 02/09/2007  . History of gastric ulcer 02/09/2007  . COLONIC POLYPS, HX OF 02/09/2007  . REDUCTION MAMMOPLASTY, HX OF 02/09/2007  . CHOLECYSTECTOMY, HX OF 02/09/2007  . Hyperlipidemia 12/29/2006  . Essential hypertension 12/29/2006   Past Medical History:  Diagnosis Date  . Abnormal Pap smear    H/O  . Anemia    iron def  . Arthritis    rheumatoid and osteo now flt to be psoriateic   . Arthritis    psoreratic, osteoarthritis  . Broken toe   . Bruises easily   . CHOLECYSTECTOMY, HX OF   . Chronic back pain   . COLONIC POLYPS, HX OF 02/09/2007  . Cough, persistent 01/21/2012   poss from acei   . 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 Fibromyalgia    doesn't require meds  . GASTRIC ULCER, ACUTE, HEMORRHAGE, HX OF 02/09/2007  . GERD (gastroesophageal reflux disease)    takes Nexium daily  . Goiter   . H/O hiatal hernia   . Headache(784.0)    occasionally  . History of bronchitis   . Hx of seasonal allergies    takes Claritin daily  . HYPERLIPIDEMIA 12/29/2006   takes Lovastatin nightly  . HYPERTENSION 12/29/2006    takes Enalapril daily  . IBS 04/28/2007  . Insomnia    related to pain;takes Flexeril and Tylenol PM nightly  . Joint pain   . Joint swelling   . MENOPAUSAL DISORDER 12/07/2007  . Palpitations 02/09/2007  . PONV (postoperative nausea and vomiting)    NAUSEA  . REDUCTION MAMMOPLASTY, HX OF 02/09/2007  . Rheumatoid arthritis(714.0) 12/29/2006  . Right bundle branch block 02/09/2007  . S/P lumbar fusion 6 13 09/03/2011   L4 L5  posterior   . SYNCOPE 12/07/2007  . UNS ADVRS EFF OTH RX MEDICINAL\T\BIOLOGICAL SBSTNC 02/09/2007  . UTI (lower urinary tract infection)    hx of;saw urologist last yr and he told her she was over reacting and not a big deal   . WEIGHT GAIN 12/18/2008    Family History  Problem Relation Age of Onset  . Diabetes Mother   . Colon cancer Mother        x2  . Arthritis Mother   . Hypertension Mother   .  Endometrial cancer Mother   . Stroke Father   . Heart disease Father   . Colon cancer Father   . Arthritis Brother   . Hypertension Brother   . Hyperlipidemia Brother   . Other Unknown        DISH brother  . Hypertension Maternal Grandmother   . Stroke Maternal Grandmother   . Infertility Brother   . Anesthesia problems Neg Hx   . Hypotension Neg Hx   . Malignant hyperthermia Neg Hx   . Pseudochol deficiency Neg Hx     Past Surgical History:  Procedure Laterality Date  . ABDOMINAL HYSTERECTOMY  1986   TAH/BSO  . BACK SURGERY  12+yrs ago   Synovial Cyst removal  . BACK SURGERY  6/13   lumbar L4-L5 fusion, lamenectomy  . blood transfused  6/13   transfused 2 units post op from back surgery  . bone spur  6-55yrs ago   right ankle  . bone spur Bilateral   . BREAST REDUCTION SURGERY     last 80's  . BUNIONECTOMY  10/03/14   right foot  . CERVICAL FUSION     2 12   . CHOLECYSTECTOMY  1996  . COLONOSCOPY    . ESOPHAGOGASTRODUODENOSCOPY ENDOSCOPY    . POSTERIOR CERVICAL LAMINECTOMY Left 07/30/2015   Procedure: Laminectomy and Foraminotomy - left -  Cervical two -Cervical three;  Surgeon: Earnie Larsson, MD;  Location: [facility];  Service: Neurosurgery;  Laterality: Left;  . REDUCTION MAMMAPLASTY Bilateral 1989  . TONSILLECTOMY     as a child   Social History   Occupational History  . Not on file  Tobacco Use  . Smoking status: Never Smoker  . Smokeless tobacco: Never Used  Substance and Sexual Activity  . Alcohol use: No    Alcohol/week: 0.0 oz  . Drug use: No  . Sexual activity: No    Partners: Male    Birth control/protection: Surgical    Comment: TAH/BSO

## 2017-01-26 ENCOUNTER — Other Ambulatory Visit: Payer: Self-pay | Admitting: Internal Medicine

## 2017-02-03 NOTE — Progress Notes (Signed)
Office Visit Note  Patient: Jade Cardenas             Date of Birth: Aug 15, 1950           MRN: [id number]             PCP: [name], MD Referring: [name], MD Visit Date: 02/16/2017 Occupation: @GUAROCC @    Subjective:  Pain in knee joints.   History of Present Illness: Jade Cardenas is a 66 y.o. female with history of psoriatic arthritis, psoriasis and osteoarthritis. She states she continues to have pain and discomfort in her bilateral knee joints. She had cortisone injection by Dr. Durward Fortes recently which did not relieve her knee pain. She is thinking of getting left total knee replacement in future. She continues to have stiffness in her cervical and lower lumbar region. She denies any joint swelling. She tried to reduce her methotrexate dose due to hair loss but her psoriasis flared and she resumed her methotrexate back to 0.5 mg every every week. She continues to have some discomfort from fibromyalgia but stopped both.  Activities of Daily Living:  Patient reports morning stiffness for 2 hours   reports nocturnal pain.  Difficulty dressing/grooming: reports Difficulty climbing stairs: reports Difficulty getting out of chair: reports Difficulty using hands for taps, buttons, cutlery, and/or writing: reports   Review of Systems  Constitutional: Positive for fatigue. Negative for weakness.  HENT: Positive for mouth dryness. Negative for nose dryness.   Eyes: Positive for dryness.  Respiratory: Negative.  Negative for cough, shortness of breath and difficulty breathing.   Cardiovascular: Negative.  Positive for hypertension. Negative for palpitations and irregular heartbeat.  Gastrointestinal: Negative for blood in stool, constipation and diarrhea.  Endocrine: Negative.   Musculoskeletal: Positive for arthralgias, joint pain, joint swelling, muscle weakness and morning stiffness. Negative for myalgias and myalgias.  Skin: Positive for hair loss. Negative  for color change, rash, ulcers and sensitivity to sunlight.  Neurological: Negative for headaches.  Hematological: Negative.  Negative for anemia and swollen glands.  Psychiatric/Behavioral: Positive for sleep disturbance. Negative for depressed mood. The patient is not nervous/anxious.     PMFS History:  Patient Active Problem List   Diagnosis Date Noted  . Varicose veins of bilateral lower extremities with other complications 09/73/5329  . DDD (degenerative disc disease), lumbar 10/19/2016  . 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 Fibromyalgia 05/17/2016  . Other psoriasis 02/18/2016  . High risk medication use 02/18/2016  . DJD (degenerative joint disease), cervical 02/18/2016  . Heel spur 09/29/2015  . Ingrown toenail 09/29/2015  . Iron deficiency anemia due to chronic blood loss 08/14/2015  . Foraminal stenosis of cervical region 07/30/2015  . Microcytic hypochromic anemia 05/22/2015  . Tailor's bunion, left 01/18/2015  . H/O hiatal hernia   . Irritable bowel syndrome (IBS) 03/01/2014  . Bilateral primary osteoarthritis of knee 12/15/2013  . Iron deficiency 12/15/2013  . Psoriatic arthritis ([place]) 12/15/2013  . History of foot fracture 12/15/2013  . Dermatitis 10/17/2013  . 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 Arthritis 05/31/2013  . 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 Skin lesion 12/09/2012  . Tinnitus 06/23/2012  . Scalp lesion 01/21/2012  . Headache(784.0) 01/21/2012  . Hx of psoriasis 01/21/2012  . Weight gain 01/21/2012  . Ankylosing spondylitis  possible 09/03/2011  . Hx of transfusion 09/03/2011  . S/P lumbar fusion 6 13 09/03/2011  . Lumbar stenosis with neurogenic claudication 08/05/2011  . LBP (  low back pain) 01/17/2011  . ALLERGIC RHINITIS 02/19/2010  . GERD 02/19/2010  . ARTHRITIS 02/19/2010  . SPINAL STENOSIS, CERVICAL 02/19/2010  . OSTEOPENIA 02/19/2010  . WEIGHT GAIN 12/18/2008  . RASH AND OTHER NONSPECIFIC SKIN ERUPTION  12/07/2007  . IBS 04/28/2007  . PALPITATIONS 02/09/2007  . History of gastric ulcer 02/09/2007  . COLONIC POLYPS, HX OF 02/09/2007  . REDUCTION MAMMOPLASTY, HX OF 02/09/2007  . CHOLECYSTECTOMY, HX OF 02/09/2007  . Hyperlipidemia 12/29/2006  . Essential hypertension 12/29/2006    Past Medical History:  Diagnosis Date  . Abnormal Pap smear    H/O  . Anemia    iron def  . Arthritis    rheumatoid and osteo now flt to be psoriateic   . Arthritis    psoreratic, osteoarthritis  . Broken toe   . Bruises easily   . CHOLECYSTECTOMY, HX OF   . Chronic back pain   . COLONIC POLYPS, HX OF 02/09/2007  . Cough, persistent 01/21/2012   poss from acei   . 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 Fibromyalgia    doesn't require meds  . GASTRIC ULCER, ACUTE, HEMORRHAGE, HX OF 02/09/2007  . GERD (gastroesophageal reflux disease)    takes Nexium daily  . Goiter   . H/O hiatal hernia   . Headache(784.0)    occasionally  . History of bronchitis   . Hx of seasonal allergies    takes Claritin daily  . HYPERLIPIDEMIA 12/29/2006   takes Lovastatin nightly  . HYPERTENSION 12/29/2006   takes Enalapril daily  . IBS 04/28/2007  . Insomnia    related to pain;takes Flexeril and Tylenol PM nightly  . Joint pain   . Joint swelling   . MENOPAUSAL DISORDER 12/07/2007  . Palpitations 02/09/2007  . PONV (postoperative nausea and vomiting)    NAUSEA  . REDUCTION MAMMOPLASTY, HX OF 02/09/2007  . Rheumatoid arthritis(714.0) 12/29/2006  . Right bundle branch block 02/09/2007  . S/P lumbar fusion 6 13 09/03/2011   L4 L5  posterior   . SYNCOPE 12/07/2007  . UNS ADVRS EFF OTH RX MEDICINAL\T\BIOLOGICAL SBSTNC 02/09/2007  . UTI (lower urinary tract infection)    hx of;saw urologist last yr and he told her she was over reacting and not a big deal   . WEIGHT GAIN 12/18/2008    Family History  Problem Relation Age of Onset  . Diabetes Mother   . Colon cancer Mother        x2  . Arthritis Mother   .  Hypertension Mother   . Endometrial cancer Mother   . Stroke Father   . Heart disease Father   . Colon cancer Father   . Arthritis Brother   . Hypertension Brother   . Hyperlipidemia Brother   . Other Unknown        DISH brother  . Hypertension Maternal Grandmother   . Stroke Maternal Grandmother   . Infertility Brother   . Anesthesia problems Neg Hx   . Hypotension Neg Hx   . Malignant hyperthermia Neg Hx   . Pseudochol deficiency Neg Hx    Past Surgical History:  Procedure Laterality Date  . ABDOMINAL HYSTERECTOMY  1986   TAH/BSO  . BACK SURGERY  12+yrs ago   Synovial Cyst removal  . BACK SURGERY  6/13   lumbar L4-L5 fusion, lamenectomy  . blood transfused  6/13   transfused 2 units post op from back surgery  . bone spur  6-78yrs ago   right ankle  .  bone spur Bilateral   . BREAST REDUCTION SURGERY     last 80's  . BUNIONECTOMY  10/03/14   right foot  . CERVICAL FUSION     2 12   . CHOLECYSTECTOMY  1996  . COLONOSCOPY    . ESOPHAGOGASTRODUODENOSCOPY ENDOSCOPY    . POSTERIOR CERVICAL LAMINECTOMY Left 07/30/2015   Procedure: Laminectomy and Foraminotomy - left - Cervical two -Cervical three;  Surgeon: Earnie Larsson, MD;  Location: [facility];  Service: Neurosurgery;  Laterality: Left;  . REDUCTION MAMMAPLASTY Bilateral 1989  . 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 She appears well-developed and well-nourished.  HENT:  Head: Normocephalic and atraumatic.  Eyes: Conjunctivae and EOM are normal.  Neck: Normal range of motion.  Cardiovascular: Normal rate, regular  rhythm, normal heart sounds and intact distal pulses.  Pulmonary/Chest: Effort normal and breath sounds normal.  Abdominal: Soft. Bowel sounds are normal.  Lymphadenopathy:    She has no cervical adenopathy.  Neurological: She is alert and oriented to person, place, and time.  Skin: Skin is warm and dry. Capillary refill takes less than 2 seconds.  Psychiatric: She has a normal mood and affect. Her behavior is normal.  Nursing note and vitals reviewed.    Musculoskeletal Exam: C-spine and lumbar spine limited range of motion. Shoulder joints elbow joints wrist joints are good range of motion. She is good range of motion of her MCPs PIPs DIPs with DIP thickening with no synovitis. Hip joints are good range of motion. She some warmth in her left knee joint. No other joints were swollen and had synovitis. She has some generalized pain from fibromyalgia.  CDAI Exam: No CDAI exam completed.    Investigation: No additional findings.Tb skin test: 12/03/2016 Negative CBC Latest Ref Rng & Units 11/30/2016 10/28/2016 08/05/2016  WBC 4.0 - 10.5 K/uL 7.7 5.8 5.4  Hemoglobin 12.0 - 15.0 g/dL 14.4 13.2 14.5  Hematocrit 36.0 - 46.0 % 41.6 37.5 41.7  Platelets 150.0 - 400.0 K/uL 381.0 276 308   CMP Latest Ref Rng & Units 10/28/2016 08/05/2016 05/07/2016  Glucose 70 - 140 mg/dl 95 90 135  BUN 7.0 - 26.0 mg/dL 17.9 16 20.4  Creatinine 0.6 - 1.1 mg/dL 0.8 0.72 0.8  Sodium 136 - 145 mEq/L 140 142 141  Potassium 3.5 - 5.1 mEq/L 4.3 4.3 4.1  Chloride 98 - 110 mmol/L - 104 -  CO2 22 - 29 mEq/L 29 26 26   Calcium 8.4 - 10.4 mg/dL 9.9 9.8 9.8  Total Protein 6.4 - 8.3 g/dL 7.2 7.7 7.9  Total Bilirubin 0.20 - 1.20 mg/dL 0.69 0.8 0.63  Alkaline Phos 40 - 150 U/L 78 87 107  AST 5 - 34 U/L 19 20 18   ALT 0 - 55 U/L 23 26 21     Imaging: Xr Pelvis 1-2 Views  Result Date: 01/25/2017 Film of the pelvis with by mouth intact right and left hip joint. No obvious bony pathology about  the left hemipelvis. Some  irregularity about the greater trochanter but asymptomatic in that area. Prior back surgery with evidence of fusion at L4-5   Speciality Comments: No specialty comments available.    Procedures:  No procedures performed Allergies: Erythromycin; Iodinated diagnostic agents; Ace inhibitors; Dilaudid [hydromorphone hcl]; Morphine and related; Keflex [cephalexin]; and Macrobid [nitrofurantoin]   Assessment / Plan:     Visit Diagnoses: Psoriatic arthritis ([place]): She has no active synovitis on examination she is doing really well on combination of Simponi subcutaneous and methotrexate. She tried to come off methotrexate but flared her psoriasis and she resumed her methotrexate. She's concerned as she is switching to Medicare about her future coverage of Simponi subcutaneous. She's exploring different options at this point.  High risk medication use - Simponi SubQ monthly;  MTX 0.5 mL subcutaneous every week, Folic acid 1 mg by mouth daily// Jolee Ewing and Otezla caused diarrhea) - Plan: CBC with Differential/Platelet, COMPLETE METABOLIC PANEL WITH GFR, CBC with Differential/Platelet, COMPLETE METABOLIC PANEL WITH GFR we'll check labs today and then every 3 months to monitor for drug toxicity.  Hx of psoriasis: She has no active lesions currently.  Primary osteoarthritis of both knees - s/p Euflexxa 09/2016. She continues to have pain and discomfort in her left knee joint. She seen Dr. Durward Fortes and had cortisone injection. She is planning to have left total knee replacement in future.  DDD (degenerative disc disease), lumbar - S/P fusion: Chronic pain  DDD (degenerative disc disease), cervical - S/P fusion: Chronic pain  Fibromyalgia: She continues to have generalized pain and discomfort.  Iron deficiency - 05/20/2016: Patient is getting anemia/iron deficiency addressed by Dr. Irene Limbo, hematologist - Plan: Iron, TIBC and Ferritin Panel  Chronic fatigue: She continues to have some fatigue.  Other  medical problems are listed as follows:  History of hyperlipidemia  History of IBS  History of gastric ulcer - f/u by Dr. [name]   H/O hiatal hernia    Orders: Orders Placed This Encounter  Procedures  . CBC with Differential/Platelet  . COMPLETE METABOLIC PANEL WITH GFR  . Iron, TIBC and Ferritin Panel  . CBC with Differential/Platelet  . COMPLETE METABOLIC PANEL WITH GFR   Meds ordered this encounter  Medications  . methotrexate 50 MG/2ML injection    Sig: Inject 0.5 mLs (12.5 mg total) into the skin once for 1 dose.    Dispense:  6 mL    Refill:  0  . Tuberculin-Allergy Syringes (B-D ALLERGY SYRINGE 1CC/28G) 28G X 1/2" 1 ML MISC    Sig: USE AS DIRECTED ONCE WEEKLY    Dispense:  12 each    Refill:  3    Face-to-face time spent with patient was 30 minutes. Greater than 50% of time was spent in counseling and coordination of care.  Follow-Up Instructions: Return in about 5 months (around 07/17/2017) for Psoriatic arthritis, Osteoarthritis.   Bo Merino, MD  Note - This record has been created using Editor, commissioning.  Chart creation errors have been sought, but may not always  have been located. Such creation errors do not reflect on  the standard of medical care.

## 2017-02-04 ENCOUNTER — Ambulatory Visit (INDEPENDENT_AMBULATORY_CARE_PROVIDER_SITE_OTHER): Payer: BLUE CROSS/BLUE SHIELD | Admitting: *Deleted

## 2017-02-04 DIAGNOSIS — Z23 Encounter for immunization: Secondary | ICD-10-CM | POA: Diagnosis not present

## 2017-02-16 ENCOUNTER — Telehealth: Payer: Self-pay

## 2017-02-16 ENCOUNTER — Ambulatory Visit (INDEPENDENT_AMBULATORY_CARE_PROVIDER_SITE_OTHER): Payer: BLUE CROSS/BLUE SHIELD | Admitting: Rheumatology

## 2017-02-16 ENCOUNTER — Encounter: Payer: Self-pay | Admitting: Rheumatology

## 2017-02-16 VITALS — BP 149/81 | HR 84 | Resp 14 | Ht 64.0 in | Wt 220.0 lb

## 2017-02-16 DIAGNOSIS — M797 Fibromyalgia: Secondary | ICD-10-CM

## 2017-02-16 DIAGNOSIS — M5136 Other intervertebral disc degeneration, lumbar region: Secondary | ICD-10-CM

## 2017-02-16 DIAGNOSIS — M170 Bilateral primary osteoarthritis of knee: Secondary | ICD-10-CM

## 2017-02-16 DIAGNOSIS — L4050 Arthropathic psoriasis, unspecified: Secondary | ICD-10-CM

## 2017-02-16 DIAGNOSIS — Z79899 Other long term (current) drug therapy: Secondary | ICD-10-CM

## 2017-02-16 DIAGNOSIS — M5030 Other cervical disc degeneration, unspecified cervical region: Secondary | ICD-10-CM

## 2017-02-16 DIAGNOSIS — Z8639 Personal history of other endocrine, nutritional and metabolic disease: Secondary | ICD-10-CM

## 2017-02-16 DIAGNOSIS — Z8719 Personal history of other diseases of the digestive system: Secondary | ICD-10-CM | POA: Diagnosis not present

## 2017-02-16 DIAGNOSIS — E611 Iron deficiency: Secondary | ICD-10-CM | POA: Diagnosis not present

## 2017-02-16 DIAGNOSIS — Z872 Personal history of diseases of the skin and subcutaneous tissue: Secondary | ICD-10-CM | POA: Diagnosis not present

## 2017-02-16 DIAGNOSIS — R5382 Chronic fatigue, unspecified: Secondary | ICD-10-CM

## 2017-02-16 DIAGNOSIS — Z8711 Personal history of peptic ulcer disease: Secondary | ICD-10-CM

## 2017-02-16 MED ORDER — "TUBERCULIN-ALLERGY SYRINGES 28G X 1/2"" 1 ML MISC": 3 refills | Status: DC

## 2017-02-16 MED ORDER — METHOTREXATE SODIUM CHEMO INJECTION 50 MG/2ML: 12.5000 mg | Freq: Once | INTRAMUSCULAR | 0 refills | Status: AC

## 2017-02-16 MED ORDER — METHOTREXATE SODIUM CHEMO INJECTION 50 MG/2ML: 12.5000 mg | Freq: Once | INTRAMUSCULAR | 0 refills | Status: DC

## 2017-02-16 NOTE — Telephone Encounter (Signed)
Spoke to patient at her visit today about insurance changes. Patient currently uses Simponi and gets it for $5 with her insurance and copay card. She will be enrolled in [place] starting March 1st. She is concerned about her copay for Simponi SQ with Medicare. She states that she will have a $350 deductible. I gave her information on the Simponi Patient Assistance program and explained that she would be required to pay 4% of her total gross income and show EOB for her prescriptions.We will look into applying when her new insurance starts.  Patient voices understanding and denies any questions at this time.   Peri Kreft, [place], CPhT 11:24 AM

## 2017-02-16 NOTE — Telephone Encounter (Signed)
Patient called stating that Rx for MTX was sent to the incorrect pharmacy.  The correct pharmacy is [facility] on [address]  CB# [phone].  Please advise.

## 2017-02-16 NOTE — Telephone Encounter (Signed)
Prescription resent to the correct pharmacy.  

## 2017-02-16 NOTE — Addendum Note (Signed)
Addended by: Carole Binning on: 02/16/2017 04:58 PM   Modules accepted: Orders

## 2017-02-16 NOTE — Patient Instructions (Signed)
Standing Labs We placed an order today for your standing lab work.    Please come back and get your standing labs in March and every 3 months  We have open lab Monday through Friday from 8:30-11:30 AM and 1:30-4 PM at the office of Dr. Baily Serpe.   The office is located at [address], [address], [place], [place] [address] No appointment is necessary.   Labs are drawn by [facility].  You may receive a bill from [facility] for your lab work. If you have any questions regarding directions or hours of operation,  please call [phone].    

## 2017-02-17 ENCOUNTER — Telehealth: Payer: Self-pay

## 2017-02-17 LAB — COMPLETE METABOLIC PANEL WITH GFR
AG Ratio: 1.6 (calc) (ref 1.0–2.5)
ALT: 24 U/L (ref 6–29)
AST: 21 U/L (ref 10–35)
Albumin: 4.7 g/dL (ref 3.6–5.1)
Alkaline phosphatase (APISO): 93 U/L (ref 33–130)
BUN: 17 mg/dL (ref 7–25)
CO2: 29 mmol/L (ref 20–32)
Calcium: 10.5 mg/dL — ABNORMAL HIGH (ref 8.6–10.4)
Chloride: 104 mmol/L (ref 98–110)
Creat: 0.85 mg/dL (ref 0.50–0.99)
GFR, Est African American: 84 mL/min/{1.73_m2} (ref >=60)
GFR, Est Non African American: 72 mL/min/{1.73_m2} (ref >=60)
Globulin: 3 g/dL (calc) (ref 1.9–3.7)
Glucose, Bld: 107 mg/dL — ABNORMAL HIGH (ref 65–99)
Potassium: 5.4 mmol/L — ABNORMAL HIGH (ref 3.5–5.3)
Sodium: 146 mmol/L (ref 135–146)
Total Bilirubin: 0.8 mg/dL (ref 0.2–1.2)
Total Protein: 7.7 g/dL (ref 6.1–8.1)

## 2017-02-17 LAB — CBC WITH DIFFERENTIAL/PLATELET
Basophils Absolute: 50 cells/uL (ref 0–200)
Basophils Relative: 0.7 %
Eosinophils Absolute: 58 cells/uL (ref 15–500)
Eosinophils Relative: 0.8 %
HCT: 42.2 % (ref 35.0–45.0)
Hemoglobin: 14.6 g/dL (ref 11.7–15.5)
Lymphs Abs: 2052 cells/uL (ref 850–3900)
MCH: 33.3 pg — ABNORMAL HIGH (ref 27.0–33.0)
MCHC: 34.6 g/dL (ref 32.0–36.0)
MCV: 96.3 fL (ref 80.0–100.0)
MPV: 9.9 fL (ref 7.5–12.5)
Monocytes Relative: 6.8 %
Neutro Abs: 4550 cells/uL (ref 1500–7800)
Neutrophils Relative %: 63.2 %
Platelets: 370 10*3/uL (ref 140–400)
RBC: 4.38 10*6/uL (ref 3.80–5.10)
RDW: 12 % (ref 11.0–15.0)
Total Lymphocyte: 28.5 %
WBC mixed population: 490 cells/uL (ref 200–950)
WBC: 7.2 10*3/uL (ref 3.8–10.8)

## 2017-02-17 LAB — IRON,TIBC AND FERRITIN PANEL
%SAT: 35 % (calc) (ref 11–50)
Ferritin: 723 ng/mL — ABNORMAL HIGH (ref 20–288)
Iron: 116 ug/dL (ref 45–160)
TIBC: 327 mcg/dL (calc) (ref 250–450)

## 2017-02-17 MED ORDER — METHOTREXATE SODIUM CHEMO INJECTION 50 MG/2ML: 12.5000 mg | INTRAMUSCULAR | 0 refills | Status: DC

## 2017-02-17 NOTE — Telephone Encounter (Signed)
Prescription received by the pharmacy was for MTX 1 dose. Patient advised prescription was recent to pharmacy for a 3 month supply.

## 2017-02-17 NOTE — Addendum Note (Signed)
Addended by: Carole Binning on: 02/17/2017 11:07 AM   Modules accepted: Orders

## 2017-02-17 NOTE — Telephone Encounter (Signed)
Patient called concerning clarification on directions for MTX Rx.  [facility]armacy is [facility] [address].  Cb# is [phone].  Please advise.  Thank you

## 2017-02-17 NOTE — Progress Notes (Signed)
Notified patient that potassium is high. If she is taking potassium supplement she should contact her PCP. Please fax results to her PCP.

## 2017-03-15 ENCOUNTER — Ambulatory Visit (INDEPENDENT_AMBULATORY_CARE_PROVIDER_SITE_OTHER): Payer: BLUE CROSS/BLUE SHIELD | Admitting: Obstetrics & Gynecology

## 2017-03-15 ENCOUNTER — Other Ambulatory Visit (HOSPITAL_COMMUNITY)
Admission: RE | Admit: 2017-03-15 | Discharge: 2017-03-15 | Disposition: A | Discharge status: Home / Self Care | Payer: BLUE CROSS/BLUE SHIELD | Source: Ambulatory Visit | Attending: Obstetrics & Gynecology | Admitting: Obstetrics & Gynecology

## 2017-03-15 ENCOUNTER — Encounter: Payer: Self-pay | Admitting: Obstetrics & Gynecology

## 2017-03-15 VITALS — BP 138/78 | HR 86 | Resp 18 | Ht 63.75 in | Wt 215.0 lb

## 2017-03-15 DIAGNOSIS — Z9189 Other specified personal risk factors, not elsewhere classified: Secondary | ICD-10-CM

## 2017-03-15 DIAGNOSIS — Z124 Encounter for screening for malignant neoplasm of cervix: Secondary | ICD-10-CM

## 2017-03-15 DIAGNOSIS — Z01419 Encounter for gynecological examination (general) (routine) without abnormal findings: Secondary | ICD-10-CM | POA: Diagnosis not present

## 2017-03-15 NOTE — Progress Notes (Signed)
67 y.o. G0P0000 WidowedCaucasianF here for annual exam.  Doing well except for joint issues.  Planning knee replacement on left.  Had knee injury with a fall.  Has psoriatic arthritis and is followed by Dr. Estanislado Pandy.  Seeing Dr. Elmyra Ricks next month.    Had iron transfusions in March.  Ferritin was elevated and has been followed.  Not anemia.   Had chronic diarrhea.  Was on treatment but medication is cost-prohibitive.  She is     Patient's last menstrual period was 03/02/1984.          Sexually active: No.  The current method of family planning is status post hysterectomy.  Exercising: Yes.    walking Smoker:  no  Health Maintenance: Pap:  03/13/16 Neg. HR HPV:neg   11/16/14 Neg   History of abnormal Pap:  Yes, DES exposure  MMG:  07/01/16 BIRADS1:neg  Colonoscopy:  06/13/15 f/u 5 years  BMD:   09/28/13 Osteopenia  TDaP:  2010 Pneumonia vaccine(s):  2015 Shingrix: completed 2018 Hep C testing: 11/16/14 Neg  Screening Labs: PCP   reports that  has never smoked. she has never used smokeless tobacco. She reports that she does not drink alcohol or use drugs.  Past Medical History:  Diagnosis Date  . Anemia    iron def  . Arthritis    rheumatoid and osteo now flt to be psoriateic   . Arthritis    psoreratic, osteoarthritis  . Broken toe   . Bruises easily   . CHOLECYSTECTOMY, HX OF   . Chronic back pain   . COLONIC POLYPS, HX OF 02/09/2007  . Cough, persistent 01/21/2012   poss from acei   . 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 Fibromyalgia    doesn't require meds  . GASTRIC ULCER, ACUTE, HEMORRHAGE, HX OF 02/09/2007  . GERD (gastroesophageal reflux disease)    takes Nexium daily  . Goiter   . H/O hiatal hernia   . Headache(784.0)    occasionally  . History of bronchitis   . Hx of seasonal allergies    takes Claritin daily  . HYPERLIPIDEMIA 12/29/2006   takes Lovastatin nightly  . HYPERTENSION 12/29/2006   takes Enalapril daily  . IBS 04/28/2007  . Insomnia    related to  pain;takes Flexeril and Tylenol PM nightly  . Joint pain   . Joint swelling   . Palpitations 02/09/2007  . PONV (postoperative nausea and vomiting)    NAUSEA  . REDUCTION MAMMOPLASTY, HX OF 02/09/2007  . Rheumatoid arthritis(714.0) 12/29/2006  . Right bundle branch block 02/09/2007  . S/P lumbar fusion 6 13 09/03/2011   L4 L5  posterior   . SYNCOPE 12/07/2007  . UNS ADVRS EFF OTH RX MEDICINAL\T\BIOLOGICAL SBSTNC 02/09/2007    Past Surgical History:  Procedure Laterality Date  . BACK SURGERY  12+yrs ago   Synovial Cyst removal  . BACK SURGERY  6/13   lumbar L4-L5 fusion, lamenectomy  . bone spur  6-61yrs ago   right ankle  . BUNIONECTOMY  10/03/14   right foot  . CERVICAL FUSION     2 12   . CHOLECYSTECTOMY  1996  . POSTERIOR CERVICAL LAMINECTOMY Left 07/30/2015   Procedure: Laminectomy and Foraminotomy - left - Cervical two -Cervical three;  Surgeon: Earnie Larsson, MD;  Location: [facility];  Service: Neurosurgery;  Laterality: Left;  . REDUCTION MAMMAPLASTY Bilateral 1989  . TONSILLECTOMY     as a child  . TOTAL ABDOMINAL HYSTERECTOMY W/ BILATERAL SALPINGOOPHORECTOMY  1986    Current Outpatient Medications  Medication Sig Dispense Refill  . acetaminophen (TYLENOL) 650 MG CR tablet Take 650 mg by mouth as needed for pain.    Marland Kitchen amLODipine (NORVASC) 5 MG tablet TAKE 1 TABLET (5 MG TOTAL) BY MOUTH DAILY. 90 tablet 1  . Cholecalciferol (VITAMIN D) 2000 units tablet Take 2,000 Units by mouth daily.    . clobetasol ointment (TEMOVATE) 0.05 % APPLY TWICE A DAY 45 g 1  . Diphenhydramine-APAP, sleep, (TYLENOL PM EXTRA STRENGTH PO) Take 2 tablets by mouth at bedtime as needed. For sleep    . esomeprazole (NEXIUM) 40 MG capsule Take 1 capsule (40 mg total) by mouth daily before breakfast. 30 capsule 11  . folic acid (FOLVITE) 1 MG tablet Take 1 tablet (1 mg total) by mouth daily. 90 tablet 4  . losartan (COZAAR) 100 MG tablet TAKE 1 TABLET (100 MG TOTAL) BY MOUTH DAILY. 90 tablet 1  .  lovastatin (MEVACOR) 20 MG tablet TAKE 1 TABLET (20 MG TOTAL) BY MOUTH AT BEDTIME. 90 tablet 1  . methotrexate 50 MG/2ML injection Inject 0.5 mLs (12.5 mg total) into the skin once a week. 6 mL 0  . Multiple Vitamin (MULTIVITAMIN) tablet Take 1 tablet by mouth daily.      . Probiotic Product (VSL#3 PO) Take 1 Units by mouth 2 (two) times daily.    Marland Kitchen SIMPONI 50 MG/0.5ML SOAJ INJECT 50MG  (1 PEN) UNDER THE SKIN ONCE MONTHLY 0.5 mL 2  . Tuberculin-Allergy Syringes (B-D ALLERGY SYRINGE 1CC/28G) 28G X 1/2" 1 ML MISC USE AS DIRECTED ONCE WEEKLY 12 each 3   No current facility-administered medications for this visit.     Family History  Problem Relation Age of Onset  . Diabetes Mother   . Colon cancer Mother        x2  . Arthritis Mother   . Hypertension Mother   . Endometrial cancer Mother   . Stroke Father   . Heart disease Father   . Colon cancer Father   . Arthritis Brother   . Hypertension Brother   . Hyperlipidemia Brother   . Other Unknown        DISH brother  . Hypertension Maternal Grandmother   . Stroke Maternal Grandmother   . Infertility Brother   . Anesthesia problems Neg Hx   . Hypotension Neg Hx   . Malignant hyperthermia Neg Hx   . Pseudochol deficiency Neg Hx     ROS:  Pertinent items are noted in HPI.  Otherwise, a comprehensive ROS was negative.  Exam:   BP 138/78 (BP Location: Left Arm, Patient Position: Sitting, Cuff Size: Large)   Pulse 86   Resp 18   Ht 5' 3.75" (1.619 m)   Wt 215 lb (97.5 kg)   LMP 03/02/1984   BMI 37.19 kg/m    Height: 5' 3.75" (161.9 cm)  Ht Readings from Last 3 Encounters:  03/15/17 5' 3.75" (1.619 m)  02/16/17 5\' 4"  (1.626 m)  01/04/17 5\' 5"  (1.651 m)    General appearance: alert, cooperative and appears stated age Head: Normocephalic, without obvious abnormality, atraumatic Neck: no adenopathy, supple, symmetrical, trachea midline and thyroid normal to inspection and palpation Lungs: clear to auscultation  bilaterally Breasts: normal appearance, no masses or tenderness Heart: regular rate and rhythm Abdomen: soft, non-tender; bowel sounds normal; no masses,  no organomegaly Extremities: extremities normal, atraumatic, no cyanosis or edema Skin: Skin color, texture, turgor normal. No rashes or lesions Lymph nodes: Cervical,  supraclavicular, and axillary nodes normal. No abnormal inguinal nodes palpated Neurologic: Grossly normal   Pelvic: External genitalia:  no lesions              Urethra:  normal appearing urethra with no masses, tenderness or lesions              Bartholins and Skenes: normal                 Vagina: normal appearing vagina with normal color and discharge, no lesions              Cervix: absent              Pap taken: Yes.   Bimanual Exam:  Uterus:  uterus absent              Adnexa: no mass, fullness, tenderness               Rectovaginal: Confirms               Anus:  normal sphincter tone, no lesions  Chaperone was present for exam.  A:  Well Woman with normal exam H/O TAH/BSO, off HRT H/O DES exposure Family hx of colon cancer Psoriatic arthritis.  Followed by Dr. Estanislado Pandy.    P:   Mammogram guidelines reviewed Colonoscopy is up to date pap smear obtained today.  Neg pap with neg HR HPV obtained last year. Lab work and vaccinations are UTD return annually or prn

## 2017-03-16 LAB — CYTOLOGY - PAP: Diagnosis: NEGATIVE

## 2017-03-23 ENCOUNTER — Telehealth: Payer: Self-pay

## 2017-03-23 NOTE — Telephone Encounter (Signed)
Patient dropped off her provider portion for Simponi SQ patient assistance. Pt's insurance will change on March 1st to [facility] Advantage. Patient would like to apply to assistance program. She is currently using her commercial insurance with a copay card to help with the cost. She spoke with a representative who told her to fax in her application along with her current and future insurance information. Pt will bring by her portion of the application to be faxed along with the provider portion.  Jade Cardenas, [place], CPhT 1:26 PM

## 2017-03-23 NOTE — Telephone Encounter (Signed)
Pt came by clinic to drop off application to [employer]. Application was completed and faxed to foundation. Will update once we receive a response.  Loni Delbridge, [place], CPhT 4:18 PM

## 2017-03-31 NOTE — Telephone Encounter (Signed)
Receive a fax from [place] [employer] [place] Patient Assistance stating that pt is missing provider signature date, patient phone number and tax ID.   Called [employer] to clarify. Spoke with Pieter Partridge who states provider signature on page 4 is signed with a 2018 date. It needs to be corrected and faxed in. All other information is updated. Date has been corrected and application was faxed back. Will update once we receive a response.   Alawna Graybeal, [place], CPhT 3:47 PM

## 2017-04-05 ENCOUNTER — Other Ambulatory Visit: Payer: Self-pay

## 2017-04-05 ENCOUNTER — Other Ambulatory Visit: Payer: Self-pay | Admitting: Rheumatology

## 2017-04-05 MED ORDER — METHOTREXATE SODIUM CHEMO INJECTION 50 MG/2ML: 12.5000 mg | INTRAMUSCULAR | 0 refills | Status: DC

## 2017-04-05 NOTE — Telephone Encounter (Signed)
Received a fax from [employer] stating that the pt has been approved to receive Simponi from the foundation free of charge until 04/05/2018. Patient will be required to submit a new application for re-assessment after 04/25/2018.  Will send document to scan center.   Called pt to update. Left message.  Koda Routon, [place], CPhT  3:20 PM

## 2017-04-05 NOTE — Telephone Encounter (Signed)
Last visit: 02/16/2017 Next visit: 07/28/2017 Labs: 02/16/2017 elevated potassium.  TB Skin test: 12/03/2016 Negative  Okay to refill per Dr. Estanislado Pandy.

## 2017-04-05 NOTE — Progress Notes (Signed)
Last visit: 02/16/2017 Next visit: 07/28/2017 Labs: 02/16/2017 elevated potassium   Okay to refill per Dr. Estanislado Pandy.  Refill request received via fax.

## 2017-04-06 NOTE — Telephone Encounter (Signed)
Patient returned call. Gave her the number to [facility] to see when her first shipment would go out. Her Medicare coverage starts March 1st. Patient voices understanding and denies any questions at this time.  Sheli Dorin, [place], CPhT 9:32 AM

## 2017-04-12 ENCOUNTER — Telehealth: Payer: Self-pay

## 2017-04-12 MED ORDER — GOLIMUMAB 50 MG/0.5ML ~~LOC~~ SOAJ: SUBCUTANEOUS | 2 refills | Status: DC

## 2017-04-12 NOTE — Telephone Encounter (Signed)
Patient returned the call and clarified pharmacy. Prescription has been sent to the pharmacy.

## 2017-04-12 NOTE — Telephone Encounter (Addendum)
Left message on machine for patient to call the office. Need to clarify pharmacy.

## 2017-04-12 NOTE — Telephone Encounter (Signed)
Patient called to say that she will be able to use [facility] to get her Simponi through [employer] and [employer]. She is requesting that a new rx be sent to [facility]. They have not received the Rx that was sent on 04/06/27. Thanks!  Diyari Cherne, [place], CPhT 2:08 PM

## 2017-04-27 ENCOUNTER — Other Ambulatory Visit: Payer: Self-pay | Admitting: *Deleted

## 2017-04-27 MED ORDER — FOLIC ACID 1 MG PO TABS: 1.0000 mg | ORAL_TABLET | Freq: Every day | ORAL | 4 refills | Status: DC

## 2017-04-27 NOTE — Telephone Encounter (Signed)
Refill request received via fax  Last visit: 02/16/2017 Next visit: 07/28/2017  Okay to refill per Dr. Estanislado Pandy

## 2017-05-05 ENCOUNTER — Other Ambulatory Visit: Payer: Self-pay | Admitting: *Deleted

## 2017-05-05 ENCOUNTER — Other Ambulatory Visit: Payer: Self-pay

## 2017-05-05 DIAGNOSIS — D500 Iron deficiency anemia secondary to blood loss (chronic): Secondary | ICD-10-CM

## 2017-05-05 DIAGNOSIS — Z79899 Other long term (current) drug therapy: Secondary | ICD-10-CM

## 2017-05-05 LAB — COMPLETE METABOLIC PANEL WITH GFR
AG Ratio: 1.6 (calc) (ref 1.0–2.5)
ALT: 22 U/L (ref 6–29)
AST: 19 U/L (ref 10–35)
Albumin: 4.6 g/dL (ref 3.6–5.1)
Alkaline phosphatase (APISO): 81 U/L (ref 33–130)
BUN: 13 mg/dL (ref 7–25)
CO2: 28 mmol/L (ref 20–32)
Calcium: 9.7 mg/dL (ref 8.6–10.4)
Chloride: 104 mmol/L (ref 98–110)
Creat: 0.75 mg/dL (ref 0.50–0.99)
GFR, Est African American: 98 mL/min/{1.73_m2} (ref >=60)
GFR, Est Non African American: 84 mL/min/{1.73_m2} (ref >=60)
Globulin: 2.8 g/dL (calc) (ref 1.9–3.7)
Glucose, Bld: 84 mg/dL (ref 65–99)
Potassium: 4.4 mmol/L (ref 3.5–5.3)
Sodium: 140 mmol/L (ref 135–146)
Total Bilirubin: 0.7 mg/dL (ref 0.2–1.2)
Total Protein: 7.4 g/dL (ref 6.1–8.1)

## 2017-05-05 LAB — CBC WITH DIFFERENTIAL/PLATELET
Basophils Absolute: 43 cells/uL (ref 0–200)
Basophils Relative: 0.7 %
Eosinophils Absolute: 67 cells/uL (ref 15–500)
Eosinophils Relative: 1.1 %
HCT: 40 % (ref 35.0–45.0)
Hemoglobin: 14.1 g/dL (ref 11.7–15.5)
Lymphs Abs: 1519 cells/uL (ref 850–3900)
MCH: 33.7 pg — ABNORMAL HIGH (ref 27.0–33.0)
MCHC: 35.3 g/dL (ref 32.0–36.0)
MCV: 95.7 fL (ref 80.0–100.0)
MPV: 9.7 fL (ref 7.5–12.5)
Monocytes Relative: 7.5 %
Neutro Abs: 4014 cells/uL (ref 1500–7800)
Neutrophils Relative %: 65.8 %
Platelets: 350 10*3/uL (ref 140–400)
RBC: 4.18 10*6/uL (ref 3.80–5.10)
RDW: 12.9 % (ref 11.0–15.0)
Total Lymphocyte: 24.9 %
WBC mixed population: 458 cells/uL (ref 200–950)
WBC: 6.1 10*3/uL (ref 3.8–10.8)

## 2017-05-06 ENCOUNTER — Telehealth: Payer: Self-pay | Admitting: Internal Medicine

## 2017-05-06 ENCOUNTER — Encounter: Payer: Self-pay | Admitting: Internal Medicine

## 2017-05-06 LAB — IRON,TIBC AND FERRITIN PANEL
%SAT: 35 % (calc) (ref 11–50)
Ferritin: 512 ng/mL — ABNORMAL HIGH (ref 20–288)
Iron: 104 ug/dL (ref 45–160)
TIBC: 296 mcg/dL (calc) (ref 250–450)

## 2017-05-06 NOTE — Telephone Encounter (Signed)
Surgical clearance paperwork received from [facility]  Pt is scheduled on 06/21/17 for knee surgery.  Form is in your red folder.

## 2017-05-06 NOTE — Telephone Encounter (Signed)
Patient sent MyChart message requesting form be completed prior to her March 29 appt w/ Dr. Maureen Ralphs.  Notified patient via MyChart that form was received and is pending completion and we will contact her if appt or other additional information is necessary.

## 2017-05-07 NOTE — Progress Notes (Signed)
WNL except the Ferritin is high.

## 2017-05-07 NOTE — Telephone Encounter (Signed)
Signed this   On your desk

## 2017-05-11 ENCOUNTER — Encounter (HOSPITAL_COMMUNITY): Payer: Self-pay | Admitting: *Deleted

## 2017-05-13 NOTE — Telephone Encounter (Signed)
Clearance faxed back . Nothing further needed.

## 2017-05-25 ENCOUNTER — Encounter: Payer: Self-pay | Admitting: Internal Medicine

## 2017-06-01 NOTE — Progress Notes (Signed)
Chief Complaint  Patient presents with  . Follow-up    no new concerns    HPI: Jade Cardenas 67 y.o. come in for Chronic disease management   Left knee  To get tkr  4 22   Per dr Maureen Ralphs.   Now on  Medicare .    Post injury .   Has had pre physical therapy .     Check  Laceration  Right  Pinky  finger.  superficial laceration  Glass   3 days ago Check  No bleeding using neosporin .   BP   130/68  On meds amlod and losartan  Would like to dec med in future but controlled in 130 range for now  Gi IBS issue  Lotrinex.   Not helping that much .   Cramping some.   Loose this week no fever blood poss mex food will  Be  Off med for her ibs pre op and cost so off   Dog care.   No longer anemic after  Infusions and  No bleeding   psoriatic arthritis   Stable    ROS: See pertinent positives and negatives per HPI. No faling syncope bleeding    Past Medical History:  Diagnosis Date  . Anemia    iron def  . Arthritis    rheumatoid and osteo now flt to be psoriateic   . Arthritis    psoreratic, osteoarthritis  . Broken toe   . Bruises easily   . CHOLECYSTECTOMY, HX OF   . Chronic back pain   . COLONIC POLYPS, HX OF 02/09/2007  . Cough, persistent 01/21/2012   poss from acei   . 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 Fibromyalgia    doesn't require meds  . GASTRIC ULCER, ACUTE, HEMORRHAGE, HX OF 02/09/2007  . GERD (gastroesophageal reflux disease)    takes Nexium daily  . Goiter   . H/O hiatal hernia   . Headache(784.0)    occasionally  . History of bronchitis   . Hx of seasonal allergies    takes Claritin daily  . HYPERLIPIDEMIA 12/29/2006   takes Lovastatin nightly  . HYPERTENSION 12/29/2006   takes Enalapril daily  . IBS 04/28/2007  . Insomnia    related to pain;takes Flexeril and Tylenol PM nightly  . Joint pain   . Joint swelling   . Palpitations 02/09/2007  . PONV (postoperative nausea and vomiting)    NAUSEA  . REDUCTION MAMMOPLASTY, HX OF 02/09/2007  .  Rheumatoid arthritis(714.0) 12/29/2006  . Right bundle branch block 02/09/2007  . S/P lumbar fusion 6 13 09/03/2011   L4 L5  posterior   . SYNCOPE 12/07/2007  . UNS ADVRS EFF OTH RX MEDICINAL\T\BIOLOGICAL SBSTNC 02/09/2007    Family History  Problem Relation Age of Onset  . Diabetes Mother   . Colon cancer Mother        x2  . Arthritis Mother   . Hypertension Mother   . Endometrial cancer Mother   . Stroke Father   . Heart disease Father   . Colon cancer Father   . Arthritis Brother   . Hypertension Brother   . Hyperlipidemia Brother   . Other Unknown        DISH brother  . Hypertension Maternal Grandmother   . Stroke Maternal Grandmother   . Infertility Brother   . Anesthesia problems Neg Hx   . Hypotension Neg Hx   . Malignant hyperthermia Neg Hx   . Pseudochol deficiency  Neg Hx     Social History   Socioeconomic History  . Marital status: Widowed    Spouse name: Not on file  . Number of children: Not on file  . Years of education: Not on file  . Highest education level: Not on file  Occupational History  . Not on file  Social Needs  . Financial resource strain: Not on file  . Food insecurity:    Worry: Not on file    Inability: Not on file  . Transportation needs:    Medical: Not on file    Non-medical: Not on file  Tobacco Use  . Smoking status: Never Smoker  . Smokeless tobacco: Never Used  Substance and Sexual Activity  . Alcohol use: No    Alcohol/week: 0.0 oz  . Drug use: No  . Sexual activity: Never    Partners: Male    Birth control/protection: Surgical    Comment: TAH/BSO  Lifestyle  . Physical activity:    Days per week: Not on file    Minutes per session: Not on file  . Stress: Not on file  Relationships  . Social connections:    Talks on phone: Not on file    Gets together: Not on file    Attends religious service: Not on file    Active member of club or organization: Not on file    Attends meetings of clubs or organizations: Not on  file    Relationship status: Not on file  Other Topics Concern  . 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 acetaminophen (TYLENOL) 650 MG CR tablet Take 650 mg by mouth as needed for pain.    Marland Kitchen amLODipine (NORVASC) 5 MG tablet TAKE 1 TABLET (5 MG TOTAL) BY MOUTH DAILY. 90 tablet 1  . Cholecalciferol (VITAMIN D) 2000 units tablet Take 2,000 Units by mouth daily.    . clobetasol ointment (TEMOVATE) 0.05 % APPLY TWICE A DAY 45 g 1  . Diphenhydramine-APAP, sleep, (TYLENOL PM EXTRA STRENGTH PO) Take 2 tablets by mouth at bedtime as needed. For sleep    . esomeprazole (NEXIUM) 40 MG capsule Take 1 capsule (40 mg total) by mouth daily before breakfast. 30 capsule 11  . folic acid (FOLVITE) 1 MG tablet Take 1 tablet (1 mg total) by mouth daily. 90 tablet 4  . Golimumab (SIMPONI) 50 MG/0.5ML SOAJ INJECT 50MG  (1 PEN) UNDER THE SKIN ONCE MONTHLY 0.5 mL 2  . losartan (COZAAR) 100 MG tablet TAKE 1 TABLET (100 MG TOTAL) BY MOUTH DAILY. 90 tablet 1  . lovastatin (MEVACOR) 20 MG tablet TAKE 1 TABLET (20 MG TOTAL) BY MOUTH AT BEDTIME. 90 tablet 1  . methotrexate 50 MG/2ML injection Inject 0.5 mLs (12.5 mg total) into the skin once a week. 6 mL 0  . Multiple Vitamin (MULTIVITAMIN) tablet Take 1 tablet by mouth daily.      . Probiotic Product (VSL#3 PO) Take 1 Units by mouth 2 (two) times daily.    . Tuberculin-Allergy Syringes (B-D ALLERGY SYRINGE 1CC/28G) 28G X 1/2" 1 ML MISC USE AS DIRECTED ONCE WEEKLY 12 each 3   No facility-administered medications prior to visit.      EXAM:  BP 138/62 (BP Location: Right Arm, Patient Position: Sitting, Cuff  Size: Normal)   Pulse 86   Temp (!) 97.5 F (36.4 C) (Oral)   Wt 212 lb 6.4 oz (96.3 kg)   LMP 03/02/1984   BMI 36.74 kg/m   Body mass index  is 36.74 kg/m.  GENERAL: vitals reviewed and listed above, alert, oriented, appears well hydrated and in no acute distress HEENT: atraumatic, conjunctiva  clear, no obvious abnormalities on inspection of external nose and ears NECK: no obvious masses on inspection palpation  LUNGS: clear to auscultation bilaterally, no wheezes, rales or rhonchi, good air movement CV: HRRR, no clubbing cyanosis or  peripheral edema nl cap refill  MS: moves all extremities without noticeable focal  Abnormality  Walking with cane  Stable   Right  Pinky small 6 mm superficial laceration distal no bleeding min erythema   Covered with steri strips  Today .  PSYCH: pleasant and cooperative, no obvious depression or anxiety Lab Results  Component Value Date   WBC 6.1 05/05/2017   HGB 14.1 05/05/2017   HCT 40.0 05/05/2017   PLT 350 05/05/2017   GLUCOSE 84 05/05/2017   CHOL 196 11/30/2016   TRIG 166.0 (H) 11/30/2016   HDL 65.80 11/30/2016   LDLDIRECT 114.0 04/02/2010   LDLCALC 97 11/30/2016   ALT 22 05/05/2017   AST 19 05/05/2017   NA 140 05/05/2017   K 4.4 05/05/2017   CL 104 05/05/2017   CREATININE 0.75 05/05/2017   BUN 13 05/05/2017   CO2 28 05/05/2017   TSH 1.18 11/30/2016   HGBA1C 5.4 11/30/2016   BP Readings from Last 3 Encounters:  06/02/17 138/62  03/15/17 138/78  02/16/17 (!) 149/81   Wt Readings from Last 3 Encounters:  06/02/17 212 lb 6.4 oz (96.3 kg)  03/15/17 215 lb (97.5 kg)  02/16/17 220 lb (99.8 kg)     ASSESSMENT AND PLAN:  Discussed the following assessment and plan:  Essential hypertension  Medication management  Psoriatic arthritis (HCC)  Hyperlipidemia, unspecified hyperlipidemia type  Arthritis of left knee  High risk medication use  Irritable bowel syndrome with diarrhea  Superficial laceration - r pinky finger  Refills meds at this time  And plan cpx  Etc   In about 6 months    ibs  No alarm sx but if  Problematic fever blood etc then may have to fu with  gi  Follow at this time    Expectant management.   -Patient advised to return or notify health care team  if  new concerns arise.  Patient Instructions  bp seems good .  Local care  To laceration. Looks while heal  .    Stay on BP meds as noted now.   Plan    Welcome to medicare   Or cpx in  October  19        Arthelia Callicott K. Mayling Aber M.D.

## 2017-06-02 ENCOUNTER — Encounter: Payer: Self-pay | Admitting: Internal Medicine

## 2017-06-02 ENCOUNTER — Ambulatory Visit (INDEPENDENT_AMBULATORY_CARE_PROVIDER_SITE_OTHER): Payer: Medicare Other | Admitting: Internal Medicine

## 2017-06-02 VITALS — BP 138/62 | HR 86 | Temp 97.5°F | Wt 212.4 lb

## 2017-06-02 DIAGNOSIS — Z79899 Other long term (current) drug therapy: Secondary | ICD-10-CM

## 2017-06-02 DIAGNOSIS — L4050 Arthropathic psoriasis, unspecified: Secondary | ICD-10-CM | POA: Diagnosis not present

## 2017-06-02 DIAGNOSIS — M1712 Unilateral primary osteoarthritis, left knee: Secondary | ICD-10-CM | POA: Diagnosis not present

## 2017-06-02 DIAGNOSIS — K580 Irritable bowel syndrome with diarrhea: Secondary | ICD-10-CM

## 2017-06-02 DIAGNOSIS — E785 Hyperlipidemia, unspecified: Secondary | ICD-10-CM | POA: Diagnosis not present

## 2017-06-02 DIAGNOSIS — T148XXA Other injury of unspecified body region, initial encounter: Secondary | ICD-10-CM

## 2017-06-02 DIAGNOSIS — I10 Essential (primary) hypertension: Secondary | ICD-10-CM | POA: Diagnosis not present

## 2017-06-02 MED ORDER — LOSARTAN POTASSIUM 100 MG PO TABS: 100.0000 mg | ORAL_TABLET | Freq: Every day | ORAL | 3 refills | Status: DC

## 2017-06-02 MED ORDER — LOVASTATIN 20 MG PO TABS: 20.0000 mg | ORAL_TABLET | Freq: Every day | ORAL | 3 refills | Status: DC

## 2017-06-02 MED ORDER — AMLODIPINE BESYLATE 5 MG PO TABS: 5.0000 mg | ORAL_TABLET | Freq: Every day | ORAL | 3 refills | Status: DC

## 2017-06-02 NOTE — Addendum Note (Signed)
Addended by: Virl Cagey on: 06/02/2017 01:00 PM   Modules accepted: Orders

## 2017-06-02 NOTE — Patient Instructions (Addendum)
bp seems good .  Local care  To laceration. Looks while heal  .    Stay on BP meds as noted now.   Plan    Welcome to medicare   Or cpx in  October  19

## 2017-06-11 NOTE — Progress Notes (Signed)
Preop on 06/16/2017.  Need orders in epic.  Thanks

## 2017-06-12 ENCOUNTER — Ambulatory Visit: Payer: Self-pay | Admitting: Orthopedic Surgery

## 2017-06-15 NOTE — Patient Instructions (Addendum)
Jade Cardenas  06/15/2017   Your procedure is scheduled on: 06-21-17   Report to [facility] Main  Entrance Report to Admitting at 8:00 AM    Call this number if you have problems the morning of surgery [phone]   Remember: Do not eat food or drink liquids :After Midnight.     Take these medicines the morning of surgery with A SIP OF WATER: Esomeprazole (Nexium)                                 You may not have any metal on your body including hair pins and              piercings  Do not wear jewelry, make-up, lotions, powders or perfumes, deodorant             Do not wear nail polish.  Do not shave  48 hours prior to surgery.                 Do not bring valuables to the hospital. [name].  Contacts, dentures or bridgework may not be worn into surgery.  Leave suitcase in the car. After surgery it may be brought to your room.                 Please read over the following fact sheets you were given: _____________________________________________________________________             [facility] - Preparing for Surgery Before surgery, you can play an important role.  Because skin is not sterile, your skin needs to be as free of germs as possible.  You can reduce the number of germs on your skin by washing with CHG (chlorahexidine gluconate) soap before surgery.  CHG is an antiseptic cleaner which kills germs and bonds with the skin to continue killing germs even after washing. Please DO NOT use if you have an allergy to CHG or antibacterial soaps.  If your skin becomes reddened/irritated stop using the CHG and inform your nurse when you arrive at Short Stay. Do not shave (including legs and underarms) for at least 48 hours prior to the first CHG shower.  You may shave your face/neck. Please follow these instructions carefully:  1.  Shower with CHG Soap the night before surgery and the  morning of  Surgery.  2.  If you choose to wash your hair, wash your hair first as usual with your  normal  shampoo.  3.  After you shampoo, rinse your hair and body thoroughly to remove the  shampoo.                           4.  Use CHG as you would any other liquid soap.  You can apply chg directly  to the skin and wash                       Gently with a scrungie or clean washcloth.  5.  Apply the CHG Soap to your body ONLY FROM THE NECK DOWN.   Do not use on face/ open  Wound or open sores. Avoid contact with eyes, ears mouth and genitals (private parts).                       Wash face,  Genitals (private parts) with your normal soap.             6.  Wash thoroughly, paying special attention to the area where your surgery  will be performed.  7.  Thoroughly rinse your body with warm water from the neck down.  8.  DO NOT shower/wash with your normal soap after using and rinsing off  the CHG Soap.                9.  Pat yourself dry with a clean towel.            10.  Wear clean pajamas.            11.  Place clean sheets on your bed the night of your first shower and do not  sleep with pets. Day of Surgery : Do not apply any lotions/deodorants the morning of surgery.  Please wear clean clothes to the hospital/surgery center.  FAILURE TO FOLLOW THESE INSTRUCTIONS MAY RESULT IN THE CANCELLATION OF YOUR SURGERY PATIENT SIGNATURE_________________________________  NURSE SIGNATURE__________________________________  ________________________________________________________________________   Jade Cardenas  An incentive spirometer is a tool that can help keep your lungs clear and active. This tool measures how well you are filling your lungs with each breath. Taking long deep breaths may help reverse or decrease the chance of developing breathing (pulmonary) problems (especially infection) following:  A long period of time when you are unable to move or be active. BEFORE  THE PROCEDURE   If the spirometer includes an indicator to show your best effort, your nurse or respiratory therapist will set it to a desired goal.  If possible, sit up straight or lean slightly forward. Try not to slouch.  Hold the incentive spirometer in an upright position. INSTRUCTIONS FOR USE  1. Sit on the edge of your bed if possible, or sit up as far as you can in bed or on a chair. 2. Hold the incentive spirometer in an upright position. 3. Breathe out normally. 4. Place the mouthpiece in your mouth and seal your lips tightly around it. 5. Breathe in slowly and as deeply as possible, raising the piston or the ball toward the top of the column. 6. Hold your breath for 3-5 seconds or for as long as possible. Allow the piston or ball to fall to the bottom of the column. 7. Remove the mouthpiece from your mouth and breathe out normally. 8. Rest for a few seconds and repeat Steps 1 through 7 at least 10 times every 1-2 hours when you are awake. Take your time and take a few normal breaths between deep breaths. 9. The spirometer may include an indicator to show your best effort. Use the indicator as a goal to work toward during each repetition. 10. After each set of 10 deep breaths, practice coughing to be sure your lungs are clear. If you have an incision (the cut made at the time of surgery), support your incision when coughing by placing a pillow or rolled up towels firmly against it. Once you are able to get out of bed, walk around indoors and cough well. You may stop using the incentive spirometer when instructed by your caregiver.  RISKS AND COMPLICATIONS  Take your time so you do not get  dizzy or light-headed.  If you are in pain, you may need to take or ask for pain medication before doing incentive spirometry. It is harder to take a deep breath if you are having pain. AFTER USE  Rest and breathe slowly and easily.  It can be helpful to keep track of a log of your progress.  Your caregiver can provide you with a simple table to help with this. If you are using the spirometer at home, follow these instructions: [place] IF:   You are having difficultly using the spirometer.  You have trouble using the spirometer as often as instructed.  Your pain medication is not giving enough relief while using the spirometer.  You develop fever of 100.5 F (38.1 C) or higher. SEEK IMMEDIATE MEDICAL CARE IF:   You cough up bloody sputum that had not been present before.  You develop fever of 102 F (38.9 C) or greater.  You develop worsening pain at or near the incision site. MAKE SURE YOU:   Understand these instructions.  Will watch your condition.  Will get help right away if you are not doing well or get worse. Document Released: 06/29/2006 Document Revised: 05/11/2011 Document Reviewed: 08/30/2006 ExitCare Patient Information 2014 [employer], [place].   ________________________________________________________________________  WHAT IS A BLOOD TRANSFUSION? Blood Transfusion Information  A transfusion is the replacement of blood or some of its parts. Blood is made up of multiple cells which provide different functions.  Red blood cells carry oxygen and are used for blood loss replacement.  White blood cells fight against infection.  Platelets control bleeding.  Plasma helps clot blood.  Other blood products are available for specialized needs, such as hemophilia or other clotting disorders. BEFORE THE TRANSFUSION  Who gives blood for transfusions?   Healthy volunteers who are fully evaluated to make sure their blood is safe. This is blood bank blood. Transfusion therapy is the safest it has ever been in the practice of medicine. Before blood is taken from a donor, a complete history is taken to make sure that person has no history of diseases nor engages in risky social behavior (examples are intravenous drug use or sexual activity with multiple  partners). The donor's travel history is screened to minimize risk of transmitting infections, such as malaria. The donated blood is tested for signs of infectious diseases, such as HIV and hepatitis. The blood is then tested to be sure it is compatible with you in order to minimize the chance of a transfusion reaction. If you or a relative donates blood, this is often done in anticipation of surgery and is not appropriate for emergency situations. It takes many days to process the donated blood. RISKS AND COMPLICATIONS Although transfusion therapy is very safe and saves many lives, the main dangers of transfusion include:   Getting an infectious disease.  Developing a transfusion reaction. This is an allergic reaction to something in the blood you were given. Every precaution is taken to prevent this. The decision to have a blood transfusion has been considered carefully by your caregiver before blood is given. Blood is not given unless the benefits outweigh the risks. AFTER THE TRANSFUSION  Right after receiving a blood transfusion, you will usually feel much better and more energetic. This is especially true if your red blood cells have gotten low (anemic). The transfusion raises the level of the red blood cells which carry oxygen, and this usually causes an energy increase.  The nurse administering the transfusion will  monitor you carefully for complications. HOME CARE INSTRUCTIONS  No special instructions are needed after a transfusion. You may find your energy is better. Speak with your caregiver about any limitations on activity for underlying diseases you may have. SEEK MEDICAL CARE IF:   Your condition is not improving after your transfusion.  You develop redness or irritation at the intravenous (IV) site. SEEK IMMEDIATE MEDICAL CARE IF:  Any of the following symptoms occur over the next 12 hours:  Shaking chills.  You have a temperature by mouth above 102 F (38.9 C), not  controlled by medicine.  Chest, back, or muscle pain.  People around you feel you are not acting correctly or are confused.  Shortness of breath or difficulty breathing.  Dizziness and fainting.  You get a rash or develop hives.  You have a decrease in urine output.  Your urine turns a dark color or changes to pink, red, or brown. Any of the following symptoms occur over the next 10 days:  You have a temperature by mouth above 102 F (38.9 C), not controlled by medicine.  Shortness of breath.  Weakness after normal activity.  The white part of the eye turns yellow (jaundice).  You have a decrease in the amount of urine or are urinating less often.  Your urine turns a dark color or changes to pink, red, or brown. Document Released: 02/14/2000 Document Revised: 05/11/2011 Document Reviewed: 10/03/2007 [facility] Patient Information 2014 [place], [place].  _______________________________________________________________________

## 2017-06-15 NOTE — Progress Notes (Signed)
11-30-16 Surgical Clearance on chart from Dr. Regis Bill

## 2017-06-16 ENCOUNTER — Encounter (HOSPITAL_COMMUNITY): Payer: Self-pay

## 2017-06-16 ENCOUNTER — Other Ambulatory Visit: Payer: Self-pay

## 2017-06-16 ENCOUNTER — Encounter (HOSPITAL_COMMUNITY)
Admission: RE | Admit: 2017-06-16 | Discharge: 2017-06-16 | Disposition: A | Discharge status: Home / Self Care | Payer: Medicare Other | Source: Ambulatory Visit | Attending: Orthopedic Surgery | Admitting: Orthopedic Surgery

## 2017-06-16 DIAGNOSIS — Z01818 Encounter for other preprocedural examination: Secondary | ICD-10-CM | POA: Diagnosis not present

## 2017-06-16 DIAGNOSIS — M170 Bilateral primary osteoarthritis of knee: Secondary | ICD-10-CM | POA: Insufficient documentation

## 2017-06-16 DIAGNOSIS — Z0183 Encounter for blood typing: Secondary | ICD-10-CM | POA: Insufficient documentation

## 2017-06-16 DIAGNOSIS — Z01812 Encounter for preprocedural laboratory examination: Secondary | ICD-10-CM | POA: Insufficient documentation

## 2017-06-16 DIAGNOSIS — R9431 Abnormal electrocardiogram [ECG] [EKG]: Secondary | ICD-10-CM | POA: Insufficient documentation

## 2017-06-16 DIAGNOSIS — I10 Essential (primary) hypertension: Secondary | ICD-10-CM | POA: Insufficient documentation

## 2017-06-16 DIAGNOSIS — I4510 Unspecified right bundle-branch block: Secondary | ICD-10-CM | POA: Insufficient documentation

## 2017-06-16 LAB — SURGICAL PCR SCREEN
MRSA, PCR: NEGATIVE
Staphylococcus aureus: POSITIVE — AB

## 2017-06-16 LAB — CBC
HCT: 40.7 % (ref 36.0–46.0)
Hemoglobin: 13.8 g/dL (ref 12.0–15.0)
MCH: 32.7 pg (ref 26.0–34.0)
MCHC: 33.9 g/dL (ref 30.0–36.0)
MCV: 96.4 fL (ref 78.0–100.0)
Platelets: 340 10*3/uL (ref 150–400)
RBC: 4.22 MIL/uL (ref 3.87–5.11)
RDW: 13.2 % (ref 11.5–15.5)
WBC: 6 10*3/uL (ref 4.0–10.5)

## 2017-06-16 LAB — PROTIME-INR
INR: 0.96
Prothrombin Time: 12.7 seconds (ref 11.4–15.2)

## 2017-06-16 LAB — COMPREHENSIVE METABOLIC PANEL
ALT: 24 U/L (ref 14–54)
AST: 20 U/L (ref 15–41)
Albumin: 4.3 g/dL (ref 3.5–5.0)
Alkaline Phosphatase: 88 U/L (ref 38–126)
Anion gap: 9 (ref 5–15)
BUN: 17 mg/dL (ref 6–20)
CO2: 27 mmol/L (ref 22–32)
Calcium: 9.7 mg/dL (ref 8.9–10.3)
Chloride: 106 mmol/L (ref 101–111)
Creatinine, Ser: 0.71 mg/dL (ref 0.44–1.00)
GFR calc Af Amer: >60 mL/min (ref >60)
GFR calc non Af Amer: >60 mL/min (ref >60)
Glucose, Bld: 107 mg/dL — ABNORMAL HIGH (ref 65–99)
Potassium: 5.3 mmol/L — ABNORMAL HIGH (ref 3.5–5.1)
Sodium: 142 mmol/L (ref 135–145)
Total Bilirubin: 0.9 mg/dL (ref 0.3–1.2)
Total Protein: 8.2 g/dL — ABNORMAL HIGH (ref 6.5–8.1)

## 2017-06-16 LAB — APTT: aPTT: 34 seconds (ref 24–36)

## 2017-06-16 LAB — ABO/RH: ABO/RH(D): B POS

## 2017-06-16 NOTE — Progress Notes (Signed)
Late entry: Spoke to Dr. Conrad Hes[place]ia, regarding preliminary EKG result which showed T wave abnormality. Pt asymptomatic.... Order received from Dr. Conrad Inyokern to call [facility] Cardiology and see if EKG reading can be expedited. Per contact at [facility], cardiologist read EKG writhin 24 hours, EKG reading cannot be expedited.    Dr. Conrad Logan then requested that we call and speak to the on call PA from [facility] to see if patient could be seen as a consult, or if EKG could be expedited. Spoke to [employer] in chart room, who indicated that she would assist. EKG read by cardiologist, pt can proceed with surgery if she is asymptomatic.  Patient made aware, and indicate that she last saw a cardiologist Dr. Johnsie Cancel between 7-10 years. Has been asymptomatic from a cardiac prospective since that time.

## 2017-06-16 NOTE — Progress Notes (Signed)
In assistance to [facility],, RN who saw patient I called [facility] Cardiology office per Dr Conrad Belva ( anesthesia) order  and asked Doc of Day to please look at ekg from today on patient in comparison with EKG from 2017.  Doc of Day nurse at office asked me to contact Doc of Day at [facility] for [facility] Cardiology and ask them to look at "EKGs and make recommendations.  Paged [facility], Trish and page was returned.  Doc of Day at [facility] for [facility] Cardiology was Dr Skeet Latch. Dr Skeet Latch was sitting beside person who returned the page.  EKGs noted from today and from 2017.   per Dr Skeet Latch.  If patient is asymptomatic no further workup is needed and patient is cleared for surgery.  Above information was reported to [facility], RN who will call patient.  And inform Dr Conrad Clover[place], anesthesia.

## 2017-06-17 ENCOUNTER — Encounter (HOSPITAL_COMMUNITY): Payer: Self-pay | Admitting: *Deleted

## 2017-06-18 ENCOUNTER — Ambulatory Visit: Payer: Self-pay | Admitting: Orthopedic Surgery

## 2017-06-18 NOTE — H&P (Signed)
Jade Cardenas, Jade Cardenas [phone], F)  DOB [birth year]   Chief Complaint Bilateral Knee pain H&P left TKA and Right Knee Cortisone Injection 06-21-2017   Patient's Pharmacies [facility] [address] [facility]): [place], [place] [address], Ph [phone], Fax [phone]   Vitals Ht: 5 ft 4 in  Wt: 216 lbs  BMI: 37.1 BP: 142/88 sitting R arm  Pulse: 76 bpm    Allergies Reviewed Allergies ACE INHIBITORS: Cough  CEPHALEXIN: Hives  ERYTHROMYCIN: Anaphylaxis (Mild to moderate), Facial swelling (Mild to moderate), Hives (Moderate to severe)  HYDROMORPHONE  IODINATED CONTRAST- ORAL AND IV DYE: - Swelling  MORPHINE  NITROFURANTOIN    Medications Reviewed Medications acetaminophen 06/12/17   entered Jade Bayliss, PA-C amLODIPine 5 mg tablet 01/28/17   filled PRIME BD Allergy Syringe 1 mL 28 gauge x 1/2" USE AS DIRECTED ONCE WEEKLY 04/29/17   filled surescripts esomeprazole magnesium 40 mg capsule,delayed release 11/03/16   filled PRIME folic acid 1 mg tablet TAKE 1 TABLET BY MOUTH EVERY DAY 04/27/17   filled surescripts Hair, Skin, Nails with Biotin 06/12/17   entered Jade Peacock, PA-C losartan 100 mg tablet 01/28/17   filled PRIME lovastatin 20 mg tablet 01/28/17   filled PRIME metHOTREXate sodium 25 mg/mL injection solution 02/18/17   filled PRIME multivitamin 06/12/17   entered Jade Odette, PA-C Probiotic 06/12/17   entered Larenz Frasier, PA-C Simponi 50 mg/0.5 mL subcutaneous pen injector 03/11/17   filled PRIME Tylenol PM 06/12/17   entered Jade Rockford, PA-C Vitamin D 06/12/17   entered Leldon Steege, PA-C             Problems Reviewed Problems Osteoarthritis of knee - Onset: 04/10/2017 Osteoarthritis of left knee joint - Onset: 05/11/2017   Family History Reviewed Family History Mother - Diabetes mellitus (onset age: 65)   - Hypertensive disorder (onset age: 65)   - Arthritis (onset age: 65) (died  age: 65)   - Mother deceased   - Family history of cancer of colon   - Endometrial carcinoma Father - Cerebrovascular accident (onset age: 65) (died age: 65)   - Heart disease (onset age: 65)   - Father deceased   - Sepsis syndrome - Sepsis following Bypass Surgery   - Family history of cancer of colon Brother - Diabetes mellitus (onset age: 44)   - Hypertensive disorder   - Hyperlipidemia Maternal Grandmother - Hypertensive disorder   - Cerebrovascular accident   Social History Reviewed Social History Smoking Status: Never smoker Non-smoker Chewing tobacco: none Alcohol intake: None Hand Dominance: Left Work related injury?: N Advance directive: Y Medical Power of Attorney: Y   Surgical History Reviewed Surgical History Total abdominal hysterectomy with bilateral salpingo-oophorectomy - 1986 Reduction mammoplasty - 1989 Cholecystectomy - 1996 Tonsillectomy Cervical arthrodesis - 07/30/2015 Colonoscopy - 06/01/2015 Bunionectomy - 10/03/2014 Lumbar Laminectomy - 08/09/2012 Hysterectomy - 12/04/1984   GYN History Most Recent Mammogram: 06/30/2016.   Past Medical History Reviewed Past Medical History Anemia: Y Fibromyalgia: Y GERD/Reflux: Y High Cholesterol: Y Hypertension: Y Joint Pain: Y Osteoarthritis: Y Previous Cortisone Injection(s): Y Previous Fracture(s): Y Notes: Hyperlipidemia, IBS, Colonic Polyps, Allergic Rhinitis, Spinal Stenosis, Osteopenia, Lumbago, Lumbar Stenosis with Neurogenic Claudication, Tinnitus, History of DES Exposure In Utero, Dermatitis, Iron Deficiency, Hiatal Hernia, Left Bunion, Microcytic Hypchromic Anemia, Cervical Foraminal Stenosis, Heel Spur, Spondylarthrosis, Fibromyalgia, Varicose Veins, Goiter, Right Bundle Branch Block, Rheumatoid Arthritis, History of Syncope, History of Palpitations, Seasonal Allergies, Insomnia, Psoriatic Arthritis, Childhood Measels, Childhood Mumps, Childhoos Rubella    HPI The  patient is here  today for a pre-operative History and Physical. They are scheduled for left total knee replacement and a right knee cortisone injection on 06-21-2017 with Dr. Wynelle Link at [facility]. Patient has been seen for her ongoing left knee greater than right knee pain. She states she has been dealing with the left knee since March of 2018, with known injury. The knee does give way so she has to walk with a cane. She has tried cortisone, viscosupplementation and PT with no relief. Her knee does swell and it wakes her at night, throbbing. She was seen by Dr. Estanislado Pandy and had left knee and pelvis xrays and a left knee MRI. She takes Tylenol for pain and topical creams as needed. [name] has had progressively worsening LEFT knee pain since this started several months ago. She is at a stage where the knee is hurting at all times. It is limiting what she can and cannot do. She even has pain at rest and at night. Other modalities have not helped at all. She is at a stage now where she feels like she needs a more permanent solution to this knee problem. Her right knee has also been a chronic problem for her in the past. She has been diagnosed with osteoarthritis in that knee as well. She would like to get an injection in that knee at the same time as her surgery on the left knee. AP and lateral of the LEFT knee as well as MRI of the LEFT knee from August 2018 show that she does not have any significant degenerative change in the knee. There is some patellofemoral and medial narrowing. MRI shows some mild degenerative change but no meniscal tears. Given her rapid progression of symptoms and lack of response to injections, she had recent repeat x-rays that showed that she is now essentially bone-on-bone in the medial compartment. She also has an osteochondral defect in the medial tibial plateau. She has had a rapid progression of her symptoms as well as radiographic progression of the arthritis. Patient has significant  pain and dysfunction in their knee. They have had injections and failed nonoperative management. At this point the pain and dysfunction have essentially taken over their life. The most predictable means of improving pain and function is total knee arthroplasty. She is ready to proceed with surgery on the left knee and asked to receive a cortisone injection into the right knee at the same time.   ROS CONSTITUTIONAL: no Fever, no Weight Gain, no Weight Loss  HEAD, EARS, EYES, NOSE, AND THROAT: no Dry Eyes, no Eye Irritation, no Vision Changes, no Sore Throat  CARDIOVASCULAR: no Chest Pain / Angina, no Palpitations, no Poor Circulation, no Leg Swelling  RESPIRATORY: no Cough, no Shortness of Breath, no COPD, no Asthma  GASTROINTESTINAL: DIARRHEA, but no Nausea / Vomiting, no Black Stools / Blood in Stool, no Vomiting Blood  GENITOURINARY: no Blood in Urine, no Difficulty Urinating  MUSCULOSKELETAL: JOINT PAIN, JOINT SWELLING, but no Rheumatoid Arthritis, no Lost 2 or more inches in Height  SKIN: VARICOSE VEINS, but no Rash  NEUROLOGIC: no Numbness, no Seizures, no Dizziness, no Problems with Balance  PSYCHIATRIC: no Anxiety, no Depression  ENDOCRINE: no Heat Intolerance  BLOOD / LYMPH: ANEMIA, BRUISE EASILY, but no Bleed Easily, no Swollen Glands  Physical Exam Patient is a 67 year old female.  General Mental Status - Alert, cooperative and good historian. General Appearance - pleasant, Not in acute distress. Orientation - Oriented X3. Build & Nutrition -  Well nourished and Well developed.  Head and Neck Head - normocephalic, atraumatic . Neck Global Assessment - supple, no bruit auscultated on the right, no bruit auscultated on the left.  Eye Pupil - Bilateral - PERR Motion - Bilateral - EOMI.  Chest and Lung Exam Auscultation Breath sounds - clear at anterior chest wall and clear at posterior chest wall. Adventitious sounds - No Adventitious  sounds.  Cardiovascular Auscultation Rhythm - Regular rate and rhythm. Heart Sounds - S1 WNL and S2 WNL. Murmurs & Other Heart Sounds - Auscultation of the heart reveals - No Murmurs.  Abdomen Palpation/Percussion Tenderness - Abdomen is non-tender to palpation. Abdomen is soft. Auscultation Auscultation of the abdomen reveals - Bowel sounds normal.  Genitourinary Note: Not done, not pertinent to present illness  Musculoskeletal Examination of the right hip shows flexion to 120 rotation in 30 abduction 40 and external rotation of 40. There is no tenderness over the greater trochanter. There is no pain on provocative testing of the hip.Evaluation of the left hip shows flexion to 120 rotation in 30 out 40 and abduction 40 without discomfort. There is no tenderness over the greater trochanter. There is no pain on provocative testing of the hip.Right knee shows no effusion. Range of motion is 0-125. There is no crepitus on range of motion of the knee. There is no medial or lateral joint line tenderness. There is no instability noted. Her LEFT knee shows no effusion. Her range of motion is 5-125. There is moderate crepitus on range of motion. She is tender medial greater than lateral. There is no instability noted. Pulses, sensation and motor are intact. She has an antalgic gait pattern on the LEFT.  Radiographs-AP and lateral of the LEFT knee as well as MRI of the LEFT knee from August 2018 show that she does not have any significant degenerative change in the knee. There is some patellofemoral and medial narrowing. MRI shows some mild degenerative change but no meniscal tears. Repeat x-rays recently showed that she is now essentially bone-on-bone in the medial compartment. She also has an osteochondral defect in the medial tibial plateau.   Assessment / Plan 1. Osteoarthritis of left knee joint M17.12: Unilateral primary osteoarthritis, left knee  2. Osteoarthritis of right knee  joint M17.11: Unilateral primary osteoarthritis, right knee  Goals Patient Instructions Surgical Plans: Left Total Knee Replacement and a Right Knee Cortisone Injection Disposition: Home, Straight to outpatient at [facility] PT PCP: Dr. Regis Bill - seen and cleared to proceed for surgery IV TXA Anesthesia Issues: Nausea Patient was instructed on what medications to stop prior to surgery. - Follow up visit in 2 weeks with Dr. Wynelle Link - Begin physical therapy following surgery - Provider to contact patient to setup first PT Eval on Thursday 06/24/2017 - Dr. Wynelle Link - Left TKA - Pre-operative lab work as pre Pre-Surgical Testing - Prescriptions will be provided in hospital at time of discharge  Return to [place], [place] for [facility] on 07/06/2017 at 02:00 PM  Encounter signed-off by Mickel Crow, PA-C

## 2017-06-18 NOTE — H&P (View-Only) (Signed)
Jade, Cardenas [phone], F)  DOB December 10, 1950   Chief Complaint Bilateral Knee pain H&P left TKA and Right Knee Cortisone Injection 06-21-2017   Patient's Pharmacies [facility] [address] [facility]): [place], [place] [address], Ph [phone], Fax [phone]) [phone]   Vitals Ht: 5 ft 4 in  Wt: 216 lbs  BMI: 37.1 BP: 142/88 sitting R arm  Pulse: 76 bpm    Allergies Reviewed Allergies ACE INHIBITORS: Cough  CEPHALEXIN: Hives  ERYTHROMYCIN: Anaphylaxis (Mild to moderate), Facial swelling (Mild to moderate), Hives (Moderate to severe)  HYDROMORPHONE  IODINATED CONTRAST- ORAL AND IV DYE: - Swelling  MORPHINE  NITROFURANTOIN    Medications Reviewed Medications acetaminophen 06/12/17   entered Johnelle Tafolla, PA-C amLODIPine 5 mg tablet 01/28/17   filled PRIME BD Allergy Syringe 1 mL 28 gauge x 1/2" USE AS DIRECTED ONCE WEEKLY 04/29/17   filled surescripts esomeprazole magnesium 40 mg capsule,delayed release 11/03/16   filled PRIME folic acid 1 mg tablet TAKE 1 TABLET BY MOUTH EVERY DAY 04/27/17   filled surescripts Hair, Skin, Nails with Biotin 06/12/17   entered Hoyle Barkdull, PA-C losartan 100 mg tablet 01/28/17   filled PRIME lovastatin 20 mg tablet 01/28/17   filled PRIME metHOTREXate sodium 25 mg/mL injection solution 02/18/17   filled PRIME multivitamin 06/12/17   entered Dynver Clemson, PA-C Probiotic 06/12/17   entered Felice Hope, PA-C Simponi 50 mg/0.5 mL subcutaneous pen injector 03/11/17   filled PRIME Tylenol PM 06/12/17   entered Graylon Amory, PA-C Vitamin D 06/12/17   entered Jerrie Gullo, PA-C             Problems Reviewed Problems Osteoarthritis of knee - Onset: 04/10/2017 Osteoarthritis of left knee joint - Onset: 05/11/2017   Family History Reviewed Family History Mother - Diabetes mellitus (onset age: 54)   - Hypertensive disorder (onset age: 44)   - Arthritis (onset age: 51) (died  age: 70)   - Mother deceased   - Family history of cancer of colon   - Endometrial carcinoma Father - Cerebrovascular accident (onset age: 41) (died age: 63)   - Heart disease (onset age: 28)   - Father deceased   - Sepsis syndrome - Sepsis following Bypass Surgery   - Family history of cancer of colon Brother - Diabetes mellitus (onset age: 38)   - Hypertensive disorder   - Hyperlipidemia Maternal Grandmother - Hypertensive disorder   - Cerebrovascular accident   Social History Reviewed Social History Smoking Status: Never smoker Non-smoker Chewing tobacco: none Alcohol intake: None Hand Dominance: Left Work related injury?: N Advance directive: Y Medical Power of Attorney: Y   Surgical History Reviewed Surgical History Total abdominal hysterectomy with bilateral salpingo-oophorectomy - 1986 Reduction mammoplasty - 1989 Cholecystectomy - 1996 Tonsillectomy Cervical arthrodesis - 07/30/2015 Colonoscopy - 06/01/2015 Bunionectomy - 10/03/2014 Lumbar Laminectomy - 08/09/2012 Hysterectomy - 12/04/1984   GYN History Most Recent Mammogram: 06/30/2016.   Past Medical History Reviewed Past Medical History Anemia: Y Fibromyalgia: Y GERD/Reflux: Y High Cholesterol: Y Hypertension: Y Joint Pain: Y Osteoarthritis: Y Previous Cortisone Injection(s): Y Previous Fracture(s): Y Notes: Hyperlipidemia, IBS, Colonic Polyps, Allergic Rhinitis, Spinal Stenosis, Osteopenia, Lumbago, Lumbar Stenosis with Neurogenic Claudication, Tinnitus, History of DES Exposure In Utero, Dermatitis, Iron Deficiency, Hiatal Hernia, Left Bunion, Microcytic Hypchromic Anemia, Cervical Foraminal Stenosis, Heel Spur, Spondylarthrosis, Fibromyalgia, Varicose Veins, Goiter, Right Bundle Branch Block, Rheumatoid Arthritis, History of Syncope, History of Palpitations, Seasonal Allergies, Insomnia, Psoriatic Arthritis, Childhood Measels, Childhood Mumps, Childhoos Rubella    HPI The  patient is here  today for a pre-operative History and Physical. They are scheduled for left total knee replacement and a right knee cortisone injection on 06-21-2017 with Dr. Wynelle Link at [facility]. Patient has been seen for her ongoing left knee greater than right knee pain. She states she has been dealing with the left knee since March of 2018, with known injury. The knee does give way so she has to walk with a cane. She has tried cortisone, viscosupplementation and PT with no relief. Her knee does swell and it wakes her at night, throbbing. She was seen by Dr. Estanislado Pandy and had left knee and pelvis xrays and a left knee MRI. She takes Tylenol for pain and topical creams as needed. Jade Cardenas has had progressively worsening LEFT knee pain since this started several months ago. She is at a stage where the knee is hurting at all times. It is limiting what she can and cannot do. She even has pain at rest and at night. Other modalities have not helped at all. She is at a stage now where she feels like she needs a more permanent solution to this knee problem. Her right knee has also been a chronic problem for her in the past. She has been diagnosed with osteoarthritis in that knee as well. She would like to get an injection in that knee at the same time as her surgery on the left knee. AP and lateral of the LEFT knee as well as MRI of the LEFT knee from August 2018 show that she does not have any significant degenerative change in the knee. There is some patellofemoral and medial narrowing. MRI shows some mild degenerative change but no meniscal tears. Given her rapid progression of symptoms and lack of response to injections, she had recent repeat x-rays that showed that she is now essentially bone-on-bone in the medial compartment. She also has an osteochondral defect in the medial tibial plateau. She has had a rapid progression of her symptoms as well as radiographic progression of the arthritis. Patient has significant  pain and dysfunction in their knee. They have had injections and failed nonoperative management. At this point the pain and dysfunction have essentially taken over their life. The most predictable means of improving pain and function is total knee arthroplasty. She is ready to proceed with surgery on the left knee and asked to receive a cortisone injection into the right knee at the same time.   ROS CONSTITUTIONAL: no Fever, no Weight Gain, no Weight Loss  HEAD, EARS, EYES, NOSE, AND THROAT: no Dry Eyes, no Eye Irritation, no Vision Changes, no Sore Throat  CARDIOVASCULAR: no Chest Pain / Angina, no Palpitations, no Poor Circulation, no Leg Swelling  RESPIRATORY: no Cough, no Shortness of Breath, no COPD, no Asthma  GASTROINTESTINAL: DIARRHEA, but no Nausea / Vomiting, no Black Stools / Blood in Stool, no Vomiting Blood  GENITOURINARY: no Blood in Urine, no Difficulty Urinating  MUSCULOSKELETAL: JOINT PAIN, JOINT SWELLING, but no Rheumatoid Arthritis, no Lost 2 or more inches in Height  SKIN: VARICOSE VEINS, but no Rash  NEUROLOGIC: no Numbness, no Seizures, no Dizziness, no Problems with Balance  PSYCHIATRIC: no Anxiety, no Depression  ENDOCRINE: no Heat Intolerance  BLOOD / LYMPH: ANEMIA, BRUISE EASILY, but no Bleed Easily, no Swollen Glands  Physical Exam Patient is a 67 year old female.  General Mental Status - Alert, cooperative and good historian. General Appearance - pleasant, Not in acute distress. Orientation - Oriented X3. Build & Nutrition -  Well nourished and Well developed.  Head and Neck Head - normocephalic, atraumatic . Neck Global Assessment - supple, no bruit auscultated on the right, no bruit auscultated on the left.  Eye Pupil - Bilateral - PERR Motion - Bilateral - EOMI.  Chest and Lung Exam Auscultation Breath sounds - clear at anterior chest wall and clear at posterior chest wall. Adventitious sounds - No Adventitious  sounds.  Cardiovascular Auscultation Rhythm - Regular rate and rhythm. Heart Sounds - S1 WNL and S2 WNL. Murmurs & Other Heart Sounds - Auscultation of the heart reveals - No Murmurs.  Abdomen Palpation/Percussion Tenderness - Abdomen is non-tender to palpation. Abdomen is soft. Auscultation Auscultation of the abdomen reveals - Bowel sounds normal.  Genitourinary Note: Not done, not pertinent to present illness  Musculoskeletal Examination of the right hip shows flexion to 120 rotation in 30 abduction 40 and external rotation of 40. There is no tenderness over the greater trochanter. There is no pain on provocative testing of the hip.Evaluation of the left hip shows flexion to 120 rotation in 30 out 40 and abduction 40 without discomfort. There is no tenderness over the greater trochanter. There is no pain on provocative testing of the hip.Right knee shows no effusion. Range of motion is 0-125. There is no crepitus on range of motion of the knee. There is no medial or lateral joint line tenderness. There is no instability noted. Her LEFT knee shows no effusion. Her range of motion is 5-125. There is moderate crepitus on range of motion. She is tender medial greater than lateral. There is no instability noted. Pulses, sensation and motor are intact. She has an antalgic gait pattern on the LEFT.  Radiographs-AP and lateral of the LEFT knee as well as MRI of the LEFT knee from August 2018 show that she does not have any significant degenerative change in the knee. There is some patellofemoral and medial narrowing. MRI shows some mild degenerative change but no meniscal tears. Repeat x-rays recently showed that she is now essentially bone-on-bone in the medial compartment. She also has an osteochondral defect in the medial tibial plateau.   Assessment / Plan 1. Osteoarthritis of left knee joint M17.12: Unilateral primary osteoarthritis, left knee  2. Osteoarthritis of right knee  joint M17.11: Unilateral primary osteoarthritis, right knee  Goals Patient Instructions Surgical Plans: Left Total Knee Replacement and a Right Knee Cortisone Injection Disposition: Home, Straight to outpatient at [facility] PT PCP: Dr. Regis Bill - seen and cleared to proceed for surgery IV TXA Anesthesia Issues: Nausea Patient was instructed on what medications to stop prior to surgery. - Follow up visit in 2 weeks with Dr. Wynelle Link - Begin physical therapy following surgery - Provider to contact patient to setup first PT Eval on Thursday 06/24/2017 - Dr. Wynelle Link - Left TKA - Pre-operative lab work as pre Pre-Surgical Testing - Prescriptions will be provided in hospital at time of discharge  Return to [place], MD for [facility] on 07/06/2017 at 02:00 PM  Encounter signed-off by Mickel Crow, PA-C

## 2017-06-20 MED ORDER — SODIUM CHLORIDE 0.9 % IV SOLN
1000.0000 mg | INTRAVENOUS | Status: AC
  Administered 2017-06-21: 1000 mg via INTRAVENOUS
  Filled 2017-06-20: qty 1100

## 2017-06-20 MED ORDER — BUPIVACAINE LIPOSOME 1.3 % IJ SUSP
20.0000 mL | INTRAMUSCULAR | Status: DC
  Filled 2017-06-20: qty 20

## 2017-06-21 ENCOUNTER — Other Ambulatory Visit: Payer: Self-pay

## 2017-06-21 ENCOUNTER — Inpatient Hospital Stay (HOSPITAL_COMMUNITY): Payer: Medicare Other | Admitting: Anesthesiology

## 2017-06-21 ENCOUNTER — Inpatient Hospital Stay (HOSPITAL_COMMUNITY)
Admission: RE | Admit: 2017-06-21 | Discharge: 2017-06-25 | DRG: 470 | Disposition: A | Discharge status: Home / Self Care | Payer: Medicare Other | Source: Ambulatory Visit | Attending: Orthopedic Surgery | Admitting: Orthopedic Surgery

## 2017-06-21 ENCOUNTER — Encounter (HOSPITAL_COMMUNITY): Payer: Self-pay

## 2017-06-21 ENCOUNTER — Encounter (HOSPITAL_COMMUNITY)
Admission: RE | Disposition: A | Discharge status: Home / Self Care | Payer: Self-pay | Source: Ambulatory Visit | Attending: Orthopedic Surgery

## 2017-06-21 DIAGNOSIS — Z90722 Acquired absence of ovaries, bilateral: Secondary | ICD-10-CM

## 2017-06-21 DIAGNOSIS — Z981 Arthrodesis status: Secondary | ICD-10-CM

## 2017-06-21 DIAGNOSIS — M25562 Pain in left knee: Secondary | ICD-10-CM

## 2017-06-21 DIAGNOSIS — M84452A Pathological fracture, left femur, initial encounter for fracture: Secondary | ICD-10-CM | POA: Diagnosis present

## 2017-06-21 DIAGNOSIS — I739 Peripheral vascular disease, unspecified: Secondary | ICD-10-CM | POA: Diagnosis present

## 2017-06-21 DIAGNOSIS — Z9079 Acquired absence of other genital organ(s): Secondary | ICD-10-CM

## 2017-06-21 DIAGNOSIS — K219 Gastro-esophageal reflux disease without esophagitis: Secondary | ICD-10-CM | POA: Diagnosis present

## 2017-06-21 DIAGNOSIS — E7800 Pure hypercholesterolemia, unspecified: Secondary | ICD-10-CM | POA: Diagnosis present

## 2017-06-21 DIAGNOSIS — K589 Irritable bowel syndrome without diarrhea: Secondary | ICD-10-CM | POA: Diagnosis not present

## 2017-06-21 DIAGNOSIS — D509 Iron deficiency anemia, unspecified: Secondary | ICD-10-CM | POA: Diagnosis present

## 2017-06-21 DIAGNOSIS — Z90710 Acquired absence of both cervix and uterus: Secondary | ICD-10-CM | POA: Diagnosis not present

## 2017-06-21 DIAGNOSIS — Z881 Allergy status to other antibiotic agents status: Secondary | ICD-10-CM

## 2017-06-21 DIAGNOSIS — E785 Hyperlipidemia, unspecified: Secondary | ICD-10-CM | POA: Diagnosis present

## 2017-06-21 DIAGNOSIS — Z888 Allergy status to other drugs, medicaments and biological substances status: Secondary | ICD-10-CM | POA: Diagnosis not present

## 2017-06-21 DIAGNOSIS — J302 Other seasonal allergic rhinitis: Secondary | ICD-10-CM | POA: Diagnosis present

## 2017-06-21 DIAGNOSIS — M179 Osteoarthritis of knee, unspecified: Secondary | ICD-10-CM | POA: Diagnosis present

## 2017-06-21 DIAGNOSIS — M170 Bilateral primary osteoarthritis of knee: Principal | ICD-10-CM | POA: Diagnosis present

## 2017-06-21 DIAGNOSIS — I10 Essential (primary) hypertension: Secondary | ICD-10-CM | POA: Diagnosis present

## 2017-06-21 DIAGNOSIS — G4700 Insomnia, unspecified: Secondary | ICD-10-CM | POA: Diagnosis present

## 2017-06-21 DIAGNOSIS — M069 Rheumatoid arthritis, unspecified: Secondary | ICD-10-CM | POA: Diagnosis present

## 2017-06-21 DIAGNOSIS — Z91041 Radiographic dye allergy status: Secondary | ICD-10-CM

## 2017-06-21 DIAGNOSIS — Z79899 Other long term (current) drug therapy: Secondary | ICD-10-CM | POA: Diagnosis not present

## 2017-06-21 DIAGNOSIS — Z86010 Personal history of colonic polyps: Secondary | ICD-10-CM | POA: Diagnosis not present

## 2017-06-21 DIAGNOSIS — M797 Fibromyalgia: Secondary | ICD-10-CM | POA: Diagnosis present

## 2017-06-21 DIAGNOSIS — M1710 Unilateral primary osteoarthritis, unspecified knee: Secondary | ICD-10-CM | POA: Diagnosis present

## 2017-06-21 DIAGNOSIS — Z885 Allergy status to narcotic agent status: Secondary | ICD-10-CM

## 2017-06-21 DIAGNOSIS — Z9049 Acquired absence of other specified parts of digestive tract: Secondary | ICD-10-CM

## 2017-06-21 HISTORY — PX: TOTAL KNEE ARTHROPLASTY: SHX125

## 2017-06-21 LAB — TYPE AND SCREEN
ABO/RH(D): B POS
Antibody Screen: NEGATIVE

## 2017-06-21 SURGERY — ARTHROPLASTY, KNEE, TOTAL
Anesthesia: General

## 2017-06-21 MED ORDER — MIDAZOLAM HCL 5 MG/5ML IJ SOLN
INTRAMUSCULAR | Status: DC | PRN
  Administered 2017-06-21: 2 mg via INTRAVENOUS

## 2017-06-21 MED ORDER — PROPOFOL 10 MG/ML IV BOLUS
INTRAVENOUS | Status: DC | PRN
  Administered 2017-06-21: 160 mg via INTRAVENOUS

## 2017-06-21 MED ORDER — ROPIVACAINE HCL 7.5 MG/ML IJ SOLN
INTRAMUSCULAR | Status: DC | PRN
  Administered 2017-06-21: 20 mL via PERINEURAL

## 2017-06-21 MED ORDER — FENTANYL CITRATE (PF) 100 MCG/2ML IJ SOLN
INTRAMUSCULAR | Status: DC | PRN
  Administered 2017-06-21 (×3): 50 ug via INTRAVENOUS

## 2017-06-21 MED ORDER — METOCLOPRAMIDE HCL 5 MG PO TABS
5.0000 mg | ORAL_TABLET | Freq: Three times a day (TID) | ORAL | Status: DC | PRN
Start: 2017-06-21 — End: 2017-06-25

## 2017-06-21 MED ORDER — ACETAMINOPHEN 325 MG PO TABS
325.0000 mg | ORAL_TABLET | Freq: Four times a day (QID) | ORAL | Status: DC | PRN
  Administered 2017-06-23: 650 mg via ORAL
  Filled 2017-06-21 (×2): qty 2

## 2017-06-21 MED ORDER — HYDROMORPHONE HCL 1 MG/ML IJ SOLN
0.2500 mg | INTRAMUSCULAR | Status: DC | PRN
  Administered 2017-06-21 (×2): 0.5 mg via INTRAVENOUS

## 2017-06-21 MED ORDER — METHYLPREDNISOLONE ACETATE 40 MG/ML IJ SUSP
INTRAMUSCULAR | Status: DC | PRN
  Administered 2017-06-21: 80 mg

## 2017-06-21 MED ORDER — HYDROMORPHONE HCL 1 MG/ML IJ SOLN
INTRAMUSCULAR | Status: AC
  Administered 2017-06-21: 0.5 mg via INTRAVENOUS
  Filled 2017-06-21: qty 1

## 2017-06-21 MED ORDER — SCOPOLAMINE 1 MG/3DAYS TD PT72
1.0000 | MEDICATED_PATCH | TRANSDERMAL | Status: DC
  Administered 2017-06-21: 1.5 mg via TRANSDERMAL
  Filled 2017-06-21: qty 1

## 2017-06-21 MED ORDER — FENTANYL CITRATE (PF) 100 MCG/2ML IJ SOLN
INTRAMUSCULAR | Status: AC
  Filled 2017-06-21: qty 2

## 2017-06-21 MED ORDER — LOSARTAN POTASSIUM 50 MG PO TABS
100.0000 mg | ORAL_TABLET | Freq: Every day | ORAL | Status: DC
  Administered 2017-06-22 – 2017-06-24 (×3): 100 mg via ORAL
  Filled 2017-06-21 (×3): qty 2

## 2017-06-21 MED ORDER — OXYCODONE HCL 5 MG PO TABS
5.0000 mg | ORAL_TABLET | ORAL | Status: DC | PRN
  Administered 2017-06-21 (×4): 5 mg via ORAL
  Administered 2017-06-22 – 2017-06-23 (×3): 10 mg via ORAL
  Filled 2017-06-21 (×2): qty 2
  Filled 2017-06-21 (×2): qty 1
  Filled 2017-06-21: qty 2
  Filled 2017-06-21: qty 1

## 2017-06-21 MED ORDER — FENTANYL CITRATE (PF) 100 MCG/2ML IJ SOLN
50.0000 ug | INTRAMUSCULAR | Status: DC
Start: 2017-06-21 — End: 2017-06-21
  Administered 2017-06-21: 50 ug via INTRAVENOUS
  Filled 2017-06-21: qty 2

## 2017-06-21 MED ORDER — BISACODYL 10 MG RE SUPP: 10.0000 mg | Freq: Every day | RECTAL | Status: DC | PRN

## 2017-06-21 MED ORDER — GABAPENTIN 300 MG PO CAPS
300.0000 mg | ORAL_CAPSULE | Freq: Three times a day (TID) | ORAL | Status: DC
  Administered 2017-06-21 – 2017-06-25 (×12): 300 mg via ORAL
  Filled 2017-06-21 (×12): qty 1

## 2017-06-21 MED ORDER — LACTATED RINGERS IV SOLN
INTRAVENOUS | Status: DC
  Administered 2017-06-21 (×2): via INTRAVENOUS

## 2017-06-21 MED ORDER — BUPIVACAINE LIPOSOME 1.3 % IJ SUSP
INTRAMUSCULAR | Status: DC | PRN
  Administered 2017-06-21: 20 mL

## 2017-06-21 MED ORDER — MENTHOL 3 MG MT LOZG: 1.0000 | LOZENGE | OROMUCOSAL | Status: DC | PRN

## 2017-06-21 MED ORDER — MEPERIDINE HCL 50 MG/ML IJ SOLN
INTRAMUSCULAR | Status: AC
  Filled 2017-06-21: qty 1

## 2017-06-21 MED ORDER — PROPOFOL 10 MG/ML IV BOLUS
INTRAVENOUS | Status: AC
  Filled 2017-06-21: qty 20

## 2017-06-21 MED ORDER — DEXAMETHASONE SODIUM PHOSPHATE 10 MG/ML IJ SOLN
10.0000 mg | Freq: Once | INTRAMUSCULAR | Status: AC
  Administered 2017-06-21: 10 mg via INTRAVENOUS

## 2017-06-21 MED ORDER — RIVAROXABAN 10 MG PO TABS: 10.0000 mg | ORAL_TABLET | Freq: Every day | ORAL | 0 refills | Status: DC

## 2017-06-21 MED ORDER — PHENYLEPHRINE 40 MCG/ML (10ML) SYRINGE FOR IV PUSH (FOR BLOOD PRESSURE SUPPORT)
PREFILLED_SYRINGE | INTRAVENOUS | Status: DC | PRN
  Administered 2017-06-21: 80 ug via INTRAVENOUS

## 2017-06-21 MED ORDER — ONDANSETRON HCL 4 MG PO TABS: 4.0000 mg | ORAL_TABLET | Freq: Four times a day (QID) | ORAL | Status: DC | PRN

## 2017-06-21 MED ORDER — GABAPENTIN 300 MG PO CAPS
300.0000 mg | ORAL_CAPSULE | Freq: Once | ORAL | Status: AC
  Administered 2017-06-21: 300 mg via ORAL
  Filled 2017-06-21: qty 1

## 2017-06-21 MED ORDER — PHENOL 1.4 % MT LIQD
1.0000 | OROMUCOSAL | Status: DC | PRN
  Administered 2017-06-21: 1 via OROMUCOSAL
  Filled 2017-06-21 (×2): qty 177

## 2017-06-21 MED ORDER — METHOCARBAMOL 1000 MG/10ML IJ SOLN
500.0000 mg | Freq: Four times a day (QID) | INTRAVENOUS | Status: DC | PRN
  Administered 2017-06-21: 500 mg via INTRAVENOUS
  Filled 2017-06-21: qty 550

## 2017-06-21 MED ORDER — METHOCARBAMOL 500 MG PO TABS: 500.0000 mg | ORAL_TABLET | Freq: Four times a day (QID) | ORAL | 0 refills | Status: DC | PRN

## 2017-06-21 MED ORDER — ACETAMINOPHEN 10 MG/ML IV SOLN
1000.0000 mg | Freq: Once | INTRAVENOUS | Status: AC
  Administered 2017-06-21: 1000 mg via INTRAVENOUS
  Filled 2017-06-21: qty 100

## 2017-06-21 MED ORDER — TRAMADOL HCL 50 MG PO TABS
50.0000 mg | ORAL_TABLET | Freq: Four times a day (QID) | ORAL | Status: DC | PRN
  Administered 2017-06-22: 50 mg via ORAL
  Administered 2017-06-23 – 2017-06-25 (×2): 100 mg via ORAL
  Filled 2017-06-21: qty 2
  Filled 2017-06-21: qty 1
  Filled 2017-06-21: qty 2

## 2017-06-21 MED ORDER — ONDANSETRON HCL 4 MG/2ML IJ SOLN: 4.0000 mg | Freq: Four times a day (QID) | INTRAMUSCULAR | Status: DC | PRN

## 2017-06-21 MED ORDER — TRAMADOL HCL 50 MG PO TABS: 50.0000 mg | ORAL_TABLET | Freq: Four times a day (QID) | ORAL | Status: DC | PRN

## 2017-06-21 MED ORDER — LIDOCAINE 2% (20 MG/ML) 5 ML SYRINGE
INTRAMUSCULAR | Status: DC | PRN
  Administered 2017-06-21: 60 mg via INTRAVENOUS
  Administered 2017-06-21: 2 mg via INTRAVENOUS

## 2017-06-21 MED ORDER — TRAMADOL HCL 50 MG PO TABS: 50.0000 mg | ORAL_TABLET | Freq: Four times a day (QID) | ORAL | 0 refills | Status: DC | PRN

## 2017-06-21 MED ORDER — OXYCODONE HCL 5 MG PO TABS
10.0000 mg | ORAL_TABLET | ORAL | Status: DC | PRN
  Administered 2017-06-22 (×3): 10 mg via ORAL
  Administered 2017-06-23 (×2): 15 mg via ORAL
  Filled 2017-06-21: qty 2
  Filled 2017-06-21: qty 3
  Filled 2017-06-21: qty 2
  Filled 2017-06-21: qty 3
  Filled 2017-06-21 (×2): qty 2

## 2017-06-21 MED ORDER — METHYLPREDNISOLONE ACETATE 40 MG/ML IJ SUSP
INTRAMUSCULAR | Status: AC
  Filled 2017-06-21: qty 1

## 2017-06-21 MED ORDER — POLYETHYLENE GLYCOL 3350 17 G PO PACK
17.0000 g | PACK | Freq: Every day | ORAL | Status: DC | PRN
  Administered 2017-06-22: 17 g via ORAL
  Filled 2017-06-21: qty 1

## 2017-06-21 MED ORDER — SODIUM CHLORIDE 0.9 % IV SOLN
INTRAVENOUS | Status: DC
  Administered 2017-06-21: 14:00:00 via INTRAVENOUS

## 2017-06-21 MED ORDER — MIDAZOLAM HCL 2 MG/2ML IJ SOLN
INTRAMUSCULAR | Status: AC
  Filled 2017-06-21: qty 2

## 2017-06-21 MED ORDER — SODIUM CHLORIDE 0.9 % IJ SOLN
INTRAMUSCULAR | Status: AC
  Filled 2017-06-21: qty 10

## 2017-06-21 MED ORDER — OXYCODONE HCL 5 MG PO TABS: 5.0000 mg | ORAL_TABLET | ORAL | 0 refills | Status: DC | PRN

## 2017-06-21 MED ORDER — DIPHENHYDRAMINE HCL 12.5 MG/5ML PO ELIX
12.5000 mg | ORAL_SOLUTION | ORAL | Status: DC | PRN
  Administered 2017-06-22: 25 mg via ORAL
  Filled 2017-06-21: qty 10

## 2017-06-21 MED ORDER — PANTOPRAZOLE SODIUM 40 MG PO TBEC
80.0000 mg | DELAYED_RELEASE_TABLET | Freq: Every day | ORAL | Status: DC
  Administered 2017-06-22 – 2017-06-25 (×4): 80 mg via ORAL
  Filled 2017-06-21 (×4): qty 2

## 2017-06-21 MED ORDER — TRANEXAMIC ACID 1000 MG/10ML IV SOLN
1000.0000 mg | Freq: Once | INTRAVENOUS | Status: AC
  Administered 2017-06-21: 1000 mg via INTRAVENOUS
  Filled 2017-06-21: qty 1100

## 2017-06-21 MED ORDER — VANCOMYCIN HCL IN DEXTROSE 1-5 GM/200ML-% IV SOLN
1000.0000 mg | INTRAVENOUS | Status: AC
  Administered 2017-06-21: 1000 mg via INTRAVENOUS
  Filled 2017-06-21: qty 200

## 2017-06-21 MED ORDER — MIDAZOLAM HCL 2 MG/2ML IJ SOLN
1.0000 mg | INTRAMUSCULAR | Status: DC
  Administered 2017-06-21: 1 mg via INTRAVENOUS
  Filled 2017-06-21: qty 2

## 2017-06-21 MED ORDER — MEPERIDINE HCL 50 MG/ML IJ SOLN
6.2500 mg | INTRAMUSCULAR | Status: DC | PRN
  Administered 2017-06-21: 6.25 mg via INTRAVENOUS

## 2017-06-21 MED ORDER — SODIUM CHLORIDE 0.9 % IJ SOLN
INTRAMUSCULAR | Status: AC
  Filled 2017-06-21: qty 50

## 2017-06-21 MED ORDER — SODIUM CHLORIDE 0.9 % IJ SOLN
INTRAMUSCULAR | Status: DC | PRN
  Administered 2017-06-21: 60 mL

## 2017-06-21 MED ORDER — GABAPENTIN 300 MG PO CAPS: 300.0000 mg | ORAL_CAPSULE | Freq: Three times a day (TID) | ORAL | 0 refills | Status: DC

## 2017-06-21 MED ORDER — 0.9 % SODIUM CHLORIDE (POUR BTL) OPTIME
TOPICAL | Status: DC | PRN
  Administered 2017-06-21: 1000 mL

## 2017-06-21 MED ORDER — METOCLOPRAMIDE HCL 5 MG/ML IJ SOLN: 5.0000 mg | Freq: Three times a day (TID) | INTRAMUSCULAR | Status: DC | PRN

## 2017-06-21 MED ORDER — SCOPOLAMINE 1 MG/3DAYS TD PT72
MEDICATED_PATCH | TRANSDERMAL | Status: AC
  Filled 2017-06-21: qty 1

## 2017-06-21 MED ORDER — CHLORHEXIDINE GLUCONATE 4 % EX LIQD: 60.0000 mL | Freq: Once | CUTANEOUS | Status: DC

## 2017-06-21 MED ORDER — SODIUM CHLORIDE 0.9 % IR SOLN
Status: DC | PRN
  Administered 2017-06-21: 1000 mL

## 2017-06-21 MED ORDER — RIVAROXABAN 10 MG PO TABS
10.0000 mg | ORAL_TABLET | Freq: Every day | ORAL | Status: DC
  Administered 2017-06-22 – 2017-06-25 (×4): 10 mg via ORAL
  Filled 2017-06-21 (×4): qty 1

## 2017-06-21 MED ORDER — METHOCARBAMOL 500 MG PO TABS
500.0000 mg | ORAL_TABLET | Freq: Four times a day (QID) | ORAL | Status: DC | PRN
  Administered 2017-06-21 – 2017-06-25 (×11): 500 mg via ORAL
  Filled 2017-06-21 (×11): qty 1

## 2017-06-21 MED ORDER — DOCUSATE SODIUM 100 MG PO CAPS
100.0000 mg | ORAL_CAPSULE | Freq: Two times a day (BID) | ORAL | Status: DC
  Administered 2017-06-21 – 2017-06-25 (×8): 100 mg via ORAL
  Filled 2017-06-21 (×8): qty 1

## 2017-06-21 MED ORDER — DEXAMETHASONE SODIUM PHOSPHATE 10 MG/ML IJ SOLN
10.0000 mg | Freq: Once | INTRAMUSCULAR | Status: AC
  Administered 2017-06-22: 10 mg via INTRAVENOUS
  Filled 2017-06-21: qty 1

## 2017-06-21 MED ORDER — FLEET ENEMA 7-19 GM/118ML RE ENEM: 1.0000 | ENEMA | Freq: Once | RECTAL | Status: DC | PRN

## 2017-06-21 MED ORDER — AMLODIPINE BESYLATE 5 MG PO TABS
5.0000 mg | ORAL_TABLET | Freq: Every day | ORAL | Status: DC
  Administered 2017-06-21 – 2017-06-24 (×4): 5 mg via ORAL
  Filled 2017-06-21 (×4): qty 1

## 2017-06-21 MED ORDER — PRAVASTATIN SODIUM 20 MG PO TABS
20.0000 mg | ORAL_TABLET | Freq: Every day | ORAL | Status: DC
  Administered 2017-06-21 – 2017-06-24 (×4): 20 mg via ORAL
  Filled 2017-06-21 (×4): qty 1

## 2017-06-21 MED ORDER — ACETAMINOPHEN 500 MG PO TABS
1000.0000 mg | ORAL_TABLET | Freq: Four times a day (QID) | ORAL | Status: AC
  Administered 2017-06-21 – 2017-06-22 (×4): 1000 mg via ORAL
  Filled 2017-06-21 (×4): qty 2

## 2017-06-21 MED ORDER — VANCOMYCIN HCL IN DEXTROSE 1-5 GM/200ML-% IV SOLN
1000.0000 mg | Freq: Two times a day (BID) | INTRAVENOUS | Status: AC
  Administered 2017-06-21: 1000 mg via INTRAVENOUS
  Filled 2017-06-21: qty 200

## 2017-06-21 SURGICAL SUPPLY — 62 items
BAG DECANTER FOR FLEXI CONT (MISCELLANEOUS) IMPLANT
BAG SPEC THK2 15X12 ZIP CLS (MISCELLANEOUS) ×1
BAG ZIPLOCK 12X15 (MISCELLANEOUS) ×2 IMPLANT
BANDAGE ACE 6X5 VEL STRL LF (GAUZE/BANDAGES/DRESSINGS) ×2 IMPLANT
BIT DRILL 2.9X70 QC CALB (BIT) ×2 IMPLANT
BLADE SAG 18X100X1.27 (BLADE) ×2 IMPLANT
BLADE SAW SGTL 11.0X1.19X90.0M (BLADE) ×2 IMPLANT
BOWL SMART MIX CTS (DISPOSABLE) ×6 IMPLANT
CAPT KNEE TOTAL 3 ATTUNE ×2 IMPLANT
CATH FOLEY 2WAY SLVR  5CC 24FR (CATHETERS) ×1
CATH FOLEY 2WAY SLVR 5CC 24FR (CATHETERS) ×1 IMPLANT
CEMENT HV SMART SET (Cement) ×2 IMPLANT
CLEANER TIP ELECTROSURG 2X2 (MISCELLANEOUS) ×2 IMPLANT
COVER SURGICAL LIGHT HANDLE (MISCELLANEOUS) ×2 IMPLANT
CUFF TOURN SGL QUICK 34 (TOURNIQUET CUFF) ×1
CUFF TRNQT CYL 34X4X40X1 (TOURNIQUET CUFF) ×1 IMPLANT
DECANTER SPIKE VIAL GLASS SM (MISCELLANEOUS) ×2 IMPLANT
DRAPE TOP 10253 STERILE (DRAPES) IMPLANT
DRAPE U-SHAPE 47X51 STRL (DRAPES) ×2 IMPLANT
DRSG ADAPTIC 3X8 NADH LF (GAUZE/BANDAGES/DRESSINGS) ×2 IMPLANT
DRSG PAD ABDOMINAL 8X10 ST (GAUZE/BANDAGES/DRESSINGS) IMPLANT
DURAPREP 26ML APPLICATOR (WOUND CARE) ×2 IMPLANT
ELECT REM PT RETURN 15FT ADLT (MISCELLANEOUS) ×2 IMPLANT
EVACUATOR 1/8 PVC DRAIN (DRAIN) ×2 IMPLANT
GAUZE SPONGE 4X4 12PLY STRL (GAUZE/BANDAGES/DRESSINGS) ×2 IMPLANT
GLOVE BIO SURGEON STRL SZ7.5 (GLOVE) IMPLANT
GLOVE BIO SURGEON STRL SZ8 (GLOVE) ×2 IMPLANT
GLOVE BIOGEL PI IND STRL 6.5 (GLOVE) ×1 IMPLANT
GLOVE BIOGEL PI IND STRL 8 (GLOVE) ×1 IMPLANT
GLOVE BIOGEL PI INDICATOR 6.5 (GLOVE) ×1
GLOVE BIOGEL PI INDICATOR 8 (GLOVE) ×1
GLOVE SURG SS PI 6.5 STRL IVOR (GLOVE) ×2 IMPLANT
GOWN STRL REUS W/TWL LRG LVL3 (GOWN DISPOSABLE) ×4 IMPLANT
GOWN STRL REUS W/TWL XL LVL3 (GOWN DISPOSABLE) IMPLANT
HANDPIECE INTERPULSE COAX TIP (DISPOSABLE) ×1
HOLDER FOLEY CATH W/STRAP (MISCELLANEOUS) ×2 IMPLANT
IMMOBILIZER KNEE 20 (SOFTGOODS) ×2 IMPLANT
IMMOBILIZER KNEE 20 THIGH 36 (SOFTGOODS) IMPLANT
MANIFOLD NEPTUNE II (INSTRUMENTS) ×2 IMPLANT
NS IRRIG 1000ML POUR BTL (IV SOLUTION) ×2 IMPLANT
PACK TOTAL KNEE CUSTOM (KITS) ×2 IMPLANT
PAD ABD 8X10 STRL (GAUZE/BANDAGES/DRESSINGS) ×2 IMPLANT
PADDING CAST COTTON 6X4 STRL (CAST SUPPLIES) ×4 IMPLANT
POSITIONER SURGICAL ARM (MISCELLANEOUS) ×2 IMPLANT
SCREW CORTICAL 3.5MM  55MM (Screw) ×1 IMPLANT
SCREW CORTICAL 3.5MM  60MM (Screw) ×1 IMPLANT
SCREW CORTICAL 3.5MM 55MM (Screw) ×1 IMPLANT
SCREW CORTICAL 3.5MM 60MM (Screw) ×1 IMPLANT
SCREW CORTICAL 3.5MM 65MM (Screw) ×2 IMPLANT
SET HNDPC FAN SPRY TIP SCT (DISPOSABLE) ×1 IMPLANT
STRIP CLOSURE SKIN 1/2X4 (GAUZE/BANDAGES/DRESSINGS) ×4 IMPLANT
SUT MNCRL AB 4-0 PS2 18 (SUTURE) ×2 IMPLANT
SUT STRATAFIX 0 PDS 27 VIOLET (SUTURE) ×2
SUT VIC AB 2-0 CT1 27 (SUTURE) ×3
SUT VIC AB 2-0 CT1 TAPERPNT 27 (SUTURE) ×3 IMPLANT
SUTURE STRATFX 0 PDS 27 VIOLET (SUTURE) ×1 IMPLANT
SYR 50ML LL SCALE MARK (SYRINGE) ×2 IMPLANT
TRAY FOLEY W/METER SILVER 16FR (SET/KITS/TRAYS/PACK) ×2 IMPLANT
WASHER CUP TIMAX (Trauma) ×2 IMPLANT
WATER STERILE IRR 1000ML POUR (IV SOLUTION) ×4 IMPLANT
WRAP KNEE MAXI GEL POST OP (GAUZE/BANDAGES/DRESSINGS) ×2 IMPLANT
YANKAUER SUCT BULB TIP 10FT TU (MISCELLANEOUS) ×2 IMPLANT

## 2017-06-21 NOTE — Interval H&P Note (Signed)
History and Physical Interval Note:  06/21/2017 8:32 AM  Jade Cardenas  has presented today for surgery, with the diagnosis of Osteoarthritis Left Knee  The various methods of treatment have been discussed with the patient and family. After consideration of risks, benefits and other options for treatment, the patient has consented to  Procedure(s): LEFT TOTAL KNEE ARTHROPLASTY (Left) as a surgical intervention .  The patient's history has been reviewed, patient examined, no change in status, stable for surgery.  I have reviewed the patient's chart and labs.  Questions were answered to the patient's satisfaction.     [name]

## 2017-06-21 NOTE — Anesthesia Postprocedure Evaluation (Signed)
Anesthesia Post Note  Patient: Jade Cardenas  Procedure(s) Performed: LEFT TOTAL KNEE ARTHROPLASTY AND RIGHT KNEE CORTISONE INJECTION (N/A )     Patient location during evaluation: PACU Anesthesia Type: General and Regional Level of consciousness: awake and alert Pain management: pain level controlled Vital Signs Assessment: post-procedure vital signs reviewed and stable Respiratory status: spontaneous breathing, nonlabored ventilation, respiratory function stable and patient connected to nasal cannula oxygen Cardiovascular status: blood pressure returned to baseline and stable Postop Assessment: no apparent nausea or vomiting Anesthetic complications: no    Last Vitals:  Vitals:   06/21/17 1330 06/21/17 1345  BP: (!) 145/66 (!) 151/80  Pulse: 70 80  Resp: 13 15  Temp: 36.9 C   SpO2: 100% 100%    Last Pain:  Vitals:   06/21/17 1345  TempSrc:   PainSc: 9                  Iver Fehrenbach,W. EDMOND

## 2017-06-21 NOTE — Op Note (Signed)
OPERATIVE REPORT-TOTAL KNEE ARTHROPLASTY   Pre-operative diagnosis- Osteoarthritis  Bilateral knee(s)  Post-operative diagnosis- Osteoarthritis Bilateral knee(s)  Procedure-  Left  Total Knee Arthroplasty   Right knee cortisone injection  Surgeon- Dione Plover. Rieley Khalsa, MD  Assistant- Ardeen Jourdain, PA-C   Anesthesia-  GA combined with regional for post-op pain  EBL-25 mL   Drains Hemovac  Tourniquet time-  Total Tourniquet Time Documented: Thigh (Left) - 72 minutes Total: Thigh (Left) - 72 minutes     Complications- Medial femoral condyle fracture  Condition-PACU - hemodynamically stable.   Brief Clinical Note  Jade Cardenas is a 67 y.o. year old female with end stage OA of her left knee with progressively worsening pain and dysfunction. She has constant pain, with activity and at rest and significant functional deficits with difficulties even with ADLs. She has had extensive non-op management including analgesics, injections of cortisone and viscosupplements, and home exercise program, but remains in significant pain with significant dysfunction. Radiographs show bone on bone arthritis medial and patellofemoral. She presents now for left Total Knee Arthroplasty. She also has osteoarthritis of her right knee and requests a cortisone injection in that knee.  Procedure in detail---   The patient is brought into the operating room and positioned supine on the operating table. After successful administration of  GA combined with regional for post-op pain,   a tourniquet is placed high on the  Left thigh(s) and the lower extremity is prepped and draped in the usual sterile fashion. Time out is performed by the operating team and then the  Left lower extremity is wrapped in Esmarch, knee flexed and the tourniquet inflated to 300 mmHg.       A midline incision is made with a ten blade through the subcutaneous tissue to the level of the extensor mechanism. A fresh blade is used to  make a medial parapatellar arthrotomy. Soft tissue over the proximal medial tibia is subperiosteally elevated to the joint line with a knife and into the semimembranosus bursa with a Cobb elevator. Soft tissue over the proximal lateral tibia is elevated with attention being paid to avoiding the patellar tendon on the tibial tubercle. The patella is everted, knee flexed 90 degrees and the ACL and PCL are removed. Findings are bone on bone medial with significant medial and patellofemoral spurring.        The drill is used to create a starting hole in the distal femur and the canal is thoroughly irrigated with sterile saline to remove the fatty contents. The 5 degree Left  valgus alignment guide is placed into the femoral canal and the distal femoral cutting block is pinned to remove 9 mm off the distal femur. Resection is made with an oscillating saw.      The tibia is subluxed forward and the menisci are removed. The extramedullary alignment guide is placed referencing proximally at the medial aspect of the tibial tubercle and distally along the second metatarsal axis and tibial crest. The block is pinned to remove 15mm off the more deficient medial  side. Resection is made with an oscillating saw. Size 5is the most appropriate size for the tibia and the proximal tibia is prepared with the modular drill and keel punch for that size.      The femoral sizing guide is placed and size 6 is most appropriate. Rotation is marked off the epicondylar axis and confirmed by creating a rectangular flexion gap at 90 degrees. The size 6 cutting block is pinned in  this rotation and the anterior, posterior and chamfer cuts are made with the oscillating saw. The intercondylar block is then placed and that cut is made.      Trial size 5 tibial component, trial size 6 posterior stabilized femur and a 8  mm posterior stabilized rotating platform insert trial is placed. Full extension is achieved with excellent varus/valgus and  anterior/posterior balance throughout full range of motion. The patella is everted and thickness measured to be 22  mm. Free hand resection is taken to 12 mm, a 35 template is placed, lug holes are drilled, trial patella is placed, and it tracks normally. Osteophytes are removed off the posterior femur with the trial in place. All trials are removed and the cut bone surfaces prepared with pulsatile lavage. At this point it is noted that there is a fracture of the medial femoral condyle. I felt that this would be potentially unstable thus decided that it needed to be fixed. It is held reduced with a fracture reduction clamp then 3 2.0 Steinmann pins are place across the fracture into the lateral cortex of the femur, which maintained anatomic reduction. I then placed a 3.5 mm cortical screw transversely from medial to lateral to secure the fixation further. Cement is then mixed and once ready for implantation, the size 5 tibial implant, size  6 posterior stabilized femoral component, and the size 32 patella are cemented in place and the patella is held with the clamp. The trial insert is placed and the knee held in full extension. The Exparel (20 ml mixed with 60 ml saline) is injected into the extensor mechanism, posterior capsule, medial and lateral gutters and subcutaneous tissues.  All extruded cement is removed and once the cement is hard the permanent 8 mm posterior stabilized rotating platform insert is placed into the tibial tray. The knee is placed through a range of motion and stressed in multiple planes demonstrating stable reduction of the fracture.      The wound is copiously irrigated with saline solution and the extensor mechanism closed over a hemovac drain with #1 V-loc suture. The tourniquet is released for a total tourniquet time of 72  minutes. Flexion against gravity is 140 degrees and the patella tracks normally. Subcutaneous tissue is closed with 2.0 vicryl and subcuticular with running 4.0  Monocryl. The incision is cleaned and dried and steri-strips and a bulky sterile dressing are applied. The limb is placed into a knee immobilizer.      I then prepped the right knee with Betadine and injected the joint with 80 mg (2 ml) Depomedrol with no problems. The patient is then awakened and transported to recovery in stable condition.      Please note that a surgical assistant was a medical necessity for this procedure in order to perform it in a safe and expeditious manner. Surgical assistant was necessary to retract the ligaments and vital neurovascular structures to prevent injury to them and also necessary for proper positioning of the limb to allow for anatomic placement of the prosthesis.   Dione Plover Jubilee Vivero, MD    06/21/2017, 5:06 PM

## 2017-06-21 NOTE — Discharge Instructions (Addendum)
° °Dr. Frank Aluisio °Total Joint Specialist °[facility] °[address] °[place], [place] [address] °[phone] ° °TOTAL KNEE REPLACEMENT POSTOPERATIVE DIRECTIONS ° °Knee Rehabilitation, Guidelines Following Surgery  °Results after knee surgery are often greatly improved when you follow the exercise, range of motion and muscle strengthening exercises prescribed by your doctor. Safety measures are also important to protect the knee from further injury. Any time any of these exercises cause you to have increased pain or swelling in your knee joint, decrease the amount until you are comfortable again and slowly increase them. If you have problems or questions, call your caregiver or physical therapist for advice.  ° °HOME CARE INSTRUCTIONS  °Remove items at home which could result in a fall. This includes throw rugs or furniture in walking pathways.  °· ICE to the affected knee every three hours for 30 minutes at a time and then as needed for pain and swelling.  Continue to use ice on the knee for pain and swelling from surgery. You may notice swelling that will progress down to the foot and ankle.  This is normal after surgery.  Elevate the leg when you are not up walking on it.   °· Continue to use the breathing machine which will help keep your temperature down.  It is common for your temperature to cycle up and down following surgery, especially at night when you are not up moving around and exerting yourself.  The breathing machine keeps your lungs expanded and your temperature down. °· Do not place pillow under knee, focus on keeping the knee straight while resting ° °DIET °You may resume your previous home diet once your are discharged from the hospital. ° °DRESSING / WOUND CARE / SHOWERING °You may change your dressing every day with sterile gauze.  Please use good hand washing techniques before changing the dressing.  Do not use any lotions or creams on the incision until instructed by your  surgeon. °You may start showering once you are discharged home but do not submerge the incision under water. Just pat the incision dry and apply a dry gauze dressing on daily. °Change the surgical dressing daily and reapply a dry dressing each time. ° °ACTIVITY °Walk with your walker as instructed. °Use walker as long as suggested by your caregivers. °Avoid periods of inactivity such as sitting longer than an hour when not asleep. This helps prevent blood clots.  °You may resume a sexual relationship in one month or when given the OK by your doctor.  °You may return to work once you are cleared by your doctor.  °Do not drive a car for 6 weeks or until released by you surgeon.  °Do not drive while taking narcotics. ° °WEIGHT BEARING °Weight bearing as tolerated with assist device (walker, cane, etc) as directed, use it as long as suggested by your surgeon or therapist, typically at least 4-6 weeks. ° °POSTOPERATIVE CONSTIPATION PROTOCOL °Constipation - defined medically as fewer than three stools per week and severe constipation as less than one stool per week. ° °One of the most common issues patients have following surgery is constipation.  Even if you have a regular bowel pattern at home, your normal regimen is likely to be disrupted due to multiple reasons following surgery.  Combination of anesthesia, postoperative narcotics, change in appetite and fluid intake all can affect your bowels.  In order to avoid complications following surgery, here are some recommendations in order to help you during your recovery period. ° °  Colace (docusate) - Pick up an over-the-counter form of Colace or another stool softener and take twice a day as long as you are requiring postoperative pain medications.  Take with a full glass of water daily.  If you experience loose stools or diarrhea, hold the colace until you stool forms back up.  If your symptoms do not get better within 1 week or if they get worse, check with your  doctor.  Dulcolax (bisacodyl) - Pick up over-the-counter and take as directed by the product packaging as needed to assist with the movement of your bowels.  Take with a full glass of water.  Use this product as needed if not relieved by Colace only.   MiraLax (polyethylene glycol) - Pick up over-the-counter to have on hand.  MiraLax is a solution that will increase the amount of water in your bowels to assist with bowel movements.  Take as directed and can mix with a glass of water, juice, soda, coffee, or tea.  Take if you go more than two days without a movement. Do not use MiraLax more than once per day. Call your doctor if you are still constipated or irregular after using this medication for 7 days in a row.  If you continue to have problems with postoperative constipation, please contact the office for further assistance and recommendations.  If you experience "the worst abdominal pain ever" or develop nausea or vomiting, please contact the office immediatly for further recommendations for treatment.  ITCHING  If you experience itching with your medications, try taking only a single pain pill, or even half a pain pill at a time.  You can also use Benadryl over the counter for itching or also to help with sleep.   TED HOSE STOCKINGS Wear the elastic stockings on both legs for three weeks following surgery during the day but you may remove then at night for sleeping.  MEDICATIONS See your medication summary on the After Visit Summary that the nursing staff will review with you prior to discharge.  You may have some home medications which will be placed on hold until you complete the course of blood thinner medication.  It is important for you to complete the blood thinner medication as prescribed by your surgeon.  Continue your approved medications as instructed at time of discharge.  Take Xarelto for two and a half more weeks following discharge from the hospital, then discontinue  Xarelto. Once the patient has completed the blood thinner regimen, then take a Baby 81 mg Aspirin daily for three more weeks.  Gabapentin 300 mg Protocol Take a 300 mg capsule three times a day for two weeks, Then a 300 mg capsule twice a day for two weeks, Then a 300 mg capsule once a day for two weeks, then discontinue the Gabapentin.  PRECAUTIONS If you experience chest pain or shortness of breath - call 911 immediately for transfer to the hospital emergency department.  If you develop a fever greater that 101 F, purulent drainage from wound, increased redness or drainage from wound, foul odor from the wound/dressing, or calf pain - CONTACT YOUR SURGEON.                                                   FOLLOW-UP APPOINTMENTS Make sure you keep all of your appointments after your operation with your  surgeon and caregivers. You should call the office at the above phone number and make an appointment for approximately two weeks after the date of your surgery or on the date instructed by your surgeon outlined in the "After Visit Summary".   RANGE OF MOTION AND STRENGTHENING EXERCISES  Rehabilitation of the knee is important following a knee injury or an operation. After just a few days of immobilization, the muscles of the thigh which control the knee become weakened and shrink (atrophy). Knee exercises are designed to build up the tone and strength of the thigh muscles and to improve knee motion. Often times heat used for twenty to thirty minutes before working out will loosen up your tissues and help with improving the range of motion but do not use heat for the first two weeks following surgery. These exercises can be done on a training (exercise) mat, on the floor, on a table or on a bed. Use what ever works the best and is most comfortable for you Knee exercises include:  Leg Lifts - While your knee is still immobilized in a splint or cast, you can do straight leg raises. Lift the leg to 60  degrees, hold for 3 sec, and slowly lower the leg. Repeat 10-20 times 2-3 times daily. Perform this exercise against resistance later as your knee gets better.  Quad and Hamstring Sets - Tighten up the muscle on the front of the thigh (Quad) and hold for 5-10 sec. Repeat this 10-20 times hourly. Hamstring sets are done by pushing the foot backward against an object and holding for 5-10 sec. Repeat as with quad sets.   Leg Slides: Lying on your back, slowly slide your foot toward your buttocks, bending your knee up off the floor (only go as far as is comfortable). Then slowly slide your foot back down until your leg is flat on the floor again.  Angel Wings: Lying on your back spread your legs to the side as far apart as you can without causing discomfort.  A rehabilitation program following serious knee injuries can speed recovery and prevent re-injury in the future due to weakened muscles. Contact your doctor or a physical therapist for more information on knee rehabilitation.   IF YOU ARE TRANSFERRED TO A SKILLED REHAB FACILITY If the patient is transferred to a skilled rehab facility following release from the hospital, a list of the current medications will be sent to the facility for the patient to continue.  When discharged from the skilled rehab facility, please have the facility set up the patient's [place] prior to being released. Also, the skilled facility will be responsible for providing the patient with their medications at time of release from the facility to include their pain medication, the muscle relaxants, and their blood thinner medication. If the patient is still at the rehab facility at time of the two week follow up appointment, the skilled rehab facility will also need to assist the patient in arranging follow up appointment in our office and any transportation needs.  MAKE SURE YOU:  Understand these instructions.  Get help right away if you are not doing  well or get worse.    Pick up stool softner and laxative for home use following surgery while on pain medications. Do not submerge incision under water. Please use good hand washing techniques while changing dressing each day. May shower starting three days after surgery. Please use a clean towel to pat the incision dry following showers. Continue  to use ice for pain and swelling after surgery. Do not use any lotions or creams on the incision until instructed by your surgeon.  Information on my medicine - XARELTO (Rivaroxaban)  Why was Xarelto prescribed for you? Xarelto was prescribed for you to reduce the risk of blood clots forming after orthopedic surgery. The medical term for these abnormal blood clots is venous thromboembolism (VTE).  What do you need to know about xarelto ? Take your Xarelto ONCE DAILY at the same time every day. You may take it either with or without food.  If you have difficulty swallowing the tablet whole, you may crush it and mix in applesauce just prior to taking your dose.  Take Xarelto exactly as prescribed by your doctor and DO NOT stop taking Xarelto without talking to the doctor who prescribed the medication.  Stopping without other VTE prevention medication to take the place of Xarelto may increase your risk of developing a clot.  After discharge, you should have regular check-up appointments with your healthcare provider that is prescribing your Xarelto.    What do you do if you miss a dose? If you miss a dose, take it as soon as you remember on the same day then continue your regularly scheduled once daily regimen the next day. Do not take two doses of Xarelto on the same day.   Important Safety Information A possible side effect of Xarelto is bleeding. You should call your healthcare provider right away if you experience any of the following: ? Bleeding from an injury or your nose that does not stop. ? Unusual colored urine (red or dark  brown) or unusual colored stools (red or black). ? Unusual bruising for unknown reasons. ? A serious fall or if you hit your head (even if there is no bleeding).  Some medicines may interact with Xarelto and might increase your risk of bleeding while on Xarelto. To help avoid this, consult your healthcare provider or pharmacist prior to using any new prescription or non-prescription medications, including herbals, vitamins, non-steroidal anti-inflammatory drugs (NSAIDs) and supplements.  This website has more information on Xarelto: [email or web address]

## 2017-06-21 NOTE — Transfer of Care (Signed)
Immediate Anesthesia Transfer of Care Note  Patient: Jade Cardenas  Procedure(s) Performed: LEFT TOTAL KNEE ARTHROPLASTY AND RIGHT KNEE CORTISONE INJECTION (N/A )  Patient Location: PACU  Anesthesia Type:General  Level of Consciousness: sedated, patient cooperative and responds to stimulation  Airway & Oxygen Therapy: Patient Spontanous Breathing and Patient connected to face mask oxygen  Post-op Assessment: Report given to RN and Post -op Vital signs reviewed and stable  Post vital signs: Reviewed and stable  Last Vitals:  Vitals Value Taken Time  BP 159/77 06/21/2017 12:40 PM  Temp    Pulse 84 06/21/2017 12:41 PM  Resp 13 06/21/2017 12:41 PM  SpO2 100 % 06/21/2017 12:41 PM  Vitals shown include unvalidated device data.  Last Pain:  Vitals:   06/21/17 1002  TempSrc:   PainSc: 0-No pain      Patients Stated Pain Goal: 5 (46/50/35 [id number])  Complications: No apparent anesthesia complications

## 2017-06-21 NOTE — Anesthesia Procedure Notes (Signed)
Anesthesia Regional Block: Adductor canal block   Pre-Anesthetic Checklist: ,, timeout performed, Correct Patient, Correct Site, Correct Laterality, Correct Procedure, Correct Position, site marked, Risks and benefits discussed, pre-op evaluation,  At surgeon's request and post-op pain management  Laterality: Left  Prep: Maximum Sterile Barrier Precautions used, chloraprep       Needles:  Injection technique: Single-shot  Needle Type: Echogenic Stimulator Needle     Needle Length: 9cm  Needle Gauge: 21     Additional Needles:   Procedures:,,,, ultrasound used (permanent image in chart),,,,  Narrative:  Start time: 06/21/2017 9:44 AM End time: 06/21/2017 9:54 AM Injection made incrementally with aspirations every 5 mL.  Performed by: Personally  Anesthesiologist: Roderic Palau, MD  Additional Notes: 2% Lidocaine skin wheel.

## 2017-06-21 NOTE — Anesthesia Procedure Notes (Signed)
Procedure Name: LMA Insertion Performed by: Dezzie Badilla J, CRNA Pre-anesthesia Checklist: Patient identified, Emergency Drugs available, Suction available, Patient being monitored and Timeout performed Patient Re-evaluated:Patient Re-evaluated prior to induction Oxygen Delivery Method: Circle system utilized Preoxygenation: Pre-oxygenation with 100% oxygen Induction Type: IV induction Ventilation: Mask ventilation without difficulty LMA: LMA inserted LMA Size: 4.0 Number of attempts: 1 Placement Confirmation: positive ETCO2,  CO2 detector and breath sounds checked- equal and bilateral Tube secured with: Tape Dental Injury: Teeth and Oropharynx as per pre-operative assessment        

## 2017-06-21 NOTE — Anesthesia Preprocedure Evaluation (Signed)
Anesthesia Evaluation  Patient identified by MRN, date of birth, ID band Patient awake    Reviewed: Allergy & Precautions, H&P , NPO status , Patient's Chart, lab work & pertinent test results  History of Anesthesia Complications (+) PONV  Airway Mallampati: II  TM Distance: >3 FB Neck ROM: Full    Dental no notable dental hx. (+) Teeth Intact, Dental Advisory Given   Pulmonary neg pulmonary ROS,    Pulmonary exam normal breath sounds clear to auscultation       Cardiovascular hypertension, + Peripheral Vascular Disease   Rhythm:Regular Rate:Normal     Neuro/Psych  Headaches, negative psych ROS   GI/Hepatic Neg liver ROS, GERD  Medicated and Controlled,  Endo/Other  negative endocrine ROS  Renal/GU negative Renal ROS  negative genitourinary   Musculoskeletal  (+) Arthritis , Rheumatoid disorders,  Fibromyalgia -  Abdominal   Peds  Hematology negative hematology ROS (+) anemia ,   Anesthesia Other Findings   Reproductive/Obstetrics negative OB ROS                             Anesthesia Physical Anesthesia Plan  ASA: II  Anesthesia Plan: General   Post-op Pain Management:  Regional for Post-op pain   Induction: Intravenous  PONV Risk Score and Plan: 4 or greater and Ondansetron, Dexamethasone, Scopolamine patch - Pre-op and Midazolam  Airway Management Planned: Oral ETT and LMA  Additional Equipment:   Intra-op Plan:   Post-operative Plan: Extubation in OR  Informed Consent: I have reviewed the patients History and Physical, chart, labs and discussed the procedure including the risks, benefits and alternatives for the proposed anesthesia with the patient or authorized representative who has indicated his/her understanding and acceptance.   Dental advisory given  Plan Discussed with: CRNA  Anesthesia Plan Comments:         Anesthesia Quick Evaluation

## 2017-06-21 NOTE — Progress Notes (Signed)
Assisted Dr. Edmond Fitzgerald with left, ultrasound guided, adductor canal block. Side rails up, monitors on throughout procedure. See vital signs in flow sheet. Tolerated Procedure well. 

## 2017-06-21 NOTE — Evaluation (Signed)
Physical Therapy Evaluation Patient Details Name: Jade Cardenas MRN: [id number] DOB: October 29, 1950 Today's Date: 06/21/2017   History of Present Illness  Pt is a 67 year old female s/p L TKA  Clinical Impression  Pt is s/p TKA resulting in the deficits listed below (see PT Problem List).  Pt will benefit from skilled PT to increase their independence and safety with mobility to allow discharge to the venue listed below.  Pt assisted with ambulating in hallway short distance POD #0.  Pt plans to d/c home with support from friends and family and f/u with OP PT.     Follow Up Recommendations Outpatient PT;Follow surgeon's recommendation for DC plan and follow-up therapies    Equipment Recommendations  None recommended by PT    Recommendations for Other Services       Precautions / Restrictions Precautions Precautions: Fall;Knee Required Braces or Orthoses: Knee Immobilizer - Left Restrictions Weight Bearing Restrictions: No Other Position/Activity Restrictions: WBAT      Mobility  Bed Mobility Overal bed mobility: Needs Assistance Bed Mobility: Supine to Sit     Supine to sit: Min assist     General bed mobility comments: verbal cues for technique, assist for L LE  Transfers Overall transfer level: Needs assistance Equipment used: Rolling walker (2 wheeled) Transfers: Sit to/from Stand Sit to Stand: Min assist         General transfer comment: verbal cues for UE and LE positioning, assist to rise, steady, and control descent  Ambulation/Gait Ambulation/Gait assistance: Min assist Ambulation Distance (Feet): 9 Feet Assistive device: Rolling walker (2 wheeled) Gait Pattern/deviations: Step-to pattern;Decreased stance time - left;Antalgic     General Gait Details: verbal cues for sequence, RW positioning, posture, step length, distance limited due to "woozy" feeling per pt from pain meds, recliner brought behind pt  Stairs            Wheelchair Mobility    Modified Rankin (Stroke Patients Only)       Balance                                             Pertinent Vitals/Pain Pain Assessment: 0-10 Pain Score: 3  Pain Location: L knee Pain Descriptors / Indicators: Aching;Sore Pain Intervention(s): Limited activity within patient's tolerance;Repositioned;Monitored during session;Ice applied    Home Living Family/patient expects to be discharged to:: Private residence Living Arrangements: Alone Available Help at Discharge: Family;Friend(s);Available 24 hours/day Type of Home: House(town home) Home Access: Stairs to enter Entrance Stairs-Rails: None Entrance Stairs-Number of Steps: 1 Home Layout: One level Home Equipment: Cane - single point;Walker - 4 wheels;Walker - 2 wheels      Prior Function Level of Independence: Independent               Hand Dominance        Extremity/Trunk Assessment        Lower Extremity Assessment Lower Extremity Assessment: LLE deficits/detail LLE Deficits / Details: unable to perform SLR, fair quad contraction, able to perform ankle pumps, ROM TBA       Communication   Communication: No difficulties  Cognition Arousal/Alertness: Awake/alert Behavior During Therapy: WFL for tasks assessed/performed Overall Cognitive Status: Within Functional Limits for tasks assessed  General Comments      Exercises     Assessment/Plan    PT Assessment Patient needs continued PT services  PT Problem List Decreased strength;Decreased mobility;Decreased range of motion;Decreased knowledge of use of DME;Decreased knowledge of precautions;Pain       PT Treatment Interventions Stair training;Functional mobility training;Gait training;Therapeutic exercise;Patient/family education;DME instruction;Therapeutic activities    PT Goals (Current goals can be found in the Care Plan section)  Acute Rehab PT Goals Patient  Stated Goal: hopeful for OP PT by Friday PT Goal Formulation: With patient Time For Goal Achievement: 06/26/17 Potential to Achieve Goals: Good    Frequency 7X/week   Barriers to discharge        Co-evaluation               AM-PAC PT "6 Clicks" Daily Activity  Outcome Measure Difficulty turning over in bed (including adjusting bedclothes, sheets and blankets)?: A Little Difficulty moving from lying on back to sitting on the side of the bed? : Unable Difficulty sitting down on and standing up from a chair with arms (e.g., wheelchair, bedside commode, etc,.)?: Unable Help needed moving to and from a bed to chair (including a wheelchair)?: A Little Help needed walking in hospital room?: A Little Help needed climbing 3-5 steps with a railing? : A Lot 6 Click Score: 13    End of Session Equipment Utilized During Treatment: Gait belt;Left knee immobilizer Activity Tolerance: Patient tolerated treatment well Patient left: in chair;with chair alarm set;with call bell/phone within reach;with family/visitor present Nurse Communication: Mobility status PT Visit Diagnosis: Other abnormalities of gait and mobility (R26.89)    Time: 5053-9767 PT Time Calculation (min) (ACUTE ONLY): 25 min   Charges:   PT Evaluation $PT Eval Low Complexity: 1 Low     PT G CodesCarmelia Bake, PT, DPT 06/21/2017 Pager: [phone]  York Ram E 06/21/2017, 5:27 PM

## 2017-06-22 LAB — BASIC METABOLIC PANEL
Anion gap: 11 (ref 5–15)
BUN: 11 mg/dL (ref 6–20)
CO2: 26 mmol/L (ref 22–32)
Calcium: 9.5 mg/dL (ref 8.9–10.3)
Chloride: 106 mmol/L (ref 101–111)
Creatinine, Ser: 0.6 mg/dL (ref 0.44–1.00)
GFR calc Af Amer: >60 mL/min (ref >60)
GFR calc non Af Amer: >60 mL/min (ref >60)
Glucose, Bld: 148 mg/dL — ABNORMAL HIGH (ref 65–99)
Potassium: 4.5 mmol/L (ref 3.5–5.1)
Sodium: 143 mmol/L (ref 135–145)

## 2017-06-22 LAB — CBC
HCT: 36.4 % (ref 36.0–46.0)
Hemoglobin: 12.7 g/dL (ref 12.0–15.0)
MCH: 33.7 pg (ref 26.0–34.0)
MCHC: 34.9 g/dL (ref 30.0–36.0)
MCV: 96.6 fL (ref 78.0–100.0)
Platelets: 315 10*3/uL (ref 150–400)
RBC: 3.77 MIL/uL — ABNORMAL LOW (ref 3.87–5.11)
RDW: 13.2 % (ref 11.5–15.5)
WBC: 12 10*3/uL — ABNORMAL HIGH (ref 4.0–10.5)

## 2017-06-22 MED ORDER — HYDROMORPHONE HCL 1 MG/ML IJ SOLN
0.5000 mg | INTRAMUSCULAR | Status: DC | PRN
  Administered 2017-06-22: 1 mg via INTRAVENOUS
  Administered 2017-06-22: 0.5 mg via INTRAVENOUS
  Filled 2017-06-22 (×2): qty 1

## 2017-06-22 MED ORDER — OXYCODONE HCL 5 MG PO TABS: 5.0000 mg | ORAL_TABLET | Freq: Four times a day (QID) | ORAL | 0 refills | Status: DC | PRN

## 2017-06-22 NOTE — Progress Notes (Signed)
Subjective: 1 Day Post-Op Procedure(s) (LRB): LEFT TOTAL KNEE ARTHROPLASTY AND RIGHT KNEE CORTISONE INJECTION (N/A) Patient reports pain as mild.   Patient seen in rounds with Dr. Wynelle Link. Patient is well, and has had no acute complaints or problems. No issues overnight. We will start therapy today.  Plan is to go Home after hospital stay.  Objective: Vital signs in last 24 hours: Temp:  [98 F (36.7 C)-98.8 F (37.1 C)] 98.3 F (36.8 C) (04/23 0523) Pulse Rate:  [70-97] 80 (04/23 0523) Resp:  [11-29] 17 (04/23 0523) BP: (100-166)/(46-94) 148/89 (04/23 0523) SpO2:  [99 %-100 %] 99 % (04/23 0148) Weight:  [96.2 kg (212 lb 2 oz)] 96.2 kg (212 lb 2 oz) (04/22 0804)  Intake/Output from previous day:  Intake/Output Summary (Last 24 hours) at 06/22/2017 0719 Last data filed at 06/22/2017 0600 Gross per 24 hour  Intake 5106.25 ml  Output 3200 ml  Net 1906.25 ml     Labs: Recent Labs    06/22/17 0555  HGB 12.7   Recent Labs    06/22/17 0555  WBC 12.0*  RBC 3.77*  HCT 36.4  PLT 315   Recent Labs    06/22/17 0555  NA 143  K 4.5  CL 106  CO2 26  BUN 11  CREATININE 0.60  GLUCOSE 148*  CALCIUM 9.5    EXAM General - Patient is Alert and Oriented Extremity - Neurologically intact Intact pulses distally Dorsiflexion/Plantar flexion intact Compartment soft Dressing - dressing C/D/I Motor Function - intact, moving foot and toes well on exam.  Hemovac pulled without difficulty.  Past Medical History:  Diagnosis Date  . Anemia    iron def  . Arthritis    rheumatoid and osteo now flt to be psoriateic   . Arthritis    psoreratic, osteoarthritis  . Broken toe   . Bruises easily   . CHOLECYSTECTOMY, HX OF   . Chronic back pain   . COLONIC POLYPS, HX OF 02/09/2007  . Cough, persistent 01/21/2012   poss from acei   . 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 Fibromyalgia    doesn't require meds  . GASTRIC ULCER, ACUTE, HEMORRHAGE, HX OF 02/09/2007  . GERD  (gastroesophageal reflux disease)    takes Nexium daily  . Goiter   . H/O hiatal hernia   . Headache(784.0)    occasionally  . History of bronchitis   . Hx of seasonal allergies    takes Claritin daily  . HYPERLIPIDEMIA 12/29/2006   takes Lovastatin nightly  . HYPERTENSION 12/29/2006   takes Enalapril daily  . IBS 04/28/2007  . Insomnia    related to pain;takes Flexeril and Tylenol PM nightly  . Joint pain   . Joint swelling   . Palpitations 02/09/2007  . PONV (postoperative nausea and vomiting)    NAUSEA  . REDUCTION MAMMOPLASTY, HX OF 02/09/2007  . Rheumatoid arthritis(714.0) 12/29/2006  . Right bundle branch block 02/09/2007  . S/P lumbar fusion 6 13 09/03/2011   L4 L5  posterior   . SYNCOPE 12/07/2007  . UNS ADVRS EFF OTH RX MEDICINAL\T\BIOLOGICAL SBSTNC 02/09/2007    Assessment/Plan: 1 Day Post-Op Procedure(s) (LRB): LEFT TOTAL KNEE ARTHROPLASTY AND RIGHT KNEE CORTISONE INJECTION (N/A) Active Problems:   OA (osteoarthritis) of knee  Estimated body mass index is 35.3 kg/m as calculated from the following:   Height as of this encounter: 5\' 5"  (1.651 m).   Weight as of this encounter: 96.2 kg (212 lb 2 oz). Advance diet  Up with therapy D/C IV fluids when tolerating POs well  DVT Prophylaxis - Xarelto Weight-Bearing as tolerated   D/C O2 and Pulse OX and try on Room Air  Will continue with therapy today. Plan for DC home tomorrow pending progress. Will start outpatient therapy upon DC home.   Ardeen Jourdain, PA-C Orthopaedic Surgery 06/22/2017, 7:19 AM

## 2017-06-22 NOTE — Progress Notes (Signed)
Spoke with patient at bedside. Confirmed plan for OP PT, already arranged. Has RW and 3n1. [phone] 

## 2017-06-22 NOTE — Progress Notes (Signed)
Physical Therapy Treatment Patient Details Name: Jade Cardenas MRN: [id number] DOB: [birth year] Today's Date: 06/22/2017    History of Present Illness Pt is a 67 year old female s/p L TKA    PT Comments    Pt performed LE exercises and ambulated again in hallway.  Pt assisted back to bed end of session.  Pt anticipates d/c home tomorrow.  Follow Up Recommendations  Outpatient PT;Follow surgeon's recommendation for DC plan and follow-up therapies     Equipment Recommendations  None recommended by PT    Recommendations for Other Services       Precautions / Restrictions Precautions Precautions: Fall;Knee Required Braces or Orthoses: Knee Immobilizer - Left Restrictions Other Position/Activity Restrictions: WBAT    Mobility  Bed Mobility Overal bed mobility: Needs Assistance Bed Mobility: Sit to Supine     Supine to sit: Min guard Sit to supine: Min assist   General bed mobility comments: verbal cues for technique  Transfers Overall transfer level: Needs assistance Equipment used: Rolling walker (2 wheeled) Transfers: Sit to/from Stand Sit to Stand: Min guard         General transfer comment: verbal cues for UE and LE positioning  Ambulation/Gait Ambulation/Gait assistance: Min assist Ambulation Distance (Feet): 40 Feet Assistive device: Rolling walker (2 wheeled) Gait Pattern/deviations: Step-to pattern;Decreased stance time - left;Antalgic     General Gait Details: verbal cues for sequence, RW positioning, posture, step length; distance limited by pain per pt   Stairs             Wheelchair Mobility    Modified Rankin (Stroke Patients Only)       Balance                                            Cognition Arousal/Alertness: Awake/alert Behavior During Therapy: WFL for tasks assessed/performed Overall Cognitive Status: Within Functional Limits for tasks assessed                                         Exercises Total Joint Exercises Ankle Circles/Pumps: AROM;10 reps Quad Sets: AROM;10 reps;Left Short Arc Quad: AAROM;10 reps;Left Heel Slides: AAROM;10 reps;Left Hip ABduction/ADduction: AROM;10 reps;Left Straight Leg Raises: AAROM;10 reps;Left Goniometric ROM: approximately 40* AAROM L knee flexion    General Comments        Pertinent Vitals/Pain Pain Assessment: 0-10 Pain Score: 7  Pain Location: L knee Pain Descriptors / Indicators: Aching;Sore Pain Intervention(s): Limited activity within patient's tolerance;Repositioned;Monitored during session;Ice applied    Home Living                      Prior Function            PT Goals (current goals can now be found in the care plan section) Progress towards PT goals: Progressing toward goals    Frequency    7X/week      PT Plan Current plan remains appropriate    Co-evaluation              AM-PAC PT "6 Clicks" Daily Activity  Outcome Measure  Difficulty turning over in bed (including adjusting bedclothes, sheets and blankets)?: A Little Difficulty moving from lying on back to sitting on the side of the bed? : A Lot Difficulty sitting  down on and standing up from a chair with arms (e.g., wheelchair, bedside commode, etc,.)?: Unable Help needed moving to and from a bed to chair (including a wheelchair)?: A Little Help needed walking in hospital room?: A Little Help needed climbing 3-5 steps with a railing? : A Lot 6 Click Score: 14    End of Session Equipment Utilized During Treatment: Left knee immobilizer Activity Tolerance: Patient tolerated treatment well Patient left: with call bell/phone within reach;in bed;with nursing/sitter in room   PT Visit Diagnosis: Other abnormalities of gait and mobility (R26.89)     Time: 6222-9798 PT Time Calculation (min) (ACUTE ONLY): 31 min  Charges:  $Gait Training: 8-22 mins $Therapeutic Exercise: 8-22 mins                    G Codes:        Carmelia Bake, PT, DPT 06/22/2017 Pager: [phone]  York Ram E 06/22/2017, 4:41 PM

## 2017-06-22 NOTE — Progress Notes (Signed)
Physical Therapy Treatment Patient Details Name: Jade Cardenas MRN: [id number] DOB: [birth year] Today's Date: 06/22/2017    History of Present Illness Pt is a 67 year old female s/p L TKA    PT Comments    Pt ambulated in hallway and tolerated fair distance however reports increased pain upon return to room and declined performing exercises this morning.  Follow Up Recommendations  Outpatient PT;Follow surgeon's recommendation for DC plan and follow-up therapies     Equipment Recommendations  None recommended by PT    Recommendations for Other Services       Precautions / Restrictions Precautions Precautions: Fall;Knee Required Braces or Orthoses: Knee Immobilizer - Left Restrictions Weight Bearing Restrictions: No Other Position/Activity Restrictions: WBAT    Mobility  Bed Mobility Overal bed mobility: Needs Assistance Bed Mobility: Supine to Sit     Supine to sit: Min guard     General bed mobility comments: verbal cues for technique  Transfers Overall transfer level: Needs assistance Equipment used: Rolling walker (2 wheeled) Transfers: Sit to/from Stand Sit to Stand: Min assist         General transfer comment: verbal cues for UE and LE positioning, assist to rise, steady, and control descent  Ambulation/Gait Ambulation/Gait assistance: Min assist Ambulation Distance (Feet): 120 Feet Assistive device: Rolling walker (2 wheeled) Gait Pattern/deviations: Step-to pattern;Decreased stance time - left;Antalgic     General Gait Details: verbal cues for sequence, RW positioning, posture, step length   Stairs             Wheelchair Mobility    Modified Rankin (Stroke Patients Only)       Balance                                            Cognition Arousal/Alertness: Awake/alert Behavior During Therapy: WFL for tasks assessed/performed Overall Cognitive Status: Within Functional Limits for tasks assessed                                        Exercises      General Comments        Pertinent Vitals/Pain Pain Assessment: 0-10 Pain Score: 7  Pain Location: L knee Pain Descriptors / Indicators: Aching;Sore Pain Intervention(s): Limited activity within patient's tolerance;Repositioned;Monitored during session;Premedicated before session;Ice applied    Home Living                      Prior Function            PT Goals (current goals can now be found in the care plan section) Progress towards PT goals: Progressing toward goals    Frequency    7X/week      PT Plan Current plan remains appropriate    Co-evaluation              AM-PAC PT "6 Clicks" Daily Activity  Outcome Measure  Difficulty turning over in bed (including adjusting bedclothes, sheets and blankets)?: A Little Difficulty moving from lying on back to sitting on the side of the bed? : A Lot Difficulty sitting down on and standing up from a chair with arms (e.g., wheelchair, bedside commode, etc,.)?: A Lot Help needed moving to and from a bed to chair (including a wheelchair)?: A Little Help needed  walking in hospital room?: A Little Help needed climbing 3-5 steps with a railing? : A Lot 6 Click Score: 15    End of Session Equipment Utilized During Treatment: Gait belt;Left knee immobilizer Activity Tolerance: Patient tolerated treatment well Patient left: in chair;with chair alarm set;with call bell/phone within reach;with family/visitor present   PT Visit Diagnosis: Other abnormalities of gait and mobility (R26.89)     Time: 7711-6579 PT Time Calculation (min) (ACUTE ONLY): 22 min  Charges:  $Gait Training: 8-22 mins                    G Codes:       Jade Cardenas, PT, DPT 06/22/2017 Pager: [phone]  York Ram E 06/22/2017, 12:59 PM

## 2017-06-23 LAB — BASIC METABOLIC PANEL
Anion gap: 12 (ref 5–15)
BUN: 12 mg/dL (ref 6–20)
CO2: 24 mmol/L (ref 22–32)
Calcium: 9 mg/dL (ref 8.9–10.3)
Chloride: 102 mmol/L (ref 101–111)
Creatinine, Ser: 0.59 mg/dL (ref 0.44–1.00)
GFR calc Af Amer: >60 mL/min (ref >60)
GFR calc non Af Amer: >60 mL/min (ref >60)
Glucose, Bld: 115 mg/dL — ABNORMAL HIGH (ref 65–99)
Potassium: 4 mmol/L (ref 3.5–5.1)
Sodium: 138 mmol/L (ref 135–145)

## 2017-06-23 LAB — CBC
HCT: 35.7 % — ABNORMAL LOW (ref 36.0–46.0)
Hemoglobin: 12.2 g/dL (ref 12.0–15.0)
MCH: 32.9 pg (ref 26.0–34.0)
MCHC: 34.2 g/dL (ref 30.0–36.0)
MCV: 96.2 fL (ref 78.0–100.0)
Platelets: 292 10*3/uL (ref 150–400)
RBC: 3.71 MIL/uL — ABNORMAL LOW (ref 3.87–5.11)
RDW: 13.2 % (ref 11.5–15.5)
WBC: 10 10*3/uL (ref 4.0–10.5)

## 2017-06-23 MED ORDER — HYDROMORPHONE HCL 2 MG PO TABS: 2.0000 mg | ORAL_TABLET | Freq: Four times a day (QID) | ORAL | 0 refills | Status: DC | PRN

## 2017-06-23 MED ORDER — HYDROMORPHONE HCL 2 MG PO TABS
2.0000 mg | ORAL_TABLET | ORAL | Status: DC | PRN
  Administered 2017-06-23: 4 mg via ORAL
  Administered 2017-06-23: 2 mg via ORAL
  Administered 2017-06-24 (×2): 4 mg via ORAL
  Administered 2017-06-24 (×2): 2 mg via ORAL
  Administered 2017-06-24 – 2017-06-25 (×6): 4 mg via ORAL
  Filled 2017-06-23: qty 2
  Filled 2017-06-23: qty 1
  Filled 2017-06-23: qty 2
  Filled 2017-06-23: qty 1
  Filled 2017-06-23: qty 2
  Filled 2017-06-23 (×2): qty 1
  Filled 2017-06-23 (×6): qty 2

## 2017-06-23 NOTE — Progress Notes (Signed)
Physical Therapy Treatment Patient Details Name: Jade Cardenas MRN: [id number] DOB: 1950/08/07 Today's Date: 06/23/2017    History of Present Illness Pt is a 67 year old female s/p L TKA    PT Comments    Pt received pain meds approximately an hour before session.  Pt assisted with bed mobility however unable to stand to due extreme pain.  Pt rates pain as 10/10 L knee with any mobility.  RN notified.   Follow Up Recommendations  Outpatient PT;Follow surgeon's recommendation for DC plan and follow-up therapies     Equipment Recommendations  None recommended by PT    Recommendations for Other Services       Precautions / Restrictions Precautions Precautions: Fall;Knee Required Braces or Orthoses: Knee Immobilizer - Left Restrictions Other Position/Activity Restrictions: WBAT    Mobility  Bed Mobility Overal bed mobility: Needs Assistance Bed Mobility: Supine to Sit;Sit to Supine     Supine to sit: Min assist Sit to supine: Min assist   General bed mobility comments: verbal cues for technique, assist for L LE  Transfers                 General transfer comment: pt attempted however unable to stand due to pain  Ambulation/Gait                 Stairs             Wheelchair Mobility    Modified Rankin (Stroke Patients Only)       Balance                                            Cognition Arousal/Alertness: Awake/alert Behavior During Therapy: WFL for tasks assessed/performed Overall Cognitive Status: Within Functional Limits for tasks assessed                                        Exercises      General Comments        Pertinent Vitals/Pain Pain Assessment: 0-10 Pain Score: 9  Pain Location: L knee Pain Descriptors / Indicators: Aching;Sore Pain Intervention(s): Limited activity within patient's tolerance;Repositioned;Monitored during session;Premedicated before session    Home  Living                      Prior Function            PT Goals (current goals can now be found in the care plan section) Progress towards PT goals: Not progressing toward goals - comment(pain)    Frequency    7X/week      PT Plan Current plan remains appropriate    Co-evaluation              AM-PAC PT "6 Clicks" Daily Activity  Outcome Measure  Difficulty turning over in bed (including adjusting bedclothes, sheets and blankets)?: A Little Difficulty moving from lying on back to sitting on the side of the bed? : Unable Difficulty sitting down on and standing up from a chair with arms (e.g., wheelchair, bedside commode, etc,.)?: Unable Help needed moving to and from a bed to chair (including a wheelchair)?: A Lot Help needed walking in hospital room?: A Lot Help needed climbing 3-5 steps with a railing? : A Lot 6 Click Score:  11    End of Session Equipment Utilized During Treatment: Left knee immobilizer Activity Tolerance: Patient tolerated treatment well Patient left: with call bell/phone within reach;in bed;with bed alarm set Nurse Communication: Mobility status PT Visit Diagnosis: Other abnormalities of gait and mobility (R26.89)     Time: 5188-4166 PT Time Calculation (min) (ACUTE ONLY): 12 min  Charges:  $Therapeutic Activity: 8-22 mins                    G Codes:      Carmelia Bake, PT, DPT 06/23/2017 Pager: [phone]    York Ram E 06/23/2017, 12:05 PM

## 2017-06-23 NOTE — Progress Notes (Signed)
Subjective: 2 Days Post-Op Procedure(s) (LRB): LEFT TOTAL KNEE ARTHROPLASTY AND RIGHT KNEE CORTISONE INJECTION (N/A) Patient reports pain as moderate.   Patient seen in rounds with Dr. Wynelle Link. Patient is well, and has had no acute complaints or problems other than pain in the left knee. She reports that she had an increased in pain after the CPM yesterday. Voiding well. Positive flatus. Plan is to go Home after hospital stay.  Objective: Vital signs in last 24 hours: Temp:  [97.7 F (36.5 C)-100.2 F (37.9 C)] 98.5 F (36.9 C) (04/24 0630) Pulse Rate:  [80-93] 93 (04/24 0652) Resp:  [16-21] 21 (04/24 0652) BP: (110-174)/(56-81) 174/66 (04/24 0652) SpO2:  [98 %-100 %] 98 % (04/24 0652)  Intake/Output from previous day:  Intake/Output Summary (Last 24 hours) at 06/23/2017 0739 Last data filed at 06/23/2017 1601 Gross per 24 hour  Intake 2730 ml  Output 2050 ml  Net 680 ml     Labs: Recent Labs    06/22/17 0555 06/23/17 0547  HGB 12.7 12.2   Recent Labs    06/22/17 0555 06/23/17 0547  WBC 12.0* 10.0  RBC 3.77* 3.71*  HCT 36.4 35.7*  PLT 315 292   Recent Labs    06/22/17 0555 06/23/17 0547  NA 143 138  K 4.5 4.0  CL 106 102  CO2 26 24  BUN 11 12  CREATININE 0.60 0.59  GLUCOSE 148* 115*  CALCIUM 9.5 9.0    EXAM General - Patient is Alert and Oriented Extremity - Neurologically intact Intact pulses distally Dorsiflexion/Plantar flexion intact Compartment soft Dressing/Incision - clean, dry, no drainage Motor Function - intact, moving foot and toes well on exam.   Past Medical History:  Diagnosis Date  . Anemia    iron def  . Arthritis    rheumatoid and osteo now flt to be psoriateic   . Arthritis    psoreratic, osteoarthritis  . Broken toe   . Bruises easily   . CHOLECYSTECTOMY, HX OF   . Chronic back pain   . COLONIC POLYPS, HX OF 02/09/2007  . Cough, persistent 01/21/2012   poss from acei   . 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  Fibromyalgia    doesn't require meds  . GASTRIC ULCER, ACUTE, HEMORRHAGE, HX OF 02/09/2007  . GERD (gastroesophageal reflux disease)    takes Nexium daily  . Goiter   . H/O hiatal hernia   . Headache(784.0)    occasionally  . History of bronchitis   . Hx of seasonal allergies    takes Claritin daily  . HYPERLIPIDEMIA 12/29/2006   takes Lovastatin nightly  . HYPERTENSION 12/29/2006   takes Enalapril daily  . IBS 04/28/2007  . Insomnia    related to pain;takes Flexeril and Tylenol PM nightly  . Joint pain   . Joint swelling   . Palpitations 02/09/2007  . PONV (postoperative nausea and vomiting)    NAUSEA  . REDUCTION MAMMOPLASTY, HX OF 02/09/2007  . Rheumatoid arthritis(714.0) 12/29/2006  . Right bundle branch block 02/09/2007  . S/P lumbar fusion 6 13 09/03/2011   L4 L5  posterior   . SYNCOPE 12/07/2007  . UNS ADVRS EFF OTH RX MEDICINAL\T\BIOLOGICAL SBSTNC 02/09/2007    Assessment/Plan: 2 Days Post-Op Procedure(s) (LRB): LEFT TOTAL KNEE ARTHROPLASTY AND RIGHT KNEE CORTISONE INJECTION (N/A) Active Problems:   OA (osteoarthritis) of knee  Estimated body mass index is 35.3 kg/m as calculated from the following:   Height as of this encounter: 5\' 5"  (1.651  m).   Weight as of this encounter: 96.2 kg (212 lb 2 oz). Advance diet Up with therapy  DVT Prophylaxis - Xarelto Weight-Bearing as tolerated    Will discontinue CPM machine. Continue two session with therapy today. Possible DC home this afternoon if she progresses well. If not, stay until tomorrow.   Ardeen Jourdain, PA-C Orthopaedic Surgery 06/23/2017, 7:39 AM

## 2017-06-24 ENCOUNTER — Inpatient Hospital Stay (HOSPITAL_COMMUNITY): Payer: Medicare Other

## 2017-06-24 LAB — CBC
HCT: 36.1 % (ref 36.0–46.0)
Hemoglobin: 12.5 g/dL (ref 12.0–15.0)
MCH: 33.6 pg (ref 26.0–34.0)
MCHC: 34.6 g/dL (ref 30.0–36.0)
MCV: 97 fL (ref 78.0–100.0)
Platelets: 282 10*3/uL (ref 150–400)
RBC: 3.72 MIL/uL — ABNORMAL LOW (ref 3.87–5.11)
RDW: 12.9 % (ref 11.5–15.5)
WBC: 10.9 10*3/uL — ABNORMAL HIGH (ref 4.0–10.5)

## 2017-06-24 MED ORDER — LOSARTAN POTASSIUM 50 MG PO TABS: 100.0000 mg | ORAL_TABLET | Freq: Every day | ORAL | Status: DC

## 2017-06-24 NOTE — Care Management Important Message (Signed)
Important Message  Patient Details  Name: Jade Cardenas MRN: [id number] Date of Birth: 1950/05/05   Medicare Important Message Given:  Yes    Kerin Salen 06/24/2017, 12:30 PMImportant Message  Patient Details  Name: Jade Cardenas MRN: [id number] Date of Birth: [birth year]   Medicare Important Message Given:  Yes    Kerin Salen 06/24/2017, 12:30 PM

## 2017-06-24 NOTE — Progress Notes (Signed)
Physical Therapy Treatment Patient Details Name: Jade Cardenas MRN: [id number] DOB: 1950-09-28 Today's Date: 06/24/2017    History of Present Illness Pt is a 67 year old female s/p L TKA + Peri Prosthetic Fx Medial Condyle    PT Comments    POD # 3 Pt not seen in am due to awaiting for clarification after knee X ray this morning.   Pt seen in pm.  Already  OOB in recliner.  Applied KI and instructed on use for amb until SLR.  Assisted with amb to bathroom 10 feet required MAX encouragement as pt was emotional and worried about how she was going to manage at home.  Assisted in bathroom with supporting L LE with sit to stand and stand to sit plus standing balance as pt don/doff shorts over KI.  Max encouragement to get pt to amb to hallway.  Tolerated full WBing L LW wearing KI.  Assisted back to bed per pt request.  Removed KI and applied ICE.  Pt declined any TE's due to pain.     Follow Up Recommendations  Outpatient PT;Follow surgeon's recommendation for DC plan and follow-up therapies     Equipment Recommendations  None recommended by PT    Recommendations for Other Services       Precautions / Restrictions Precautions Precautions: Fall;Knee Precaution Comments: instructed on KI use for amb until SLR Required Braces or Orthoses: Knee Immobilizer - Left Restrictions Weight Bearing Restrictions: No Other Position/Activity Restrictions: WBAT    Mobility  Bed Mobility Overal bed mobility: Needs Assistance         Sit to supine: Min assist   General bed mobility comments: assist with L LE and increased time due to pain level  Transfers Overall transfer level: Needs assistance Equipment used: Rolling walker (2 wheeled) Transfers: Sit to/from [facility] Sit to Stand: Min assist Stand pivot transfers: Min assist       General transfer comment: 75% VC's on proper hand placement, safety with turns using walker and assist with L LE each sit to stand  and stand to sit  Ambulation/Gait Ambulation/Gait assistance: Min assist Ambulation Distance (Feet): 20 Feet(10 feet x 2 to and from bathroom only) Assistive device: Rolling walker (2 wheeled) Gait Pattern/deviations: Step-to pattern;Decreased stance time - left Gait velocity: decreased   General Gait Details: MAX encouragement as well as 50% VC's on proper walker to self distance and safety with turns.  Pt distracted with emotions and worry about how she will manage at home and c/o pain level with dizziness with activity.   Stairs             Wheelchair Mobility    Modified Rankin (Stroke Patients Only)       Balance                                            Cognition Arousal/Alertness: Awake/alert Behavior During Therapy: WFL for tasks assessed/performed                                   General Comments: emotional, worried about how she will manage at home,       Exercises      General Comments        Pertinent Vitals/Pain Pain Assessment: 0-10 Pain Score: 9  Pain Location: L knee Pain Descriptors / Indicators: Aching;Sore;Grimacing;Operative site guarding Pain Intervention(s): Monitored during session;Repositioned;Ice applied    Home Living                      Prior Function            PT Goals (current goals can now be found in the care plan section) Progress towards PT goals: Progressing toward goals    Frequency    7X/week      PT Plan Current plan remains appropriate    Co-evaluation              AM-PAC PT "6 Clicks" Daily Activity  Outcome Measure  Difficulty turning over in bed (including adjusting bedclothes, sheets and blankets)?: A Little Difficulty moving from lying on back to sitting on the side of the bed? : A Little Difficulty sitting down on and standing up from a chair with arms (e.g., wheelchair, bedside commode, etc,.)?: A Little Help needed moving to and from a bed  to chair (including a wheelchair)?: A Little Help needed walking in hospital room?: A Little Help needed climbing 3-5 steps with a railing? : A Lot 6 Click Score: 17    End of Session Equipment Utilized During Treatment: Left knee immobilizer Activity Tolerance: Patient limited by pain;Other (comment)(emotional state) Patient left: in bed;with call bell/phone within reach Nurse Communication: Mobility status PT Visit Diagnosis: Other abnormalities of gait and mobility (R26.89)     Time: 1355-1420 PT Time Calculation (min) (ACUTE ONLY): 25 min  Charges:  $Gait Training: 8-22 mins $Therapeutic Activity: 8-22 mins                    G Codes:       Rica Koyanagi  PTA [facility]  Acute  Rehab Pager      [phone]

## 2017-06-24 NOTE — Progress Notes (Signed)
Subjective: 3 Days Post-Op Procedure(s) (LRB): LEFT TOTAL KNEE ARTHROPLASTY AND RIGHT KNEE CORTISONE INJECTION (N/A) Patient reports pain as moderate.   Patient seen in rounds with Dr. Wynelle Link. Patient is well, and has had no acute complaints or problems except pain in the left knee. Pain appears to be in better control this morning since switch to Dilaudid PO. Voiding well. Positive flatus.  Plan is to go Home after hospital stay.  Objective: Vital signs in last 24 hours: Temp:  [97.6 F (36.4 C)-101.5 F (38.6 C)] 98.3 F (36.8 C) (04/25 0515) Pulse Rate:  [87-102] 87 (04/25 0515) Resp:  [15-18] 15 (04/25 0515) BP: (142-180)/(57-72) 180/72 (04/25 0515) SpO2:  [98 %-100 %] 100 % (04/25 0515)  Intake/Output from previous day:  Intake/Output Summary (Last 24 hours) at 06/24/2017 0816 Last data filed at 06/24/2017 0500 Gross per 24 hour  Intake 840 ml  Output 2400 ml  Net -1560 ml     Labs: Recent Labs    06/22/17 0555 06/23/17 0547 06/24/17 0545  HGB 12.7 12.2 12.5   Recent Labs    06/23/17 0547 06/24/17 0545  WBC 10.0 10.9*  RBC 3.71* 3.72*  HCT 35.7* 36.1  PLT 292 282   Recent Labs    06/22/17 0555 06/23/17 0547  NA 143 138  K 4.5 4.0  CL 106 102  CO2 26 24  BUN 11 12  CREATININE 0.60 0.59  GLUCOSE 148* 115*  CALCIUM 9.5 9.0    EXAM General - Patient is Alert and Oriented Extremity - Neurologically intact Intact pulses distally Dorsiflexion/Plantar flexion intact Compartment soft Dressing/Incision - clean, dry, no drainage Motor Function - intact, moving foot and toes well on exam.  Unable to perform SLR  Past Medical History:  Diagnosis Date  . Anemia    iron def  . Arthritis    rheumatoid and osteo now flt to be psoriateic   . Arthritis    psoreratic, osteoarthritis  . Broken toe   . Bruises easily   . CHOLECYSTECTOMY, HX OF   . Chronic back pain   . COLONIC POLYPS, HX OF 02/09/2007  . Cough, persistent 01/21/2012   poss from  acei   . 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 Fibromyalgia    doesn't require meds  . GASTRIC ULCER, ACUTE, HEMORRHAGE, HX OF 02/09/2007  . GERD (gastroesophageal reflux disease)    takes Nexium daily  . Goiter   . H/O hiatal hernia   . Headache(784.0)    occasionally  . History of bronchitis   . Hx of seasonal allergies    takes Claritin daily  . HYPERLIPIDEMIA 12/29/2006   takes Lovastatin nightly  . HYPERTENSION 12/29/2006   takes Enalapril daily  . IBS 04/28/2007  . Insomnia    related to pain;takes Flexeril and Tylenol PM nightly  . Joint pain   . Joint swelling   . Palpitations 02/09/2007  . PONV (postoperative nausea and vomiting)    NAUSEA  . REDUCTION MAMMOPLASTY, HX OF 02/09/2007  . Rheumatoid arthritis(714.0) 12/29/2006  . Right bundle branch block 02/09/2007  . S/P lumbar fusion 6 13 09/03/2011   L4 L5  posterior   . SYNCOPE 12/07/2007  . UNS ADVRS EFF OTH RX MEDICINAL\T\BIOLOGICAL SBSTNC 02/09/2007    Assessment/Plan: 3 Days Post-Op Procedure(s) (LRB): LEFT TOTAL KNEE ARTHROPLASTY AND RIGHT KNEE CORTISONE INJECTION (N/A) Active Problems:   OA (osteoarthritis) of knee  Estimated body mass index is 35.3 kg/m as calculated from the following:  Height as of this encounter: 5\' 5"  (1.651 m).   Weight as of this encounter: 96.2 kg (212 lb 2 oz). Advance diet Up with therapy  DVT Prophylaxis - Xarelto Weight-Bearing as tolerated   Will get plain films of the left knee to make sure that there is no movement of the fracture site or prosthesis. Proceed with therapy if xrays are normal. Hopeful for DC home tomorrow if improved.   Ardeen Jourdain, PA-C Orthopaedic Surgery 06/24/2017, 8:16 AM

## 2017-06-25 NOTE — Progress Notes (Signed)
    Durable Medical Equipment  (From admission, onward)        Start     Ordered   06/25/17 1031  For home use only DME 3 n 1  Once     06/25/17 1030    [phone]

## 2017-06-25 NOTE — Progress Notes (Signed)
Physical Therapy Treatment Patient Details Name: Jade Cardenas MRN: [id number] DOB: January 23, 1951 Today's Date: 06/25/2017    History of Present Illness Pt is a 67 year old female s/p L TKA + Peri Prosthetic Fx Medial Condyle    PT Comments    POD # 4 Pt having a better day and eager to get home.  Applied KI and instructed on use for amb until she is able to perform SLR.  Currently <25% able.  Assisted with amb a functional distance in hallway.  Returned to room and performed some TKR TE's following HEP handout.  Pt limited by pain and self guarding due to fear of more pain.  Instructed on proper tech, freq as well as use of ICE.  Instructed on proper tech to get in/out car.  Pt plans to D/C to home with [facility] PT vs OP.    Follow Up Recommendations  Home health PT(MD switched to [facility] PT)     Equipment Recommendations  3in1 (PT)(pt would like a #:! for night use )    Recommendations for Other Services       Precautions / Restrictions Precautions Precautions: Fall;Knee Precaution Comments: instructed on KI use for amb until SLR Required Braces or Orthoses: Knee Immobilizer - Left Restrictions Weight Bearing Restrictions: No Other Position/Activity Restrictions: WBAT    Mobility  Bed Mobility               General bed mobility comments: OOB in recliner  Transfers Overall transfer level: Needs assistance Equipment used: Rolling walker (2 wheeled) Transfers: Sit to/from [facility] Sit to Stand: Supervision;Min guard Stand pivot transfers: Supervision;Min guard       General transfer comment: 25% VC's on safety with turn completion and advancement L LE prior to sit and stand  Ambulation/Gait Ambulation/Gait assistance: Supervision;Min guard Ambulation Distance (Feet): 45 Feet Assistive device: Rolling walker (2 wheeled) Gait Pattern/deviations: Step-to pattern;Decreased stance time - left Gait velocity: decreased   General Gait Details: amb a  functional distance with <25% VC's safety with turns and increased time.  Instructed to wear KI for amb until able to perform SLR   Stairs Stairs: (no stairs to enter [employer])           Engineer, building services Rankin (Stroke Patients Only)       Balance                                            Cognition Arousal/Alertness: Awake/alert Behavior During Therapy: WFL for tasks assessed/performed Overall Cognitive Status: Within Functional Limits for tasks assessed                                 General Comments: better emotional day and eager to go home      Exercises   Total Knee Replacement TE's 10 reps B LE ankle pumps 10 reps towel squeezes 10 reps knee presses 05 reps heel slides using a long bed sheet 10 reps SLR's using a long bed sheety 10 reps ABD using a long bed sheet Followed by ICE     General Comments        Pertinent Vitals/Pain Pain Assessment: 0-10 Pain Score: 7  Pain Location: L knee Pain Descriptors / Indicators: Aching;Sore;Grimacing;Operative site guarding Pain Intervention(s): Monitored during session;Repositioned;Ice applied;Premedicated before  session    Home Living                      Prior Function            PT Goals (current goals can now be found in the care plan section) Progress towards PT goals: Progressing toward goals    Frequency    7X/week      PT Plan Current plan remains appropriate    Co-evaluation              AM-PAC PT "6 Clicks" Daily Activity  Outcome Measure  Difficulty turning over in bed (including adjusting bedclothes, sheets and blankets)?: A Little Difficulty moving from lying on back to sitting on the side of the bed? : A Little Difficulty sitting down on and standing up from a chair with arms (e.g., wheelchair, bedside commode, etc,.)?: A Little Help needed moving to and from a bed to chair (including a wheelchair)?: A Little Help  needed walking in hospital room?: A Little Help needed climbing 3-5 steps with a railing? : A Little 6 Click Score: 18    End of Session Equipment Utilized During Treatment: Left knee immobilizer Activity Tolerance: Patient tolerated treatment well Patient left: in chair;with call bell/phone within reach Nurse Communication: (pt wants a BSC for home and is ready for D/C after one PT session) PT Visit Diagnosis: Other abnormalities of gait and mobility (R26.89)     Time: 1610-9604 PT Time Calculation (min) (ACUTE ONLY): 40 min  Charges:  $Gait Training: 8-22 mins $Therapeutic Exercise: 8-22 mins $Therapeutic Activity: 8-22 mins                    G Codes:       Rica Koyanagi  PTA [facility]  Acute  Rehab Pager      [phone]

## 2017-06-25 NOTE — Progress Notes (Signed)
Subjective: 4 Days Post-Op Procedure(s) (LRB): LEFT TOTAL KNEE ARTHROPLASTY AND RIGHT KNEE CORTISONE INJECTION (N/A) Patient reports pain as mild.   Patient seen in rounds for Dr. Wynelle Link. Patient is well, and has had no acute complaints or problems other than pain in the left knee. She feels like she is getting a little better control of the pain. She would like to go home today if possible. Voiding. Has had BM.  Plan is to go Home after hospital stay.  Objective: Vital signs in last 24 hours: Temp:  [98 F (36.7 C)-98.2 F (36.8 C)] 98 F (36.7 C) (04/26 0540) Pulse Rate:  [96-103] 96 (04/26 0540) Resp:  [14-19] 17 (04/26 0540) BP: (108-143)/(47-72) 143/72 (04/26 0540) SpO2:  [97 %-98 %] 98 % (04/26 0540)  Intake/Output from previous day:  Intake/Output Summary (Last 24 hours) at 06/25/2017 0714 Last data filed at 06/25/2017 0600 Gross per 24 hour  Intake 1100 ml  Output 300 ml  Net 800 ml     Labs: Recent Labs    06/23/17 0547 06/24/17 0545  HGB 12.2 12.5   Recent Labs    06/23/17 0547 06/24/17 0545  WBC 10.0 10.9*  RBC 3.71* 3.72*  HCT 35.7* 36.1  PLT 292 282   Recent Labs    06/23/17 0547  NA 138  K 4.0  CL 102  CO2 24  BUN 12  CREATININE 0.59  GLUCOSE 115*  CALCIUM 9.0    EXAM General - Patient is Alert and Oriented Extremity - Neurologically intact Intact pulses distally Dorsiflexion/Plantar flexion intact Compartment soft Dressing/Incision - clean, dry, no drainage Motor Function - intact, moving foot and toes well on exam.   Past Medical History:  Diagnosis Date  . Anemia    iron def  . Arthritis    rheumatoid and osteo now flt to be psoriateic   . Arthritis    psoreratic, osteoarthritis  . Broken toe   . Bruises easily   . CHOLECYSTECTOMY, HX OF   . Chronic back pain   . COLONIC POLYPS, HX OF 02/09/2007  . Cough, persistent 01/21/2012   poss from acei   . 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 Fibromyalgia    doesn't require  meds  . GASTRIC ULCER, ACUTE, HEMORRHAGE, HX OF 02/09/2007  . GERD (gastroesophageal reflux disease)    takes Nexium daily  . Goiter   . H/O hiatal hernia   . Headache(784.0)    occasionally  . History of bronchitis   . Hx of seasonal allergies    takes Claritin daily  . HYPERLIPIDEMIA 12/29/2006   takes Lovastatin nightly  . HYPERTENSION 12/29/2006   takes Enalapril daily  . IBS 04/28/2007  . Insomnia    related to pain;takes Flexeril and Tylenol PM nightly  . Joint pain   . Joint swelling   . Palpitations 02/09/2007  . PONV (postoperative nausea and vomiting)    NAUSEA  . REDUCTION MAMMOPLASTY, HX OF 02/09/2007  . Rheumatoid arthritis(714.0) 12/29/2006  . Right bundle branch block 02/09/2007  . S/P lumbar fusion 6 13 09/03/2011   L4 L5  posterior   . SYNCOPE 12/07/2007  . UNS ADVRS EFF OTH RX MEDICINAL\T\BIOLOGICAL SBSTNC 02/09/2007    Assessment/Plan: 4 Days Post-Op Procedure(s) (LRB): LEFT TOTAL KNEE ARTHROPLASTY AND RIGHT KNEE CORTISONE INJECTION (N/A) Active Problems:   OA (osteoarthritis) of knee  Estimated body mass index is 35.3 kg/m as calculated from the following:   Height as of this encounter: 5\' 5"  (1.651  m).   Weight as of this encounter: 96.2 kg (212 lb 2 oz). Advance diet Up with therapy  DVT Prophylaxis - Xarelto Weight-Bearing as tolerated   Will have her do two sessions of therapy today. If she is meeting her goals and is stable, will DC home today.  Ardeen Jourdain, PA-C Orthopaedic Surgery 06/25/2017, 7:14 AM

## 2017-06-25 NOTE — Progress Notes (Signed)
Patient is currently using the external purewick catheter,refusing OOB to RR overnight.

## 2017-06-27 NOTE — Discharge Summary (Signed)
Physician Discharge Summary   Patient ID: Jade Cardenas MRN: [id number] DOB/AGE: [birth year] 67 y.o.   Admit date: 06/21/2017 Discharge date: 06/25/2017  Primary Diagnosis: Primary osteoarthritis left knee  Admission Diagnoses:  Past Medical History:  Diagnosis Date  . Anemia    iron def  . Arthritis    rheumatoid and osteo now flt to be psoriateic   . Arthritis    psoreratic, osteoarthritis  . Broken toe   . Bruises easily   . CHOLECYSTECTOMY, HX OF   . Chronic back pain   . COLONIC POLYPS, HX OF 02/09/2007  . Cough, persistent 01/21/2012   poss from acei   . 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 Fibromyalgia    doesn't require meds  . GASTRIC ULCER, ACUTE, HEMORRHAGE, HX OF 02/09/2007  . GERD (gastroesophageal reflux disease)    takes Nexium daily  . Goiter   . H/O hiatal hernia   . Headache(784.0)    occasionally  . History of bronchitis   . Hx of seasonal allergies    takes Claritin daily  . HYPERLIPIDEMIA 12/29/2006   takes Lovastatin nightly  . HYPERTENSION 12/29/2006   takes Enalapril daily  . IBS 04/28/2007  . Insomnia    related to pain;takes Flexeril and Tylenol PM nightly  . Joint pain   . Joint swelling   . Palpitations 02/09/2007  . PONV (postoperative nausea and vomiting)    NAUSEA  . REDUCTION MAMMOPLASTY, HX OF 02/09/2007  . Rheumatoid arthritis(714.0) 12/29/2006  . Right bundle branch block 02/09/2007  . S/P lumbar fusion 6 13 09/03/2011   L4 L5  posterior   . SYNCOPE 12/07/2007  . UNS ADVRS EFF OTH RX MEDICINAL\T\BIOLOGICAL SBSTNC 02/09/2007   Discharge Diagnoses:   Active Problems:   OA (osteoarthritis) of knee  Estimated body mass index is 35.3 kg/m as calculated from the following:   Height as of this encounter: '5\' 5"'  (1.651 m).   Weight as of this encounter: 96.2 kg (212 lb 2 oz).  Procedure:  Procedure(s) (LRB): LEFT TOTAL KNEE ARTHROPLASTY AND RIGHT KNEE CORTISONE INJECTION (N/A)   Consults: None  HPI: Patient has been seen  for her ongoing left knee greater than right knee pain. She states she has been dealing with the left knee since March of 2018, with known injury. The knee does give way so she has to walk with a cane. She has tried cortisone, viscosupplementation and PT with no relief. Her knee does swell and it wakes her at night, throbbing. She was seen by Dr. Estanislado Pandy and had left knee and pelvis xrays and a left knee MRI. She takes Tylenol for pain and topical creams as needed. [name] has had progressively worsening LEFT knee pain since this started several months ago. She is at a stage where the knee is hurting at all times. It is limiting what she can and cannot do. She even has pain at rest and at night. Other modalities have not helped at all. She is at a stage now where she feels like she needs a more permanent solution to this knee problem. Her right knee has also been a chronic problem for her in the past. She has been diagnosed with osteoarthritis in that knee as well. She would like to get an injection in that knee at the same time as her surgery on the left knee. AP and lateral of the LEFT knee as well as MRI of the LEFT knee from August 2018 show that she does  not have any significant degenerative change in the knee. There is some patellofemoral and medial narrowing. MRI shows some mild degenerative change but no meniscal tears. Given her rapid progression of symptoms and lack of response to injections, she had recent repeat x-rays that showed that she is now essentially bone-on-bone in the medial compartment. She also has an osteochondral defect in the medial tibial plateau. She has had a rapid progression of her symptoms as well as radiographic progression of the arthritis. Patient has significant pain and dysfunction in their knee. They have had injections and failed nonoperative management. At this point the pain and dysfunction have essentially taken over their life. The most predictable means of improving  pain and function is total knee arthroplasty. She is ready to proceed with surgery on the left knee and asked to receive a cortisone injection into the right knee at the same time.    Laboratory Data: Admission on 06/21/2017, Discharged on 06/25/2017  Component Date Value Ref Range Status  . WBC 06/22/2017 12.0* 4.0 - 10.5 K/uL Final  . RBC 06/22/2017 3.77* 3.87 - 5.11 MIL/uL Final  . Hemoglobin 06/22/2017 12.7  12.0 - 15.0 g/dL Final  . HCT 06/22/2017 36.4  36.0 - 46.0 % Final  . MCV 06/22/2017 96.6  78.0 - 100.0 fL Final  . MCH 06/22/2017 33.7  26.0 - 34.0 pg Final  . MCHC 06/22/2017 34.9  30.0 - 36.0 g/dL Final  . RDW 06/22/2017 13.2  11.5 - 15.5 % Final  . Platelets 06/22/2017 315  150 - 400 K/uL Final   Performed at [facility], [place] [address] [place], [place] [address]  . Sodium 06/22/2017 143  135 - 145 mmol/L Final  . Potassium 06/22/2017 4.5  3.5 - 5.1 mmol/L Final  . Chloride 06/22/2017 106  101 - 111 mmol/L Final  . CO2 06/22/2017 26  22 - 32 mmol/L Final  . Glucose, Bld 06/22/2017 148* 65 - 99 mg/dL Final  . BUN 06/22/2017 11  6 - 20 mg/dL Final  . Creatinine, Ser 06/22/2017 0.60  0.44 - 1.00 mg/dL Final  . Calcium 06/22/2017 9.5  8.9 - 10.3 mg/dL Final  . GFR calc non Af Amer 06/22/2017 >60  >60 mL/min Final  . GFR calc Af Amer 06/22/2017 >60  >60 mL/min Final   Comment: (NOTE) The eGFR has been calculated using the CKD EPI equation. This calculation has not been validated in all clinical situations. eGFR's persistently <60 mL/min signify possible Chronic Kidney Disease.   Georgiann Hahn gap 06/22/2017 11  5 - 15 Final   Performed at [facility], [place] [address] [place], [place] [address]  . WBC 06/23/2017 10.0  4.0 - 10.5 K/uL Final  . RBC 06/23/2017 3.71* 3.87 - 5.11 MIL/uL Final  . Hemoglobin 06/23/2017 12.2  12.0 - 15.0 g/dL Final  . HCT 06/23/2017 35.7* 36.0 - 46.0 % Final  . MCV 06/23/2017 96.2  78.0 - 100.0 fL Final  . MCH  06/23/2017 32.9  26.0 - 34.0 pg Final  . MCHC 06/23/2017 34.2  30.0 - 36.0 g/dL Final  . RDW 06/23/2017 13.2  11.5 - 15.5 % Final  . Platelets 06/23/2017 292  150 - 400 K/uL Final   Performed at [facility], [place] [address], [place], [place] [address]  . Sodium 06/23/2017 138  135 - 145 mmol/L Final  . Potassium 06/23/2017 4.0  3.5 - 5.1 mmol/L Final  . Chloride 06/23/2017 102  101 -  111 mmol/L Final  . CO2 06/23/2017 24  22 - 32 mmol/L Final  . Glucose, Bld 06/23/2017 115* 65 - 99 mg/dL Final  . BUN 06/23/2017 12  6 - 20 mg/dL Final  . Creatinine, Ser 06/23/2017 0.59  0.44 - 1.00 mg/dL Final  . Calcium 06/23/2017 9.0  8.9 - 10.3 mg/dL Final  . GFR calc non Af Amer 06/23/2017 >60  >60 mL/min Final  . GFR calc Af Amer 06/23/2017 >60  >60 mL/min Final   Comment: (NOTE) The eGFR has been calculated using the CKD EPI equation. This calculation has not been validated in all clinical situations. eGFR's persistently <60 mL/min signify possible Chronic Kidney Disease.   Georgiann Hahn gap 06/23/2017 12  5 - 15 Final   Performed at [facility], [place] [address] [place], [place] [address]  . WBC 06/24/2017 10.9* 4.0 - 10.5 K/uL Final  . RBC 06/24/2017 3.72* 3.87 - 5.11 MIL/uL Final  . Hemoglobin 06/24/2017 12.5  12.0 - 15.0 g/dL Final  . HCT 06/24/2017 36.1  36.0 - 46.0 % Final  . MCV 06/24/2017 97.0  78.0 - 100.0 fL Final  . MCH 06/24/2017 33.6  26.0 - 34.0 pg Final  . MCHC 06/24/2017 34.6  30.0 - 36.0 g/dL Final  . RDW 06/24/2017 12.9  11.5 - 15.5 % Final  . Platelets 06/24/2017 282  150 - 400 K/uL Final   Performed at [facility], [place] [address], [place], [place] [address]  Hospital Outpatient Visit on 06/16/2017  Component Date Value Ref Range Status  . aPTT 06/16/2017 34  24 - 36 seconds Final   Performed at [facility], [place] [address], [place], [place] [address]  . WBC 06/16/2017 6.0  4.0 - 10.5 K/uL Final  . RBC  06/16/2017 4.22  3.87 - 5.11 MIL/uL Final  . Hemoglobin 06/16/2017 13.8  12.0 - 15.0 g/dL Final  . HCT 06/16/2017 40.7  36.0 - 46.0 % Final  . MCV 06/16/2017 96.4  78.0 - 100.0 fL Final  . MCH 06/16/2017 32.7  26.0 - 34.0 pg Final  . MCHC 06/16/2017 33.9  30.0 - 36.0 g/dL Final  . RDW 06/16/2017 13.2  11.5 - 15.5 % Final  . Platelets 06/16/2017 340  150 - 400 K/uL Final   Performed at [facility], [place] [address], [place], [place] [address]  . Sodium 06/16/2017 142  135 - 145 mmol/L Final  . Potassium 06/16/2017 5.3* 3.5 - 5.1 mmol/L Final  . Chloride 06/16/2017 106  101 - 111 mmol/L Final  . CO2 06/16/2017 27  22 - 32 mmol/L Final  . Glucose, Bld 06/16/2017 107* 65 - 99 mg/dL Final  . BUN 06/16/2017 17  6 - 20 mg/dL Final  . Creatinine, Ser 06/16/2017 0.71  0.44 - 1.00 mg/dL Final  . Calcium 06/16/2017 9.7  8.9 - 10.3 mg/dL Final  . Total Protein 06/16/2017 8.2* 6.5 - 8.1 g/dL Final  . Albumin 06/16/2017 4.3  3.5 - 5.0 g/dL Final  . AST 06/16/2017 20  15 - 41 U/L Final  . ALT 06/16/2017 24  14 - 54 U/L Final  . Alkaline Phosphatase 06/16/2017 88  38 - 126 U/L Final  . Total Bilirubin 06/16/2017 0.9  0.3 - 1.2 mg/dL Final  . GFR calc non Af Amer 06/16/2017 >60  >60 mL/min Final  . GFR calc Af Amer 06/16/2017 >60  >60 mL/min Final   Comment: (NOTE) The eGFR has been calculated  using the CKD EPI equation. This calculation has not been validated in all clinical situations. eGFR's persistently <60 mL/min signify possible Chronic Kidney Disease.   Georgiann Hahn gap 06/16/2017 9  5 - 15 Final   Performed at [facility], [place] [address], [place], [place] [address]  . Prothrombin Time 06/16/2017 12.7  11.4 - 15.2 seconds Final  . INR 06/16/2017 0.96   Final   Performed at [facility], [place] [address], [place], [place] [address]  . ABO/RH(D) 06/16/2017 B POS   Final  . Antibody Screen 06/16/2017 NEG   Final  . Sample Expiration  06/16/2017 06/24/2017   Final  . Extend sample reason 06/16/2017    Final                   Value:NO TRANSFUSIONS OR PREGNANCY IN THE PAST 3 MONTHS Performed at [facility], [place] [address], [place], [place] [address]   . MRSA, PCR 06/16/2017 NEGATIVE  NEGATIVE Final  . Staphylococcus aureus 06/16/2017 POSITIVE* NEGATIVE Final   Comment: (NOTE) The Xpert SA Assay (FDA approved for NASAL specimens in patients 61 years of age and older), is one component of a comprehensive surveillance program. It is not intended to diagnose infection nor to guide or monitor treatment. Performed at [facility], [place] [address] [place], [place] [address]   . ABO/RH(D) 06/16/2017    Final                   Value:B POS Performed at [facility], [place] [address], [place], [place] [address]   Orders Only on 05/05/2017  Component Date Value Ref Range Status  . Glucose, Bld 05/05/2017 84  65 - 99 mg/dL Final   Comment: .            Fasting reference interval .   . BUN 05/05/2017 13  7 - 25 mg/dL Final  . Creat 05/05/2017 0.75  0.50 - 0.99 mg/dL Final   Comment: For patients >87 years of age, the reference limit for Creatinine is approximately 13% higher for people identified as African-American. .   . GFR, Est Non African American 05/05/2017 84  > OR = 60 mL/min/1.69m Final  . GFR, Est African American 05/05/2017 98  > OR = 60 mL/min/1.746mFinal  . BUN/Creatinine Ratio 0312/75/1700OT APPLICABLE  6 - 22 (calc) Final  . Sodium 05/05/2017 140  135 - 146 mmol/L Final  . Potassium 05/05/2017 4.4  3.5 - 5.3 mmol/L Final  . Chloride 05/05/2017 104  98 - 110 mmol/L Final  . CO2 05/05/2017 28  20 - 32 mmol/L Final  . Calcium 05/05/2017 9.7  8.6 - 10.4 mg/dL Final  . Total Protein 05/05/2017 7.4  6.1 - 8.1 g/dL Final  . Albumin 05/05/2017 4.6  3.6 - 5.1 g/dL Final  . Globulin 05/05/2017 2.8  1.9 - 3.7 g/dL (calc) Final  . AG Ratio 05/05/2017 1.6  1.0  - 2.5 (calc) Final  . Total Bilirubin 05/05/2017 0.7  0.2 - 1.2 mg/dL Final  . Alkaline phosphatase (APISO) 05/05/2017 81  33 - 130 U/L Final  . AST 05/05/2017 19  10 - 35 U/L Final  . ALT 05/05/2017 22  6 - 29 U/L Final  . WBC 05/05/2017 6.1  3.8 - 10.8 Thousand/uL Final  . RBC 05/05/2017 4.18  3.80 - 5.10 Million/uL Final  . Hemoglobin 05/05/2017 14.1  11.7 - 15.5 g/dL Final  .  HCT 05/05/2017 40.0  35.0 - 45.0 % Final  . MCV 05/05/2017 95.7  80.0 - 100.0 fL Final  . MCH 05/05/2017 33.7* 27.0 - 33.0 pg Final  . MCHC 05/05/2017 35.3  32.0 - 36.0 g/dL Final  . RDW 05/05/2017 12.9  11.0 - 15.0 % Final  . Platelets 05/05/2017 350  140 - 400 Thousand/uL Final  . MPV 05/05/2017 9.7  7.5 - 12.5 fL Final  . Neutro Abs 05/05/2017 4,014  1,500 - 7,800 cells/uL Final  . Lymphs Abs 05/05/2017 1,519  850 - 3,900 cells/uL Final  . WBC mixed population 05/05/2017 458  200 - 950 cells/uL Final  . Eosinophils Absolute 05/05/2017 67  15 - 500 cells/uL Final  . Basophils Absolute 05/05/2017 43  0 - 200 cells/uL Final  . Neutrophils Relative % 05/05/2017 65.8  % Final  . Total Lymphocyte 05/05/2017 24.9  % Final  . Monocytes Relative 05/05/2017 7.5  % Final  . Eosinophils Relative 05/05/2017 1.1  % Final  . Basophils Relative 05/05/2017 0.7  % Final  . Iron 05/05/2017 104  45 - 160 mcg/dL Final  . TIBC 05/05/2017 296  250 - 450 mcg/dL (calc) Final  . %SAT 05/05/2017 35  11 - 50 % (calc) Final  . Ferritin 05/05/2017 512* 20 - 288 ng/mL Final     X-Rays:Dg Knee Left Port  Result Date: 06/24/2017 CLINICAL DATA:  67 year old female with a history of severe left pain. Patient has a known fracture of the femur. EXAM: PORTABLE LEFT KNEE - 1-2 VIEW COMPARISON:  MRI 10/02/2016. No plain films available before the MR. FINDINGS: Surgical changes of left knee arthroplasty. There is a fracture involving the medial femoral condyle, with medial displacement of the fracture fragment approximately 6 mm. Joint effusion  present with small lucencies, either fat or air. IMPRESSION: Surgical changes of left knee arthroplasty with a perihardware fracture of the medial femoral condyle, and slight displacement. No plain films are available for comparison. Joint effusion with small internal lucencies, potentially representing early postoperative change. Electronically Signed   By: Corrie Mckusick D.O.   On: 06/24/2017 10:06    EKG: Orders placed or performed during the hospital encounter of 06/16/17  . EKG 12 lead  . EKG 12 lead     Hospital Course: Jade Cardenas is a 67 y.o. who was admitted to [facility]. They were brought to the operating room on 06/21/2017 and underwent Procedure(s): LEFT TOTAL KNEE ARTHROPLASTY AND RIGHT KNEE CORTISONE INJECTION.  Patient tolerated the procedure well and was later transferred to the recovery room and then to the orthopaedic floor for postoperative care.  They were given PO and IV analgesics for pain control following their surgery.  They were given 24 hours of postoperative antibiotics of  Anti-infectives (From admission, onward)   Start     Dose/Rate Route Frequency Ordered Stop   06/21/17 2200  vancomycin (VANCOCIN) IVPB 1000 mg/200 mL premix     1,000 mg 200 mL/hr over 60 Minutes Intravenous Every 12 hours 06/21/17 1402 06/21/17 2241   06/21/17 0800  vancomycin (VANCOCIN) IVPB 1000 mg/200 mL premix     1,000 mg 200 mL/hr over 60 Minutes Intravenous On call to O.R. 06/21/17 0800 06/21/17 1101     and started on DVT prophylaxis in the form of Xarelto.   PT and OT were ordered for total joint protocol.  Discharge planning consulted to help with postop disposition and equipment needs.  Patient had a difficult night on  the evening of surgery.  They started to get up OOB with therapy on day one. Hemovac drain was pulled without difficulty.  Continued to work with therapy into day two and progressed slowly due to pain.  Dressing was changed on day two and the incision was  clean and dry. Plain x-rays were ordered and show no movement of the prosthesis or intra-operative medial femoral condyle fracture.  Patient continued to work with therapy post op day 3 and 4. Incision was healing well.  Patient was seen in rounds and was ready to go home.   Diet: Cardiac diet Activity:WBAT Follow-up:in 2 weeks Disposition - Home Discharged Condition: stable   Discharge Instructions    Call MD / Call 911   Complete by:  As directed    If you experience chest pain or shortness of breath, CALL 911 and be transported to the hospital emergency room.  If you develope a fever above 101 F, pus (white drainage) or increased drainage or redness at the wound, or calf pain, call your surgeon's office.   Constipation Prevention   Complete by:  As directed    Drink plenty of fluids.  Prune juice may be helpful.  You may use a stool softener, such as Colace (over the counter) 100 mg twice a day.  Use MiraLax (over the counter) for constipation as needed.   Diet - low sodium heart healthy   Complete by:  As directed    Discharge instructions   Complete by:  As directed    Dr. Gaynelle Arabian Total Joint Specialist [facility] [address], [place], [place] [address] [phone]  TOTAL KNEE REPLACEMENT POSTOPERATIVE DIRECTIONS  Knee Rehabilitation, Guidelines Following Surgery  Results after knee surgery are often greatly improved when you follow the exercise, range of motion and muscle strengthening exercises prescribed by your doctor. Safety measures are also important to protect the knee from further injury. Any time any of these exercises cause you to have increased pain or swelling in your knee joint, decrease the amount until you are comfortable again and slowly increase them. If you have problems or questions, call your caregiver or physical therapist for advice.   HOME CARE INSTRUCTIONS  Remove items at home which could result in a fall. This includes throw  rugs or furniture in walking pathways.  ICE to the affected knee every three hours for 30 minutes at a time and then as needed for pain and swelling.  Continue to use ice on the knee for pain and swelling from surgery. You may notice swelling that will progress down to the foot and ankle.  This is normal after surgery.  Elevate the leg when you are not up walking on it.   Continue to use the breathing machine which will help keep your temperature down.  It is common for your temperature to cycle up and down following surgery, especially at night when you are not up moving around and exerting yourself.  The breathing machine keeps your lungs expanded and your temperature down. Do not place pillow under knee, focus on keeping the knee straight while resting  DIET You may resume your previous home diet once your are discharged from the hospital.  DRESSING / WOUND CARE / SHOWERING You may change your dressing every day with sterile gauze.  Please use good hand washing techniques before changing the dressing.  Do not use any lotions or creams on the incision until instructed by your surgeon. You may start showering  once you are discharged home but do not submerge the incision under water. Just pat the incision dry and apply a dry gauze dressing on daily. Change the surgical dressing daily and reapply a dry dressing each time.  ACTIVITY Walk with your walker as instructed. Use walker as long as suggested by your caregivers. Avoid periods of inactivity such as sitting longer than an hour when not asleep. This helps prevent blood clots.  You may resume a sexual relationship in one month or when given the OK by your doctor.  You may return to work once you are cleared by your doctor.  Do not drive a car for 6 weeks or until released by you surgeon.  Do not drive while taking narcotics.  WEIGHT BEARING Weight bearing as tolerated with assist device (walker, cane, etc) as directed, use it as long as  suggested by your surgeon or therapist, typically at least 4-6 weeks.  POSTOPERATIVE CONSTIPATION PROTOCOL Constipation - defined medically as fewer than three stools per week and severe constipation as less than one stool per week.  One of the most common issues patients have following surgery is constipation.  Even if you have a regular bowel pattern at home, your normal regimen is likely to be disrupted due to multiple reasons following surgery.  Combination of anesthesia, postoperative narcotics, change in appetite and fluid intake all can affect your bowels.  In order to avoid complications following surgery, here are some recommendations in order to help you during your recovery period.  Colace (docusate) - Pick up an over-the-counter form of Colace or another stool softener and take twice a day as long as you are requiring postoperative pain medications.  Take with a full glass of water daily.  If you experience loose stools or diarrhea, hold the colace until you stool forms back up.  If your symptoms do not get better within 1 week or if they get worse, check with your doctor.  Dulcolax (bisacodyl) - Pick up over-the-counter and take as directed by the product packaging as needed to assist with the movement of your bowels.  Take with a full glass of water.  Use this product as needed if not relieved by Colace only.   MiraLax (polyethylene glycol) - Pick up over-the-counter to have on hand.  MiraLax is a solution that will increase the amount of water in your bowels to assist with bowel movements.  Take as directed and can mix with a glass of water, juice, soda, coffee, or tea.  Take if you go more than two days without a movement. Do not use MiraLax more than once per day. Call your doctor if you are still constipated or irregular after using this medication for 7 days in a row.  If you continue to have problems with postoperative constipation, please contact the office for further assistance  and recommendations.  If you experience "the worst abdominal pain ever" or develop nausea or vomiting, please contact the office immediatly for further recommendations for treatment.  ITCHING  If you experience itching with your medications, try taking only a single pain pill, or even half a pain pill at a time.  You can also use Benadryl over the counter for itching or also to help with sleep.   TED HOSE STOCKINGS Wear the elastic stockings on both legs for three weeks following surgery during the day but you may remove then at night for sleeping.  MEDICATIONS See your medication summary on the "After Visit Summary" that the  nursing staff will review with you prior to discharge.  You may have some home medications which will be placed on hold until you complete the course of blood thinner medication.  It is important for you to complete the blood thinner medication as prescribed by your surgeon.  Continue your approved medications as instructed at time of discharge.  Take Xarelto for two and a half more weeks following discharge from the hospital, then discontinue Xarelto. Once the patient has completed the blood thinner regimen, then take a Baby 81 mg Aspirin daily for three more weeks.  Gabapentin 300 mg Protocol Take a 300 mg capsule three times a day for two weeks, Then a 300 mg capsule twice a day for two weeks, Then a 300 mg capsule once a day for two weeks, then discontinue the Gabapentin.  PRECAUTIONS If you experience chest pain or shortness of breath - call 911 immediately for transfer to the hospital emergency department.  If you develop a fever greater that 101 F, purulent drainage from wound, increased redness or drainage from wound, foul odor from the wound/dressing, or calf pain - CONTACT YOUR SURGEON.                                                   FOLLOW-UP APPOINTMENTS Make sure you keep all of your appointments after your operation with your surgeon and caregivers. You  should call the office at the above phone number and make an appointment for approximately two weeks after the date of your surgery or on the date instructed by your surgeon outlined in the "After Visit Summary".   RANGE OF MOTION AND STRENGTHENING EXERCISES  Rehabilitation of the knee is important following a knee injury or an operation. After just a few days of immobilization, the muscles of the thigh which control the knee become weakened and shrink (atrophy). Knee exercises are designed to build up the tone and strength of the thigh muscles and to improve knee motion. Often times heat used for twenty to thirty minutes before working out will loosen up your tissues and help with improving the range of motion but do not use heat for the first two weeks following surgery. These exercises can be done on a training (exercise) mat, on the floor, on a table or on a bed. Use what ever works the best and is most comfortable for you Knee exercises include:  Leg Lifts - While your knee is still immobilized in a splint or cast, you can do straight leg raises. Lift the leg to 60 degrees, hold for 3 sec, and slowly lower the leg. Repeat 10-20 times 2-3 times daily. Perform this exercise against resistance later as your knee gets better.  Quad and Hamstring Sets - Tighten up the muscle on the front of the thigh (Quad) and hold for 5-10 sec. Repeat this 10-20 times hourly. Hamstring sets are done by pushing the foot backward against an object and holding for 5-10 sec. Repeat as with quad sets.  Leg Slides: Lying on your back, slowly slide your foot toward your buttocks, bending your knee up off the floor (only go as far as is comfortable). Then slowly slide your foot back down until your leg is flat on the floor again. Angel Wings: Lying on your back spread your legs to the side as far apart as you  can without causing discomfort.  A rehabilitation program following serious knee injuries can speed recovery and prevent  re-injury in the future due to weakened muscles. Contact your doctor or a physical therapist for more information on knee rehabilitation.   IF YOU ARE TRANSFERRED TO A SKILLED REHAB FACILITY If the patient is transferred to a skilled rehab facility following release from the hospital, a list of the current medications will be sent to the facility for the patient to continue.  When discharged from the skilled rehab facility, please have the facility set up the patient's [place] prior to being released. Also, the skilled facility will be responsible for providing the patient with their medications at time of release from the facility to include their pain medication, the muscle relaxants, and their blood thinner medication. If the patient is still at the rehab facility at time of the two week follow up appointment, the skilled rehab facility will also need to assist the patient in arranging follow up appointment in our office and any transportation needs.  MAKE SURE YOU:  Understand these instructions.  Get help right away if you are not doing well or get worse.    Pick up stool softner and laxative for home use following surgery while on pain medications. Do not submerge incision under water. Please use good hand washing techniques while changing dressing each day. May shower starting three days after surgery. Please use a clean towel to pat the incision dry following showers. Continue to use ice for pain and swelling after surgery. Do not use any lotions or creams on the incision until instructed by your surgeon.   Increase activity slowly as tolerated   Complete by:  As directed    TED hose   Complete by:  As directed    Use stockings (TED hose) for 3 weeks on operative leg(s).  You may remove them at night for sleeping.     Allergies as of 06/25/2017      Reactions   Erythromycin Hives, Swelling, Other (See Comments)   All Mycins; throat swells shut and she gets  "really hot"   Iodinated Diagnostic Agents Swelling   CT contrast; throat swelling, difficulty breathing.  In [place] "many years ago."   Ace Inhibitors Cough   Morphine And Related    Keflex [cephalexin] Hives   hives   Macrobid [nitrofurantoin] Other (See Comments)   dizziness      Medication List    STOP taking these medications   folic acid 1 MG tablet Commonly known as:  FOLVITE   methotrexate 50 MG/2ML injection   multivitamin tablet   Vitamin D 2000 units tablet   VSL#3 PO     TAKE these medications   acetaminophen 650 MG CR tablet Commonly known as:  TYLENOL Take 650 mg by mouth every 8 (eight) hours as needed for pain.   amLODipine 5 MG tablet Commonly known as:  NORVASC Take 1 tablet (5 mg total) by mouth daily.   clobetasol ointment 0.05 % Commonly known as:  TEMOVATE APPLY TWICE A DAY What changed:    how to take this  when to take this   esomeprazole 40 MG capsule Commonly known as:  NEXIUM Take 1 capsule (40 mg total) by mouth daily before breakfast.   gabapentin 300 MG capsule Commonly known as:  NEURONTIN Take 1 capsule (300 mg total) by mouth 3 (three) times daily. Gabapentin 300 mg Protocol Take a 300 mg capsule three times a day  for two weeks, Then a 300 mg capsule twice a day for two weeks, Then a 300 mg capsule once a day for two weeks, then discontinue the Gabapentin.   Golimumab 50 MG/0.5ML Soaj Commonly known as:  SIMPONI INJECT 50MG (1 PEN) UNDER THE SKIN ONCE MONTHLY   HYDROmorphone 2 MG tablet Commonly known as:  DILAUDID Take 1-2 tablets (2-4 mg total) by mouth every 6 (six) hours as needed for severe pain.   losartan 100 MG tablet Commonly known as:  COZAAR Take 1 tablet (100 mg total) by mouth daily.   lovastatin 20 MG tablet Commonly known as:  MEVACOR Take 1 tablet (20 mg total) by mouth at bedtime. What changed:  when to take this   methocarbamol 500 MG tablet Commonly known as:  ROBAXIN Take 1 tablet (500 mg  total) by mouth every 6 (six) hours as needed for muscle spasms.   rivaroxaban 10 MG Tabs tablet Commonly known as:  XARELTO Take 1 tablet (10 mg total) by mouth daily with breakfast.   traMADol 50 MG tablet Commonly known as:  ULTRAM Take 1-2 tablets (50-100 mg total) by mouth every 6 (six) hours as needed for moderate pain (give if Po Oxycodone is insufficient for pain).   Tuberculin-Allergy Syringes 28G X 1/2" 1 ML Misc Commonly known as:  B-D ALLERGY SYRINGE 1CC/28G USE AS DIRECTED ONCE WEEKLY   TYLENOL PM EXTRA STRENGTH PO Take 2 tablets by mouth at bedtime as needed. For sleep      Follow-up Information    Gaynelle Arabian, MD. Schedule an appointment as soon as possible for a visit on 07/06/2017.   Specialty:  Orthopedic Surgery Contact information: [address] [place]  [address] [phone]        [facility] - Dme Follow up.   Why:  [facility] Contact information: [address] [place] [address] [phone]           Signed: Ardeen Jourdain, PA-C Orthopaedic Surgery 06/27/2017, 7:01 PM

## 2017-06-28 DIAGNOSIS — Z96652 Presence of left artificial knee joint: Secondary | ICD-10-CM | POA: Insufficient documentation

## 2017-06-30 ENCOUNTER — Encounter: Payer: Self-pay | Admitting: Internal Medicine

## 2017-06-30 NOTE — Telephone Encounter (Signed)
So sorry about the complication of surgery .  if your pharmacy has not recalled the lot of  Losartan ( contact them about the  Status of your supply lot number )  Then can remain on med  .   The medicine doesn't cause cancer but there has been a contaminant in the manufacturing  Of the product at certain factories that  Is the potentially cancer  Risk  Chemical .   So call your pharmacy to see if your  Lot  is on recall

## 2017-07-13 ENCOUNTER — Other Ambulatory Visit: Payer: Self-pay | Admitting: *Deleted

## 2017-07-13 MED ORDER — GOLIMUMAB 50 MG/0.5ML ~~LOC~~ SOAJ: SUBCUTANEOUS | 2 refills | Status: DC

## 2017-07-13 NOTE — Telephone Encounter (Signed)
Refill request received via fax  Last visit: 02/16/2017 Next visit: 07/28/2017 Labs: 06/16/17 stable TB Skin Test 12/03/16  Negative  Okay to refill per Dr. Estanislado Pandy

## 2017-07-15 NOTE — Progress Notes (Signed)
Office Visit Note  Patient: Jade Cardenas             Date of Birth: 1950/10/22           MRN: [id number]             PCP: [name], MD Referring: [name], MD Visit Date: 07/28/2017 Occupation: @GUAROCC @    Subjective:  Left knee joint pain.   History of Present Illness: Jade Cardenas is a 67 y.o. female with history of psoriatic arthritis, fibromyalgia, DDD and osteoarthritis.  She had left total knee replacement by Dr. Maureen Ralphs about 5 weeks ago.  Patient states during the procedure she fractured her femur.  She is having difficult time recovering from her knee replacement.  She was off Simponi a week prior to the injection and she restarted Symphony 3 weeks later.  She had a flare of her arthritis during that time.  She has resumed Simponi now and also has resumed methotrexate.  Arthritis is better controlled now.  He has had no flare of psoriasis.  She continues to have some discomfort and fatigue from fibromyalgia.  The neck and lower back continues to give her some discomfort.  Activities of Daily Living:  Patient reports morning stiffness for 2  hours.   Patient Reports nocturnal pain.  Difficulty dressing/grooming: Denies Difficulty climbing stairs: Reports Difficulty getting out of chair: Denies Difficulty using hands for taps, buttons, cutlery, and/or writing: Reports   Review of Systems  Constitutional: Positive for activity change and fatigue. Negative for night sweats, weight gain and weight loss.  HENT: Positive for mouth dryness. Negative for mouth sores, trouble swallowing, trouble swallowing and nose dryness.   Eyes: Positive for dryness. Negative for pain, redness and visual disturbance.  Respiratory: Negative for cough, shortness of breath and difficulty breathing.   Cardiovascular: Negative for chest pain, palpitations, hypertension, irregular heartbeat and swelling in legs/feet.  Gastrointestinal: Negative for blood in stool, constipation  and diarrhea.  Endocrine: Negative for increased urination.  Genitourinary: Negative for difficulty urinating and vaginal dryness.  Musculoskeletal: Positive for arthralgias, joint pain, joint swelling, muscle weakness, morning stiffness and muscle tenderness. Negative for myalgias and myalgias.  Skin: Negative for color change, rash, hair loss, skin tightness, ulcers and sensitivity to sunlight.  Allergic/Immunologic: Negative for susceptible to infections.  Neurological: Positive for numbness. Negative for dizziness, memory loss, night sweats and weakness.  Hematological: Negative for bruising/bleeding tendency and swollen glands.  Psychiatric/Behavioral: Positive for sleep disturbance. Negative for depressed mood. The patient is not nervous/anxious.     PMFS History:  Patient Active Problem List   Diagnosis Date Noted  . OA (osteoarthritis) of knee 06/21/2017  . Varicose veins of bilateral lower extremities with other complications 38/11/1749  . DDD (degenerative disc disease), lumbar 10/19/2016  . Goiter 08/28/2016  . Family history of malignant neoplasm of digestive organ 08/27/2016  . 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 Fibromyalgia 05/17/2016  . High risk medication use 02/18/2016  . DJD (degenerative joint disease), cervical 02/18/2016  . Heel spur 09/29/2015  . Ingrown toenail 09/29/2015  . Foraminal stenosis of cervical region 07/30/2015  . Microcytic hypochromic anemia 05/22/2015  . Tailor's bunion, left 01/18/2015  . H/O hiatal hernia   . Bilateral primary osteoarthritis of knee 12/15/2013  . Iron deficiency 12/15/2013  . Psoriatic arthritis ([place]) 12/15/2013  . History of foot fracture 12/15/2013  . Dermatitis 10/17/2013  . Arthritis 05/31/2013  . 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  Skin lesion 12/09/2012  . Tinnitus 06/23/2012  . Scalp lesion 01/21/2012  . Headache(784.0) 01/21/2012  . Hx of psoriasis 01/21/2012  . Weight gain  01/21/2012  . Ankylosing spondylitis  possible 09/03/2011  . Hx of transfusion 09/03/2011  . S/P lumbar fusion 6 13 09/03/2011  . Lumbar stenosis with neurogenic claudication 08/05/2011  . LBP (low back pain) 01/17/2011  . ALLERGIC RHINITIS 02/19/2010  . GERD 02/19/2010  . ARTHRITIS 02/19/2010  . SPINAL STENOSIS, CERVICAL 02/19/2010  . OSTEOPENIA 02/19/2010  . RASH AND OTHER NONSPECIFIC SKIN ERUPTION 12/07/2007  . IBS 04/28/2007  . History of gastric ulcer 02/09/2007  . COLONIC POLYPS, HX OF 02/09/2007  . CHOLECYSTECTOMY, HX OF 02/09/2007  . Hyperlipidemia 12/29/2006  . Essential hypertension 12/29/2006    Past Medical History:  Diagnosis Date  . Anemia    iron def  . Arthritis    rheumatoid and osteo now flt to be psoriateic   . Arthritis    psoreratic, osteoarthritis  . Broken toe   . Bruises easily   . CHOLECYSTECTOMY, HX OF   . Chronic back pain   . COLONIC POLYPS, HX OF 02/09/2007  . Cough, persistent 01/21/2012   poss from acei   . 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 Fibromyalgia    doesn't require meds  . GASTRIC ULCER, ACUTE, HEMORRHAGE, HX OF 02/09/2007  . GERD (gastroesophageal reflux disease)    takes Nexium daily  . Goiter   . H/O hiatal hernia   . Headache(784.0)    occasionally  . History of bronchitis   . Hx of seasonal allergies    takes Claritin daily  . HYPERLIPIDEMIA 12/29/2006   takes Lovastatin nightly  . HYPERTENSION 12/29/2006   takes Enalapril daily  . IBS 04/28/2007  . Insomnia    related to pain;takes Flexeril and Tylenol PM nightly  . Joint pain   . Joint swelling   . Palpitations 02/09/2007  . PONV (postoperative nausea and vomiting)    NAUSEA  . REDUCTION MAMMOPLASTY, HX OF 02/09/2007  . Rheumatoid arthritis(714.0) 12/29/2006  . Right bundle branch block 02/09/2007  . S/P lumbar fusion 6 13 09/03/2011   L4 L5  posterior   . SYNCOPE 12/07/2007  . UNS ADVRS EFF OTH RX MEDICINAL\T\BIOLOGICAL SBSTNC 02/09/2007    Family History    Problem Relation Age of Onset  . Diabetes Mother   . Colon cancer Mother        x2  . Arthritis Mother   . Hypertension Mother   . Endometrial cancer Mother   . Stroke Father   . Heart disease Father   . Colon cancer Father   . Arthritis Brother   . Hypertension Brother   . Hyperlipidemia Brother   . Infertility Brother   . Other Unknown        DISH brother  . Hypertension Maternal Grandmother   . Stroke Maternal Grandmother   . Anesthesia problems Neg Hx   . Hypotension Neg Hx   . Malignant hyperthermia Neg Hx   . Pseudochol deficiency Neg Hx    Past Surgical History:  Procedure Laterality Date  . BACK SURGERY  12+yrs ago   Synovial Cyst removal  . BACK SURGERY  6/13   lumbar L4-L5 fusion, lamenectomy  . bone spur  6-78yrs ago   right ankle  . BUNIONECTOMY  10/03/14   right foot  . CERVICAL FUSION     2 12   . CHOLECYSTECTOMY  1996  . JOINT REPLACEMENT    .  KNEE ARTHROPLASTY    . POSTERIOR CERVICAL LAMINECTOMY Left 07/30/2015   Procedure: Laminectomy and Foraminotomy - left - Cervical two -Cervical three;  Surgeon: Earnie Larsson, MD;  Location: [facility];  Service: Neurosurgery;  Laterality: Left;  . REDUCTION MAMMAPLASTY Bilateral 1989  . TONSILLECTOMY     as a child  . TOTAL ABDOMINAL HYSTERECTOMY W/ BILATERAL SALPINGOOPHORECTOMY  1986  . 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 She appears well-developed and well-nourished.  HENT:  Head: Normocephalic and atraumatic.  Eyes: Conjunctivae and EOM are normal.  Neck: Normal range of motion.  Cardiovascular: Normal rate, regular rhythm, normal heart sounds and intact distal pulses.  Pulmonary/Chest: Effort normal and breath sounds normal.  Abdominal: Soft. Bowel sounds are normal.  Lymphadenopathy:    She has no cervical adenopathy.  Neurological: She is alert and oriented to person, place, and time.  Skin: Skin is warm and dry. Capillary refill takes less than 2 seconds.  Psychiatric: She has a normal mood and affect. Her behavior is normal.  Nursing note and vitals reviewed.    Musculoskeletal Exam: C-spine thoracic lumbar spine limited range of motion.  Shoulder joints elbow joints wrist joint MCPs PIPs DIPs were in good range of motion with no synovitis.  She has left total knee replacement which is warm to touch.  Right knee joint is in good range of motion.  She has some generalized hyperalgesia from fibromyalgia. CDAI Exam: CDAI Homunculus Exam:   Tenderness:  LLE: tibiofemoral  Joint Counts:  CDAI Tender Joint count: 1 CDAI Swollen Joint count: 0  Global Assessments:  Patient Global Assessment: 2 Provider Global Assessment: 2  CDAI Calculated Score: 5    Investigation: No additional findings.TB skin test: 12/03/2016 Negative  CBC Latest Ref Rng & Units 06/24/2017 06/23/2017 06/22/2017  WBC 4.0 - 10.5 K/uL 10.9(H) 10.0 12.0(H)  Hemoglobin 12.0 - 15.0 g/dL 12.5 12.2 12.7  Hematocrit 36.0 - 46.0 % 36.1 35.7(L) 36.4  Platelets 150 - 400 K/uL 282 292 315   CMP Latest Ref Rng & Units 06/23/2017 06/22/2017 06/16/2017  Glucose 65 - 99 mg/dL 115(H) 148(H) 107(H)  BUN 6 - 20 mg/dL 12 11 17   Creatinine 0.44 - 1.00 mg/dL 0.59 0.60 0.71  Sodium 135 - 145 mmol/L 138 143 142  Potassium 3.5 - 5.1 mmol/L 4.0 4.5 5.3(H)  Chloride 101 - 111 mmol/L 102  106 106  CO2 22 - 32 mmol/L 24 26 27   Calcium 8.9 - 10.3 mg/dL 9.0 9.5 9.7  Total Protein 6.5 - 8.1 g/dL - - 8.2(H)  Total Bilirubin 0.3 - 1.2 mg/dL - - 0.9  Alkaline Phos 38 - 126 U/L - - 88  AST 15 - 41 U/L - - 20  ALT 14 - 54  U/L - - 24    Imaging: No results found.  Speciality Comments: No specialty comments available.    Procedures:  No procedures performed Allergies: Erythromycin; Iodinated diagnostic agents; Ace inhibitors; Morphine and related; Keflex [cephalexin]; and Macrobid [nitrofurantoin]   Assessment / Plan:     Visit Diagnoses: Psoriatic arthritis (HCC)-patient has no synovitis on examination.  She states she had a flare while she was off Simponi and methotrexate.  She has resumed medications after total knee replacement.  Today and then every 3 months.  High risk medication use - Simponi subcu monthly, methotrexate 0.3 mL subcu weekly, folic acid 1 mg p.o. daily ( [place] and [place] caused diarrhea) - Plan: CBC with Differential/Platelet, COMPLETE METABOLIC PANEL WITH GFR  Hx of psoriasis-she has no active psoriasis lesions today.  Primary osteoarthritis of right knee-chronic discomfort.  Total knee replacement status, left-she had left total knee replacement by Dr. Maureen Ralphs from which she is recovering gradually.  She also required a left femur fracture at the time of surgery and the recovery has been slow.  I have advised her to schedule a DEXA through her GYN.  I will also check her vitamin D level today.  Use of calcium and vitamin D was discussed.  Fibromyalgia-her fibromyalgia is flaring with increased generalized pain and fatigue.  DDD (degenerative disc disease), cervical - S/P fusion.  She has limited range of motion.  DDD (degenerative disc disease), lumbar - S/P fusion.  She has limited range of motion some discomfort.  Chronic fatigue  Iron deficiency - Dr. Irene Limbo, hematologist   H/O hiatal hernia  History of IBS  History of  hyperlipidemia  History of gastric ulcer - f/u by Dr. [name]   Other fatigue - Plan: Iron, TIBC and Ferritin Panel, VITAMIN D 25 Hydroxy (Vit-D Deficiency, Fractures)    Orders: Orders Placed This Encounter  Procedures  . CBC with Differential/Platelet  . COMPLETE METABOLIC PANEL WITH GFR  . Iron, TIBC and Ferritin Panel  . VITAMIN D 25 Hydroxy (Vit-D Deficiency, Fractures)   No orders of the defined types were placed in this encounter.   Face-to-face time spent with patient was 30 minutes. >50% of time was spent in counseling and coordination of care.  Follow-Up Instructions: Return in about 5 months (around 12/28/2017) for Psoriatic arthritis, Osteoarthritis, FMS, DDD.   Bo Merino, MD  Note - This record has been created using Editor, commissioning.  Chart creation errors have been sought, but may not always  have been located. Such creation errors do not reflect on  the standard of medical care.

## 2017-07-28 ENCOUNTER — Encounter: Payer: Self-pay | Admitting: Rheumatology

## 2017-07-28 ENCOUNTER — Ambulatory Visit (INDEPENDENT_AMBULATORY_CARE_PROVIDER_SITE_OTHER): Payer: Medicare Other | Admitting: Rheumatology

## 2017-07-28 VITALS — BP 126/69 | HR 98 | Resp 18 | Ht 65.0 in | Wt 201.0 lb

## 2017-07-28 DIAGNOSIS — L4050 Arthropathic psoriasis, unspecified: Secondary | ICD-10-CM

## 2017-07-28 DIAGNOSIS — Z8711 Personal history of peptic ulcer disease: Secondary | ICD-10-CM

## 2017-07-28 DIAGNOSIS — Z79899 Other long term (current) drug therapy: Secondary | ICD-10-CM | POA: Diagnosis not present

## 2017-07-28 DIAGNOSIS — M797 Fibromyalgia: Secondary | ICD-10-CM | POA: Diagnosis not present

## 2017-07-28 DIAGNOSIS — M51369 Other intervertebral disc degeneration, lumbar region without mention of lumbar back pain or lower extremity pain: Secondary | ICD-10-CM

## 2017-07-28 DIAGNOSIS — R5382 Chronic fatigue, unspecified: Secondary | ICD-10-CM

## 2017-07-28 DIAGNOSIS — M5136 Other intervertebral disc degeneration, lumbar region: Secondary | ICD-10-CM

## 2017-07-28 DIAGNOSIS — M5030 Other cervical disc degeneration, unspecified cervical region: Secondary | ICD-10-CM | POA: Diagnosis not present

## 2017-07-28 DIAGNOSIS — Z96652 Presence of left artificial knee joint: Secondary | ICD-10-CM

## 2017-07-28 DIAGNOSIS — M1711 Unilateral primary osteoarthritis, right knee: Secondary | ICD-10-CM

## 2017-07-28 DIAGNOSIS — E611 Iron deficiency: Secondary | ICD-10-CM | POA: Diagnosis not present

## 2017-07-28 DIAGNOSIS — Z8719 Personal history of other diseases of the digestive system: Secondary | ICD-10-CM | POA: Diagnosis not present

## 2017-07-28 DIAGNOSIS — R5383 Other fatigue: Secondary | ICD-10-CM

## 2017-07-28 DIAGNOSIS — Z8639 Personal history of other endocrine, nutritional and metabolic disease: Secondary | ICD-10-CM

## 2017-07-28 DIAGNOSIS — Z872 Personal history of diseases of the skin and subcutaneous tissue: Secondary | ICD-10-CM | POA: Diagnosis not present

## 2017-07-28 NOTE — Patient Instructions (Signed)
Standing Labs We placed an order today for your standing lab work.    Please come back and get your standing labs in September and every 3 months   We have open lab Monday through Friday from 8:30-11:30 AM and 1:30-4:00 PM  at the office of Dr. Leviticus Harton.   You may experience shorter wait times on Monday and Friday afternoons. The office is located at [address], [address], [place],  [address] No appointment is necessary.   Labs are drawn by [facility].  You may receive a bill from [facility] for your lab work. If you have any questions regarding directions or hours of operation,  please call [phone].    

## 2017-07-29 LAB — VITAMIN D 25 HYDROXY (VIT D DEFICIENCY, FRACTURES): Vit D, 25-Hydroxy: 33 ng/mL (ref 30–100)

## 2017-07-29 LAB — CBC WITH DIFFERENTIAL/PLATELET
Basophils Absolute: 42 cells/uL (ref 0–200)
Basophils Relative: 0.8 %
Eosinophils Absolute: 42 cells/uL (ref 15–500)
Eosinophils Relative: 0.8 %
HCT: 37.3 % (ref 35.0–45.0)
Hemoglobin: 13.1 g/dL (ref 11.7–15.5)
Lymphs Abs: 1763 cells/uL (ref 850–3900)
MCH: 32.5 pg (ref 27.0–33.0)
MCHC: 35.1 g/dL (ref 32.0–36.0)
MCV: 92.6 fL (ref 80.0–100.0)
MPV: 10.1 fL (ref 7.5–12.5)
Monocytes Relative: 6.8 %
Neutro Abs: 3000 cells/uL (ref 1500–7800)
Neutrophils Relative %: 57.7 %
Platelets: 380 10*3/uL (ref 140–400)
RBC: 4.03 10*6/uL (ref 3.80–5.10)
RDW: 13.1 % (ref 11.0–15.0)
Total Lymphocyte: 33.9 %
WBC mixed population: 354 cells/uL (ref 200–950)
WBC: 5.2 10*3/uL (ref 3.8–10.8)

## 2017-07-29 LAB — IRON,TIBC AND FERRITIN PANEL
%SAT: 26 % (calc) (ref 11–50)
Ferritin: 548 ng/mL — ABNORMAL HIGH (ref 20–288)
Iron: 68 ug/dL (ref 45–160)
TIBC: 263 mcg/dL (calc) (ref 250–450)

## 2017-07-29 LAB — COMPLETE METABOLIC PANEL WITH GFR
AG Ratio: 1.4 (calc) (ref 1.0–2.5)
ALT: 19 U/L (ref 6–29)
AST: 20 U/L (ref 10–35)
Albumin: 4.4 g/dL (ref 3.6–5.1)
Alkaline phosphatase (APISO): 107 U/L (ref 33–130)
BUN: 14 mg/dL (ref 7–25)
CO2: 24 mmol/L (ref 20–32)
Calcium: 9.9 mg/dL (ref 8.6–10.4)
Chloride: 103 mmol/L (ref 98–110)
Creat: 0.7 mg/dL (ref 0.50–0.99)
GFR, Est African American: 105 mL/min/{1.73_m2} (ref >=60)
GFR, Est Non African American: 91 mL/min/{1.73_m2} (ref >=60)
Globulin: 3.1 g/dL (calc) (ref 1.9–3.7)
Glucose, Bld: 107 mg/dL — ABNORMAL HIGH (ref 65–99)
Potassium: 4.3 mmol/L (ref 3.5–5.3)
Sodium: 138 mmol/L (ref 135–146)
Total Bilirubin: 1 mg/dL (ref 0.2–1.2)
Total Protein: 7.5 g/dL (ref 6.1–8.1)

## 2017-07-29 NOTE — Progress Notes (Signed)
See note above

## 2017-07-29 NOTE — Progress Notes (Signed)
Vit D is low normal .Please, advise Vit D 2000 U q d. Ferritin is high. Please, notify Pt and fax results to her PCP.

## 2017-08-03 ENCOUNTER — Other Ambulatory Visit: Payer: Self-pay | Admitting: Obstetrics & Gynecology

## 2017-08-03 ENCOUNTER — Telehealth: Payer: Self-pay | Admitting: Obstetrics & Gynecology

## 2017-08-03 ENCOUNTER — Encounter: Payer: Self-pay | Admitting: Obstetrics & Gynecology

## 2017-08-03 DIAGNOSIS — Z8781 Personal history of (healed) traumatic fracture: Secondary | ICD-10-CM

## 2017-08-03 DIAGNOSIS — Z139 Encounter for screening, unspecified: Secondary | ICD-10-CM

## 2017-08-03 DIAGNOSIS — M8580 Other specified disorders of bone density and structure, unspecified site: Secondary | ICD-10-CM

## 2017-08-03 NOTE — Telephone Encounter (Signed)
Order for BMD to the [place] placed.Left detailed message for patient, okay per ROI. Advised BMD has been ordered and [facility] to [facility] with her mammogram.   Routing to provider for final review. Patient agreeable to disposition. Will close encounter.

## 2017-08-03 NOTE — Telephone Encounter (Signed)
Patient sent the following correspondence through [place]. Routing to triage to assist patient with request.  Message   ----- Message from [place], Generic sent at 08/03/2017 9:59 AM EDT -----    Hi Dr. Sabra Heck.Marland KitchenMarland KitchenI am recovering from knee replacement surgery where my femur was fractured during the procedure. Dr. Wynelle Link (the surgeon) as well as Dr. Estanislado Pandy think I should have a bone density test. Can you please order one so I can get it done when I go for my mammogram later this month at [facility]. Thanks very much!    Jade Cardenas

## 2017-09-07 ENCOUNTER — Ambulatory Visit: Payer: Medicare Other | Admitting: Podiatry

## 2017-09-07 ENCOUNTER — Ambulatory Visit: Payer: Self-pay

## 2017-09-07 DIAGNOSIS — M722 Plantar fascial fibromatosis: Secondary | ICD-10-CM

## 2017-09-07 DIAGNOSIS — M7731 Calcaneal spur, right foot: Secondary | ICD-10-CM

## 2017-09-08 NOTE — Progress Notes (Signed)
Subjective: 67 year old female presents the office today for concerns of right heel pain on her right heel.  She states that she recently had a knee replacement left side and she did become more active and she is reported right heel pain.  Denies any recent injury or trauma to the right side.  She is asking for steroid injection for the right heel.  Denies any recent swelling or redness that she has noticed.  She is also started to notice a heel spur in the back of her heel.  No recent treatment.  She is currently on physical therapy for her left knee. Denies any systemic complaints such as fevers, chills, nausea, vomiting. No acute changes since last appointment, and no other complaints at this time.   Objective: AAO x3, NAD DP/PT pulses palpable bilaterally, CRT less than 3 seconds There is tenderness palpation of the plantar medial tubercle of the calcaneus at the insertion of plantar fashion the right side.  There is no pain with lateral compression of the calcaneus.  Posterior spur is present.  No pain to this area there is no pain along the Achilles tendon.  There is no other areas of tenderness identified at this time there is no edema, erythema, increased warmth No open lesions or pre-ulcerative lesions.  No pain with calf compression, swelling, warmth, erythema  Assessment: Right heel pain, plantar fasciitis requesting steroid injection.  Plan: -All treatment options discussed with the patient including all alternatives, risks, complications.  -Steroid injection was performed.  See procedure note below.  Continue stretching, icing daily.  She has a plantar fascial brace from before at home though her start to wear.  Also she is going to physical therapy for her left knee and a prescription written for them to start therapy on the right heel as well. -Patient encouraged to call the office with any questions, concerns, change in symptoms.   Procedure: Injection Tendon/Ligament Discussed  alternatives, risks, complications and verbal consent was obtained.  Location: Right plantar fascia at the glabrous junction; medial approach. Skin Prep: Alcohol. Injectate: 0.5cc 0.5% marcaine plain, 0.5 cc 2% lidocaine plain and, 1 cc kenalog 10. Disposition: Patient tolerated procedure well. Injection site dressed with a band-aid.  Post-injection care was discussed and return precautions discussed.   Trula Slade DPM

## 2017-09-13 ENCOUNTER — Other Ambulatory Visit: Payer: Self-pay | Admitting: Rheumatology

## 2017-09-13 NOTE — Telephone Encounter (Signed)
Last Visit: 07/28/17 Next Visit: 12/29/17 Labs: 07/28/17 cbc/cmp wnl TB Skin Test 12/03/16 Neg   Okay to refill per Dr. Estanislado Pandy

## 2017-09-15 ENCOUNTER — Ambulatory Visit
Admission: RE | Admit: 2017-09-15 | Discharge: 2017-09-15 | Disposition: A | Discharge status: Home / Self Care | Payer: Medicare Other | Source: Ambulatory Visit | Attending: Obstetrics & Gynecology | Admitting: Obstetrics & Gynecology

## 2017-09-15 DIAGNOSIS — M8580 Other specified disorders of bone density and structure, unspecified site: Secondary | ICD-10-CM

## 2017-09-15 DIAGNOSIS — Z139 Encounter for screening, unspecified: Secondary | ICD-10-CM

## 2017-09-15 DIAGNOSIS — Z8781 Personal history of (healed) traumatic fracture: Secondary | ICD-10-CM

## 2017-09-28 ENCOUNTER — Ambulatory Visit: Payer: Medicare Other | Admitting: Podiatry

## 2017-09-28 ENCOUNTER — Encounter: Payer: Self-pay | Admitting: Podiatry

## 2017-09-28 ENCOUNTER — Ambulatory Visit (INDEPENDENT_AMBULATORY_CARE_PROVIDER_SITE_OTHER): Payer: Medicare Other

## 2017-09-28 DIAGNOSIS — M7731 Calcaneal spur, right foot: Secondary | ICD-10-CM

## 2017-09-28 DIAGNOSIS — M722 Plantar fascial fibromatosis: Secondary | ICD-10-CM | POA: Diagnosis not present

## 2017-09-29 NOTE — Progress Notes (Signed)
Subjective: Jade Cardenas presents to the office today for follow-up of right heel pain. She states the injection did not help much and she is still having pain. She is going to PT as well and they are using the laser for the foot but not that helpful. She denies any recent injury. She brings in her braces that she had previously and wants [place] to show her how to put them on as she cannot remember.  Denies any systemic complaints such as fevers, chills, nausea, vomiting. No acute changes since last appointment, and no other complaints at this time.   Objective: AAO x3, NAD DP/PT pulses palpable bilaterally, CRT less than 3 seconds There is continued tenderness to palpation along the plantar medial tubercle of the calcaneus at the insertion of plantar fascia on the right foot as well as the plantar central aspect. There is no pain along the course of the plantar fascia within the arch of the foot. Plantar fascia appears to be intact. There is no pain with lateral compression of the calcaneus or pain with vibratory sensation. There is no pain along the course or insertion of the achilles tendon. No other areas of tenderness to bilateral lower extremities. Negative tinel sign.  No open lesions or pre-ulcerative lesions.  No pain with calf compression, swelling, warmth, erythema  Assessment: Right heel pain, plantar fasciitis  Plan: -All treatment options discussed with the patient including all alternatives, risks, complications.  -X-rays were obtained and reviewed with the patient. No definitive evidence of acute fracture. Heel spurs present. Arthritic changes present as well.  -Discussed another steroid injection and she would like to try. See procedure not below.  -We showed her how to wear the plantar fascial braces -Continue PT. Can try iontophoresis. She is going to discuss with her therapist.  -Discussed inserts and wearing supportive shoes.  -Patient encouraged to call the office with any questions,  concerns, change in symptoms.   Procedure: Injection Tendon/Ligament Discussed alternatives, risks, complications and verbal consent was obtained.  Location: Right plantar fascia at the glabrous junction; medial approach. Skin Prep: Alcohol. Injectate: 0.5cc 0.5% marcaine plain, 0.5 cc 2% lidocaine plain and, 1 cc kenalog 10, 0.5 cc dexamethasone phospate Disposition: Patient tolerated procedure well. Injection site dressed with a band-aid.  Post-injection care was discussed and return precautions discussed.   Trula Slade DPM

## 2017-09-30 MED ORDER — NONFORMULARY OR COMPOUNDED ITEM: 1.0000 g | 0 refills | Status: DC

## 2017-09-30 NOTE — Addendum Note (Signed)
Addended by: Roney Jaffe on: 09/30/2017 04:40 PM   Modules accepted: Orders

## 2017-11-03 ENCOUNTER — Ambulatory Visit (INDEPENDENT_AMBULATORY_CARE_PROVIDER_SITE_OTHER): Payer: Medicare Other | Admitting: Internal Medicine

## 2017-11-03 ENCOUNTER — Encounter: Payer: Self-pay | Admitting: Internal Medicine

## 2017-11-03 VITALS — BP 128/86 | HR 75 | Temp 98.0°F | Ht 63.5 in | Wt 201.3 lb

## 2017-11-03 DIAGNOSIS — L4050 Arthropathic psoriasis, unspecified: Secondary | ICD-10-CM

## 2017-11-03 DIAGNOSIS — Z96652 Presence of left artificial knee joint: Secondary | ICD-10-CM

## 2017-11-03 DIAGNOSIS — Z79899 Other long term (current) drug therapy: Secondary | ICD-10-CM

## 2017-11-03 DIAGNOSIS — Z23 Encounter for immunization: Secondary | ICD-10-CM | POA: Diagnosis not present

## 2017-11-03 DIAGNOSIS — R739 Hyperglycemia, unspecified: Secondary | ICD-10-CM

## 2017-11-03 DIAGNOSIS — E785 Hyperlipidemia, unspecified: Secondary | ICD-10-CM

## 2017-11-03 DIAGNOSIS — E049 Nontoxic goiter, unspecified: Secondary | ICD-10-CM

## 2017-11-03 DIAGNOSIS — I10 Essential (primary) hypertension: Secondary | ICD-10-CM

## 2017-11-03 NOTE — Patient Instructions (Addendum)
bp seems good.  Stay on same medicine.  Glad you are doing better.    Disc with dr  D also about the fracture ? If  Would benefit from bone building therapy.    lab work  LIPID panel   Hg a1c  And tsh    Continue to monitor your   Blood pressure

## 2017-11-03 NOTE — Progress Notes (Signed)
Chief Complaint  Patient presents with  . Welcome to Medicare    Discuss Measles vaccine / Flu vaccine today / BP check, change medications says doesn't need a cpx   . Medication Management  . Hypertension  . Hyperlipidemia    HPI: BERNIECE Cardenas 67 y.o. come in for Chronic disease management  Has had exam in recnet past gyne and post surgery   Had complicated knee replacement  Femoral fracture.  Left but doing better   Rehabing.   OP now and doing much better.    had post op tomorrow   Taking tramadol once a day. And ref tylenol.   Headaches   Come and go  No specific  Usual in am but not on awakening and no assoc sx   Vision hearing ok .   Asks about measles inj had as a child   Flu vaccine   Bp readings  Here  Is on losartan and amlodipine   Readings [phone]  Systolic  diastolic in 70 - 40J  Pulse in 80s   Lipids taking lovastatin   Has rheum appt soon  To get blood labs  ROS: See pertinent positives and negatives per HPI.  Past Medical History:  Diagnosis Date  . Anemia    iron def  . Arthritis    rheumatoid and osteo now flt to be psoriateic   . Arthritis    psoreratic, osteoarthritis  . Broken toe   . Bruises easily   . CHOLECYSTECTOMY, HX OF   . Chronic back pain   . COLONIC POLYPS, HX OF 02/09/2007  . Cough, persistent 01/21/2012   poss from acei   . 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 Fibromyalgia    doesn't require meds  . GASTRIC ULCER, ACUTE, HEMORRHAGE, HX OF 02/09/2007  . GERD (gastroesophageal reflux disease)    takes Nexium daily  . Goiter   . H/O hiatal hernia   . Headache(784.0)    occasionally  . History of bronchitis   . Hx of seasonal allergies    takes Claritin daily  . HYPERLIPIDEMIA 12/29/2006   takes Lovastatin nightly  . HYPERTENSION 12/29/2006   takes Enalapril daily  . IBS 04/28/2007  . Insomnia    related to pain;takes Flexeril and Tylenol PM nightly  . Joint pain   . Joint swelling   . Palpitations  02/09/2007  . PONV (postoperative nausea and vomiting)    NAUSEA  . REDUCTION MAMMOPLASTY, HX OF 02/09/2007  . Rheumatoid arthritis(714.0) 12/29/2006  . Right bundle branch block 02/09/2007  . S/P lumbar fusion 6 13 09/03/2011   L4 L5  posterior   . SYNCOPE 12/07/2007  . UNS ADVRS EFF OTH RX MEDICINAL\T\BIOLOGICAL SBSTNC 02/09/2007    Family History  Problem Relation Age of Onset  . Diabetes Mother   . Colon cancer Mother        x2  . Arthritis Mother   . Hypertension Mother   . Endometrial cancer Mother   . Stroke Father   . Heart disease Father   . Colon cancer Father   . Arthritis Brother   . Hypertension Brother   . Hyperlipidemia Brother   . Infertility Brother   . Other Unknown        DISH brother  . Hypertension Maternal Grandmother   . Stroke Maternal Grandmother   . Anesthesia problems Neg Hx   . Hypotension Neg Hx   . Malignant hyperthermia Neg Hx   .  Pseudochol deficiency Neg Hx     Social History   Socioeconomic History  . Marital status: Widowed    Spouse name: Not on file  . Number of children: Not on file  . Years of education: Not on file  . Highest education level: Not on file  Occupational History  . Not on file  Social Needs  . Financial resource strain: Not on file  . Food insecurity:    Worry: Not on file    Inability: Not on file  . Transportation needs:    Medical: Not on file    Non-medical: Not on file  Tobacco Use  . Smoking status: Never Smoker  . Smokeless tobacco: Never Used  Substance and Sexual Activity  . Alcohol use: No    Alcohol/week: 0.0 standard drinks  . Drug use: No  . Sexual activity: Not Currently    Partners: Male    Birth control/protection: Surgical    Comment: TAH/BSO  Lifestyle  . Physical activity:    Days per week: Not on file    Minutes per session: Not on file  . Stress: Not on file  Relationships  . Social connections:    Talks on phone: Not on file    Gets together: Not on file    Attends  religious service: Not on file    Active member of club or organization: Not on file    Attends meetings of clubs or organizations: Not on file    Relationship status: Not on file  Other Topics Concern  . 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 acetaminophen (TYLENOL) 650 MG CR tablet Take 650 mg by mouth every 8 (eight) hours as needed for pain.     Marland Kitchen amLODipine (NORVASC) 5 MG tablet Take 1 tablet (5 mg total) by mouth daily. 90 tablet 3  . Cholecalciferol (VITAMIN D PO) Vitamin D    . Diphenhydramine-APAP, sleep, (TYLENOL PM EXTRA STRENGTH PO) Take 2 tablets by mouth at bedtime as needed. For sleep    . losartan (COZAAR) 100 MG tablet Take 1 tablet (100 mg total) by mouth daily. 90 tablet 3  . lovastatin (MEVACOR) 20 MG tablet Take 1 tablet (20 mg total) by mouth at bedtime. (Patient taking differently: Take 20 mg by mouth every evening. ) 90 tablet 3  . methotrexate 50 MG/2ML injection Inject 50 mg/m2 into the vein once a week. Inj 0.5 weekly    . Multiple Vitamins-Minerals (MULTIVITAMIN ADULTS PO) multivitamin    . NONFORMULARY OR COMPOUNDED ITEM Apply 1-2 g topically every Monday, 17-Jun-2022, and Friday. Dexamethasone 4mg /ml dispensed 75ml, for topical use with iontophoresis procedure 30 each 0  . omeprazole (PRILOSEC) 40 MG capsule Take 40 mg by mouth daily.    Marland Kitchen SIMPONI 50 MG/0.5ML SOAJ INJECT 50MG  (1 PEN) UNDER THE SKIN ONCE MONTHLY 3 Syringe 0  . traMADol (ULTRAM) 50 MG tablet Take 1-2 tablets (50-100 mg total) by mouth every 6 (six) hours as needed for moderate pain (give if Po Oxycodone is insufficient for pain). 40 tablet 0  . Tuberculin-Allergy Syringes (B-D ALLERGY SYRINGE 1CC/28G) 28G X 1/2" 1 ML MISC USE AS DIRECTED ONCE WEEKLY 12 each 3  . Biotin w/ Vitamins C & E  (HAIR/SKIN/NAILS PO) Hair, Skin,  Nails with Biotin    . clobetasol ointment (TEMOVATE) 0.05 % APPLY TWICE A DAY (Patient taking differently: APPLY TWICE A DAY AS NEEDED FOR PSORISIS) 45 g 1  . cyclobenzaprine (FLEXERIL) 5 MG tablet as needed.    . gabapentin (NEURONTIN) 300 MG capsule Take 1 capsule (300 mg total) by mouth 3 (three) times daily. Gabapentin 300 mg Protocol Take a 300 mg capsule three times a day for two weeks, Then a 300 mg capsule twice a day for two weeks, Then a 300 mg capsule once a day for two weeks, then discontinue the Gabapentin. 40 capsule 0  . HYDROcodone-acetaminophen (NORCO) 5-325 MG tablet as needed.    Marland Kitchen HYDROmorphone (DILAUDID) 2 MG tablet Take 1-2 tablets (2-4 mg total) by mouth every 6 (six) hours as needed for severe pain. 56 tablet 0  . methocarbamol (ROBAXIN) 500 MG tablet Take 1 tablet (500 mg total) by mouth every 6 (six) hours as needed for muscle spasms. 40 tablet 0  . rivaroxaban (XARELTO) 10 MG TABS tablet Take 1 tablet (10 mg total) by mouth daily with breakfast. 19 tablet 0  . esomeprazole (NEXIUM) 40 MG capsule Take 1 capsule (40 mg total) by mouth daily before breakfast. (Patient not taking: Reported on 11/03/2017) 30 capsule 11   No facility-administered medications prior to visit.      EXAM:  BP 128/86 (BP Location: Right Arm, Patient Position: Sitting, Cuff Size: Normal)   Pulse 75   Temp 98 F (36.7 C) (Oral)   Ht 5' 3.5" (1.613 m)   Wt 201 lb 4.8 oz (91.3 kg)   LMP 03/02/1984   SpO2 98%   BMI 35.10 kg/m   Body mass index is 35.1 kg/m.  GENERAL: vitals reviewed and listed above, alert, oriented, appears well hydrated and in no acute distress HEENT: atraumatic, conjunctiva  clear, no obvious abnormalities on inspection of external nose and ears NECK: no obvious masses on inspection palpation  LUNGS: clear to auscultation bilaterally, no wheezes, rales or rhonchi, good air movement CV: HRRR, no clubbing cyanosis or  peripheral edema nl  cap refill  MS: moves all extremities without noticeable focal  Abnormality healed left knee scar   Gait more facile that in the past   PSYCH: pleasant and cooperative, no obvious depression or anxiety Lab Results  Component Value Date   WBC 5.2 07/28/2017   HGB 13.1 07/28/2017   HCT 37.3 07/28/2017   PLT 380 07/28/2017   GLUCOSE 107 (H) 07/28/2017   CHOL 196 11/30/2016   TRIG 166.0 (H) 11/30/2016   HDL 65.80 11/30/2016   LDLDIRECT 114.0 04/02/2010   LDLCALC 97 11/30/2016   ALT 19 07/28/2017   AST 20 07/28/2017   NA 138 07/28/2017   K 4.3 07/28/2017   CL 103 07/28/2017   CREATININE 0.70 07/28/2017   BUN 14 07/28/2017   CO2 24 07/28/2017   TSH 1.18 11/30/2016   INR 0.96 06/16/2017   HGBA1C 5.4 11/30/2016   BP Readings from Last 3 Encounters:  11/03/17 128/86  07/28/17 126/69  06/25/17 (!) 143/72    ASSESSMENT AND PLAN:  Discussed the following assessment and plan:  Essential hypertension  History of total knee replacement, left  Medication management  Hyperlipidemia, unspecified hyperlipidemia type  Psoriatic arthritis ([place])  High risk medication use  Hyperglycemia  Goiter  Need for influenza vaccination - Plan: Flu vaccine HIGH DOSE PF (Fluzone High dose) Due for monitoring labs   rx given to pat to hopefully get at next  venipuncture  Lipid  a1c and tsh  bp seems controlled and on reasonable meds at this time  Will follow   rov in about 6 mos or when appropriate  No need for measles vaccine if had disease and born before 1957 but if compelling reason could check serology decided not  today Total visit 3mins > 50% spent counseling and coordinating care as indicated in above note and in instructions to patient .   -Patient advised to return or notify health care team  if  new concerns arise.  Patient Instructions  bp seems good.  Stay on same medicine.  Glad you are doing better.    Disc with dr  D also about the fracture ? If  Would benefit from bone  building therapy.    lab work  LIPID panel   Hg a1c  And tsh    Continue to monitor your   Blood pressure    Mariann Laster K. Panosh M.D.

## 2017-11-11 ENCOUNTER — Other Ambulatory Visit: Payer: Self-pay

## 2017-11-11 ENCOUNTER — Other Ambulatory Visit: Payer: Self-pay | Admitting: *Deleted

## 2017-11-11 DIAGNOSIS — Z9225 Personal history of immunosupression therapy: Secondary | ICD-10-CM

## 2017-11-11 DIAGNOSIS — Z79899 Other long term (current) drug therapy: Secondary | ICD-10-CM

## 2017-11-15 ENCOUNTER — Telehealth: Payer: Self-pay | Admitting: Rheumatology

## 2017-11-15 NOTE — Telephone Encounter (Signed)
Opened in error

## 2017-11-16 LAB — COMPLETE METABOLIC PANEL WITH GFR
AG Ratio: 1.6 (calc) (ref 1.0–2.5)
ALT: 16 U/L (ref 6–29)
AST: 17 U/L (ref 10–35)
Albumin: 4.3 g/dL (ref 3.6–5.1)
Alkaline phosphatase (APISO): 90 U/L (ref 33–130)
BUN: 19 mg/dL (ref 7–25)
CO2: 27 mmol/L (ref 20–32)
Calcium: 9.6 mg/dL (ref 8.6–10.4)
Chloride: 105 mmol/L (ref 98–110)
Creat: 0.82 mg/dL (ref 0.50–0.99)
GFR, Est African American: 87 mL/min/{1.73_m2} (ref >=60)
GFR, Est Non African American: 75 mL/min/{1.73_m2} (ref >=60)
Globulin: 2.7 g/dL (calc) (ref 1.9–3.7)
Glucose, Bld: 81 mg/dL (ref 65–99)
Potassium: 4.5 mmol/L (ref 3.5–5.3)
Sodium: 142 mmol/L (ref 135–146)
Total Bilirubin: 0.7 mg/dL (ref 0.2–1.2)
Total Protein: 7 g/dL (ref 6.1–8.1)

## 2017-11-16 LAB — CBC WITH DIFFERENTIAL/PLATELET
Basophils Absolute: 41 cells/uL (ref 0–200)
Basophils Relative: 0.7 %
Eosinophils Absolute: 52 cells/uL (ref 15–500)
Eosinophils Relative: 0.9 %
HCT: 38.3 % (ref 35.0–45.0)
Hemoglobin: 13.3 g/dL (ref 11.7–15.5)
Lymphs Abs: 1212 cells/uL (ref 850–3900)
MCH: 32.6 pg (ref 27.0–33.0)
MCHC: 34.7 g/dL (ref 32.0–36.0)
MCV: 93.9 fL (ref 80.0–100.0)
MPV: 9.9 fL (ref 7.5–12.5)
Monocytes Relative: 7.2 %
Neutro Abs: 4077 cells/uL (ref 1500–7800)
Neutrophils Relative %: 70.3 %
Platelets: 328 10*3/uL (ref 140–400)
RBC: 4.08 10*6/uL (ref 3.80–5.10)
RDW: 13.6 % (ref 11.0–15.0)
Total Lymphocyte: 20.9 %
WBC mixed population: 418 cells/uL (ref 200–950)
WBC: 5.8 10*3/uL (ref 3.8–10.8)

## 2017-11-16 LAB — QUANTIFERON-TB GOLD PLUS
Mitogen-NIL: 3.88 IU/mL
NIL: 0.04 IU/mL
QuantiFERON-TB Gold Plus: NEGATIVE
TB1-NIL: <0 IU/mL
TB2-NIL: 0.01 IU/mL

## 2017-11-26 ENCOUNTER — Other Ambulatory Visit (INDEPENDENT_AMBULATORY_CARE_PROVIDER_SITE_OTHER): Payer: Medicare Other

## 2017-11-26 DIAGNOSIS — R739 Hyperglycemia, unspecified: Secondary | ICD-10-CM | POA: Diagnosis not present

## 2017-11-26 DIAGNOSIS — E049 Nontoxic goiter, unspecified: Secondary | ICD-10-CM | POA: Diagnosis not present

## 2017-11-26 DIAGNOSIS — E785 Hyperlipidemia, unspecified: Secondary | ICD-10-CM

## 2017-11-26 LAB — LIPID PANEL
Cholesterol: 200 mg/dL (ref 0–200)
HDL: 58.2 mg/dL (ref >39.00)
LDL Cholesterol: 109 mg/dL — ABNORMAL HIGH (ref 0–99)
NonHDL: 141.95
Total CHOL/HDL Ratio: 3
Triglycerides: 163 mg/dL — ABNORMAL HIGH (ref 0.0–149.0)
VLDL: 32.6 mg/dL (ref 0.0–40.0)

## 2017-11-26 LAB — HEMOGLOBIN A1C: Hgb A1c MFr Bld: 5.4 % (ref 4.6–6.5)

## 2017-11-26 LAB — TSH: TSH: 1.27 u[IU]/mL (ref 0.35–4.50)

## 2017-11-29 ENCOUNTER — Other Ambulatory Visit: Payer: Self-pay | Admitting: Rheumatology

## 2017-11-29 NOTE — Telephone Encounter (Signed)
Last Visit: 07/28/17 Next Visit: 12/29/17  Okay to refill per Dr. Estanislado Pandy

## 2017-12-06 ENCOUNTER — Encounter: Payer: Medicare Other | Admitting: Internal Medicine

## 2017-12-10 ENCOUNTER — Other Ambulatory Visit: Payer: Self-pay | Admitting: Rheumatology

## 2017-12-10 ENCOUNTER — Telehealth: Payer: Self-pay | Admitting: Rheumatology

## 2017-12-10 MED ORDER — METHOTREXATE SODIUM CHEMO INJECTION 50 MG/2ML: 7.5000 mg | INTRAMUSCULAR | 0 refills | Status: DC

## 2017-12-10 NOTE — Telephone Encounter (Signed)
Last Visit: 07/28/17 Next Visit: 12/29/17 Labs: 11/11/17 WNL  Okay to refill per Dr. Estanislado Pandy

## 2017-12-10 NOTE — Telephone Encounter (Signed)
Patient request refill on MTX sent to [facility] on [address].

## 2017-12-10 NOTE — Telephone Encounter (Signed)
Last Visit: 07/28/17 Next Visit: 12/29/17 Labs: 11/11/17 WNL TB Gold: 11/11/17 Neg   Okay to refill per Dr. Estanislado Pandy

## 2017-12-15 NOTE — Progress Notes (Signed)
Office Visit Note  Patient: Jade Cardenas             Date of Birth: 11/15/1950           MRN: [id number]             PCP: [name], MD Referring: [name], MD Visit Date: 12/29/2017 Occupation: @GUAROCC @  Subjective:  Muscle aches and muscle tenderness   History of Present Illness: Jade Cardenas is a 67 y.o. female with history of psoriatic arthritis, osteoarthritis, fibromyalgia, and DDD.  Patient is on Simponi subcutaneous injections once monthly, methotrexate 0.3 mL subcutaneously once weekly and folic acid 1 mg by mouth daily.  She denies any psoriatic arthritis flares.  She has a little bit of psoriasis on both elbows.  She continues to have intermittent right plantar fasciitis.  She denies any achilles tendonitis.  She reports she continues to have chronic left knee pain following the knee replacement in April 2019. She will be following up with Dr. Maureen Ralphs in December 2019. She uses a cane.  She continues to go to PT and reports she recently was doing weight lifting exercises and heard a pop in the left knee. She reports she is no longer taking tramadol for pain relief.  She has transitioned from taking tylenol to Aleve.  She continues to have generalized muscle aches and muscle tenderness.  She has been having increased right shoulder pain and limited ROM since September.   She reports she is up to date on the yearly influenza vaccine, shingrix, and pneumonia vaccine.  She reports she will be going to an info session on stem cell therapy soon. She reports that she is concerned about her bone density since she has been on PPIs for many years due to history of gastric ulcers.  She had a bone density in July 2019 that revealed osteopenia.  She has been taking calcium and vitamin D daily.     Activities of Daily Living:  Patient reports morning stiffness for 1-2  hours.   Patient Reports nocturnal pain.  Difficulty dressing/grooming: Denies Difficulty climbing  stairs: Denies Difficulty getting out of chair: Denies Difficulty using hands for taps, buttons, cutlery, and/or writing: Denies  Review of Systems  Constitutional: Positive for fatigue.  HENT: Positive for mouth dryness. Negative for mouth sores and nose dryness.   Eyes: Positive for dryness. Negative for pain and visual disturbance.  Respiratory: Negative for cough, hemoptysis, shortness of breath and difficulty breathing.   Cardiovascular: Negative for chest pain, palpitations, hypertension and swelling in legs/feet.  Gastrointestinal: Negative for blood in stool, constipation and diarrhea.  Endocrine: Negative for increased urination.  Genitourinary: Negative for painful urination.  Musculoskeletal: Positive for arthralgias, joint pain, myalgias, morning stiffness, muscle tenderness and myalgias. Negative for joint swelling and muscle weakness.  Skin: Negative for color change, pallor, rash, hair loss, nodules/bumps, skin tightness, ulcers and sensitivity to sunlight.  Allergic/Immunologic: Negative for susceptible to infections.  Neurological: Negative for dizziness, numbness, headaches and weakness.  Hematological: Negative for swollen glands.  Psychiatric/Behavioral: Positive for sleep disturbance. Negative for depressed mood. The patient is not nervous/anxious.     PMFS History:  Patient Active Problem List   Diagnosis Date Noted  . History of total knee replacement, left 06/28/2017  . OA (osteoarthritis) of knee 06/21/2017  . Varicose veins of bilateral lower extremities with other complications 37/85/8850  . DDD (degenerative disc disease), lumbar 10/19/2016  . Goiter 08/28/2016  . Family history of  malignant neoplasm of digestive organ 08/27/2016  . 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 Fibromyalgia 05/17/2016  . High risk medication use 02/18/2016  . DJD (degenerative joint disease), cervical 02/18/2016  . Heel spur 09/29/2015  . Ingrown toenail 09/29/2015  . Foraminal  stenosis of cervical region 07/30/2015  . Microcytic hypochromic anemia 05/22/2015  . Tailor's bunion, left 01/18/2015  . H/O hiatal hernia   . Bilateral primary osteoarthritis of knee 12/15/2013  . Iron deficiency 12/15/2013  . Psoriatic arthritis ([place]) 12/15/2013  . History of foot fracture 12/15/2013  . Dermatitis 10/17/2013  . Arthritis 05/31/2013  . 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 Skin lesion 12/09/2012  . Tinnitus 06/23/2012  . Scalp lesion 01/21/2012  . Headache(784.0) 01/21/2012  . Hx of psoriasis 01/21/2012  . Weight gain 01/21/2012  . Ankylosing spondylitis  possible 09/03/2011  . Hx of transfusion 09/03/2011  . S/P lumbar fusion 6 13 09/03/2011  . Lumbar stenosis with neurogenic claudication 08/05/2011  . LBP (low back pain) 01/17/2011  . ALLERGIC RHINITIS 02/19/2010  . GERD 02/19/2010  . ARTHRITIS 02/19/2010  . SPINAL STENOSIS, CERVICAL 02/19/2010  . OSTEOPENIA 02/19/2010  . RASH AND OTHER NONSPECIFIC SKIN ERUPTION 12/07/2007  . IBS 04/28/2007  . History of gastric ulcer 02/09/2007  . COLONIC POLYPS, HX OF 02/09/2007  . CHOLECYSTECTOMY, HX OF 02/09/2007  . Hyperlipidemia 12/29/2006  . Essential hypertension 12/29/2006    Past Medical History:  Diagnosis Date  . Anemia    iron def  . Arthritis    rheumatoid and osteo now flt to be psoriateic   . Arthritis    psoreratic, osteoarthritis  . Broken toe   . Bruises easily   . CHOLECYSTECTOMY, HX OF   . Chronic back pain   . COLONIC POLYPS, HX OF 02/09/2007  . Cough, persistent 01/21/2012   poss from acei   . 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 Fibromyalgia    doesn't require meds  . GASTRIC ULCER, ACUTE, HEMORRHAGE, HX OF 02/09/2007  . GERD (gastroesophageal reflux disease)    takes Nexium daily  . Goiter   . H/O hiatal hernia   . Headache(784.0)    occasionally  . History of bronchitis   . Hx of seasonal allergies    takes Claritin daily  . HYPERLIPIDEMIA  12/29/2006   takes Lovastatin nightly  . HYPERTENSION 12/29/2006   takes Enalapril daily  . IBS 04/28/2007  . Insomnia    related to pain;takes Flexeril and Tylenol PM nightly  . Joint pain   . Joint swelling   . Palpitations 02/09/2007  . PONV (postoperative nausea and vomiting)    NAUSEA  . REDUCTION MAMMOPLASTY, HX OF 02/09/2007  . Rheumatoid arthritis(714.0) 12/29/2006  . Right bundle branch block 02/09/2007  . S/P lumbar fusion 6 13 09/03/2011   L4 L5  posterior   . SYNCOPE 12/07/2007  . UNS ADVRS EFF OTH RX MEDICINAL\T\BIOLOGICAL SBSTNC 02/09/2007    Family History  Problem Relation Age of Onset  . Diabetes Mother   . Colon cancer Mother        x2  . Arthritis Mother   . Hypertension Mother   . Endometrial cancer Mother   . Stroke Father   . Heart disease Father   . Colon cancer Father   . Arthritis Brother   . Hypertension Brother   . Hyperlipidemia Brother   . Infertility Brother   . Other Unknown        DISH brother  .  Hypertension Maternal Grandmother   . Stroke Maternal Grandmother   . Anesthesia problems Neg Hx   . Hypotension Neg Hx   . Malignant hyperthermia Neg Hx   . Pseudochol deficiency Neg Hx    Past Surgical History:  Procedure Laterality Date  . BACK SURGERY  12+yrs ago   Synovial Cyst removal  . BACK SURGERY  6/13   lumbar L4-L5 fusion, lamenectomy  . bone spur  6-4yrs ago   right ankle  . BUNIONECTOMY  10/03/14   right foot  . CERVICAL FUSION     2 12   . CHOLECYSTECTOMY  1996  . JOINT REPLACEMENT    . KNEE ARTHROPLASTY    . POSTERIOR CERVICAL LAMINECTOMY Left 07/30/2015   Procedure: Laminectomy and Foraminotomy - left - Cervical two -Cervical three;  Surgeon: Earnie Larsson, MD;  Location: [facility];  Service: Neurosurgery;  Laterality: Left;  . REDUCTION MAMMAPLASTY Bilateral 1989  . TONSILLECTOMY     as a child  . TOTAL ABDOMINAL HYSTERECTOMY W/ BILATERAL SALPINGOOPHORECTOMY  1986  . 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 She appears well-developed and well-nourished.  HENT:  Head: Normocephalic and atraumatic.  Eyes: Conjunctivae and EOM are normal.  Neck: Normal range of motion.  Cardiovascular: Normal rate, regular rhythm, normal heart sounds and intact distal pulses.  Pulmonary/Chest: Effort normal and breath sounds normal.  Abdominal: Soft. Bowel sounds are normal.  Lymphadenopathy:    She has no cervical adenopathy.  Neurological: She is alert and oriented to person, place, and time.  Skin: Skin is warm and dry. Capillary refill takes less than 2 seconds.  Psychiatric: She has a normal mood and affect. Her behavior is normal.  Nursing note and vitals reviewed.    Musculoskeletal Exam: C-spine limited ROM. Postural thoracic kyphosis. Limited ROM of lumbar spine.  140 degrees abduction of bilateral shoulder joints. Elbow joints, wrist joints, MCPs, PIPs, and DIPs good ROM with no synovitis. PIP and DIP synovial thickening consistent with osteoarthritis.  Bilateral CMC joint synovial thickening.  Hip joints good ROM. Right knee full ROM with no discomfort.  Left knee replacement warm and good ROM.  No tenderness or swelling of ankle joints.  CDAI Exam: CDAI Score: Not documented Patient Global Assessment: Not documented; Provider Global Assessment: Not documented Swollen: Not  documented; Tender: Not documented Joint Exam   Not documented   There is currently no information documented on the homunculus. Go to the Rheumatology activity and complete the homunculus joint exam.  Investigation: No additional findings.  Imaging: No results found.  Recent Labs: Lab Results  Component Value Date   WBC 5.8 11/11/2017   HGB 13.3 11/11/2017   PLT 328 11/11/2017   NA 142 11/11/2017   K 4.5 11/11/2017  CL 105 11/11/2017   CO2 27 11/11/2017   GLUCOSE 81 11/11/2017   BUN 19 11/11/2017   CREATININE 0.82 11/11/2017   BILITOT 0.7 11/11/2017   ALKPHOS 88 06/16/2017   AST 17 11/11/2017   ALT 16 11/11/2017   PROT 7.0 11/11/2017   ALBUMIN 4.3 06/16/2017   CALCIUM 9.6 11/11/2017   GFRAA 87 11/11/2017   QFTBGOLDPLUS NEGATIVE 11/11/2017   Speciality Comments: Failed therapy: Arava and Otezla due to diarrhea  Procedures:  Large Joint Inj: R glenohumeral on 12/29/2017 10:04 AM Indications: pain Details: 27 G 1.5 in needle, posterior approach  Arthrogram: No  Medications: 1.5 mL lidocaine 1 %; 40 mg triamcinolone acetonide 40 MG/ML Aspirate: 0 mL Outcome: tolerated well, no immediate complications Procedure, treatment alternatives, risks and benefits explained, specific risks discussed. Consent was given by the patient. Immediately prior to procedure a time out was called to verify the correct patient, procedure, equipment, support staff and site/side marked as required. Patient was prepped and draped in the usual sterile fashion.     Allergies: Erythromycin; Iodinated diagnostic agents; Ace inhibitors; Morphine and related; Keflex [cephalexin]; and Macrobid [nitrofurantoin]   Assessment / Plan:     Visit Diagnoses: Psoriatic arthritis ([place]): She has no synovitis or dactylitis on exam. She has not had any recent flares. She has small patches of psoriasis on the extensor surfaces of both elbow joints.  No achilles tendonitis or plantar fasciitis at this time.   She has no SI joint tenderness. She is clinically doing well on Simponi sq injections once monthly, MTX 0.3 ml once weekly, and folic acid 1 mg by mouth daily.  She will continue on this current treatment regimen.  She was advised to notify us if she develops increased joint pain or joint swelling. She will follow up in the office in 5 months. She is up to date on the yearly influenza vaccine, pneumonia vaccine, and shingles vaccination.   High risk medication use - Simponi subcutaneous monthly injections, methotrexate 0.3 mL subcu weekly, folic acid 1 mg p.o. daily ( Arava and Otezla caused diarrhea). CBC and CMP were WNL on 11/11/17.  TB gold negative on 11/11/17. She will return in December and every 3 months for lab work to monitor for drug toxicity.   Hx of psoriasis: She has a few small patches of psoriasis on the extensor surfaces of both elbow joints.   Primary osteoarthritis of right knee: Full ROM with no discomfort.  No warmth or effusion noted.  Total knee replacement status, left - Dr. Maureen Ralphs: Chronic pain. She has good ROM with warmth on exam. She continues to walk with a cane. She continues to go to physical therapy.  She will be following up with Dr. Maureen Ralphs in December 2019.   Fibromyalgia: She is currently having a fibromyalgia flare. She has generalized hyperalgesia and positive tender points on exam.  She has generalized muscle aches and muscle tenderness.  She continues to have chronic fatigue and insomnia.  She was encouraged to stay active and exercise on a regular basis.   Chronic fatigue: She has chronic fatigue related to insomnia.  She was encouraged to continue to stay active and exercise on a regular basis.   Chronic right shoulder pain -She has been having increased right shoulder pain and limited ROM since September.  She states the pain started after she had a yearly influenza vaccine.  She has right shoulder abduction to 140 degrees with discomfort.  She requested a right  shoulder cortisone  injection today.  She tolerated the procedure well.  She was advised to monitor her blood pressure very closely following the injection.  Plan: Large Joint Inj: R glenohumeral  DDD (degenerative disc disease), cervical - S/P fusion: She has limited lateral rotation of the C-spine. No symptoms of radiculopathy at this time.   DDD (degenerative disc disease), lumbar - S/P fusion: She has limited ROM with discomfort.   Osteopenia of multiple sites - DEXA 09/15/17 osteopenia T -1.8   Iron deficiency -  Dr. Irene Limbo, hematologist   History of gastric ulcer - f/u by Dr. [name]   History of IBS  H/O hiatal hernia  History of hyperlipidemia    Orders: Orders Placed This Encounter  Procedures  . Large Joint Inj: R glenohumeral   No orders of the defined types were placed in this encounter.   Face-to-face time spent with patient was 30 minutes. Greater than 50% of time was spent in counseling and coordination of care.  Follow-Up Instructions: Return in about 5 months (around 05/30/2018) for Psoriatic arthritis, Osteoarthritis, DDD, Fibromyalgia.   Ofilia Neas, PA-C   I examined and evaluated the patient with Hazel Sams PA.  Patient had no active synovitis on my examination today.  She had no psoriasis lesions.  She was having discomfort with range of motion of her right shoulder joint.  She does have some osteoarthritis which causes chronic pain.  Per her request after informed consent was obtained right shoulder joint was injected with cortisone.  She has been advised to monitor blood pressure closely.  The plan of care was discussed as noted above.  Bo Merino, MD  Note - This record has been created using Editor, commissioning.  Chart creation errors have been sought, but may not always  have been located. Such creation errors do not reflect on  the standard of medical care.

## 2017-12-29 ENCOUNTER — Ambulatory Visit: Payer: Medicare Other | Admitting: Rheumatology

## 2017-12-29 ENCOUNTER — Encounter: Payer: Self-pay | Admitting: Rheumatology

## 2017-12-29 VITALS — BP 154/81 | HR 80 | Resp 15 | Ht 64.0 in | Wt 207.0 lb

## 2017-12-29 DIAGNOSIS — M1711 Unilateral primary osteoarthritis, right knee: Secondary | ICD-10-CM

## 2017-12-29 DIAGNOSIS — Z8719 Personal history of other diseases of the digestive system: Secondary | ICD-10-CM

## 2017-12-29 DIAGNOSIS — E611 Iron deficiency: Secondary | ICD-10-CM

## 2017-12-29 DIAGNOSIS — Z8639 Personal history of other endocrine, nutritional and metabolic disease: Secondary | ICD-10-CM

## 2017-12-29 DIAGNOSIS — M5030 Other cervical disc degeneration, unspecified cervical region: Secondary | ICD-10-CM

## 2017-12-29 DIAGNOSIS — M8589 Other specified disorders of bone density and structure, multiple sites: Secondary | ICD-10-CM

## 2017-12-29 DIAGNOSIS — Z872 Personal history of diseases of the skin and subcutaneous tissue: Secondary | ICD-10-CM | POA: Diagnosis not present

## 2017-12-29 DIAGNOSIS — Z8711 Personal history of peptic ulcer disease: Secondary | ICD-10-CM

## 2017-12-29 DIAGNOSIS — Z79899 Other long term (current) drug therapy: Secondary | ICD-10-CM | POA: Diagnosis not present

## 2017-12-29 DIAGNOSIS — M25511 Pain in right shoulder: Secondary | ICD-10-CM | POA: Diagnosis not present

## 2017-12-29 DIAGNOSIS — M5136 Other intervertebral disc degeneration, lumbar region: Secondary | ICD-10-CM

## 2017-12-29 DIAGNOSIS — G8929 Other chronic pain: Secondary | ICD-10-CM

## 2017-12-29 DIAGNOSIS — L4050 Arthropathic psoriasis, unspecified: Secondary | ICD-10-CM

## 2017-12-29 DIAGNOSIS — Z96652 Presence of left artificial knee joint: Secondary | ICD-10-CM

## 2017-12-29 DIAGNOSIS — R5382 Chronic fatigue, unspecified: Secondary | ICD-10-CM

## 2017-12-29 DIAGNOSIS — M797 Fibromyalgia: Secondary | ICD-10-CM

## 2017-12-29 MED ORDER — TRIAMCINOLONE ACETONIDE 40 MG/ML IJ SUSP
40.0000 mg | INTRAMUSCULAR | Status: AC | PRN
  Administered 2017-12-29: 40 mg via INTRA_ARTICULAR

## 2017-12-29 MED ORDER — LIDOCAINE HCL 1 % IJ SOLN
1.5000 mL | INTRAMUSCULAR | Status: AC | PRN
  Administered 2017-12-29: 1.5 mL

## 2017-12-29 NOTE — Patient Instructions (Signed)
Standing Labs We placed an order today for your standing lab work.    Please come back and get your standing labs in December and every 3 months   We have open lab Monday through Friday from 8:30-11:30 AM and 1:30-4:00 PM  at the office of Dr. Shaili Deveshwar.   You may experience shorter wait times on Monday and Friday afternoons. The office is located at [address], [address], [place], [place] [address] No appointment is necessary.   Labs are drawn by [facility].  You may receive a bill from [facility] for your lab work. If you have any questions regarding directions or hours of operation,  please call [phone].   Just as a reminder please drink plenty of water prior to coming for your lab work. Thanks!   

## 2018-01-05 ENCOUNTER — Other Ambulatory Visit: Payer: Self-pay | Admitting: Rheumatology

## 2018-01-05 NOTE — Telephone Encounter (Signed)
Last Visit: 12/29/17 Next Visit: 06/01/18 Labs: 11/11/17 WNL TB Gold: 11/11/17 Neg  Okay to refill per Dr. Estanislado Pandy

## 2018-01-30 ENCOUNTER — Other Ambulatory Visit: Payer: Self-pay | Admitting: Rheumatology

## 2018-01-31 NOTE — Telephone Encounter (Signed)
Last Visit: 12/29/17 Next Visit: 06/01/18 Labs: 11/11/17 WNL  Okay to refill per Dr. Estanislado Pandy

## 2018-03-07 ENCOUNTER — Telehealth: Payer: Self-pay | Admitting: Pharmacy Technician

## 2018-03-07 NOTE — Telephone Encounter (Signed)
Received renewal application from [employer]  for [facility] . Completed office portion and spoke with patient about required information. She will bring income documents to office sometime this week.  10:06 AM Beatriz Chancellor, CPhT

## 2018-03-08 ENCOUNTER — Other Ambulatory Visit: Payer: Self-pay

## 2018-03-08 DIAGNOSIS — Z79899 Other long term (current) drug therapy: Secondary | ICD-10-CM | POA: Diagnosis not present

## 2018-03-08 NOTE — Telephone Encounter (Signed)
Patient came in to office for labs and brought income documents for her renewal application.  Submitted Patient Assistance Application to [employer]  for [facility]. Will follow up to confirm receipt.   Will send documents to scan center.  Fax# [phone] Phone# [phone]

## 2018-03-09 LAB — COMPLETE METABOLIC PANEL WITH GFR
AG Ratio: 1.7 (calc) (ref 1.0–2.5)
ALT: 18 U/L (ref 6–29)
AST: 18 U/L (ref 10–35)
Albumin: 4.4 g/dL (ref 3.6–5.1)
Alkaline phosphatase (APISO): 90 U/L (ref 33–130)
BUN: 16 mg/dL (ref 7–25)
CO2: 28 mmol/L (ref 20–32)
Calcium: 9.5 mg/dL (ref 8.6–10.4)
Chloride: 103 mmol/L (ref 98–110)
Creat: 0.67 mg/dL (ref 0.50–0.99)
GFR, Est African American: 107 mL/min/{1.73_m2} (ref >=60)
GFR, Est Non African American: 92 mL/min/{1.73_m2} (ref >=60)
Globulin: 2.6 g/dL (calc) (ref 1.9–3.7)
Glucose, Bld: 95 mg/dL (ref 65–99)
Potassium: 4.8 mmol/L (ref 3.5–5.3)
Sodium: 141 mmol/L (ref 135–146)
Total Bilirubin: 0.7 mg/dL (ref 0.2–1.2)
Total Protein: 7 g/dL (ref 6.1–8.1)

## 2018-03-09 LAB — CBC WITH DIFFERENTIAL/PLATELET
Absolute Monocytes: 410 cells/uL (ref 200–950)
Basophils Absolute: 57 cells/uL (ref 0–200)
Basophils Relative: 0.9 %
Eosinophils Absolute: 57 cells/uL (ref 15–500)
Eosinophils Relative: 0.9 %
HCT: 38.7 % (ref 35.0–45.0)
Hemoglobin: 13.6 g/dL (ref 11.7–15.5)
Lymphs Abs: 1600 cells/uL (ref 850–3900)
MCH: 32.9 pg (ref 27.0–33.0)
MCHC: 35.1 g/dL (ref 32.0–36.0)
MCV: 93.7 fL (ref 80.0–100.0)
MPV: 9.7 fL (ref 7.5–12.5)
Monocytes Relative: 6.5 %
Neutro Abs: 4177 cells/uL (ref 1500–7800)
Neutrophils Relative %: 66.3 %
Platelets: 339 10*3/uL (ref 140–400)
RBC: 4.13 10*6/uL (ref 3.80–5.10)
RDW: 13.6 % (ref 11.0–15.0)
Total Lymphocyte: 25.4 %
WBC: 6.3 10*3/uL (ref 3.8–10.8)

## 2018-03-15 NOTE — Telephone Encounter (Signed)
Received voicemail that [employer] could not clearly see the patient's tax return. Requested a refax. Faxed forms over again. Called patient back to inform.

## 2018-03-21 NOTE — Telephone Encounter (Signed)
Received fax form Jade Cardenas Jade Cardenas that patient currently does not meet program eligibility at this time. Called to get more info, the rep stated that since patient has Medicare part D, she needs to supply her prescription out of pocket expenses for redetermination.  Called patient, left message to advise.

## 2018-03-21 NOTE — Telephone Encounter (Signed)
.  Received fax from [facility], application has been temporarily APPROVED. Coverage is from 03/21/2018 to 05/20/2018. Patient is required to submit a Medicare Part D attestation form to get fully approved. Called patient to advise, she will come by office to sign.  Will send document to [place].  Phone# [phone] Fax# [phone]

## 2018-03-22 ENCOUNTER — Other Ambulatory Visit: Payer: Self-pay | Admitting: Rheumatology

## 2018-03-22 NOTE — Telephone Encounter (Signed)
Last Visit: 12/29/17 Next Visit: 06/01/18 Labs: 03/08/18 WNL  Okay to refill per Dr. Sallyanne Havers

## 2018-04-15 DIAGNOSIS — M1711 Unilateral primary osteoarthritis, right knee: Secondary | ICD-10-CM | POA: Diagnosis not present

## 2018-04-15 DIAGNOSIS — Z96652 Presence of left artificial knee joint: Secondary | ICD-10-CM | POA: Diagnosis not present

## 2018-04-25 NOTE — Telephone Encounter (Signed)
Received fax from [facility], application has been APPROVED. Coverage is from 03/22/2018 to 03/02/2019.  Will send document to scan Center.  Phone# [phone] Fax# [phone]

## 2018-05-03 ENCOUNTER — Other Ambulatory Visit: Payer: Self-pay | Admitting: Rheumatology

## 2018-05-03 NOTE — Progress Notes (Signed)
Chief Complaint  Patient presents with  . Hypertension    Pt is here to follow up on blood pressure and states she needs surgical clearance for her knee    HPI: Jade Cardenas 68 y.o. come in for Chronic disease management   But needs surgical clearacne for removal of hardware from her left knee.  Had a history of complication of left knee replacement with residual hardware that is causing her pain and difficulties.  To have knee hardware removal in April by Dr.Alusio.    Otherwise she is doing pretty well on treatment for psoriatic arthritis per Dr. Neoma Laming sure Simponi seems to be doing well was able to get [place] to be covered.  She is due for having an iron level ferritin done with her history of iron deficiency and gastric ulcer.  Asks if we can refill her omeprazole 40 mg written as twice daily per Dr. Murvin Natal off when she originally had her ulcer.  However she is only taking it once a day.  Feels doing fine in this area.  Blood pressure readings have been controlled she is continuing on medicine and although discussed deprescribing agree that now is not the correct time to do this.  She presents some old records in regard to her multi-nodular goiter that was evaluated in 1994.  No thyromegaly but some lesions had a 24-hour RAI scan felt to be increased uptake consideration of underlying hyperthyroidism.   ROS: See pertinent positives and negatives per HPI.  No current chest pain shortness of breath new neurovascular symptoms.  She has had to adapt her mobility because of her arthritic knees.   Past Medical History:  Diagnosis Date  . Anemia    iron def  . Arthritis    rheumatoid and osteo now flt to be psoriateic   . Arthritis    psoreratic, osteoarthritis  . Broken toe   . Bruises easily   . CHOLECYSTECTOMY, HX OF   . Chronic back pain   . COLONIC POLYPS, HX OF 02/09/2007  . Cough, persistent 01/21/2012   poss from acei   . DES exposure in  utero, unknown   . Fibromyalgia    doesn't require meds  . GASTRIC ULCER, ACUTE, HEMORRHAGE, HX OF 02/09/2007  . GERD (gastroesophageal reflux disease)    takes Nexium daily  . Goiter   . H/O hiatal hernia   . Headache(784.0)    occasionally  . History of bronchitis   . Hx of seasonal allergies    takes Claritin daily  . HYPERLIPIDEMIA 12/29/2006   takes Lovastatin nightly  . HYPERTENSION 12/29/2006   takes Enalapril daily  . IBS 04/28/2007  . Insomnia    related to pain;takes Flexeril and Tylenol PM nightly  . Joint pain   . Joint swelling   . Palpitations 02/09/2007  . PONV (postoperative nausea and vomiting)    NAUSEA  . REDUCTION MAMMOPLASTY, HX OF 02/09/2007  . Rheumatoid arthritis(714.0) 12/29/2006  . Right bundle branch block 02/09/2007  . S/P lumbar fusion 6 13 09/03/2011   L4 L5  posterior   . SYNCOPE 12/07/2007  . UNS ADVRS EFF OTH RX MEDICINAL\T\BIOLOGICAL SBSTNC 02/09/2007    Family History  Problem Relation Age of Onset  . Diabetes Mother   . Colon cancer Mother        x2  . Arthritis Mother   . Hypertension Mother   . Endometrial cancer Mother   . Stroke Father   . Heart disease  Father   . Colon cancer Father   . Arthritis Brother   . Hypertension Brother   . Hyperlipidemia Brother   . Infertility Brother   . Other Unknown        DISH brother  . Hypertension Maternal Grandmother   . Stroke Maternal Grandmother   . Anesthesia problems Neg Hx   . Hypotension Neg Hx   . Malignant hyperthermia Neg Hx   . Pseudochol deficiency Neg Hx     Social History   Socioeconomic History  . Marital status: Widowed    Spouse name: Not on file  . Number of children: Not on file  . Years of education: Not on file  . Highest education level: Not on file  Occupational History  . Not on file  Social Needs  . Financial resource strain: Not on file  . Food insecurity:    Worry: Not on file    Inability: Not on file  . Transportation needs:    Medical: Not  on file    Non-medical: Not on file  Tobacco Use  . Smoking status: Never Smoker  . Smokeless tobacco: Never Used  Substance and Sexual Activity  . Alcohol use: No    Alcohol/week: 0.0 standard drinks  . Drug use: No  . Sexual activity: Not Currently    Partners: Male    Birth control/protection: Surgical    Comment: TAH/BSO  Lifestyle  . Physical activity:    Days per week: Not on file    Minutes per session: Not on file  . Stress: Not on file  Relationships  . Social connections:    Talks on phone: Not on file    Gets together: Not on file    Attends religious service: Not on file    Active member of club or organization: Not on file    Attends meetings of clubs or organizations: Not on file    Relationship status: Not on file  Other Topics Concern  . 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 B-D ALLERGY SYRINGE 1CC/28G 28G X 1/2" 1 ML MISC USE AS DIRECTED ONCE WEEKLY 12 each 4  . Cholecalciferol (VITAMIN D PO) Vitamin D    . Diphenhydramine-APAP, sleep, (TYLENOL PM EXTRA STRENGTH PO) Take 2 tablets by mouth at bedtime as needed. For sleep    . methotrexate 50 MG/2ML injection INJECT 0.3 MLS (7.5 MG TOTAL) INTO THE SKIN ONCE A WEEK. 4 mL 0  . Multiple Vitamins-Minerals (MULTIVITAMIN ADULTS PO) multivitamin    . SIMPONI 50 MG/0.5ML SOAJ INJECT 50MG  (1 PEN) UNDER THE SKIN ONCE MONTHLY 3 Syringe 0  . amLODipine (NORVASC) 5 MG tablet Take 1 tablet (5 mg total) by mouth daily. 90 tablet 3  . losartan (COZAAR) 100 MG tablet Take 1 tablet (100 mg total) by mouth daily. 90 tablet 3  . lovastatin (MEVACOR) 20 MG tablet Take 1 tablet (20 mg total) by mouth at bedtime. (Patient taking differently: Take 20 mg by mouth every evening. ) 90 tablet 3  . omeprazole (PRILOSEC) 40  MG capsule Take 40 mg by mouth daily.    . naproxen sodium (ALEVE) 220 MG tablet Take 220 mg by mouth as needed.    . NONFORMULARY OR COMPOUNDED ITEM Apply 1-2  g topically every Monday, Wednesday, and Friday. Dexamethasone 4mg /ml dispensed 71ml, for topical use with iontophoresis procedure 30 each 0  . traMADol (ULTRAM) 50 MG tablet Take 1-2 tablets (50-100 mg total) by mouth every 6 (six) hours as needed for moderate pain (give if Po Oxycodone is insufficient for pain). 40 tablet 0  . Tuberculin-Allergy Syringes (B-D ALLERGY SYRINGE 1CC/28G) 28G X 1/2" 1 ML MISC USE AS DIRECTED ONCE WEEKLY 12 each 3   No facility-administered medications prior to visit.      EXAM:  BP 138/84 (BP Location: Right Arm, Patient Position: Sitting, Cuff Size: Large)   Pulse 66   Temp 97.9 F (36.6 C) (Oral)   Ht 5\' 4"  (1.626 m)   Wt 212 lb (96.2 kg)   LMP 03/02/1984   BMI 36.39 kg/m   Body mass index is 36.39 kg/m.  GENERAL: vitals reviewed and listed above, alert, oriented, appears well hydrated and in no acute distress HEENT: atraumatic, conjunctiva  clear, no obvious abnormalities on inspection of external nose and ears   NECK: no obvious masses on inspection palpation  LUNGS: clear to auscultation bilaterally, no wheezes, rales or rhonchi, good air movement mild kyphosis  CV: HRRR, no clubbing cyanosis or  peripheral edema nl cap refill  MS: moves all extremities   Well healed scar   Walking mildly antalgic but  independent gait  PSYCH: pleasant and cooperative, no obvious depression or anxiety Lab Results  Component Value Date   WBC 6.3 05/04/2018   HGB 14.6 05/04/2018   HCT 41.9 05/04/2018   PLT 340.0 05/04/2018   GLUCOSE 89 05/04/2018   CHOL 200 11/26/2017   TRIG 163.0 (H) 11/26/2017   HDL 58.20 11/26/2017   LDLDIRECT 114.0 04/02/2010   LDLCALC 109 (H) 11/26/2017   ALT 17 05/04/2018   AST 15 05/04/2018   NA 139 05/04/2018   K 4.9 05/04/2018   CL 101 05/04/2018   CREATININE 0.70  05/04/2018   BUN 24 (H) 05/04/2018   CO2 28 05/04/2018   TSH 1.27 11/26/2017   INR 0.96 06/16/2017   HGBA1C 5.4 11/26/2017   BP Readings from Last 3 Encounters:  05/04/18 138/84  12/29/17 (!) 154/81  11/03/17 128/86   Wt Readings from Last 3 Encounters:  05/04/18 212 lb (96.2 kg)  12/29/17 207 lb (93.9 kg)  11/03/17 201 lb 4.8 oz (91.3 kg)    ASSESSMENT AND PLAN:  Discussed the following assessment and plan:  Arthritis of left knee - removal of  hardware planned   Medication management - Plan: CBC with Differential/Platelet, CMP, Iron, TIBC and Ferritin Panel  Essential hypertension - controlled at this time   - Plan: CBC with Differential/Platelet, CMP, Iron, TIBC and Ferritin Panel  Iron deficiency hx of   - better since gastric ulcer  rx  - Plan: CBC with Differential/Platelet, CMP, Iron, TIBC and Ferritin Panel  History of gastric ulcer - I will  rx the prilosec once a day at this time  - Plan: CBC with Differential/Platelet, CMP, Iron, TIBC and Ferritin Panel  Hyperlipidemia, unspecified hyperlipidemia type  High risk medication use - Plan: CBC with Differential/Platelet, CMP, Iron, TIBC and Ferritin Panel  Pre-op evaluation  Status post left knee replacement  Goiter - see prev records  from [place] lab today .  Surgical clearance   Acceptable to proceed with surgery  -Patient advised to return or notify health care team  if  new concerns arise.  Patient Instructions  No change in  medication  Lab today  Dr D should be able to see this.   Will review  Your past records but last thyroid   Was   september and normal.   Will refill meds today .   Will sent note to  Dr Maureen Ralphs  Plan annual visit in September  Or thereabout.   Standley Brooking. Spyridon Hornstein M.D.

## 2018-05-03 NOTE — Telephone Encounter (Signed)
Last Visit: 12/29/17 Next Visit: 06/01/18 Labs: 03/08/18 WNL TB gold: 11/11/17 Neg   Okay to refill per Dr. Sallyanne Havers

## 2018-05-04 ENCOUNTER — Ambulatory Visit (INDEPENDENT_AMBULATORY_CARE_PROVIDER_SITE_OTHER): Payer: Medicare Other | Admitting: Internal Medicine

## 2018-05-04 ENCOUNTER — Encounter: Payer: Self-pay | Admitting: Internal Medicine

## 2018-05-04 VITALS — BP 138/84 | HR 66 | Temp 97.9°F | Ht 64.0 in | Wt 212.0 lb

## 2018-05-04 DIAGNOSIS — Z96652 Presence of left artificial knee joint: Secondary | ICD-10-CM | POA: Diagnosis not present

## 2018-05-04 DIAGNOSIS — E611 Iron deficiency: Secondary | ICD-10-CM | POA: Diagnosis not present

## 2018-05-04 DIAGNOSIS — I10 Essential (primary) hypertension: Secondary | ICD-10-CM

## 2018-05-04 DIAGNOSIS — E049 Nontoxic goiter, unspecified: Secondary | ICD-10-CM | POA: Diagnosis not present

## 2018-05-04 DIAGNOSIS — Z01818 Encounter for other preprocedural examination: Secondary | ICD-10-CM

## 2018-05-04 DIAGNOSIS — E785 Hyperlipidemia, unspecified: Secondary | ICD-10-CM | POA: Diagnosis not present

## 2018-05-04 DIAGNOSIS — Z79899 Other long term (current) drug therapy: Secondary | ICD-10-CM

## 2018-05-04 DIAGNOSIS — M1712 Unilateral primary osteoarthritis, left knee: Secondary | ICD-10-CM

## 2018-05-04 DIAGNOSIS — Z8719 Personal history of other diseases of the digestive system: Secondary | ICD-10-CM

## 2018-05-04 DIAGNOSIS — Z8711 Personal history of peptic ulcer disease: Secondary | ICD-10-CM

## 2018-05-04 LAB — COMPREHENSIVE METABOLIC PANEL
ALT: 17 U/L (ref 0–35)
AST: 15 U/L (ref 0–37)
Albumin: 4.7 g/dL (ref 3.5–5.2)
Alkaline Phosphatase: 106 U/L (ref 39–117)
BUN: 24 mg/dL — ABNORMAL HIGH (ref 6–23)
CO2: 28 mEq/L (ref 19–32)
Calcium: 10 mg/dL (ref 8.4–10.5)
Chloride: 101 mEq/L (ref 96–112)
Creatinine, Ser: 0.7 mg/dL (ref 0.40–1.20)
GFR: 83.74 mL/min (ref >60.00)
Glucose, Bld: 89 mg/dL (ref 70–99)
Potassium: 4.9 mEq/L (ref 3.5–5.1)
Sodium: 139 mEq/L (ref 135–145)
Total Bilirubin: 0.7 mg/dL (ref 0.2–1.2)
Total Protein: 7.6 g/dL (ref 6.0–8.3)

## 2018-05-04 LAB — CBC WITH DIFFERENTIAL/PLATELET
Basophils Absolute: 0.1 10*3/uL (ref 0.0–0.1)
Basophils Relative: 1.1 % (ref 0.0–3.0)
Eosinophils Absolute: 0.1 10*3/uL (ref 0.0–0.7)
Eosinophils Relative: 1.1 % (ref 0.0–5.0)
HCT: 41.9 % (ref 36.0–46.0)
Hemoglobin: 14.6 g/dL (ref 12.0–15.0)
Lymphocytes Relative: 25.3 % (ref 12.0–46.0)
Lymphs Abs: 1.6 10*3/uL (ref 0.7–4.0)
MCHC: 34.9 g/dL (ref 30.0–36.0)
MCV: 96.1 fl (ref 78.0–100.0)
Monocytes Absolute: 0.3 10*3/uL (ref 0.1–1.0)
Monocytes Relative: 5.5 % (ref 3.0–12.0)
Neutro Abs: 4.2 10*3/uL (ref 1.4–7.7)
Neutrophils Relative %: 67 % (ref 43.0–77.0)
Platelets: 340 10*3/uL (ref 150.0–400.0)
RBC: 4.36 Mil/uL (ref 3.87–5.11)
RDW: 13 % (ref 11.5–15.5)
WBC: 6.3 10*3/uL (ref 4.0–10.5)

## 2018-05-04 MED ORDER — AMLODIPINE BESYLATE 5 MG PO TABS: 5.0000 mg | ORAL_TABLET | Freq: Every day | ORAL | 3 refills | Status: DC

## 2018-05-04 MED ORDER — LOVASTATIN 20 MG PO TABS: 20.0000 mg | ORAL_TABLET | Freq: Every evening | ORAL | 3 refills | Status: DC

## 2018-05-04 MED ORDER — LOSARTAN POTASSIUM 100 MG PO TABS: 100.0000 mg | ORAL_TABLET | Freq: Every day | ORAL | 3 refills | Status: DC

## 2018-05-04 MED ORDER — OMEPRAZOLE 40 MG PO CPDR: 40.0000 mg | DELAYED_RELEASE_CAPSULE | Freq: Every day | ORAL | 3 refills | Status: DC

## 2018-05-04 NOTE — Patient Instructions (Addendum)
No change in medication  Lab today  Dr D should be able to see this.   Will review  Your past records but last thyroid   Was   september and normal.   Will refill meds today .   Will sent note to  Dr Maureen Ralphs  Plan annual visit in September  Or thereabout.

## 2018-05-05 LAB — IRON,TIBC AND FERRITIN PANEL
%SAT: 23 % (calc) (ref 16–45)
Ferritin: 190 ng/mL (ref 16–288)
Iron: 79 ug/dL (ref 45–160)
TIBC: 345 mcg/dL (calc) (ref 250–450)

## 2018-05-20 ENCOUNTER — Telehealth: Payer: Self-pay | Admitting: Obstetrics & Gynecology

## 2018-05-20 NOTE — Telephone Encounter (Signed)
Left patient a message to call back to reschedule a future appointment that was cancelled by the provider. °

## 2018-05-26 ENCOUNTER — Ambulatory Visit: Payer: Medicare Other | Admitting: Obstetrics & Gynecology

## 2018-05-31 ENCOUNTER — Telehealth: Payer: Self-pay | Admitting: *Deleted

## 2018-05-31 ENCOUNTER — Encounter: Payer: Self-pay | Admitting: Rheumatology

## 2018-05-31 NOTE — Progress Notes (Deleted)
Virtual Visit via Telephone Note  I connected with Jade Cardenas on 05/31/18 at  1:45 PM EDT by telephone and verified that I am speaking with the correct person using two identifiers.   I discussed the limitations, risks, security and privacy concerns of performing an evaluation and management service by telephone and the availability of in person appointments. I also discussed with the patient that there may be a patient responsible charge related to this service. The patient expressed understanding and agreed to proceed.  CC:   History of Present Illness:   ROS Observations/Objective: Physical Exam  Assessment and Plan:   Follow Up Instructions:    I discussed the assessment and treatment plan with the patient. The patient was provided an opportunity to ask questions and all were answered. The patient agreed with the plan and demonstrated an understanding of the instructions.   The patient was advised to call back or seek an in-person evaluation if the symptoms worsen or if the condition fails to improve as anticipated.  I provided *** minutes of non-face-to-face time during this encounter.   Earnestine Mealing, CMA

## 2018-05-31 NOTE — Telephone Encounter (Signed)
Patient is scheduled for telemedicine visit on 06/01/2018

## 2018-05-31 NOTE — Telephone Encounter (Signed)
Received a fax from [facility] regarding a prior authorization for MTX . Authorization has been APPROVED from 03/02/2018 until further notice.   Will send document to scan center.

## 2018-05-31 NOTE — Telephone Encounter (Signed)
Received a Prior Authorization request from [facility] for MTX. Authorization has been submitted to patient's insurance via Cover My Meds. Will update once we receive a response.

## 2018-06-01 ENCOUNTER — Other Ambulatory Visit: Payer: Self-pay

## 2018-06-01 ENCOUNTER — Telehealth (INDEPENDENT_AMBULATORY_CARE_PROVIDER_SITE_OTHER): Payer: Medicare Other | Admitting: Rheumatology

## 2018-06-01 ENCOUNTER — Ambulatory Visit: Payer: Medicare Other | Admitting: Rheumatology

## 2018-06-01 ENCOUNTER — Encounter: Payer: Self-pay | Admitting: Rheumatology

## 2018-06-01 DIAGNOSIS — L4050 Arthropathic psoriasis, unspecified: Secondary | ICD-10-CM

## 2018-06-01 DIAGNOSIS — Z872 Personal history of diseases of the skin and subcutaneous tissue: Secondary | ICD-10-CM

## 2018-06-01 DIAGNOSIS — E611 Iron deficiency: Secondary | ICD-10-CM

## 2018-06-01 DIAGNOSIS — M5030 Other cervical disc degeneration, unspecified cervical region: Secondary | ICD-10-CM

## 2018-06-01 DIAGNOSIS — M8589 Other specified disorders of bone density and structure, multiple sites: Secondary | ICD-10-CM

## 2018-06-01 DIAGNOSIS — M1711 Unilateral primary osteoarthritis, right knee: Secondary | ICD-10-CM

## 2018-06-01 DIAGNOSIS — R5382 Chronic fatigue, unspecified: Secondary | ICD-10-CM

## 2018-06-01 DIAGNOSIS — M5136 Other intervertebral disc degeneration, lumbar region: Secondary | ICD-10-CM

## 2018-06-01 DIAGNOSIS — Z79899 Other long term (current) drug therapy: Secondary | ICD-10-CM

## 2018-06-01 DIAGNOSIS — Z8639 Personal history of other endocrine, nutritional and metabolic disease: Secondary | ICD-10-CM

## 2018-06-01 DIAGNOSIS — Z96652 Presence of left artificial knee joint: Secondary | ICD-10-CM

## 2018-06-01 DIAGNOSIS — Z8711 Personal history of peptic ulcer disease: Secondary | ICD-10-CM

## 2018-06-01 DIAGNOSIS — M51369 Other intervertebral disc degeneration, lumbar region without mention of lumbar back pain or lower extremity pain: Secondary | ICD-10-CM

## 2018-06-01 DIAGNOSIS — Z8719 Personal history of other diseases of the digestive system: Secondary | ICD-10-CM

## 2018-06-01 DIAGNOSIS — M797 Fibromyalgia: Secondary | ICD-10-CM

## 2018-06-01 NOTE — Patient Instructions (Signed)
Standing Labs We placed an order today for your standing lab work.    Please come back and get your standing labs in June and every 3 months   We have open lab Monday through Friday from 8:30-11:30 AM and 1:30-4:00 PM  at the office of Dr. Shaili Deveshwar.   You may experience shorter wait times on Monday and Friday afternoons. The office is located at [address], [address], [place], [place] [address] No appointment is necessary.   Labs are drawn by [facility].  You may receive a bill from [facility] for your lab work.  If you wish to have your labs drawn at another location, please call the office 24 hours in advance to send orders.  If you have any questions regarding directions or hours of operation,  please call [phone].   Just as a reminder please drink plenty of water prior to coming for your lab work. Thanks!   

## 2018-06-01 NOTE — Progress Notes (Signed)
Virtual Visit via Telephone Note  I connected with Jade Cardenas on 06/01/18 at  1:45 PM EDT by telephone and verified that I am speaking with the correct person using two identifiers.   I discussed the limitations, risks, security and privacy concerns of performing an evaluation and management service by telephone and the availability of in person appointments. I also discussed with the patient that there may be a patient responsible charge related to this service. The patient expressed understanding and agreed to proceed.  CC: Pain in multiple joints   History of Present Illness: Patient is a 68 year old female with a past medial history of psoriatic arthritis, osteoarthritis, fibromyalgia, and DDD.  She is on Simponi sq monthly injections, MTX 0.3 ml sq once weekly, and folic acid 1 mg po daily. She missed last weeks dose of MTX. She is currently having a psoriatic flare.  She states she is under increased stress, which she feels is contributing to the flare. She is having right ankle joint and right foot pain, stiffness, and swelling.  She is having pain in both hands and both knee joints. She denies any joint swelling in hands or knees. She is having increased difficulty opening jars and decreased grip strength.  She is also having increased morning stiffness.      Review of Systems  Constitutional: Positive for malaise/fatigue. Negative for fever.  Eyes: Negative for photophobia, pain and redness.  Respiratory: Negative for cough, shortness of breath and wheezing.   Cardiovascular: Negative for chest pain and palpitations.  Gastrointestinal: Negative for blood in stool, constipation and diarrhea.  Genitourinary: Negative for dysuria.  Musculoskeletal: Positive for joint pain. Negative for back pain, myalgias and neck pain.  Skin: Negative for rash.  Neurological: Negative for dizziness and headaches.  Psychiatric/Behavioral: Negative for depression. The patient has insomnia. The  patient is not nervous/anxious.    Observations/Objective: Physical Exam  Constitutional: She is oriented to person, place, and time.  Neurological: She is alert and oriented to person, place, and time.  Psychiatric: Mood, memory, affect and judgment normal.  No psoriasis at this time   Patient reports nocturnal pain.  Difficulty dressing/grooming: Denies Difficulty climbing stairs: Reports Difficulty getting out of chair: Reports Difficulty using hands for taps, buttons, cutlery, and/or writing: Reports  Assessment and Plan: Psoriatic arthritis ([place]):  She is currently having a psoriatic arthritis flare. She is under increased stress due to the coronavirus pandemic. She is having right ankle joint pain and joint swelling.  She is having increased pain and stiffness in both hands and both knee joints.  She is on Simponi sq injections once monthly, MTX 0.3 ml once weekly, and folic acid 1 mg by mouth daily.  She missed last weeks dose of MTX but will be resuming this weekend.  She requested to increase her dose of MTX to 0.5 ml sq this week due to flaring and then resume 0.3 ml sq once weekly every week after.  She declined a prednisone taper at this time.  She is taking tylenol for pain relief. She was advised to notify us if she develops increased joint pain or joint swelling. She will follow up in 3 months.   High risk medication use - Simponi subcutaneous monthly injections, methotrexate 0.3 mL subcutaneous injections weekly, folic acid 1 mg p.o. daily (Arava and Otezla caused diarrhea). CBC and CMP were WNL on 05/04/18.  She will be due for lab work in June.  TB gold negative on 11/11/17. She  is up to date on the yearly influenza vaccine, pneumonia vaccine, and shingles vaccination.  She has not had any recent infections.  She is aware that she should hold her dose of MTX and Simponi if she has an infection.   Hx of psoriasis:She has no psoriasis at this time.   Primary osteoarthritis of  right knee: She has occasional discomfort in the right knee joint.  No joint swelling.   Total knee replacement status, left - Dr. Maureen Ralphs: Chronic pain. She continues to walk with a cane.  Fibromyalgia:  She has generalized muscle aches and muscle tenderness.   She has been sleeping about 6 hours per night.  Her level of fatigue has been manageable lately. She was encouraged to stay active and exercise on a regular basis.   She has been walking around her neighborhood several times per day for exercise.    Chronic fatigue: She has chronic fatigue related to insomnia.  She was encouraged to continue to stay active and exercise on a regular basis.   DDD (degenerative disc disease), cervical - S/P fusion: No symptoms of radiculopathy at this time.   DDD (degenerative disc disease), lumbar - S/P fusion: Chronic pain and stiffness.   Osteopenia of multiple sites - DEXA 09/15/17 osteopenia T -1.8.  She takes a vitamin D supplement.   Follow Up Instructions: She will follow up in our office in 3 months.    I discussed the assessment and treatment plan with the patient. The patient was provided an opportunity to ask questions and all were answered. The patient agreed with the plan and demonstrated an understanding of the instructions.   The patient was advised to call back or seek an in-person evaluation if the symptoms worsen or if the condition fails to improve as anticipated.  I provided 30 minutes of non-face-to-face time during this encounter. Bo Merino, MD   Scribed by-  Ofilia Neas, PA-C

## 2018-06-03 ENCOUNTER — Telehealth: Payer: Self-pay | Admitting: Rheumatology

## 2018-06-03 NOTE — Telephone Encounter (Signed)
We did not discuss the use of amoxicillin.  She might be confused between the PCP and our conversation.  She should contact her PCP for the antibiotic usage.  If she believes that she has an infection then she should hold Simponi and methotrexate until the infection clears up.

## 2018-06-03 NOTE — Telephone Encounter (Signed)
Patient would like to know if doctor could send in a [facility] [address]. Patient started taking old rx for bruise on leg, and does not have enough for 7 to 10 days. Patient started it on Wednesday. Please call to advise. Patient mentioned this to doctor on virtual appt, and was advised by doctor  starting antibiotic  Was a good idea.

## 2018-06-03 NOTE — Telephone Encounter (Signed)
Patient advised we did not discuss the use of amoxicillin.  She might be confused between the PCP and our conversation.  She should contact her PCP for the antibiotic usage.  If she believes that she has an infection then she should hold Simponi and methotrexate until the infection clears up. Patient verbalized understanding.

## 2018-06-03 NOTE — Telephone Encounter (Signed)
I LMOM for patient to call, and schedule a fu appt in July 2020.

## 2018-06-03 NOTE — Telephone Encounter (Signed)
-----   Message from [place] sent at 06/01/2018  3:29 PM EDT ----- Patient had a virtual visit today with Dr. Estanislado Pandy. Please call to schedule 3 month follow up. Thanks!

## 2018-06-20 ENCOUNTER — Ambulatory Visit: Admit: 2018-06-20 | Payer: Medicare Other | Admitting: Orthopedic Surgery

## 2018-06-20 SURGERY — REMOVAL, HARDWARE
Anesthesia: Choice | Laterality: Left

## 2018-06-21 ENCOUNTER — Other Ambulatory Visit: Payer: Self-pay | Admitting: Rheumatology

## 2018-06-21 NOTE — Telephone Encounter (Signed)
Last Visit: 06/01/2018 telemedicine  Next Visit: 07/14/2018 Labs: 05/04/2018    Okay to refill per Dr. Estanislado Pandy.

## 2018-07-07 ENCOUNTER — Ambulatory Visit (INDEPENDENT_AMBULATORY_CARE_PROVIDER_SITE_OTHER): Payer: Medicare Other | Admitting: Podiatry

## 2018-07-07 ENCOUNTER — Other Ambulatory Visit: Payer: Self-pay

## 2018-07-07 ENCOUNTER — Encounter: Payer: Self-pay | Admitting: Podiatry

## 2018-07-07 ENCOUNTER — Ambulatory Visit (INDEPENDENT_AMBULATORY_CARE_PROVIDER_SITE_OTHER): Payer: Medicare Other

## 2018-07-07 VITALS — Temp 97.2°F

## 2018-07-07 DIAGNOSIS — M7731 Calcaneal spur, right foot: Secondary | ICD-10-CM

## 2018-07-07 DIAGNOSIS — M779 Enthesopathy, unspecified: Secondary | ICD-10-CM

## 2018-07-07 DIAGNOSIS — M7751 Other enthesopathy of right foot: Secondary | ICD-10-CM

## 2018-07-11 NOTE — Progress Notes (Signed)
Office Visit Note  Patient: Jade Cardenas             Date of Birth: [birth year]           MRN: [id number]             PCP: [name], MD Referring: [name], MD Visit Date: 07/14/2018 Occupation: @GUAROCC @  Subjective:  Right ankle joint pain  History of Present Illness: Jade Cardenas is a 68 y.o. female with history of psoriatic arthritis, osteoarthritis, DDD, and fibromyalgia.  She is on Simponi sq monthly injections, MTX 0.5 ml sq weekly injections, and folic acid 1 mg po daily.  She had a virtual visit on 06/01/18 due to experiencing a psoriatic arthritis flare in the right ankle.  She was evaluated by Dr. Jacqualyn Posey and had a X-ray that did not reveal fracture. She had a right sinus tarsi cortisone injection on 07/07/18.  She tried wearing a tri-lock brace but it did not help.  She states she continues to have right wrist pain but the joint swelling has subsided.  She has chronic left knee pain.  She is having left knee hardware removal surgery performed by Dr. Wynelle Link on 08/01/18.  She reports increased fatigue.  She will be reaching to her hematologist to discuss iron infusions.      Activities of Daily Living:  Patient reports morning stiffness for 2 hours.   Patient Denies nocturnal pain.  Difficulty dressing/grooming: Denies Difficulty climbing stairs: Reports Difficulty getting out of chair: Reports Difficulty using hands for taps, buttons, cutlery, and/or writing: Reports  Review of Systems  Constitutional: Positive for fatigue.  HENT: Positive for mouth dryness.   Eyes: Positive for dryness.  Respiratory: Negative for cough, hemoptysis, shortness of breath and difficulty breathing.   Cardiovascular: Positive for swelling in legs/feet. Negative for chest pain, palpitations and hypertension.  Gastrointestinal: Negative for blood in stool, constipation and diarrhea.  Endocrine: Negative for excessive thirst.  Genitourinary: Negative for difficulty urinating.   Musculoskeletal: Positive for arthralgias, joint pain, joint swelling, morning stiffness and muscle tenderness. Negative for myalgias, muscle weakness and myalgias.  Skin: Negative for rash, hair loss and sensitivity to sunlight.  Allergic/Immunologic: Negative for susceptible to infections.  Neurological: Negative for dizziness, numbness, headaches and weakness.  Hematological: Negative for bruising/bleeding tendency.  Psychiatric/Behavioral: Positive for sleep disturbance. Negative for depressed mood.    PMFS History:  Patient Active Problem List   Diagnosis Date Noted  . History of total knee replacement, left 06/28/2017  . OA (osteoarthritis) of knee 06/21/2017  . Varicose veins of bilateral lower extremities with other complications 92/42/6834  . DDD (degenerative disc disease), lumbar 10/19/2016  . Goiter 08/28/2016  . Family history of malignant neoplasm of digestive organ 08/27/2016  . 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 Fibromyalgia 05/17/2016  . High risk medication use 02/18/2016  . DJD (degenerative joint disease), cervical 02/18/2016  . Heel spur 09/29/2015  . Ingrown toenail 09/29/2015  . Foraminal stenosis of cervical region 07/30/2015  . Microcytic hypochromic anemia 05/22/2015  . Tailor's bunion, left 01/18/2015  . H/O hiatal hernia   . Bilateral primary osteoarthritis of knee 12/15/2013  . Iron deficiency 12/15/2013  . Psoriatic arthritis ([place]) 12/15/2013  . History of foot fracture 12/15/2013  . Dermatitis 10/17/2013  . Arthritis 05/31/2013  . 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 Skin lesion 12/09/2012  . Tinnitus 06/23/2012  . Scalp lesion 01/21/2012  . Headache(784.0) 01/21/2012  . Hx of  psoriasis 01/21/2012  . Weight gain 01/21/2012  . Ankylosing spondylitis  possible 09/03/2011  . Hx of transfusion 09/03/2011  . S/P lumbar fusion 6 13 09/03/2011  . Lumbar stenosis with neurogenic claudication 08/05/2011  . LBP (low back  pain) 01/17/2011  . ALLERGIC RHINITIS 02/19/2010  . GERD 02/19/2010  . ARTHRITIS 02/19/2010  . SPINAL STENOSIS, CERVICAL 02/19/2010  . OSTEOPENIA 02/19/2010  . RASH AND OTHER NONSPECIFIC SKIN ERUPTION 12/07/2007  . IBS 04/28/2007  . History of gastric ulcer 02/09/2007  . COLONIC POLYPS, HX OF 02/09/2007  . CHOLECYSTECTOMY, HX OF 02/09/2007  . Hyperlipidemia 12/29/2006  . Essential hypertension 12/29/2006    Past Medical History:  Diagnosis Date  . Anemia    iron def  . Arthritis    rheumatoid and osteo now flt to be psoriateic   . Arthritis    psoreratic, osteoarthritis  . Broken toe   . Bruises easily   . CHOLECYSTECTOMY, HX OF   . Chronic back pain   . COLONIC POLYPS, HX OF 02/09/2007  . Cough, persistent 01/21/2012   poss from acei   . 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 Fibromyalgia    doesn't require meds  . GASTRIC ULCER, ACUTE, HEMORRHAGE, HX OF 02/09/2007  . GERD (gastroesophageal reflux disease)    takes Nexium daily  . Goiter   . H/O hiatal hernia   . Headache(784.0)    occasionally  . History of bronchitis   . Hx of seasonal allergies    takes Claritin daily  . HYPERLIPIDEMIA 12/29/2006   takes Lovastatin nightly  . HYPERTENSION 12/29/2006   takes Enalapril daily  . IBS 04/28/2007  . Insomnia    related to pain;takes Flexeril and Tylenol PM nightly  . Joint pain   . Joint swelling   . Palpitations 02/09/2007  . PONV (postoperative nausea and vomiting)    NAUSEA  . REDUCTION MAMMOPLASTY, HX OF 02/09/2007  . Rheumatoid arthritis(714.0) 12/29/2006  . Right bundle branch block 02/09/2007  . S/P lumbar fusion 6 13 09/03/2011   L4 L5  posterior   . SYNCOPE 12/07/2007  . UNS ADVRS EFF OTH RX MEDICINAL\T\BIOLOGICAL SBSTNC 02/09/2007    Family History  Problem Relation Age of Onset  . Diabetes Mother   . Colon cancer Mother        x2  . Arthritis Mother   . Hypertension Mother   . Endometrial cancer Mother   . Stroke Father   . Heart disease Father    . Colon cancer Father   . Arthritis Brother   . Hypertension Brother   . Hyperlipidemia Brother   . Infertility Brother   . Other Other        DISH brother  . Hypertension Maternal Grandmother   . Stroke Maternal Grandmother   . Anesthesia problems Neg Hx   . Hypotension Neg Hx   . Malignant hyperthermia Neg Hx   . Pseudochol deficiency Neg Hx    Past Surgical History:  Procedure Laterality Date  . BACK SURGERY  12+yrs ago   Synovial Cyst removal  . BACK SURGERY  6/13   lumbar L4-L5 fusion, lamenectomy  . bone spur  6-8yrs ago   right ankle  . BUNIONECTOMY  10/03/14   right foot  . CERVICAL FUSION     2 12   . CHOLECYSTECTOMY  1996  . JOINT REPLACEMENT    . KNEE ARTHROPLASTY    . POSTERIOR CERVICAL LAMINECTOMY Left 07/30/2015   Procedure: Laminectomy and Foraminotomy -  left - Cervical two -Cervical three;  Surgeon: Earnie Larsson, MD;  Location: [facility];  Service: Neurosurgery;  Laterality: Left;  . REDUCTION MAMMAPLASTY Bilateral 1989  . TONSILLECTOMY     as a child  . TOTAL ABDOMINAL HYSTERECTOMY W/ BILATERAL SALPINGOOPHORECTOMY  1986  . 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 Influenza Split 11/29/2012, 11/27/2013  . Influenza Whole 11/09/2008  . Influenza, High Dose Seasonal PF 11/26/2015, 11/30/2016, 11/03/2017  . Influenza,inj,Quad PF,6+ Mos 11/27/2014  . Influenza-Unspecified 12/22/2016  . PPD Test 12/18/2013, 11/27/2014, 11/26/2015, 11/30/2016  . Pneumococcal Conjugate-13 02/28/2013  . Pneumococcal Polysaccharide-23 03/03/2003, 02/28/2014  . Td  05/09/2008  . Zoster 01/20/2011  . Zoster Recombinat (Shingrix) 12/03/2016, 02/04/2017     Objective: Vital Signs: BP (!) 142/65 (BP Location: Left Arm, Patient Position: Sitting, Cuff Size: Normal)   Pulse 74   Resp 16   Ht 5\' 4"  (1.626 m)   Wt 216 lb 3.2 oz (98.1 kg)   LMP 03/02/1984   BMI 37.11 kg/m    Physical Exam Vitals signs and nursing note reviewed.  Constitutional:      Appearance: She is well-developed.  HENT:     Head: Normocephalic and atraumatic.  Eyes:     Conjunctiva/sclera: Conjunctivae normal.  Neck:     Musculoskeletal: Normal range of motion.  Cardiovascular:     Rate and Rhythm: Normal rate and regular rhythm.     Heart sounds: Normal heart sounds.  Pulmonary:     Effort: Pulmonary effort is normal.     Breath sounds: Normal breath sounds.  Abdominal:     General: Bowel sounds are normal.     Palpations: Abdomen is soft.  Lymphadenopathy:     Cervical: No cervical adenopathy.  Skin:    General: Skin is warm and dry.     Capillary Refill: Capillary refill takes less than 2 seconds.  Neurological:     Mental Status: She is alert and oriented to person, place, and time.  Psychiatric:        Behavior: Behavior normal.      Musculoskeletal Exam: Limited ROM of C-spine.  Thoracic and lumbar spine good ROM.  No midline spinal tenderness.  No SI joint tenderness.  Shoulder joints, elbow joints, wrist joints, MCPs, PIPs, and DIPs good ROM with no synovitis.  PIP and DIP synovial thickening.  Bilateral CMC joint synovial thickening.  Hip joints good ROM.  Left knee warmth and painful ROM.  Right knee good ROM with no warmth or effusion.  No tenderness or swelling of ankle joints.  PIP and DIP synovial thickening consistent with OA of feet.   CDAI Exam: CDAI Score: Not documented Patient Global Assessment: Not documented; Provider Global Assessment: Not documented Swollen: Not documented; Tender: Not documented Joint Exam   Not documented   There is  currently no information documented on the homunculus. Go to the Rheumatology activity and complete the homunculus joint exam.  Investigation: No additional findings.  Imaging: Dg Foot Complete Right  Result Date: 07/08/2018 Please see detailed radiograph report in office note.   Recent  Labs: Lab Results  Component Value Date   WBC 6.3 05/04/2018   HGB 14.6 05/04/2018   PLT 340.0 05/04/2018   NA 139 05/04/2018   K 4.9 05/04/2018   CL 101 05/04/2018   CO2 28 05/04/2018   GLUCOSE 89 05/04/2018   BUN 24 (H) 05/04/2018   CREATININE 0.70 05/04/2018   BILITOT 0.7 05/04/2018   ALKPHOS 106 05/04/2018   AST 15 05/04/2018   ALT 17 05/04/2018   PROT 7.6 05/04/2018   ALBUMIN 4.7 05/04/2018   CALCIUM 10.0 05/04/2018   GFRAA 107 03/08/2018   QFTBGOLDPLUS NEGATIVE 11/11/2017    Speciality Comments: Failed therapy: Arava and Otezla due to diarrhea  Procedures:  No procedures performed Allergies: Erythromycin; Iodinated diagnostic agents; Ace inhibitors; Morphine and related; Keflex [cephalexin]; and Macrobid [nitrofurantoin]     Assessment / Plan:     Visit Diagnoses: Psoriatic arthritis ([place]) -She has no synovitis or dactylitis on exam today.  She had psoriatic arthritis flare on 06/01/2018.  She was advised to increase her dose of methotrexate from 0.3 to 0.5 ml sq once weekly.  She continues to inject Simponi subcu once monthly.  Her right ankle has good range of motion with no tenderness or synovitis on exam today.  She was evaluated by Dr. Jacqualyn Posey on 07/07/2018 performed a right ankle cortisone injection, which resolved the joint pain and swelling.  She has chronic left knee pain and will be having hardware removal surgery on 08/01/2018 performed by Dr. Wynelle Link.  She has no other joint pain or joint swelling at this time.  She has no Achilles tendinitis or plantar fasciitis.  She has no SI joint tenderness.  She will continue on Simponi monthly injections, methotrexate 0.5 mL subcutaneous  weekly injections, and folic acid 1 mg by mouth daily.  She does not need any refills at this time. She was advised to notify us if she does increase joint pain or joint swelling.  She will follow-up in the office in 5 months.  High risk medication use - Simponi subcutaneous monthly injections, methotrexate 0.5 ml subcutaneous injections weekly, folic acid 1 mg p.o. daily (Arava and Otezla caused diarrhea).  CBC and CMP were drawn on 05/04/2018.  She is due to update lab work since she recently increased the dose of methotrexate from 0.3 to 0.5 mg once weekly.  Standing orders are in place.  Future order for ferritin was placed per request of the patient.  TB gold was negative on 11/11/2017.  Hx of psoriasis: She has no psoriasis at this time.  Primary osteoarthritis of right knee: Doing well.  No warmth or effusion.  She has good range of motion with no discomfort.  Total knee replacement status, left - Dr. Michel Bickers is scheduled for hardware removal on 08/01/18.  She was advised to hold Simponi and MTX 2 weeks prior to surgery.   Fibromyalgia: She continues to have generalized muscle aches and muscle tenderness due to fibromyalgia.  She has been experiencing worsening fatigue recently.  She attributes the increased fatigue to chronic anemia.  She would like to have her ferritin checked with her upcoming lab work.  She was encouraged to stay active and exercise on a regular basis.  Good sleep hygiene was discussed.  Chronic fatigue: She has been experiencing worsening fatigue over the past several weeks.  She would like her ferritin level checked with her upcoming lab work.  Future order was placed today.  Osteopenia of multiple sites - DEXA 09/15/17 osteopenia T -1.8.  She takes a vitamin D supplement.   DDD (degenerative disc disease), cervical - S/P fusion: She has limited range of motion of her C-spine.  She is no discomfort at this time.  She has no symptoms of radiculopathy.  DDD (degenerative  disc disease), lumbar - S/P fusion: She has intermittent lower back pain.  Orders: Orders Placed This Encounter  Procedures  . Ferritin   No orders of the defined types were placed in this encounter.   Face-to-face time spent with patient was 30 minutes. Greater than 50% of time was spent in counseling and coordination of care.  Follow-Up Instructions: Return in about 5 months (around 12/14/2018) for Psoriatic arthritis, Fibromyalgia.   Ofilia Neas, PA-C   I examined and evaluated the patient with Hazel Sams PA.  Patient has been doing better on methotrexate and Simponi combination.  Her methotrexate dose was increased after having a flare.  She also had right ankle cortisone injection by her podiatrist which has been helpful.  He plan of care was discussed as noted above.  Bo Merino, MD  Note - This record has been created using Editor, commissioning.  Chart creation errors have been sought, but may not always  have been located. Such creation errors do not reflect on  the standard of medical care.

## 2018-07-11 NOTE — Progress Notes (Signed)
Subjective: 68 year old female presents the office today for concerns of right ankle pain.  She states that she does not think is chronic fasciitis which is getting pain more to the outside aspect of her foot and ankle.  She denies any recent injury or trauma.  She has some occasional swelling.  No recent treatment.  No other concerns. Denies any systemic complaints such as fevers, chills, nausea, vomiting. No acute changes since last appointment, and no other complaints at this time.   Objective: AAO x3, NAD DP/PT pulses palpable bilaterally, CRT less than 3 seconds There is tenderness palpation of the lateral aspect of the right foot on the sinus tarsi and there is localized edema in this area.  There is also mild discomfort along the course of the peroneal tendon just posterior and inferior to the lateral malleolus.  Peroneal tendon appears intact.  No pain with Achilles tendon, plantar fascial.  No other areas of tenderness elicited at this time. No open lesions or pre-ulcerative lesions.  No pain with calf compression, swelling, warmth, erythema  Assessment: Tendinitis, capsulitis right foot  Plan: -All treatment options discussed with the patient including all alternatives, risks, complications.  -X-rays were obtained reviewed there is no evidence of acute fracture or stress fracture. -Steroid injection performed to the right sinus tarsi.  See procedure note below -Tri-Lock ankle brace dispensed. -Ice -Supportive shoes  Procedure: Injection intermediate joint Discussed alternatives, risks, complications and verbal consent was obtained.  Location: Right sinus tarsi. Skin Prep: Betadine Injectate: 0.5cc 0.5% marcaine plain, 0.5 cc 2% lidocaine plain and, 1 cc kenalog 10. Disposition: Patient tolerated procedure well. Injection site dressed with a band-aid.  Post-injection care was discussed and return precautions discussed.   Trula Slade DPM

## 2018-07-14 ENCOUNTER — Ambulatory Visit (INDEPENDENT_AMBULATORY_CARE_PROVIDER_SITE_OTHER): Payer: Medicare Other | Admitting: Rheumatology

## 2018-07-14 ENCOUNTER — Other Ambulatory Visit: Payer: Self-pay

## 2018-07-14 ENCOUNTER — Encounter: Payer: Self-pay | Admitting: Rheumatology

## 2018-07-14 VITALS — BP 142/65 | HR 74 | Resp 16 | Ht 64.0 in | Wt 216.2 lb

## 2018-07-14 DIAGNOSIS — M5136 Other intervertebral disc degeneration, lumbar region: Secondary | ICD-10-CM | POA: Diagnosis not present

## 2018-07-14 DIAGNOSIS — Z79899 Other long term (current) drug therapy: Secondary | ICD-10-CM

## 2018-07-14 DIAGNOSIS — M1711 Unilateral primary osteoarthritis, right knee: Secondary | ICD-10-CM | POA: Diagnosis not present

## 2018-07-14 DIAGNOSIS — M797 Fibromyalgia: Secondary | ICD-10-CM

## 2018-07-14 DIAGNOSIS — M5030 Other cervical disc degeneration, unspecified cervical region: Secondary | ICD-10-CM

## 2018-07-14 DIAGNOSIS — Z872 Personal history of diseases of the skin and subcutaneous tissue: Secondary | ICD-10-CM | POA: Diagnosis not present

## 2018-07-14 DIAGNOSIS — M8589 Other specified disorders of bone density and structure, multiple sites: Secondary | ICD-10-CM

## 2018-07-14 DIAGNOSIS — R5382 Chronic fatigue, unspecified: Secondary | ICD-10-CM | POA: Diagnosis not present

## 2018-07-14 DIAGNOSIS — Z96652 Presence of left artificial knee joint: Secondary | ICD-10-CM | POA: Diagnosis not present

## 2018-07-14 DIAGNOSIS — Z862 Personal history of diseases of the blood and blood-forming organs and certain disorders involving the immune mechanism: Secondary | ICD-10-CM | POA: Diagnosis not present

## 2018-07-14 DIAGNOSIS — L4050 Arthropathic psoriasis, unspecified: Secondary | ICD-10-CM | POA: Diagnosis not present

## 2018-07-18 ENCOUNTER — Other Ambulatory Visit: Payer: Self-pay

## 2018-07-19 ENCOUNTER — Other Ambulatory Visit (HOSPITAL_COMMUNITY)
Admission: RE | Admit: 2018-07-19 | Discharge: 2018-07-19 | Disposition: A | Discharge status: Home / Self Care | Payer: Medicare Other | Source: Ambulatory Visit | Attending: Obstetrics & Gynecology | Admitting: Obstetrics & Gynecology

## 2018-07-19 ENCOUNTER — Encounter: Payer: Self-pay | Admitting: Obstetrics & Gynecology

## 2018-07-19 ENCOUNTER — Ambulatory Visit (INDEPENDENT_AMBULATORY_CARE_PROVIDER_SITE_OTHER): Payer: Medicare Other | Admitting: Obstetrics & Gynecology

## 2018-07-19 VITALS — BP 146/92 | HR 84 | Temp 97.5°F | Ht 63.75 in | Wt 215.0 lb

## 2018-07-19 DIAGNOSIS — Z23 Encounter for immunization: Secondary | ICD-10-CM | POA: Diagnosis not present

## 2018-07-19 DIAGNOSIS — Z124 Encounter for screening for malignant neoplasm of cervix: Secondary | ICD-10-CM | POA: Insufficient documentation

## 2018-07-19 DIAGNOSIS — Z01419 Encounter for gynecological examination (general) (routine) without abnormal findings: Secondary | ICD-10-CM

## 2018-07-19 NOTE — Progress Notes (Signed)
68 y.o. G0P0000 Widowed White or Caucasian female here for annual exam.  Denies vaginal bleeding.    Having hardware in left knee removed by Dr. Maureen Ralphs on August 01, 2018.    Patient's last menstrual period was 03/02/1984.          Sexually active: No.  The current method of family planning is status post hysterectomy.    Exercising: Yes.     Smoker:  no  Health Maintenance: Pap:  03/15/17 neg   03/13/16 Neg. HR HPV:neg  History of abnormal Pap:  Yes, DES exposure  MMG:  09/15/17 BIRADS1:Neg Colonoscopy:  06/13/15 f/u 5 years, follow up 5 years BMD:   09/15/17 Osteopenia  TDaP:  2010 Pneumonia vaccine(s): done Shingrix:   Completed  Hep C testing: 11/16/14 neg  Screening Labs: PCP   reports that she has never smoked. She has never used smokeless tobacco. She reports that she does not drink alcohol or use drugs.  Past Medical History:  Diagnosis Date  . Anemia    iron def  . Arthritis    rheumatoid and osteo now flt to be psoriateic   . Arthritis    psoreratic, osteoarthritis  . Broken toe   . Bruises easily   . CHOLECYSTECTOMY, HX OF   . Chronic back pain   . COLONIC POLYPS, HX OF 02/09/2007  . Cough, persistent 01/21/2012   poss from acei   . 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 Femur fracture, left ([place])   . Fibromyalgia    doesn't require meds  . GASTRIC ULCER, ACUTE, HEMORRHAGE, HX OF 02/09/2007  . GERD (gastroesophageal reflux disease)    takes Nexium daily  . Goiter   . H/O hiatal hernia   . Headache(784.0)    occasionally  . History of bronchitis   . Hx of seasonal allergies    takes Claritin daily  . HYPERLIPIDEMIA 12/29/2006   takes Lovastatin nightly  . HYPERTENSION 12/29/2006   takes Enalapril daily  . IBS 04/28/2007  . Insomnia    related to pain;takes Flexeril and Tylenol PM nightly  . Joint pain   . Joint swelling   . Palpitations 02/09/2007  . PONV (postoperative nausea and vomiting)    NAUSEA  . REDUCTION MAMMOPLASTY, HX OF 02/09/2007  .  Rheumatoid arthritis(714.0) 12/29/2006  . Right bundle branch block 02/09/2007  . S/P lumbar fusion 6 13 09/03/2011   L4 L5  posterior   . SYNCOPE 12/07/2007  . UNS ADVRS EFF OTH RX MEDICINAL\T\BIOLOGICAL SBSTNC 02/09/2007    Past Surgical History:  Procedure Laterality Date  . BACK SURGERY  12+yrs ago   Synovial Cyst removal  . BACK SURGERY  6/13   lumbar L4-L5 fusion, lamenectomy  . bone spur  6-31yrs ago   right ankle  . BUNIONECTOMY  10/03/14   right foot  . CERVICAL FUSION     2 12   . CHOLECYSTECTOMY  1996  . JOINT REPLACEMENT    . KNEE ARTHROPLASTY    . POSTERIOR CERVICAL LAMINECTOMY Left 07/30/2015   Procedure: Laminectomy and Foraminotomy - left - Cervical two -Cervical three;  Surgeon: Earnie Larsson, MD;  Location: [facility];  Service: Neurosurgery;  Laterality: Left;  . REDUCTION MAMMAPLASTY Bilateral 1989  . TONSILLECTOMY     as a child  . TOTAL ABDOMINAL HYSTERECTOMY W/ BILATERAL SALPINGOOPHORECTOMY  1986  . TOTAL KNEE ARTHROPLASTY N/A 06/21/2017   Procedure: LEFT TOTAL KNEE ARTHROPLASTY AND RIGHT KNEE CORTISONE INJECTION;  Surgeon: Gaynelle Arabian, MD;  Location: [facility];  Service: Orthopedics;  Laterality: N/A;    Current Outpatient Medications  Medication Sig Dispense Refill  . amLODipine (NORVASC) 5 MG tablet Take 1 tablet (5 mg total) by mouth daily. 90 tablet 3  . B-D ALLERGY SYRINGE 1CC/28G 28G X 1/2" 1 ML MISC USE AS DIRECTED ONCE WEEKLY 12 each 4  . Cholecalciferol (VITAMIN D PO) Vitamin D    . Diphenhydramine-APAP, sleep, (TYLENOL PM EXTRA STRENGTH PO) Take 2 tablets by mouth at bedtime as needed. For sleep    . losartan (COZAAR) 100 MG tablet Take 1 tablet (100 mg total) by mouth daily. 90 tablet 3  . lovastatin (MEVACOR) 20 MG tablet Take 1 tablet (20 mg total) by mouth every evening. 90 tablet 3  . methotrexate 50 MG/2ML injection INJECT 0.3 ML INTO THE SKIN ONCE A WEEK. (Patient taking differently: 50 mg/m2 once a week. ) 4 mL 0  . Multiple Vitamins-Minerals  (MULTIVITAMIN ADULTS PO) multivitamin    . omeprazole (PRILOSEC) 40 MG capsule Take 1 capsule (40 mg total) by mouth daily. 90 capsule 3  . SIMPONI 50 MG/0.5ML SOAJ INJECT 50MG  (1 PEN) UNDER THE SKIN ONCE MONTHLY 3 Syringe 0  . amoxicillin (AMOXIL) 500 MG capsule TAKE 4 CAPSULES 1 HOUR BEFORE APPOINTMENT     No current facility-administered medications for this visit.     Family History  Problem Relation Age of Onset  . Diabetes Mother   . Colon cancer Mother        x2  . Arthritis Mother   . Hypertension Mother   . Endometrial cancer Mother   . Stroke Father   . Heart disease Father   . Colon cancer Father   . Arthritis Brother   . Hypertension Brother   . Hyperlipidemia Brother   . Infertility Brother   . Other Other        DISH brother  . Hypertension Maternal Grandmother   . Stroke Maternal Grandmother   . Anesthesia problems Neg Hx   . Hypotension Neg Hx   . Malignant hyperthermia Neg Hx   . Pseudochol deficiency Neg Hx     Review of Systems  All other systems reviewed and are negative.   Exam:   BP (!) 146/92   Pulse 84   Temp (!) 97.5 F (36.4 C) (Temporal)   Ht 5' 3.75" (1.619 m)   Wt 215 lb (97.5 kg)   LMP 03/02/1984   BMI 37.19 kg/m   Height: 5' 3.75" (161.9 cm)  Ht Readings from Last 3 Encounters:  07/19/18 5' 3.75" (1.619 m)  07/14/18 5\' 4"  (1.626 m)  05/04/18 5\' 4"  (1.626 m)    General appearance: alert, cooperative and appears stated age Head: Normocephalic, without obvious abnormality, atraumatic Neck: no adenopathy, supple, symmetrical, trachea midline and thyroid normal to inspection and palpation Lungs: clear to auscultation bilaterally Breasts: normal appearance, no masses or tenderness Heart: regular rate and rhythm Abdomen: soft, non-tender; bowel sounds normal; no masses,  no organomegaly Extremities: extremities normal, atraumatic, no cyanosis or edema Skin: Skin color, texture, turgor normal. No rashes or lesions Lymph nodes:  Cervical, supraclavicular, and axillary nodes normal. No abnormal inguinal nodes palpated Neurologic: Grossly normal   Pelvic: External genitalia:  no lesions              Urethra:  normal appearing urethra with no masses, tenderness or lesions              Bartholins and Skenes: normal  Vagina: normal appearing vagina with normal color and discharge, no lesions              Cervix: absent              Pap taken: Yes.   Bimanual Exam:  Uterus:  uterus absent              Adnexa: no mass, fullness, tenderness               Rectovaginal: Confirms               Anus:  normal sphincter tone, no lesions  Chaperone was present for exam.  A:  Well Woman with normal exam H/O TAH?BSO, off HRT H/O DES exposure Family hx of colon cancer Psoriatic arthritis Hypertension  P:   Mammogram guidelines reviewed.  Due in July. pap smear obtained today Colonoscopy is up to date Tdap will be updated today Lab work done with Dr. Regis Bill Return annually or prn

## 2018-07-20 LAB — CYTOLOGY - PAP: Diagnosis: NEGATIVE

## 2018-07-26 ENCOUNTER — Encounter (HOSPITAL_COMMUNITY)
Admission: RE | Admit: 2018-07-26 | Discharge: 2018-07-26 | Disposition: A | Discharge status: Home / Self Care | Payer: Medicare Other | Source: Ambulatory Visit | Attending: Orthopedic Surgery | Admitting: Orthopedic Surgery

## 2018-07-26 ENCOUNTER — Other Ambulatory Visit: Payer: Self-pay

## 2018-07-26 ENCOUNTER — Encounter (HOSPITAL_COMMUNITY): Payer: Self-pay

## 2018-07-26 DIAGNOSIS — M25562 Pain in left knee: Secondary | ICD-10-CM | POA: Diagnosis not present

## 2018-07-26 DIAGNOSIS — Z01818 Encounter for other preprocedural examination: Secondary | ICD-10-CM | POA: Insufficient documentation

## 2018-07-26 DIAGNOSIS — I10 Essential (primary) hypertension: Secondary | ICD-10-CM | POA: Diagnosis not present

## 2018-07-26 HISTORY — DX: Enthesopathy, unspecified: M77.9

## 2018-07-26 LAB — BASIC METABOLIC PANEL
Anion gap: 7 (ref 5–15)
BUN: 17 mg/dL (ref 8–23)
CO2: 26 mmol/L (ref 22–32)
Calcium: 9.6 mg/dL (ref 8.9–10.3)
Chloride: 107 mmol/L (ref 98–111)
Creatinine, Ser: 0.7 mg/dL (ref 0.44–1.00)
GFR calc Af Amer: >60 mL/min (ref >60)
GFR calc non Af Amer: >60 mL/min (ref >60)
Glucose, Bld: 107 mg/dL — ABNORMAL HIGH (ref 70–99)
Potassium: 4.5 mmol/L (ref 3.5–5.1)
Sodium: 140 mmol/L (ref 135–145)

## 2018-07-26 LAB — CBC
HCT: 40.4 % (ref 36.0–46.0)
Hemoglobin: 13.8 g/dL (ref 12.0–15.0)
MCH: 32.7 pg (ref 26.0–34.0)
MCHC: 34.2 g/dL (ref 30.0–36.0)
MCV: 95.7 fL (ref 80.0–100.0)
Platelets: 301 10*3/uL (ref 150–400)
RBC: 4.22 MIL/uL (ref 3.87–5.11)
RDW: 12.7 % (ref 11.5–15.5)
WBC: 5.5 10*3/uL (ref 4.0–10.5)
nRBC: 0 % (ref 0.0–0.2)

## 2018-07-26 NOTE — Patient Instructions (Addendum)
YOU ARE REQUIRED TO BE TESTED FOR COVID-19 PRIOR TO YOUR SURGERY . YOUR TEST MUST BE COMPLETED ON Thursday 5-28, May 2020. TESTING IS LOCATED AT [facility] ENTRANCE FROM 9:00AM - 3:00PM. FAILURE TO COMPLETE TESTING MAY RESULT IN CANCELLATION OF YOUR SURGERY.                    SANYLA SUMMEY     Your procedure is scheduled on: 08-01-2018   Report to [facility] Main  Entrance     Report to admitting at 12:15PM       Call this number if you have problems the morning of surgery [phone]    Remember: Do not eat food After Midnight. YOU MAY HAVE CLEAR LIQUIDS FROM MIDNIGHT UNTIL 4:30AM. At 4:30AM Please finish the prescribed Pre-Surgery ENSURE drink. Nothing by mouth after you finish the ENSURE drink !      CLEAR LIQUID DIET   Foods Allowed                                                                     Foods Excluded  Coffee and tea, regular and decaf                             liquids that you cannot  Plain Jell-O in any flavor                                             see through such as: Fruit ices (not with fruit pulp)                                     milk, soups, orange juice  Iced Popsicles                                    All solid food Carbonated beverages, regular and diet                                    Cranberry, grape and apple juices Sports drinks like Gatorade Lightly seasoned clear broth or consume(fat free) Sugar, honey syrup  Sample Menu Breakfast                                Lunch                                     Supper Cranberry juice                    Beef broth  Chicken broth Jell-O                                     Grape juice                           Apple juice Coffee or tea                        Jell-O                                      Popsicle                                                Coffee or tea                        Coffee or  tea  _____________________________________________________________________    BRUSH YOUR TEETH MORNING OF SURGERY AND RINSE YOUR MOUTH OUT, NO CHEWING GUM CANDY OR MINTS.     Take these medicines the morning of surgery with A SIP OF WATER: TYLENOL, LORATADINE, OMEPRAZOLE, EYE DROPS                                   You may not have any metal on your body including hair pins and              piercings  Do not wear jewelry, make-up, lotions, powders or perfumes, deodorant             Do not wear nail polish.  Do not shave  48 hours prior to surgery.              Men may shave face and neck.    Do not bring valuables to the hospital. [place].  Contacts, dentures or bridgework may not be worn into surgery.                Please read over the following fact sheets you were given: _____________________________________________________________________   [facility] - Preparing for Surgery Before surgery, you can play an important role.  Because skin is not sterile, your skin needs to be as free of germs as possible.  You can reduce the number of germs on your skin by washing with CHG (chlorahexidine gluconate) soap before surgery.  CHG is an antiseptic cleaner which kills germs and bonds with the skin to continue killing germs even after washing. Please DO NOT use if you have an allergy to CHG or antibacterial soaps.  If your skin becomes reddened/irritated stop using the CHG and inform your nurse when you arrive at Short Stay. Do not shave (including legs and underarms) for at least 48 hours prior to the first CHG shower.  You may shave your face/neck. Please follow these instructions carefully:  1.  Shower with CHG Soap the night before surgery and the  morning of Surgery.  2.  If you choose  to wash your hair, wash your hair first as usual with your  normal  shampoo.  3.  After you shampoo, rinse your hair and body thoroughly to remove  the  shampoo.                           4.  Use CHG as you would any other liquid soap.  You can apply chg directly  to the skin and wash                       Gently with a scrungie or clean washcloth.  5.  Apply the CHG Soap to your body ONLY FROM THE NECK DOWN.   Do not use on face/ open                           Wound or open sores. Avoid contact with eyes, ears mouth and genitals (private parts).                       Wash face,  Genitals (private parts) with your normal soap.             6.  Wash thoroughly, paying special attention to the area where your surgery  will be performed.  7.  Thoroughly rinse your body with warm water from the neck down.  8.  DO NOT shower/wash with your normal soap after using and rinsing off  the CHG Soap.                9.  Pat yourself dry with a clean towel.            10.  Wear clean pajamas.            11.  Place clean sheets on your bed the night of your first shower and do not  sleep with pets. Day of Surgery : Do not apply any lotions/deodorants the morning of surgery.  Please wear clean clothes to the hospital/surgery center.  FAILURE TO FOLLOW THESE INSTRUCTIONS MAY RESULT IN THE CANCELLATION OF YOUR SURGERY PATIENT SIGNATURE_________________________________  NURSE SIGNATURE__________________________________  ________________________________________________________________________   Adam Phenix  An incentive spirometer is a tool that can help keep your lungs clear and active. This tool measures how well you are filling your lungs with each breath. Taking long deep breaths may help reverse or decrease the chance of developing breathing (pulmonary) problems (especially infection) following:  A long period of time when you are unable to move or be active. BEFORE THE PROCEDURE   If the spirometer includes an indicator to show your best effort, your nurse or respiratory therapist will set it to a desired goal.  If possible, sit up  straight or lean slightly forward. Try not to slouch.  Hold the incentive spirometer in an upright position. INSTRUCTIONS FOR USE  1. Sit on the edge of your bed if possible, or sit up as far as you can in bed or on a chair. 2. Hold the incentive spirometer in an upright position. 3. Breathe out normally. 4. Place the mouthpiece in your mouth and seal your lips tightly around it. 5. Breathe in slowly and as deeply as possible, raising the piston or the ball toward the top of the column. 6. Hold your breath for 3-5 seconds or for as long as possible. Allow the piston or ball to fall to  the bottom of the column. 7. Remove the mouthpiece from your mouth and breathe out normally. 8. Rest for a few seconds and repeat Steps 1 through 7 at least 10 times every 1-2 hours when you are awake. Take your time and take a few normal breaths between deep breaths. 9. The spirometer may include an indicator to show your best effort. Use the indicator as a goal to work toward during each repetition. 10. After each set of 10 deep breaths, practice coughing to be sure your lungs are clear. If you have an incision (the cut made at the time of surgery), support your incision when coughing by placing a pillow or rolled up towels firmly against it. Once you are able to get out of bed, walk around indoors and cough well. You may stop using the incentive spirometer when instructed by your caregiver.  RISKS AND COMPLICATIONS  Take your time so you do not get dizzy or light-headed.  If you are in pain, you may need to take or ask for pain medication before doing incentive spirometry. It is harder to take a deep breath if you are having pain. AFTER USE  Rest and breathe slowly and easily.  It can be helpful to keep track of a log of your progress. Your caregiver can provide you with a simple table to help with this. If you are using the spirometer at home, follow these instructions: [place] IF:   You are  having difficultly using the spirometer.  You have trouble using the spirometer as often as instructed.  Your pain medication is not giving enough relief while using the spirometer.  You develop fever of 100.5 F (38.1 C) or higher. SEEK IMMEDIATE MEDICAL CARE IF:   You cough up bloody sputum that had not been present before.  You develop fever of 102 F (38.9 C) or greater.  You develop worsening pain at or near the incision site. MAKE SURE YOU:   Understand these instructions.  Will watch your condition.  Will get help right away if you are not doing well or get worse. Document Released: 06/29/2006 Document Revised: 05/11/2011 Document Reviewed: 08/30/2006 [facility] Patient Information 2014 [place], [place].   ________________________________________________________________________

## 2018-07-27 NOTE — Progress Notes (Signed)
Anesthesia Chart Review   Case:  [id number] Date/Time:  08/01/18 1400   Procedure:  Left knee hardware removal (Left ) - 38min   Anesthesia type:  Choice   Pre-op diagnosis:  painful hardware left knee   Location:  [facility] ROOM 09 / [facility]   Surgeon:  Gaynelle Arabian, MD      DISCUSSION: 68 yo never smoker with h/o PONV, RBBB, RA, fibromyalgia, GERD, HLD, HTN, anemia, painful hardware left knee scheduled for above procedure 08/01/18 with Dr. Gaynelle Arabian.   Pt last seen by PCP, Dr. Shanon Ace, 05/04/2018 for preoperative clearance.  Stable at this visit.  Per Dr. Regis Bill, "Acceptable to proceed with surgery."  Pt can proceed with planned procedure barring acute status change.  VS: BP (!) 161/79   Pulse 79   Temp 36.9 C (Oral)   Resp 16   Ht 5\' 4"  (1.626 m)   Wt 98.1 kg   LMP 03/02/1984   SpO2 98%   BMI 37.13 kg/m   PROVIDERS: Panosh, Standley Brooking, MD is PCP    LABS: Labs reviewed: Acceptable for surgery. (all labs ordered are listed, but only abnormal results are displayed)  Labs Reviewed  BASIC METABOLIC PANEL - Abnormal; Notable for the following components:      Result Value   Glucose, Bld 107 (*)    All other components within normal limits  CBC     IMAGES:   EKG: 07/26/2018 Rate 71 bpm Normal sinus rhythm  RBBB  No significant change was found   CV: Echo 04/05/2006 SUMMARY - Overall left ventricular systolic function was normal. Left    ventricular ejection fraction was estimated to be 65 %. There    were no left ventricular regional wall motion abnormalities. - Normal aortic valve - Normal mitral valve Past Medical History:  Diagnosis Date  . Anemia    iron def  . Arthritis    rheumatoid and osteo now flt to be psoriateic   . Arthritis    psoreratic, osteoarthritis  . Bone spur    RIGHT FOOT   . Broken toe   . Bruises easily   . CHOLECYSTECTOMY, HX OF   . Chronic back pain   . COLONIC POLYPS, HX OF 02/09/2007  . Cough, persistent  01/21/2012   poss from acei   . 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 Femur fracture, left ([place])   . Fibromyalgia    doesn't require meds  . GASTRIC ULCER, ACUTE, HEMORRHAGE, HX OF 02/09/2007  . GERD (gastroesophageal reflux disease)    takes Nexium daily  . Goiter   . H/O hiatal hernia   . Headache(784.0)    occasionally  . History of bronchitis   . Hx of seasonal allergies    takes Claritin daily  . HYPERLIPIDEMIA 12/29/2006   takes Lovastatin nightly  . HYPERTENSION 12/29/2006   takes Enalapril daily  . IBS 04/28/2007  . Insomnia    related to pain;takes Flexeril and Tylenol PM nightly  . Joint pain   . Joint swelling   . Palpitations 02/09/2007  . PONV (postoperative nausea and vomiting)    NAUSEA  . REDUCTION MAMMOPLASTY, HX OF 02/09/2007  . Rheumatoid arthritis(714.0) 12/29/2006  . Right bundle branch block 02/09/2007  . S/P lumbar fusion 6 13 09/03/2011   L4 L5  posterior   . SYNCOPE 12/07/2007  . Tendonitis    RLE   . UNS ADVRS EFF OTH RX MEDICINAL\T\BIOLOGICAL SBSTNC 02/09/2007    Past Surgical History:  Procedure Laterality Date  . BACK SURGERY  12+yrs ago   Synovial Cyst removal  . BACK SURGERY  6/13   lumbar L4-L5 fusion, lamenectomy  . bone spur  6-62yrs ago   right ankle  . BUNIONECTOMY  10/03/14   right foot  . CERVICAL FUSION     2 12   . CHOLECYSTECTOMY  1996  . JOINT REPLACEMENT    . KNEE ARTHROPLASTY    . POSTERIOR CERVICAL LAMINECTOMY Left 07/30/2015   Procedure: Laminectomy and Foraminotomy - left - Cervical two -Cervical three;  Surgeon: Earnie Larsson, MD;  Location: [facility];  Service: Neurosurgery;  Laterality: Left;  . REDUCTION MAMMAPLASTY Bilateral 1989  . TONSILLECTOMY     as a child  . TOTAL ABDOMINAL HYSTERECTOMY W/ BILATERAL SALPINGOOPHORECTOMY  1986  . TOTAL KNEE ARTHROPLASTY N/A 06/21/2017   Procedure: LEFT TOTAL KNEE ARTHROPLASTY AND RIGHT KNEE CORTISONE INJECTION;  Surgeon: Gaynelle Arabian, MD;  Location: [facility];  Service:  Orthopedics;  Laterality: N/A;    MEDICATIONS: . acetaminophen (TYLENOL) 650 MG CR tablet  . amLODipine (NORVASC) 5 MG tablet  . amoxicillin (AMOXIL) 500 MG capsule  . B-D ALLERGY SYRINGE 1CC/28G 28G X 1/2" 1 ML MISC  . Calcium Carb-Cholecalciferol (CALCIUM+D3 PO)  . diphenhydramine-acetaminophen (TYLENOL PM) 25-500 MG TABS tablet  . loratadine (CLARITIN) 10 MG tablet  . losartan (COZAAR) 100 MG tablet  . lovastatin (MEVACOR) 20 MG tablet  . methotrexate 50 MG/2ML injection  . Multiple Vitamins-Minerals (MULTIVITAMIN ADULTS PO)  . Olopatadine HCl (PATADAY OP)  . omeprazole (PRILOSEC) 40 MG capsule  . SIMPONI 50 MG/0.5ML SOAJ   No current facility-administered medications for this encounter.     Maia Plan [facility] Pre-Surgical Testing [phone] 07/27/18 10:25 AM

## 2018-07-27 NOTE — Anesthesia Preprocedure Evaluation (Addendum)
Anesthesia Evaluation  Patient identified by MRN, date of birth, ID band Patient awake    Reviewed: Allergy & Precautions, NPO status , Patient's Chart, lab work & pertinent test results  History of Anesthesia Complications (+) PONV  Airway Mallampati: II  TM Distance: >3 FB Neck ROM: Full    Dental no notable dental hx. (+) Teeth Intact   Pulmonary neg pulmonary ROS,    Pulmonary exam normal breath sounds clear to auscultation       Cardiovascular hypertension, Pt. on medications Normal cardiovascular exam Rhythm:Regular Rate:Normal     Neuro/Psych  Headaches, negative psych ROS   GI/Hepatic Neg liver ROS,   Endo/Other    Renal/GU      Musculoskeletal  (+) Fibromyalgia -  Abdominal   Peds  Hematology  (+) anemia ,   Anesthesia Other Findings   Reproductive/Obstetrics                           Anesthesia Physical Anesthesia Plan  ASA: III  Anesthesia Plan: General   Post-op Pain Management:  Regional for Post-op pain   Induction: Intravenous  PONV Risk Score and Plan: 3 and Treatment may vary due to age or medical condition, Ondansetron and Dexamethasone  Airway Management Planned: LMA  Additional Equipment:   Intra-op Plan:   Post-operative Plan:   Informed Consent:     Dental advisory given  Plan Discussed with:   Anesthesia Plan Comments: (See PAT note 07/26/2018, Konrad Felix, PA-C  GA w Adductor Canal  k)      Anesthesia Quick Evaluation

## 2018-07-28 ENCOUNTER — Other Ambulatory Visit (HOSPITAL_COMMUNITY)
Admission: RE | Admit: 2018-07-28 | Discharge: 2018-07-28 | Disposition: A | Discharge status: Home / Self Care | Payer: Medicare Other | Source: Ambulatory Visit | Attending: Orthopedic Surgery | Admitting: Orthopedic Surgery

## 2018-07-28 DIAGNOSIS — M25562 Pain in left knee: Secondary | ICD-10-CM | POA: Diagnosis not present

## 2018-07-28 DIAGNOSIS — Z01818 Encounter for other preprocedural examination: Secondary | ICD-10-CM | POA: Diagnosis not present

## 2018-07-28 DIAGNOSIS — I10 Essential (primary) hypertension: Secondary | ICD-10-CM | POA: Diagnosis not present

## 2018-07-29 ENCOUNTER — Encounter: Payer: Self-pay | Admitting: Obstetrics & Gynecology

## 2018-07-29 LAB — NOVEL CORONAVIRUS, NAA (HOSP ORDER, SEND-OUT TO REF LAB; TAT 18-24 HRS): SARS-CoV-2, NAA: NOT DETECTED

## 2018-07-29 NOTE — Telephone Encounter (Signed)
Message routed to Thayer Ohm. Encounter closed.

## 2018-07-29 NOTE — Progress Notes (Addendum)
SPOKE W/  Patient via phone     SCREENING SYMPTOMS OF COVID 19:   COUGH--no  RUNNY NOSE--- no  SORE THROAT---no  NASAL CONGESTION----no  SNEEZING----no  SHORTNESS OF BREATH---no  DIFFICULTY BREATHING---no  TEMP >100.0 -----no  UNEXPLAINED BODY ACHES------no  CHILLS -------- no  HEADACHES ---------no  LOSS OF SMELL/ TASTE --------no    HAVE YOU OR ANY FAMILY MEMBER TRAVELLED PAST 14 DAYS OUT OF THE   COUNTY---no STATE----no COUNTRY----no  HAVE YOU OR ANY FAMILY MEMBER BEEN EXPOSED TO ANYONE WITH COVID 19? no     

## 2018-08-01 ENCOUNTER — Encounter (HOSPITAL_COMMUNITY): Payer: Self-pay | Admitting: *Deleted

## 2018-08-01 ENCOUNTER — Ambulatory Visit (HOSPITAL_COMMUNITY)
Admission: RE | Admit: 2018-08-01 | Discharge: 2018-08-01 | Disposition: A | Discharge status: Home / Self Care | Payer: Medicare Other | Attending: Orthopedic Surgery | Admitting: Orthopedic Surgery

## 2018-08-01 ENCOUNTER — Ambulatory Visit (HOSPITAL_COMMUNITY): Payer: Medicare Other | Admitting: Anesthesiology

## 2018-08-01 ENCOUNTER — Other Ambulatory Visit: Payer: Self-pay

## 2018-08-01 ENCOUNTER — Ambulatory Visit (HOSPITAL_COMMUNITY): Payer: Medicare Other | Admitting: Physician Assistant

## 2018-08-01 ENCOUNTER — Encounter (HOSPITAL_COMMUNITY)
Admission: RE | Disposition: A | Discharge status: Home / Self Care | Payer: Self-pay | Source: Home / Self Care | Attending: Orthopedic Surgery

## 2018-08-01 ENCOUNTER — Telehealth (HOSPITAL_COMMUNITY): Payer: Self-pay | Admitting: *Deleted

## 2018-08-01 DIAGNOSIS — M069 Rheumatoid arthritis, unspecified: Secondary | ICD-10-CM | POA: Insufficient documentation

## 2018-08-01 DIAGNOSIS — Z79899 Other long term (current) drug therapy: Secondary | ICD-10-CM | POA: Diagnosis not present

## 2018-08-01 DIAGNOSIS — I10 Essential (primary) hypertension: Secondary | ICD-10-CM | POA: Insufficient documentation

## 2018-08-01 DIAGNOSIS — K219 Gastro-esophageal reflux disease without esophagitis: Secondary | ICD-10-CM | POA: Insufficient documentation

## 2018-08-01 DIAGNOSIS — T8484XA Pain due to internal orthopedic prosthetic devices, implants and grafts, initial encounter: Secondary | ICD-10-CM | POA: Diagnosis not present

## 2018-08-01 DIAGNOSIS — G8918 Other acute postprocedural pain: Secondary | ICD-10-CM | POA: Diagnosis not present

## 2018-08-01 DIAGNOSIS — Y831 Surgical operation with implant of artificial internal device as the cause of abnormal reaction of the patient, or of later complication, without mention of misadventure at the time of the procedure: Secondary | ICD-10-CM | POA: Insufficient documentation

## 2018-08-01 DIAGNOSIS — Z96652 Presence of left artificial knee joint: Secondary | ICD-10-CM | POA: Insufficient documentation

## 2018-08-01 DIAGNOSIS — J302 Other seasonal allergic rhinitis: Secondary | ICD-10-CM | POA: Insufficient documentation

## 2018-08-01 DIAGNOSIS — E785 Hyperlipidemia, unspecified: Secondary | ICD-10-CM | POA: Diagnosis not present

## 2018-08-01 HISTORY — PX: HARDWARE REMOVAL: SHX979

## 2018-08-01 SURGERY — REMOVAL, HARDWARE
Anesthesia: General | Site: Knee | Laterality: Left

## 2018-08-01 MED ORDER — PROPOFOL 10 MG/ML IV BOLUS
INTRAVENOUS | Status: AC
  Filled 2018-08-01: qty 20

## 2018-08-01 MED ORDER — METHOCARBAMOL 500 MG PO TABS: 500.0000 mg | ORAL_TABLET | Freq: Four times a day (QID) | ORAL | Status: DC | PRN

## 2018-08-01 MED ORDER — PROPOFOL 10 MG/ML IV BOLUS
INTRAVENOUS | Status: DC | PRN
  Administered 2018-08-01: 120 mg via INTRAVENOUS
  Administered 2018-08-01: 20 mg via INTRAVENOUS

## 2018-08-01 MED ORDER — MIDAZOLAM HCL 2 MG/2ML IJ SOLN
INTRAMUSCULAR | Status: AC
  Filled 2018-08-01: qty 2

## 2018-08-01 MED ORDER — VANCOMYCIN HCL IN DEXTROSE 1-5 GM/200ML-% IV SOLN
1000.0000 mg | INTRAVENOUS | Status: AC
  Administered 2018-08-01: 1000 mg via INTRAVENOUS
  Filled 2018-08-01: qty 200

## 2018-08-01 MED ORDER — BUPIVACAINE-EPINEPHRINE (PF) 0.25% -1:200000 IJ SOLN
INTRAMUSCULAR | Status: AC
  Filled 2018-08-01: qty 30

## 2018-08-01 MED ORDER — ACETAMINOPHEN 10 MG/ML IV SOLN
1000.0000 mg | Freq: Once | INTRAVENOUS | Status: AC
  Administered 2018-08-01: 1000 mg via INTRAVENOUS
  Filled 2018-08-01: qty 100

## 2018-08-01 MED ORDER — HYDROCODONE-ACETAMINOPHEN 5-325 MG PO TABS
ORAL_TABLET | ORAL | Status: AC
  Filled 2018-08-01: qty 1

## 2018-08-01 MED ORDER — HYDROCODONE-ACETAMINOPHEN 5-325 MG PO TABS
1.0000 | ORAL_TABLET | ORAL | Status: DC | PRN
  Administered 2018-08-01: 1 via ORAL

## 2018-08-01 MED ORDER — ONDANSETRON HCL 4 MG/2ML IJ SOLN
INTRAMUSCULAR | Status: DC | PRN
  Administered 2018-08-01: 4 mg via INTRAVENOUS

## 2018-08-01 MED ORDER — MIDAZOLAM HCL 2 MG/2ML IJ SOLN
1.0000 mg | Freq: Once | INTRAMUSCULAR | Status: AC
  Administered 2018-08-01: 2 mg via INTRAVENOUS
  Filled 2018-08-01: qty 2

## 2018-08-01 MED ORDER — DEXAMETHASONE SODIUM PHOSPHATE 10 MG/ML IJ SOLN
INTRAMUSCULAR | Status: DC | PRN
  Administered 2018-08-01: 8 mg via INTRAVENOUS

## 2018-08-01 MED ORDER — 0.9 % SODIUM CHLORIDE (POUR BTL) OPTIME
TOPICAL | Status: DC | PRN
  Administered 2018-08-01: 1000 mL

## 2018-08-01 MED ORDER — FENTANYL CITRATE (PF) 100 MCG/2ML IJ SOLN
INTRAMUSCULAR | Status: AC
  Filled 2018-08-01: qty 2

## 2018-08-01 MED ORDER — LIDOCAINE HCL (CARDIAC) PF 100 MG/5ML IV SOSY
PREFILLED_SYRINGE | INTRAVENOUS | Status: DC | PRN
  Administered 2018-08-01: 100 mg via INTRAVENOUS

## 2018-08-01 MED ORDER — POVIDONE-IODINE 10 % EX SWAB: 2.0000 "application " | Freq: Once | CUTANEOUS | Status: DC

## 2018-08-01 MED ORDER — FENTANYL CITRATE (PF) 100 MCG/2ML IJ SOLN
INTRAMUSCULAR | Status: DC | PRN
  Administered 2018-08-01: 25 ug via INTRAVENOUS
  Administered 2018-08-01: 50 ug via INTRAVENOUS
  Administered 2018-08-01: 25 ug via INTRAVENOUS

## 2018-08-01 MED ORDER — ONDANSETRON HCL 4 MG/2ML IJ SOLN
INTRAMUSCULAR | Status: AC
  Filled 2018-08-01: qty 2

## 2018-08-01 MED ORDER — LIDOCAINE 2% (20 MG/ML) 5 ML SYRINGE
INTRAMUSCULAR | Status: AC
  Filled 2018-08-01: qty 5

## 2018-08-01 MED ORDER — CLONIDINE HCL (ANALGESIA) 100 MCG/ML EP SOLN
EPIDURAL | Status: DC | PRN
  Administered 2018-08-01: 100 ug

## 2018-08-01 MED ORDER — LACTATED RINGERS IV SOLN
INTRAVENOUS | Status: DC
  Administered 2018-08-01 (×2): via INTRAVENOUS

## 2018-08-01 MED ORDER — CHLORHEXIDINE GLUCONATE 4 % EX LIQD: 60.0000 mL | Freq: Once | CUTANEOUS | Status: DC

## 2018-08-01 MED ORDER — ROPIVACAINE HCL 5 MG/ML IJ SOLN
INTRAMUSCULAR | Status: DC | PRN
  Administered 2018-08-01: 30 mL via PERINEURAL

## 2018-08-01 MED ORDER — FENTANYL CITRATE (PF) 100 MCG/2ML IJ SOLN
50.0000 ug | Freq: Once | INTRAMUSCULAR | Status: AC
  Administered 2018-08-01: 100 ug via INTRAVENOUS
  Filled 2018-08-01: qty 2

## 2018-08-01 MED ORDER — BUPIVACAINE-EPINEPHRINE (PF) 0.25% -1:200000 IJ SOLN
INTRAMUSCULAR | Status: DC | PRN
  Administered 2018-08-01: 30 mL via PERINEURAL

## 2018-08-01 MED ORDER — DEXAMETHASONE SODIUM PHOSPHATE 10 MG/ML IJ SOLN
INTRAMUSCULAR | Status: AC
  Filled 2018-08-01: qty 1

## 2018-08-01 SURGICAL SUPPLY — 35 items
BANDAGE ACE 6X5 VEL STRL LF (GAUZE/BANDAGES/DRESSINGS) ×2 IMPLANT
BANDAGE ESMARK 6X9 LF (GAUZE/BANDAGES/DRESSINGS) ×1 IMPLANT
BNDG ESMARK 6X9 LF (GAUZE/BANDAGES/DRESSINGS) ×2
COVER SURGICAL LIGHT HANDLE (MISCELLANEOUS) ×2 IMPLANT
COVER WAND RF STERILE (DRAPES) IMPLANT
DRAPE INCISE IOBAN 66X45 STRL (DRAPES) ×2 IMPLANT
DRAPE U-SHAPE 47X51 STRL (DRAPES) ×2 IMPLANT
DRSG ADAPTIC 3X8 NADH LF (GAUZE/BANDAGES/DRESSINGS) ×2 IMPLANT
DRSG PAD ABDOMINAL 8X10 ST (GAUZE/BANDAGES/DRESSINGS) ×2 IMPLANT
DURAPREP 26ML APPLICATOR (WOUND CARE) ×2 IMPLANT
ELECT REM PT RETURN 15FT ADLT (MISCELLANEOUS) ×2 IMPLANT
GAUZE SPONGE 4X4 12PLY STRL (GAUZE/BANDAGES/DRESSINGS) ×2 IMPLANT
GLOVE BIO SURGEON STRL SZ7.5 (GLOVE) ×2 IMPLANT
GLOVE BIO SURGEON STRL SZ8 (GLOVE) ×4 IMPLANT
GLOVE BIOGEL PI IND STRL 7.0 (GLOVE) ×1 IMPLANT
GLOVE BIOGEL PI IND STRL 8 (GLOVE) ×2 IMPLANT
GLOVE BIOGEL PI INDICATOR 7.0 (GLOVE) ×1
GLOVE BIOGEL PI INDICATOR 8 (GLOVE) ×2
GLOVE SURG SS PI 7.0 STRL IVOR (GLOVE) ×2 IMPLANT
GOWN STRL REUS W/TWL LRG LVL3 (GOWN DISPOSABLE) ×6 IMPLANT
KIT BASIN OR (CUSTOM PROCEDURE TRAY) ×2 IMPLANT
KIT TURNOVER KIT A (KITS) IMPLANT
MANIFOLD NEPTUNE II (INSTRUMENTS) ×2 IMPLANT
NS IRRIG 1000ML POUR BTL (IV SOLUTION) ×2 IMPLANT
PACK TOTAL KNEE CUSTOM (KITS) ×2 IMPLANT
PADDING CAST COTTON 6X4 STRL (CAST SUPPLIES) ×2 IMPLANT
PROTECTOR NERVE ULNAR (MISCELLANEOUS) ×2 IMPLANT
STRIP CLOSURE SKIN 1/2X4 (GAUZE/BANDAGES/DRESSINGS) ×4 IMPLANT
SUT MNCRL AB 4-0 PS2 18 (SUTURE) ×2 IMPLANT
SUT VIC AB 0 CT1 36 (SUTURE) ×4 IMPLANT
SUT VIC AB 2-0 CT1 27 (SUTURE) ×3
SUT VIC AB 2-0 CT1 TAPERPNT 27 (SUTURE) ×3 IMPLANT
UNDERPAD 30X30 (UNDERPADS AND DIAPERS) ×2 IMPLANT
WATER STERILE IRR 1000ML POUR (IV SOLUTION) ×2 IMPLANT
WRAP KNEE MAXI GEL POST OP (GAUZE/BANDAGES/DRESSINGS) ×2 IMPLANT

## 2018-08-01 NOTE — Brief Op Note (Signed)
08/01/2018  3:03 PM  PATIENT:  Jade Cardenas  68 y.o. female  PRE-OPERATIVE DIAGNOSIS:  painful hardware left knee  POST-OPERATIVE DIAGNOSIS:  painful hardware left knee  PROCEDURE:  Procedure(s) with comments: Left knee hardware removal (Left) - 45min  SURGEON:  Surgeon(s) and Role:    Gaynelle Arabian, MD - Primary  PHYSICIAN ASSISTANT:   ASSISTANTS: Theresa Duty, PA-C   ANESTHESIA:   general and adductor canal block  EBL:  5 ml   BLOOD ADMINISTERED:none  DRAINS: none   LOCAL MEDICATIONS USED:  MARCAINE     COUNTS:  YES  TOURNIQUET:   Total Tourniquet Time Documented: Thigh (Left) - 27 minutes Total: Thigh (Left) - 27 minutes   DICTATION: .Other Dictation: Dictation Number [id number]  PLAN OF CARE: Discharge to home after PACU  PATIENT DISPOSITION:  PACU - hemodynamically stable.

## 2018-08-01 NOTE — H&P (Signed)
CC- Jade Cardenas is a 68 y.o. female who presents with left knee pain.  HPI- . Knee Pain: Patient presents with knee pain involving the  left knee. Onset of the symptoms was approximately one year ago. Inciting event: She had a total knee arthroplasty last year with an intraoperative femoral condyle fracture treated with a screw. She has healed the fracture bu has pain related to the screw. She presents today for hardware removal.. Current symptoms include pain located medially.   Past Medical History:  Diagnosis Date  . Anemia    iron def  . Arthritis    rheumatoid and osteo now flt to be psoriateic   . Arthritis    psoreratic, osteoarthritis  . Bone spur    RIGHT FOOT   . Broken toe   . Bruises easily   . CHOLECYSTECTOMY, HX OF   . Chronic back pain   . COLONIC POLYPS, HX OF 02/09/2007  . Cough, persistent 01/21/2012   poss from acei   . 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 Femur fracture, left ([place])   . Fibromyalgia    doesn't require meds  . GASTRIC ULCER, ACUTE, HEMORRHAGE, HX OF 02/09/2007  . GERD (gastroesophageal reflux disease)    takes Nexium daily  . Goiter   . H/O hiatal hernia   . Headache(784.0)    occasionally  . History of bronchitis   . Hx of seasonal allergies    takes Claritin daily  . HYPERLIPIDEMIA 12/29/2006   takes Lovastatin nightly  . HYPERTENSION 12/29/2006   takes Enalapril daily  . IBS 04/28/2007  . Insomnia    related to pain;takes Flexeril and Tylenol PM nightly  . Joint pain   . Joint swelling   . Palpitations 02/09/2007  . PONV (postoperative nausea and vomiting)    NAUSEA  . REDUCTION MAMMOPLASTY, HX OF 02/09/2007  . Rheumatoid arthritis(714.0) 12/29/2006  . Right bundle branch block 02/09/2007  . S/P lumbar fusion 6 13 09/03/2011   L4 L5  posterior   . SYNCOPE 12/07/2007  . Tendonitis    RLE   . UNS ADVRS EFF OTH RX MEDICINAL\T\BIOLOGICAL SBSTNC 02/09/2007    Past Surgical History:  Procedure Laterality Date  . BACK SURGERY   12+yrs ago   Synovial Cyst removal  . BACK SURGERY  6/13   lumbar L4-L5 fusion, lamenectomy  . bone spur  6-51yrs ago   right ankle  . BUNIONECTOMY  10/03/14   right foot  . CERVICAL FUSION     2 12   . CHOLECYSTECTOMY  1996  . JOINT REPLACEMENT    . KNEE ARTHROPLASTY    . POSTERIOR CERVICAL LAMINECTOMY Left 07/30/2015   Procedure: Laminectomy and Foraminotomy - left - Cervical two -Cervical three;  Surgeon: Earnie Larsson, MD;  Location: [facility];  Service: Neurosurgery;  Laterality: Left;  . REDUCTION MAMMAPLASTY Bilateral 1989  . TONSILLECTOMY     as a child  . TOTAL ABDOMINAL HYSTERECTOMY W/ BILATERAL SALPINGOOPHORECTOMY  1986  . TOTAL KNEE ARTHROPLASTY N/A 06/21/2017   Procedure: LEFT TOTAL KNEE ARTHROPLASTY AND RIGHT KNEE CORTISONE INJECTION;  Surgeon: Gaynelle Arabian, MD;  Location: [facility];  Service: Orthopedics;  Laterality: N/A;    Prior to Admission medications   Medication Sig Start Date End Date Taking? Authorizing Provider  acetaminophen (TYLENOL) 650 MG CR tablet Take 1,300 mg by mouth See admin instructions. Take 1300 mg in the morning and may take an additional 1300 mg dose as needed for pain  Yes [provider]  amLODipine (NORVASC) 5 MG tablet Take 1 tablet (5 mg total) by mouth daily. Patient taking differently: Take 5 mg by mouth every evening.  05/04/18  Yes Panosh, Standley Brooking, MD  amoxicillin (AMOXIL) 500 MG capsule Take 2,000 mg by mouth See admin instructions. Take 2000 mg 1 hour prior to dental work 05/20/18  Yes [provider]  Calcium Carb-Cholecalciferol (CALCIUM+D3 PO) Take 1 tablet by mouth daily.   Yes [provider]  diphenhydramine-acetaminophen (TYLENOL PM) 25-500 MG TABS tablet Take 2 tablets by mouth at bedtime.   Yes [provider]  loratadine (CLARITIN) 10 MG tablet Take 10 mg by mouth daily as needed for allergies.   Yes [provider]  losartan (COZAAR) 100 MG tablet Take 1 tablet (100 mg total) by mouth  daily. 05/04/18  Yes Panosh, Standley Brooking, MD  lovastatin (MEVACOR) 20 MG tablet Take 1 tablet (20 mg total) by mouth every evening. 05/04/18  Yes Panosh, Standley Brooking, MD  methotrexate 50 MG/2ML injection INJECT 0.3 ML INTO THE SKIN ONCE A WEEK. Patient taking differently: Inject 12.5 mg into the skin every Friday.  06/21/18  Yes Deveshwar, Abel Presto, MD  Multiple Vitamins-Minerals (MULTIVITAMIN ADULTS PO) Take 1 tablet by mouth daily.    Yes [provider]  Olopatadine HCl (PATADAY OP) Place 1 drop into both eyes daily as needed (allergies).   Yes [provider]  omeprazole (PRILOSEC) 40 MG capsule Take 1 capsule (40 mg total) by mouth daily. 05/04/18  Yes Panosh, Standley Brooking, MD  SIMPONI 50 MG/0.5ML SOAJ INJECT 50MG  (1 PEN) UNDER THE SKIN ONCE MONTHLY Patient taking differently: Inject 50 mg as directed every 30 (thirty) days.  05/03/18  Yes Deveshwar, Abel Presto, MD  B-D ALLERGY SYRINGE 1CC/28G 28G X 1/2" 1 ML MISC USE AS DIRECTED ONCE WEEKLY 11/29/17   Bo Merino, MD   KNEE EXAM antalgic gait, soft tissue tenderness over medial knee, no warmth or effusion, collateral ligaments intact  Physical Examination: General appearance - alert, well appearing, and in no distress Mental status - alert, oriented to person, place, and time Chest - clear to auscultation, no wheezes, rales or rhonchi, symmetric air entry Heart - normal rate, regular rhythm, normal S1, S2, no murmurs, rubs, clicks or gallops Abdomen - soft, nontender, nondistended, no masses or organomegaly Neurological - alert, oriented, normal speech, no focal findings or movement disorder noted   Asessment/Plan--- Left knee painful hardware- - Plan left knee hardware removal. Procedure risks and potential comps discussed with patient who elects to proceed. Goals are decreased pain and increased function with a high likelihood of achieving both

## 2018-08-01 NOTE — Transfer of Care (Signed)
Immediate Anesthesia Transfer of Care Note  Patient: Jade Cardenas  Procedure(s) Performed: Left knee hardware removal (Left Knee)  Patient Location: PACU  Anesthesia Type:General  Level of Consciousness: drowsy, patient cooperative and responds to stimulation  Airway & Oxygen Therapy: Patient Spontanous Breathing and Patient connected to face mask oxygen  Post-op Assessment: Report given to RN and Post -op Vital signs reviewed and stable  Post vital signs: Reviewed and stable  Last Vitals:  Vitals Value Taken Time  BP 148/59 08/01/2018  3:26 PM  Temp    Pulse 67 08/01/2018  3:27 PM  Resp 13 08/01/2018  3:27 PM  SpO2 100 % 08/01/2018  3:27 PM  Vitals shown include unvalidated device data.  Last Pain:  Vitals:   08/01/18 1237  TempSrc: Oral  PainSc:       Patients Stated Pain Goal: 4 (11/46/43 1427)  Complications: No apparent anesthesia complications

## 2018-08-01 NOTE — Discharge Instructions (Signed)
You may remove your large bandage and shower in 2 days  Take an 81 mg Aspirin daily for the next 4 weeks starting tomorrow  You may resume activities as tolerated   General Anesthesia, Adult, Care After This sheet gives you information about how to care for yourself after your procedure. Your health care provider may also give you more specific instructions. If you have problems or questions, contact your health care provider. What can I expect after the procedure? After the procedure, the following side effects are common:  Pain or discomfort at the IV site.  Nausea.  Vomiting.  Sore throat.  Trouble concentrating.  Feeling cold or chills.  Weak or tired.  Sleepiness and fatigue.  Soreness and body aches. These side effects can affect parts of the body that were not involved in surgery. Follow these instructions at home:  For at least 24 hours after the procedure:  Have a responsible adult stay with you. It is important to have someone help care for you until you are awake and alert.  Rest as needed.  Do not: ? Participate in activities in which you could fall or become injured. ? Drive. ? Use heavy machinery. ? Drink alcohol. ? Take sleeping pills or medicines that cause drowsiness. ? Make important decisions or sign legal documents. ? Take care of children on your own. Eating and drinking  Follow any instructions from your health care provider about eating or drinking restrictions.  When you feel hungry, start by eating small amounts of foods that are soft and easy to digest (bland), such as toast. Gradually return to your regular diet.  Drink enough fluid to keep your urine pale yellow.  If you vomit, rehydrate by drinking water, juice, or clear broth. General instructions  If you have sleep apnea, surgery and certain medicines can increase your risk for breathing problems. Follow instructions from your health care provider about wearing your sleep  device: ? Anytime you are sleeping, including during daytime naps. ? While taking prescription pain medicines, sleeping medicines, or medicines that make you drowsy.  Return to your normal activities as told by your health care provider. Ask your health care provider what activities are safe for you.  Take over-the-counter and prescription medicines only as told by your health care provider.  If you smoke, do not smoke without supervision.  Keep all follow-up visits as told by your health care provider. This is important. Contact a health care provider if:  You have nausea or vomiting that does not get better with medicine.  You cannot eat or drink without vomiting.  You have pain that does not get better with medicine.  You are unable to pass urine.  You develop a skin rash.  You have a fever.  You have redness around your IV site that gets worse. Get help right away if:  You have difficulty breathing.  You have chest pain.  You have blood in your urine or stool, or you vomit blood. Summary  After the procedure, it is common to have a sore throat or nausea. It is also common to feel tired.  Have a responsible adult stay with you for the first 24 hours after general anesthesia. It is important to have someone help care for you until you are awake and alert.  When you feel hungry, start by eating small amounts of foods that are soft and easy to digest (bland), such as toast. Gradually return to your regular diet.  Drink enough fluid  to keep your urine pale yellow.  Return to your normal activities as told by your health care provider. Ask your health care provider what activities are safe for you. This information is not intended to replace advice given to you by your health care provider. Make sure you discuss any questions you have with your health care provider. Document Released: 05/25/2000 Document Revised: 10/02/2016 Document Reviewed: 10/02/2016 Elsevier  Interactive Patient Education  2019 [employer].

## 2018-08-01 NOTE — Anesthesia Procedure Notes (Signed)
Anesthesia Regional Block: Adductor canal block   Pre-Anesthetic Checklist: ,, timeout performed, Correct Patient, Correct Site, Correct Laterality, Correct Procedure, Correct Position, site marked, Risks and benefits discussed,  Surgical consent,  Pre-op evaluation,  At surgeon's request and post-op pain management  Laterality: Lower and Left  Prep: chloraprep       Needles:  Injection technique: Single-shot  Needle Type: Echogenic Needle     Needle Length: 9cm  Needle Gauge: 22     Additional Needles:   Procedures:,,,, ultrasound used (permanent image in chart),,,,  Narrative:  Start time: 08/01/2018 12:35 PM End time: 08/01/2018 12:40 PM Injection made incrementally with aspirations every 5 mL.  Performed by: Personally  Anesthesiologist: Barnet Glasgow, MD  Additional Notes: Block assessed prior to surgery. Pt tolerated procedure well.

## 2018-08-01 NOTE — Anesthesia Procedure Notes (Signed)
Procedure Name: LMA Insertion Date/Time: 08/01/2018 2:12 PM Performed by: Glory Buff, CRNA Pre-anesthesia Checklist: Patient identified, Emergency Drugs available, Suction available and Patient being monitored Patient Re-evaluated:Patient Re-evaluated prior to induction Oxygen Delivery Method: Circle system utilized Preoxygenation: Pre-oxygenation with 100% oxygen Induction Type: IV induction LMA: LMA inserted LMA Size: 4.0 Number of attempts: 2 Placement Confirmation: positive ETCO2 Tube secured with: Tape Dental Injury: Teeth and Oropharynx as per pre-operative assessment

## 2018-08-01 NOTE — Progress Notes (Signed)
AssistedDr. Houser with left, ultrasound guided, adductor canal block. Side rails up, monitors on throughout procedure. See vital signs in flow sheet. Tolerated Procedure well.  

## 2018-08-01 NOTE — Anesthesia Postprocedure Evaluation (Signed)
Anesthesia Post Note  Patient: Jade Cardenas  Procedure(s) Performed: Left knee hardware removal (Left Knee)     Patient location during evaluation: PACU Anesthesia Type: General Level of consciousness: awake and alert Pain management: pain level controlled Vital Signs Assessment: post-procedure vital signs reviewed and stable Respiratory status: spontaneous breathing, nonlabored ventilation, respiratory function stable and patient connected to nasal cannula oxygen Cardiovascular status: blood pressure returned to baseline and stable Postop Assessment: no apparent nausea or vomiting Anesthetic complications: no    Last Vitals:  Vitals:   08/01/18 1545 08/01/18 1600  BP: 139/75 134/77  Pulse: 66 71  Resp: 20 17  Temp: 36.5 C (!) 36.4 C  SpO2: 100% 100%    Last Pain:  Vitals:   08/01/18 1615  TempSrc:   PainSc: 7                  Barnet Glasgow

## 2018-08-01 NOTE — Op Note (Signed)
Jade Cardenas, Jade Cardenas MEDICAL RECORD [id number] ACCOUNT [id number] DATE OF BIRTH:[birth year] FACILITY: [facility] LOCATION: [facility] PHYSICIAN:Trew Sunde Zella Ball, MD  OPERATIVE REPORT  DATE OF PROCEDURE:  08/01/2018  PREOPERATIVE DIAGNOSIS:  Painful hardware, left knee.  POSTOPERATIVE DIAGNOSIS:  Painful hardware, left knee.  PROCEDURE:  Left knee hardware removal.  SURGEON:  Gaynelle Arabian, MD  ASSISTANT:  Theresa Duty, PA-C   ANESTHESIA:  General with adductor canal block.  ESTIMATED BLOOD LOSS:  Minimal.  DRAINS:  None.  TOURNIQUET TIME:  27 minutes at 300 mmHg.  COMPLICATIONS:  None.  CONDITION:  Stable to recovery.  BRIEF CLINICAL NOTE:  The patient is a 68 year old female who had a left total knee arthroplasty done last year, complicated with intraoperative femoral condyle fracture.  Treated with fixation with Steinmann pins and a cannulated screw.  She has had  pain related to the hardware.  She presents now for hardware removal.  PROCEDURE IN DETAIL:  After successful administration of adductor canal block and general anesthetic, a tourniquet was placed high on the left thigh.  Left lower extremity prepped and draped in the usual sterile fashion.  Extremity was wrapped in  Esmarch.  Tourniquet inflated to 300 mmHg.  Midline incision was made with a 10 blade through subcutaneous tissue to the level of the extensor mechanism.  A fresh blade was used to make a medial arthrotomy.  This was about a 2 cm arthrotomy incision.   The hardware was all medial.  There was some bony overgrowth.  I found the 2 Steinmann pins first, and we removed those without difficulty.  Those probably were the culprits in her pain as they were protruding through the bone and irritating the soft  tissue.  I then found the head of the screw, which did have some bony overgrowth.  I was able to remove the screw, but the washer was completely covered with bone.  I did not want to disrupt that.  Once  the hardware was removed, the wounds were copiously  irrigated with saline solution.  There was one other pin that was on the x-ray but deeply imbedded within the bone.  We did not see that pin.  Thus, it was left intact.  The wound was irrigated with saline solution.  Arthrotomy was closed with  interrupted 0 Vicryl.  Tourniquet was released.  Total time 27 minutes.  I flexed the knee up and the arthrotomy remain closed.  Minor bleeding stopped with cautery.  Subcu was closed with interrupted 2-0 Vicryl and subcuticular running 4-0 Monocryl.  Incisions cleaned and dried and Steri-Strips and a  bulky sterile dressing were applied.  She was then awakened and transported to recovery in stable condition.  LN/NUANCE  D:08/01/2018 T:08/01/2018 JOB:[id number]

## 2018-08-02 ENCOUNTER — Encounter (HOSPITAL_COMMUNITY): Payer: Self-pay | Admitting: Orthopedic Surgery

## 2018-08-05 ENCOUNTER — Ambulatory Visit: Payer: Medicare Other | Admitting: Podiatry

## 2018-08-06 ENCOUNTER — Other Ambulatory Visit: Payer: Self-pay | Admitting: Rheumatology

## 2018-08-08 ENCOUNTER — Encounter: Payer: Self-pay | Admitting: Rheumatology

## 2018-08-08 NOTE — Telephone Encounter (Signed)
Last Visit: 07/14/18 Next Visit: 12/14/18 Labs: 07/26/18 cbc/bmp WNL TB Gold: 11/11/17 Neg   Okay to refill per Dr. Estanislado Pandy

## 2018-08-16 ENCOUNTER — Other Ambulatory Visit: Payer: Self-pay | Admitting: Internal Medicine

## 2018-08-18 ENCOUNTER — Ambulatory Visit: Payer: Medicare Other | Admitting: Podiatry

## 2018-08-29 ENCOUNTER — Other Ambulatory Visit: Payer: Self-pay | Admitting: Rheumatology

## 2018-08-29 NOTE — Telephone Encounter (Signed)
Last Visit: 07/14/18 Next Visit: 12/14/18 Labs: 07/26/18 cbc/bmp WNL TB Gold: 11/11/17 Neg   Okay to refill per Dr. Estanislado Pandy

## 2018-09-08 ENCOUNTER — Other Ambulatory Visit: Payer: Self-pay | Admitting: Rheumatology

## 2018-09-08 NOTE — Telephone Encounter (Signed)
Last Visit: 07/14/18 Next Visit: 12/14/18 Labs: 07/26/18 cbc/bmp WNL  Okay to refill per Dr. Estanislado Pandy

## 2018-09-14 DIAGNOSIS — M722 Plantar fascial fibromatosis: Secondary | ICD-10-CM | POA: Diagnosis not present

## 2018-09-14 DIAGNOSIS — M25562 Pain in left knee: Secondary | ICD-10-CM | POA: Diagnosis not present

## 2018-09-14 DIAGNOSIS — M25561 Pain in right knee: Secondary | ICD-10-CM | POA: Diagnosis not present

## 2018-09-16 ENCOUNTER — Other Ambulatory Visit: Payer: Self-pay | Admitting: Internal Medicine

## 2018-09-16 DIAGNOSIS — M722 Plantar fascial fibromatosis: Secondary | ICD-10-CM | POA: Diagnosis not present

## 2018-09-16 DIAGNOSIS — M25562 Pain in left knee: Secondary | ICD-10-CM | POA: Diagnosis not present

## 2018-09-16 DIAGNOSIS — Z1231 Encounter for screening mammogram for malignant neoplasm of breast: Secondary | ICD-10-CM

## 2018-09-16 DIAGNOSIS — M25561 Pain in right knee: Secondary | ICD-10-CM | POA: Diagnosis not present

## 2018-09-19 DIAGNOSIS — M25562 Pain in left knee: Secondary | ICD-10-CM | POA: Diagnosis not present

## 2018-09-19 DIAGNOSIS — M25561 Pain in right knee: Secondary | ICD-10-CM | POA: Diagnosis not present

## 2018-09-19 DIAGNOSIS — M722 Plantar fascial fibromatosis: Secondary | ICD-10-CM | POA: Diagnosis not present

## 2018-09-22 DIAGNOSIS — M25561 Pain in right knee: Secondary | ICD-10-CM | POA: Diagnosis not present

## 2018-09-22 DIAGNOSIS — M25562 Pain in left knee: Secondary | ICD-10-CM | POA: Diagnosis not present

## 2018-09-22 DIAGNOSIS — M722 Plantar fascial fibromatosis: Secondary | ICD-10-CM | POA: Diagnosis not present

## 2018-09-26 DIAGNOSIS — M25561 Pain in right knee: Secondary | ICD-10-CM | POA: Diagnosis not present

## 2018-09-26 DIAGNOSIS — M722 Plantar fascial fibromatosis: Secondary | ICD-10-CM | POA: Diagnosis not present

## 2018-09-26 DIAGNOSIS — M25562 Pain in left knee: Secondary | ICD-10-CM | POA: Diagnosis not present

## 2018-09-28 DIAGNOSIS — M25562 Pain in left knee: Secondary | ICD-10-CM | POA: Diagnosis not present

## 2018-09-28 DIAGNOSIS — M722 Plantar fascial fibromatosis: Secondary | ICD-10-CM | POA: Diagnosis not present

## 2018-09-28 DIAGNOSIS — M25561 Pain in right knee: Secondary | ICD-10-CM | POA: Diagnosis not present

## 2018-10-03 DIAGNOSIS — M25562 Pain in left knee: Secondary | ICD-10-CM | POA: Diagnosis not present

## 2018-10-03 DIAGNOSIS — M25561 Pain in right knee: Secondary | ICD-10-CM | POA: Diagnosis not present

## 2018-10-03 DIAGNOSIS — M722 Plantar fascial fibromatosis: Secondary | ICD-10-CM | POA: Diagnosis not present

## 2018-10-05 DIAGNOSIS — M722 Plantar fascial fibromatosis: Secondary | ICD-10-CM | POA: Diagnosis not present

## 2018-10-05 DIAGNOSIS — M25561 Pain in right knee: Secondary | ICD-10-CM | POA: Diagnosis not present

## 2018-10-05 DIAGNOSIS — M25562 Pain in left knee: Secondary | ICD-10-CM | POA: Diagnosis not present

## 2018-10-11 DIAGNOSIS — M25561 Pain in right knee: Secondary | ICD-10-CM | POA: Diagnosis not present

## 2018-10-11 DIAGNOSIS — M25562 Pain in left knee: Secondary | ICD-10-CM | POA: Diagnosis not present

## 2018-10-11 DIAGNOSIS — M722 Plantar fascial fibromatosis: Secondary | ICD-10-CM | POA: Diagnosis not present

## 2018-10-13 DIAGNOSIS — M25562 Pain in left knee: Secondary | ICD-10-CM | POA: Diagnosis not present

## 2018-10-13 DIAGNOSIS — M25561 Pain in right knee: Secondary | ICD-10-CM | POA: Diagnosis not present

## 2018-10-13 DIAGNOSIS — M722 Plantar fascial fibromatosis: Secondary | ICD-10-CM | POA: Diagnosis not present

## 2018-10-18 DIAGNOSIS — M25561 Pain in right knee: Secondary | ICD-10-CM | POA: Diagnosis not present

## 2018-10-18 DIAGNOSIS — M25562 Pain in left knee: Secondary | ICD-10-CM | POA: Diagnosis not present

## 2018-10-18 DIAGNOSIS — M722 Plantar fascial fibromatosis: Secondary | ICD-10-CM | POA: Diagnosis not present

## 2018-10-20 DIAGNOSIS — M722 Plantar fascial fibromatosis: Secondary | ICD-10-CM | POA: Diagnosis not present

## 2018-10-20 DIAGNOSIS — M25562 Pain in left knee: Secondary | ICD-10-CM | POA: Diagnosis not present

## 2018-10-20 DIAGNOSIS — M25561 Pain in right knee: Secondary | ICD-10-CM | POA: Diagnosis not present

## 2018-10-25 DIAGNOSIS — M25561 Pain in right knee: Secondary | ICD-10-CM | POA: Diagnosis not present

## 2018-10-25 DIAGNOSIS — M722 Plantar fascial fibromatosis: Secondary | ICD-10-CM | POA: Diagnosis not present

## 2018-10-25 DIAGNOSIS — M25562 Pain in left knee: Secondary | ICD-10-CM | POA: Diagnosis not present

## 2018-10-27 DIAGNOSIS — M722 Plantar fascial fibromatosis: Secondary | ICD-10-CM | POA: Diagnosis not present

## 2018-10-27 DIAGNOSIS — M25562 Pain in left knee: Secondary | ICD-10-CM | POA: Diagnosis not present

## 2018-10-27 DIAGNOSIS — M25561 Pain in right knee: Secondary | ICD-10-CM | POA: Diagnosis not present

## 2018-10-31 DIAGNOSIS — M25561 Pain in right knee: Secondary | ICD-10-CM | POA: Diagnosis not present

## 2018-10-31 DIAGNOSIS — M25562 Pain in left knee: Secondary | ICD-10-CM | POA: Diagnosis not present

## 2018-10-31 DIAGNOSIS — M722 Plantar fascial fibromatosis: Secondary | ICD-10-CM | POA: Diagnosis not present

## 2018-11-01 ENCOUNTER — Other Ambulatory Visit: Payer: Self-pay

## 2018-11-01 ENCOUNTER — Ambulatory Visit
Admission: RE | Admit: 2018-11-01 | Discharge: 2018-11-01 | Disposition: A | Discharge status: Home / Self Care | Payer: Medicare Other | Source: Ambulatory Visit | Attending: Internal Medicine | Admitting: Internal Medicine

## 2018-11-01 DIAGNOSIS — Z1231 Encounter for screening mammogram for malignant neoplasm of breast: Secondary | ICD-10-CM | POA: Diagnosis not present

## 2018-11-02 DIAGNOSIS — M722 Plantar fascial fibromatosis: Secondary | ICD-10-CM | POA: Diagnosis not present

## 2018-11-02 DIAGNOSIS — M25561 Pain in right knee: Secondary | ICD-10-CM | POA: Diagnosis not present

## 2018-11-02 DIAGNOSIS — M25562 Pain in left knee: Secondary | ICD-10-CM | POA: Diagnosis not present

## 2018-11-03 NOTE — Progress Notes (Signed)
Chief Complaint  Patient presents with  . Annual Exam    Pt wants her ears looked at, Pt wants to be tested for covid antibodies   . Medication Management    HPI: Jade Cardenas 68 y.o. come in for Chronic disease management  Yearly visit   Had cellulitis    Fever after surgery . And finally better   Walks cane  Had a bug in the ear  Check ear  Had fever post op better please check for covid aby Seeing rheum due for lab monitoring  HLD  meds BP doing better no obv SE  Med monitoring needs tb eval iron lelvels  ROS: See pertinent positives and negatives per HPI. Had gyne check  DES baby   Past Medical History:  Diagnosis Date  . Anemia    iron def  . Arthritis    rheumatoid and osteo now flt to be psoriateic   . Arthritis    psoreratic, osteoarthritis  . Bone spur    RIGHT FOOT   . Broken toe   . Bruises easily   . CHOLECYSTECTOMY, HX OF   . Chronic back pain   . COLONIC POLYPS, HX OF 02/09/2007  . Cough, persistent 01/21/2012   poss from acei   . 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 Femur fracture, left ([place])   . Fibromyalgia    doesn't require meds  . GASTRIC ULCER, ACUTE, HEMORRHAGE, HX OF 02/09/2007  . GERD (gastroesophageal reflux disease)    takes Nexium daily  . Goiter   . H/O hiatal hernia   . Headache(784.0)    occasionally  . History of bronchitis   . Hx of seasonal allergies    takes Claritin daily  . HYPERLIPIDEMIA 12/29/2006   takes Lovastatin nightly  . HYPERTENSION 12/29/2006   takes Enalapril daily  . IBS 04/28/2007  . Insomnia    related to pain;takes Flexeril and Tylenol PM nightly  . Joint pain   . Joint swelling   . Palpitations 02/09/2007  . PONV (postoperative nausea and vomiting)    NAUSEA  . REDUCTION MAMMOPLASTY, HX OF 02/09/2007  . Rheumatoid arthritis(714.0) 12/29/2006  . Right bundle branch block 02/09/2007  . S/P lumbar fusion 6 13 09/03/2011   L4 L5  posterior   . SYNCOPE 12/07/2007  . Tendonitis    RLE   . UNS ADVRS  EFF OTH RX MEDICINAL\T\BIOLOGICAL SBSTNC 02/09/2007    Family History  Problem Relation Age of Onset  . Diabetes Mother   . Colon cancer Mother        x2  . Arthritis Mother   . Hypertension Mother   . Endometrial cancer Mother   . Stroke Father   . Heart disease Father   . Colon cancer Father   . Arthritis Brother   . Hypertension Brother   . Hyperlipidemia Brother   . Infertility Brother   . Other Other        DISH brother  . Hypertension Maternal Grandmother   . Stroke Maternal Grandmother   . Anesthesia problems Neg Hx   . Hypotension Neg Hx   . Malignant hyperthermia Neg Hx   . Pseudochol deficiency Neg Hx     Social History   Socioeconomic History  . Marital status: Widowed    Spouse name: Not on file  . Number of children: Not on file  . Years of education: Not on file  . Highest education level: Not on file  Occupational History  .  Not on file  Social Needs  . Financial resource strain: Not on file  . Food insecurity    Worry: Not on file    Inability: Not on file  . Transportation needs    Medical: Not on file    Non-medical: Not on file  Tobacco Use  . Smoking status: Never Smoker  . Smokeless tobacco: Never Used  Substance and Sexual Activity  . Alcohol use: No    Alcohol/week: 0.0 standard drinks  . Drug use: No  . Sexual activity: Not Currently    Partners: Male    Birth control/protection: Surgical    Comment: TAH/BSO  Lifestyle  . Physical activity    Days per week: Not on file    Minutes per session: Not on file  . Stress: Not on file  Relationships  . Social Herbalist on phone: Not on file    Gets together: Not on file    Attends religious service: Not on file    Active member of club or organization: Not on file    Attends meetings of clubs or organizations: Not on file    Relationship status: Not on file  Other Topics Concern  . 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 acetaminophen (TYLENOL) 650 MG CR tablet Take 1,300 mg by mouth See admin instructions. Take 1300 mg in the morning and may take an additional 1300 mg dose as needed for pain    . amLODipine (NORVASC) 5 MG tablet TAKE 1 TABLET(5 MG) BY MOUTH DAILY 90 tablet 3  . amoxicillin (AMOXIL) 500 MG capsule Take 2,000 mg by mouth See admin instructions. Take 2000 mg 1 hour prior to dental work    . B-D ALLERGY SYRINGE 1CC/28G 28G X 1/2" 1 ML MISC USE AS DIRECTED ONCE WEEKLY 12 each 4  . Calcium Carb-Cholecalciferol (CALCIUM+D3 PO) Take 1 tablet by mouth daily.    . diphenhydramine-acetaminophen (TYLENOL PM) 25-500 MG TABS tablet Take 2 tablets by mouth at bedtime.    Marland Kitchen loratadine (CLARITIN) 10 MG tablet Take 10 mg by mouth daily as needed for allergies.    Marland Kitchen losartan (COZAAR) 100 MG tablet TAKE 1 TABLET(100 MG) BY MOUTH DAILY 90 tablet 3  . lovastatin (MEVACOR) 20 MG tablet TAKE 1 TABLET(20 MG) BY MOUTH AT BEDTIME 90 tablet 3  . methotrexate 50 MG/2ML injection Inject 0.5 mLs (12.5 mg total) into the skin once a week. 6 mL 0  . Multiple Vitamins-Minerals (MULTIVITAMIN ADULTS PO) Take 1 tablet by mouth daily.     Marland Kitchen omeprazole (PRILOSEC) 40 MG capsule Take 1 capsule (40 mg total) by mouth daily. 90 capsule 3  . SIMPONI 50 MG/0.5ML SOAJ INJECT 50MG  (1 PEN) UNDER THE SKIN ONCE MONTHLY 1.5 mL 0  . Olopatadine HCl (PATADAY OP) Place 1 drop into both eyes daily as needed (allergies).     No facility-administered medications prior to visit.      EXAM:  BP 136/78 (BP Location: Right Arm, Patient Position: Sitting, Cuff Size: Normal)   Pulse 78   Temp 98.2 F (36.8 C) (Temporal)   Ht 5\' 4"  (1.626 m)   Wt 213 lb (96.6 kg)   LMP 03/02/1984   SpO2 97%   BMI 36.56  kg/m   Body mass index is 36.56 kg/m.  GENERAL: vitals reviewed and  listed above, alert, oriented, appears well hydrated and in no acute distress walks independently   Left knee healing  HEENT: atraumatic, conjunctiva  clear, no obvious abnormalities on inspection of external nose and ears tms are clear no obv FB    NECK: no obvious masses on inspection palpation  LUNGS: clear to auscultation bilaterally, no wheezes, rales or rhonchi, good air movement kyphosis  CV: HRRR, no clubbing cyanosis or  peripheral edema nl cap refill  MS: moves all extremities left  Scar healing   Well   PSYCH: pleasant and cooperative, no obvious depression or anxiety Lab Results  Component Value Date   WBC 5.6 11/04/2018   HGB 12.8 11/04/2018   HCT 37.4 11/04/2018   PLT 348.0 11/04/2018   GLUCOSE 96 11/04/2018   CHOL 200 11/26/2017   TRIG 163.0 (H) 11/26/2017   HDL 58.20 11/26/2017   LDLDIRECT 114.0 04/02/2010   LDLCALC 109 (H) 11/26/2017   ALT 24 11/04/2018   AST 22 11/04/2018   NA 139 11/04/2018   K 4.0 11/04/2018   CL 100 11/04/2018   CREATININE 0.76 11/04/2018   BUN 19 11/04/2018   CO2 30 11/04/2018   TSH 1.27 11/26/2017   INR 0.96 06/16/2017   HGBA1C 5.6 11/04/2018   BP Readings from Last 3 Encounters:  11/04/18 136/78  08/01/18 134/77  07/26/18 (!) 161/79    ASSESSMENT AND PLAN:  Discussed the following assessment and plan:  Medication management - Plan: QuantiFERON-TB Gold Plus, CBC with Differential/Platelet, Basic metabolic panel, Hepatic function panel, Iron, TIBC and Ferritin Panel  Hyperglycemia - Plan: QuantiFERON-TB Gold Plus, CBC with Differential/Platelet, Basic metabolic panel, Hepatic function panel, Iron, TIBC and Ferritin Panel, Hemoglobin A1c  Essential hypertension - Plan: QuantiFERON-TB Gold Plus, CBC with Differential/Platelet, Basic metabolic panel, Hepatic function panel, Iron, TIBC and Ferritin Panel  History of iron deficiency  Psoriatic arthritis (HCC) - Plan: QuantiFERON-TB Gold Plus, CBC with Differential/Platelet, Basic  metabolic panel, Hepatic function panel, Iron, TIBC and Ferritin Panel  Hyperlipidemia, unspecified hyperlipidemia type - Plan: QuantiFERON-TB Gold Plus, CBC with Differential/Platelet, Basic metabolic panel, Hepatic function panel, Iron, TIBC and Ferritin Panel  Need for influenza vaccination - Plan: Flu Vaccine QUAD High Dose(Fluad)  Iron deficiency - Plan: QuantiFERON-TB Gold Plus, CBC with Differential/Platelet, Basic metabolic panel, Hepatic function panel, Iron, TIBC and Ferritin Panel  High risk medication use - Plan: QuantiFERON-TB Gold Plus, CBC with Differential/Platelet, Basic metabolic panel, Hepatic function panel, Iron, TIBC and Ferritin Panel, SAR CoV2 Serology (COVID 19)AB(IGG)IA  History of fever - Plan: SAR CoV2 Serology (COVID 19)AB(IGG)IA Finally seems to e past  Her surgical period and  Complications Plan to see yearly depending as she has a complete  Health team   -Patient advised to return or notify health care team  if  new concerns arise.  Patient Instructions   Will notify you  of labs when available today .  Wt Readings from Last 3 Encounters:  11/04/18 213 lb (96.6 kg)  08/01/18 216 lb 4.8 oz (98.1 kg)  07/26/18 216 lb 4.8 oz (98.1 kg)    BP Readings from Last 3 Encounters:  11/04/18 136/78  08/01/18 134/77  07/26/18 (!) 161/79    Will send results to dr August Luz.      Standley Brooking. Sullivan Jacuinde M.D.

## 2018-11-04 ENCOUNTER — Ambulatory Visit (INDEPENDENT_AMBULATORY_CARE_PROVIDER_SITE_OTHER): Payer: Medicare Other | Admitting: Internal Medicine

## 2018-11-04 ENCOUNTER — Encounter: Payer: Self-pay | Admitting: Internal Medicine

## 2018-11-04 ENCOUNTER — Other Ambulatory Visit: Payer: Self-pay

## 2018-11-04 VITALS — BP 136/78 | HR 78 | Temp 98.2°F | Ht 64.0 in | Wt 213.0 lb

## 2018-11-04 DIAGNOSIS — E611 Iron deficiency: Secondary | ICD-10-CM

## 2018-11-04 DIAGNOSIS — Z87898 Personal history of other specified conditions: Secondary | ICD-10-CM

## 2018-11-04 DIAGNOSIS — Z79899 Other long term (current) drug therapy: Secondary | ICD-10-CM | POA: Diagnosis not present

## 2018-11-04 DIAGNOSIS — Z23 Encounter for immunization: Secondary | ICD-10-CM

## 2018-11-04 DIAGNOSIS — R739 Hyperglycemia, unspecified: Secondary | ICD-10-CM

## 2018-11-04 DIAGNOSIS — Z8639 Personal history of other endocrine, nutritional and metabolic disease: Secondary | ICD-10-CM | POA: Diagnosis not present

## 2018-11-04 DIAGNOSIS — I10 Essential (primary) hypertension: Secondary | ICD-10-CM

## 2018-11-04 DIAGNOSIS — E785 Hyperlipidemia, unspecified: Secondary | ICD-10-CM

## 2018-11-04 DIAGNOSIS — L4050 Arthropathic psoriasis, unspecified: Secondary | ICD-10-CM | POA: Diagnosis not present

## 2018-11-04 LAB — HEPATIC FUNCTION PANEL
ALT: 24 U/L (ref 0–35)
AST: 22 U/L (ref 0–37)
Albumin: 4.5 g/dL (ref 3.5–5.2)
Alkaline Phosphatase: 94 U/L (ref 39–117)
Bilirubin, Direct: 0.1 mg/dL (ref 0.0–0.3)
Total Bilirubin: 0.7 mg/dL (ref 0.2–1.2)
Total Protein: 7.4 g/dL (ref 6.0–8.3)

## 2018-11-04 LAB — CBC WITH DIFFERENTIAL/PLATELET
Basophils Absolute: 0 10*3/uL (ref 0.0–0.1)
Basophils Relative: 0.7 % (ref 0.0–3.0)
Eosinophils Absolute: 0 10*3/uL (ref 0.0–0.7)
Eosinophils Relative: 0.7 % (ref 0.0–5.0)
HCT: 37.4 % (ref 36.0–46.0)
Hemoglobin: 12.8 g/dL (ref 12.0–15.0)
Lymphocytes Relative: 33.4 % (ref 12.0–46.0)
Lymphs Abs: 1.9 10*3/uL (ref 0.7–4.0)
MCHC: 34.1 g/dL (ref 30.0–36.0)
MCV: 93.9 fl (ref 78.0–100.0)
Monocytes Absolute: 0.3 10*3/uL (ref 0.1–1.0)
Monocytes Relative: 5.3 % (ref 3.0–12.0)
Neutro Abs: 3.3 10*3/uL (ref 1.4–7.7)
Neutrophils Relative %: 59.9 % (ref 43.0–77.0)
Platelets: 348 10*3/uL (ref 150.0–400.0)
RBC: 3.99 Mil/uL (ref 3.87–5.11)
RDW: 13.7 % (ref 11.5–15.5)
WBC: 5.6 10*3/uL (ref 4.0–10.5)

## 2018-11-04 LAB — BASIC METABOLIC PANEL
BUN: 19 mg/dL (ref 6–23)
CO2: 30 mEq/L (ref 19–32)
Calcium: 10 mg/dL (ref 8.4–10.5)
Chloride: 100 mEq/L (ref 96–112)
Creatinine, Ser: 0.76 mg/dL (ref 0.40–1.20)
GFR: 76.04 mL/min (ref >60.00)
Glucose, Bld: 96 mg/dL (ref 70–99)
Potassium: 4 mEq/L (ref 3.5–5.1)
Sodium: 139 mEq/L (ref 135–145)

## 2018-11-04 LAB — HEMOGLOBIN A1C: Hgb A1c MFr Bld: 5.6 % (ref 4.6–6.5)

## 2018-11-04 NOTE — Patient Instructions (Signed)
Will notify you  of labs when available today .  Wt Readings from Last 3 Encounters:  11/04/18 213 lb (96.6 kg)  08/01/18 216 lb 4.8 oz (98.1 kg)  07/26/18 216 lb 4.8 oz (98.1 kg)    BP Readings from Last 3 Encounters:  11/04/18 136/78  08/01/18 134/77  07/26/18 (!) 161/79    Will send results to dr August Luz.

## 2018-11-08 LAB — SAR COV2 SEROLOGY (COVID19)AB(IGG),IA: SARS CoV2 AB IGG: NEGATIVE

## 2018-11-08 LAB — QUANTIFERON-TB GOLD PLUS
Mitogen-NIL: 1.38 [IU]/mL
NIL: 0.02 [IU]/mL
QuantiFERON-TB Gold Plus: NEGATIVE
TB1-NIL: 0 [IU]/mL
TB2-NIL: 0 [IU]/mL

## 2018-11-08 LAB — IRON,TIBC AND FERRITIN PANEL
%SAT: 20 % (ref 16–45)
Ferritin: 115 ng/mL (ref 16–288)
Iron: 67 ug/dL (ref 45–160)
TIBC: 332 ug/dL (ref 250–450)

## 2018-11-09 DIAGNOSIS — M25561 Pain in right knee: Secondary | ICD-10-CM | POA: Diagnosis not present

## 2018-11-09 DIAGNOSIS — M722 Plantar fascial fibromatosis: Secondary | ICD-10-CM | POA: Diagnosis not present

## 2018-11-09 DIAGNOSIS — M25562 Pain in left knee: Secondary | ICD-10-CM | POA: Diagnosis not present

## 2018-11-16 DIAGNOSIS — M25562 Pain in left knee: Secondary | ICD-10-CM | POA: Diagnosis not present

## 2018-11-16 DIAGNOSIS — M722 Plantar fascial fibromatosis: Secondary | ICD-10-CM | POA: Diagnosis not present

## 2018-11-16 DIAGNOSIS — M25561 Pain in right knee: Secondary | ICD-10-CM | POA: Diagnosis not present

## 2018-11-18 DIAGNOSIS — L578 Other skin changes due to chronic exposure to nonionizing radiation: Secondary | ICD-10-CM | POA: Diagnosis not present

## 2018-11-18 DIAGNOSIS — D2239 Melanocytic nevi of other parts of face: Secondary | ICD-10-CM | POA: Diagnosis not present

## 2018-11-18 DIAGNOSIS — D2262 Melanocytic nevi of left upper limb, including shoulder: Secondary | ICD-10-CM | POA: Diagnosis not present

## 2018-11-18 DIAGNOSIS — D225 Melanocytic nevi of trunk: Secondary | ICD-10-CM | POA: Diagnosis not present

## 2018-11-18 DIAGNOSIS — D485 Neoplasm of uncertain behavior of skin: Secondary | ICD-10-CM | POA: Diagnosis not present

## 2018-11-18 DIAGNOSIS — D1801 Hemangioma of skin and subcutaneous tissue: Secondary | ICD-10-CM | POA: Diagnosis not present

## 2018-11-18 DIAGNOSIS — L918 Other hypertrophic disorders of the skin: Secondary | ICD-10-CM | POA: Diagnosis not present

## 2018-11-18 DIAGNOSIS — D2272 Melanocytic nevi of left lower limb, including hip: Secondary | ICD-10-CM | POA: Diagnosis not present

## 2018-11-18 DIAGNOSIS — L814 Other melanin hyperpigmentation: Secondary | ICD-10-CM | POA: Diagnosis not present

## 2018-11-18 DIAGNOSIS — L821 Other seborrheic keratosis: Secondary | ICD-10-CM | POA: Diagnosis not present

## 2018-11-22 DIAGNOSIS — M722 Plantar fascial fibromatosis: Secondary | ICD-10-CM | POA: Diagnosis not present

## 2018-11-22 DIAGNOSIS — M25561 Pain in right knee: Secondary | ICD-10-CM | POA: Diagnosis not present

## 2018-11-22 DIAGNOSIS — M25562 Pain in left knee: Secondary | ICD-10-CM | POA: Diagnosis not present

## 2018-11-24 DIAGNOSIS — M722 Plantar fascial fibromatosis: Secondary | ICD-10-CM | POA: Diagnosis not present

## 2018-11-24 DIAGNOSIS — M25561 Pain in right knee: Secondary | ICD-10-CM | POA: Diagnosis not present

## 2018-11-24 DIAGNOSIS — M25562 Pain in left knee: Secondary | ICD-10-CM | POA: Diagnosis not present

## 2018-11-28 ENCOUNTER — Other Ambulatory Visit: Payer: Self-pay | Admitting: Rheumatology

## 2018-11-28 DIAGNOSIS — M25561 Pain in right knee: Secondary | ICD-10-CM | POA: Diagnosis not present

## 2018-11-28 DIAGNOSIS — M25562 Pain in left knee: Secondary | ICD-10-CM | POA: Diagnosis not present

## 2018-11-28 DIAGNOSIS — M722 Plantar fascial fibromatosis: Secondary | ICD-10-CM | POA: Diagnosis not present

## 2018-11-28 NOTE — Telephone Encounter (Signed)
Last Visit:07/14/18 Next Visit:12/14/18 Labs: 11/04/18 WNL  Okay to refill per Dr. Estanislado Pandy

## 2018-11-30 DIAGNOSIS — M25561 Pain in right knee: Secondary | ICD-10-CM | POA: Diagnosis not present

## 2018-11-30 DIAGNOSIS — M25562 Pain in left knee: Secondary | ICD-10-CM | POA: Diagnosis not present

## 2018-11-30 DIAGNOSIS — M722 Plantar fascial fibromatosis: Secondary | ICD-10-CM | POA: Diagnosis not present

## 2018-11-30 NOTE — Progress Notes (Signed)
Office Visit Note  Patient: Jade Cardenas             Date of Birth: Jul 19, 1950           MRN: [id number]             PCP: [name], MD Referring: [name], MD Visit Date: 12/14/2018 Occupation: @GUAROCC @  Subjective:  Right shoulder joint pain   History of Present Illness: Jade Cardenas is a 68 y.o. female with history of psoriatic arthritis, osteoarthritis, DDD, and fibromyalgia. She has noticed more frequent psoriatic flares since her last visit. She had surgery on her left knee on 08/01/2018 and held Simponi and methotrexate for 3-4 weeks.She developed cellulitis post-surgery and held medications.  She states cellulitis has totally resolved after taking Doxycycline in July.   Her last Simponi dose September 15th.  She has discomfort in her right shoulder and neck.  She is interested in having a shoulder injection.  She is going to physical therapy for her knee and therapist recommended for her shoulder and neck as well.  She has discomfort in her bilateral knees, feet, and ankles.  She denies any joint swelling or redness.  She complains of fatigue.  She states she has decreased grip strength and drops things frequently.  She denies any rashes.     Activities of Daily Living:  Patient reports morning stiffness for  several hours.   Patient Reports nocturnal pain.  Difficulty dressing/grooming: Reports Difficulty climbing stairs: Reports Difficulty getting out of chair: Reports Difficulty using hands for taps, buttons, cutlery, and/or writing: Reports  Review of Systems  Constitutional: Positive for fatigue.  HENT: Positive for mouth dryness (Biotene products ). Negative for mouth sores and nose dryness.   Eyes: Positive for dryness. Negative for pain and visual disturbance.  Respiratory: Negative for cough, hemoptysis, shortness of breath and difficulty breathing.   Cardiovascular: Negative for chest pain, palpitations, hypertension, irregular heartbeat and  swelling in legs/feet.  Gastrointestinal: Negative for blood in stool, constipation and diarrhea.  Endocrine: Negative for increased urination.  Genitourinary: Negative for painful urination.  Musculoskeletal: Positive for arthralgias, joint pain, morning stiffness and muscle tenderness. Negative for joint swelling, myalgias, muscle weakness and myalgias.  Skin: Negative for color change, pallor, rash, hair loss, nodules/bumps, skin tightness, ulcers and sensitivity to sunlight.  Allergic/Immunologic: Negative for susceptible to infections.  Neurological: Negative for dizziness, numbness, headaches and weakness.  Hematological: Negative for swollen glands.  Psychiatric/Behavioral: Negative for depressed mood and sleep disturbance. The patient is not nervous/anxious.     PMFS History:  Patient Active Problem List   Diagnosis Date Noted  . History of total knee replacement, left 06/28/2017  . OA (osteoarthritis) of knee 06/21/2017  . Varicose veins of bilateral lower extremities with other complications [id number]  . DDD (degenerative disc disease), lumbar 10/19/2016  . Goiter 08/28/2016  . Family history of malignant neoplasm of digestive organ 08/27/2016  . 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 Fibromyalgia 05/17/2016  . High risk medication use 02/18/2016  . DJD (degenerative joint disease), cervical 02/18/2016  . Heel spur 09/29/2015  . Ingrown toenail 09/29/2015  . Foraminal stenosis of cervical region 07/30/2015  . Microcytic hypochromic anemia 05/22/2015  . Tailor's bunion, left 01/18/2015  . H/O hiatal hernia   . Bilateral primary osteoarthritis of knee 12/15/2013  . Iron deficiency 12/15/2013  . Psoriatic arthritis ([place]) 12/15/2013  . History of foot fracture 12/15/2013  . Dermatitis 10/17/2013  . Arthritis 05/31/2013  . Hx  of diethylstilbestrol (DES) exposure in utero unknown 12/09/2012  . Skin lesion 12/09/2012  . Tinnitus 06/23/2012  . Scalp lesion 01/21/2012  .  Headache(784.0) 01/21/2012  . Hx of psoriasis 01/21/2012  . Weight gain 01/21/2012  . Ankylosing spondylitis  possible 09/03/2011  . Hx of transfusion 09/03/2011  . S/P lumbar fusion 6 13 09/03/2011  . Lumbar stenosis with neurogenic claudication 08/05/2011  . LBP (low back pain) 01/17/2011  . ALLERGIC RHINITIS 02/19/2010  . GERD 02/19/2010  . ARTHRITIS 02/19/2010  . SPINAL STENOSIS, CERVICAL 02/19/2010  . OSTEOPENIA 02/19/2010  . RASH AND OTHER NONSPECIFIC SKIN ERUPTION 12/07/2007  . IBS 04/28/2007  . History of gastric ulcer 02/09/2007  . COLONIC POLYPS, HX OF 02/09/2007  . CHOLECYSTECTOMY, HX OF 02/09/2007  . Hyperlipidemia 12/29/2006  . Essential hypertension 12/29/2006    Past Medical History:  Diagnosis Date  . Anemia    iron def  . Arthritis    rheumatoid and osteo now flt to be psoriateic   . Arthritis    psoreratic, osteoarthritis  . Bone spur    RIGHT FOOT   . Broken toe   . Bruises easily   . CHOLECYSTECTOMY, HX OF   . Chronic back pain   . COLONIC POLYPS, HX OF 02/09/2007  . Cough, persistent 01/21/2012   poss from acei   . 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 Femur fracture, left ([place])   . Fibromyalgia    doesn't require meds  . GASTRIC ULCER, ACUTE, HEMORRHAGE, HX OF 02/09/2007  . GERD (gastroesophageal reflux disease)    takes Nexium daily  . Goiter   . H/O hiatal hernia   . Headache(784.0)    occasionally  . History of bronchitis   . Hx of seasonal allergies    takes Claritin daily  . HYPERLIPIDEMIA 12/29/2006   takes Lovastatin nightly  . HYPERTENSION 12/29/2006   takes Enalapril daily  . IBS 04/28/2007  . Insomnia    related to pain;takes Flexeril and Tylenol PM nightly  . Joint pain   . Joint swelling   . Palpitations 02/09/2007  . PONV (postoperative nausea and vomiting)    NAUSEA  . REDUCTION MAMMOPLASTY, HX OF 02/09/2007  . Rheumatoid arthritis(714.0) 12/29/2006  . Right bundle branch block 02/09/2007  . S/P lumbar fusion 6 13  09/03/2011   L4 L5  posterior   . SYNCOPE 12/07/2007  . Tendonitis    RLE   . UNS ADVRS EFF OTH RX MEDICINAL\T\BIOLOGICAL SBSTNC 02/09/2007    Family History  Problem Relation Age of Onset  . Diabetes Mother   . Colon cancer Mother        x2  . Arthritis Mother   . Hypertension Mother   . Endometrial cancer Mother   . Stroke Father   . Heart disease Father   . Colon cancer Father   . Arthritis Brother   . Hypertension Brother   . Hyperlipidemia Brother   . Infertility Brother   . Other Other        DISH brother  . Hypertension Maternal Grandmother   . Stroke Maternal Grandmother   . Anesthesia problems Neg Hx   . Hypotension Neg Hx   . Malignant hyperthermia Neg Hx   . Pseudochol deficiency Neg Hx    Past Surgical History:  Procedure Laterality Date  . BACK SURGERY  12+yrs ago   Synovial Cyst removal  . BACK SURGERY  6/13   lumbar L4-L5 fusion, lamenectomy  . bone spur  6-49yrs  ago   right ankle  . BUNIONECTOMY  10/03/14   right foot  . CERVICAL FUSION     2 12   . CHOLECYSTECTOMY  1996  . HARDWARE REMOVAL Left 08/01/2018   Procedure: Left knee hardware removal;  Surgeon: Gaynelle Arabian, MD;  Location: [facility];  Service: Orthopedics;  Laterality: Left;  81min  . JOINT REPLACEMENT    . KNEE ARTHROPLASTY    . POSTERIOR CERVICAL LAMINECTOMY Left 07/30/2015   Procedure: Laminectomy and Foraminotomy - left - Cervical two -Cervical three;  Surgeon: Earnie Larsson, MD;  Location: [facility];  Service: Neurosurgery;  Laterality: Left;  . REDUCTION MAMMAPLASTY Bilateral 1989  . TONSILLECTOMY     as a child  . TOTAL ABDOMINAL HYSTERECTOMY W/ BILATERAL SALPINGOOPHORECTOMY  1986  . 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 Fluad Quad(high Dose 65+) 11/04/2018  . Influenza Split 11/29/2012, 11/27/2013  . Influenza Whole 11/09/2008  . Influenza, High Dose Seasonal PF 11/26/2015, 11/30/2016, 11/03/2017  . Influenza,inj,Quad PF,6+ Mos 11/27/2014  . Influenza-Unspecified 12/22/2016  . PPD Test 12/18/2013, 11/27/2014, 11/26/2015, 11/30/2016  . Pneumococcal Conjugate-13 02/28/2013  . Pneumococcal Polysaccharide-23 03/03/2003, 02/28/2014  . Td 05/09/2008  . Tdap 07/19/2018  . Zoster 01/20/2011  . Zoster Recombinat (Shingrix) 12/03/2016, 02/04/2017     Objective: Vital Signs: BP (!) 146/82 (BP Location: Left Arm, Patient Position: Sitting, Cuff Size: Normal)   Pulse 83   Resp 14   Ht 5\' 4"  (1.626 m)   Wt 218 lb 6.4 oz (99.1 kg)   LMP 03/02/1984   BMI 37.49 kg/m    Physical Exam Vitals signs and nursing note reviewed.  Constitutional:      Appearance: She is well-developed.  HENT:     Head: Normocephalic and atraumatic.  Eyes:     Conjunctiva/sclera: Conjunctivae normal.  Neck:     Musculoskeletal: Normal range of motion.  Cardiovascular:     Rate and Rhythm: Normal rate and regular rhythm.     Heart sounds: Normal heart sounds.  Pulmonary:     Effort: Pulmonary effort is normal.     Breath sounds: Normal breath sounds.  Abdominal:     General: Bowel sounds are normal.     Palpations: Abdomen is soft.  Lymphadenopathy:     Cervical: No cervical adenopathy.  Skin:    General: Skin is warm and dry.     Capillary Refill: Capillary refill takes less than 2 seconds.  Neurological:     Mental Status: She is alert and oriented to person, place, and time.  Psychiatric:        Behavior: Behavior normal.      Musculoskeletal Exam: C-spine limited ROM.  Thoracic kyphosis noted.  Lumbar spine good ROM.  Left shoulder good ROM with no discomfort.  Right shoulder abduction to 90 degrees.  Elbow joints, wrist joints, MCPs, PIPs, and DIPs good ROM with no synovitis.  Complete fist formation bilaterally. Hip joints good ROM.  Left knee joint replacement good ROM.  Right knee good ROM with no warmth or effusion.  Ankle joints good ROM  with no discomfort.  No tenderness or inflammation of MTP joints.  Hammertoes and overcrowding of toes noted.   CDAI Exam: CDAI Score: 1  Patient Global: 6 mm; Provider Global: 4 mm Swollen: 0 ; Tender: 0  Joint Exam   No joint exam has been documented for this visit   There is currently no information documented on the homunculus. Go to the Rheumatology activity and complete the homunculus joint exam.  Investigation: No additional findings.  Imaging: Xr Foot 2 Views Left  Result Date: 12/14/2018 First MTP, PIP and DIP narrowing was noted.  Juxta-articular osteopenia was noted.  Erosive versus postsurgical changes were noted in the fifth MTP joint.  A screw was noted in the fifth metatarsal.  Dorsal spurring was noted.  No significant intertarsal or tibiotalar joint space narrowing was noted.  Inferior and posterior calcaneal spurs were noted. Impression: These findings are consistent with rheumatoid arthritis and osteoarthritis overlap.  Xr Foot 2 Views Right  Result Date: 12/14/2018 Severe first MTP narrowing was noted.  PIP and DIP narrowing was noted.  Cystic changes were noted in the fourth and fifth MTP joints with a screw in the fifth metatarsal.  No intertarsal narrowing was noted.  No tibiotalar joint space narrowing was noted.  Inferior and posterior calcaneal spurs were noted.  Juxta-articular osteopenia was noted. Impression: These findings are consistent with rheumatoid arthritis and osteoarthritis overlap.  Xr Hand 2 View Left  Result Date: 12/14/2018 Juxta-articular osteopenia was noted.  CMC PIP and DIP narrowing was noted.  Narrowing of all MCP joints without any erosive changes were  noted.  Metacarpocarpal, intercarpal and radiocarpal joint space narrowing was noted.  Possible cystic versus erosive changes were noted in the carpal bones. Impression: These findings were consistent with rheumatoid arthritis and osteoarthritis overlap.  Xr Hand 2 View Right  Result Date: 12/14/2018 Juxta-articular osteopenia was noted.  CMC PIP and DIP narrowing was noted.  Narrowing of all MCP joints was noted without any erosive changes.  Intercarpal radiocarpal joint space narrowing with no erosive changes were noted. Impression: These findings are consistent with rheumatoid arthritis and osteoarthritis overlap.  Xr Knee 3 View Right  Result Date: 12/14/2018 Moderate medial compartment narrowing was noted.  Severe patellofemoral narrowing was noted.  No chondrocalcinosis was noted. Impression: These findings are consistent with moderate osteoarthritis and severe chondromalacia patella.  Xr Shoulder Right  Result Date: 12/14/2018 Acromioclavicular joint space narrowing was noted.  No significant glenohumeral joint space narrowing was noted.  Inferior spurring was noted.  No chondrocalcinosis was noted. Impression: These findings are consistent with acromioclavicular arthritis of the shoulder joint.   Recent Labs: Lab Results  Component Value Date   WBC 5.6 11/04/2018   HGB 12.8 11/04/2018   PLT 348.0 11/04/2018   NA 139 11/04/2018   K 4.0 11/04/2018   CL 100 11/04/2018   CO2 30 11/04/2018   GLUCOSE 96 11/04/2018   BUN 19 11/04/2018   CREATININE 0.76 11/04/2018   BILITOT 0.7 11/04/2018   ALKPHOS 94 11/04/2018   AST 22 11/04/2018   ALT 24 11/04/2018   PROT 7.4 11/04/2018   ALBUMIN 4.5 11/04/2018   CALCIUM 10.0 11/04/2018   GFRAA >60 07/26/2018   QFTBGOLDPLUS NEGATIVE 11/04/2018    Speciality Comments: Failed therapy: Arava and Otezla due to diarrhea  Procedures:  Large Joint Inj: R glenohumeral on 12/14/2018 10:42 AM Indications: pain Details: 27 G 1.5 in needle,  posterior approach  Arthrogram: No  Medications: 1.5 mL lidocaine 1 %;  40 mg triamcinolone acetonide 40 MG/ML Aspirate: 0 mL Outcome: tolerated well, no immediate complications Procedure, treatment alternatives, risks and benefits explained, specific risks discussed. Consent was given by the patient. Immediately prior to procedure a time out was called to verify the correct patient, procedure, equipment, support staff and site/side marked as required. Patient was prepped and draped in the usual sterile fashion.     Allergies: Erythromycin, Iodinated diagnostic agents, Ace inhibitors, Keflex [cephalexin], and Macrobid [nitrofurantoin]     Assessment / Plan:     Visit Diagnoses: Psoriatic arthritis ([place]) - She has no synovitis or dactylitis on exam.  She is having increased arthralgias and joint stiffness.  She has no Achilles tendinitis or plantar fasciitis.  She has no SI joint tenderness.  She has no active psoriasis at this time.  She is on Simponi 50 mg having his injections every 28 days and methotrexate 0.5 mL subcu injections once weekly.  On 08/01/2018 she had a left knee joint revision performed by Dr. Wynelle Link.  She had [facility] and methotrexate for 3 to 4 weeks.  She developed cellulitis postoperatively and was treated with doxycycline which she completed in July.  She held her medications during that time.  She has resumed both methotrexate and Simponi.  Her last Simponi injection was on 11/15/2018.  She is having increased pain in the right shoulder joint and right knee joint.  X-rays of both hands, both feet, right shoulder, and right knee were obtained today.  Results were reviewed with the patient today in the office.  All questions were addressed.  She will be referred for physical therapy for the right shoulder joint.  She also the right shoulder joint cortisone injection today.  She will continue on the current treatment regimen.  She does not appear to be having a psoriatic arthritis  flare.  She does not need any refills at this time.  She was advised to notify us if she develops increased joint pain or joint swelling.  She will follow-up in the office in 3 months.   Plan: XR Hand 2 View Right, XR Hand 2 View Left, XR Foot 2 Views Right, XR Foot 2 Views Left  High risk medication use - Simponi 50 mg every 28 days and methotrexate 0.5 ml every 7 days.  Last TB gold negative on 11/04/2018 and will monitor yearly.  Most recent CBC/CMP within normal limits on 11/04/2018 and will monitor every 3 months. Jolee Ewing and [place] caused diarrhea).   Hx of psoriasis: She has no psoriasis at this time.   Primary osteoarthritis of right knee: X-rays of the right knee were obtained today which revealed moderate osteoarthritis and severe chondromalacia patella.  No warmth or effusion was noted on exam.  She has good range of motion with some discomfort.  Chronic pain of right knee - She has chronic right knee joint pain.  She is good range of motion with discomfort.  No warmth or effusion was noted.  She has moderate osteoarthritis and severe chondromalacia patella on x-rays from today. Plan: XR KNEE 3 VIEW RIGHT  Total knee replacement status, left - Dr. Wynelle Link:   Fibromyalgia: She continues have generalized muscle aches and muscle tenderness due to fibromyalgia.  She has chronic fatigue related to insomnia.  She often has difficulty staying asleep but is started to use essentially as before bed which has helped with her difficulty falling asleep.  Chronic fatigue: Chronic but stable.   Osteopenia of multiple sites - DEXA 09/15/17 osteopenia T -  1.8.  She is taking calcium and vitamin D supplement.  DDD (degenerative disc disease), cervical - S/P fusion: She has limited range of motion with discomfort.  No symptoms of radiculopathy.  DDD (degenerative disc disease), lumbar - S/P fusion: She has intermittent lower back pain.  She has no symptoms of radiculopathy.  Chronic right shoulder pain -She  presents today with right shoulder joint pain.  She has limited abduction to about 90 degrees.  No discomfort or limitation with internal rotation.  X-rays of the right shoulder were obtained today.  Findings were consistent with [facility] joint arthritis.  She requested a right shoulder joint cortisone injection.  She tolerated the procedure well.  Procedure note was completed above.  She is currently going to physical therapy for her left knee and requested an order for evaluation and treatment of the right shoulder.  Referral was placed today.  Plan: XR Shoulder Right  Arthritis of right acromioclavicular joint: She presents today with increased right shoulder joint pain.  She has limited range of motion as discussed above.  She requested a right shoulder joint cortisone injection.  She tolerated the procedure well.  An order for physical therapy was also placed today.      Orders: Orders Placed This Encounter  Procedures  . Large Joint Inj  . XR Hand 2 View Right  . XR Hand 2 View Left  . XR Shoulder Right  . XR KNEE 3 VIEW RIGHT  . XR Foot 2 Views Right  . XR Foot 2 Views Left   No orders of the defined types were placed in this encounter.   Face-to-face time spent with patient was 30 minutes. Greater than 50% of time was spent in counseling and coordination of care.  Follow-Up Instructions: Return in about 3 months (around 03/16/2019) for Psoriatic arthritis, Osteoarthritis, Fibromyalgia.   Ofilia Neas, PA-C   I examined and evaluated the patient with Hazel Sams PA.  Patient was experiencing a lot of discomfort today.  She felt that she was having a flare of psoriatic arthritis.  She had no synovitis on my examination today.  She had interruption in her methotrexate therapy which could have caused increased discomfort.  We discouraged the use of prednisone at this time.  I believe some of her discomfort is coming from fibromyalgia as well.  The plan of care was discussed as noted above.   Bo Merino, MD  Note - This record has been created using Editor, commissioning.  Chart creation errors have been sought, but may not always  have been located. Such creation errors do not reflect on  the standard of medical care.

## 2018-12-02 ENCOUNTER — Other Ambulatory Visit: Payer: Self-pay | Admitting: Rheumatology

## 2018-12-02 NOTE — Telephone Encounter (Signed)
Last Visit:07/14/18 Next Visit:12/14/18 Labs: 11/04/18 WNL TB Gold: 11/04/18 Neg   Okay to refill per Dr. Estanislado Pandy

## 2018-12-05 DIAGNOSIS — M25561 Pain in right knee: Secondary | ICD-10-CM | POA: Diagnosis not present

## 2018-12-05 DIAGNOSIS — M25562 Pain in left knee: Secondary | ICD-10-CM | POA: Diagnosis not present

## 2018-12-05 DIAGNOSIS — M722 Plantar fascial fibromatosis: Secondary | ICD-10-CM | POA: Diagnosis not present

## 2018-12-07 DIAGNOSIS — M25562 Pain in left knee: Secondary | ICD-10-CM | POA: Diagnosis not present

## 2018-12-07 DIAGNOSIS — M25561 Pain in right knee: Secondary | ICD-10-CM | POA: Diagnosis not present

## 2018-12-07 DIAGNOSIS — M722 Plantar fascial fibromatosis: Secondary | ICD-10-CM | POA: Diagnosis not present

## 2018-12-12 ENCOUNTER — Telehealth: Payer: Self-pay | Admitting: Pharmacist

## 2018-12-12 NOTE — Telephone Encounter (Signed)
Received fax from [facility], application has been EXTENDED with a coverage end date of 03/01/2020  Will send document to [facility].  Phone# [phone] Fax# [phone]

## 2018-12-14 ENCOUNTER — Encounter: Payer: Self-pay | Admitting: Rheumatology

## 2018-12-14 ENCOUNTER — Ambulatory Visit (INDEPENDENT_AMBULATORY_CARE_PROVIDER_SITE_OTHER): Payer: Medicare Other

## 2018-12-14 ENCOUNTER — Ambulatory Visit (INDEPENDENT_AMBULATORY_CARE_PROVIDER_SITE_OTHER): Payer: Medicare Other | Admitting: Rheumatology

## 2018-12-14 ENCOUNTER — Other Ambulatory Visit: Payer: Self-pay

## 2018-12-14 VITALS — BP 146/82 | HR 83 | Resp 14 | Ht 64.0 in | Wt 218.4 lb

## 2018-12-14 DIAGNOSIS — M25511 Pain in right shoulder: Secondary | ICD-10-CM

## 2018-12-14 DIAGNOSIS — M797 Fibromyalgia: Secondary | ICD-10-CM | POA: Diagnosis not present

## 2018-12-14 DIAGNOSIS — L4050 Arthropathic psoriasis, unspecified: Secondary | ICD-10-CM

## 2018-12-14 DIAGNOSIS — M5136 Other intervertebral disc degeneration, lumbar region: Secondary | ICD-10-CM

## 2018-12-14 DIAGNOSIS — Z79899 Other long term (current) drug therapy: Secondary | ICD-10-CM

## 2018-12-14 DIAGNOSIS — R5382 Chronic fatigue, unspecified: Secondary | ICD-10-CM

## 2018-12-14 DIAGNOSIS — M25561 Pain in right knee: Secondary | ICD-10-CM

## 2018-12-14 DIAGNOSIS — G8929 Other chronic pain: Secondary | ICD-10-CM

## 2018-12-14 DIAGNOSIS — M8589 Other specified disorders of bone density and structure, multiple sites: Secondary | ICD-10-CM

## 2018-12-14 DIAGNOSIS — M1711 Unilateral primary osteoarthritis, right knee: Secondary | ICD-10-CM | POA: Diagnosis not present

## 2018-12-14 DIAGNOSIS — Z96652 Presence of left artificial knee joint: Secondary | ICD-10-CM

## 2018-12-14 DIAGNOSIS — Z872 Personal history of diseases of the skin and subcutaneous tissue: Secondary | ICD-10-CM | POA: Diagnosis not present

## 2018-12-14 DIAGNOSIS — M5030 Other cervical disc degeneration, unspecified cervical region: Secondary | ICD-10-CM | POA: Diagnosis not present

## 2018-12-14 DIAGNOSIS — M19011 Primary osteoarthritis, right shoulder: Secondary | ICD-10-CM

## 2018-12-14 NOTE — Progress Notes (Signed)
Pharmacy Note  Subjective: Patient presents today to the [facility] to see Dr. Estanislado Pandy for psoriatic arthritis follow up.   Patient seen by the pharmacist for medication review. Current medication regimen includes Simponi and methotrexate.  She is up to date with her TB gold and CBC/CMP.   Prior therapy includes [place] which she discontinued due to diarrhea.  She has noticed more frequent psoriatic flares since her last visit. She had surgery on her left knee and held Simponi and methotrexate for 3-4 weeks.  Her last Simponi dose September 15th.  Medication Review:  Does the patient feel that his/her medications are effective in managing symptoms? No Has the patient been experiencing any side effects to the medications prescribed?   No Has the patient missed any doses in the past few weeks? Yes Does the patient have any problems obtaining medications from the pharmacy?   No Does the patient have difficulty affording their medications?  No.  She is enrolled in [place] and [place] support.  Objective: Current Outpatient Medications on File Prior to Visit  Medication Sig Dispense Refill  . acetaminophen (TYLENOL) 650 MG CR tablet Take 1,300 mg by mouth See admin instructions. Take 1300 mg in the morning and may take an additional 1300 mg dose as needed for pain    . amLODipine (NORVASC) 5 MG tablet TAKE 1 TABLET(5 MG) BY MOUTH DAILY 90 tablet 3  . amoxicillin (AMOXIL) 500 MG capsule Take 2,000 mg by mouth See admin instructions. Take 2000 mg 1 hour prior to dental work    . B-D ALLERGY SYRINGE 1CC/28G 28G X 1/2" 1 ML MISC USE AS DIRECTED ONCE WEEKLY 12 each 4  . Calcium Carb-Cholecalciferol (CALCIUM+D3 PO) Take 1 tablet by mouth daily.    . diphenhydramine-acetaminophen (TYLENOL PM) 25-500 MG TABS tablet Take 2 tablets by mouth at bedtime.    Marland Kitchen losartan (COZAAR) 100 MG tablet TAKE 1 TABLET(100 MG) BY MOUTH DAILY 90 tablet 3  . lovastatin (MEVACOR) 20 MG tablet TAKE 1  TABLET(20 MG) BY MOUTH AT BEDTIME 90 tablet 3  . methotrexate 50 MG/2ML injection INJECT 0.5 ML (12.5 MG TOTAL) INTO THE SKIN ONCE A WEEK. 6 mL 0  . Multiple Vitamins-Minerals (MULTIVITAMIN ADULTS PO) Take 1 tablet by mouth daily.     Marland Kitchen omeprazole (PRILOSEC) 40 MG capsule Take 1 capsule (40 mg total) by mouth daily. 90 capsule 3  . SIMPONI 50 MG/0.5ML SOAJ INJECT 50MG  (1 PEN) UNDER THE SKIN ONCE MONTHLY 1.5 mL 0  . loratadine (CLARITIN) 10 MG tablet Take 10 mg by mouth daily as needed for allergies.     No current facility-administered medications on file prior to visit.      CBC    Component Value Date/Time   WBC 5.6 11/04/2018 1351   RBC 3.99 11/04/2018 1351   HGB 12.8 11/04/2018 1351   HGB 13.2 10/28/2016 1029   HCT 37.4 11/04/2018 1351   HCT 37.5 10/28/2016 1029   PLT 348.0 11/04/2018 1351   PLT 276 10/28/2016 1029   MCV 93.9 11/04/2018 1351   MCV 99.5 10/28/2016 1029   MCH 32.7 07/26/2018 1130   MCHC 34.1 11/04/2018 1351   RDW 13.7 11/04/2018 1351   RDW 12.7 10/28/2016 1029   LYMPHSABS 1.9 11/04/2018 1351   LYMPHSABS 1.9 10/28/2016 1029   MONOABS 0.3 11/04/2018 1351   MONOABS 0.3 10/28/2016 1029   EOSABS 0.0 11/04/2018 1351   EOSABS 0.1 10/28/2016 1029   BASOSABS 0.0 11/04/2018 1351  BASOSABS 0.0 10/28/2016 1029     CMP     Component Value Date/Time   NA 139 11/04/2018 1351   NA 140 10/28/2016 1029   K 4.0 11/04/2018 1351   K 4.3 10/28/2016 1029   CL 100 11/04/2018 1351   CO2 30 11/04/2018 1351   CO2 29 10/28/2016 1029   GLUCOSE 96 11/04/2018 1351   GLUCOSE 95 10/28/2016 1029   BUN 19 11/04/2018 1351   BUN 17.9 10/28/2016 1029   CREATININE 0.76 11/04/2018 1351   CREATININE 0.67 03/08/2018 1317   CREATININE 0.8 10/28/2016 1029   CALCIUM 10.0 11/04/2018 1351   CALCIUM 9.9 10/28/2016 1029   PROT 7.4 11/04/2018 1351   PROT 7.2 10/28/2016 1029   ALBUMIN 4.5 11/04/2018 1351   ALBUMIN 3.8 10/28/2016 1029   AST 22 11/04/2018 1351   AST 19 10/28/2016 1029    ALT 24 11/04/2018 1351   ALT 23 10/28/2016 1029   ALKPHOS 94 11/04/2018 1351   ALKPHOS 78 10/28/2016 1029   BILITOT 0.7 11/04/2018 1351   BILITOT 0.69 10/28/2016 1029   GFRNONAA >60 07/26/2018 1130   GFRNONAA 92 03/08/2018 1317   GFRAA >60 07/26/2018 1130   GFRAA 107 03/08/2018 1317     TB GOLD Quantiferon TB Gold Latest Ref Rng & Units 11/04/2018  Quantiferon TB Gold Plus NEGATIVE NEGATIVE     Assessment/Plan:  She is on Simponi 50 mg every 28 days, methotrexate 0.5 ml every 7 days, and folic acid? 50 mg every 28 days and methotrexate 0.5 ml every 7 days.  Last TB gold negative on 11/04/2018 and will monitor yearly.  Most recent CBC/CMP within normal limits on 11/04/2018 and will monitor every 3 months.  She receives Simponi through [place] patient assistance.  Her application was renewed through 03/01/2020.  All questions encouraged and answered.  Instructed patient to call with any other questions or concerns.  Mariella Saa, PharmD, [place], [place] [facility] Pharmacist [phone]  12/14/2018 10:34 AM

## 2018-12-27 DIAGNOSIS — M25562 Pain in left knee: Secondary | ICD-10-CM | POA: Diagnosis not present

## 2018-12-27 DIAGNOSIS — M25511 Pain in right shoulder: Secondary | ICD-10-CM | POA: Diagnosis not present

## 2018-12-27 DIAGNOSIS — Z4789 Encounter for other orthopedic aftercare: Secondary | ICD-10-CM | POA: Diagnosis not present

## 2018-12-27 DIAGNOSIS — M25561 Pain in right knee: Secondary | ICD-10-CM | POA: Diagnosis not present

## 2018-12-27 DIAGNOSIS — M1712 Unilateral primary osteoarthritis, left knee: Secondary | ICD-10-CM | POA: Diagnosis not present

## 2019-01-03 DIAGNOSIS — M25511 Pain in right shoulder: Secondary | ICD-10-CM | POA: Diagnosis not present

## 2019-01-03 DIAGNOSIS — M25561 Pain in right knee: Secondary | ICD-10-CM | POA: Diagnosis not present

## 2019-01-03 DIAGNOSIS — M25562 Pain in left knee: Secondary | ICD-10-CM | POA: Diagnosis not present

## 2019-01-05 DIAGNOSIS — M25511 Pain in right shoulder: Secondary | ICD-10-CM | POA: Diagnosis not present

## 2019-01-05 DIAGNOSIS — M25562 Pain in left knee: Secondary | ICD-10-CM | POA: Diagnosis not present

## 2019-01-05 DIAGNOSIS — M25561 Pain in right knee: Secondary | ICD-10-CM | POA: Diagnosis not present

## 2019-01-10 DIAGNOSIS — M25511 Pain in right shoulder: Secondary | ICD-10-CM | POA: Diagnosis not present

## 2019-01-10 DIAGNOSIS — M25562 Pain in left knee: Secondary | ICD-10-CM | POA: Diagnosis not present

## 2019-01-10 DIAGNOSIS — M25561 Pain in right knee: Secondary | ICD-10-CM | POA: Diagnosis not present

## 2019-01-17 DIAGNOSIS — M25511 Pain in right shoulder: Secondary | ICD-10-CM | POA: Diagnosis not present

## 2019-01-17 DIAGNOSIS — M25562 Pain in left knee: Secondary | ICD-10-CM | POA: Diagnosis not present

## 2019-01-17 DIAGNOSIS — M25561 Pain in right knee: Secondary | ICD-10-CM | POA: Diagnosis not present

## 2019-01-19 DIAGNOSIS — M25511 Pain in right shoulder: Secondary | ICD-10-CM | POA: Diagnosis not present

## 2019-01-19 DIAGNOSIS — M25562 Pain in left knee: Secondary | ICD-10-CM | POA: Diagnosis not present

## 2019-01-19 DIAGNOSIS — M25561 Pain in right knee: Secondary | ICD-10-CM | POA: Diagnosis not present

## 2019-01-24 DIAGNOSIS — M25511 Pain in right shoulder: Secondary | ICD-10-CM | POA: Diagnosis not present

## 2019-01-24 DIAGNOSIS — M25562 Pain in left knee: Secondary | ICD-10-CM | POA: Diagnosis not present

## 2019-01-24 DIAGNOSIS — M25561 Pain in right knee: Secondary | ICD-10-CM | POA: Diagnosis not present

## 2019-01-31 DIAGNOSIS — M25562 Pain in left knee: Secondary | ICD-10-CM | POA: Diagnosis not present

## 2019-01-31 DIAGNOSIS — M25561 Pain in right knee: Secondary | ICD-10-CM | POA: Diagnosis not present

## 2019-01-31 DIAGNOSIS — M25511 Pain in right shoulder: Secondary | ICD-10-CM | POA: Diagnosis not present

## 2019-02-03 DIAGNOSIS — M25561 Pain in right knee: Secondary | ICD-10-CM | POA: Diagnosis not present

## 2019-02-03 DIAGNOSIS — M25562 Pain in left knee: Secondary | ICD-10-CM | POA: Diagnosis not present

## 2019-02-03 DIAGNOSIS — M25511 Pain in right shoulder: Secondary | ICD-10-CM | POA: Diagnosis not present

## 2019-02-07 ENCOUNTER — Telehealth: Payer: Self-pay | Admitting: Rheumatology

## 2019-02-07 DIAGNOSIS — M25562 Pain in left knee: Secondary | ICD-10-CM | POA: Diagnosis not present

## 2019-02-07 DIAGNOSIS — Z79899 Other long term (current) drug therapy: Secondary | ICD-10-CM

## 2019-02-07 DIAGNOSIS — M25511 Pain in right shoulder: Secondary | ICD-10-CM | POA: Diagnosis not present

## 2019-02-07 DIAGNOSIS — M25561 Pain in right knee: Secondary | ICD-10-CM | POA: Diagnosis not present

## 2019-02-07 NOTE — Telephone Encounter (Signed)
Please send lab orders to Dr. Velora Mediate office, patient's GP for lab draw.

## 2019-02-07 NOTE — Telephone Encounter (Signed)
Lab orders have been released and faxed to Dr. Velora Mediate office.

## 2019-02-08 ENCOUNTER — Telehealth: Payer: Self-pay | Admitting: Rheumatology

## 2019-02-08 DIAGNOSIS — Z862 Personal history of diseases of the blood and blood-forming organs and certain disorders involving the immune mechanism: Secondary | ICD-10-CM

## 2019-02-08 DIAGNOSIS — Z79899 Other long term (current) drug therapy: Secondary | ICD-10-CM

## 2019-02-08 NOTE — Telephone Encounter (Signed)
Ok to add ferritin to upcoming lab orders

## 2019-02-08 NOTE — Telephone Encounter (Signed)
Lab orders released for [facility], per patient's request.  

## 2019-02-08 NOTE — Telephone Encounter (Signed)
Patient left a message requesting a call back to discuss lab orders. Patient is requesting labs to be released to [facility] on [address]. Patient also wants order for Ceretonin to be sent also. Please call to advise.

## 2019-02-08 NOTE — Telephone Encounter (Signed)
We have been sharing the lab  Duties for years  So I am ok  To review orders  And ok them  From dr August Luz   With proper coding  For this particular patient   Please  Help arrange

## 2019-02-08 NOTE — Telephone Encounter (Signed)
Spoke with patient and she is questing to add a ferratin level to her lab orders. Okay to add lab?

## 2019-02-09 DIAGNOSIS — M25562 Pain in left knee: Secondary | ICD-10-CM | POA: Diagnosis not present

## 2019-02-09 DIAGNOSIS — M25511 Pain in right shoulder: Secondary | ICD-10-CM | POA: Diagnosis not present

## 2019-02-09 DIAGNOSIS — Z862 Personal history of diseases of the blood and blood-forming organs and certain disorders involving the immune mechanism: Secondary | ICD-10-CM | POA: Diagnosis not present

## 2019-02-09 DIAGNOSIS — M25561 Pain in right knee: Secondary | ICD-10-CM | POA: Diagnosis not present

## 2019-02-09 DIAGNOSIS — Z79899 Other long term (current) drug therapy: Secondary | ICD-10-CM | POA: Diagnosis not present

## 2019-02-10 LAB — CMP14+EGFR
ALT: 23 IU/L (ref 0–32)
AST: 23 IU/L (ref 0–40)
Albumin/Globulin Ratio: 1.6 (ref 1.2–2.2)
Albumin: 4.5 g/dL (ref 3.8–4.8)
Alkaline Phosphatase: 106 IU/L (ref 39–117)
BUN/Creatinine Ratio: 17 (ref 12–28)
BUN: 15 mg/dL (ref 8–27)
Bilirubin Total: 0.6 mg/dL (ref 0.0–1.2)
CO2: 25 mmol/L (ref 20–29)
Calcium: 10.1 mg/dL (ref 8.7–10.3)
Chloride: 103 mmol/L (ref 96–106)
Creatinine, Ser: 0.86 mg/dL (ref 0.57–1.00)
GFR calc Af Amer: 81 mL/min/{1.73_m2} (ref >59)
GFR calc non Af Amer: 71 mL/min/{1.73_m2} (ref >59)
Globulin, Total: 2.8 g/dL (ref 1.5–4.5)
Glucose: 83 mg/dL (ref 65–99)
Potassium: 4.5 mmol/L (ref 3.5–5.2)
Sodium: 142 mmol/L (ref 134–144)
Total Protein: 7.3 g/dL (ref 6.0–8.5)

## 2019-02-10 LAB — CBC WITH DIFFERENTIAL/PLATELET
Basophils Absolute: 0.1 10*3/uL (ref 0.0–0.2)
Basos: 1 %
EOS (ABSOLUTE): 0.1 10*3/uL (ref 0.0–0.4)
Eos: 1 %
Hematocrit: 39.7 % (ref 34.0–46.6)
Hemoglobin: 13.6 g/dL (ref 11.1–15.9)
Immature Grans (Abs): 0 10*3/uL (ref 0.0–0.1)
Immature Granulocytes: 1 %
Lymphocytes Absolute: 1.6 10*3/uL (ref 0.7–3.1)
Lymphs: 26 %
MCH: 32.9 pg (ref 26.6–33.0)
MCHC: 34.3 g/dL (ref 31.5–35.7)
MCV: 96 fL (ref 79–97)
Monocytes Absolute: 0.6 10*3/uL (ref 0.1–0.9)
Monocytes: 9 %
Neutrophils Absolute: 3.8 10*3/uL (ref 1.4–7.0)
Neutrophils: 62 %
Platelets: 359 10*3/uL (ref 150–450)
RBC: 4.14 x10E6/uL (ref 3.77–5.28)
RDW: 14.1 % (ref 11.7–15.4)
WBC: 6.1 10*3/uL (ref 3.4–10.8)

## 2019-02-10 LAB — IRON,TIBC AND FERRITIN PANEL
Ferritin: 73 ng/mL (ref 15–150)
Iron Saturation: 19 % (ref 15–55)
Iron: 66 ug/dL (ref 27–139)
Total Iron Binding Capacity: 356 ug/dL (ref 250–450)
UIBC: 290 ug/dL (ref 118–369)

## 2019-02-10 NOTE — Telephone Encounter (Signed)
All the labs are within normal limits.  Please notify patient.

## 2019-02-13 ENCOUNTER — Telehealth: Payer: Self-pay | Admitting: Rheumatology

## 2019-02-13 MED ORDER — "BD ALLERGY SYRINGE 28G X 1/2"" 1 ML MISC": 4 refills | Status: DC

## 2019-02-13 NOTE — Telephone Encounter (Signed)
Last Visit:12/14/2018  Next Visit: 03/16/2019  Okay to refill per Dr. Estanislado Pandy.

## 2019-02-13 NOTE — Telephone Encounter (Signed)
Patient left a voicemail requesting prescription refill for syringes to be sent to [facility] at [address].  Patient states the pharmacy has her prescription refill of Methotrexate and is requesting the syringes be electronically sent so she can pick the prescriptions up together.

## 2019-02-15 ENCOUNTER — Other Ambulatory Visit: Payer: Self-pay | Admitting: Rheumatology

## 2019-02-15 NOTE — Telephone Encounter (Signed)
Last Visit: 12/14/2018 Next Visit: 03/16/2019 Labs: 02/09/2019 All the labs are within normal limits. TB Gold: 11/04/2018 negative   Okay to refill per Dr. Estanislado Pandy.

## 2019-02-16 ENCOUNTER — Telehealth: Payer: Self-pay | Admitting: *Deleted

## 2019-02-16 NOTE — Telephone Encounter (Signed)
Patient called. States she had lab work done at Dr. Estanislado Pandy office recently and Ferritin was 85. Although it is in normal range, she remembered that Dr. Irene Limbo wanted to keep in over 200 due to her arthritis. She is very tired and wanted to know if she should come in for an iron infusion. Dr. Irene Limbo informed. Dr. Irene Limbo response: He reviewed current lab results. All WNL. She was last seen in September 2918. Please advise patient to follow up with PCP first for evaluation to determine need for IV iron. Ferritin is WNL at this time. If PCP recommends she return to Dr. Irene Limbo, ask PCP to contact office with concerns.  Contacted patient with Dr. Irene Limbo response. She verbalized understanding - states she just wanted him to know, stating she has concerns about coming to MD appts due to current pandemic at this time. She will have lab work repeated in 3 months with Dr. Estanislado Pandy and will ask Dr. Estanislado Pandy to send them to Dr. Irene Limbo for review to see if any further changes. She will also contact office. Advised her if fatigue continues, she should contact PCP/Dr. Estanislado Pandy for evaluation as fatigue can have many causes.  She verbalized understanding.

## 2019-03-16 ENCOUNTER — Ambulatory Visit: Payer: Medicare Other | Admitting: Rheumatology

## 2019-03-27 ENCOUNTER — Encounter: Payer: Self-pay | Admitting: Rheumatology

## 2019-03-31 ENCOUNTER — Ambulatory Visit: Payer: Medicare Other

## 2019-04-08 ENCOUNTER — Ambulatory Visit: Payer: Medicare Other | Attending: Internal Medicine

## 2019-04-08 DIAGNOSIS — Z23 Encounter for immunization: Secondary | ICD-10-CM | POA: Insufficient documentation

## 2019-04-08 NOTE — Progress Notes (Signed)
   Covid-19 Vaccination Clinic  Name:  Jade Cardenas    MRN: [id number] DOB: 05-27-1950  04/08/2019  Jade Cardenas was observed post Covid-19 immunization for 15 minutes without incidence. She was provided with Vaccine Information Sheet and instruction to access the V-Safe system.   Jade Cardenas was instructed to call 911 with any severe reactions post vaccine: Marland Kitchen Difficulty breathing  . Swelling of your face and throat  . A fast heartbeat  . A bad rash all over your body  . Dizziness and weakness    Immunizations Administered    Name Date Dose VIS Date Route   Pfizer COVID-19 Vaccine 04/08/2019  4:47 PM 0.3 mL 02/10/2019 Intramuscular   Manufacturer: [place]   Lot: [id number]   [place]: [id number]

## 2019-04-21 ENCOUNTER — Ambulatory Visit: Payer: Medicare Other

## 2019-04-26 ENCOUNTER — Other Ambulatory Visit: Payer: Self-pay | Admitting: Internal Medicine

## 2019-04-26 NOTE — Progress Notes (Signed)
   Virtual Visit via Telephone Note  I connected with@ on 04/27/19 at 10:00 AM EST by telephone and verified that I am speaking with the correct person using two identifiers.   I discussed the limitations, risks, security and privacy concerns of performing an evaluation and management service by telephone and the limited availability of in person appointments. tThere may be a patient responsible charge related to this service. The patient expressed understanding and agreed to proceed. Cannot do video internet is poor quality  Where she lives Location patient: home Location provider: work  office Participants present for the call: patient, provider Patient did not have a visit in the prior 7 days to address this/these issue(s).   History of Present Illness: Jade Cardenas  Presents with weeks of off and on unexplained ruq near rib cage pain stinging  And wax wane for about 20 minutes wihtout ass nvd  But seems to be after earting larger meals  .  No injuy mild sx dec with flexeril.    Inc  ppi to nexium bid tin case gastric ulcer .  But not the same . Gets tored easiu from iron deficieny   To see dr Jade Cardenas next month   Had gb out over 20 year ago  Second covid tomorrow  No se x sore arm with first .   Observations/Objective: Patient sounds personable and well on the phone. I do not appreciate any SOB. Speech and thought processing are grossly intact. Patient reported vitals: Lab Results  Component Value Date   WBC 6.1 02/09/2019   HGB 13.6 02/09/2019   HCT 39.7 02/09/2019   PLT 359 02/09/2019   GLUCOSE 83 02/09/2019   CHOL 200 11/26/2017   TRIG 163.0 (H) 11/26/2017   HDL 58.20 11/26/2017   LDLDIRECT 114.0 04/02/2010   LDLCALC 109 (H) 11/26/2017   ALT 23 02/09/2019   AST 23 02/09/2019   NA 142 02/09/2019   K 4.5 02/09/2019   CL 103 02/09/2019   CREATININE 0.86 02/09/2019   BUN 15 02/09/2019   CO2 25 02/09/2019   TSH 1.27 11/26/2017   INR 0.96 06/16/2017   HGBA1C  5.6 11/04/2018    Assessment and Plan:  Atypical  Post prandial not severe and acts otherwise MS  As better with getting upright and some releif  with flexeril    Not cw  w pud as described    Plan abd [place]   Gets reg blood monitoring and no alarming findings last levels   Dr Jade Cardenas is her  Gi specialist.   Follow Up Instructions:  [id number] [id number] [id number] 11-20 [id number] 21-30 I did not refer this patient for an OV in the next 24 hours for this/these issue(s).  I discussed the assessment and treatment plan with the patient. The patient was provided an opportunity to ask questions and answered. The patient agreed with the plan and demonstrated an understanding of the instructions.   The patient was advised to call back or seek an in-person evaluation if the symptoms worsen or if the condition fails to improve as anticipated.  I provided 21  minutes of non-face-to-face time during this encounter. Return for abd [place] and as indicated .  Shanon Ace, MD

## 2019-04-27 ENCOUNTER — Encounter: Payer: Self-pay | Admitting: Internal Medicine

## 2019-04-27 ENCOUNTER — Telehealth (INDEPENDENT_AMBULATORY_CARE_PROVIDER_SITE_OTHER): Payer: Medicare Other | Admitting: Internal Medicine

## 2019-04-27 ENCOUNTER — Other Ambulatory Visit: Payer: Self-pay

## 2019-04-27 DIAGNOSIS — R1011 Right upper quadrant pain: Secondary | ICD-10-CM

## 2019-04-27 DIAGNOSIS — Z8719 Personal history of other diseases of the digestive system: Secondary | ICD-10-CM

## 2019-04-27 DIAGNOSIS — Z9049 Acquired absence of other specified parts of digestive tract: Secondary | ICD-10-CM

## 2019-04-27 DIAGNOSIS — Z79899 Other long term (current) drug therapy: Secondary | ICD-10-CM

## 2019-04-27 DIAGNOSIS — Z8711 Personal history of peptic ulcer disease: Secondary | ICD-10-CM

## 2019-05-02 ENCOUNTER — Other Ambulatory Visit: Payer: Self-pay | Admitting: Rheumatology

## 2019-05-02 ENCOUNTER — Ambulatory Visit
Admission: RE | Admit: 2019-05-02 | Discharge: 2019-05-02 | Disposition: A | Discharge status: Home / Self Care | Payer: Medicare Other | Source: Ambulatory Visit | Attending: Internal Medicine | Admitting: Internal Medicine

## 2019-05-02 DIAGNOSIS — Z79899 Other long term (current) drug therapy: Secondary | ICD-10-CM

## 2019-05-02 DIAGNOSIS — Z9049 Acquired absence of other specified parts of digestive tract: Secondary | ICD-10-CM

## 2019-05-02 DIAGNOSIS — N281 Cyst of kidney, acquired: Secondary | ICD-10-CM | POA: Diagnosis not present

## 2019-05-02 DIAGNOSIS — Z8711 Personal history of peptic ulcer disease: Secondary | ICD-10-CM

## 2019-05-02 DIAGNOSIS — R1011 Right upper quadrant pain: Secondary | ICD-10-CM

## 2019-05-02 DIAGNOSIS — Z8719 Personal history of other diseases of the digestive system: Secondary | ICD-10-CM

## 2019-05-02 NOTE — Progress Notes (Signed)
So no  liver abnormality of importance of  findings tha would explain the problem  do not think th renal cyst   a common findings is causing the problem .

## 2019-05-02 NOTE — Progress Notes (Signed)
Office Visit Note  Patient: Jade Cardenas             Date of Birth: 17-Jul-1950           MRN: [id number]             PCP: [name], MD Referring: [name], MD Visit Date: 05/09/2019 Occupation: @GUAROCC @  Subjective:  Medical management.   History of Present Illness: Jade Cardenas is a 69 y.o. female with history of psoriatic arthritis, psoriasis, osteoarthritis and degenerative disc disease.  She states her arthritis and psoriasis is very well controlled with Simponi Aria.  She has reduced her methotrexate to 0.3 mL subcu weekly.  She has not noticed any flares on reduced dose of methotrexate.  She states she has been having right-sided discomfort for the last several weeks.  She contacted her PCP and had ultra abdominal ultrasound which was negative.  She states her symptoms resolved for some time and then she had recurrence of symptoms last night.  She states her symptoms get worse 20 minutes after eating.  Activities of Daily Living:  Patient reports morning stiffness for several hours.   Patient Reports nocturnal pain.  Difficulty dressing/grooming: Denies Difficulty climbing stairs: Reports Difficulty getting out of chair: Reports Difficulty using hands for taps, buttons, cutlery, and/or writing: Reports  Review of Systems  Constitutional: Positive for fatigue. Negative for night sweats, weight gain and weight loss.  HENT: Positive for mouth dryness. Negative for mouth sores, trouble swallowing, trouble swallowing and nose dryness.   Eyes: Positive for dryness. Negative for pain, redness and visual disturbance.  Respiratory: Positive for shortness of breath. Negative for cough and difficulty breathing.   Cardiovascular: Negative for chest pain, palpitations, hypertension, irregular heartbeat and swelling in legs/feet.  Gastrointestinal: Negative for blood in stool, constipation and diarrhea.  Endocrine: Negative for increased urination.  Genitourinary:  Negative for difficulty urinating, painful urination and vaginal dryness.  Musculoskeletal: Positive for arthralgias, joint pain and morning stiffness. Negative for joint swelling, myalgias, muscle weakness, muscle tenderness and myalgias.  Skin: Negative for color change, rash, hair loss, skin tightness, ulcers and sensitivity to sunlight.  Allergic/Immunologic: Negative for susceptible to infections.  Neurological: Positive for weakness. Negative for dizziness, numbness, headaches, memory loss and night sweats.  Hematological: Positive for bruising/bleeding tendency. Negative for swollen glands.  Psychiatric/Behavioral: Negative for depressed mood, confusion and sleep disturbance. The patient is not nervous/anxious.     PMFS History:  Patient Active Problem List   Diagnosis Date Noted  . History of total knee replacement, left 06/28/2017  . OA (osteoarthritis) of knee 06/21/2017  . Varicose veins of bilateral lower extremities with other complications [id number]  . DDD (degenerative disc disease), lumbar 10/19/2016  . Goiter 08/28/2016  . Family history of malignant neoplasm of digestive organ 08/27/2016  . 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 Fibromyalgia 05/17/2016  . High risk medication use 02/18/2016  . DJD (degenerative joint disease), cervical 02/18/2016  . Heel spur 09/29/2015  . Ingrown toenail 09/29/2015  . Foraminal stenosis of cervical region 07/30/2015  . Microcytic hypochromic anemia 05/22/2015  . Tailor's bunion, left 01/18/2015  . H/O hiatal hernia   . Bilateral primary osteoarthritis of knee 12/15/2013  . Iron deficiency 12/15/2013  . Psoriatic arthritis ([place]) 12/15/2013  . History of foot fracture 12/15/2013  . Dermatitis 10/17/2013  . Arthritis 05/31/2013  . 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 Skin lesion 12/09/2012  . Tinnitus 06/23/2012  .  Scalp lesion 01/21/2012  . Headache(784.0) 01/21/2012  . Hx of psoriasis 01/21/2012  .  Weight gain 01/21/2012  . Ankylosing spondylitis  possible 09/03/2011  . Hx of transfusion 09/03/2011  . S/P lumbar fusion 6 13 09/03/2011  . Lumbar stenosis with neurogenic claudication 08/05/2011  . LBP (low back pain) 01/17/2011  . ALLERGIC RHINITIS 02/19/2010  . GERD 02/19/2010  . ARTHRITIS 02/19/2010  . SPINAL STENOSIS, CERVICAL 02/19/2010  . OSTEOPENIA 02/19/2010  . RASH AND OTHER NONSPECIFIC SKIN ERUPTION 12/07/2007  . IBS 04/28/2007  . History of gastric ulcer 02/09/2007  . COLONIC POLYPS, HX OF 02/09/2007  . CHOLECYSTECTOMY, HX OF 02/09/2007  . Hyperlipidemia 12/29/2006  . Essential hypertension 12/29/2006    Past Medical History:  Diagnosis Date  . Anemia    iron def  . Arthritis    rheumatoid and osteo now flt to be psoriateic   . Arthritis    psoreratic, osteoarthritis  . Bone spur    RIGHT FOOT   . Broken toe   . Bruises easily   . CHOLECYSTECTOMY, HX OF   . Chronic back pain   . COLONIC POLYPS, HX OF 02/09/2007  . Cough, persistent 01/21/2012   poss from acei   . 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 Femur fracture, left ([place])   . Fibromyalgia    doesn't require meds  . GASTRIC ULCER, ACUTE, HEMORRHAGE, HX OF 02/09/2007  . GERD (gastroesophageal reflux disease)    takes Nexium daily  . Goiter   . H/O hiatal hernia   . Headache(784.0)    occasionally  . History of bronchitis   . Hx of seasonal allergies    takes Claritin daily  . HYPERLIPIDEMIA 12/29/2006   takes Lovastatin nightly  . HYPERTENSION 12/29/2006   takes Enalapril daily  . IBS 04/28/2007  . Insomnia    related to pain;takes Flexeril and Tylenol PM nightly  . Joint pain   . Joint swelling   . Palpitations 02/09/2007  . PONV (postoperative nausea and vomiting)    NAUSEA  . REDUCTION MAMMOPLASTY, HX OF 02/09/2007  . Rheumatoid arthritis(714.0) 12/29/2006  . Right bundle branch block 02/09/2007  . S/P lumbar fusion 6 13 09/03/2011   L4 L5  posterior   . SYNCOPE 12/07/2007  . Tendonitis     RLE   . UNS ADVRS EFF OTH RX MEDICINAL\T\BIOLOGICAL SBSTNC 02/09/2007    Family History  Problem Relation Age of Onset  . Diabetes Mother   . Colon cancer Mother        x2  . Arthritis Mother   . Hypertension Mother   . Endometrial cancer Mother   . Stroke Father   . Heart disease Father   . Colon cancer Father   . Arthritis Brother   . Hypertension Brother   . Hyperlipidemia Brother   . Infertility Brother   . Diabetes Brother   . Other Other        DISH brother  . Hypertension Maternal Grandmother   . Stroke Maternal Grandmother   . Anesthesia problems Neg Hx   . Hypotension Neg Hx   . Malignant hyperthermia Neg Hx   . Pseudochol deficiency Neg Hx    Past Surgical History:  Procedure Laterality Date  . BACK SURGERY  12+yrs ago   Synovial Cyst removal  . BACK SURGERY  6/13   lumbar L4-L5 fusion, lamenectomy  . bone spur  6-62yrs ago   right ankle  . BUNIONECTOMY  10/03/14   right foot  .  CERVICAL FUSION     2 12   . CHOLECYSTECTOMY  1996  . HARDWARE REMOVAL Left 08/01/2018   Procedure: Left knee hardware removal;  Surgeon: Gaynelle Arabian, MD;  Location: [facility];  Service: Orthopedics;  Laterality: Left;  52min  . JOINT REPLACEMENT    . KNEE ARTHROPLASTY    . POSTERIOR CERVICAL LAMINECTOMY Left 07/30/2015   Procedure: Laminectomy and Foraminotomy - left - Cervical two -Cervical three;  Surgeon: Earnie Larsson, MD;  Location: [facility];  Service: Neurosurgery;  Laterality: Left;  . REDUCTION MAMMAPLASTY Bilateral 1989  . TONSILLECTOMY     as a child  . TOTAL ABDOMINAL HYSTERECTOMY W/ BILATERAL SALPINGOOPHORECTOMY  1986  . 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 Fluad Quad(high Dose 65+) 11/04/2018  . Influenza Split 11/29/2012, 11/27/2013  . Influenza Whole 11/09/2008  . Influenza, High Dose Seasonal PF 11/26/2015, 11/30/2016, 11/03/2017  . Influenza,inj,Quad PF,6+ Mos 11/27/2014  . Influenza-Unspecified 12/22/2016  . PFIZER SARS-COV-2 Vaccination 04/08/2019, 05/03/2019  . PPD Test 12/18/2013, 11/27/2014, 11/26/2015, 11/30/2016  . Pneumococcal Conjugate-13 02/28/2013  . Pneumococcal Polysaccharide-23 03/03/2003, 02/28/2014  . Td 05/09/2008  . Tdap 07/19/2018  . Zoster 01/20/2011  . Zoster Recombinat (Shingrix) 12/03/2016, 02/04/2017     Objective: Vital Signs: BP (!) 150/80 (BP Location: Left Arm, Patient Position: Sitting, Cuff Size: Normal)   Pulse 78   Resp 14   Ht 5\' 4"  (1.626 m)   Wt 219 lb 6.4 oz (99.5 kg)   LMP 03/02/1984   BMI 37.66 kg/m    Physical Exam Vitals and nursing note reviewed.  Constitutional:      Appearance: She is well-developed.  HENT:     Head: Normocephalic and atraumatic.  Eyes:     Conjunctiva/sclera: Conjunctivae normal.  Cardiovascular:     Rate and Rhythm: Normal rate and regular rhythm.     Heart sounds: Normal heart sounds.  Pulmonary:     Effort: Pulmonary effort is normal.     Breath sounds: Normal breath sounds.  Abdominal:     General: Bowel sounds are normal.     Palpations: Abdomen is soft.  Musculoskeletal:     Cervical back: Normal range of motion.  Lymphadenopathy:     Cervical: No cervical adenopathy.  Skin:    General: Skin is warm and dry.     Capillary Refill: Capillary refill takes less than 2 seconds.  Neurological:     Mental Status: She is alert and oriented to person, place, and time.  Psychiatric:        Behavior: Behavior normal.      Musculoskeletal Exam: C-spine was in good range of motion.  She has thoracic kyphosis.  Shoulder joints, elbow joints, wrist  joints with good range of motion.  She has no MCP or PIP swelling.  Hip joints with good range of motion.  She has discomfort in her left knee joint was replaced.  Right knee joint was in good range of motion.  No MTP tenderness was noted.  CDAI Exam: CDAI Score: - Patient Global: -; Provider Global: - Swollen: -;  Tender: - Joint Exam 05/09/2019   No joint exam has been documented for this visit   There is currently no information documented on the homunculus. Go to the Rheumatology activity and complete the homunculus joint exam.  Investigation: No additional findings.  Imaging: US Abdomen Complete  Result Date: 05/02/2019 CLINICAL DATA:  Intermittent postprandial abdominal pain. EXAM: ABDOMEN ULTRASOUND COMPLETE COMPARISON:  None. FINDINGS: Gallbladder: Surgically absent. Common bile duct: Diameter: 7.2 mm Liver: Normal echogenicity without focal lesion or biliary dilatation. Portal vein is patent on color Doppler imaging with normal direction of blood flow towards the liver. IVC: Normal caliber. Pancreas: Poorly visualized due to overlying bowel gas and poor sonographic window. Spleen: Normal size.  No focal lesions. Right Kidney: Length: Right 10.4 cm. Normal renal cortical thickness and echogenicity without hydronephrosis or worrisome renal lesion. There is a 4.0 x 3.7 x 4.2 cm upper pole cyst noted. Left Kidney: Length: 10.6 cm. Normal renal cortical thickness and echogenicity without focal lesions or hydronephrosis. Abdominal aorta: Normal caliber Other findings: None. IMPRESSION: 1. Status post cholecystectomy.  No biliary dilatation. 2. Unremarkable sonographic appearance of the liver, spleen and both kidneys. Simple right renal cyst is noted. 3. Poorly visualized pancreas. Electronically Signed   By: Marijo Sanes M.D.   On: 05/02/2019 14:18    Recent Labs: Lab Results  Component Value Date   WBC 6.1 02/09/2019   HGB 13.6 02/09/2019   PLT 359 02/09/2019   NA 142 02/09/2019   K  4.5 02/09/2019   CL 103 02/09/2019   CO2 25 02/09/2019   GLUCOSE 83 02/09/2019   BUN 15 02/09/2019   CREATININE 0.86 02/09/2019   BILITOT 0.6 02/09/2019   ALKPHOS 106 02/09/2019   AST 23 02/09/2019   ALT 23 02/09/2019   PROT 7.3 02/09/2019   ALBUMIN 4.5 02/09/2019   CALCIUM 10.1 02/09/2019   GFRAA 81 02/09/2019   QFTBGOLDPLUS NEGATIVE 11/04/2018    Speciality Comments: Failed therapy: Arava and Otezla due to diarrhea  Procedures:  No procedures performed Allergies: Erythromycin, Iodinated diagnostic agents, Ace inhibitors, Keflex [cephalexin], and Macrobid [nitrofurantoin]   Assessment / Plan:     Visit Diagnoses: Psoriatic arthritis (HCC)-she has been doing well on methotrexate and Simponi combination.  She had no synovitis on examination today.  She has reduced her methotrexate dose.  High risk medication use - Simponi 50 mg every 28 days and methotrexate 0.3 ml every 7 days.Jolee Ewing and [place] caused diarrhea).  - Plan: CBC with Differential/Platelet, COMPLETE METABOLIC PANEL WITH GFR today and then every 3 months.  Hx of psoriasis-she has no active lesions.  Primary osteoarthritis of right knee - moderate osteoarthritis and severe chondromalacia patella.  Total knee replacement status, left - Dr. Wynelle Link: She continues to have chronic pain.  DDD (degenerative disc disease), cervical - S/P fusion  DDD (degenerative disc disease), lumbar - S/P fusion  Fibromyalgia-she complains of some right-sided rib cage pain.  Stretching exercises were demonstrated.  She also believes the pain gets worse after having meals.  Have advised her to schedule an appointment Dr. [name].  Chronic fatigue-she has been experiencing increased fatigue.  She states she has discussed this with Dr.Kale.  He wants her to have iron studies.  We will order those today.  Osteopenia of multiple sites - DEXA 09/15/17 osteopenia T -1.8.  Followed by her GYN.  Other iron deficiency anemia - Plan: Iron, TIBC  and Ferritin Panel  Orders: Orders Placed This Encounter  Procedures  . CBC with Differential/Platelet  .  COMPLETE METABOLIC PANEL WITH GFR  . Iron, TIBC and Ferritin Panel   No orders of the defined types were placed in this encounter.    Follow-Up Instructions: Return in about 5 months (around 10/09/2019) for Psoriatic arthritis, Osteoarthritis.   Bo Merino, MD  Note - This record has been created using Editor, commissioning.  Chart creation errors have been sought, but may not always  have been located. Such creation errors do not reflect on  the standard of medical care.

## 2019-05-02 NOTE — Telephone Encounter (Signed)
Last Visit: 12/14/2018 Next Visit: 05/09/2019 Labs: 02/09/2019 All the labs are within normal limits. TB Gold: 11/04/2018 negative   Okay to refill per Dr. Estanislado Pandy

## 2019-05-03 ENCOUNTER — Ambulatory Visit: Payer: Medicare Other | Attending: Internal Medicine

## 2019-05-03 DIAGNOSIS — Z23 Encounter for immunization: Secondary | ICD-10-CM | POA: Insufficient documentation

## 2019-05-03 NOTE — Progress Notes (Signed)
   Covid-19 Vaccination Clinic  Name:  Jade Cardenas    MRN: [id number] DOB: [birth year]  05/03/2019  Jade Cardenas was observed post Covid-19 immunization for 15 minutes without incident. She was provided with Vaccine Information Sheet and instruction to access the V-Safe system.   Jade Cardenas was instructed to call 911 with any severe reactions post vaccine: Marland Kitchen Difficulty breathing  . Swelling of face and throat  . A fast heartbeat  . A bad rash all over body  . Dizziness and weakness   Immunizations Administered    Name Date Dose VIS Date Route   Pfizer COVID-19 Vaccine 05/03/2019  1:01 PM 0.3 mL 02/10/2019 Intramuscular   Manufacturer: [place]   Lot: [id number]   Seth Ward: [id number]

## 2019-05-08 ENCOUNTER — Other Ambulatory Visit: Payer: Self-pay | Admitting: Rheumatology

## 2019-05-08 NOTE — Telephone Encounter (Signed)
Last Visit:12/14/2018 Next Visit:05/09/2019 Labs:12/10/2020All the labs are within normal limits. TB Gold:11/04/2018 negative  Okay to refillper Dr. Estanislado Pandy

## 2019-05-09 ENCOUNTER — Encounter: Payer: Self-pay | Admitting: Rheumatology

## 2019-05-09 ENCOUNTER — Ambulatory Visit (INDEPENDENT_AMBULATORY_CARE_PROVIDER_SITE_OTHER): Payer: Medicare Other | Admitting: Rheumatology

## 2019-05-09 ENCOUNTER — Other Ambulatory Visit: Payer: Self-pay

## 2019-05-09 VITALS — BP 150/80 | HR 78 | Resp 14 | Ht 64.0 in | Wt 219.4 lb

## 2019-05-09 DIAGNOSIS — Z96652 Presence of left artificial knee joint: Secondary | ICD-10-CM

## 2019-05-09 DIAGNOSIS — M1711 Unilateral primary osteoarthritis, right knee: Secondary | ICD-10-CM

## 2019-05-09 DIAGNOSIS — M8589 Other specified disorders of bone density and structure, multiple sites: Secondary | ICD-10-CM | POA: Diagnosis not present

## 2019-05-09 DIAGNOSIS — M797 Fibromyalgia: Secondary | ICD-10-CM

## 2019-05-09 DIAGNOSIS — L4050 Arthropathic psoriasis, unspecified: Secondary | ICD-10-CM | POA: Diagnosis not present

## 2019-05-09 DIAGNOSIS — Z872 Personal history of diseases of the skin and subcutaneous tissue: Secondary | ICD-10-CM | POA: Diagnosis not present

## 2019-05-09 DIAGNOSIS — M5136 Other intervertebral disc degeneration, lumbar region: Secondary | ICD-10-CM

## 2019-05-09 DIAGNOSIS — D508 Other iron deficiency anemias: Secondary | ICD-10-CM | POA: Diagnosis not present

## 2019-05-09 DIAGNOSIS — R5382 Chronic fatigue, unspecified: Secondary | ICD-10-CM | POA: Diagnosis not present

## 2019-05-09 DIAGNOSIS — Z79899 Other long term (current) drug therapy: Secondary | ICD-10-CM

## 2019-05-09 DIAGNOSIS — M5030 Other cervical disc degeneration, unspecified cervical region: Secondary | ICD-10-CM

## 2019-05-09 LAB — IRON,TIBC AND FERRITIN PANEL
%SAT: 20 % (calc) (ref 16–45)
Ferritin: 58 ng/mL (ref 16–288)
Iron: 74 ug/dL (ref 45–160)
TIBC: 366 mcg/dL (calc) (ref 250–450)

## 2019-05-09 LAB — CBC WITH DIFFERENTIAL/PLATELET
Absolute Monocytes: 254 cells/uL (ref 200–950)
Basophils Absolute: 53 cells/uL (ref 0–200)
Basophils Relative: 1 %
Eosinophils Absolute: 154 cells/uL (ref 15–500)
Eosinophils Relative: 2.9 %
HCT: 39.5 % (ref 35.0–45.0)
Hemoglobin: 13.8 g/dL (ref 11.7–15.5)
Lymphs Abs: 1463 cells/uL (ref 850–3900)
MCH: 32.3 pg (ref 27.0–33.0)
MCHC: 34.9 g/dL (ref 32.0–36.0)
MCV: 92.5 fL (ref 80.0–100.0)
MPV: 10 fL (ref 7.5–12.5)
Monocytes Relative: 4.8 %
Neutro Abs: 3376 cells/uL (ref 1500–7800)
Neutrophils Relative %: 63.7 %
Platelets: 330 10*3/uL (ref 140–400)
RBC: 4.27 10*6/uL (ref 3.80–5.10)
RDW: 13.8 % (ref 11.0–15.0)
Total Lymphocyte: 27.6 %
WBC: 5.3 10*3/uL (ref 3.8–10.8)

## 2019-05-09 LAB — COMPLETE METABOLIC PANEL WITH GFR
AG Ratio: 1.5 (calc) (ref 1.0–2.5)
ALT: 22 U/L (ref 6–29)
AST: 21 U/L (ref 10–35)
Albumin: 4.5 g/dL (ref 3.6–5.1)
Alkaline phosphatase (APISO): 104 U/L (ref 37–153)
BUN: 16 mg/dL (ref 7–25)
CO2: 29 mmol/L (ref 20–32)
Calcium: 10.1 mg/dL (ref 8.6–10.4)
Chloride: 102 mmol/L (ref 98–110)
Creat: 0.75 mg/dL (ref 0.50–0.99)
GFR, Est African American: 96 mL/min/{1.73_m2} (ref >=60)
GFR, Est Non African American: 83 mL/min/{1.73_m2} (ref >=60)
Globulin: 3.1 g/dL (calc) (ref 1.9–3.7)
Glucose, Bld: 96 mg/dL (ref 65–99)
Potassium: 4.4 mmol/L (ref 3.5–5.3)
Sodium: 139 mmol/L (ref 135–146)
Total Bilirubin: 0.7 mg/dL (ref 0.2–1.2)
Total Protein: 7.6 g/dL (ref 6.1–8.1)

## 2019-05-09 NOTE — Patient Instructions (Signed)
Standing Labs We placed an order today for your standing lab work.    Please come back and get your standing labs in June and every  months  We have open lab daily Monday through Thursday from 8:30-12:30 PM and 1:30-4:30 PM and Friday from 8:30-12:30 PM and 1:30-4:00 PM at the office of Dr. Bo Merino.   You may experience shorter wait times on Monday and Friday afternoons. The office is located at [address], [place], [place], [place] [address] No appointment is necessary.   Labs are drawn by [facility].  You may receive a bill from [facility] for your lab work.  If you wish to have your labs drawn at another location, please call the office 24 hours in advance to send orders.  If you have any questions regarding directions or hours of operation,  please call [phone].   Just as a reminder please drink plenty of water prior to coming for your lab work. Thanks!

## 2019-05-10 NOTE — Progress Notes (Signed)
All the labs are within normal limits.

## 2019-05-16 ENCOUNTER — Telehealth: Payer: Self-pay | Admitting: Hematology

## 2019-05-16 NOTE — Telephone Encounter (Signed)
Scheduled per staff msg. Called and spoke with pt, confirmed 3/29 appt

## 2019-05-29 ENCOUNTER — Other Ambulatory Visit: Payer: Self-pay

## 2019-05-29 ENCOUNTER — Inpatient Hospital Stay: Payer: Medicare Other

## 2019-05-29 ENCOUNTER — Telehealth: Payer: Self-pay | Admitting: Hematology

## 2019-05-29 ENCOUNTER — Inpatient Hospital Stay: Payer: Medicare Other | Attending: Hematology | Admitting: Hematology

## 2019-05-29 VITALS — BP 177/71 | HR 83 | Temp 97.8°F | Resp 18 | Ht 64.0 in | Wt 220.6 lb

## 2019-05-29 DIAGNOSIS — R109 Unspecified abdominal pain: Secondary | ICD-10-CM | POA: Insufficient documentation

## 2019-05-29 DIAGNOSIS — Z86010 Personal history of colonic polyps: Secondary | ICD-10-CM | POA: Diagnosis not present

## 2019-05-29 DIAGNOSIS — Z8711 Personal history of peptic ulcer disease: Secondary | ICD-10-CM | POA: Diagnosis not present

## 2019-05-29 DIAGNOSIS — N281 Cyst of kidney, acquired: Secondary | ICD-10-CM | POA: Diagnosis not present

## 2019-05-29 DIAGNOSIS — D509 Iron deficiency anemia, unspecified: Secondary | ICD-10-CM | POA: Diagnosis not present

## 2019-05-29 DIAGNOSIS — G35 Multiple sclerosis: Secondary | ICD-10-CM | POA: Insufficient documentation

## 2019-05-29 DIAGNOSIS — Z79899 Other long term (current) drug therapy: Secondary | ICD-10-CM | POA: Diagnosis not present

## 2019-05-29 DIAGNOSIS — M129 Arthropathy, unspecified: Secondary | ICD-10-CM | POA: Diagnosis not present

## 2019-05-29 DIAGNOSIS — M797 Fibromyalgia: Secondary | ICD-10-CM | POA: Diagnosis not present

## 2019-05-29 DIAGNOSIS — Z800 Family history of malignant neoplasm of digestive organs: Secondary | ICD-10-CM | POA: Insufficient documentation

## 2019-05-29 DIAGNOSIS — D649 Anemia, unspecified: Secondary | ICD-10-CM

## 2019-05-29 DIAGNOSIS — E538 Deficiency of other specified B group vitamins: Secondary | ICD-10-CM | POA: Insufficient documentation

## 2019-05-29 DIAGNOSIS — M069 Rheumatoid arthritis, unspecified: Secondary | ICD-10-CM | POA: Diagnosis not present

## 2019-05-29 DIAGNOSIS — K219 Gastro-esophageal reflux disease without esophagitis: Secondary | ICD-10-CM | POA: Insufficient documentation

## 2019-05-29 DIAGNOSIS — M25562 Pain in left knee: Secondary | ICD-10-CM | POA: Diagnosis not present

## 2019-05-29 NOTE — Telephone Encounter (Signed)
Scheduled appt per 3/29 los. Gave pt a print out of AVS.  

## 2019-05-29 NOTE — Progress Notes (Signed)
Jade Cardenas    HEMATOLOGY/ONCOLOGY CLINIC NOTE  Date of Service:  .05/29/2019  Patient Care Team: [name], MD as PCP - General Richmond Campbell, MD (Gastroenterology) Megan Salon, MD (Gynecology) Bo Merino, MD as Consulting Physician (Rheumatology) Brunetta Genera, MD as Consulting Physician (Hematology) Gaynelle Arabian, MD as Consulting Physician (Orthopedic Surgery)  CHIEF COMPLAINTS/PURPOSE OF CONSULTATION: f/u for Iron deficiency anemia  Diagnosis severe iron deficiency anemia + ACD (due to psoariasis) Oral iron intolerance  Treatment IV Iron as needed (received IV feraheme 08/29/15, 08/22/2015, 06/04/2015, 05/28/2015)   INTERVAL HISTORY  Jade Cardenas is here for her scheduled follow-up for iron deficiency anemia. The patient's last visit with [place] was on 11/04/2016. The pt reports that she is doing well overall.  The pt reports that she has retired since our last visit. She also had her left knee replaced, during which, the surgeon fractured her femur and her recovery was very tough. Pt was having some right-sided abdominal pain and got a scan which found a cyst on her kidney, but nothing that would explain her pain. She saw Dr. Estanislado Pandy who thought that this discomfort was musculoskeletal and gave the pt some exercises and stretches to do, which have been helping some. Pt has also had some flare-ups of her IBS. Pt is taking Simponi and Methotrexate as prescribed. Her psoriasis and arthritis have been otherwise stable.   She has been fatigued recently and has been unable to walk as far as she used to. Pt denies any known GI bleeding in the interim.   Pt has received both doses of the COVID19 vaccine and tolerated them well.  Lab results (05/09/19) of CBC w/diff and CMP is as follows: all values are WNL.  05/09/2019 Iron an TIBC is as follows: Iron at 74, TIBC at 366, Sat Ratios at 20, Ferritin at 58.  On review of systems, pt reports left knee pain, right-sided  abdominal pain, fatigue and denies rashes, bloody/black stools, leg swelling and any other symptoms.   MEDICAL HISTORY:  Past Medical History:  Diagnosis Date  . Anemia    iron def  . Arthritis    rheumatoid and osteo now flt to be psoriateic   . Arthritis    psoreratic, osteoarthritis  . Bone spur    RIGHT FOOT   . Broken toe   . Bruises easily   . CHOLECYSTECTOMY, HX OF   . Chronic back pain   . COLONIC POLYPS, HX OF 02/09/2007  . Cough, persistent 01/21/2012   poss from acei   . 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 Femur fracture, left ([place])   . Fibromyalgia    doesn't require meds  . GASTRIC ULCER, ACUTE, HEMORRHAGE, HX OF 02/09/2007  . GERD (gastroesophageal reflux disease)    takes Nexium daily  . Goiter   . H/O hiatal hernia   . Headache(784.0)    occasionally  . History of bronchitis   . Hx of seasonal allergies    takes Claritin daily  . HYPERLIPIDEMIA 12/29/2006   takes Lovastatin nightly  . HYPERTENSION 12/29/2006   takes Enalapril daily  . IBS 04/28/2007  . Insomnia    related to pain;takes Flexeril and Tylenol PM nightly  . Joint pain   . Joint swelling   . Palpitations 02/09/2007  . PONV (postoperative nausea and vomiting)    NAUSEA  . REDUCTION MAMMOPLASTY, HX OF 02/09/2007  . Rheumatoid arthritis(714.0) 12/29/2006  . Right bundle branch block 02/09/2007  . S/P lumbar fusion  6 13 09/03/2011   L4 L5  posterior   . SYNCOPE 12/07/2007  . Tendonitis    RLE   . UNS ADVRS EFF OTH RX MEDICINAL\T\BIOLOGICAL SBSTNC 02/09/2007    SURGICAL HISTORY: Past Surgical History:  Procedure Laterality Date  . BACK SURGERY  12+yrs ago   Synovial Cyst removal  . BACK SURGERY  6/13   lumbar L4-L5 fusion, lamenectomy  . bone spur  6-74yrs ago   right ankle  . BUNIONECTOMY  10/03/14   right foot  . CERVICAL FUSION     2 12   . CHOLECYSTECTOMY  1996  . HARDWARE REMOVAL Left 08/01/2018   Procedure: Left knee hardware removal;  Surgeon: Gaynelle Arabian, MD;  Location:  [facility];  Service: Orthopedics;  Laterality: Left;  8min  . JOINT REPLACEMENT    . KNEE ARTHROPLASTY    . POSTERIOR CERVICAL LAMINECTOMY Left 07/30/2015   Procedure: Laminectomy and Foraminotomy - left - Cervical two -Cervical three;  Surgeon: Earnie Larsson, MD;  Location: [facility];  Service: Neurosurgery;  Laterality: Left;  . REDUCTION MAMMAPLASTY Bilateral 1989  . TONSILLECTOMY     as a child  . TOTAL ABDOMINAL HYSTERECTOMY W/ BILATERAL SALPINGOOPHORECTOMY  1986  . TOTAL KNEE ARTHROPLASTY N/A 06/21/2017   Procedure: LEFT TOTAL KNEE ARTHROPLASTY AND RIGHT KNEE CORTISONE INJECTION;  Surgeon: Gaynelle Arabian, MD;  Location: [facility];  Service: Orthopedics;  Laterality: N/A;    SOCIAL HISTORY: Social History   Socioeconomic History  . Marital status: Widowed    Spouse name: Not on file  . Number of children: Not on file  . Years of education: Not on file  . Highest education level: Not on file  Occupational History  . Not on file  Tobacco Use  . Smoking status: Never Smoker  . Smokeless tobacco: Never Used  Substance and Sexual Activity  . Alcohol use: No    Alcohol/week: 0.0 standard drinks  . Drug use: No  . Sexual activity: Not Currently    Partners: Male    Birth control/protection: Surgical    Comment: TAH/BSO  Other Topics Concern  . 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 Difficulty of Paying Living Expenses:   Food Insecurity:   . Worried About Charity fundraiser in the Last Year:   . Arboriculturist in the Last Year:   Transportation Needs:   . Film/video editor (Medical):   Jade Cardenas Lack of Transportation (Non-Medical):   Physical Activity:   . Days of Exercise per Week:   . Minutes of Exercise per Session:   Stress:   . Feeling of Stress :   Social  Connections:   . Frequency of Communication with Friends and Family:   . Frequency of Social Gatherings with Friends and Family:   . Attends Religious Services:   . Active Member of Clubs or Organizations:   . Attends Archivist Meetings:   Jade Cardenas Marital Status:   Intimate Partner Violence:   . Fear of Current or Ex-Partner:   . Emotionally Abused:   Jade Cardenas Physically Abused:   . Sexually Abused:     FAMILY HISTORY: Family History  Problem Relation Age of Onset  . Diabetes Mother   . Colon cancer Mother  x2  . Arthritis Mother   . Hypertension Mother   . Endometrial cancer Mother   . Stroke Father   . Heart disease Father   . Colon cancer Father   . Arthritis Brother   . Hypertension Brother   . Hyperlipidemia Brother   . Infertility Brother   . Diabetes Brother   . Other Other        DISH brother  . Hypertension Maternal Grandmother   . Stroke Maternal Grandmother   . Anesthesia problems Neg Hx   . Hypotension Neg Hx   . Malignant hyperthermia Neg Hx   . Pseudochol deficiency Neg Hx     ALLERGIES:  is allergic to erythromycin; iodinated diagnostic agents; ace inhibitors; keflex [cephalexin]; and macrobid [nitrofurantoin].  MEDICATIONS:  Current Outpatient Medications  Medication Sig Dispense Refill  . acetaminophen (TYLENOL) 650 MG CR tablet Take 1,300 mg by mouth See admin instructions. Take 1300 mg in the morning and may take an additional 1300 mg dose as needed for pain    . amLODipine (NORVASC) 5 MG tablet TAKE 1 TABLET BY MOUTH EVERY DAY 90 tablet 3  . amoxicillin (AMOXIL) 500 MG capsule Take 2,000 mg by mouth See admin instructions. Take 2000 mg 1 hour prior to dental work    . Calcium Carb-Cholecalciferol (CALCIUM+D3 PO) Take 1 tablet by mouth daily.    . diphenhydramine-acetaminophen (TYLENOL PM) 25-500 MG TABS tablet Take 2 tablets by mouth at bedtime.    Jade Cardenas losartan (COZAAR) 100 MG tablet TAKE 1 TABLET BY MOUTH EVERY DAY 90 tablet 3  . lovastatin  (MEVACOR) 20 MG tablet TAKE 1 TABLET BY MOUTH EVERY DAY IN THE EVENING 90 tablet 3  . methotrexate 50 MG/2ML injection INJECT 0.5 ML (12.5 MG TOTAL) INTO THE SKIN ONCE A WEEK. (Patient taking differently: INJECT 0.3ML INTO THE SKIN ONCE WEEKLY.) 6 mL 0  . Multiple Vitamins-Minerals (MULTIVITAMIN ADULTS PO) Take 1 tablet by mouth daily.     Jade Cardenas omeprazole (PRILOSEC) 40 MG capsule TAKE 1 CAPSULE BY MOUTH EVERY DAY 90 capsule 3  . SIMPONI 50 MG/0.5ML SOAJ INJECT 50MG  (1 PEN) UNDER THE SKIN ONCE MONTHLY 0.5 mL 0  . Tuberculin-Allergy Syringes (B-D ALLERGY SYRINGE 1CC/28G) 28G X 1/2" 1 ML MISC USE AS DIRECTED ONCE WEEKLY 12 each 4   No current facility-administered medications for this visit.    REVIEW OF SYSTEMS:   A 10+ POINT REVIEW OF SYSTEMS WAS OBTAINED including neurology, dermatology, psychiatry, cardiac, respiratory, lymph, extremities, GI, GU, Musculoskeletal, constitutional, breasts, reproductive, HEENT.  All pertinent positives are noted in the HPI.  All others are negative.   PHYSICAL EXAMINATION: ECOG PERFORMANCE STATUS: 2 - Symptomatic, <50% confined to bed  . Vitals:   05/29/19 1425  BP: (!) 177/71  Pulse: 83  Resp: 18  Temp: 97.8 F (36.6 C)  SpO2: 100%   Filed Weights   05/29/19 1425  Weight: 220 lb 9.6 oz (100.1 kg)   .Body mass index is 37.87 kg/m.  Exam was given in a chair   GENERAL:alert, in no acute distress and comfortable SKIN: no acute rashes, no significant lesions EYES: conjunctiva are pink and non-injected, sclera anicteric OROPHARYNX: MMM, no exudates, no oropharyngeal erythema or ulceration NECK: supple, no JVD LYMPH:  no palpable lymphadenopathy in the cervical, axillary or inguinal regions LUNGS: clear to auscultation b/l with normal respiratory effort HEART: regular rate & rhythm ABDOMEN:  normoactive bowel sounds , non tender, not distended. No palpable hepatosplenomegaly.  Extremity: no pedal edema  PSYCH: alert & oriented x 3 with fluent  speech NEURO: no focal motor/sensory deficits  LABORATORY DATA:  I have reviewed the data as listed  . CBC Latest Ref Rng & Units 05/09/2019 02/09/2019 11/04/2018  WBC 3.8 - 10.8 Thousand/uL 5.3 6.1 5.6  Hemoglobin 11.7 - 15.5 g/dL 13.8 13.6 12.8  Hematocrit 35.0 - 45.0 % 39.5 39.7 37.4  Platelets 140 - 400 Thousand/uL 330 359 348.0   . CBC    Component Value Date/Time   WBC 5.3 05/09/2019 1155   RBC 4.27 05/09/2019 1155   HGB 13.8 05/09/2019 1155   HGB 13.6 02/09/2019 1007   HGB 13.2 10/28/2016 1029   HCT 39.5 05/09/2019 1155   HCT 39.7 02/09/2019 1007   HCT 37.5 10/28/2016 1029   PLT 330 05/09/2019 1155   PLT 359 02/09/2019 1007   MCV 92.5 05/09/2019 1155   MCV 96 02/09/2019 1007   MCV 99.5 10/28/2016 1029   MCH 32.3 05/09/2019 1155   MCHC 34.9 05/09/2019 1155   RDW 13.8 05/09/2019 1155   RDW 14.1 02/09/2019 1007   RDW 12.7 10/28/2016 1029   LYMPHSABS 1,463 05/09/2019 1155   LYMPHSABS 1.6 02/09/2019 1007   LYMPHSABS 1.9 10/28/2016 1029   MONOABS 0.3 11/04/2018 1351   MONOABS 0.3 10/28/2016 1029   EOSABS 154 05/09/2019 1155   EOSABS 0.1 02/09/2019 1007   BASOSABS 53 05/09/2019 1155   BASOSABS 0.1 02/09/2019 1007   BASOSABS 0.0 10/28/2016 1029    . Lab Results  Component Value Date   RETICCTPCT 2.27 (H) 10/28/2016   RBC 4.27 05/09/2019   RETICCTABS 85.58 10/28/2016    . CMP Latest Ref Rng & Units 05/09/2019 02/09/2019 11/04/2018  Glucose 65 - 99 mg/dL 96 83 96  BUN 7 - 25 mg/dL 16 15 19   Creatinine 0.50 - 0.99 mg/dL 0.75 0.86 0.76  Sodium 135 - 146 mmol/L 139 142 139  Potassium 3.5 - 5.3 mmol/L 4.4 4.5 4.0  Chloride 98 - 110 mmol/L 102 103 100  CO2 20 - 32 mmol/L 29 25 30   Calcium 8.6 - 10.4 mg/dL 10.1 10.1 10.0  Total Protein 6.1 - 8.1 g/dL 7.6 7.3 7.4  Total Bilirubin 0.2 - 1.2 mg/dL 0.7 0.6 0.7  Alkaline Phos 39 - 117 IU/L - 106 94  AST 10 - 35 U/L 21 23 22   ALT 6 - 29 U/L 22 23 24     Lab Results  Component Value Date   FERRITIN 58 05/09/2019     RADIOGRAPHIC STUDIES: I have personally reviewed the radiological images as listed and agreed with the findings in the report. US Abdomen Complete  Result Date: 05/02/2019 CLINICAL DATA:  Intermittent postprandial abdominal pain. EXAM: ABDOMEN ULTRASOUND COMPLETE COMPARISON:  None. FINDINGS: Gallbladder: Surgically absent. Common bile duct: Diameter: 7.2 mm Liver: Normal echogenicity without focal lesion or biliary dilatation. Portal vein is patent on color Doppler imaging with normal direction of blood flow towards the liver. IVC: Normal caliber. Pancreas: Poorly visualized due to overlying bowel gas and poor sonographic window. Spleen: Normal size.  No focal lesions. Right Kidney: Length: Right 10.4 cm. Normal renal cortical thickness and echogenicity without hydronephrosis or worrisome renal lesion. There is a 4.0 x 3.7 x 4.2 cm upper pole cyst noted. Left Kidney: Length: 10.6 cm. Normal renal cortical thickness and echogenicity without focal lesions or hydronephrosis. Abdominal aorta: Normal caliber Other findings: None. IMPRESSION: 1. Status post cholecystectomy.  No biliary dilatation. 2. Unremarkable sonographic appearance of the liver, spleen and both kidneys.  Simple right renal cyst is noted. 3. Poorly visualized pancreas. Electronically Signed   By: Marijo Sanes M.D.   On: 05/02/2019 14:18    ASSESSMENT & PLAN:   Jade Cardenas is a pleasant 69 year old female with  #1 Severe microcytic hypochromic anemia  Due to iron deficiency. Patient's hemoglobin has responded very well to IV iron with improvement in her hemoglobin from 7.2 to 12.4 and now is stable at 13.2 with nl MCV . Ferritin levels were elevated to 800's post IV Iron and likely as an acute phase reactant.  #2 severe iron deficiency likely due to chronic GI bleeding + poor oral iron absorption. She has a EGD and colonoscopy in 2013.  Previous history of gastric ulcer requiring blood transfusions.  Also send gastric polyps and a  large hernia and prone to developing Cameron ulcers, previous colonic polyps and previous bleeding hemorrhoids.  Patient reports having recent EGD and colonoscopy with [name] in April 2017 which showed a large gastric ulcer. She reports that it was noted to be benign and Helicobacter pylori negative. The reports of the studies and the pathology are not available to [place] at this time. Patient notes that she has had f/u EGD which showed that the GU has healed - EGD read not available to [place] currently.  #3 history of poor tolerance to oral iron with significant GI distress.  She is also on chronic PPI therapy that puts her at higher risk of developing iron deficiency due to poor absorption.  And also B12 deficiency and possible vitamin D deficiency. Plan: -Discussed pt labwork, 05/09/19; all values are WNL.  -Discussed 05/09/2019 Iron an TIBC is as follows: Iron at 74, TIBC at 366, Sat Ratios at 20, Ferritin at 58. -Advised pt that she is not currently anemic, but there has been a notable drop in her Ferritin levels over the last few months -Advised pt that her her autoimmune conditions could be causing some inflammation that is falsely elevating her Ferritin numbers -Goal Ferritin >100, if pt was anemic would push Ferritin to >200 -Recommend pt continue to f/u with Dr. Estanislado Pandy  -Recommend pt continue to f/u with PCP -Will give 1 dose of IV Feraheme in 5-7 days  -Will see back as needed -Please refer back if pt's Ferritin drops again  FOLLOW UP: IV feraheme x 1 dose in 5-7 days Continue f/u with PCP and rheumatology RTC with Dr Irene Limbo as needed  The total time spent in the appt was 20 minutes and more than 50% was on counseling and direct patient cares.  All of the patient's questions were answered with apparent satisfaction. The patient knows to call the clinic with any problems, questions or concerns.    Sullivan Lone MD [place] AAHIVMS [facility] Hematology/Oncology Physician [facility]  (Office):       [phone] (Work cell):  [phone] (Fax):           [phone]  05/29/2019 4:18 PM   I, Yevette Edwards, am acting as a scribe for Dr. Sullivan Lone.   .I have reviewed the above documentation for accuracy and completeness, and I agree with the above. Brunetta Genera MD

## 2019-06-07 ENCOUNTER — Other Ambulatory Visit: Payer: Self-pay

## 2019-06-07 ENCOUNTER — Inpatient Hospital Stay: Payer: Medicare Other | Attending: Hematology

## 2019-06-07 VITALS — BP 161/66 | HR 71 | Temp 98.0°F | Resp 18

## 2019-06-07 DIAGNOSIS — D509 Iron deficiency anemia, unspecified: Secondary | ICD-10-CM | POA: Insufficient documentation

## 2019-06-07 DIAGNOSIS — E611 Iron deficiency: Secondary | ICD-10-CM

## 2019-06-07 DIAGNOSIS — Z79899 Other long term (current) drug therapy: Secondary | ICD-10-CM | POA: Diagnosis not present

## 2019-06-07 MED ORDER — LORATADINE 10 MG PO TABS
ORAL_TABLET | ORAL | Status: AC
  Filled 2019-06-07: qty 1

## 2019-06-07 MED ORDER — ACETAMINOPHEN 325 MG PO TABS
ORAL_TABLET | ORAL | Status: AC
  Filled 2019-06-07: qty 2

## 2019-06-07 MED ORDER — ACETAMINOPHEN 325 MG PO TABS
650.0000 mg | ORAL_TABLET | Freq: Once | ORAL | Status: AC
  Administered 2019-06-07: 650 mg via ORAL

## 2019-06-07 MED ORDER — SODIUM CHLORIDE 0.9 % IV SOLN
Freq: Once | INTRAVENOUS | Status: AC
  Filled 2019-06-07: qty 250

## 2019-06-07 MED ORDER — LORATADINE 10 MG PO TABS
10.0000 mg | ORAL_TABLET | Freq: Once | ORAL | Status: AC
  Administered 2019-06-07: 10 mg via ORAL

## 2019-06-07 MED ORDER — SODIUM CHLORIDE 0.9 % IV SOLN
510.0000 mg | Freq: Once | INTRAVENOUS | Status: AC
  Administered 2019-06-07: 510 mg via INTRAVENOUS
  Filled 2019-06-07: qty 510

## 2019-06-07 NOTE — Patient Instructions (Signed)

## 2019-07-11 NOTE — Progress Notes (Signed)
69 y.o. G0P0000 Widowed White or Caucasian female here for annual exam.  Had left knee surgery in June with Dr. Maureen Ralphs.  Pain is better but still there.    Did have Covid vaccination.    H/o sever microcytic hypochromic anemia due to iron deficiency.  Had IV iron.    Is followed by Dr. Estanislado Pandy for rheumatoid arthritis.    Patient's last menstrual period was 03/02/1984.          Sexually active: No.  The current method of family planning is status post hysterectomy.    Exercising: Yes.    walking Smoker:  no  Health Maintenance: Pap: 07-19-2018 neg, 03-15-17 neg, 03-13-16 neg HPV HR neg History of abnormal Pap:  Yes, DES exposure MMG:  11-01-2018 category b density birads 1:neg Colonoscopy:  06-13-15 f/u 45yrs BMD:   09-15-17 osteopenia TDaP:  07/19/2018 Pneumonia vaccine(s):  2015 Shingrix:   2018 Hep C testing: 2016 neg Screening Labs: cbc with diff, cmp, iron studies will be obtained today   reports that she has never smoked. She has never used smokeless tobacco. She reports that she does not drink alcohol or use drugs.  Past Medical History:  Diagnosis Date  . Anemia    iron def  . Arthritis    rheumatoid, psoriatic, osteoarthritis   . Bone spur    RIGHT FOOT   . Broken toe   . Bruises easily   . Cellulitis   . Chronic back pain   . COLONIC POLYPS, HX OF 02/09/2007  . Cough, persistent 01/21/2012   poss from acei   . 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 Femur fracture, left ([place])   . Fibromyalgia    doesn't require meds  . GASTRIC ULCER, ACUTE, HEMORRHAGE, HX OF 02/09/2007  . GERD (gastroesophageal reflux disease)    takes Nexium daily  . Goiter   . H/O hiatal hernia   . Hx of seasonal allergies    takes Claritin daily  . HYPERLIPIDEMIA 12/29/2006   takes Lovastatin nightly  . HYPERTENSION 12/29/2006   takes Enalapril daily  . IBS 04/28/2007  . Insomnia    related to pain;takes Flexeril and Tylenol PM nightly  . Joint pain   . Joint swelling   . Osteopenia   .  Palpitations 02/09/2007  . PONV (postoperative nausea and vomiting)    NAUSEA  . REDUCTION MAMMOPLASTY, HX OF 02/09/2007  . Right bundle branch block 02/09/2007  . S/P lumbar fusion 6 13 09/03/2011   L4 L5  posterior   . SYNCOPE 12/07/2007  . Tendonitis    RLE   . UNS ADVRS EFF OTH RX MEDICINAL\T\BIOLOGICAL SBSTNC 02/09/2007    Past Surgical History:  Procedure Laterality Date  . BACK SURGERY  12+yrs ago   Synovial Cyst removal  . BACK SURGERY  6/13   lumbar L4-L5 fusion, lamenectomy  . bone spur  6-39yrs ago   right ankle  . BUNIONECTOMY  10/03/14   right foot  . CERVICAL FUSION     2 12   . CHOLECYSTECTOMY  1996  . HARDWARE REMOVAL Left 08/01/2018   Procedure: Left knee hardware removal;  Surgeon: Gaynelle Arabian, MD;  Location: [facility];  Service: Orthopedics;  Laterality: Left;  80min  . JOINT REPLACEMENT    . KNEE ARTHROPLASTY    . POSTERIOR CERVICAL LAMINECTOMY Left 07/30/2015   Procedure: Laminectomy and Foraminotomy - left - Cervical two -Cervical three;  Surgeon: Earnie Larsson, MD;  Location: [facility];  Service:  Neurosurgery;  Laterality: Left;  . REDUCTION MAMMAPLASTY Bilateral 1989  . TONSILLECTOMY     as a child  . TOTAL ABDOMINAL HYSTERECTOMY W/ BILATERAL SALPINGOOPHORECTOMY  1986  . TOTAL KNEE ARTHROPLASTY N/A 06/21/2017   Procedure: LEFT TOTAL KNEE ARTHROPLASTY AND RIGHT KNEE CORTISONE INJECTION;  Surgeon: Gaynelle Arabian, MD;  Location: [facility];  Service: Orthopedics;  Laterality: N/A;    Current Outpatient Medications  Medication Sig Dispense Refill  . acetaminophen (TYLENOL) 650 MG CR tablet Take 1,300 mg by mouth See admin instructions. Take 1300 mg in the morning and may take an additional 1300 mg dose as needed for pain    . amLODipine (NORVASC) 5 MG tablet TAKE 1 TABLET BY MOUTH EVERY DAY 90 tablet 3  . amoxicillin (AMOXIL) 500 MG capsule Take 2,000 mg by mouth See admin instructions. Take 2000 mg 1 hour prior to dental work    . Calcium Carb-Cholecalciferol  (CALCIUM+D3 PO) Take 1 tablet by mouth daily.    . diphenhydramine-acetaminophen (TYLENOL PM) 25-500 MG TABS tablet Take 2 tablets by mouth at bedtime.    . Golimumab (SIMPONI) 50 MG/0.5ML SOAJ INJECT 50MG  (1 PEN) UNDER THE SKIN ONCE MONTHLY 1.5 mL 0  . losartan (COZAAR) 100 MG tablet TAKE 1 TABLET BY MOUTH EVERY DAY 90 tablet 3  . lovastatin (MEVACOR) 20 MG tablet TAKE 1 TABLET BY MOUTH EVERY DAY IN THE EVENING 90 tablet 3  . methotrexate 50 MG/2ML injection INJECT 0.5 ML (12.5 MG TOTAL) INTO THE SKIN ONCE A WEEK. (Patient taking differently: INJECT 0.3ML INTO THE SKIN ONCE WEEKLY.) 6 mL 0  . Multiple Vitamins-Minerals (MULTIVITAMIN ADULTS PO) Take 1 tablet by mouth daily.     Marland Kitchen omeprazole (PRILOSEC) 40 MG capsule TAKE 1 CAPSULE BY MOUTH EVERY DAY 90 capsule 3  . Tuberculin-Allergy Syringes (B-D ALLERGY SYRINGE 1CC/28G) 28G X 1/2" 1 ML MISC USE AS DIRECTED ONCE WEEKLY 12 each 4   No current facility-administered medications for this visit.    Family History  Problem Relation Age of Onset  . Diabetes Mother   . Colon cancer Mother        x2  . Arthritis Mother   . Hypertension Mother   . Endometrial cancer Mother   . Stroke Father   . Heart disease Father   . Colon cancer Father   . Arthritis Brother   . Hypertension Brother   . Hyperlipidemia Brother   . Infertility Brother   . Diabetes Brother   . Other Other        DISH brother  . Hypertension Maternal Grandmother   . Stroke Maternal Grandmother   . Anesthesia problems Neg Hx   . Hypotension Neg Hx   . Malignant hyperthermia Neg Hx   . Pseudochol deficiency Neg Hx     Review of Systems  Constitutional: Negative.   HENT: Negative.   Eyes: Negative.   Respiratory: Negative.   Cardiovascular: Negative.   Gastrointestinal: Negative.   Endocrine: Negative.   Genitourinary: Negative.   Musculoskeletal: Negative.   Skin: Negative.   Allergic/Immunologic: Negative.   Neurological: Negative.   Hematological: Negative.    Psychiatric/Behavioral: Negative.     Exam:   BP 118/80   Pulse 74   Temp (!) 97 F (36.1 C) (Skin)   Resp 16   Ht 5' 3.5" (1.613 m)   Wt 217 lb (98.4 kg)   LMP 03/02/1984   BMI 37.84 kg/m   Height: 5' 3.5" (161.3 cm)  General appearance: alert, cooperative  and appears stated age Head: Normocephalic, without obvious abnormality, atraumatic Neck: no adenopathy, supple, symmetrical, trachea midline and thyroid normal to inspection and palpation Lungs: clear to auscultation bilaterally Breasts: normal appearance, no masses or tenderness Heart: regular rate and rhythm Abdomen: soft, non-tender; bowel sounds normal; no masses,  no organomegaly Extremities: extremities normal, atraumatic, no cyanosis or edema Skin: Skin color, texture, turgor normal. No rashes or lesions Lymph nodes: Cervical, supraclavicular, and axillary nodes normal. No abnormal inguinal nodes palpated Neurologic: Grossly normal   Pelvic: External genitalia:  no lesions              Urethra:  normal appearing urethra with no masses, tenderness or lesions              Bartholins and Skenes: normal                 Vagina: normal appearing vagina with normal color and discharge, no lesions              Cervix: absent              Pap taken: Yes.   Bimanual Exam:  Uterus:  uterus absent              Adnexa: no mass, fullness, tenderness               Rectovaginal: Confirms               Anus:  normal sphincter tone, no lesions  Chaperone, Terence Lux, CMA, was present for exam.  A:  Well Woman with normal exam H/o TAH/BSO, off HRT H/o DES exposure Family hx of colon cancer Psoriatic and rheumatoid arthritis, followed by Dr. Estanislado Pandy Hypertension Knee pain  P:   Mammogram guidelines reviewed.   pap smear obtained due to DES exposure Colonoscopy is up to date Vaccines reviewed CBC with diff, CMP, ferritin and iron studies obtained today.  Will need to send to Dr. Irene Limbo and Dr. Estanislado Pandy Return  annually or prn

## 2019-07-13 ENCOUNTER — Other Ambulatory Visit: Payer: Self-pay | Admitting: *Deleted

## 2019-07-13 MED ORDER — SIMPONI 50 MG/0.5ML ~~LOC~~ SOAJ: SUBCUTANEOUS | 0 refills | Status: DC

## 2019-07-13 NOTE — Telephone Encounter (Signed)
Refill request received via fax  Last Visit: 05/09/2019 Next Visit: 10/16/2019 Labs: 05/09/2019 WNL  TB Gold: 11/04/2019 Neg   Current Dose per office note 05/09/2019 : Simponi 50 mg every 28 days   Okay to refill per Dr. Estanislado Pandy

## 2019-07-24 ENCOUNTER — Other Ambulatory Visit (HOSPITAL_COMMUNITY)
Admission: RE | Admit: 2019-07-24 | Discharge: 2019-07-24 | Disposition: A | Discharge status: Home / Self Care | Payer: Medicare Other | Source: Ambulatory Visit | Attending: Obstetrics & Gynecology | Admitting: Obstetrics & Gynecology

## 2019-07-24 ENCOUNTER — Other Ambulatory Visit: Payer: Self-pay

## 2019-07-24 ENCOUNTER — Ambulatory Visit (INDEPENDENT_AMBULATORY_CARE_PROVIDER_SITE_OTHER): Payer: Medicare Other | Admitting: Obstetrics & Gynecology

## 2019-07-24 ENCOUNTER — Encounter: Payer: Self-pay | Admitting: Obstetrics & Gynecology

## 2019-07-24 VITALS — BP 118/80 | HR 74 | Temp 97.0°F | Resp 16 | Ht 63.5 in | Wt 217.0 lb

## 2019-07-24 DIAGNOSIS — D509 Iron deficiency anemia, unspecified: Secondary | ICD-10-CM | POA: Diagnosis not present

## 2019-07-24 DIAGNOSIS — Z01419 Encounter for gynecological examination (general) (routine) without abnormal findings: Secondary | ICD-10-CM | POA: Diagnosis not present

## 2019-07-24 DIAGNOSIS — Z124 Encounter for screening for malignant neoplasm of cervix: Secondary | ICD-10-CM

## 2019-07-24 DIAGNOSIS — Z9189 Other specified personal risk factors, not elsewhere classified: Secondary | ICD-10-CM | POA: Diagnosis not present

## 2019-07-25 LAB — CBC WITH DIFFERENTIAL/PLATELET
Basophils Absolute: 0.1 10*3/uL (ref 0.0–0.2)
Basos: 1 %
EOS (ABSOLUTE): 0.1 10*3/uL (ref 0.0–0.4)
Eos: 1 %
Hematocrit: 40.4 % (ref 34.0–46.6)
Hemoglobin: 14 g/dL (ref 11.1–15.9)
Immature Grans (Abs): 0 10*3/uL (ref 0.0–0.1)
Immature Granulocytes: 0 %
Lymphocytes Absolute: 2.3 10*3/uL (ref 0.7–3.1)
Lymphs: 28 %
MCH: 33 pg (ref 26.6–33.0)
MCHC: 34.7 g/dL (ref 31.5–35.7)
MCV: 95 fL (ref 79–97)
Monocytes Absolute: 0.7 10*3/uL (ref 0.1–0.9)
Monocytes: 8 %
Neutrophils Absolute: 5.1 10*3/uL (ref 1.4–7.0)
Neutrophils: 62 %
Platelets: 367 10*3/uL (ref 150–450)
RBC: 4.24 x10E6/uL (ref 3.77–5.28)
RDW: 14.3 % (ref 11.7–15.4)
WBC: 8.1 10*3/uL (ref 3.4–10.8)

## 2019-07-25 LAB — COMPREHENSIVE METABOLIC PANEL
ALT: 28 IU/L (ref 0–32)
AST: 24 IU/L (ref 0–40)
Albumin/Globulin Ratio: 1.6 (ref 1.2–2.2)
Albumin: 4.6 g/dL (ref 3.8–4.8)
Alkaline Phosphatase: 113 IU/L (ref 48–121)
BUN/Creatinine Ratio: 22 (ref 12–28)
BUN: 20 mg/dL (ref 8–27)
Bilirubin Total: 0.5 mg/dL (ref 0.0–1.2)
CO2: 24 mmol/L (ref 20–29)
Calcium: 9.5 mg/dL (ref 8.7–10.3)
Chloride: 100 mmol/L (ref 96–106)
Creatinine, Ser: 0.91 mg/dL (ref 0.57–1.00)
GFR calc Af Amer: 76 mL/min/{1.73_m2} (ref >59)
GFR calc non Af Amer: 65 mL/min/{1.73_m2} (ref >59)
Globulin, Total: 2.9 g/dL (ref 1.5–4.5)
Glucose: 91 mg/dL (ref 65–99)
Potassium: 4.1 mmol/L (ref 3.5–5.2)
Sodium: 138 mmol/L (ref 134–144)
Total Protein: 7.5 g/dL (ref 6.0–8.5)

## 2019-07-25 LAB — IRON,TIBC AND FERRITIN PANEL
Ferritin: 175 ng/mL — ABNORMAL HIGH (ref 15–150)
Iron Saturation: 21 % (ref 15–55)
Iron: 67 ug/dL (ref 27–139)
Total Iron Binding Capacity: 324 ug/dL (ref 250–450)
UIBC: 257 ug/dL (ref 118–369)

## 2019-07-25 LAB — CYTOLOGY - PAP: Diagnosis: NEGATIVE

## 2019-07-28 ENCOUNTER — Telehealth: Payer: Self-pay

## 2019-07-28 NOTE — Telephone Encounter (Signed)
Left message for call back.

## 2019-07-28 NOTE — Telephone Encounter (Signed)
Patient notified of results. See lab 

## 2019-07-28 NOTE — Telephone Encounter (Signed)
-----   Message from Megan Salon, MD sent at 07/28/2019  7:23 AM EDT ----- Please call pt.  Pap normal.  02 recall.  Lab work--CBC, CMP, iron studies normal.  Please confirm she wants me to send these to Dr. Estanislado Pandy and Dr. Irene Limbo.  Thanks.

## 2019-07-28 NOTE — Telephone Encounter (Signed)
Patient returned a call to Joy. °

## 2019-08-07 ENCOUNTER — Encounter: Payer: Self-pay | Admitting: Obstetrics & Gynecology

## 2019-09-01 ENCOUNTER — Other Ambulatory Visit: Payer: Self-pay | Admitting: Rheumatology

## 2019-09-01 NOTE — Telephone Encounter (Addendum)
Last Visit: 05/09/2019 Next Visit: 10/16/2019 Labs: 07/24/2019 WNL TB Gold: 11/04/2018 Neg   Current Dose per office note 05/09/2019 : methotrexate 0.3 ml every 7 days. She has reduced her methotrexate dose  Okay to refill MTX at reduced dose?

## 2019-09-01 NOTE — Telephone Encounter (Signed)
Per office note patient decreased MTX.   Verified verbal prescription with Hazel Sams, PA-C to send in MTX 0.3 mL weekly # 4 mL No refills.

## 2019-09-01 NOTE — Telephone Encounter (Signed)
Please clarify why the patient has reduced her dose of MTX.

## 2019-09-22 ENCOUNTER — Other Ambulatory Visit: Payer: Self-pay | Admitting: Physician Assistant

## 2019-09-22 NOTE — Telephone Encounter (Signed)
Last Visit: 05/09/2019 Next Visit: 10/16/2019 Labs: 07/24/2019 WNL   Current Dose per office note on 05/09/2019: methotrexate 0.3 ml every 7 days  Okay to refill per Dr. Estanislado Pandy

## 2019-10-02 NOTE — Progress Notes (Signed)
Office Visit Note  Patient: Jade Cardenas             Date of Birth: [birth year]           MRN: [id number]             PCP: [name], MD Referring: [name], MD Visit Date: 10/16/2019 Occupation: @GUAROCC @  Subjective:  Pain in multiple joints.   History of Present Illness: Jade Cardenas is a 69 y.o. female with history of psoriatic arthritis, psoriasis, osteoarthritis and fibromyalgia.  She states she continues to have some joint pain and stiffness.  She has been experiencing some tingling and numbness in her ankles.  She has achiness in most of her muscles.  She denies any joint swelling.  Her right shoulder joint is still bothers her.  She states her left knee replacement is still painful but she is gradually recovering from it.  She has a few psoriasis patches on her extremities and her scalp.  She is interested in getting COVID-19 booster vaccination.  Activities of Daily Living:  Patient reports morning stiffness for several  hours.   Patient Reports nocturnal pain.  Difficulty dressing/grooming: Denies Difficulty climbing stairs: Reports Difficulty getting out of chair: Reports Difficulty using hands for taps, buttons, cutlery, and/or writing: Reports  Review of Systems  Constitutional: Positive for fatigue.  HENT: Positive for mouth dryness and nose dryness. Negative for mouth sores.   Eyes: Positive for dryness.  Respiratory: Negative for shortness of breath and difficulty breathing.   Cardiovascular: Negative for chest pain and palpitations.  Gastrointestinal: Negative for blood in stool, constipation and diarrhea.  Endocrine: Negative for increased urination.  Genitourinary: Negative for difficulty urinating.  Musculoskeletal: Positive for arthralgias, joint pain, joint swelling, myalgias, morning stiffness, muscle tenderness and myalgias.  Skin: Positive for rash. Negative for color change and redness.  Allergic/Immunologic: Negative for  susceptible to infections.  Neurological: Positive for numbness. Negative for dizziness, memory loss and weakness.  Hematological: Positive for bruising/bleeding tendency.  Psychiatric/Behavioral: Negative for confusion.    PMFS History:  Patient Active Problem List   Diagnosis Date Noted  . History of total knee replacement, left 06/28/2017  . OA (osteoarthritis) of knee 06/21/2017  . Varicose veins of bilateral lower extremities with other complications 85/27/7824  . DDD (degenerative disc disease), lumbar 10/19/2016  . Goiter 08/28/2016  . Family history of malignant neoplasm of digestive organ 08/27/2016  . 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 Fibromyalgia 05/17/2016  . High risk medication use 02/18/2016  . DJD (degenerative joint disease), cervical 02/18/2016  . Heel spur 09/29/2015  . Ingrown toenail 09/29/2015  . Foraminal stenosis of cervical region 07/30/2015  . Microcytic hypochromic anemia 05/22/2015  . Tailor's bunion, left 01/18/2015  . H/O hiatal hernia   . Bilateral primary osteoarthritis of knee 12/15/2013  . Iron deficiency 12/15/2013  . Psoriatic arthritis ([place]) 12/15/2013  . History of foot fracture 12/15/2013  . Dermatitis 10/17/2013  . Arthritis 05/31/2013  . 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 Skin lesion 12/09/2012  . Tinnitus 06/23/2012  . Scalp lesion 01/21/2012  . Headache(784.0) 01/21/2012  . Hx of psoriasis 01/21/2012  . Weight gain 01/21/2012  . Ankylosing spondylitis  possible 09/03/2011  . Hx of transfusion 09/03/2011  . S/P lumbar fusion 6 13 09/03/2011  . Lumbar stenosis with neurogenic claudication 08/05/2011  . LBP (low back pain) 01/17/2011  . ALLERGIC RHINITIS 02/19/2010  . GERD 02/19/2010  . ARTHRITIS 02/19/2010  .  SPINAL STENOSIS, CERVICAL 02/19/2010  . OSTEOPENIA 02/19/2010  . RASH AND OTHER NONSPECIFIC SKIN ERUPTION 12/07/2007  . IBS 04/28/2007  . History of gastric ulcer 02/09/2007  . COLONIC  POLYPS, HX OF 02/09/2007  . CHOLECYSTECTOMY, HX OF 02/09/2007  . Hyperlipidemia 12/29/2006  . Essential hypertension 12/29/2006    Past Medical History:  Diagnosis Date  . Anemia    iron def  . Arthritis    rheumatoid, psoriatic, osteoarthritis   . Bone spur    RIGHT FOOT   . Broken toe   . Bruises easily   . Cellulitis   . Chronic back pain   . COLONIC POLYPS, HX OF 02/09/2007  . Cough, persistent 01/21/2012   poss from acei   . 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 Femur fracture, left ([place])   . Fibromyalgia    doesn't require meds  . GASTRIC ULCER, ACUTE, HEMORRHAGE, HX OF 02/09/2007  . GERD (gastroesophageal reflux disease)    takes Nexium daily  . Goiter   . H/O hiatal hernia   . Hx of seasonal allergies    takes Claritin daily  . HYPERLIPIDEMIA 12/29/2006   takes Lovastatin nightly  . HYPERTENSION 12/29/2006   takes Enalapril daily  . IBS 04/28/2007  . Insomnia    related to pain;takes Flexeril and Tylenol PM nightly  . Joint pain   . Joint swelling   . Osteopenia   . Palpitations 02/09/2007  . PONV (postoperative nausea and vomiting)    NAUSEA  . REDUCTION MAMMOPLASTY, HX OF 02/09/2007  . Right bundle branch block 02/09/2007  . S/P lumbar fusion 6 13 09/03/2011   L4 L5  posterior   . SYNCOPE 12/07/2007  . Tendonitis    RLE   . UNS ADVRS EFF OTH RX MEDICINAL\T\BIOLOGICAL SBSTNC 02/09/2007    Family History  Problem Relation Age of Onset  . Diabetes Mother   . Colon cancer Mother        x2  . Arthritis Mother   . Hypertension Mother   . Endometrial cancer Mother   . Stroke Father   . Heart disease Father   . Colon cancer Father   . Arthritis Brother   . Hypertension Brother   . Hyperlipidemia Brother   . Infertility Brother   . Diabetes Brother   . Other Other        DISH brother  . Hypertension Maternal Grandmother   . Stroke Maternal Grandmother   . Anesthesia problems Neg Hx   . Hypotension Neg Hx   . Malignant hyperthermia Neg Hx   .  Pseudochol deficiency Neg Hx    Past Surgical History:  Procedure Laterality Date  . BACK SURGERY  12+yrs ago   Synovial Cyst removal  . BACK SURGERY  6/13   lumbar L4-L5 fusion, lamenectomy  . bone spur  6-13yrs ago   right ankle  . BUNIONECTOMY  10/03/14   right foot  . CERVICAL FUSION     2 12   . CHOLECYSTECTOMY  1996  . HARDWARE REMOVAL Left 08/01/2018   Procedure: Left knee hardware removal;  Surgeon: Gaynelle Arabian, MD;  Location: [facility];  Service: Orthopedics;  Laterality: Left;  95min  . JOINT REPLACEMENT    . KNEE ARTHROPLASTY    . POSTERIOR CERVICAL LAMINECTOMY Left 07/30/2015   Procedure: Laminectomy and Foraminotomy - left - Cervical two -Cervical three;  Surgeon: Earnie Larsson, MD;  Location: [facility];  Service: Neurosurgery;  Laterality: Left;  . REDUCTION MAMMAPLASTY  Bilateral 1989  . TONSILLECTOMY     as a child  . TOTAL ABDOMINAL HYSTERECTOMY W/ BILATERAL SALPINGOOPHORECTOMY  1986  . 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 Fluad Quad(high Dose 65+) 11/04/2018  . Influenza Split 11/29/2012, 11/27/2013  . Influenza Whole 11/09/2008  . Influenza, High Dose Seasonal PF 11/26/2015, 11/30/2016, 11/03/2017  . Influenza,inj,Quad PF,6+ Mos 11/27/2014  . Influenza-Unspecified 12/22/2016  . PFIZER SARS-COV-2 Vaccination 04/08/2019, 05/03/2019  . PPD Test 12/18/2013, 11/27/2014, 11/26/2015, 11/30/2016  . Pneumococcal Conjugate-13 02/28/2013  . Pneumococcal Polysaccharide-23 03/03/2003, 02/28/2014  . Td 05/09/2008  . Tdap 07/19/2018  . Zoster 01/20/2011  . Zoster  Recombinat (Shingrix) 12/03/2016, 02/04/2017     Objective: Vital Signs: BP 126/70 (BP Location: Left Arm, Patient Position: Sitting, Cuff Size: Normal)   Pulse 74   Resp 16   Ht 5' 3.5" (1.613 m)   Wt 218 lb 6.4 oz (99.1 kg)   LMP 03/02/1984   BMI 38.08 kg/m    Physical Exam Vitals and nursing note reviewed.  Constitutional:      Appearance: She is well-developed.  HENT:     Head: Normocephalic and atraumatic.  Eyes:     Conjunctiva/sclera: Conjunctivae normal.  Cardiovascular:     Rate and Rhythm: Normal rate and regular rhythm.     Heart sounds: Normal heart sounds.  Pulmonary:     Effort: Pulmonary effort is normal.     Breath sounds: Normal breath sounds.  Abdominal:     General: Bowel sounds are normal.     Palpations: Abdomen is soft.  Musculoskeletal:     Cervical back: Normal range of motion.  Lymphadenopathy:     Cervical: No cervical adenopathy.  Skin:    General: Skin is warm and dry.     Capillary Refill: Capillary refill takes less than 2 seconds.     Comments: 1 psoriasis patches noted over her left arm and a few in her scalp.  Neurological:     Mental Status: She is alert and oriented to person, place, and time.  Psychiatric:        Behavior: Behavior normal.      Musculoskeletal Exam: She has limited range of motion of her cervical spine.  She had good range of motion of bilateral shoulder joints with discomfort in her right shoulder joint.  Elbow joints, wrist joints were in good range of motion.  She had tenderness over bilateral CMC joints.  No MCP or DIP swelling or synovitis was noted.  Hip joints were in good range of motion.  Her left knee joint is replaced which had some discomfort.  Her ankle joints with good range of motion.  No synovitis was noted over PIPs or DIPs of her feet.  CDAI Exam: CDAI Score: 2.8  Patient Global: 6 mm; Provider Global: 2 mm Swollen: 0 ; Tender: 2  Joint Exam 10/16/2019      Right  Left  Glenohumeral   Tender      Knee      Tender     Investigation: No additional findings.  Imaging: No results  found.  Recent Labs: Lab Results  Component Value Date   WBC 8.1 07/24/2019   HGB 14.0 07/24/2019   PLT 367 07/24/2019   NA 138 07/24/2019   K 4.1 07/24/2019   CL 100 07/24/2019   CO2 24 07/24/2019   GLUCOSE 91 07/24/2019   BUN 20 07/24/2019   CREATININE 0.91 07/24/2019   BILITOT 0.5 07/24/2019   ALKPHOS 113 07/24/2019   AST 24 07/24/2019   ALT 28 07/24/2019   PROT 7.5 07/24/2019   ALBUMIN 4.6 07/24/2019   CALCIUM 9.5 07/24/2019   GFRAA 76 07/24/2019   QFTBGOLDPLUS NEGATIVE 11/04/2018    Speciality Comments: Failed therapy: Arava and Otezla due to diarrhea  Procedures:  No procedures performed Allergies: Erythromycin, Iodinated diagnostic agents, Ace inhibitors, Keflex [cephalexin], and Macrobid [nitrofurantoin]   Assessment / Plan:     Visit Diagnoses: Psoriatic arthritis (HCC)-she had no active synovitis.  Although she complains of some discomfort off and on.  High risk medication use - Simponi 50 mg every 28 days and methotrexate 0.3 ml every 7 days.Jolee Ewing and [place] caused diarrhea).  - - Plan: CBC with Differential/Platelet, COMPLETE METABOLIC PANEL WITH GFR today and then every 3 months to monitor for drug toxicity.  Hx of psoriasis-she has few psoriasis patches.  Primary osteoarthritis of right knee - moderate osteoarthritis and severe chondromalacia patella she continues to have some discomfort.  Total knee replacement status, left -by Dr. Wynelle Link.  She still have some discomfort in her left knee.  DDD (degenerative disc disease), cervical - S/P fusion  DDD (degenerative disc disease), lumbar - S/P fusion.  She has chronic pain.  Fibromyalgia-she continues to have some generalized pain and discomfort from fibromyalgia.  Need for regular exercise was emphasized.  Chronic fatigue-related to fibromyalgia.  Osteopenia of multiple sites - DEXA 09/15/17 osteopenia T -1.8.   Followed by her GYN.  Use of calcium rich diet and vitamin D use was discussed.  Other iron deficiency anemia-followed by hematology.  Screening examination for pulmonary tuberculosis - Plan: QuantiFERON-TB Gold Plus  Patient had her Covid vaccine in the past.  She has been using mask and social distancing.  She is try to get her booster.  The instructions were given and discussed.  Orders: Orders Placed This Encounter  Procedures  . CBC with Differential/Platelet  . COMPLETE METABOLIC PANEL WITH GFR  . QuantiFERON-TB Gold Plus   No orders of the defined types were placed in this encounter.     Follow-Up Instructions: Return in about 5 months (around 03/17/2020) for Psoriatic arthritis.   Bo Merino, MD  Note - This record has been created using Editor, commissioning.  Chart creation errors have been sought, but may not always  have been located. Such creation errors do not reflect on  the standard of medical care.

## 2019-10-11 ENCOUNTER — Other Ambulatory Visit: Payer: Self-pay | Admitting: Obstetrics & Gynecology

## 2019-10-11 DIAGNOSIS — Z1231 Encounter for screening mammogram for malignant neoplasm of breast: Secondary | ICD-10-CM

## 2019-10-13 ENCOUNTER — Other Ambulatory Visit: Payer: Self-pay | Admitting: *Deleted

## 2019-10-13 MED ORDER — SIMPONI 50 MG/0.5ML ~~LOC~~ SOAJ: SUBCUTANEOUS | 0 refills | Status: DC

## 2019-10-13 NOTE — Telephone Encounter (Signed)
Refill request received via fax  Last Visit:05/09/2019 Next Visit:10/16/2019 Labs:07/24/2019 WNL TB Gold: 11/04/2018 Neg   Current Dose per office note on 05/09/2019:Simponi 50 mg every 28 days  Okay to refill per Dr. Estanislado Pandy

## 2019-10-16 ENCOUNTER — Other Ambulatory Visit: Payer: Self-pay

## 2019-10-16 ENCOUNTER — Ambulatory Visit (INDEPENDENT_AMBULATORY_CARE_PROVIDER_SITE_OTHER): Payer: Medicare Other | Admitting: Rheumatology

## 2019-10-16 ENCOUNTER — Encounter: Payer: Self-pay | Admitting: Rheumatology

## 2019-10-16 VITALS — BP 126/70 | HR 74 | Resp 16 | Ht 63.5 in | Wt 218.4 lb

## 2019-10-16 DIAGNOSIS — M5136 Other intervertebral disc degeneration, lumbar region: Secondary | ICD-10-CM

## 2019-10-16 DIAGNOSIS — Z96652 Presence of left artificial knee joint: Secondary | ICD-10-CM | POA: Diagnosis not present

## 2019-10-16 DIAGNOSIS — D508 Other iron deficiency anemias: Secondary | ICD-10-CM

## 2019-10-16 DIAGNOSIS — Z111 Encounter for screening for respiratory tuberculosis: Secondary | ICD-10-CM

## 2019-10-16 DIAGNOSIS — Z79899 Other long term (current) drug therapy: Secondary | ICD-10-CM | POA: Diagnosis not present

## 2019-10-16 DIAGNOSIS — M797 Fibromyalgia: Secondary | ICD-10-CM

## 2019-10-16 DIAGNOSIS — M5030 Other cervical disc degeneration, unspecified cervical region: Secondary | ICD-10-CM | POA: Diagnosis not present

## 2019-10-16 DIAGNOSIS — L4050 Arthropathic psoriasis, unspecified: Secondary | ICD-10-CM | POA: Diagnosis not present

## 2019-10-16 DIAGNOSIS — M51369 Other intervertebral disc degeneration, lumbar region without mention of lumbar back pain or lower extremity pain: Secondary | ICD-10-CM

## 2019-10-16 DIAGNOSIS — M1711 Unilateral primary osteoarthritis, right knee: Secondary | ICD-10-CM

## 2019-10-16 DIAGNOSIS — Z872 Personal history of diseases of the skin and subcutaneous tissue: Secondary | ICD-10-CM | POA: Diagnosis not present

## 2019-10-16 DIAGNOSIS — R5382 Chronic fatigue, unspecified: Secondary | ICD-10-CM

## 2019-10-16 DIAGNOSIS — M8589 Other specified disorders of bone density and structure, multiple sites: Secondary | ICD-10-CM | POA: Diagnosis not present

## 2019-10-16 NOTE — Telephone Encounter (Signed)
Hi Jade Cardenas   We do not have  covid vaccine at our office  But I agree get the booster  When off the mtx   As possible  [facility] web sire or [facility] have info on appts for vaccine

## 2019-10-16 NOTE — Patient Instructions (Addendum)
COVID-19 vaccine recommendations:   COVID-19 vaccine is recommended for everyone (unless you are allergic to a vaccine component), even if you are on a medication that suppresses your immune system.   If you are on Methotrexate, Cellcept (mycophenolate), Rinvoq, Morrie Sheldon, and Olumiant- hold the medication for 1 week after each vaccine. Hold Methotrexate for 2 weeks after the single dose COVID-19 vaccine.   If you are on Orencia subcutaneous injection - hold medication one week prior to and one week after the first COVID-19 vaccine dose (only).   If you are on Orencia IV infusions- time vaccination administration so that the first COVID-19 vaccination will occur four weeks after the infusion and postpone the subsequent infusion by one week.   If you are on Cyclophosphamide or Rituxan infusions please contact your doctor prior to receiving the COVID-19 vaccine.   Do not take Tylenol or ant anti-inflammatory medications (NSAIDs) 24 hours prior to the COVID-19 vaccination.   There is no direct evidence about the efficacy of the COVID-19 vaccine in individuals who are on medications that suppress the immune system.   Even if you are fully vaccinated, and you are on any medications that suppress your immune system, please continue to wear a mask, maintain at least six feet social distance and practice hand hygiene.   If you develop a COVID-19 infection, please contact your PCP or our office to determine if you need antibody infusion.  The booster vaccination is available now.  If you had a Pfizer vaccine please get booster for [employer].  If you had Moderna vaccine then please get booster for Moderna.  [email or web address]  Standing Labs We placed an order today for your standing lab work.   Please have your standing labs drawn in November and every 3 months  If possible, please have your labs drawn 2 weeks prior to your  appointment so that the provider can discuss your results at your appointment.  We have open lab daily Monday through Thursday from 8:30-12:30 PM and 1:30-4:30 PM and Friday from 8:30-12:30 PM and 1:30-4:00 PM at the office of Dr. Bo Merino, [facility].   Please be advised, patients with office appointments requiring lab work will take precedents over walk-in lab work.  If possible, please come for your lab work on Monday and Friday afternoons, as you may experience shorter wait times. The office is located at [address], [place], [place],  [address] No appointment is necessary.   Labs are drawn by [facility]. Please bring your co-pay at the time of your lab draw.  You may receive a bill from [facility] for your lab work.  If you wish to have your labs drawn at another location, please call the office 24 hours in advance to send orders.  If you have any questions regarding directions or hours of operation,  please call [phone].   As a reminder, please drink plenty of water prior to coming for your lab work. Thanks!

## 2019-10-17 NOTE — Progress Notes (Signed)
CBC and CMP are normal.

## 2019-10-18 ENCOUNTER — Other Ambulatory Visit: Payer: Self-pay | Admitting: Rheumatology

## 2019-10-18 LAB — CBC WITH DIFFERENTIAL/PLATELET
Absolute Monocytes: 407 cells/uL (ref 200–950)
Basophils Absolute: 47 cells/uL (ref 0–200)
Basophils Relative: 0.8 %
Eosinophils Absolute: 83 cells/uL (ref 15–500)
Eosinophils Relative: 1.4 %
HCT: 39.9 % (ref 35.0–45.0)
Hemoglobin: 13.5 g/dL (ref 11.7–15.5)
Lymphs Abs: 1475 cells/uL (ref 850–3900)
MCH: 32.5 pg (ref 27.0–33.0)
MCHC: 33.8 g/dL (ref 32.0–36.0)
MCV: 95.9 fL (ref 80.0–100.0)
MPV: 10.1 fL (ref 7.5–12.5)
Monocytes Relative: 6.9 %
Neutro Abs: 3888 cells/uL (ref 1500–7800)
Neutrophils Relative %: 65.9 %
Platelets: 339 10*3/uL (ref 140–400)
RBC: 4.16 10*6/uL (ref 3.80–5.10)
RDW: 13.1 % (ref 11.0–15.0)
Total Lymphocyte: 25 %
WBC: 5.9 10*3/uL (ref 3.8–10.8)

## 2019-10-18 LAB — COMPLETE METABOLIC PANEL WITH GFR
AG Ratio: 1.6 (calc) (ref 1.0–2.5)
ALT: 26 U/L (ref 6–29)
AST: 23 U/L (ref 10–35)
Albumin: 4.5 g/dL (ref 3.6–5.1)
Alkaline phosphatase (APISO): 91 U/L (ref 37–153)
BUN: 17 mg/dL (ref 7–25)
CO2: 28 mmol/L (ref 20–32)
Calcium: 9.7 mg/dL (ref 8.6–10.4)
Chloride: 101 mmol/L (ref 98–110)
Creat: 0.78 mg/dL (ref 0.50–0.99)
GFR, Est African American: 91 mL/min/{1.73_m2} (ref >=60)
GFR, Est Non African American: 79 mL/min/{1.73_m2} (ref >=60)
Globulin: 2.9 g/dL (calc) (ref 1.9–3.7)
Glucose, Bld: 98 mg/dL (ref 65–99)
Potassium: 4.5 mmol/L (ref 3.5–5.3)
Sodium: 138 mmol/L (ref 135–146)
Total Bilirubin: 0.9 mg/dL (ref 0.2–1.2)
Total Protein: 7.4 g/dL (ref 6.1–8.1)

## 2019-10-18 LAB — QUANTIFERON-TB GOLD PLUS
Mitogen-NIL: 5.72 IU/mL
NIL: 0.03 IU/mL
QuantiFERON-TB Gold Plus: NEGATIVE
TB1-NIL: <0 IU/mL
TB2-NIL: <0 IU/mL

## 2019-10-18 NOTE — Telephone Encounter (Signed)
Last Visit: 10/16/2019 Next Visit: 03/20/2020 Labs: 10/16/2019 CBC and CMP are normal  Current Dose per office note 10/16/2019: methotrexate 0.3 ml every 7 days DX: Psoriatic arthritis   Okay to refill per Dr. Estanislado Pandy

## 2019-10-19 ENCOUNTER — Other Ambulatory Visit: Payer: Self-pay | Admitting: *Deleted

## 2019-10-19 ENCOUNTER — Telehealth: Payer: Self-pay | Admitting: *Deleted

## 2019-10-19 DIAGNOSIS — Z23 Encounter for immunization: Secondary | ICD-10-CM | POA: Diagnosis not present

## 2019-10-19 NOTE — Telephone Encounter (Signed)
Patient is scheduled to receive COVID vaccine booster 10/19/2019

## 2019-11-03 ENCOUNTER — Other Ambulatory Visit: Payer: Self-pay

## 2019-11-07 ENCOUNTER — Encounter: Payer: Self-pay | Admitting: Internal Medicine

## 2019-11-07 ENCOUNTER — Ambulatory Visit (INDEPENDENT_AMBULATORY_CARE_PROVIDER_SITE_OTHER): Payer: Medicare Other | Admitting: Internal Medicine

## 2019-11-07 ENCOUNTER — Other Ambulatory Visit: Payer: Self-pay

## 2019-11-07 VITALS — BP 116/68 | HR 69 | Temp 97.9°F | Ht 63.75 in | Wt 217.2 lb

## 2019-11-07 DIAGNOSIS — E785 Hyperlipidemia, unspecified: Secondary | ICD-10-CM

## 2019-11-07 DIAGNOSIS — L4050 Arthropathic psoriasis, unspecified: Secondary | ICD-10-CM | POA: Diagnosis not present

## 2019-11-07 DIAGNOSIS — Z23 Encounter for immunization: Secondary | ICD-10-CM

## 2019-11-07 DIAGNOSIS — G5793 Unspecified mononeuropathy of bilateral lower limbs: Secondary | ICD-10-CM

## 2019-11-07 DIAGNOSIS — I10 Essential (primary) hypertension: Secondary | ICD-10-CM

## 2019-11-07 DIAGNOSIS — E611 Iron deficiency: Secondary | ICD-10-CM | POA: Diagnosis not present

## 2019-11-07 DIAGNOSIS — R739 Hyperglycemia, unspecified: Secondary | ICD-10-CM

## 2019-11-07 DIAGNOSIS — Z79899 Other long term (current) drug therapy: Secondary | ICD-10-CM | POA: Diagnosis not present

## 2019-11-07 DIAGNOSIS — E559 Vitamin D deficiency, unspecified: Secondary | ICD-10-CM

## 2019-11-07 NOTE — Progress Notes (Signed)
Chief Complaint  Patient presents with   Annual Exam    Doing okay   Medication Management   Hyperlipidemia   Hypertension    HPI: Jade Cardenas 69 y.o. comes in today for yearly visit  .Since last visit. doing ok   BP  In range has a Number s  Had iron infusion    Wonder If still low    Vit d  tends to be low   Had labs but not lipids   Has some neuropathy feet but no ulcer or dx   Vaccine  On antibiotic  For upper tooth .  Gum inflammed . Uncertain if need root canal.   Amoxicillin.  Health Maintenance  Topic Date Due   MAMMOGRAM  10/31/2020   COLONOSCOPY  06/12/2025   TETANUS/TDAP  07/18/2028   INFLUENZA VACCINE  Completed   DEXA SCAN  Completed   COVID-19 Vaccine  Completed   Hepatitis C Screening  Completed   PNA vac Low Risk Adult  Completed   Health Maintenance Review LIFESTYLE:  Exercise:  Walk dog 3 x per day   Light housework stretches  Tobacco/ETS: no Alcohol:  no Sugar beverages:   Sleep:  5-6 if lucky  Drug use: no HH:  1  Dog  Not working  [employer] .     Hearing: ok  Vision:  No limitations at present  Cataracts and  readers  . Last eye check UTD  Safety:  Has smoke detector and wears seat belts.  No firearms. No excess sun exposure. Sees dentist regularly.  Falls:  no  Memory: Felt to be good  , no concern from her or her family.  Depression: No anhedonia unusual crying or depressive symptoms  Nutrition: Eats well balanced diet; adequate calcium and vitamin D. No swallowing chewing problems.  Injury: no major injuries in the last six months.  Other healthcare providers:  Reviewed today .  Preventive parameters: up-to-date  Reviewed   ADLS:   There are no problems or need for assistance  driving, feeding, obtaining food, dressing, toileting and bathing, managing money using phone. She is independent.  But  Joints    Some mobility limitations     ROS:  GEN/ HEENT: No fever, significant weight changes  sweats headaches vision problems hearing changes, CV/ PULM; No chest pain shortness of breath cough, syncope,edema  change in exercise tolerance. GI /GU: No adominal pain, vomiting, change in bowel habits. No blood in the stool. No significant GU symptoms. SKIN/HEME: ,no acute skin rashes suspicious lesions or bleeding. No lymphadenopathy, nodules, masses.  NEURO/ PSYCH:  No neurologic signs such as weakness numbness. No depression anxiety. IMM/ Allergy: No unusual infections.  Allergy .   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 in utero, unknown    Femur fracture, left (HCC)    Fibromyalgia    doesn't require meds   GASTRIC ULCER, ACUTE, HEMORRHAGE, HX OF 02/09/2007   GERD (gastroesophageal reflux disease)    takes Nexium daily   Goiter  H/O hiatal hernia    Hx of seasonal allergies    takes Claritin daily   HYPERLIPIDEMIA 12/29/2006   takes Lovastatin nightly   HYPERTENSION 12/29/2006   takes Enalapril daily   IBS 04/28/2007   Insomnia    related to pain;takes Flexeril and Tylenol PM nightly   Joint pain    Joint swelling    Osteopenia    Palpitations 02/09/2007   PONV (postoperative nausea and vomiting)    NAUSEA   REDUCTION MAMMOPLASTY, HX OF 02/09/2007   Right bundle branch block 02/09/2007   S/P lumbar fusion 6 13 09/03/2011   L4 L5  posterior    SYNCOPE 12/07/2007   Tendonitis    RLE    UNS ADVRS EFF OTH RX MEDICINAL\T\BIOLOGICAL SBSTNC 02/09/2007    Family History  Problem Relation Age of Onset   Diabetes Mother    Colon cancer Mother        x2   Arthritis Mother    Hypertension Mother     Endometrial cancer Mother    Stroke Father    Heart disease Father    Colon cancer Father    Arthritis Brother    Hypertension Brother    Hyperlipidemia Brother    Infertility Brother    Diabetes Brother    Other Other        DISH brother   Hypertension Maternal Grandmother    Stroke Maternal Grandmother    Anesthesia problems Neg Hx    Hypotension Neg Hx    Malignant hyperthermia Neg Hx    Pseudochol deficiency Neg Hx     Social History   Socioeconomic History   Marital status: Widowed    Spouse name: Not on file   Number of children: Not on file   Years of education: Not on file   Highest education level: Not on file  Occupational History   Not on file  Tobacco Use   Smoking status: Never Smoker   Smokeless tobacco: Never Used  Vaping Use   Vaping Use: Never used  Substance and Sexual Activity   Alcohol use: No    Alcohol/week: 0.0 standard drinks   Drug use: No   Sexual activity: Not Currently    Partners: Male    Birth control/protection: Surgical    Comment: TAH/BSO  Other Topics Concern   Not on file  Social History Narrative   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   Widow  Husband died suddenly 06/16/08   Non smoker   Social Determinants of Radio broadcast assistant Strain:    Difficulty of Paying Living Expenses: Not on file  Food Insecurity:    Worried About Charity fundraiser in the Last Year: Not on file   [place] in the Last Year: Not on file  Transportation Needs:    Lack of Transportation (Medical): Not on file   Lack of Transportation (Non-Medical): Not on file  Physical Activity:    Days of Exercise per Week: Not on file   Minutes of Exercise per Session: Not on file  Stress:    Feeling of Stress : Not on file  Social Connections:    Frequency of Communication with Friends and Family: Not on file   Frequency of Social Gatherings with Friends  and Family: Not on file   Attends Religious Services: Not on file   Active Member of Clubs or Organizations: Not on file   Attends Club  or Organization Meetings: Not on file   Marital Status: Not on file    Outpatient Encounter Medications as of 11/07/2019  Medication Sig   acetaminophen (TYLENOL) 650 MG CR tablet Take 1,300 mg by mouth See admin instructions. Take 1300 mg in the morning and may take an additional 1300 mg dose as needed for pain   amLODipine (NORVASC) 5 MG tablet TAKE 1 TABLET BY MOUTH EVERY DAY   amoxicillin (AMOXIL) 500 MG capsule Take 2,000 mg by mouth See admin instructions. Take 2000 mg 1 hour prior to dental work   Calcium Carb-Cholecalciferol (CALCIUM+D3 PO) Take 1 tablet by mouth daily.   chlorhexidine (PERIDEX) 0.12 % solution SMARTSIG:0.5-2 Ounce(s) By Mouth Twice Daily   diphenhydramine-acetaminophen (TYLENOL PM) 25-500 MG TABS tablet Take 2 tablets by mouth at bedtime.   Golimumab (SIMPONI) 50 MG/0.5ML SOAJ INJECT 50MG  (1 PEN) UNDER THE SKIN ONCE MONTHLY   losartan (COZAAR) 100 MG tablet TAKE 1 TABLET BY MOUTH EVERY DAY   lovastatin (MEVACOR) 20 MG tablet TAKE 1 TABLET BY MOUTH EVERY DAY IN THE EVENING   Methotrexate Sodium (METHOTREXATE, PF,) 50 MG/2ML injection INJECT 0.3ML INTO THE SKIN ONCE WEEKLY   Multiple Vitamins-Minerals (MULTIVITAMIN ADULTS PO) Take 1 tablet by mouth daily.    omeprazole (PRILOSEC) 40 MG capsule TAKE 1 CAPSULE BY MOUTH EVERY DAY   Tuberculin-Allergy Syringes (B-D ALLERGY SYRINGE 1CC/28G) 28G X 1/2" 1 ML MISC USE AS DIRECTED ONCE WEEKLY   No facility-administered encounter medications on file as of 11/07/2019.    EXAM:  BP 116/68    Pulse 69    Temp 97.9 F (36.6 C) (Oral)    Ht 5' 3.75" (1.619 m)    Wt 217 lb 3.2 oz (98.5 kg)    LMP 03/02/1984    SpO2 96%    BMI 37.58 kg/m   Body mass index is 37.58 kg/m.  Physical Exam: Vital signs reviewed TDH:RCBU is a well-developed well-nourished alert cooperative   who  appears stated age in no acute distress.  HEENT: normocephalic atraumatic , Eyes: PERRL EOM's full, conjunctiva clear, Nares: paten,t no deformity discharge or tenderness., Ears: no deformity EAC's clear TMs with normal landmarks. Mouth:masked NECK: supple without masses, thyromegaly or bruits. CHEST/PULM:  Clear to auscultation and percussion breath sounds equal no wheeze , rales or rhonchi. No chest wall deformities or tenderness.  midl mod kyphosis  CV: PMI is nondisplaced, S1 S2 no gallops, murmurs, rubs. Peripheral pulses are full without delay.No JVD .  ABDOMEN: Bowel sounds normal nontender  No guard or rebound, no hepato splenomegal no CVA tenderness.   Extremtities:  No clubbing cyanosis or edema, no acute joint swelling or redness.   Arthritis changes  NEURO:  Oriented x3, cranial nerves 3-12 appear to be intact, no obvious focal weakness,gait within normal limits no abnormal reflexes or asymmetrical SKIN: No acute rashes normal turgor, color, no bruising or petechiae. PSYCH: Oriented, good eye contact, no obvious depression anxiety, cognition and judgment appear normal. LN: no cervical axillary adenopathy No noted deficits in memory, attention, and speech. See bp  Readings   Lab Results  Component Value Date   WBC 5.9 10/16/2019   HGB 13.5 10/16/2019   HCT 39.9 10/16/2019   PLT 339 10/16/2019   GLUCOSE 98 10/16/2019   CHOL 200 11/26/2017   TRIG 163.0 (H) 11/26/2017   HDL 58.20 11/26/2017   LDLDIRECT 114.0 04/02/2010   LDLCALC 109 (H) 11/26/2017   ALT 26 10/16/2019   AST 23 10/16/2019  NA 138 10/16/2019   K 4.5 10/16/2019   CL 101 10/16/2019   CREATININE 0.78 10/16/2019   BUN 17 10/16/2019   CO2 28 10/16/2019   TSH 1.27 11/26/2017   INR 0.96 06/16/2017   HGBA1C 5.6 11/04/2018    ASSESSMENT AND PLAN:  Discussed the following assessment and plan:  Essential hypertension - Plan: Lipid panel, Hepatic function panel, CBC with Differential/Platelet, TSH, Iron, TIBC and  Ferritin Panel, VITAMIN D 25 Hydroxy (Vit-D Deficiency, Fractures), Vitamin B12  Medication management - Plan: Lipid panel, Hepatic function panel, CBC with Differential/Platelet, TSH, Iron, TIBC and Ferritin Panel, VITAMIN D 25 Hydroxy (Vit-D Deficiency, Fractures), Vitamin B12  Hyperlipidemia, unspecified hyperlipidemia type - Plan: Lipid panel, Hepatic function panel, CBC with Differential/Platelet, TSH, Iron, TIBC and Ferritin Panel, VITAMIN D 25 Hydroxy (Vit-D Deficiency, Fractures), Vitamin B12  Psoriatic arthritis (HCC) - Plan: Lipid panel, Hepatic function panel, CBC with Differential/Platelet, TSH, Iron, TIBC and Ferritin Panel, VITAMIN D 25 Hydroxy (Vit-D Deficiency, Fractures), Vitamin B12  Need for immunization against influenza - Plan: Flu Vaccine QUAD High Dose(Fluad)  Iron deficiency - Plan: Lipid panel, Hepatic function panel, CBC with Differential/Platelet, TSH, Iron, TIBC and Ferritin Panel, VITAMIN D 25 Hydroxy (Vit-D Deficiency, Fractures), Vitamin B12  Hyperglycemia - Plan: Lipid panel, Hepatic function panel, CBC with Differential/Platelet, TSH, Iron, TIBC and Ferritin Panel, VITAMIN D 25 Hydroxy (Vit-D Deficiency, Fractures), Vitamin B12  Neuropathy of both feet - Plan: Lipid panel, Hepatic function panel, CBC with Differential/Platelet, TSH, Iron, TIBC and Ferritin Panel, VITAMIN D 25 Hydroxy (Vit-D Deficiency, Fractures), Vitamin B12  Vitamin D deficiency - Plan: VITAMIN D 25 Hydroxy (Vit-D Deficiency, Fractures)  Need for pneumococcal vaccination - Plan: Pneumococcal polysaccharide vaccine 23-valent greater than or equal to 2yo subcutaneous/IM Pneumovax flu vaccine  Disc about covid etc   Patient Care Team: [name], MD as PCP - General Richmond Campbell, MD (Gastroenterology) Megan Salon, MD (Gynecology) Bo Merino, MD as Consulting Physician (Rheumatology) Brunetta Genera, MD as Consulting Physician (Hematology) Gaynelle Arabian, MD as  Consulting Physician (Orthopedic Surgery)  Patient Instructions  Get appt for fasting lab  appt  Glad  You ard doing ok   Continue BP control   Fu depending on results and how doing or  6-12 months    Checking b12 and vit d levels as well as iron as discussed .   Standley Brooking. Martena Emanuele M.D.

## 2019-11-07 NOTE — Patient Instructions (Signed)
Get appt for fasting lab  appt  Glad  You ard doing ok   Continue BP control   Fu depending on results and how doing or  6-12 months    Checking b12 and vit d levels as well as iron as discussed .

## 2019-11-10 ENCOUNTER — Other Ambulatory Visit: Payer: Self-pay

## 2019-11-10 ENCOUNTER — Other Ambulatory Visit: Payer: Medicare Other

## 2019-11-10 DIAGNOSIS — R739 Hyperglycemia, unspecified: Secondary | ICD-10-CM | POA: Diagnosis not present

## 2019-11-10 DIAGNOSIS — I10 Essential (primary) hypertension: Secondary | ICD-10-CM

## 2019-11-10 DIAGNOSIS — E559 Vitamin D deficiency, unspecified: Secondary | ICD-10-CM | POA: Diagnosis not present

## 2019-11-10 DIAGNOSIS — E611 Iron deficiency: Secondary | ICD-10-CM

## 2019-11-10 DIAGNOSIS — L4050 Arthropathic psoriasis, unspecified: Secondary | ICD-10-CM

## 2019-11-10 DIAGNOSIS — Z79899 Other long term (current) drug therapy: Secondary | ICD-10-CM

## 2019-11-10 DIAGNOSIS — G5793 Unspecified mononeuropathy of bilateral lower limbs: Secondary | ICD-10-CM

## 2019-11-10 DIAGNOSIS — E785 Hyperlipidemia, unspecified: Secondary | ICD-10-CM

## 2019-11-11 LAB — IRON,TIBC AND FERRITIN PANEL
%SAT: 25 % (calc) (ref 16–45)
Ferritin: 66 ng/mL (ref 16–288)
Iron: 92 ug/dL (ref 45–160)
TIBC: 361 mcg/dL (calc) (ref 250–450)

## 2019-11-11 LAB — CBC WITH DIFFERENTIAL/PLATELET
Absolute Monocytes: 383 cells/uL (ref 200–950)
Basophils Absolute: 39 cells/uL (ref 0–200)
Basophils Relative: 0.9 %
Eosinophils Absolute: 120 cells/uL (ref 15–500)
Eosinophils Relative: 2.8 %
HCT: 38.5 % (ref 35.0–45.0)
Hemoglobin: 13.1 g/dL (ref 11.7–15.5)
Lymphs Abs: 1484 cells/uL (ref 850–3900)
MCH: 32.1 pg (ref 27.0–33.0)
MCHC: 34 g/dL (ref 32.0–36.0)
MCV: 94.4 fL (ref 80.0–100.0)
MPV: 9.9 fL (ref 7.5–12.5)
Monocytes Relative: 8.9 %
Neutro Abs: 2275 cells/uL (ref 1500–7800)
Neutrophils Relative %: 52.9 %
Platelets: 309 10*3/uL (ref 140–400)
RBC: 4.08 10*6/uL (ref 3.80–5.10)
RDW: 13.1 % (ref 11.0–15.0)
Total Lymphocyte: 34.5 %
WBC: 4.3 10*3/uL (ref 3.8–10.8)

## 2019-11-11 LAB — VITAMIN B12: Vitamin B-12: 521 pg/mL (ref 200–1100)

## 2019-11-11 LAB — HEPATIC FUNCTION PANEL
AG Ratio: 1.6 (calc) (ref 1.0–2.5)
ALT: 19 U/L (ref 6–29)
AST: 18 U/L (ref 10–35)
Albumin: 4.5 g/dL (ref 3.6–5.1)
Alkaline phosphatase (APISO): 99 U/L (ref 37–153)
Bilirubin, Direct: 0.2 mg/dL (ref 0.0–0.2)
Globulin: 2.9 g/dL (calc) (ref 1.9–3.7)
Indirect Bilirubin: 0.8 mg/dL (calc) (ref 0.2–1.2)
Total Bilirubin: 1 mg/dL (ref 0.2–1.2)
Total Protein: 7.4 g/dL (ref 6.1–8.1)

## 2019-11-11 LAB — LIPID PANEL
Cholesterol: 184 mg/dL (ref <200)
HDL: 65 mg/dL (ref >=50)
LDL Cholesterol (Calc): 94 mg/dL (calc)
Non-HDL Cholesterol (Calc): 119 mg/dL (calc) (ref <130)
Total CHOL/HDL Ratio: 2.8 (calc) (ref <5.0)
Triglycerides: 152 mg/dL — ABNORMAL HIGH (ref <150)

## 2019-11-11 LAB — VITAMIN D 25 HYDROXY (VIT D DEFICIENCY, FRACTURES): Vit D, 25-Hydroxy: 35 ng/mL (ref 30–100)

## 2019-11-11 LAB — TSH: TSH: 1.19 mIU/L (ref 0.40–4.50)

## 2019-11-13 NOTE — Progress Notes (Signed)
Results look good  x slight tg elevation  as in past . Forwarding info to your care team.

## 2019-11-13 NOTE — Progress Notes (Signed)
Thank you :)

## 2019-11-14 ENCOUNTER — Other Ambulatory Visit: Payer: Self-pay

## 2019-11-14 ENCOUNTER — Ambulatory Visit
Admission: RE | Admit: 2019-11-14 | Discharge: 2019-11-14 | Disposition: A | Discharge status: Home / Self Care | Payer: Medicare Other | Source: Ambulatory Visit | Attending: Obstetrics & Gynecology | Admitting: Obstetrics & Gynecology

## 2019-11-14 DIAGNOSIS — Z1231 Encounter for screening mammogram for malignant neoplasm of breast: Secondary | ICD-10-CM

## 2019-11-21 DIAGNOSIS — D2261 Melanocytic nevi of right upper limb, including shoulder: Secondary | ICD-10-CM | POA: Diagnosis not present

## 2019-11-21 DIAGNOSIS — L821 Other seborrheic keratosis: Secondary | ICD-10-CM | POA: Diagnosis not present

## 2019-11-21 DIAGNOSIS — D2262 Melanocytic nevi of left upper limb, including shoulder: Secondary | ICD-10-CM | POA: Diagnosis not present

## 2019-11-21 DIAGNOSIS — D1801 Hemangioma of skin and subcutaneous tissue: Secondary | ICD-10-CM | POA: Diagnosis not present

## 2019-11-21 DIAGNOSIS — D2221 Melanocytic nevi of right ear and external auricular canal: Secondary | ICD-10-CM | POA: Diagnosis not present

## 2019-11-21 DIAGNOSIS — D485 Neoplasm of uncertain behavior of skin: Secondary | ICD-10-CM | POA: Diagnosis not present

## 2019-11-21 DIAGNOSIS — D2272 Melanocytic nevi of left lower limb, including hip: Secondary | ICD-10-CM | POA: Diagnosis not present

## 2019-11-21 DIAGNOSIS — L738 Other specified follicular disorders: Secondary | ICD-10-CM | POA: Diagnosis not present

## 2019-11-21 DIAGNOSIS — L814 Other melanin hyperpigmentation: Secondary | ICD-10-CM | POA: Diagnosis not present

## 2019-11-21 DIAGNOSIS — L820 Inflamed seborrheic keratosis: Secondary | ICD-10-CM | POA: Diagnosis not present

## 2019-12-08 ENCOUNTER — Encounter: Payer: Self-pay | Admitting: Obstetrics & Gynecology

## 2019-12-19 ENCOUNTER — Telehealth: Payer: Self-pay | Admitting: Pharmacy Technician

## 2019-12-19 NOTE — Telephone Encounter (Signed)
Received fax from [facility], application has been APPROVED. Coverage is from 12/18/19 to 03/01/21.   Phone# [phone] Fax# [phone]

## 2020-01-04 ENCOUNTER — Ambulatory Visit (INDEPENDENT_AMBULATORY_CARE_PROVIDER_SITE_OTHER): Payer: Medicare Other | Admitting: Podiatry

## 2020-01-04 ENCOUNTER — Ambulatory Visit (INDEPENDENT_AMBULATORY_CARE_PROVIDER_SITE_OTHER): Payer: Medicare Other

## 2020-01-04 ENCOUNTER — Other Ambulatory Visit: Payer: Self-pay

## 2020-01-04 DIAGNOSIS — M779 Enthesopathy, unspecified: Secondary | ICD-10-CM

## 2020-01-04 DIAGNOSIS — M7751 Other enthesopathy of right foot: Secondary | ICD-10-CM

## 2020-01-04 DIAGNOSIS — L600 Ingrowing nail: Secondary | ICD-10-CM

## 2020-01-04 DIAGNOSIS — G629 Polyneuropathy, unspecified: Secondary | ICD-10-CM

## 2020-01-10 NOTE — Progress Notes (Signed)
Subjective: 69 year old female presents the office today with multiple concerns.  She states that she has been diagnosed with neuropathy by her rheumatologist but she stopped and started any medication for this.  She has an occasional numbness and tingling.  She does get some discomfort as well as warmth of the right foot.  She denies any recent injury or trauma.  She has some discomfort to the area.  She also states that on the left big toenail thickened nails 1 to grow back the ingrown toenail procedure is at that was trimmed out.  She occasionally has spasming of big toe as well but no recent injury.  No swelling. Denies any systemic complaints such as fevers, chills, nausea, vomiting. No acute changes since last appointment, and no other complaints at this time.   Objective: AAO x3, NAD DP/PT pulses palpable bilaterally, CRT less than 3 seconds Sensation appears to be intact with Semmes Weinstein monofilament but slightly decreased in the digits. There is tenderness along the right foot and sinus tarsi and there is as well as some localized edema.  No erythema or warmth.  There is no areas of pinpoint tenderness identified at this time. Spicule of nail present along the left hallux nail border.  No edema, erythema.  No pain the toe itself. No pain with calf compression, swelling, warmth, erythema  Assessment: Right foot capsulitis; neuropathy; ingrown toenail  Plan: -All treatment options discussed with the patient including all alternatives, risks, complications.  -X-rays obtained reviewed of the right foot.  No evidence of acute fracture. -Steroid injection performed the right subtalar joint.  See procedure note below. -Discussed treatment options for neuropathy.  We will hold off any medications for now. -Debrided the loose piece of toenail on the left hallux nail without any complications or bleeding. -No pain to the toe today.  Will monitor for the spasms. -Patient encouraged to call  the office with any questions, concerns, change in symptoms.   Procedure: Injection intermediate joint Discussed alternatives, risks, complications and verbal consent was obtained.  Location: Right sinus tarsi Skin Prep: Betadine Injectate: 0.5cc 0.5% marcaine plain, 0.5 cc 2% lidocaine plain and, 1 cc kenalog 10. Disposition: Patient tolerated procedure well. Injection site dressed with a band-aid.  Post-injection care was discussed and return precautions discussed.    Trula Slade DPM

## 2020-01-22 ENCOUNTER — Other Ambulatory Visit: Payer: Self-pay | Admitting: Rheumatology

## 2020-01-22 NOTE — Telephone Encounter (Signed)
Last Visit: 10/16/2019 Next Visit: 03/20/2020  Okay to refill per Dr. Estanislado Pandy

## 2020-01-31 ENCOUNTER — Other Ambulatory Visit: Payer: Self-pay | Admitting: *Deleted

## 2020-01-31 MED ORDER — SIMPONI 50 MG/0.5ML ~~LOC~~ SOAJ
SUBCUTANEOUS | 0 refills | Status: DC
Start: 2020-01-31 — End: 2020-05-10

## 2020-01-31 NOTE — Telephone Encounter (Signed)
Refill request received via fax  Last Visit:10/16/2019 Next Visit:03/20/2020 Labs: 11/10/2019 CBC and Hepatic Function Panel WNL, 10/16/2019 CMP WNL Tb Gold: 10/16/2019 Neg   Patient advised she is due to update labs. Patient states she will come into the office next week to update.   Okay to refill Simponi?

## 2020-02-02 ENCOUNTER — Other Ambulatory Visit: Payer: Self-pay | Admitting: *Deleted

## 2020-02-02 DIAGNOSIS — Z79899 Other long term (current) drug therapy: Secondary | ICD-10-CM

## 2020-02-02 LAB — COMPLETE METABOLIC PANEL WITH GFR
AG Ratio: 1.7 (calc) (ref 1.0–2.5)
ALT: 24 U/L (ref 6–29)
AST: 24 U/L (ref 10–35)
Albumin: 4.4 g/dL (ref 3.6–5.1)
Alkaline phosphatase (APISO): 93 U/L (ref 37–153)
BUN: 19 mg/dL (ref 7–25)
CO2: 27 mmol/L (ref 20–32)
Calcium: 9.7 mg/dL (ref 8.6–10.4)
Chloride: 106 mmol/L (ref 98–110)
Creat: 0.81 mg/dL (ref 0.50–0.99)
GFR, Est African American: 87 mL/min/{1.73_m2} (ref >=60)
GFR, Est Non African American: 75 mL/min/{1.73_m2} (ref >=60)
Globulin: 2.6 g/dL (calc) (ref 1.9–3.7)
Glucose, Bld: 148 mg/dL — ABNORMAL HIGH (ref 65–99)
Potassium: 4.6 mmol/L (ref 3.5–5.3)
Sodium: 141 mmol/L (ref 135–146)
Total Bilirubin: 0.5 mg/dL (ref 0.2–1.2)
Total Protein: 7 g/dL (ref 6.1–8.1)

## 2020-02-02 LAB — CBC WITH DIFFERENTIAL/PLATELET
Absolute Monocytes: 330 cells/uL (ref 200–950)
Basophils Absolute: 48 cells/uL (ref 0–200)
Basophils Relative: 0.8 %
Eosinophils Absolute: 72 cells/uL (ref 15–500)
Eosinophils Relative: 1.2 %
HCT: 35.4 % (ref 35.0–45.0)
Hemoglobin: 12.3 g/dL (ref 11.7–15.5)
Lymphs Abs: 2040 cells/uL (ref 850–3900)
MCH: 32.5 pg (ref 27.0–33.0)
MCHC: 34.7 g/dL (ref 32.0–36.0)
MCV: 93.7 fL (ref 80.0–100.0)
MPV: 10.7 fL (ref 7.5–12.5)
Monocytes Relative: 5.5 %
Neutro Abs: 3510 cells/uL (ref 1500–7800)
Neutrophils Relative %: 58.5 %
Platelets: 336 10*3/uL (ref 140–400)
RBC: 3.78 10*6/uL — ABNORMAL LOW (ref 3.80–5.10)
RDW: 13.7 % (ref 11.0–15.0)
Total Lymphocyte: 34 %
WBC: 6 10*3/uL (ref 3.8–10.8)

## 2020-02-03 NOTE — Progress Notes (Signed)
CBC is normal.  Glucose is elevated.  Please notify patient.

## 2020-03-06 NOTE — Progress Notes (Signed)
Office Visit Note  Patient: Jade Cardenas             Date of Birth: 11/07/1950           MRN: [id number]             PCP: [name], MD Referring: [name], MD Visit Date: 03/20/2020 Occupation: @GUAROCC @  Subjective:  Medication management.   History of Present Illness: Jade Cardenas is a 70 y.o. female history of psoriatic arthritis, osteoarthritis, degenerative disc disease and psoriasis.  She states she continues to have some discomfort in her joints but no joint swelling.  She has been taking Simponi injections on a regular basis and also using methotrexate subcutaneous injections.  She denies any psoriasis.  He continues to have discomfort in her knee joints and ankle joints.  Activities of Daily Living:  Patient reports morning stiffness for 1 hour.   Patient Reports nocturnal pain.  Difficulty dressing/grooming: Denies Difficulty climbing stairs: Reports Difficulty getting out of chair: Reports Difficulty using hands for taps, buttons, cutlery, and/or writing: Reports  Review of Systems  Constitutional: Positive for fatigue.  HENT: Positive for nosebleeds, mouth dryness and nose dryness. Negative for mouth sores.   Eyes: Positive for dryness. Negative for pain, itching and visual disturbance.  Respiratory: Negative for cough, hemoptysis, shortness of breath and difficulty breathing.   Cardiovascular: Negative for chest pain, palpitations and swelling in legs/feet.  Gastrointestinal: Negative for abdominal pain, blood in stool, constipation and diarrhea.  Endocrine: Negative for increased urination.  Genitourinary: Negative for painful urination.  Musculoskeletal: Positive for arthralgias, joint pain, myalgias, morning stiffness, muscle tenderness and myalgias. Negative for joint swelling and muscle weakness.  Skin: Negative for color change, rash and redness.  Allergic/Immunologic: Negative for susceptible to infections.  Neurological: Negative for  dizziness, numbness, headaches, memory loss and weakness.  Hematological: Negative for swollen glands.  Psychiatric/Behavioral: Negative for confusion and sleep disturbance.    PMFS History:  Patient Active Problem List   Diagnosis Date Noted  . History of total knee replacement, left 06/28/2017  . OA (osteoarthritis) of knee 06/21/2017  . Varicose veins of bilateral lower extremities with other complications [id number]  . DDD (degenerative disc disease), lumbar 10/19/2016  . Goiter 08/28/2016  . Family history of malignant neoplasm of digestive organ 08/27/2016  . 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 Fibromyalgia 05/17/2016  . High risk medication use 02/18/2016  . DJD (degenerative joint disease), cervical 02/18/2016  . Heel spur 09/29/2015  . Ingrown toenail 09/29/2015  . Foraminal stenosis of cervical region 07/30/2015  . Microcytic hypochromic anemia 05/22/2015  . Tailor's bunion, left 01/18/2015  . H/O hiatal hernia   . Bilateral primary osteoarthritis of knee 12/15/2013  . Iron deficiency 12/15/2013  . Psoriatic arthritis ([place]) 12/15/2013  . History of foot fracture 12/15/2013  . Dermatitis 10/17/2013  . Arthritis 05/31/2013  . 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 Skin lesion 12/09/2012  . Tinnitus 06/23/2012  . Scalp lesion 01/21/2012  . Headache(784.0) 01/21/2012  . Hx of psoriasis 01/21/2012  . Weight gain 01/21/2012  . Ankylosing spondylitis  possible 09/03/2011  . Hx of transfusion 09/03/2011  . S/P lumbar fusion 6 13 09/03/2011  . Lumbar stenosis with neurogenic claudication 08/05/2011  . LBP (low back pain) 01/17/2011  . ALLERGIC RHINITIS 02/19/2010  . GERD 02/19/2010  . ARTHRITIS 02/19/2010  . SPINAL STENOSIS, CERVICAL 02/19/2010  . OSTEOPENIA 02/19/2010  . RASH AND OTHER NONSPECIFIC SKIN ERUPTION  12/07/2007  . IBS 04/28/2007  . History of gastric ulcer 02/09/2007  . COLONIC POLYPS, HX OF 02/09/2007  . CHOLECYSTECTOMY, HX  OF 02/09/2007  . Hyperlipidemia 12/29/2006  . Essential hypertension 12/29/2006    Past Medical History:  Diagnosis Date  . Anemia    iron def  . Arthritis    rheumatoid, psoriatic, osteoarthritis   . Bone spur    RIGHT FOOT   . Broken toe   . Bruises easily   . Cellulitis   . Chronic back pain   . COLONIC POLYPS, HX OF 02/09/2007  . Cough, persistent 01/21/2012   poss from acei   . 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 Femur fracture, left (HCC)   . Fibromyalgia    doesn't require meds  . GASTRIC ULCER, ACUTE, HEMORRHAGE, HX OF 02/09/2007  . GERD (gastroesophageal reflux disease)    takes Nexium daily  . Goiter   . H/O hiatal hernia   . Hx of seasonal allergies    takes Claritin daily  . HYPERLIPIDEMIA 12/29/2006   takes Lovastatin nightly  . HYPERTENSION 12/29/2006   takes Enalapril daily  . IBS 04/28/2007  . Insomnia    related to pain;takes Flexeril and Tylenol PM nightly  . Joint pain   . Joint swelling   . Osteopenia   . Palpitations 02/09/2007  . PONV (postoperative nausea and vomiting)    NAUSEA  . REDUCTION MAMMOPLASTY, HX OF 02/09/2007  . Right bundle branch block 02/09/2007  . S/P lumbar fusion 6 13 09/03/2011   L4 L5  posterior   . SYNCOPE 12/07/2007  . Tendonitis    RLE   . UNS ADVRS EFF OTH RX MEDICINAL\T\BIOLOGICAL SBSTNC 02/09/2007    Family History  Problem Relation Age of Onset  . Diabetes Mother   . Colon cancer Mother        x2  . Arthritis Mother   . Hypertension Mother   . Endometrial cancer Mother   . Stroke Father   . Heart disease Father   . Colon cancer Father   . Arthritis Brother   . Hypertension Brother   . Hyperlipidemia Brother   . Infertility Brother   . Diabetes Brother   . Other Other        DISH brother  . Hypertension Maternal Grandmother   . Stroke Maternal Grandmother   . Anesthesia problems Neg Hx   . Hypotension Neg Hx   . Malignant hyperthermia Neg Hx   . Pseudochol deficiency Neg Hx    Past Surgical  History:  Procedure Laterality Date  . BACK SURGERY  12+yrs ago   Synovial Cyst removal  . BACK SURGERY  6/13   lumbar L4-L5 fusion, lamenectomy  . bone spur  6-60yrs ago   right ankle  . BUNIONECTOMY  10/03/14   right foot  . CERVICAL FUSION     2 12   . CHOLECYSTECTOMY  1996  . HARDWARE REMOVAL Left 08/01/2018   Procedure: Left knee hardware removal;  Surgeon: Ollen Gross, MD;  Location: [facility];  Service: Orthopedics;  Laterality: Left;   . JOINT REPLACEMENT    . KNEE ARTHROPLASTY    . POSTERIOR CERVICAL LAMINECTOMY Left 07/30/2015   Procedure: Laminectomy and Foraminotomy - left - Cervical two -Cervical three;  Surgeon: Julio Sicks, MD;  Location: [facility];  Service: Neurosurgery;  Laterality: Left;  . REDUCTION MAMMAPLASTY Bilateral 1989  . TONSILLECTOMY     as a child  . TOTAL ABDOMINAL  HYSTERECTOMY W/ BILATERAL SALPINGOOPHORECTOMY  1986  . 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 Fluad Quad(high Dose 65+) 11/04/2018, 11/07/2019  . Influenza Split 11/29/2012, 11/27/2013  . Influenza Whole 11/09/2008  . Influenza, High Dose Seasonal PF 11/26/2015, 11/30/2016, 11/03/2017  . Influenza,inj,Quad PF,6+ Mos 11/27/2014  . Influenza-Unspecified 12/22/2016  . PFIZER(Purple Top)SARS-COV-2 Vaccination 04/08/2019, 05/03/2019, 10/19/2019  . PPD Test 12/18/2013, 11/27/2014, 11/26/2015, 11/30/2016  . Pneumococcal Conjugate-13 02/28/2013  . Pneumococcal Polysaccharide-23 03/03/2003, 02/28/2014, 11/07/2019  . Td 05/09/2008  . Tdap 07/19/2018  . Zoster 01/20/2011  . Zoster  Recombinat (Shingrix) 12/03/2016, 02/04/2017     Objective: Vital Signs: BP 135/80 (BP Location: Left Arm, Patient Position: Sitting, Cuff Size: Large)   Pulse 73   Ht 5\' 4"  (1.626 m)   Wt 216 lb (98 kg)   LMP 03/02/1984   BMI 37.08 kg/m    Physical Exam Vitals and nursing note reviewed.  Constitutional:      Appearance: She is well-developed and well-nourished.  HENT:     Head: Normocephalic and atraumatic.  Eyes:     Extraocular Movements: EOM normal.     Conjunctiva/sclera: Conjunctivae normal.  Cardiovascular:     Rate and Rhythm: Normal rate and regular rhythm.     Pulses: Intact distal pulses.     Heart sounds: Normal heart sounds.  Pulmonary:     Effort: Pulmonary effort is normal.     Breath sounds: Normal breath sounds.  Abdominal:     General: Bowel sounds are normal.     Palpations: Abdomen is soft.  Musculoskeletal:     Cervical back: Normal range of motion.  Lymphadenopathy:     Cervical: No cervical adenopathy.  Skin:    General: Skin is warm and dry.     Capillary Refill: Capillary refill takes less than 2 seconds.  Neurological:     Mental Status: She is alert and oriented to person, place, and time.  Psychiatric:        Mood and Affect: Mood and affect normal.        Behavior: Behavior normal.      Musculoskeletal Exam: She has limited range of motion of her cervical spine.  Shoulder joints, elbow joints, wrist joints, MCPs PIPs and DIPs with good range of motion with no synovitis.  She had good range of motion of her hip joints.  She has discomfort in her left knee joint which is replaced.  Right knee joint was in good range of motion.  There was no tenderness over ankles or MTPs.  CDAI Exam: CDAI Score: -- Patient Global: --; Provider Global: -- Swollen: --; Tender: -- Joint Exam 03/20/2020   No joint exam has been documented for this visit   There is currently no information documented on the homunculus. Go to the Rheumatology activity and  complete the homunculus joint exam.  Investigation: No additional findings.  Imaging: No results found.  Recent Labs: Lab Results  Component Value Date   WBC 6.0 02/02/2020   HGB 12.3 02/02/2020   PLT 336 02/02/2020   NA 141 02/02/2020  K 4.6 02/02/2020   CL 106 02/02/2020   CO2 27 02/02/2020   GLUCOSE 148 (H) 02/02/2020   BUN 19 02/02/2020   CREATININE 0.81 02/02/2020   BILITOT 0.5 02/02/2020   ALKPHOS 113 07/24/2019   AST 24 02/02/2020   ALT 24 02/02/2020   PROT 7.0 02/02/2020   ALBUMIN 4.6 07/24/2019   CALCIUM 9.7 02/02/2020   GFRAA 87 02/02/2020   QFTBGOLDPLUS NEGATIVE 10/16/2019    Speciality Comments: Failed therapy: Arava and Otezla due to diarrhea  Procedures:  No procedures performed Allergies: Erythromycin, Iodinated diagnostic agents, Ace inhibitors, Keflex [cephalexin], and Macrobid [nitrofurantoin]   Assessment / Plan:     Visit Diagnoses: Psoriatic arthritis (HCC)-patient had no synovitis on my examination.  She is doing well on combination of Simponi and methotrexate.  Hx of psoriasis-she has no active psoriasis lesions.  High risk medication use - Simponi 50 mg every 28 days and methotrexate 0.3 ml every 7 days.Ranae Plumber and [place] caused diarrhea).  Labs are stable.  We will continue to monitor labs every 3 months.  TB Gold October 16, 2019 was normal.  She is fully vaccinated against COVID-19.  She had 3 shots and would like to get a booster in the future.  Have advised her to hold methotrexate for 1 week after the booster.  Use of mask, social distancing and hand hygiene was emphasized.  She was also advised to hold Simponi and methotrexate in case she develops COVID-19 infection.  Primary osteoarthritis of right knee - moderate osteoarthritis and severe chondromalacia patella.  She uses a cane for mobility.  Total knee replacement status, left - by Dr. Lequita Halt.  She continues to have discomfort in her left knee joint.  DDD (degenerative disc disease),  cervical - S/P fusion.  She has limited range of motion.  DDD (degenerative disc disease), lumbar - S/P fusion.  She has chronic discomfort and limited range of motion.  Fibromyalgia-she has some generalized pain from fibromyalgia.  Chronic fatigue  Osteopenia of multiple sites - DEXA 09/15/17 osteopenia T -1.8.  Followed by her GYN.  She will get repeat DEXA scan with Dr. Hyacinth Meeker.  Other iron deficiency anemia -patient is concerned about iron deficiency and would like to have repeat labs.  Plan: Iron, TIBC and Ferritin Panel  Orders: Orders Placed This Encounter  Procedures  . Iron, TIBC and Ferritin Panel   No orders of the defined types were placed in this encounter.    Follow-Up Instructions: Return in about 5 months (around 08/18/2020) for Psoriatic arthritis, Osteoarthritis.   Pollyann Savoy, MD  Note - This record has been created using Animal nutritionist.  Chart creation errors have been sought, but may not always  have been located. Such creation errors do not reflect on  the standard of medical care.

## 2020-03-07 ENCOUNTER — Other Ambulatory Visit: Payer: Self-pay

## 2020-03-07 ENCOUNTER — Ambulatory Visit (INDEPENDENT_AMBULATORY_CARE_PROVIDER_SITE_OTHER): Payer: Medicare Other | Admitting: Podiatry

## 2020-03-07 DIAGNOSIS — M79671 Pain in right foot: Secondary | ICD-10-CM | POA: Diagnosis not present

## 2020-03-07 DIAGNOSIS — D3610 Benign neoplasm of peripheral nerves and autonomic nervous system, unspecified: Secondary | ICD-10-CM

## 2020-03-07 DIAGNOSIS — G5761 Lesion of plantar nerve, right lower limb: Secondary | ICD-10-CM

## 2020-03-07 MED ORDER — TRIAMCINOLONE ACETONIDE 10 MG/ML IJ SUSP
10.0000 mg | Freq: Once | INTRAMUSCULAR | Status: AC
  Administered 2020-03-07: 10 mg

## 2020-03-11 ENCOUNTER — Telehealth: Payer: Self-pay | Admitting: Internal Medicine

## 2020-03-11 NOTE — Telephone Encounter (Signed)
Left message for patient to call back and schedule Medicare Annual Wellness Visit (AWV) either virtually or in office.   Last AWV no answer   please schedule at anytime with [facility] Nurse Health Advisor 1 or 2   This should be a 45 minute visit.

## 2020-03-13 NOTE — Progress Notes (Signed)
Subjective: 70 year old female presents the office today for follow evaluation of right foot pain.  She states that she has pain to the forefoot pointing along the third intermetatarsal space.  She does occasionally some numbness, sharp pain to the area as well.  Denies any recent injury or trauma.  She has no other concerns today.  No recent injury. Denies any systemic complaints such as fevers, chills, nausea, vomiting. No acute changes since last appointment, and no other complaints at this time.   Objective: AAO x3, NAD DP/PT pulses palpable bilaterally, CRT less than 3 seconds Tenderness palpation on the right third interspace a small palpable neuroma is identified.  There is no area of pinpoint tenderness.  Minimal edema.  No erythema or warmth.  No other areas of discomfort identified today.  No pain with calf compression, swelling, warmth, erythema  Assessment: Right foot likely neuroma  Plan: -All treatment options discussed with the patient including all alternatives, risks, complications.  -Steroid injection performed.  See procedure note below.  Neuroma pad was dispensed.  Discussed shoe modifications. -Patient encouraged to call the office with any questions, concerns, change in symptoms.   Procedure: Injection Morton's neuroma Discussed alternatives, risks, complications and verbal consent was obtained.  Location: Right 3rd interspace Skin Prep: Alcohol  Injectate: 0.5cc 0.5% marcaine plain, 0.5 cc 2% lidocaine plain and, 1 cc kenalog 10. Disposition: Patient tolerated procedure well. Injection site dressed with a band-aid.  Post-injection care was discussed and return precautions discussed.   Return in about 6 weeks (around 04/18/2020).  Trula Slade DPM

## 2020-03-13 NOTE — Telephone Encounter (Signed)
So no complete data on  4th booster   ( not FDA approved)  They are trying in [place].    probably not harmful vs possible benefit in your situation . You can ask  Dr D   But I would wait until  About 6 months   In case  More data  Helpful  .  Current vaccine Not that great against omicron    . Would have liked an udated verson by now   Your choice   dont see a reason to change appt.

## 2020-03-20 ENCOUNTER — Encounter: Payer: Self-pay | Admitting: Rheumatology

## 2020-03-20 ENCOUNTER — Other Ambulatory Visit: Payer: Self-pay

## 2020-03-20 ENCOUNTER — Ambulatory Visit (INDEPENDENT_AMBULATORY_CARE_PROVIDER_SITE_OTHER): Payer: Medicare Other | Admitting: Rheumatology

## 2020-03-20 VITALS — BP 135/80 | HR 73 | Ht 64.0 in | Wt 216.0 lb

## 2020-03-20 DIAGNOSIS — L4050 Arthropathic psoriasis, unspecified: Secondary | ICD-10-CM | POA: Diagnosis not present

## 2020-03-20 DIAGNOSIS — M5030 Other cervical disc degeneration, unspecified cervical region: Secondary | ICD-10-CM

## 2020-03-20 DIAGNOSIS — M51369 Other intervertebral disc degeneration, lumbar region without mention of lumbar back pain or lower extremity pain: Secondary | ICD-10-CM

## 2020-03-20 DIAGNOSIS — Z96652 Presence of left artificial knee joint: Secondary | ICD-10-CM

## 2020-03-20 DIAGNOSIS — R5382 Chronic fatigue, unspecified: Secondary | ICD-10-CM | POA: Diagnosis not present

## 2020-03-20 DIAGNOSIS — M5136 Other intervertebral disc degeneration, lumbar region: Secondary | ICD-10-CM

## 2020-03-20 DIAGNOSIS — M797 Fibromyalgia: Secondary | ICD-10-CM | POA: Diagnosis not present

## 2020-03-20 DIAGNOSIS — Z79899 Other long term (current) drug therapy: Secondary | ICD-10-CM | POA: Diagnosis not present

## 2020-03-20 DIAGNOSIS — M1711 Unilateral primary osteoarthritis, right knee: Secondary | ICD-10-CM

## 2020-03-20 DIAGNOSIS — D508 Other iron deficiency anemias: Secondary | ICD-10-CM

## 2020-03-20 DIAGNOSIS — M8589 Other specified disorders of bone density and structure, multiple sites: Secondary | ICD-10-CM

## 2020-03-20 DIAGNOSIS — Z872 Personal history of diseases of the skin and subcutaneous tissue: Secondary | ICD-10-CM | POA: Diagnosis not present

## 2020-03-20 NOTE — Patient Instructions (Signed)
Standing Labs We placed an order today for your standing lab work.   Please have your standing labs drawn in March and every 3 months    If possible, please have your labs drawn 2 weeks prior to your appointment so that the provider can discuss your results at your appointment.  We have open lab daily Monday through Thursday from 8:30-12:30 PM and 1:30-4:30 PM and Friday from 8:30-12:30 PM and 1:30-4:00 PM at the office of Dr. Aalaya Yadao, [facility].   Please be advised, patients with office appointments requiring lab work will take precedents over walk-in lab work.  If possible, please come for your lab work on Monday and Friday afternoons, as you may experience shorter wait times. The office is located at [address], [address], [place], [place] [address] No appointment is necessary.   Labs are drawn by [facility]. Please bring your co-pay at the time of your lab draw.  You may receive a bill from [facility] for your lab work.  If you wish to have your labs drawn at another location, please call the office 24 hours in advance to send orders.  If you have any questions regarding directions or hours of operation,  please call [phone].   As a reminder, please drink plenty of water prior to coming for your lab work. Thanks!   

## 2020-03-21 ENCOUNTER — Encounter: Payer: Self-pay | Admitting: Hematology

## 2020-03-21 LAB — IRON,TIBC AND FERRITIN PANEL
%SAT: 9 % (calc) — ABNORMAL LOW (ref 16–45)
Ferritin: 19 ng/mL (ref 16–288)
Iron: 35 ug/dL — ABNORMAL LOW (ref 45–160)
TIBC: 395 mcg/dL (calc) (ref 250–450)

## 2020-03-21 NOTE — Progress Notes (Signed)
Please notify patient that her iron levels have declined.  She may contact Dr.Kale.  Please forward labs to Dr. Irene Limbo.

## 2020-03-28 ENCOUNTER — Other Ambulatory Visit: Payer: Self-pay | Admitting: *Deleted

## 2020-03-28 ENCOUNTER — Other Ambulatory Visit: Payer: Self-pay | Admitting: Hematology

## 2020-03-28 DIAGNOSIS — D509 Iron deficiency anemia, unspecified: Secondary | ICD-10-CM

## 2020-03-28 DIAGNOSIS — Z23 Encounter for immunization: Secondary | ICD-10-CM | POA: Diagnosis not present

## 2020-03-29 ENCOUNTER — Telehealth: Payer: Self-pay | Admitting: Hematology

## 2020-03-29 NOTE — Telephone Encounter (Signed)
Scheduled appt per 1/26 sch msg - pt is aware of appt

## 2020-04-03 NOTE — Telephone Encounter (Signed)
Hope you will be feeling better also . Thanks for the update

## 2020-04-08 ENCOUNTER — Other Ambulatory Visit: Payer: Self-pay

## 2020-04-08 ENCOUNTER — Inpatient Hospital Stay: Payer: Medicare Other | Attending: Hematology

## 2020-04-08 VITALS — BP 157/63 | HR 69 | Temp 98.4°F | Resp 18 | Wt 216.5 lb

## 2020-04-08 DIAGNOSIS — D509 Iron deficiency anemia, unspecified: Secondary | ICD-10-CM | POA: Diagnosis not present

## 2020-04-08 DIAGNOSIS — Z79899 Other long term (current) drug therapy: Secondary | ICD-10-CM | POA: Diagnosis not present

## 2020-04-08 DIAGNOSIS — E611 Iron deficiency: Secondary | ICD-10-CM

## 2020-04-08 MED ORDER — SODIUM CHLORIDE 0.9 % IV SOLN
Freq: Once | INTRAVENOUS | Status: AC
  Filled 2020-04-08: qty 250

## 2020-04-08 MED ORDER — ACETAMINOPHEN 325 MG PO TABS: 650.0000 mg | ORAL_TABLET | Freq: Once | ORAL | Status: DC

## 2020-04-08 MED ORDER — FERUMOXYTOL INJECTION 510 MG/17 ML
510.0000 mg | Freq: Once | INTRAVENOUS | Status: AC
  Administered 2020-04-08: 510 mg via INTRAVENOUS
  Filled 2020-04-08: qty 510

## 2020-04-08 MED ORDER — LORATADINE 10 MG PO TABS: 10.0000 mg | ORAL_TABLET | Freq: Once | ORAL | Status: DC

## 2020-04-08 NOTE — Patient Instructions (Signed)

## 2020-04-15 ENCOUNTER — Other Ambulatory Visit: Payer: Self-pay

## 2020-04-15 ENCOUNTER — Inpatient Hospital Stay: Payer: Medicare Other

## 2020-04-15 VITALS — BP 123/59 | HR 74 | Temp 97.9°F | Resp 18

## 2020-04-15 DIAGNOSIS — D509 Iron deficiency anemia, unspecified: Secondary | ICD-10-CM | POA: Diagnosis not present

## 2020-04-15 DIAGNOSIS — E611 Iron deficiency: Secondary | ICD-10-CM

## 2020-04-15 DIAGNOSIS — Z79899 Other long term (current) drug therapy: Secondary | ICD-10-CM | POA: Diagnosis not present

## 2020-04-15 MED ORDER — SODIUM CHLORIDE 0.9 % IV SOLN
Freq: Once | INTRAVENOUS | Status: AC
  Filled 2020-04-15: qty 250

## 2020-04-15 MED ORDER — SODIUM CHLORIDE 0.9 % IV SOLN
510.0000 mg | Freq: Once | INTRAVENOUS | Status: AC
  Administered 2020-04-15: 510 mg via INTRAVENOUS
  Filled 2020-04-15: qty 510

## 2020-04-15 NOTE — Patient Instructions (Signed)

## 2020-04-15 NOTE — Progress Notes (Signed)
Patient was observed for 30 minutes post iron infusion with no complaints. Vitals stable upon departure and patient with no complaints.

## 2020-04-16 ENCOUNTER — Other Ambulatory Visit: Payer: Self-pay | Admitting: Internal Medicine

## 2020-04-17 ENCOUNTER — Other Ambulatory Visit: Payer: Self-pay

## 2020-04-17 ENCOUNTER — Other Ambulatory Visit (INDEPENDENT_AMBULATORY_CARE_PROVIDER_SITE_OTHER): Payer: Medicare Other

## 2020-04-17 DIAGNOSIS — R3989 Other symptoms and signs involving the genitourinary system: Secondary | ICD-10-CM

## 2020-04-17 LAB — URINALYSIS, ROUTINE W REFLEX MICROSCOPIC
Bilirubin Urine: NEGATIVE
Hgb urine dipstick: NEGATIVE
Ketones, ur: NEGATIVE
Leukocytes,Ua: NEGATIVE
Nitrite: NEGATIVE
RBC / HPF: NONE SEEN (ref 0–?)
Specific Gravity, Urine: 1.025 (ref 1.000–1.030)
Total Protein, Urine: NEGATIVE
Urine Glucose: NEGATIVE
Urobilinogen, UA: 0.2 (ref 0.0–1.0)
pH: 6 (ref 5.0–8.0)

## 2020-04-17 NOTE — Telephone Encounter (Signed)
Even though it "does not show up in the urine" we need a urinalysis with micro reflex  and urine culture because Cipro is a medication that can cause serious side effects and I do not usually order it without a confirmed indication.  Please place an order at the low [facility] lab for urinalysis with reflex micro and urine culture to get this started.  Yes would like a virtual visit : Even though my schedule is full tomorrow we could give her a work in appointment at 915 or 945 after her urinalysis is completed.

## 2020-04-17 NOTE — Telephone Encounter (Signed)
Pt called and stated that she is on her way to the [place] lab to have the labs done.  Pt stated if you should need to speak with her she can be reached on her cell phone.

## 2020-04-18 ENCOUNTER — Encounter: Payer: Self-pay | Admitting: Internal Medicine

## 2020-04-18 ENCOUNTER — Telehealth (INDEPENDENT_AMBULATORY_CARE_PROVIDER_SITE_OTHER): Payer: Medicare Other | Admitting: Internal Medicine

## 2020-04-18 ENCOUNTER — Other Ambulatory Visit: Payer: Self-pay

## 2020-04-18 VITALS — BP 120/75 | Wt 215.0 lb

## 2020-04-18 DIAGNOSIS — R3915 Urgency of urination: Secondary | ICD-10-CM

## 2020-04-18 DIAGNOSIS — R350 Frequency of micturition: Secondary | ICD-10-CM

## 2020-04-18 DIAGNOSIS — R399 Unspecified symptoms and signs involving the genitourinary system: Secondary | ICD-10-CM

## 2020-04-18 DIAGNOSIS — Z79899 Other long term (current) drug therapy: Secondary | ICD-10-CM

## 2020-04-18 LAB — URINE CULTURE
MICRO NUMBER:: 11541857
SPECIMEN QUALITY:: ADEQUATE

## 2020-04-18 MED ORDER — SULFAMETHOXAZOLE-TRIMETHOPRIM 800-160 MG PO TABS: 1.0000 | ORAL_TABLET | Freq: Two times a day (BID) | ORAL | 0 refills | Status: DC

## 2020-04-18 NOTE — Progress Notes (Signed)
   Virtual Visit via Telephone Note  I connected with@ on 04/20/20 at  9:15 AM EST by telephone and verified that I am speaking with the correct person using two identifiers.   I discussed the limitations, risks, security and privacy concerns of performing an evaluation and management service by telephone and the limited availability of in person appointments. tThere may be a patient responsible charge related to this service. The patient expressed understanding and agreed to proceed.  Location patient: home Location provider: work or home office Participants present for the call: patient, provider Patient did not have a visit in the prior 7 days to address this/these issue(s).   History of Present Illness: Jade Cardenas presents with concerns about a possible UTI coming on.  She has a remote history of his severe UTI after knee surgery that began with similar symptoms.  But no infection recently. Has noted couple days of left sided back pain with increased urinary frequency and may be urgency without hematuria fever or chills.  She has had recent 2 iron infusions for her iron deficiency. She is on immunosuppressive agents. No major changes in meds.    Observations/Objective: Patient sounds personable and well on the phone. I do not appreciate any SOB. Speech and thought processing are grossly intact. Patient reported vitals: Lab Results  Component Value Date   WBC 6.0 02/02/2020   HGB 12.3 02/02/2020   HCT 35.4 02/02/2020   PLT 336 02/02/2020   GLUCOSE 148 (H) 02/02/2020   CHOL 184 11/10/2019   TRIG 152 (H) 11/10/2019   HDL 65 11/10/2019   LDLDIRECT 114.0 04/02/2010   LDLCALC 94 11/10/2019   ALT 24 02/02/2020   AST 24 02/02/2020   NA 141 02/02/2020   K 4.6 02/02/2020   CL 106 02/02/2020   CREATININE 0.81 02/02/2020   BUN 19 02/02/2020   CO2 27 02/02/2020   TSH 1.19 11/10/2019   INR 0.96 06/16/2017   HGBA1C 5.6 11/04/2018   UA obtained is clear culture is  pending. Assessment and Plan: Lower urinary tract symptoms (LUTS)  Urinary frequency  Urinary urgency  High risk medication use   Follow Up Instructions:    Because of the weekend coming out and she has been able to take Septra in the past send in 3 days of Septra 1 p.o. twice daily did not discuss potential risk with methotrexate because it is an only 3-day course benefit outweigh risk if she has a UTI. Suggest waiting for culture results come back but if things get worse take medicine.  [id number] 5-10 [id number] 11-20 [id number] 21-30 I did not refer this patient for an OV in the next 24 hours for this/these issue(s).  I discussed the assessment and treatment plan with the patient. The patient was provided an opportunity to ask questions and answered. The patient agreed with the plan and demonstrated an understanding of the instructions.   The patient was advised to call back or seek an in-person evaluation if the symptoms worsen or if the condition fails to improve as anticipated.  I provided  18 minutes of non-face-to-face time during this encounter. No follow-ups on file.  Shanon Ace, MD

## 2020-04-18 NOTE — Telephone Encounter (Signed)
Patient was checked in for the telephone visit today.

## 2020-04-20 NOTE — Progress Notes (Signed)
So urine does not show any predominant or significant count of germs that cause UTI.  Report is that any bacteria was felt to be external  superficial bacterial  that reside on skin in  genital area . So this doesn't fit with a UTI cause of your sx.  If needed we could do a better clean catch mid stram sample  for Urine culture  if symptoms are  on going severe or progressive   . Wonder if diet or med could cause the problem .  Can let us know next weeks if not getting better   forwarding info to your team

## 2020-05-07 ENCOUNTER — Ambulatory Visit (INDEPENDENT_AMBULATORY_CARE_PROVIDER_SITE_OTHER): Payer: Medicare Other | Admitting: Internal Medicine

## 2020-05-07 ENCOUNTER — Other Ambulatory Visit: Payer: Self-pay

## 2020-05-07 ENCOUNTER — Encounter: Payer: Self-pay | Admitting: Internal Medicine

## 2020-05-07 VITALS — BP 122/80 | HR 69 | Temp 97.5°F | Ht 64.0 in | Wt 214.0 lb

## 2020-05-07 DIAGNOSIS — R739 Hyperglycemia, unspecified: Secondary | ICD-10-CM

## 2020-05-07 DIAGNOSIS — E611 Iron deficiency: Secondary | ICD-10-CM | POA: Diagnosis not present

## 2020-05-07 DIAGNOSIS — R040 Epistaxis: Secondary | ICD-10-CM

## 2020-05-07 DIAGNOSIS — I10 Essential (primary) hypertension: Secondary | ICD-10-CM | POA: Diagnosis not present

## 2020-05-07 DIAGNOSIS — Z79899 Other long term (current) drug therapy: Secondary | ICD-10-CM | POA: Diagnosis not present

## 2020-05-07 LAB — CBC WITH DIFFERENTIAL/PLATELET
Basophils Absolute: 0.1 10*3/uL (ref 0.0–0.1)
Basophils Relative: 0.9 % (ref 0.0–3.0)
Eosinophils Absolute: 0.1 10*3/uL (ref 0.0–0.7)
Eosinophils Relative: 1.7 % (ref 0.0–5.0)
HCT: 39.2 % (ref 36.0–46.0)
Hemoglobin: 13.8 g/dL (ref 12.0–15.0)
Lymphocytes Relative: 29.7 % (ref 12.0–46.0)
Lymphs Abs: 1.9 10*3/uL (ref 0.7–4.0)
MCHC: 35.1 g/dL (ref 30.0–36.0)
MCV: 94.4 fl (ref 78.0–100.0)
Monocytes Absolute: 0.4 10*3/uL (ref 0.1–1.0)
Monocytes Relative: 5.5 % (ref 3.0–12.0)
Neutro Abs: 4 10*3/uL (ref 1.4–7.7)
Neutrophils Relative %: 62.2 % (ref 43.0–77.0)
Platelets: 313 10*3/uL (ref 150.0–400.0)
RBC: 4.16 Mil/uL (ref 3.87–5.11)
RDW: 16.3 % — ABNORMAL HIGH (ref 11.5–15.5)
WBC: 6.4 10*3/uL (ref 4.0–10.5)

## 2020-05-07 LAB — IBC + FERRITIN
Ferritin: 480.5 ng/mL — ABNORMAL HIGH (ref 10.0–291.0)
Iron: 96 ug/dL (ref 42–145)
Saturation Ratios: 29.9 % (ref 20.0–50.0)
Transferrin: 229 mg/dL (ref 212.0–360.0)

## 2020-05-07 LAB — HEMOGLOBIN A1C: Hgb A1c MFr Bld: 5.5 % (ref 4.6–6.5)

## 2020-05-07 LAB — COMPREHENSIVE METABOLIC PANEL
ALT: 21 U/L (ref 0–35)
AST: 18 U/L (ref 0–37)
Albumin: 4.5 g/dL (ref 3.5–5.2)
Alkaline Phosphatase: 97 U/L (ref 39–117)
BUN: 16 mg/dL (ref 6–23)
CO2: 31 mEq/L (ref 19–32)
Calcium: 10.1 mg/dL (ref 8.4–10.5)
Chloride: 102 mEq/L (ref 96–112)
Creatinine, Ser: 0.68 mg/dL (ref 0.40–1.20)
GFR: 89.79 mL/min (ref >60.00)
Glucose, Bld: 97 mg/dL (ref 70–99)
Potassium: 4.7 mEq/L (ref 3.5–5.1)
Sodium: 140 mEq/L (ref 135–145)
Total Bilirubin: 0.8 mg/dL (ref 0.2–1.2)
Total Protein: 7.5 g/dL (ref 6.0–8.3)

## 2020-05-07 NOTE — Progress Notes (Signed)
Blood sugar is in normal range  ferritin is elevated (right after the iron infusions) saturations better   routing information to Dr. Keturah Barre and hematology team

## 2020-05-07 NOTE — Patient Instructions (Signed)
Use saline  Drops spray and or gel   Esp at  Night to avoid dryness iun nose that can  Cause nose to bleed .  Let us know if  Assoc sx    persistent or progressive    Will notify you  of labs when available.

## 2020-05-07 NOTE — Progress Notes (Signed)
Chief Complaint  Patient presents with  . Follow-up    HPI: Jade Cardenas 70 y.o. come in for Chronic disease management and concerns.  LUTS    Filled rx but didn't take  . And subsided.   May have pulled back  .  Urinary symptoms are better BP: Okay  due for lab from Dr. Madelin Headings month. Minor nose bleeds right   Nostril winter time. brb blow nose    And cold compress.    Has  had   2 infusions of iron still has some tired But because of puppy waking up .  Her often. Neuropathy right foot in am.   Be evaluated soon by foot doctor.  Taking B complex vitamin. Blood pressure seems to be stable.   ROS: See pertinent positives and negatives per HPI.  Past Medical History:  Diagnosis Date  . Anemia    iron def  . Arthritis    rheumatoid, psoriatic, osteoarthritis   . Bone spur    RIGHT FOOT   . Broken toe   . Bruises easily   . Cellulitis   . Chronic back pain   . COLONIC POLYPS, HX OF 02/09/2007  . Cough, persistent 01/21/2012   poss from acei   . 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 Femur fracture, left ([place])   . Fibromyalgia    doesn't require meds  . GASTRIC ULCER, ACUTE, HEMORRHAGE, HX OF 02/09/2007  . GERD (gastroesophageal reflux disease)    takes Nexium daily  . Goiter   . H/O hiatal hernia   . Hx of seasonal allergies    takes Claritin daily  . HYPERLIPIDEMIA 12/29/2006   takes Lovastatin nightly  . HYPERTENSION 12/29/2006   takes Enalapril daily  . IBS 04/28/2007  . Insomnia    related to pain;takes Flexeril and Tylenol PM nightly  . Joint pain   . Joint swelling   . Osteopenia   . Palpitations 02/09/2007  . PONV (postoperative nausea and vomiting)    NAUSEA  . REDUCTION MAMMOPLASTY, HX OF 02/09/2007  . Right bundle branch block 02/09/2007  . S/P lumbar fusion 6 13 09/03/2011   L4 L5  posterior   . SYNCOPE 12/07/2007  . Tendonitis    RLE   . UNS ADVRS EFF OTH RX MEDICINAL\T\BIOLOGICAL SBSTNC 02/09/2007    Family History  Problem Relation Age of  Onset  . Diabetes Mother   . Colon cancer Mother        x2  . Arthritis Mother   . Hypertension Mother   . Endometrial cancer Mother   . Stroke Father   . Heart disease Father   . Colon cancer Father   . Arthritis Brother   . Hypertension Brother   . Hyperlipidemia Brother   . Infertility Brother   . Diabetes Brother   . Other Other        DISH brother  . Hypertension Maternal Grandmother   . Stroke Maternal Grandmother   . Anesthesia problems Neg Hx   . Hypotension Neg Hx   . Malignant hyperthermia Neg Hx   . Pseudochol deficiency Neg Hx     Social History   Socioeconomic History  . Marital status: Widowed    Spouse name: Not on file  . Number of children: Not on file  . Years of education: Not on file  . Highest education level: Not on file  Occupational History  . Not on file  Tobacco Use  . Smoking status: Never  Smoker  . Smokeless tobacco: Never Used  Vaping Use  . Vaping Use: Never used  Substance and Sexual Activity  . Alcohol use: No    Alcohol/week: 0.0 standard drinks  . Drug use: No  . Sexual activity: Not Currently    Partners: Male    Birth control/protection: Surgical    Comment: TAH/BSO  Other Topics Concern  . 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 acetaminophen (TYLENOL) 650 MG CR tablet Take 1,300 mg by mouth See admin instructions. Take 1300 mg in the morning and may take an additional 1300 mg dose as needed for pain    . amLODipine (NORVASC) 5 MG tablet TAKE 1 TABLET BY MOUTH EVERY DAY 90 tablet 0   . B Complex Vitamins (VITAMIN B COMPLEX PO) Take by mouth.    . B-D ALLERGY SYRINGE 1CC/28G 28G X 1/2" 1 ML MISC USE AS DIRECTED ONCE WEEKLY 10 each 4  . Calcium Carb-Cholecalciferol (CALCIUM+D3 PO) Take 1 tablet by mouth daily.    . diphenhydramine-acetaminophen (TYLENOL PM) 25-500 MG TABS tablet Take 2 tablets by mouth at bedtime.    . Golimumab (SIMPONI) 50 MG/0.5ML SOAJ INJECT 50MG  (1 PEN) UNDER THE SKIN ONCE MONTHLY 1.5 mL 0  . losartan (COZAAR) 100 MG tablet TAKE 1 TABLET BY MOUTH EVERY DAY 90 tablet 0  . lovastatin (MEVACOR) 20 MG tablet TAKE 1 TABLET BY MOUTH EVERY DAY IN THE EVENING 90 tablet 0  . Methotrexate Sodium (METHOTREXATE, PF,) 50 MG/2ML injection INJECT 0.3ML INTO THE SKIN ONCE WEEKLY 4 mL 0  . Multiple Vitamins-Minerals (MULTIVITAMIN ADULTS PO) Take 1 tablet by mouth daily.     Marland Kitchen omeprazole (PRILOSEC) 40 MG capsule TAKE 1 CAPSULE BY MOUTH EVERY DAY 90 capsule 0  . amoxicillin (AMOXIL) 500 MG capsule Take 2,000 mg by mouth See admin instructions. Take 2000 mg 1 hour prior to dental work (Patient not taking: Reported on 05/07/2020)    . sulfamethoxazole-trimethoprim (BACTRIM DS) 800-160 MG tablet Take 1 tablet by mouth 2 (two) times daily. For UTI 6 tablet 0   No facility-administered medications prior to visit.     EXAM:  BP 122/80 (BP Location: Left Arm, Patient Position: Sitting, Cuff Size: Large)   Pulse 69   Temp (!) 97.5 F (36.4 C) (Oral)   Ht 5\' 4"  (1.626 m)   Wt 214 lb (97.1 kg)   LMP 03/02/1984   SpO2 99%   BMI 36.73 kg/m   Body mass index is 36.73 kg/m.  GENERAL: vitals reviewed and listed above, alert, oriented, appears well hydrated and in no acute distress HEENT: atraumatic, conjunctiva  clear, no obvious abnormalities on inspection of external nose and ears anterior nodes no scab noted slight mucus and erythema no obvious obstruction face nontender OP : Masked NECK: no obvious masses on inspection palpation  LUNGS: clear to auscultation bilaterally,  no wheezes, rales or rhonchi, good air movement some kyphosis CV: HRRR, no clubbing cyanosis or  peripheral edema nl cap refill  MS:  moves all extremities without noticeable acute focal  abnormality PSYCH: pleasant and cooperative, no obvious depression or anxiety Lab Results  Component Value Date   WBC 6.4 05/07/2020   HGB 13.8 05/07/2020   HCT 39.2 05/07/2020   PLT 313.0 05/07/2020   GLUCOSE 97 05/07/2020   CHOL 184 11/10/2019   TRIG 152 (H) 11/10/2019   HDL 65 11/10/2019   LDLDIRECT 114.0 04/02/2010   LDLCALC 94 11/10/2019   ALT 21 05/07/2020   AST 18 05/07/2020   NA 140 05/07/2020   K 4.7 05/07/2020   CL 102 05/07/2020   CREATININE 0.68 05/07/2020   BUN 16 05/07/2020   CO2 31 05/07/2020   TSH 1.19 11/10/2019   INR 0.96 06/16/2017   HGBA1C 5.5 05/07/2020   BP Readings from Last 3 Encounters:  05/07/20 122/80  04/18/20 120/75  04/15/20 (!) 123/59   Lab Results  Component Value Date   VITAMINB12 521 11/10/2019    ASSESSMENT AND PLAN:  Discussed the following assessment and plan:  Essential hypertension - Plan: CBC with Differential/Platelet, Hemoglobin A1c, IBC + Ferritin, Comprehensive metabolic panel, Comprehensive metabolic panel, CBC with Differential/Platelet, Hemoglobin A1c, IBC + Ferritin  Medication management - Plan: CBC with Differential/Platelet, Hemoglobin A1c, IBC + Ferritin, Comprehensive metabolic panel, Comprehensive metabolic panel, CBC with Differential/Platelet, Hemoglobin A1c, IBC + Ferritin  High risk medication use - Plan: CBC with Differential/Platelet, Hemoglobin A1c, IBC + Ferritin, Comprehensive metabolic panel, Comprehensive metabolic panel, CBC with Differential/Platelet, Hemoglobin A1c, IBC + Ferritin  Hyperglycemia - Plan: CBC with Differential/Platelet, Hemoglobin A1c, IBC + Ferritin, Comprehensive metabolic panel, Comprehensive metabolic panel, CBC with Differential/Platelet, Hemoglobin A1c, IBC + Ferritin  Iron deficiency - Plan: CBC  with Differential/Platelet, Hemoglobin A1c, IBC + Ferritin, Comprehensive metabolic panel, Comprehensive metabolic panel, CBC with Differential/Platelet, Hemoglobin A1c, IBC + Ferritin  Bleeding from the nose - Plan: CBC with Differential/Platelet, Hemoglobin A1c, IBC + Ferritin, Comprehensive metabolic panel, Comprehensive metabolic panel, CBC with Differential/Platelet, Hemoglobin A1c, IBC + Ferritin Updated lab today will be due soon.  Include her iron studies even though she just had iron infusions. In regard to her intermittent small nosebleeds this is probably anterior irritation do not see an obvious mass and has no obstructive symptoms.  Discussed using saline moisturization sample given of Ayr gel for the nose and if persistent or progressive get back with [place]. Suspect some of her fatigue is from interrupted sleep from having a puppy.  Should improve. -Patient advised to return or notify health care team  if  new concerns arise.  Patient Instructions  Use saline  Drops spray and or gel   Esp at  Night to avoid dryness iun nose that can  Cause nose to bleed .  Let us know if  Assoc sx    persistent or progressive    Will notify you  of labs when available.    Standley Brooking. Mckaylah Bettendorf M.D.

## 2020-05-09 ENCOUNTER — Other Ambulatory Visit: Payer: Self-pay | Admitting: Rheumatology

## 2020-05-09 ENCOUNTER — Telehealth: Payer: Self-pay

## 2020-05-09 NOTE — Telephone Encounter (Signed)
Ok will delegate to the nursing team for now .

## 2020-05-09 NOTE — Telephone Encounter (Signed)
Note in the pts chart stated PCP sent info to [facility].  Nothing further needed.

## 2020-05-09 NOTE — Telephone Encounter (Signed)
Apolonio Schneiders, please assist with My Chart message from 05/09/2020.  Let me know if you have any questions

## 2020-05-10 NOTE — Telephone Encounter (Signed)
Next Visit: 622/2022  Last Visit: 03/20/2020  Last Fill: 01/31/2020  DX: Psoriatic arthritis  Current Dose per office note 03/20/2020, Simponi 50 mg every 28 days  Labs: 05/07/2020, RDW 16.3, Ferritin 480.5  TB Gold: 10/16/2019, negative  Okay to refill Simponi?

## 2020-05-13 ENCOUNTER — Telehealth: Payer: Self-pay | Admitting: Internal Medicine

## 2020-05-13 NOTE — Telephone Encounter (Signed)
Left message for patient to call back and schedule Medicare Annual Wellness Visit (AWV) either virtually or in office. No detailed message left   AWVI  please schedule at anytime with [facility] Nurse Health Advisor 1 or 2   This should be a 45 minute visit. 

## 2020-05-23 ENCOUNTER — Ambulatory Visit (INDEPENDENT_AMBULATORY_CARE_PROVIDER_SITE_OTHER): Payer: Medicare Other | Admitting: Podiatry

## 2020-05-23 ENCOUNTER — Other Ambulatory Visit: Payer: Self-pay

## 2020-05-23 DIAGNOSIS — G629 Polyneuropathy, unspecified: Secondary | ICD-10-CM

## 2020-05-23 MED ORDER — PREGABALIN 75 MG PO CAPS: 75.0000 mg | ORAL_CAPSULE | Freq: Two times a day (BID) | ORAL | 0 refills | Status: DC

## 2020-05-23 NOTE — Patient Instructions (Signed)
Pregabalin capsules What is this medicine? PREGABALIN (pre GAB a lin) is used to treat nerve pain from diabetes, shingles, spinal cord injury, and fibromyalgia. It is also used to control seizures in epilepsy. This medicine may be used for other purposes; ask your health care provider or pharmacist if you have questions. COMMON BRAND NAME(S): Lyrica What should I tell my health care provider before I take this medicine? They need to know if you have any of these conditions:  drug abuse or addiction  heart failure  kidney disease  lung disease  suicidal thoughts, plans or attempt  an unusual or allergic reaction to pregabalin, other medicines, foods, dyes, or preservatives  pregnant or trying to get pregnant  breast-feeding How should I use this medicine? Take this medicine by mouth with water. Take it as directed on the prescription label at the same time every day. You can take it with or without food. If it upsets your stomach, take it with food. Keep taking it unless your health care provider tells you to stop. A special MedGuide will be given to you by the pharmacist with each prescription and refill. Be sure to read this information carefully each time. Talk to your health care provider about the use of this medicine in children. While it may be prescribed for children as young as 1 month for selected conditions, precautions do apply. Overdosage: If you think you have taken too much of this medicine contact a poison control center or emergency room at once. NOTE: This medicine is only for you. Do not share this medicine with others. What if I miss a dose? If you miss a dose, take it as soon as you can. If it is almost time for your next dose, take only that dose. Do not take double or extra doses. What may interact with this medicine?  alcohol  antihistamines for allergy, cough, and cold  certain medicines for anxiety or sleep  certain medicines for blood pressure, heart  disease  certain medicines for depression like amitriptyline, fluoxetine, sertraline  certain medicines for diabetes, like pioglitazone, rosiglitazone  certain medicines for seizures like phenobarbital, primidone  general anesthetics like halothane, isoflurane, methoxyflurane, propofol  medicines that relax muscles for surgery  narcotic medicines for pain  phenothiazines like chlorpromazine, mesoridazine, prochlorperazine, thioridazine This list may not describe all possible interactions. Give your health care provider a list of all the medicines, herbs, non-prescription drugs, or dietary supplements you use. Also tell them if you smoke, drink alcohol, or use illegal drugs. Some items may interact with your medicine. What should I watch for while using this medicine? Visit your health care provider for regular checks on your progress. Tell your health care provider if your symptoms do not start to get better or if they get worse. Do not suddenly stop taking this medicine. You may develop a severe reaction. Your health care provider will tell you how much medicine to take. If your health care provider wants you to stop the medicine, the dose may be slowly lowered over time to avoid any side effects. You may get drowsy or dizzy. Do not drive, use machinery, or do anything that needs mental alertness until you know how this medicine affects you. Do not stand up or sit up quickly, especially if you are an older patient. This reduces the risk of dizzy or fainting spells. Alcohol may interfere with the effect of this medicine. Avoid alcoholic drinks. If you or your family notice any changes in your  behavior, such as new or worsening depression, thoughts of harming yourself, anxiety, other unusual or disturbing thoughts, or memory loss, call your health care provider right away. Wear a medical ID bracelet or chain if you are taking this medicine for seizures. Carry a card that describes your condition.  List the medicines and doses you take on the card. This medicine may make it more difficult to father a child. Talk to your health care provider if you are concerned about your fertility. What side effects may I notice from receiving this medicine? Side effects that you should report to your doctor or health care provider as soon as possible:  allergic reactions (skin rash, itching or hives; swelling of the face, lips, or tongue)  changes in vision  edema (sudden weight gain; swelling of the ankles, feet, hands or other unusual swelling; trouble breathing)  muscle injury (dark urine; trouble passing urine or change in the amount of urine; unusually weak or tired; muscle pain; back pain)  suicidal thoughts, mood changes  trouble breathing  unusual bruising or bleeding Side effects that usually do not require medical attention (report these to your doctor or health care provider if they continue or are bothersome):  dizziness  dry mouth  tiredness  weight gain This list may not describe all possible side effects. Call your doctor for medical advice about side effects. You may report side effects to FDA at 1-800-FDA-1088. Where should I keep my medicine? Keep out of the reach of children and pets. This medicine can be abused. Keep it in a safe place to protect it from theft. Do not share it with anyone. It is only for you. Selling or giving away this medicine is dangerous and against the law. Store at [employer] C (77 degrees F). Get rid of any unused medicine after the expiration date. This medicine may cause harm and death if it is taken by other adults, children, or pets. It is important to get rid of the medicine as soon as you no longer need it or it is expired. You can do this in two ways:  Take the medicine to a medicine take-back program. Check with your pharmacy or law enforcement to find a location.  If you cannot return the medicine, check the label or package insert to see  if the medicine should be thrown out in the garbage or flushed down the toilet. If you are not sure, ask your health care provider. If it is safe to put it in the trash, take the medicine out of the container. Mix the medicine with cat litter, dirt, coffee grounds, or other unwanted substance. Seal the mixture in a bag or container. Put it in the trash. NOTE: This sheet is a summary. It may not cover all possible information. If you have questions about this medicine, talk to your doctor, pharmacist, or health care provider.  2021 Elsevier/Gold Standard (2019-06-02 22:46:17)

## 2020-05-26 NOTE — Progress Notes (Signed)
Subjective: 70 year old female presents the office today for evaluation of numbness, neuropathy symptoms.  The injection was not very helpful.  She does describe the same symptoms on both feet.  Describes tingling, burning sensations to the feet.  No recent injury or falls or any trauma. Denies any systemic complaints such as fevers, chills, nausea, vomiting. No acute changes since last appointment, and no other complaints at this time.   Objective: AAO x3, NAD DP/PT pulses palpable bilaterally, CRT less than 3 seconds There is no area of pinpoint tenderness identified today.  Is no edema, erythema.  Flexor, extensor tendons appear to be intact.  Ankle, subtalar joint range of motion intact but any restrictions.  MMT 5/5.  No palpable neuroma today. No pain with calf compression, swelling, warmth, erythema  Assessment: Likely neuropathy  Plan: -All treatment options discussed with the patient including all alternatives, risks, complications.  -Regards to treatment options were discussed with an oral medication.  Restart Lyrica discussed side effects and success rates the medication.  If not covered we can try Cymbalta.  She previously was on gabapentin had a reaction to this but she does not remember what occurred. -Patient encouraged to call the office with any questions, concerns, change in symptoms.   Trula Slade DPM

## 2020-05-31 ENCOUNTER — Other Ambulatory Visit: Payer: Self-pay | Admitting: Internal Medicine

## 2020-06-27 ENCOUNTER — Other Ambulatory Visit: Payer: Self-pay | Admitting: Internal Medicine

## 2020-07-04 ENCOUNTER — Ambulatory Visit (INDEPENDENT_AMBULATORY_CARE_PROVIDER_SITE_OTHER): Payer: Medicare Other | Admitting: Podiatry

## 2020-07-04 ENCOUNTER — Other Ambulatory Visit: Payer: Self-pay

## 2020-07-04 DIAGNOSIS — M79671 Pain in right foot: Secondary | ICD-10-CM

## 2020-07-04 DIAGNOSIS — G629 Polyneuropathy, unspecified: Secondary | ICD-10-CM

## 2020-07-04 DIAGNOSIS — M778 Other enthesopathies, not elsewhere classified: Secondary | ICD-10-CM | POA: Diagnosis not present

## 2020-07-04 DIAGNOSIS — M7751 Other enthesopathy of right foot: Secondary | ICD-10-CM

## 2020-07-04 MED ORDER — DEXAMETHASONE SODIUM PHOSPHATE 120 MG/30ML IJ SOLN: 4.0000 mg | Freq: Once | INTRAMUSCULAR | Status: DC

## 2020-07-04 MED ORDER — DULOXETINE HCL 30 MG PO CPEP: 30.0000 mg | ORAL_CAPSULE | Freq: Every day | ORAL | 2 refills | Status: DC

## 2020-07-04 NOTE — Patient Instructions (Signed)
Duloxetine Delayed-Release Capsules What is this medicine? DULOXETINE (doo LOX e teen) is used to treat depression, anxiety, and different types of chronic pain. This medicine may be used for other purposes; ask your health care provider or pharmacist if you have questions. COMMON BRAND NAME(S): Cymbalta, Creig Hines, Irenka What should I tell my health care provider before I take this medicine? They need to know if you have any of these conditions:  bipolar disorder  glaucoma  high blood pressure  kidney disease  liver disease  seizures  suicidal thoughts, plans or attempt; a previous suicide attempt by you or a family member  take medicines that treat or prevent blood clots  taken medicines called MAOIs like Carbex, Eldepryl, Marplan, Nardil, and Parnate within 14 days  trouble passing urine  an unusual reaction to duloxetine, other medicines, foods, dyes, or preservatives  pregnant or trying to get pregnant  breast-feeding How should I use this medicine? Take this medicine by mouth with a glass of water. Follow the directions on the prescription label. Do not crush, cut or chew some capsules of this medicine. Some capsules may be opened and sprinkled on applesauce. Check with your doctor or pharmacist if you are not sure. You can take this medicine with or without food. Take your medicine at regular intervals. Do not take your medicine more often than directed. Do not stop taking this medicine suddenly except upon the advice of your doctor. Stopping this medicine too quickly may cause serious side effects or your condition may worsen. A special MedGuide will be given to you by the pharmacist with each prescription and refill. Be sure to read this information carefully each time. Talk to your pediatrician regarding the use of this medicine in children. While this drug may be prescribed for children as young as 55 years of age for selected conditions, precautions do  apply. Overdosage: If you think you have taken too much of this medicine contact a poison control center or emergency room at once. NOTE: This medicine is only for you. Do not share this medicine with others. What if I miss a dose? If you miss a dose, take it as soon as you can. If it is almost time for your next dose, take only that dose. Do not take double or extra doses. What may interact with this medicine? Do not take this medicine with any of the following medications:  desvenlafaxine  levomilnacipran  linezolid  MAOIs like Carbex, Eldepryl, Emsam, Marplan, Nardil, and Parnate  methylene blue (injected into a vein)  milnacipran  safinamide  thioridazine  venlafaxine  viloxazine This medicine may also interact with the following medications:  alcohol  amphetamines  aspirin and aspirin-like medicines  certain antibiotics like ciprofloxacin and enoxacin  certain medicines for blood pressure, heart disease, irregular heart beat  certain medicines for depression, anxiety, or psychotic disturbances  certain medicines for migraine headache like almotriptan, eletriptan, frovatriptan, naratriptan, rizatriptan, sumatriptan, zolmitriptan  certain medicines that treat or prevent blood clots like warfarin, enoxaparin, and dalteparin  cimetidine  fentanyl  lithium  NSAIDS, medicines for pain and inflammation, like ibuprofen or naproxen  phentermine  procarbazine  rasagiline  sibutramine  St. John's wort  theophylline  tramadol  tryptophan This list may not describe all possible interactions. Give your health care provider a list of all the medicines, herbs, non-prescription drugs, or dietary supplements you use. Also tell them if you smoke, drink alcohol, or use illegal drugs. Some items may interact with your medicine. What  should I watch for while using this medicine? Tell your doctor if your symptoms do not get better or if they get worse. Visit your  doctor or healthcare provider for regular checks on your progress. Because it may take several weeks to see the full effects of this medicine, it is important to continue your treatment as prescribed by your doctor. This medicine may cause serious skin reactions. They can happen weeks to months after starting the medicine. Contact your healthcare provider right away if you notice fevers or flu-like symptoms with a rash. The rash may be red or purple and then turn into blisters or peeling of the skin. Or, you might notice a red rash with swelling of the face, lips, or lymph nodes in your neck or under your arms. Patients and their families should watch out for new or worsening thoughts of suicide or depression. Also watch out for sudden changes in feelings such as feeling anxious, agitated, panicky, irritable, hostile, aggressive, impulsive, severely restless, overly excited and hyperactive, or not being able to sleep. If this happens, especially at the beginning of treatment or after a change in dose, call your healthcare provider. You may get drowsy or dizzy. Do not drive, use machinery, or do anything that needs mental alertness until you know how this medicine affects you. Do not stand or sit up quickly, especially if you are an older patient. This reduces the risk of dizzy or fainting spells. Alcohol may interfere with the effect of this medicine. Avoid alcoholic drinks. This medicine can cause an increase in blood pressure. This medicine can also cause a sudden drop in your blood pressure, which may make you feel faint and increase the chance of a fall. These effects are most common when you first start the medicine or when the dose is increased, or during use of other medicines that can cause a sudden drop in blood pressure. Check with your doctor for instructions on monitoring your blood pressure while taking this medicine. Your mouth may get dry. Chewing sugarless gum or sucking hard candy, and drinking  plenty of water, may help. Contact your doctor if the problem does not go away or is severe. What side effects may I notice from receiving this medicine? Side effects that you should report to your doctor or health care professional as soon as possible:  allergic reactions like skin rash, itching or hives, swelling of the face, lips, or tongue  anxious  breathing problems  confusion  changes in vision  chest pain  confusion  elevated mood, decreased need for sleep, racing thoughts, impulsive behavior  eye pain  fast, irregular heartbeat  feeling faint or lightheaded, falls  feeling agitated, angry, or irritable  hallucination, loss of contact with reality  high blood pressure  loss of balance or coordination  palpitations  redness, blistering, peeling or loosening of the skin, including inside the mouth  restlessness, pacing, inability to keep still  seizures  stiff muscles  suicidal thoughts or other mood changes  trouble passing urine or change in the amount of urine  trouble sleeping  unusual bleeding or bruising  unusually weak or tired  vomiting  yellowing of the eyes or skin Side effects that usually do not require medical attention (report to your doctor or health care professional if they continue or are bothersome):  change in sex drive or performance  change in appetite or weight  constipation  dizziness  dry mouth  headache  increased sweating  nausea  tired  This list may not describe all possible side effects. Call your doctor for medical advice about side effects. You may report side effects to FDA at 1-800-FDA-1088. Where should I keep my medicine? Keep out of the reach of children and pets. Store at room temperature between 15 and 30 degrees C (59 to 86 degrees F). Get rid of any unused medicine after the expiration date. To get rid of medicines that are no longer needed or have expired:  Take the medicine to a medicine  take-back program. Check with your pharmacy or law enforcement to find a location.  If you cannot return the medicine, check the label or package insert to see if the medicine should be thrown out in the garbage or flushed down the toilet. If you are not sure, ask your health care provider. If it is safe to put it in the trash, take the medicine out of the container. Mix the medicine with cat litter, dirt, coffee grounds, or other unwanted substance. Seal the mixture in a bag or container. Put it in the trash. NOTE: This sheet is a summary. It may not cover all possible information. If you have questions about this medicine, talk to your doctor, pharmacist, or health care provider.  2021 Elsevier/Gold Standard (2020-01-04 16:06:16)

## 2020-07-09 NOTE — Progress Notes (Signed)
Subjective: 70 year old female presents the office today for follow-up evaluation of neuropathy, right foot pain.  She gets pain on the first MPJ that she is pointing to currently but denies any recent injury or trauma.  She states the Lyrica has been helping however is making her dizzy and causing a headache. Denies any systemic complaints such as fevers, chills, nausea, vomiting. No acute changes since last appointment, and no other complaints at this time.   Objective: AAO x3, NAD DP/PT pulses palpable bilaterally, CRT less than 3 seconds Today there is tenderness palpation on the right first MPJ with no crepitation with decreased range of motion.  The majority discomfort on the dorsal as well as the medial aspect the first MPJ.  No edema, erythema.  Flexor, extensor considered to be intact bilaterally.  No other area discomfort.  No pain with calf compression, swelling, warmth, erythema  Assessment: Right foot capsulitis, neuropathy  Plan: -All treatment options discussed with the patient including all alternatives, risks, complications.  -Steroid injection performed at the first MPJ.  Skin was prepped with alcohol and mixture of 0.5 cc of Marcaine plain, 0.5 cc of dexamethasone phosphate was infiltrated into the area of tenderness in the first MPJ without complications.  Postinjection care discussed.  Offloading.  Graphite insert. -Thurmond Butts is stopped the Lyrica due to the side effects and start Cymbalta for neuropathy. -Patient encouraged to call the office with any questions, concerns, change in symptoms.   Trula Slade DPM

## 2020-07-23 ENCOUNTER — Other Ambulatory Visit: Payer: Self-pay

## 2020-07-23 ENCOUNTER — Ambulatory Visit (INDEPENDENT_AMBULATORY_CARE_PROVIDER_SITE_OTHER): Payer: Medicare Other | Admitting: Obstetrics & Gynecology

## 2020-07-23 ENCOUNTER — Other Ambulatory Visit (HOSPITAL_COMMUNITY)
Admission: RE | Admit: 2020-07-23 | Discharge: 2020-07-23 | Disposition: A | Discharge status: Home / Self Care | Payer: Medicare Other | Source: Ambulatory Visit | Attending: Obstetrics & Gynecology | Admitting: Obstetrics & Gynecology

## 2020-07-23 ENCOUNTER — Encounter (HOSPITAL_BASED_OUTPATIENT_CLINIC_OR_DEPARTMENT_OTHER): Payer: Self-pay | Admitting: Obstetrics & Gynecology

## 2020-07-23 VITALS — BP 123/76 | HR 79 | Ht 62.75 in | Wt 211.0 lb

## 2020-07-23 DIAGNOSIS — Z90710 Acquired absence of both cervix and uterus: Secondary | ICD-10-CM | POA: Diagnosis not present

## 2020-07-23 DIAGNOSIS — Z862 Personal history of diseases of the blood and blood-forming organs and certain disorders involving the immune mechanism: Secondary | ICD-10-CM

## 2020-07-23 DIAGNOSIS — L4050 Arthropathic psoriasis, unspecified: Secondary | ICD-10-CM | POA: Diagnosis not present

## 2020-07-23 DIAGNOSIS — Z9189 Other specified personal risk factors, not elsewhere classified: Secondary | ICD-10-CM | POA: Insufficient documentation

## 2020-07-23 DIAGNOSIS — M8588 Other specified disorders of bone density and structure, other site: Secondary | ICD-10-CM | POA: Diagnosis not present

## 2020-07-23 DIAGNOSIS — Z800 Family history of malignant neoplasm of digestive organs: Secondary | ICD-10-CM | POA: Diagnosis not present

## 2020-07-23 DIAGNOSIS — Z1231 Encounter for screening mammogram for malignant neoplasm of breast: Secondary | ICD-10-CM

## 2020-07-23 DIAGNOSIS — Z01419 Encounter for gynecological examination (general) (routine) without abnormal findings: Secondary | ICD-10-CM | POA: Diagnosis not present

## 2020-07-23 NOTE — Addendum Note (Signed)
Addended by: Megan Salon on: 07/23/2020 10:38 AM   Modules accepted: Orders

## 2020-07-23 NOTE — Progress Notes (Signed)
70 y.o. G0P0000 Widowed White or Caucasian female here for breast and pelvic exam.  H/o in utero DES exposure.  Denies vaginal bleeding.  Did have iron infusion x 2 earlier this year.  Follow up lab work was good.  Has follow up with Dr. Irene Limbo.    Patient's last menstrual period was 03/02/1984.          Sexually active: No.  H/O STD:  no  Health Maintenance: PCP:  Dr. Regis Bill.  Last wellness appt was 04/2020.  Did blood work at that appt:  Done 11/2019 Vaccines are up to date:  yes Colonoscopy:  06/2015, follow up 5 years.  DR. [name].  Pt states she will call [facility]:  11/2019 BMD:  Osteopenia with BMD 08/2017, -1.8 Last pap smear:  2021.   H/o abnormal pap smear:  no   reports that she has never smoked. She has never used smokeless tobacco. She reports that she does not drink alcohol and does not use drugs.  Past Medical History:  Diagnosis Date  . Anemia    iron def  . Arthritis    rheumatoid, psoriatic, osteoarthritis   . Bone spur    RIGHT FOOT   . Broken toe   . Bruises easily   . Cellulitis   . Chronic back pain   . COLONIC POLYPS, HX OF 02/09/2007  . Cough, persistent 01/21/2012   poss from acei   . 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 Femur fracture, left ([place])   . Fibromyalgia    doesn't require meds  . GASTRIC ULCER, ACUTE, HEMORRHAGE, HX OF 02/09/2007  . GERD (gastroesophageal reflux disease)    takes Nexium daily  . Goiter   . H/O hiatal hernia   . Hx of seasonal allergies    takes Claritin daily  . HYPERLIPIDEMIA 12/29/2006   takes Lovastatin nightly  . HYPERTENSION 12/29/2006   takes Enalapril daily  . IBS 04/28/2007  . Insomnia    related to pain;takes Flexeril and Tylenol PM nightly  . Joint pain   . Joint swelling   . Osteopenia   . Palpitations 02/09/2007  . PONV (postoperative nausea and vomiting)    NAUSEA  . REDUCTION MAMMOPLASTY, HX OF 02/09/2007  . Right bundle branch block 02/09/2007  . S/P lumbar fusion 6 13 09/03/2011   L4 L5  posterior   .  SYNCOPE 12/07/2007  . Tendonitis    RLE   . UNS ADVRS EFF OTH RX MEDICINAL\T\BIOLOGICAL SBSTNC 02/09/2007    Past Surgical History:  Procedure Laterality Date  . BACK SURGERY  12+yrs ago   Synovial Cyst removal  . BACK SURGERY  6/13   lumbar L4-L5 fusion, lamenectomy  . bone spur  6-44yrs ago   right ankle  . BUNIONECTOMY  10/03/14   right foot  . CERVICAL FUSION     2 12   . CHOLECYSTECTOMY  1996  . HARDWARE REMOVAL Left 08/01/2018   Procedure: Left knee hardware removal;  Surgeon: Gaynelle Arabian, MD;  Location: [facility];  Service: Orthopedics;  Laterality: Left;  23min  . JOINT REPLACEMENT    . KNEE ARTHROPLASTY    . POSTERIOR CERVICAL LAMINECTOMY Left 07/30/2015   Procedure: Laminectomy and Foraminotomy - left - Cervical two -Cervical three;  Surgeon: Earnie Larsson, MD;  Location: [facility];  Service: Neurosurgery;  Laterality: Left;  . REDUCTION MAMMAPLASTY Bilateral 1989  . TONSILLECTOMY     as a child  . TOTAL ABDOMINAL HYSTERECTOMY W/ BILATERAL SALPINGOOPHORECTOMY  [place] N/A 06/21/2017   Procedure: LEFT TOTAL KNEE ARTHROPLASTY AND RIGHT KNEE CORTISONE INJECTION;  Surgeon: Gaynelle Arabian, MD;  Location: [facility];  Service: Orthopedics;  Laterality: N/A;    Current Outpatient Medications  Medication Sig Dispense Refill  . acetaminophen (TYLENOL) 650 MG CR tablet Take 1,300 mg by mouth See admin instructions. Take 1300 mg in the morning and may take an additional 1300 mg dose as needed for pain    . amLODipine (NORVASC) 5 MG tablet TAKE 1 TABLET BY MOUTH EVERY DAY 90 tablet 0  . B Complex Vitamins (VITAMIN B COMPLEX PO) Take by mouth.    . B-D ALLERGY SYRINGE 1CC/28G 28G X 1/2" 1 ML MISC USE AS DIRECTED ONCE WEEKLY 10 each 4  . Calcium Carb-Cholecalciferol (CALCIUM+D3 PO) Take 1 tablet by mouth daily.    . diphenhydramine-acetaminophen (TYLENOL PM) 25-500 MG TABS tablet Take 2 tablets by mouth at bedtime.    . DULoxetine (CYMBALTA) 30 MG capsule Take 1  capsule (30 mg total) by mouth daily. 30 capsule 2  . losartan (COZAAR) 100 MG tablet TAKE 1 TABLET BY MOUTH EVERY DAY 90 tablet 0  . lovastatin (MEVACOR) 20 MG tablet TAKE 1 TABLET BY MOUTH EVERY DAY IN THE EVENING 90 tablet 0  . Methotrexate Sodium (METHOTREXATE, PF,) 50 MG/2ML injection INJECT 0.3ML INTO THE SKIN ONCE WEEKLY 4 mL 0  . Multiple Vitamins-Minerals (MULTIVITAMIN ADULTS PO) Take 1 tablet by mouth daily.     Marland Kitchen omeprazole (PRILOSEC) 40 MG capsule TAKE 1 CAPSULE BY MOUTH EVERY DAY 90 capsule 0  . SIMPONI 50 MG/0.5ML SOAJ INJECT 50MG  (1 PEN) UNDER THE SKIN ONCE MONTHLY 1.5 mL 0  . pregabalin (LYRICA) 75 MG capsule Take 1 capsule (75 mg total) by mouth 2 (two) times daily. 120 capsule 0   Current Facility-Administered Medications  Medication Dose Route Frequency Provider Last Rate Last Admin  . dexamethasone (DECADRON) injection 4 mg  4 mg Intra-articular Once Trula Slade, DPM        Family History  Problem Relation Age of Onset  . Diabetes Mother   . Colon cancer Mother        x2  . Arthritis Mother   . Hypertension Mother   . Endometrial cancer Mother   . Stroke Father   . Heart disease Father   . Colon cancer Father   . Arthritis Brother   . Hypertension Brother   . Hyperlipidemia Brother   . Infertility Brother   . Diabetes Brother   . Other Other        DISH brother  . Hypertension Maternal Grandmother   . Stroke Maternal Grandmother   . Anesthesia problems Neg Hx   . Hypotension Neg Hx   . Malignant hyperthermia Neg Hx   . Pseudochol deficiency Neg Hx     Review of Systems  Constitutional: Negative.   Gastrointestinal: Negative.   Genitourinary: Negative.   Neurological: Negative for speech difficulty.  Psychiatric/Behavioral: Negative.     Exam:   BP 123/76   Pulse 79   Ht 5' 2.75" (1.594 m)   Wt 211 lb (95.7 kg)   LMP 03/02/1984   BMI 37.68 kg/m   Height: 5' 2.75" (159.4 cm)  General appearance: alert, cooperative and appears stated  age Breasts: normal appearance, no masses or tenderness Abdomen: soft, non-tender; bowel sounds normal; no masses,  no organomegaly Lymph nodes: Cervical, supraclavicular, and axillary nodes normal.  No abnormal inguinal nodes palpated  Neurologic: Grossly normal  Pelvic: External genitalia:  no lesions              Urethra:  normal appearing urethra with no masses, tenderness or lesions              Bartholins and Skenes: normal                 Vagina: normal appearing vagina with atrophic changes and no discharge, no lesions              Cervix: absent              Pap taken: No.  Pap of all 4 vaginal quadrants obtained as well as at vaginal cuff Bimanual Exam:  Uterus:  uterus absent              Adnexa: normal masses               Rectovaginal: Confirms               Anus:  normal sphincter tone, no lesions  Chaperone, Octaviano Batty, CMA, was present for exam.  Assessment/Plan: 1. GYN exam for high-risk Medicare patient - Pap smear obtained today - [facility] order placed.  Currently up to date. - BMD order placed - colonoscopy is due.  She is going to call Dr. [name]. - vaccines and medication up to date  2. DES exposure in utero - Cytology - PAP( [place])  3. Family history of malignant neoplasm of digestive organ  4. H/O: hysterectomy  5. History of iron deficiency anemia - has appt by Dr. Irene Limbo  6.  Osteopenia of lumbar spine - DG BONE DENSITY (DXA); Future

## 2020-07-24 LAB — CYTOLOGY - PAP: Diagnosis: NEGATIVE

## 2020-07-24 NOTE — Progress Notes (Signed)
Marland Kitchen    HEMATOLOGY/ONCOLOGY CLINIC NOTE  Date of Service:  07/25/2020  Patient Care Team: [name], MD as PCP - General Richmond Campbell, MD (Gastroenterology) Megan Salon, MD (Gynecology) Bo Merino, MD as Consulting Physician (Rheumatology) Brunetta Genera, MD as Consulting Physician (Hematology) Gaynelle Arabian, MD as Consulting Physician (Orthopedic Surgery)  CHIEF COMPLAINTS/PURPOSE OF CONSULTATION: f/u for Iron deficiency anemia  Diagnosis severe iron deficiency anemia + ACD (due to psoariasis) Oral iron intolerance  Treatment IV Iron as needed (received IV feraheme 08/29/15, 08/22/2015, 06/04/2015, 05/28/2015)   INTERVAL HISTORY  Jade Cardenas is here for her scheduled follow-up for iron deficiency anemia. The patient's last visit with [place] was on 05/29/2019. The pt reports that she is doing well overall.  The pt reports that she has recently gotten a new puppy. 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 She has been staying busy and active. She still has some difficulty sleeping and some fatigue. The pt notes some neuropathy developed in her hands and feet. She is on Cymbalta currently. Her arthritis and knees are still a bother to her. She received two iron infusions in February that really helped with her energy levels. She has started to get more fatigue again. She has her repeat colonoscopy in September.  Lab results today 07/25/2020 of CBC w/diff and CMP is as follows: all values are WNL except for MCH of 34.1, MCHC of 36.5, Glucose of 115. 07/25/2020 Iron sat 23%. 07/25/2020 Ferritin 248  On review of systems, pt reports fatigue and denies bleeding issues, black/bloody stools, abdominal pain, changes in bowel habits, leg swelling, and any other symptoms.  MEDICAL HISTORY:  Past Medical History:  Diagnosis Date  . Anemia    iron def  . Arthritis    rheumatoid, psoriatic, osteoarthritis   . Bone spur    RIGHT FOOT   . Broken toe   .  Bruises easily   . Cellulitis   . Chronic back pain   . COLONIC POLYPS, HX OF 02/09/2007  . Cough, persistent 01/21/2012   poss from acei   . 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 Femur fracture, left ([place])   . Fibromyalgia    doesn't require meds  . GASTRIC ULCER, ACUTE, HEMORRHAGE, HX OF 02/09/2007  . GERD (gastroesophageal reflux disease)    takes Nexium daily  . Goiter   . H/O hiatal hernia   . Hx of seasonal allergies    takes Claritin daily  . HYPERLIPIDEMIA 12/29/2006   takes Lovastatin nightly  . HYPERTENSION 12/29/2006   takes Enalapril daily  . IBS 04/28/2007  . Insomnia    related to pain;takes Flexeril and Tylenol PM nightly  . Joint pain   . Joint swelling   . Osteopenia   . Palpitations 02/09/2007  . PONV (postoperative nausea and vomiting)    NAUSEA  . REDUCTION MAMMOPLASTY, HX OF 02/09/2007  . Right bundle branch block 02/09/2007  . S/P lumbar fusion 6 13 09/03/2011   L4 L5  posterior   . SYNCOPE 12/07/2007  . Tendonitis    RLE   . UNS ADVRS EFF OTH RX MEDICINAL\T\BIOLOGICAL SBSTNC 02/09/2007    SURGICAL HISTORY: Past Surgical History:  Procedure Laterality Date  . BACK SURGERY  12+yrs ago   Synovial Cyst removal  . BACK SURGERY  6/13   lumbar L4-L5 fusion, lamenectomy  . bone spur  6-78yrs ago   right ankle  . BUNIONECTOMY  10/03/14   right foot  . CERVICAL  FUSION     2 12   . CHOLECYSTECTOMY  1996  . HARDWARE REMOVAL Left 08/01/2018   Procedure: Left knee hardware removal;  Surgeon: Gaynelle Arabian, MD;  Location: [facility];  Service: Orthopedics;  Laterality: Left;  18min  . JOINT REPLACEMENT    . KNEE ARTHROPLASTY    . POSTERIOR CERVICAL LAMINECTOMY Left 07/30/2015   Procedure: Laminectomy and Foraminotomy - left - Cervical two -Cervical three;  Surgeon: Earnie Larsson, MD;  Location: [facility];  Service: Neurosurgery;  Laterality: Left;  . REDUCTION MAMMAPLASTY Bilateral 1989  . TONSILLECTOMY     as a child  . TOTAL ABDOMINAL HYSTERECTOMY W/ BILATERAL  SALPINGOOPHORECTOMY  1986  . TOTAL KNEE ARTHROPLASTY N/A 06/21/2017   Procedure: LEFT TOTAL KNEE ARTHROPLASTY AND RIGHT KNEE CORTISONE INJECTION;  Surgeon: Gaynelle Arabian, MD;  Location: [facility];  Service: Orthopedics;  Laterality: N/A;    SOCIAL HISTORY: Social History   Socioeconomic History  . Marital status: Widowed    Spouse name: Not on file  . Number of children: Not on file  . Years of education: Not on file  . Highest education level: Not on file  Occupational History  . Not on file  Tobacco Use  . Smoking status: Never Smoker  . Smokeless tobacco: Never Used  Vaping Use  . Vaping Use: Never used  Substance and Sexual Activity  . Alcohol use: No    Alcohol/week: 0.0 standard drinks  . Drug use: No  . Sexual activity: Not Currently    Partners: Male    Birth control/protection: Surgical    Comment: TAH/BSO  Other Topics Concern  . 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 Diabetes Mother   . Colon cancer Mother        x2  . Arthritis Mother   . Hypertension Mother   . Endometrial cancer Mother   . Stroke Father   . Heart disease Father   . Colon cancer Father   . Arthritis Brother   . Hypertension Brother   . Hyperlipidemia Brother   . Infertility Brother   . Diabetes Brother   . Other Other        DISH brother  . Hypertension Maternal Grandmother   . Stroke Maternal Grandmother   . Anesthesia problems Neg Hx   . Hypotension Neg Hx   . Malignant hyperthermia Neg Hx   . Pseudochol  deficiency Neg Hx     ALLERGIES:  is allergic to erythromycin, iodinated diagnostic agents, ace inhibitors, keflex [cephalexin], and macrobid [nitrofurantoin].  MEDICATIONS:  Current Outpatient Medications  Medication Sig Dispense Refill  . acetaminophen (TYLENOL) 650 MG CR tablet Take 1,300 mg by mouth See admin instructions. Take 1300 mg in the morning and may take an additional 1300 mg dose as needed for pain    . amLODipine (NORVASC) 5 MG tablet TAKE 1 TABLET BY MOUTH EVERY DAY 90 tablet 0  . B Complex Vitamins (VITAMIN B COMPLEX  PO) Take by mouth.    . B-D ALLERGY SYRINGE 1CC/28G 28G X 1/2" 1 ML MISC USE AS DIRECTED ONCE WEEKLY 10 each 4  . Calcium Carb-Cholecalciferol (CALCIUM+D3 PO) Take 1 tablet by mouth daily.    . diphenhydramine-acetaminophen (TYLENOL PM) 25-500 MG TABS tablet Take 2 tablets by mouth at bedtime.    . DULoxetine (CYMBALTA) 30 MG capsule Take 1 capsule (30 mg total) by mouth daily. 30 capsule 2  . losartan (COZAAR) 100 MG tablet TAKE 1 TABLET BY MOUTH EVERY DAY 90 tablet 0  . lovastatin (MEVACOR) 20 MG tablet TAKE 1 TABLET BY MOUTH EVERY DAY IN THE EVENING 90 tablet 0  . Methotrexate Sodium (METHOTREXATE, PF,) 50 MG/2ML injection INJECT 0.3ML INTO THE SKIN ONCE WEEKLY 4 mL 0  . Multiple Vitamins-Minerals (MULTIVITAMIN ADULTS PO) Take 1 tablet by mouth daily.     Marland Kitchen omeprazole (PRILOSEC) 40 MG capsule TAKE 1 CAPSULE BY MOUTH EVERY DAY 90 capsule 0  . SIMPONI 50 MG/0.5ML SOAJ INJECT 50MG  (1 PEN) UNDER THE SKIN ONCE MONTHLY 1.5 mL 0   Current Facility-Administered Medications  Medication Dose Route Frequency Provider Last Rate Last Admin  . dexamethasone (DECADRON) injection 4 mg  4 mg Intra-articular Once Trula Slade, DPM        REVIEW OF SYSTEMS:   10 Point review of Systems was done is negative except as noted above.  PHYSICAL EXAMINATION: ECOG PERFORMANCE STATUS: 2 - Symptomatic, <50% confined to bed  . Vitals:   07/25/20 1426  BP: (!) 152/52   Pulse: 75  Resp: 18  Temp: 97.6 F (36.4 C)  SpO2: 100%   Filed Weights   07/25/20 1426  Weight: 211 lb 14.4 oz (96.1 kg)   .Body mass index is 37.84 kg/m.  Exam was given in a chair.  GENERAL:alert, in no acute distress and comfortable SKIN: no acute rashes, no significant lesions EYES: conjunctiva are pink and non-injected, sclera anicteric OROPHARYNX: MMM, no exudates, no oropharyngeal erythema or ulceration NECK: supple, no JVD LYMPH:  no palpable lymphadenopathy in the cervical, axillary or inguinal regions LUNGS: clear to auscultation b/l with normal respiratory effort HEART: regular rate & rhythm ABDOMEN:  normoactive bowel sounds , non tender, not distended. Extremity: no pedal edema PSYCH: alert & oriented x 3 with fluent speech NEURO: no focal motor/sensory deficits   LABORATORY DATA:  I have reviewed the data as listed  . CBC Latest Ref Rng & Units 07/25/2020 05/07/2020 02/02/2020  WBC 4.0 - 10.5 K/uL 6.1 6.4 6.0  Hemoglobin 12.0 - 15.0 g/dL 13.8 13.8 12.3  Hematocrit 36.0 - 46.0 % 37.8 39.2 35.4  Platelets 150 - 400 K/uL 291 313.0 336   . CBC    Component Value Date/Time   WBC 6.1 07/25/2020 1411   WBC 6.4 05/07/2020 1018   RBC 4.05 07/25/2020 1411   HGB 13.8 07/25/2020 1411   HGB 14.0 07/24/2019 1425   HGB 13.2 10/28/2016 1029   HCT 37.8 07/25/2020 1411   HCT 40.4 07/24/2019 1425   HCT 37.5 10/28/2016 1029   PLT 291 07/25/2020 1411   PLT 367 07/24/2019 1425   MCV 93.3 07/25/2020 1411   MCV 95 07/24/2019 1425   MCV 99.5 10/28/2016 1029   MCH 34.1 (H) 07/25/2020 1411   MCHC 36.5 (H) 07/25/2020 1411   RDW 12.5 07/25/2020 1411   RDW 14.3 07/24/2019 1425   RDW 12.7 10/28/2016 1029   LYMPHSABS 1.7 07/25/2020 1411   LYMPHSABS 2.3 07/24/2019 1425  LYMPHSABS 1.9 10/28/2016 1029   MONOABS 0.4 07/25/2020 1411   MONOABS 0.3 10/28/2016 1029   EOSABS 0.0 07/25/2020 1411   EOSABS 0.1 07/24/2019 1425   BASOSABS 0.0 07/25/2020 1411   BASOSABS 0.1  07/24/2019 1425   BASOSABS 0.0 10/28/2016 1029    . Lab Results  Component Value Date   RETICCTPCT 2.27 (H) 10/28/2016   RBC 4.05 07/25/2020   RETICCTABS 85.58 10/28/2016    . CMP Latest Ref Rng & Units 07/25/2020 05/07/2020 02/02/2020  Glucose 70 - 99 mg/dL 115(H) 97 148(H)  BUN 8 - 23 mg/dL 18 16 19   Creatinine 0.44 - 1.00 mg/dL 0.80 0.68 0.81  Sodium 135 - 145 mmol/L 140 140 141  Potassium 3.5 - 5.1 mmol/L 4.0 4.7 4.6  Chloride 98 - 111 mmol/L 104 102 106  CO2 22 - 32 mmol/L 28 31 27   Calcium 8.9 - 10.3 mg/dL 9.7 10.1 9.7  Total Protein 6.5 - 8.1 g/dL 8.0 7.5 7.0  Total Bilirubin 0.3 - 1.2 mg/dL 0.7 0.8 0.5  Alkaline Phos 38 - 126 U/L 102 97 -  AST 15 - 41 U/L 15 18 24   ALT 0 - 44 U/L 11 21 24     Lab Results  Component Value Date   FERRITIN 480.5 (H) 05/07/2020    RADIOGRAPHIC STUDIES: I have personally reviewed the radiological images as listed and agreed with the findings in the report. No results found.  ASSESSMENT & PLAN:   Ms. Slabach is a pleasant 70 year old female with  #1 Severe microcytic hypochromic anemia  Due to iron deficiency. Patient's hemoglobin has responded very well to IV iron with improvement in her hemoglobin from 7.2 to 12.4 and now is stable at 13.2 with nl MCV . Ferritin levels were elevated to 800's post IV Iron and likely as an acute phase reactant.  #2 severe iron deficiency likely due to chronic GI bleeding + poor oral iron absorption. She has a EGD and colonoscopy in 2013.  Previous history of gastric ulcer requiring blood transfusions.  Also send gastric polyps and a large hernia and prone to developing Cameron ulcers, previous colonic polyps and previous bleeding hemorrhoids.  Patient reports having recent EGD and colonoscopy with [name] in April 2017 which showed a large gastric ulcer. She reports that it was noted to be benign and Helicobacter pylori negative. The reports of the studies and the pathology are not available to [place] at  this time. Patient notes that she has had f/u EGD which showed that the GU has healed - EGD read not available to [place] currently.  #3 history of poor tolerance to oral iron with significant GI distress.  She is also on chronic PPI therapy that puts her at higher risk of developing iron deficiency due to poor absorption.  And also B12 deficiency and possible vitamin D deficiency.   PLAN: -Discussed pt labwork today, 07/25/2020; not anemic, counts stable, chemistries normal. - Iron labs05/26/2022 Iron sat 23%. 07/25/2020 Ferritin 248 - no indication for additional IV iron at this time. -Goal Ferritin >100.  -Recommended pt continue to stay up to date with all age related cancer screenings. -Continue to f/u w Dr. Estanislado Pandy regarding RA management. -Will see back as needed.    FOLLOW UP: Continue f/u with PCP and rheumatology RTC with Dr Irene Limbo as needed   The total time spent in the appt was 20 minutes and more than 50% was on counseling and direct patient cares.  All of the patient's questions were  answered with apparent satisfaction. The patient knows to call the clinic with any problems, questions or concerns.    Sullivan Lone MD [place] AAHIVMS [facility] Hematology/Oncology Physician [place]  (Office):       [phone] (Work cell):  [phone] (Fax):           [phone]  07/25/2020 3:02 PM   I, Reinaldo Raddle, am acting as scribe for Dr. Sullivan Lone, MD.   .I have reviewed the above documentation for accuracy and completeness, and I agree with the above. Brunetta Genera MD

## 2020-07-25 ENCOUNTER — Inpatient Hospital Stay: Payer: Medicare Other | Attending: Hematology | Admitting: Hematology

## 2020-07-25 ENCOUNTER — Inpatient Hospital Stay: Payer: Medicare Other

## 2020-07-25 ENCOUNTER — Other Ambulatory Visit: Payer: Self-pay

## 2020-07-25 VITALS — BP 152/52 | HR 75 | Temp 97.6°F | Resp 18 | Wt 211.9 lb

## 2020-07-25 DIAGNOSIS — E538 Deficiency of other specified B group vitamins: Secondary | ICD-10-CM | POA: Insufficient documentation

## 2020-07-25 DIAGNOSIS — G629 Polyneuropathy, unspecified: Secondary | ICD-10-CM | POA: Insufficient documentation

## 2020-07-25 DIAGNOSIS — Z8719 Personal history of other diseases of the digestive system: Secondary | ICD-10-CM | POA: Diagnosis not present

## 2020-07-25 DIAGNOSIS — G479 Sleep disorder, unspecified: Secondary | ICD-10-CM | POA: Insufficient documentation

## 2020-07-25 DIAGNOSIS — Z79899 Other long term (current) drug therapy: Secondary | ICD-10-CM | POA: Insufficient documentation

## 2020-07-25 DIAGNOSIS — K922 Gastrointestinal hemorrhage, unspecified: Secondary | ICD-10-CM | POA: Diagnosis not present

## 2020-07-25 DIAGNOSIS — K909 Intestinal malabsorption, unspecified: Secondary | ICD-10-CM | POA: Diagnosis not present

## 2020-07-25 DIAGNOSIS — Z833 Family history of diabetes mellitus: Secondary | ICD-10-CM | POA: Insufficient documentation

## 2020-07-25 DIAGNOSIS — K317 Polyp of stomach and duodenum: Secondary | ICD-10-CM | POA: Diagnosis not present

## 2020-07-25 DIAGNOSIS — R5383 Other fatigue: Secondary | ICD-10-CM | POA: Diagnosis not present

## 2020-07-25 DIAGNOSIS — I10 Essential (primary) hypertension: Secondary | ICD-10-CM | POA: Diagnosis not present

## 2020-07-25 DIAGNOSIS — Z8249 Family history of ischemic heart disease and other diseases of the circulatory system: Secondary | ICD-10-CM | POA: Diagnosis not present

## 2020-07-25 DIAGNOSIS — M797 Fibromyalgia: Secondary | ICD-10-CM | POA: Insufficient documentation

## 2020-07-25 DIAGNOSIS — M1990 Unspecified osteoarthritis, unspecified site: Secondary | ICD-10-CM | POA: Insufficient documentation

## 2020-07-25 DIAGNOSIS — Z9049 Acquired absence of other specified parts of digestive tract: Secondary | ICD-10-CM | POA: Diagnosis not present

## 2020-07-25 DIAGNOSIS — Z8711 Personal history of peptic ulcer disease: Secondary | ICD-10-CM | POA: Insufficient documentation

## 2020-07-25 DIAGNOSIS — Z90722 Acquired absence of ovaries, bilateral: Secondary | ICD-10-CM | POA: Insufficient documentation

## 2020-07-25 DIAGNOSIS — Z8261 Family history of arthritis: Secondary | ICD-10-CM | POA: Diagnosis not present

## 2020-07-25 DIAGNOSIS — D500 Iron deficiency anemia secondary to blood loss (chronic): Secondary | ICD-10-CM | POA: Insufficient documentation

## 2020-07-25 DIAGNOSIS — Z881 Allergy status to other antibiotic agents status: Secondary | ICD-10-CM | POA: Insufficient documentation

## 2020-07-25 DIAGNOSIS — Z8049 Family history of malignant neoplasm of other genital organs: Secondary | ICD-10-CM | POA: Diagnosis not present

## 2020-07-25 DIAGNOSIS — E611 Iron deficiency: Secondary | ICD-10-CM | POA: Diagnosis not present

## 2020-07-25 DIAGNOSIS — M8580 Other specified disorders of bone density and structure, unspecified site: Secondary | ICD-10-CM | POA: Insufficient documentation

## 2020-07-25 DIAGNOSIS — Z823 Family history of stroke: Secondary | ICD-10-CM | POA: Insufficient documentation

## 2020-07-25 DIAGNOSIS — Z800 Family history of malignant neoplasm of digestive organs: Secondary | ICD-10-CM | POA: Insufficient documentation

## 2020-07-25 DIAGNOSIS — Z8349 Family history of other endocrine, nutritional and metabolic diseases: Secondary | ICD-10-CM | POA: Diagnosis not present

## 2020-07-25 DIAGNOSIS — D509 Iron deficiency anemia, unspecified: Secondary | ICD-10-CM

## 2020-07-25 LAB — IRON AND TIBC
Iron: 74 ug/dL (ref 41–142)
Saturation Ratios: 23 % (ref 21–57)
TIBC: 320 ug/dL (ref 236–444)
UIBC: 246 ug/dL (ref 120–384)

## 2020-07-25 LAB — CBC WITH DIFFERENTIAL (CANCER CENTER ONLY)
Abs Immature Granulocytes: 0.01 10*3/uL (ref 0.00–0.07)
Basophils Absolute: 0 10*3/uL (ref 0.0–0.1)
Basophils Relative: 1 %
Eosinophils Absolute: 0 10*3/uL (ref 0.0–0.5)
Eosinophils Relative: 1 %
HCT: 37.8 % (ref 36.0–46.0)
Hemoglobin: 13.8 g/dL (ref 12.0–15.0)
Immature Granulocytes: 0 %
Lymphocytes Relative: 28 %
Lymphs Abs: 1.7 10*3/uL (ref 0.7–4.0)
MCH: 34.1 pg — ABNORMAL HIGH (ref 26.0–34.0)
MCHC: 36.5 g/dL — ABNORMAL HIGH (ref 30.0–36.0)
MCV: 93.3 fL (ref 80.0–100.0)
Monocytes Absolute: 0.4 10*3/uL (ref 0.1–1.0)
Monocytes Relative: 6 %
Neutro Abs: 3.9 10*3/uL (ref 1.7–7.7)
Neutrophils Relative %: 64 %
Platelet Count: 291 10*3/uL (ref 150–400)
RBC: 4.05 MIL/uL (ref 3.87–5.11)
RDW: 12.5 % (ref 11.5–15.5)
WBC Count: 6.1 10*3/uL (ref 4.0–10.5)
nRBC: 0 % (ref 0.0–0.2)

## 2020-07-25 LAB — CMP (CANCER CENTER ONLY)
ALT: 11 U/L (ref 0–44)
AST: 15 U/L (ref 15–41)
Albumin: 4.1 g/dL (ref 3.5–5.0)
Alkaline Phosphatase: 102 U/L (ref 38–126)
Anion gap: 8 (ref 5–15)
BUN: 18 mg/dL (ref 8–23)
CO2: 28 mmol/L (ref 22–32)
Calcium: 9.7 mg/dL (ref 8.9–10.3)
Chloride: 104 mmol/L (ref 98–111)
Creatinine: 0.8 mg/dL (ref 0.44–1.00)
GFR, Estimated: >60 mL/min (ref >60)
Glucose, Bld: 115 mg/dL — ABNORMAL HIGH (ref 70–99)
Potassium: 4 mmol/L (ref 3.5–5.1)
Sodium: 140 mmol/L (ref 135–145)
Total Bilirubin: 0.7 mg/dL (ref 0.3–1.2)
Total Protein: 8 g/dL (ref 6.5–8.1)

## 2020-07-25 LAB — FERRITIN: Ferritin: 248 ng/mL (ref 11–307)

## 2020-07-26 ENCOUNTER — Ambulatory Visit (HOSPITAL_BASED_OUTPATIENT_CLINIC_OR_DEPARTMENT_OTHER): Payer: Medicare Other | Admitting: Obstetrics & Gynecology

## 2020-07-26 ENCOUNTER — Other Ambulatory Visit: Payer: Self-pay | Admitting: Podiatry

## 2020-07-26 ENCOUNTER — Other Ambulatory Visit: Payer: Self-pay | Admitting: Rheumatology

## 2020-07-26 NOTE — Telephone Encounter (Signed)
Next Visit: 08/21/2020  Last Visit: 03/20/2020  Last Fill: 10/18/2019  DX:   Psoriatic arthritis   Current Dose per office note 03/20/2020, methotrexate 0.3 ml every 7 days  Labs: 5/26/022, MCH 34.1, MCHC 36.5, Glucose 115,   Okay to refill MTX?

## 2020-07-31 ENCOUNTER — Encounter: Payer: Self-pay | Admitting: Hematology

## 2020-08-07 NOTE — Progress Notes (Signed)
Office Visit Note  Patient: Jade Cardenas             Date of Birth: April 27, 1950           MRN: [id number]             PCP: [name], MD Referring: [name], MD Visit Date: 08/21/2020 Occupation: @GUAROCC @  Subjective:  Medication management.   History of Present Illness: Jade Cardenas is a 70 y.o. female with history of psoriatic arthritis, psoriasis and osteoarthritis.  She has been taking Simponi and subcutaneous injections with methotrexate on a regular basis.  She denies any joint swelling.  She denies any psoriasis rash.  She continues to have some discomfort in her knee joints, neck and lower back.  She states her neuropathy has been better since she has been taking Cymbalta.  Cymbalta also helped her mood.  Patient reports developing a tick bite on the left side of her abdomen few weeks back.  She states she developed some redness which is resolving.  Activities of Daily Living:  Patient reports morning stiffness for 2-3 hours.   Patient Reports nocturnal pain.  Difficulty dressing/grooming: Denies Difficulty climbing stairs: Reports Difficulty getting out of chair: Denies Difficulty using hands for taps, buttons, cutlery, and/or writing: Reports  Review of Systems  Constitutional:  Positive for fatigue.  HENT:  Positive for mouth dryness and nose dryness. Negative for mouth sores.   Eyes:  Positive for dryness. Negative for pain and itching.  Respiratory:  Negative for shortness of breath and difficulty breathing.   Cardiovascular:  Negative for chest pain and palpitations.  Gastrointestinal:  Negative for blood in stool, constipation and diarrhea.  Endocrine: Negative for increased urination.  Genitourinary:  Negative for difficulty urinating.  Musculoskeletal:  Positive for joint pain, joint pain and morning stiffness. Negative for joint swelling, myalgias, muscle tenderness and myalgias.  Skin:  Negative for color change, rash and redness.   Allergic/Immunologic: Negative for susceptible to infections.  Neurological:  Positive for numbness and headaches. Negative for dizziness, memory loss and weakness.  Hematological:  Positive for bruising/bleeding tendency.  Psychiatric/Behavioral:  Positive for sleep disturbance. Negative for confusion.    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   Fibromyalgia 05/17/2016   DJD (degenerative joint disease), cervical 02/18/2016   Heel spur 09/29/2015   Ingrown toenail 09/29/2015   Foraminal stenosis of cervical region 07/30/2015   Microcytic hypochromic anemia 05/22/2015   Tailor's bunion, left 01/18/2015   H/O hiatal hernia    Bilateral primary osteoarthritis of knee 12/15/2013   Iron deficiency 12/15/2013   Psoriatic arthritis ([place]) 12/15/2013   History of foot fracture 12/15/2013   Dermatitis 10/17/2013   Hx of diethylstilbestrol (DES) exposure in utero unknown 12/09/2012   Tinnitus 06/23/2012   Scalp lesion 01/21/2012   Headache(784.0) 01/21/2012   Hx of psoriasis 01/21/2012   Weight gain 01/21/2012   Ankylosing spondylitis  possible 09/03/2011   Hx of transfusion 09/03/2011   S/P lumbar fusion 6 13 09/03/2011   Lumbar stenosis with neurogenic claudication 08/05/2011   ALLERGIC RHINITIS 02/19/2010   GERD 02/19/2010   SPINAL STENOSIS, CERVICAL 02/19/2010   OSTEOPENIA 02/19/2010  IBS 04/28/2007   History of gastric ulcer 02/09/2007   COLONIC POLYPS, HX OF 02/09/2007   CHOLECYSTECTOMY, HX OF 02/09/2007   Hyperlipidemia 12/29/2006   Essential hypertension 12/29/2006    Past Medical History:  Diagnosis Date   Anemia    iron def    Arthritis    rheumatoid, psoriatic, osteoarthritis    Bone spur    RIGHT FOOT    Broken toe    Bruises easily    Cellulitis    Chronic back pain    COLONIC POLYPS, HX OF 02/09/2007   Cough, persistent 01/21/2012   poss from acei    DES exposure in utero, unknown    Femur fracture, left (HCC)    Fibromyalgia    doesn't require meds   GASTRIC ULCER, ACUTE, HEMORRHAGE, HX OF 02/09/2007   GERD (gastroesophageal reflux disease)    takes Nexium daily   Goiter    H/O hiatal hernia    Hx of seasonal allergies    takes Claritin daily   HYPERLIPIDEMIA 12/29/2006   takes Lovastatin nightly   HYPERTENSION 12/29/2006   takes Enalapril daily   IBS 04/28/2007   Insomnia    related to pain;takes Flexeril and Tylenol PM nightly   Joint pain    Joint swelling    Osteopenia    Palpitations 02/09/2007   PONV (postoperative nausea and vomiting)    NAUSEA   REDUCTION MAMMOPLASTY, HX OF 02/09/2007   Right bundle branch block 02/09/2007   S/P lumbar fusion 6 13 09/03/2011   L4 L5  posterior    SYNCOPE 12/07/2007   Tendonitis    RLE    UNS ADVRS EFF OTH RX MEDICINAL\T\BIOLOGICAL SBSTNC 02/09/2007    Family History  Problem Relation Age of Onset   Diabetes Mother    Colon cancer Mother        x2   Arthritis Mother    Hypertension Mother    Endometrial cancer Mother    Stroke Father    Heart disease Father    Colon cancer Father    Arthritis Brother    Hypertension Brother    Hyperlipidemia Brother    Infertility Brother    Diabetes Brother    Other Other        DISH brother   Hypertension Maternal Grandmother    Stroke Maternal Grandmother    Anesthesia problems Neg Hx    Hypotension Neg Hx    Malignant hyperthermia Neg Hx    Pseudochol deficiency Neg Hx    Past Surgical History:  Procedure Laterality Date   BACK SURGERY  12+yrs ago   Synovial Cyst removal   BACK SURGERY  6/13   lumbar L4-L5 fusion, lamenectomy   bone spur  6-10yrs ago   right ankle   BUNIONECTOMY   10/03/14   right foot   CERVICAL FUSION     2 12    CHOLECYSTECTOMY  1996   HARDWARE REMOVAL Left 08/01/2018   Procedure: Left knee hardware removal;  Surgeon: Gaynelle Arabian, MD;  Location: [facility];  Service: Orthopedics;  Laterality: Left;  81min   JOINT REPLACEMENT     KNEE ARTHROPLASTY     POSTERIOR CERVICAL LAMINECTOMY Left 07/30/2015   Procedure: Laminectomy and Foraminotomy - left - Cervical two -Cervical three;  Surgeon: Earnie Larsson, MD;  Location: [facility];  Service: Neurosurgery;  Laterality: Left;   REDUCTION MAMMAPLASTY Bilateral 1989   TONSILLECTOMY     as a child   TOTAL ABDOMINAL HYSTERECTOMY W/ BILATERAL  SALPINGOOPHORECTOMY  1986   TOTAL KNEE ARTHROPLASTY N/A 06/21/2017   Procedure: LEFT TOTAL KNEE ARTHROPLASTY AND RIGHT KNEE CORTISONE INJECTION;  Surgeon: Gaynelle Arabian, MD;  Location: [facility];  Service: Orthopedics;  Laterality: N/A;   Social History   Social History Narrative   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   Widow  Husband died suddenly 06/14/2008   Non smoker   Immunization History  Administered Date(s) Administered   Fluad Quad(high Dose 65+) 11/04/2018, 11/07/2019   Influenza Split 11/29/2012, 11/27/2013   Influenza Whole 11/09/2008   Influenza, High Dose Seasonal PF 11/26/2015, 11/30/2016, 11/03/2017   Influenza,inj,Quad PF,6+ Mos 11/27/2014   Influenza-Unspecified 12/22/2016   PFIZER Comirnaty(Gray Top)Covid-19 Tri-Sucrose Vaccine 03/28/2020   PFIZER(Purple Top)SARS-COV-2 Vaccination 04/08/2019, 05/03/2019, 10/19/2019   PPD Test 12/18/2013, 11/27/2014, 11/26/2015, 11/30/2016   Pneumococcal Conjugate-13 02/28/2013   Pneumococcal Polysaccharide-23 03/03/2003, 02/28/2014, 11/07/2019   Td 05/09/2008   Tdap 07/19/2018   Zoster Recombinat (Shingrix) 12/03/2016, 02/04/2017   Zoster, Live 01/20/2011     Objective: Vital Signs: BP 137/81 (BP Location: Left Arm, Patient Position: Sitting, Cuff Size: Normal)    Pulse 76   Resp 16   Ht 5\' 4"  (1.626 m)   Wt 207 lb 9.6 oz (94.2 kg)   LMP 03/02/1984   BMI 35.63 kg/m    Physical Exam Vitals and nursing note reviewed.  Constitutional:      Appearance: She is well-developed.  HENT:     Head: Normocephalic and atraumatic.  Eyes:     Conjunctiva/sclera: Conjunctivae normal.  Cardiovascular:     Rate and Rhythm: Normal rate and regular rhythm.     Heart sounds: Normal heart sounds.  Pulmonary:     Effort: Pulmonary effort is normal.     Breath sounds: Normal breath sounds.  Abdominal:     General: Bowel sounds are normal.     Palpations: Abdomen is soft.  Musculoskeletal:     Cervical back: Normal range of motion.  Lymphadenopathy:     Cervical: No cervical adenopathy.  Skin:    General: Skin is warm and dry.     Capillary Refill: Capillary refill takes less than 2 seconds.  Neurological:     Mental Status: She is alert and oriented to person, place, and time.  Psychiatric:        Behavior: Behavior normal.     Musculoskeletal Exam: She has limitation with lateral rotation of her cervical spine.  Lumbar spine was difficult to assess.  Shoulder joints, elbow joints, wrist joints, MCPs PIPs and DIPs with good range of motion with no synovitis.  Hip joints, knee joints, ankles, MTPs and PIPs with good range of motion with no synovitis.  CDAI Exam: CDAI Score: -- Patient Global: --; Provider Global: -- Swollen: --; Tender: -- Joint Exam 08/21/2020   No joint exam has been documented for this visit   There is currently no information documented on the homunculus. Go to the Rheumatology activity and complete the homunculus joint exam.  Investigation: No additional findings.  Imaging: No results found.  Recent Labs: Lab Results  Component Value Date   WBC 6.1 07/25/2020   HGB 13.8 07/25/2020   PLT 291 07/25/2020   NA 140 07/25/2020   K 4.0 07/25/2020   CL 104 07/25/2020   CO2 28 07/25/2020   GLUCOSE 115 (H) 07/25/2020   BUN  18 07/25/2020   CREATININE 0.80 07/25/2020   BILITOT 0.7 07/25/2020   ALKPHOS 102 07/25/2020  AST 15 07/25/2020   ALT 11 07/25/2020   PROT 8.0 07/25/2020   ALBUMIN 4.1 07/25/2020   CALCIUM 9.7 07/25/2020   GFRAA 87 02/02/2020   QFTBGOLDPLUS NEGATIVE 10/16/2019    Speciality Comments: Failed therapy: Arava and Otezla due to diarrhea  Procedures:  No procedures performed Allergies: Erythromycin, Iodinated diagnostic agents, Ace inhibitors, Keflex [cephalexin], and Macrobid [nitrofurantoin]   Assessment / Plan:     Visit Diagnoses: Psoriatic arthritis (HCC)-she had no synovitis on my examination.  She has been tolerating medications well.  Hx of psoriasis-she had no active lesions of psoriasis.  High risk medication use - Simponi 50 mg every 28 days and methotrexate 0.3 ml every 7 days.Jolee Ewing and [place] caused diarrhea).  - Plan: QuantiFERON-TB Gold Plus with August labs.  Her labs in May were within normal limits.  She will get labs in August which will include CBC with differential and CMP with GFR and then every 3 months to monitor for drug toxicity.  She has been also advised to get yearly skin examination to rule out nonmelanoma skin cancer.  Patient states she sees dermatologist on a regular basis.  She is fully vaccinated against COVID-19.  Instructions regarding other immunization were also placed in the AVS.  She has been advised to stop Simponi and methotrexate in case she develops an infection and resume the medications once the infection resolves.  Primary osteoarthritis of right knee - moderate osteoarthritis and severe chondromalacia patella.  She uses a cane for mobility.  Total knee replacement status, left - by Dr. Wynelle Link.  Doing well.  DDD (degenerative disc disease), cervical - S/P fusion.  She has limited range of motion.  She continues to have some discomfort.  DDD (degenerative disc disease), lumbar - S/P fusion.  She has limited range of motion with  discomfort.  Fibromyalgia-she continues to have some generalized pain and discomfort from fibromyalgia.  Chronic fatigue-she states the fatigue is manageable.  She has 2 dogs now which keeps her active.  She has been walking on a regular basis.  Osteopenia of multiple sites - DEXA 09/15/17 osteopenia T -1.8.  Followed by her GYN.  She will get repeat DEXA scan with Dr. Sabra Heck.  She will get DEXA scan later this year.  Other iron deficiency anemia  Tick bite-she developed a tick bite on the left side of her abdomen.  She had mild redness around the area but no bull's-eye lesion was noted.  She denies any history of any other rash or fever.  I advised her to contact her PCP in case she develops new symptoms.  Orders: Orders Placed This Encounter  Procedures   QuantiFERON-TB Gold Plus   No orders of the defined types were placed in this encounter.    Follow-Up Instructions: Return in about 5 months (around 01/21/2021) for Psoriatic arthritis, Osteoarthritis.   Bo Merino, MD  Note - This record has been created using Editor, commissioning.  Chart creation errors have been sought, but may not always  have been located. Such creation errors do not reflect on  the standard of medical care.

## 2020-08-21 ENCOUNTER — Encounter: Payer: Self-pay | Admitting: Rheumatology

## 2020-08-21 ENCOUNTER — Ambulatory Visit (INDEPENDENT_AMBULATORY_CARE_PROVIDER_SITE_OTHER): Payer: Medicare Other | Admitting: Rheumatology

## 2020-08-21 ENCOUNTER — Other Ambulatory Visit: Payer: Self-pay

## 2020-08-21 VITALS — BP 137/81 | HR 76 | Resp 16 | Ht 64.0 in | Wt 207.6 lb

## 2020-08-21 DIAGNOSIS — M8589 Other specified disorders of bone density and structure, multiple sites: Secondary | ICD-10-CM | POA: Insufficient documentation

## 2020-08-21 DIAGNOSIS — Z79899 Other long term (current) drug therapy: Secondary | ICD-10-CM

## 2020-08-21 DIAGNOSIS — W57XXXD Bitten or stung by nonvenomous insect and other nonvenomous arthropods, subsequent encounter: Secondary | ICD-10-CM

## 2020-08-21 DIAGNOSIS — M51369 Other intervertebral disc degeneration, lumbar region without mention of lumbar back pain or lower extremity pain: Secondary | ICD-10-CM

## 2020-08-21 DIAGNOSIS — Z96652 Presence of left artificial knee joint: Secondary | ICD-10-CM

## 2020-08-21 DIAGNOSIS — R5382 Chronic fatigue, unspecified: Secondary | ICD-10-CM | POA: Diagnosis not present

## 2020-08-21 DIAGNOSIS — L4050 Arthropathic psoriasis, unspecified: Secondary | ICD-10-CM | POA: Diagnosis not present

## 2020-08-21 DIAGNOSIS — M797 Fibromyalgia: Secondary | ICD-10-CM | POA: Diagnosis not present

## 2020-08-21 DIAGNOSIS — S30861D Insect bite (nonvenomous) of abdominal wall, subsequent encounter: Secondary | ICD-10-CM | POA: Diagnosis not present

## 2020-08-21 DIAGNOSIS — M5030 Other cervical disc degeneration, unspecified cervical region: Secondary | ICD-10-CM

## 2020-08-21 DIAGNOSIS — M5136 Other intervertebral disc degeneration, lumbar region: Secondary | ICD-10-CM

## 2020-08-21 DIAGNOSIS — M1711 Unilateral primary osteoarthritis, right knee: Secondary | ICD-10-CM | POA: Diagnosis not present

## 2020-08-21 DIAGNOSIS — D508 Other iron deficiency anemias: Secondary | ICD-10-CM

## 2020-08-21 DIAGNOSIS — Z872 Personal history of diseases of the skin and subcutaneous tissue: Secondary | ICD-10-CM | POA: Diagnosis not present

## 2020-08-21 NOTE — Patient Instructions (Addendum)
Standing Labs We placed an order today for your standing lab work.   Please have your standing labs drawn in August and every 3 months  TB Gold in August  If possible, please have your labs drawn 2 weeks prior to your appointment so that the provider can discuss your results at your appointment.  Please note that you may see your imaging and lab results in [place] before we have reviewed them. We may be awaiting multiple results to interpret others before contacting you. Please allow our office up to 72 hours to thoroughly review all of the results before contacting the office for clarification of your results.  We have open lab daily: Monday through Thursday from 1:30-4:30 PM and Friday from 1:30-4:00 PM at the office of Dr. Bo Merino, [facility].   Please be advised, all patients with office appointments requiring lab work will take precedent over walk-in lab work.  If possible, please come for your lab work on Monday and Friday afternoons, as you may experience shorter wait times. The office is located at [address] [place], [place],  [address] No appointment is necessary.   Labs are drawn by [facility]. Please bring your co-pay at the time of your lab draw.  You may receive a bill from [facility] for your lab work.  If you wish to have your labs drawn at another location, please call the office 24 hours in advance to send orders.  If you have any questions regarding directions or hours of operation,  please call [phone].   As a reminder, please drink plenty of water prior to coming for your lab work. Thanks!    Vaccines You are taking a medication(s) that can suppress your immune system.  The following immunizations are recommended: Flu annually Covid-19  Td/Tdap (tetanus, diphtheria, pertussis) every 10 years Pneumonia (Prevnar 15 then Pneumovax 23 at least 1 year apart.  Alternatively, can take Prevnar 20 without needing additional dose) Shingrix  (after age 37): 2 doses from 4 weeks to 6 months apart  Please check with your PCP to make sure you are up to date.   If you test POSITIVE for COVID19 and have MILD to MODERATE symptoms: First, call your PCP if you would like to receive COVID19 treatment AND Hold your medications during the infection and for at least 1 week after your symptoms have resolved: Injectable medication (Benlysta, Cimzia, Cosentyx, Enbrel, Humira, Orencia, Remicade, Simponi, Stelara, Taltz, Tremfya) Methotrexate Leflunomide (Arava) Mycophenolate (Cellcept) Morrie Sheldon, Olumiant, or Rinvoq If you take Actemra or Kevzara, you DO NOT need to hold these for COVID19 infection.  If you test POSITIVE for COVID19 and have NO symptoms: First, call your PCP if you would like to receive COVID19 treatment AND Hold your medications for at least 10 days after the day that you tested positive Injectable medication (Benlysta, Cimzia, Cosentyx, Enbrel, Humira, Orencia, Remicade, Simponi, Stelara, Taltz, Tremfya) Methotrexate Leflunomide (Arava) Mycophenolate (Cellcept) Morrie Sheldon, Olumiant, or Rinvoq If you take Actemra or Kevzara, you DO NOT need to hold these for COVID19 infection.  If you have signs or symptoms of an infection or start antibiotics: First, call your PCP for workup of your infection. Hold your medication through the infection, until you complete your antibiotics, and until symptoms resolve if you take the following: Injectable medication (Actemra, Benlysta, Cimzia, Cosentyx, Enbrel, Humira, Kevzara, Orencia, Remicade, Simponi, Stelara, Taltz, Tremfya) Methotrexate Leflunomide (Arava) Mycophenolate (Cellcept) Roma Kayser, or Rinvoq  Heart Disease Prevention   Your inflammatory disease increases your  risk of heart disease which includes heart attack, stroke, atrial fibrillation (irregular heartbeats), high blood pressure, heart failure and atherosclerosis (plaque in the arteries).  It is important to reduce  your risk by:   Keep blood pressure, cholesterol, and blood sugar at healthy levels   Smoking Cessation   Maintain a healthy weight  BMI 20-25   Eat a healthy diet  Plenty of fresh fruit, vegetables, and whole grains  Limit saturated fats, foods high in sodium, and added sugars  DASH and Mediterranean diet   Increase physical activity  Recommend moderate physically activity for 150 minutes per week/ 30 minutes a day for five days a week These can be broken up into three separate ten-minute sessions during the day.   Reduce Stress  Meditation, slow breathing exercises, yoga, coloring books  Dental visits twice a year

## 2020-09-03 ENCOUNTER — Other Ambulatory Visit: Payer: Self-pay

## 2020-09-03 ENCOUNTER — Ambulatory Visit (INDEPENDENT_AMBULATORY_CARE_PROVIDER_SITE_OTHER): Payer: Medicare Other | Admitting: Podiatry

## 2020-09-03 ENCOUNTER — Ambulatory Visit (INDEPENDENT_AMBULATORY_CARE_PROVIDER_SITE_OTHER): Payer: Medicare Other

## 2020-09-03 DIAGNOSIS — G629 Polyneuropathy, unspecified: Secondary | ICD-10-CM

## 2020-09-03 DIAGNOSIS — M778 Other enthesopathies, not elsewhere classified: Secondary | ICD-10-CM | POA: Diagnosis not present

## 2020-09-03 NOTE — Patient Instructions (Signed)
Duloxetine Delayed-Release Capsules What is this medication? DULOXETINE (doo LOX e teen) treats depression, anxiety, fibromyalgia, and certain types of chronic pain such as nerve, bone, or joint pain. It increases the amount of serotonin and norepinephrine in the brain, hormones that helpregulate mood and pain. It belongs to a group of medications called SNRIs. This medicine may be used for other purposes; ask your health care provider orpharmacist if you have questions. COMMON BRAND NAME(S): Cymbalta, Drizalma, Irenka What should I tell my care team before I take this medication? They need to know if you have any of these conditions: Bipolar disorder Glaucoma High blood pressure Kidney disease Liver disease Seizures Suicidal thoughts, plans or attempt; a previous suicide attempt by you or a family member Take medications that treat or prevent blood clots Taken medications called MAOIs like Carbex, Eldepryl, Marplan, Nardil, and Parnate within 14 days Trouble passing urine An unusual reaction to duloxetine, other medications, foods, dyes, or preservatives Pregnant or trying to get pregnant Breast-feeding How should I use this medication? Take this medication by mouth with a glass of water. Follow the directions on the prescription label. Do not crush, cut or chew some capsules of this medication. Some capsules may be opened and sprinkled on applesauce. Check with your care team or pharmacist if you are not sure. You can take this medication with or without food. Take your medication at regular intervals. Do not take your medication more often than directed. Do not stop taking this medication suddenly except upon the advice of your care team. Stopping this medication tooquickly may cause serious side effects or your condition may worsen. A special MedGuide will be given to you by the pharmacist with eachprescription and refill. Be sure to read this information carefully each time. Talk to your  care team regarding the use of this medication in children. While this medication may be prescribed for children as young as 48 years of age forselected conditions, precautions do apply. Overdosage: If you think you have taken too much of this medicine contact apoison control center or emergency room at once. NOTE: This medicine is only for you. Do not share this medicine with others. What if I miss a dose? If you miss a dose, take it as soon as you can. If it is almost time for yournext dose, take only that dose. Do not take double or extra doses. What may interact with this medication? Do not take this medication with any of the following: Desvenlafaxine Levomilnacipran Linezolid MAOIs like Carbex, Eldepryl, Emsam, Marplan, Nardil, and Parnate Methylene blue (injected into a vein) Milnacipran Safinamide Thioridazine Venlafaxine Viloxazine This medication may also interact with the following: Alcohol Amphetamines Aspirin and aspirin-like medications Certain antibiotics like ciprofloxacin and enoxacin Certain medications for blood pressure, heart disease, irregular heart beat Certain medications for depression, anxiety, or psychotic disturbances Certain medications for migraine headache like almotriptan, eletriptan, frovatriptan, naratriptan, rizatriptan, sumatriptan, zolmitriptan Certain medications that treat or prevent blood clots like warfarin, enoxaparin, and dalteparin Cimetidine Fentanyl Lithium NSAIDS, medications for pain and inflammation, like ibuprofen or naproxen Phentermine Procarbazine Rasagiline Sibutramine St. John's wort Theophylline Tramadol Tryptophan This list may not describe all possible interactions. Give your health care provider a list of all the medicines, herbs, non-prescription drugs, or dietary supplements you use. Also tell them if you smoke, drink alcohol, or use illegaldrugs. Some items may interact with your medicine. What should I watch for  while using this medication? Tell your care team if your symptoms do not get  better or if they get worse. Visit your care team for regular checks on your progress. Because it may take several weeks to see the full effects of this medication, it is important tocontinue your treatment as prescribed by your care team. This medication may cause serious skin reactions. They can happen weeks to months after starting the medication. Contact your care team right away if you notice fevers or flu-like symptoms with a rash. The rash may be red or purple and then turn into blisters or peeling of the skin. Or, you might notice a red rash with swelling of the face, lips, or lymph nodes in your neck or under yourarms. Watch for new or worsening thoughts of suicide or depression. This includes sudden changes in mood, behaviors, or thoughts. These changes can happen at any time but are more common in the beginning of treatment or after a change in dose. Call your care team right away if you experience these thoughts orworsening depression. Manic episodes may happen in patients with bipolar disorder who take this medication. Watch for changes in feelings or behaviors such as feeling anxious, nervous, agitated, panicky, irritable, hostile, aggressive, impulsive, severely restless, overly excited and hyperactive, or trouble sleeping. These symptoms can happen at any time, but are more common in the beginning of treatment or after a change in dose. Call your care team right away if you notice any ofthese symptoms. You may get drowsy or dizzy. Do not drive, use machinery, or do anything that needs mental alertness until you know how this medication affects you. Do not stand or sit up quickly, especially if you are an older patient. This reduces the risk of dizzy or fainting spells. Alcohol may interfere with the effect ofthis medication. Avoid alcoholic drinks. This medication may increase blood sugar. The risk may be higher in  patients who already have diabetes. Ask your care team what you can do to lower yourrisk of diabetes while taking this medication. This medication can cause an increase in blood pressure. This medication can also cause a sudden drop in your blood pressure, which may make you feel faint and increase the chance of a fall. These effects are most common when you first start the medication or when the dose is increased, or during use of other medications that can cause a sudden drop in blood pressure. Check with your care team for instructions on monitoring your blood pressure while taking thismedication. Your mouth may get dry. Chewing sugarless gum or sucking hard candy, and drinking plenty of water, may help. Contact your care team if the problem doesnot go away or is severe. What side effects may I notice from receiving this medication? Side effects that you should report to your care team as soon as possible: Allergic reactions-skin rash, itching, hives, swelling of the face, lips, tongue, or throat Bleeding-bloody or black, tar-like stools, red or dark brown urine, vomiting blood or brown material that looks like coffee grounds, small, red or purple spots on skin, unusual bleeding or bruising Increase in blood pressure Liver injury-right upper belly pain, loss of appetite, nausea, light-colored stool, dark yellow or brown urine, yellowing skin or eyes, unusual weakness or fatigue Low sodium level-muscle weakness, fatigue, dizziness, headache, confusion Redness, blistering, peeling, or loosening of the skin, including inside the mouth Serotonin syndrome-irritability, confusion, fast or irregular heartbeat, muscle stiffness, twitching muscles, sweating, high fever, seizures, chills, vomiting, diarrhea Sudden eye pain or change in vision such as blurry vision, seeing halos around lights, vision  loss Thoughts of suicide or self-harm, worsening mood, feelings of depression Trouble passing urine Side  effects that usually do not require medical attention (report to your careteam if they continue or are bothersome): Change in sex drive or performance Constipation Diarrhea Dizziness Dry mouth Excessive sweating Loss of appetite Nausea Vomiting This list may not describe all possible side effects. Call your doctor for medical advice about side effects. You may report side effects to FDA at1-800-FDA-1088. Where should I keep my medication? Keep out of the reach of children and pets. Store at room temperature between 15 and 30 degrees C (59 to 86 degrees F). Getrid of any unused medication after the expiration date. To get rid of medications that are no longer needed or have expired: Take the medication to a medication take-back program. Check with your pharmacy or law enforcement to find a location. If you cannot return the medication, check the label or package insert to see if the medication should be thrown out in the garbage or flushed down the toilet. If you are not sure, ask your care team. If it is safe to put it in the trash, take the medication out of the container. Mix the medication with cat litter, dirt, coffee grounds, or other unwanted substance. Seal the mixture in a bag or container. Put it in the trash. NOTE: This sheet is a summary. It may not cover all possible information. If you have questions about this medicine, talk to your doctor, pharmacist, orhealth care provider.  2022 Elsevier/Gold Standard (2020-02-26 12:32:25)

## 2020-09-04 ENCOUNTER — Encounter (HOSPITAL_BASED_OUTPATIENT_CLINIC_OR_DEPARTMENT_OTHER): Payer: Self-pay

## 2020-09-06 NOTE — Progress Notes (Signed)
Subjective: 70 year old female presents the office today for follow-up evaluation of right foot pain, capsulitis as well as neuropathy.  She states that Cymbalta 30 mg is helping but she is asking about increasing the dose to 60.  She states the injection of the right foot did help for couple of days but the pain did come back.  She also has some discomfort in the top of the foot as well as an occasional swelling.  No redness or warmth and no open sores. Denies any systemic complaints such as fevers, chills, nausea, vomiting. No acute changes since last appointment, and no other complaints at this time.   Objective: AAO x3, NAD DP/PT pulses palpable bilaterally, CRT less than 3 seconds There is no specific area pinpoint tenderness identified today.  Mild discomfort on the left first MPJ without any crepitation with MPJ range of motion.  Mild dorsal discomfort on the top of the foot however no specific area of tenderness.  Flexor and extensor tendons appear to be intact.  MMT 5/5. No pain with calf compression, swelling, warmth, erythema  Assessment: Right foot capsulitis, neuropathy  Plan: -All treatment options discussed with the patient including all alternatives, risks, complications.  -We discussed trying to increase Cymbalta to 60 mg discussed side effects. -I did repeat the x-rays and evidence of acute fracture identified or stress fracture.  We held off on steroid injection today.  Discussed Voltaren gel as well she modifications. -Patient encouraged to call the office with any questions, concerns, change in symptoms.   Trula Slade DPM

## 2020-09-08 ENCOUNTER — Other Ambulatory Visit: Payer: Self-pay | Admitting: Internal Medicine

## 2020-09-23 ENCOUNTER — Encounter: Payer: Self-pay | Admitting: Internal Medicine

## 2020-09-23 ENCOUNTER — Other Ambulatory Visit: Payer: Self-pay | Admitting: Internal Medicine

## 2020-09-24 ENCOUNTER — Other Ambulatory Visit: Payer: Self-pay | Admitting: Internal Medicine

## 2020-09-26 ENCOUNTER — Ambulatory Visit: Payer: Medicare Other

## 2020-10-29 ENCOUNTER — Other Ambulatory Visit: Payer: Self-pay

## 2020-10-29 MED ORDER — SIMPONI 50 MG/0.5ML ~~LOC~~ SOAJ: SUBCUTANEOUS | 0 refills | Status: DC

## 2020-10-29 NOTE — Telephone Encounter (Signed)
Next Visit: 01/29/2021  Last Visit: 08/21/2020  Last Fill: 05/10/2020  DX: Psoriatic arthritis   Current Dose per office note on 08/21/2020: Simponi 50 mg every 28 days  Labs: 07/25/2020 glucose 115, MCH 34.1, MCHC 36.5  TB Gold: 10/16/2019 negative    Okay to refill Simponi?   Patient will update labs (CBC, CMP and TB Gold) tomorrow or Thursday. Lab orders are in place.

## 2020-10-29 NOTE — Telephone Encounter (Signed)
Patient called requesting prescription refill of Simponi to be sent to [facility].    Patient states she will be in for [facility] tomorrow afternoon or Thursday.

## 2020-10-30 ENCOUNTER — Other Ambulatory Visit: Payer: Self-pay | Admitting: *Deleted

## 2020-10-30 DIAGNOSIS — Z79899 Other long term (current) drug therapy: Secondary | ICD-10-CM

## 2020-11-03 LAB — CBC WITH DIFFERENTIAL/PLATELET
Absolute Monocytes: 330 cells/uL (ref 200–950)
Basophils Absolute: 50 cells/uL (ref 0–200)
Basophils Relative: 0.9 %
Eosinophils Absolute: 78 cells/uL (ref 15–500)
Eosinophils Relative: 1.4 %
HCT: 40.3 % (ref 35.0–45.0)
Hemoglobin: 13.5 g/dL (ref 11.7–15.5)
Lymphs Abs: 1646 cells/uL (ref 850–3900)
MCH: 32.6 pg (ref 27.0–33.0)
MCHC: 33.5 g/dL (ref 32.0–36.0)
MCV: 97.3 fL (ref 80.0–100.0)
MPV: 10.4 fL (ref 7.5–12.5)
Monocytes Relative: 5.9 %
Neutro Abs: 3494 cells/uL (ref 1500–7800)
Neutrophils Relative %: 62.4 %
Platelets: 350 10*3/uL (ref 140–400)
RBC: 4.14 10*6/uL (ref 3.80–5.10)
RDW: 13.1 % (ref 11.0–15.0)
Total Lymphocyte: 29.4 %
WBC: 5.6 10*3/uL (ref 3.8–10.8)

## 2020-11-03 LAB — QUANTIFERON-TB GOLD PLUS
Mitogen-NIL: 2.61 IU/mL
NIL: 0.07 IU/mL
QuantiFERON-TB Gold Plus: NEGATIVE
TB1-NIL: <0 IU/mL
TB2-NIL: <0 IU/mL

## 2020-11-03 LAB — COMPLETE METABOLIC PANEL WITH GFR
AG Ratio: 1.6 (calc) (ref 1.0–2.5)
ALT: 17 U/L (ref 6–29)
AST: 16 U/L (ref 10–35)
Albumin: 4.2 g/dL (ref 3.6–5.1)
Alkaline phosphatase (APISO): 94 U/L (ref 37–153)
BUN: 18 mg/dL (ref 7–25)
CO2: 26 mmol/L (ref 20–32)
Calcium: 9.7 mg/dL (ref 8.6–10.4)
Chloride: 105 mmol/L (ref 98–110)
Creat: 0.7 mg/dL (ref 0.50–1.05)
Globulin: 2.7 g/dL (calc) (ref 1.9–3.7)
Glucose, Bld: 103 mg/dL — ABNORMAL HIGH (ref 65–99)
Potassium: 4.5 mmol/L (ref 3.5–5.3)
Sodium: 139 mmol/L (ref 135–146)
Total Bilirubin: 0.8 mg/dL (ref 0.2–1.2)
Total Protein: 6.9 g/dL (ref 6.1–8.1)
eGFR: 94 mL/min/{1.73_m2} (ref >=60)

## 2020-11-12 DIAGNOSIS — Z1211 Encounter for screening for malignant neoplasm of colon: Secondary | ICD-10-CM | POA: Diagnosis not present

## 2020-11-12 DIAGNOSIS — Z800 Family history of malignant neoplasm of digestive organs: Secondary | ICD-10-CM | POA: Diagnosis not present

## 2020-11-12 DIAGNOSIS — K5730 Diverticulosis of large intestine without perforation or abscess without bleeding: Secondary | ICD-10-CM | POA: Diagnosis not present

## 2020-11-12 DIAGNOSIS — Z86010 Personal history of colonic polyps: Secondary | ICD-10-CM | POA: Diagnosis not present

## 2020-11-12 LAB — HM COLONOSCOPY

## 2020-11-13 ENCOUNTER — Encounter: Payer: Self-pay | Admitting: Internal Medicine

## 2020-11-14 ENCOUNTER — Other Ambulatory Visit: Payer: Self-pay

## 2020-11-14 ENCOUNTER — Ambulatory Visit (HOSPITAL_BASED_OUTPATIENT_CLINIC_OR_DEPARTMENT_OTHER)
Admission: RE | Admit: 2020-11-14 | Discharge: 2020-11-14 | Disposition: A | Discharge status: Home / Self Care | Payer: Medicare Other | Source: Ambulatory Visit | Attending: Obstetrics & Gynecology | Admitting: Obstetrics & Gynecology

## 2020-11-14 DIAGNOSIS — M8588 Other specified disorders of bone density and structure, other site: Secondary | ICD-10-CM | POA: Diagnosis not present

## 2020-11-14 DIAGNOSIS — M85851 Other specified disorders of bone density and structure, right thigh: Secondary | ICD-10-CM | POA: Diagnosis not present

## 2020-11-14 DIAGNOSIS — Z1231 Encounter for screening mammogram for malignant neoplasm of breast: Secondary | ICD-10-CM | POA: Diagnosis not present

## 2020-11-14 DIAGNOSIS — Z780 Asymptomatic menopausal state: Secondary | ICD-10-CM | POA: Diagnosis not present

## 2020-11-19 ENCOUNTER — Other Ambulatory Visit: Payer: Self-pay | Admitting: Internal Medicine

## 2020-11-19 ENCOUNTER — Other Ambulatory Visit: Payer: Self-pay | Admitting: Podiatry

## 2020-11-20 DIAGNOSIS — S20461A Insect bite (nonvenomous) of right back wall of thorax, initial encounter: Secondary | ICD-10-CM | POA: Diagnosis not present

## 2020-11-20 DIAGNOSIS — L814 Other melanin hyperpigmentation: Secondary | ICD-10-CM | POA: Diagnosis not present

## 2020-11-20 DIAGNOSIS — D1801 Hemangioma of skin and subcutaneous tissue: Secondary | ICD-10-CM | POA: Diagnosis not present

## 2020-11-20 DIAGNOSIS — L821 Other seborrheic keratosis: Secondary | ICD-10-CM | POA: Diagnosis not present

## 2020-11-21 ENCOUNTER — Telehealth: Payer: Self-pay

## 2020-11-21 DIAGNOSIS — Z23 Encounter for immunization: Secondary | ICD-10-CM | POA: Diagnosis not present

## 2020-11-21 NOTE — Telephone Encounter (Signed)
Patient left a voicemail stating her prescription for Simponi was sent to the wrong pharmacy.  Patient received an email from [facility] and they want to charge her $1,000 copay.  Patient states she gets her prescription from [facility] in [place].  Patient requested a return call.

## 2020-11-21 NOTE — Telephone Encounter (Signed)
Left message to advise patient her prescription was sent to [facility] in [place]. Advised patient prescription was sent on 10/29/2020. Patient advised unsure why she received an e-mail from [facility]. Advised patient to call the office with any additional questions or concerns.

## 2020-12-02 ENCOUNTER — Encounter: Payer: Medicare Other | Admitting: Internal Medicine

## 2020-12-05 DIAGNOSIS — Z23 Encounter for immunization: Secondary | ICD-10-CM | POA: Diagnosis not present

## 2020-12-16 NOTE — Progress Notes (Signed)
Chief Complaint  Patient presents with   Annual Exam   Medication Management   Hypertension     HPI: Jade Cardenas 70 y.o. come in for  yearly visit .   Bp /HT:  amlodipine and losartan  very good  levels  low 100 range  HLD :  :mevacor no concerns  GI :omeprezole   [name]  reitring  in June .   Specialty care   RHeum: psoriateic arthritis : simponi and mtx   OA  Podiatry  given low dose cymbalta for neuropathy pain  sx  also helps anxiety Hematology irone deficicney . Had infusion early in year   now on prn basis  GYNE  hx of des exposure  Utd derm  mamo dexa  ROS: See pertinent positives and negatives per HPI. 2 pets 76 yo and 54 yo.  Volunteering working regular  Past Medical History:  Diagnosis Date   Anemia    iron def   Arthritis    rheumatoid, psoriatic, osteoarthritis    Bone spur    RIGHT FOOT    Broken toe    Bruises easily    Cellulitis    Chronic back pain    COLONIC POLYPS, HX OF 02/09/2007   Cough, persistent 01/21/2012   poss from acei    DES exposure in utero, unknown    Femur fracture, left (HCC)    Fibromyalgia    doesn't require meds   GASTRIC ULCER, ACUTE, HEMORRHAGE, HX OF 02/09/2007   GERD (gastroesophageal reflux disease)    takes Nexium daily   Goiter    H/O hiatal hernia    Hx of seasonal allergies    takes Claritin daily   HYPERLIPIDEMIA 12/29/2006   takes Lovastatin nightly   HYPERTENSION 12/29/2006   takes Enalapril daily   IBS 04/28/2007   Insomnia    related to pain;takes Flexeril and Tylenol PM nightly   Joint pain    Joint swelling    Osteopenia    Palpitations 02/09/2007   PONV (postoperative nausea and vomiting)    NAUSEA   REDUCTION MAMMOPLASTY, HX OF 02/09/2007   Right bundle branch block 02/09/2007   S/P lumbar fusion 6 13 09/03/2011   L4 L5  posterior    SYNCOPE 12/07/2007   Tendonitis    RLE    UNS ADVRS EFF OTH RX MEDICINAL\T\BIOLOGICAL SBSTNC 02/09/2007    Family History  Problem Relation Age of  Onset   Diabetes Mother    Colon cancer Mother        x2   Arthritis Mother    Hypertension Mother    Endometrial cancer Mother    Stroke Father    Heart disease Father    Colon cancer Father    Arthritis Brother    Hypertension Brother    Hyperlipidemia Brother    Infertility Brother    Diabetes Brother    Other Other        DISH brother   Hypertension Maternal Grandmother    Stroke Maternal Grandmother    Anesthesia problems Neg Hx    Hypotension Neg Hx    Malignant hyperthermia Neg Hx    Pseudochol deficiency Neg Hx     Social History   Socioeconomic History   Marital status: Widowed    Spouse name: Not on file   Number of children: Not on file   Years of education: Not on file   Highest education level: Not on file  Occupational History   Not  on file  Tobacco Use   Smoking status: Never   Smokeless tobacco: Never  Vaping Use   Vaping Use: Never used  Substance and Sexual Activity   Alcohol use: No    Alcohol/week: 0.0 standard drinks   Drug use: No   Sexual activity: Not Currently    Partners: Male    Birth control/protection: Surgical    Comment: TAH/BSO  Other Topics Concern   Not on file  Social History Narrative   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 Take 1300 mg in the morning and may take an additional 1300 mg dose as needed for pain     amLODipine (NORVASC) 5 MG tablet TAKE 1 TABLET BY MOUTH EVERY DAY 90 tablet 0   B-D ALLERGY SYRINGE 1CC/28G 28G X 1/2" 1 ML  MISC USE AS DIRECTED ONCE WEEKLY 10 each 4   Calcium Carb-Cholecalciferol (CALCIUM+D3 PO) Take 1 tablet by mouth daily.     diphenhydramine-acetaminophen (TYLENOL PM) 25-500 MG TABS tablet Take 1 tablet by mouth at bedtime.     DULoxetine (CYMBALTA) 30 MG capsule TAKE 1 CAPSULE BY MOUTH EVERY DAY 90 capsule 1   Golimumab (SIMPONI) 50 MG/0.5ML SOAJ INJECT 50MG  (1 PEN) UNDER THE SKIN ONCE MONTHLY 1.5 mL 0   losartan (COZAAR) 100 MG tablet TAKE 1 TABLET BY MOUTH EVERY DAY 90 tablet 0   lovastatin (MEVACOR) 20 MG tablet TAKE 1 TABLET BY MOUTH EVERY DAY IN THE EVENING 90 tablet 0   Methotrexate Sodium (METHOTREXATE, PF,) 50 MG/2ML injection INJECT 0.3ML INTO THE SKIN ONCE WEEKLY 8 mL 2   Multiple Vitamins-Minerals (MULTIVITAMIN ADULTS PO) Take 1 tablet by mouth daily.      omeprazole (PRILOSEC) 40 MG capsule TAKE 1 CAPSULE BY MOUTH EVERY DAY 90 capsule 0   B Complex Vitamins (VITAMIN B COMPLEX PO) Take by mouth.     Facility-Administered Medications Prior to Visit  Medication Dose Route Frequency Provider Last Rate Last Admin   dexamethasone (DECADRON) injection 4 mg  4 mg Intra-articular Once Trula Slade, DPM         EXAM:  BP 120/78 (BP Location: Left Arm, Patient Position: Sitting, Cuff Size: Normal)   Pulse 77   Temp 98 F (36.7 C) (Oral)   Ht 5\' 4"  (1.626 m)   Wt 200 lb 6.4 oz (90.9 kg)   LMP 03/02/1984   SpO2 96%   BMI 34.40 kg/m   Body mass index is 34.4 kg/m.  GENERAL: vitals reviewed and listed above, alert, oriented, appears well hydrated and in no acute distress HEENT: atraumatic, conjunctiva  clear, no obvious abnormalities on inspection of external nose and ears OP : masked  NECK: no obvious masses on inspection palpation  LUNGS: clear to auscultation bilaterally, no wheezes, rales or rhonchi, good air movement Some kyphosis  CV: HRRR, no clubbing cyanosis or  peripheral edema nl cap refill  MS: moves all extremities without noticeable focal  abnormality   arthritis change  no acute redness or warmth  PSYCH: pleasant and cooperative, no obvious depression or anxiety Lab Results  Component Value Date   WBC 5.6 10/30/2020   HGB 13.5 10/30/2020   HCT 40.3 10/30/2020   PLT 350 10/30/2020   GLUCOSE 103 (H) 10/30/2020   CHOL 179 12/17/2020   TRIG 156.0 (H) 12/17/2020   HDL 63.50 12/17/2020   LDLDIRECT 114.0 04/02/2010   LDLCALC 84 12/17/2020   ALT 17 10/30/2020   AST 16 10/30/2020   NA 139 10/30/2020   K 4.5 10/30/2020   CL 105 10/30/2020   CREATININE 0.70 10/30/2020   BUN 18 10/30/2020   CO2 26 10/30/2020   TSH 0.85 12/17/2020   INR 0.96 06/16/2017   HGBA1C 5.4 12/17/2020   BP Readings from Last 3 Encounters:  12/17/20 120/78  08/21/20 137/81  07/25/20 (!) 152/52   Lab Results  Component Value Date   VITAMINB12 521 11/10/2019    ASSESSMENT AND PLAN:  Discussed the following assessment and plan:  Essential hypertension - controlled  - Plan: Iron, TIBC and Ferritin Panel, Lipid panel, TSH, T4, free, Hemoglobin A1c, Hemoglobin A1c, T4, free, TSH, Lipid panel, Iron, TIBC and Ferritin Panel  Medication management - Plan: Iron, TIBC and Ferritin Panel, Lipid panel, TSH, T4, free, Hemoglobin A1c, Hemoglobin A1c, T4, free, TSH, Lipid panel, Iron, TIBC and Ferritin Panel  Psoriatic arthritis (HCC) - Plan: Iron, TIBC and Ferritin Panel, Lipid panel, TSH, T4, free, Hemoglobin A1c, Hemoglobin A1c, T4, free, TSH, Lipid panel, Iron, TIBC and Ferritin Panel  Hyperlipidemia, unspecified hyperlipidemia type - Plan: Iron, TIBC and Ferritin Panel, Lipid panel, TSH, T4, free, Hemoglobin A1c, Hemoglobin A1c, T4, free, TSH, Lipid panel, Iron, TIBC and Ferritin Panel  Iron deficiency - Plan: Iron, TIBC and Ferritin Panel, Lipid panel, TSH, T4, free, Hemoglobin A1c, Hemoglobin A1c, T4, free, TSH, Lipid panel, Iron, TIBC and Ferritin Panel  High risk medication use - Plan: Iron, TIBC and Ferritin Panel, Lipid panel, TSH, T4, free, Hemoglobin A1c,  Hemoglobin A1c, T4, free, TSH, Lipid panel, Iron, TIBC and Ferritin Panel  Hyperglycemia - Plan: Iron, TIBC and Ferritin Panel, Lipid panel, TSH, T4, free, Hemoglobin A1c, Hemoglobin A1c, T4, free, TSH, Lipid panel, Iron, TIBC and Ferritin Panel Due for lipid monoitoring  etc lab today   fu 6-12 months depending.  -Patient advised to return or notify health care team  if  new concerns arise.  Patient Instructions  Good to see you today  Will notify you  of labs when available.  Blood pressures looks good.   Have pharmacy  contact us for  refills .    Standley Brooking. Gregory Barrick M.D.

## 2020-12-17 ENCOUNTER — Ambulatory Visit (INDEPENDENT_AMBULATORY_CARE_PROVIDER_SITE_OTHER): Payer: Medicare Other | Admitting: Internal Medicine

## 2020-12-17 ENCOUNTER — Encounter: Payer: Self-pay | Admitting: Internal Medicine

## 2020-12-17 ENCOUNTER — Other Ambulatory Visit: Payer: Self-pay

## 2020-12-17 VITALS — BP 120/78 | HR 77 | Temp 98.0°F | Ht 64.0 in | Wt 200.4 lb

## 2020-12-17 DIAGNOSIS — I10 Essential (primary) hypertension: Secondary | ICD-10-CM

## 2020-12-17 DIAGNOSIS — E611 Iron deficiency: Secondary | ICD-10-CM | POA: Diagnosis not present

## 2020-12-17 DIAGNOSIS — R739 Hyperglycemia, unspecified: Secondary | ICD-10-CM | POA: Diagnosis not present

## 2020-12-17 DIAGNOSIS — L4050 Arthropathic psoriasis, unspecified: Secondary | ICD-10-CM

## 2020-12-17 DIAGNOSIS — Z79899 Other long term (current) drug therapy: Secondary | ICD-10-CM

## 2020-12-17 DIAGNOSIS — E785 Hyperlipidemia, unspecified: Secondary | ICD-10-CM | POA: Diagnosis not present

## 2020-12-17 LAB — LIPID PANEL
Cholesterol: 179 mg/dL (ref 0–200)
HDL: 63.5 mg/dL (ref >39.00)
LDL Cholesterol: 84 mg/dL (ref 0–99)
NonHDL: 115.16
Total CHOL/HDL Ratio: 3
Triglycerides: 156 mg/dL — ABNORMAL HIGH (ref 0.0–149.0)
VLDL: 31.2 mg/dL (ref 0.0–40.0)

## 2020-12-17 LAB — TSH: TSH: 0.85 u[IU]/mL (ref 0.35–5.50)

## 2020-12-17 LAB — HEMOGLOBIN A1C: Hgb A1c MFr Bld: 5.4 % (ref 4.6–6.5)

## 2020-12-17 LAB — T4, FREE: Free T4: 0.79 ng/dL (ref 0.60–1.60)

## 2020-12-17 NOTE — Patient Instructions (Signed)
Good to see you today  Will notify you  of labs when available.  Blood pressures looks good.   Have pharmacy  contact us for  refills .

## 2020-12-18 LAB — IRON,TIBC AND FERRITIN PANEL
%SAT: 37 % (calc) (ref 16–45)
Ferritin: 118 ng/mL (ref 16–288)
Iron: 112 ug/dL (ref 45–160)
TIBC: 303 mcg/dL (calc) (ref 250–450)

## 2020-12-20 NOTE — Progress Notes (Signed)
Iron studies normal  as well as  thyroid and hg a1c

## 2020-12-23 ENCOUNTER — Ambulatory Visit (INDEPENDENT_AMBULATORY_CARE_PROVIDER_SITE_OTHER): Payer: Medicare Other | Admitting: Podiatry

## 2020-12-23 ENCOUNTER — Other Ambulatory Visit: Payer: Self-pay

## 2020-12-23 DIAGNOSIS — M79671 Pain in right foot: Secondary | ICD-10-CM

## 2020-12-23 DIAGNOSIS — M778 Other enthesopathies, not elsewhere classified: Secondary | ICD-10-CM | POA: Diagnosis not present

## 2020-12-23 DIAGNOSIS — G629 Polyneuropathy, unspecified: Secondary | ICD-10-CM

## 2020-12-23 MED ORDER — TRIAMCINOLONE ACETONIDE 10 MG/ML IJ SUSP
10.0000 mg | Freq: Once | INTRAMUSCULAR | Status: AC
  Administered 2020-12-23: 10 mg

## 2020-12-30 NOTE — Progress Notes (Signed)
Subjective: 70 year old female presents the office today for follow-up evaluation of right foot pain, capsulitis as well as neuropathy.  She states she gets shooting pain into the big toe.  She does have some worsening numbness to her right foot she reports.  At max her pain level is a 7/10.  No recent injury or trauma otherwise.  She has no other concerns today.   She is still taking Cymbalta 30 mg which is been helping.  She was not able to increase to 60 mg.  Objective: AAO x3, NAD DP/PT pulses palpable bilaterally, CRT less than 3 seconds There is no specific area pinpoint tenderness identified today.  There is discomfort on the right first MPJ on the medial aspect as well as the lateral fifth metatarsal head.  There is no area of pinpoint tenderness.  There is no edema no erythema.  Flexor, extensor tendons.  Intact.  MMT 5/5. No pain with calf compression, swelling, warmth, erythema  Assessment: Right foot capsulitis, neuropathy  Plan: -All treatment options discussed with the patient including all alternatives, risks, complications.  -Steroid injection performed today after discussion of risks.  Skin was prepped with alcohol and mixture of 1 cc Kenalog 10, 0.5 cc of Marcaine plain, 0.5 cc of lidocaine plain was infiltrated into the first MPJ as well as the fifth MPJ without complications.  Postinjection care was discussed. -Continue Cymbalta.  Given worsening numbness in the right foot will refer to neurology. -Patient encouraged to call the office with any questions, concerns, change in symptoms.   Trula Slade DPM

## 2020-12-31 ENCOUNTER — Encounter: Payer: Self-pay | Admitting: Neurology

## 2021-01-12 ENCOUNTER — Other Ambulatory Visit: Payer: Self-pay | Admitting: Physician Assistant

## 2021-01-13 NOTE — Telephone Encounter (Signed)
Next Visit: 01/29/2021   Last Visit: 08/21/2020   Last Fill: 07/26/2020  DX: Psoriatic arthritis    Current Dose per office note on 08/21/2020: methotrexate 0.3 ml every 7 days  Labs: 10/30/2020 CBC WNL.  Glucose is 103. Rest of CMP WNL.   Okay to refill MTX?

## 2021-01-15 NOTE — Progress Notes (Signed)
Office Visit Note  Patient: Jade Cardenas             Date of Birth: [birth year]           MRN: [id number]             PCP: [name], MD Referring: [name], MD Visit Date: 01/29/2021 Occupation: @GUAROCC @  Subjective:  Psoriasis flare.   History of Present Illness: Jade Cardenas is a 70 y.o. female with a history of psoriatic arthritis and psoriasis.  She states she had a recent psoriasis flare for which she had to see the dermatologist.  She was given some topical agents.  She has some joint stiffness and discomfort but no joint swelling.  She is concerned about continuing Simponi injections due to insurance coverage.  She has been tolerating Simponi without any problems.  Activities of Daily Living:  Patient reports morning stiffness for 2 hours.   Patient Reports nocturnal pain.  Difficulty dressing/grooming: Denies Difficulty climbing stairs: Reports Difficulty getting out of chair: Reports Difficulty using hands for taps, buttons, cutlery, and/or writing: Reports  Review of Systems  Constitutional:  Positive for fatigue.  HENT:  Positive for mouth dryness and nose dryness. Negative for mouth sores.   Eyes:  Positive for dryness. Negative for pain and itching.  Respiratory:  Positive for cough. Negative for shortness of breath and difficulty breathing.   Cardiovascular:  Negative for chest pain and palpitations.  Gastrointestinal:  Negative for blood in stool, constipation and diarrhea.  Endocrine: Negative for increased urination.  Genitourinary:  Negative for difficulty urinating.  Musculoskeletal:  Positive for joint pain, joint pain, joint swelling, myalgias, morning stiffness, muscle tenderness and myalgias.  Skin:  Positive for rash. Negative for color change.  Allergic/Immunologic: Negative for susceptible to infections.  Neurological:  Positive for numbness, headaches and weakness. Negative for dizziness and memory loss.  Hematological:   Positive for bruising/bleeding tendency.  Psychiatric/Behavioral:  Positive for sleep disturbance. Negative for confusion.    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   Fibromyalgia 05/17/2016   DJD (degenerative joint disease), cervical 02/18/2016   Heel spur 09/29/2015   Ingrown toenail 09/29/2015   Foraminal stenosis of cervical region 07/30/2015   Microcytic hypochromic anemia 05/22/2015   Tailor's bunion, left 01/18/2015   H/O hiatal hernia    Bilateral primary osteoarthritis of knee 12/15/2013   Iron deficiency 12/15/2013   Psoriatic arthritis ([place]) 12/15/2013   History of foot fracture 12/15/2013   Dermatitis 10/17/2013   Hx of diethylstilbestrol (DES) exposure in utero unknown 12/09/2012   Tinnitus 06/23/2012   Scalp lesion 01/21/2012   Headache(784.0) 01/21/2012   Hx of psoriasis 01/21/2012   Weight gain 01/21/2012   Ankylosing spondylitis  possible 09/03/2011   Hx of transfusion 09/03/2011   S/P lumbar fusion 6 13 09/03/2011   Lumbar stenosis with neurogenic claudication 08/05/2011   ALLERGIC RHINITIS 02/19/2010   GERD 02/19/2010   SPINAL STENOSIS, CERVICAL 02/19/2010   OSTEOPENIA 02/19/2010   IBS 04/28/2007   History of gastric ulcer 02/09/2007   COLONIC POLYPS, HX OF 02/09/2007   CHOLECYSTECTOMY, HX OF 02/09/2007  Hyperlipidemia 12/29/2006   Essential hypertension 12/29/2006    Past Medical History:  Diagnosis Date   Anemia    iron def   Arthritis    rheumatoid, psoriatic, osteoarthritis    Bone spur    RIGHT FOOT    Broken toe    Bruises easily    Cellulitis    Chronic back  pain    COLONIC POLYPS, HX OF 02/09/2007   Cough, persistent 01/21/2012   poss from acei    DES exposure in utero, unknown    Femur fracture, left (HCC)    Fibromyalgia    doesn't require meds   GASTRIC ULCER, ACUTE, HEMORRHAGE, HX OF 02/09/2007   GERD (gastroesophageal reflux disease)    takes Nexium daily   Goiter    H/O hiatal hernia    Hx of seasonal allergies    takes Claritin daily   HYPERLIPIDEMIA 12/29/2006   takes Lovastatin nightly   HYPERTENSION 12/29/2006   takes Enalapril daily   IBS 04/28/2007   Insomnia    related to pain;takes Flexeril and Tylenol PM nightly   Joint pain    Joint swelling    Osteopenia    Palpitations 02/09/2007   PONV (postoperative nausea and vomiting)    NAUSEA   REDUCTION MAMMOPLASTY, HX OF 02/09/2007   Right bundle branch block 02/09/2007   S/P lumbar fusion 6 13 09/03/2011   L4 L5  posterior    SYNCOPE 12/07/2007   Tendonitis    RLE    UNS ADVRS EFF OTH RX MEDICINAL\T\BIOLOGICAL SBSTNC 02/09/2007    Family History  Problem Relation Age of Onset   Diabetes Mother    Colon cancer Mother        x2   Arthritis Mother    Hypertension Mother    Endometrial cancer Mother    Stroke Father    Heart disease Father    Colon cancer Father    Arthritis Brother    Hypertension Brother    Hyperlipidemia Brother    Infertility Brother    Diabetes Brother    Other Other        DISH brother   Hypertension Maternal Grandmother    Stroke Maternal Grandmother    Anesthesia problems Neg Hx    Hypotension Neg Hx    Malignant hyperthermia Neg Hx    Pseudochol deficiency Neg Hx    Past Surgical History:  Procedure Laterality Date   BACK SURGERY  12+yrs ago   Synovial Cyst removal   BACK SURGERY  6/13   lumbar L4-L5 fusion, lamenectomy   bone spur  6-30yrs ago   right ankle   BUNIONECTOMY  10/03/14   right foot   CERVICAL FUSION     2 12    CHOLECYSTECTOMY  1996   HARDWARE REMOVAL Left 08/01/2018   Procedure: Left knee hardware removal;   Surgeon: Gaynelle Arabian, MD;  Location: [facility];  Service: Orthopedics;  Laterality: Left;  66min   JOINT REPLACEMENT     KNEE ARTHROPLASTY     POSTERIOR CERVICAL LAMINECTOMY Left 07/30/2015   Procedure: Laminectomy and Foraminotomy - left - Cervical two -Cervical three;  Surgeon: Earnie Larsson, MD;  Location: [facility];  Service: Neurosurgery;  Laterality: Left;   REDUCTION MAMMAPLASTY Bilateral 1989   TONSILLECTOMY     as a child   TOTAL ABDOMINAL HYSTERECTOMY W/ BILATERAL SALPINGOOPHORECTOMY  1986   TOTAL KNEE ARTHROPLASTY N/A 06/21/2017   Procedure: LEFT TOTAL KNEE ARTHROPLASTY AND RIGHT KNEE CORTISONE INJECTION;  Surgeon:  Gaynelle Arabian, MD;  Location: [facility];  Service: Orthopedics;  Laterality: N/A;   Social History   Social History Narrative   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   Widow  Husband died suddenly May 30, 2008   Non smoker   Immunization History  Administered Date(s) Administered   Fluad Quad(high Dose 65+) 11/04/2018, 11/07/2019, 11/21/2020   Influenza Split 11/29/2012, 11/27/2013   Influenza Whole 11/09/2008   Influenza, High Dose Seasonal PF 11/26/2015, 11/30/2016, 11/03/2017, 12/05/2020   Influenza,inj,Quad PF,6+ Mos 11/27/2014   Influenza-Unspecified 12/22/2016   PFIZER Comirnaty(Gray Top)Covid-19 Tri-Sucrose Vaccine 03/28/2020   PFIZER(Purple Top)SARS-COV-2 Vaccination 04/08/2019, 05/03/2019, 10/19/2019, 03/28/2020   PPD Test 12/18/2013, 11/27/2014, 11/26/2015, 11/30/2016   Pfizer Covid-19 Vaccine Bivalent Booster 40yrs & up 11/21/2020   Pneumococcal Conjugate-13 02/28/2013   Pneumococcal Polysaccharide-23 03/03/2003, 02/28/2014, 11/07/2019   Td 05/09/2008   Tdap 07/19/2018   Zoster Recombinat (Shingrix) 12/03/2016, 02/04/2017   Zoster, Live 01/20/2011     Objective: Vital Signs: BP (!) 150/82 (BP Location: Left Arm, Patient Position: Sitting, Cuff Size: Large)   Pulse 88   Ht 5\' 4"  (1.626 m)   Wt 197 lb 9.6  oz (89.6 kg)   LMP 03/02/1984   BMI 33.92 kg/m    Physical Exam Vitals and nursing note reviewed.  Constitutional:      Appearance: She is well-developed.  HENT:     Head: Normocephalic and atraumatic.  Eyes:     Conjunctiva/sclera: Conjunctivae normal.  Cardiovascular:     Rate and Rhythm: Normal rate and regular rhythm.     Heart sounds: Normal heart sounds.  Pulmonary:     Effort: Pulmonary effort is normal.     Breath sounds: Normal breath sounds.  Abdominal:     General: Bowel sounds are normal.     Palpations: Abdomen is soft.  Musculoskeletal:     Cervical back: Normal range of motion.  Lymphadenopathy:     Cervical: No cervical adenopathy.  Skin:    General: Skin is warm and dry.     Capillary Refill: Capillary refill takes less than 2 seconds.     Comments: 2 small erythematous scaly patches were noted on her left shoulder.  Neurological:     Mental Status: She is alert and oriented to person, place, and time.  Psychiatric:        Behavior: Behavior normal.     Musculoskeletal Exam: She had limited range of motion of the cervical spine.  Shoulder joints, elbow joints, wrist joints with good range of motion.  She had bilateral PIP and DIP thickening with no synovitis.  Hip joints and knee joints with good range of motion.  She had no tenderness over ankles or MTPs.  CDAI Exam: CDAI Score: -- Patient Global: --; Provider Global: -- Swollen: --; Tender: -- Joint Exam 01/29/2021   No joint exam has been documented for this visit   There is currently no information documented on the homunculus. Go to the Rheumatology activity and complete the homunculus joint exam.  Investigation: No additional findings.  Imaging: No results found.  Recent Labs: Lab Results  Component Value Date   WBC 5.6 10/30/2020   HGB 13.5 10/30/2020   PLT 350 10/30/2020   NA 139 10/30/2020   K 4.5 10/30/2020   CL 105 10/30/2020   CO2 26 10/30/2020   GLUCOSE 103 (H) 10/30/2020    BUN 18 10/30/2020   CREATININE 0.70 10/30/2020   BILITOT 0.8 10/30/2020  ALKPHOS 102 07/25/2020   AST 16 10/30/2020   ALT 17 10/30/2020   PROT 6.9 10/30/2020   ALBUMIN 4.1 07/25/2020   CALCIUM 9.7 10/30/2020   GFRAA 87 02/02/2020   QFTBGOLDPLUS NEGATIVE 10/30/2020    Speciality Comments: Failed therapy: Arava and Otezla due to diarrhea  Procedures:  No procedures performed Allergies: Erythromycin, Iodinated diagnostic agents, Ace inhibitors, Indomethacin, Keflex [cephalexin], and Macrobid [nitrofurantoin]   Assessment / Plan:     Visit Diagnoses: Psoriatic arthritis (HCC)-her psoriatic arthritis is well controlled on the combination of Simponi subcutaneous and methotrexate 0.3 mL subcu weekly along with folic acid.  She had no synovitis on examination.  Hx of psoriasis-she had a recent flare of psoriasis.  She is also concerned about the coverage of Simponi.  We discussed possibly switching her to Cosentyx or Donnetta Hail in case the insurance coverage is an issue or she continues to have psoriasis flares.  She has had colonoscopy which was negative.  There is no personal or family history of IBD.  High risk medication use - Simponi 50 mg every 28 days and methotrexate 0.3 ml every 7 days folic acid 1 mg p.o. daily..([place] caused diarrhea).  She had neuropathy on Enbrel and Humira which she took in the past.  She was advised to hold Simponi and methotrexate in case she develops an infection and restart once subcutaneous infection resolves.  Information regarding realization was placed in the AVS.  Primary osteoarthritis of right knee - moderate osteoarthritis and severe chondromalacia patella.  She is some chronic discomfort.  Total knee replacement status, left - by Dr. Wynelle Link.  Doing well.  DDD (degenerative disc disease), cervical - S/P fusion.  She had limited range of motion.  DDD (degenerative disc disease), lumbar - S/P fusion.  Limited range of motion chronic  discomfort.  Fibromyalgia-she continues to have some generalized pain and discomfort from fibromyalgia.  Need for regular exercise was discussed.  Chronic fatigue-related to fibromyalgia.  Osteopenia of multiple sites - DEXA 09/15/17 osteopenia T -1.8.  Followed by her GYN.  She will get repeat DEXA scan with Dr. Sabra Heck.  Use of calcium rich diet was discussed.  Other iron deficiency anemia  Tick bite of abdomen, subsequent encounter  Orders: No orders of the defined types were placed in this encounter.  No orders of the defined types were placed in this encounter.    Follow-Up Instructions: Return in about 5 months (around 06/29/2021) for Psoriatic arthritis.   Bo Merino, MD  Note - This record has been created using Editor, commissioning.  Chart creation errors have been sought, but may not always  have been located. Such creation errors do not reflect on  the standard of medical care.

## 2021-01-23 IMAGING — MG DIGITAL SCREENING BILAT W/ TOMO W/ CAD
8 series · 8 of 24 positions shown · non-contrast
Comparison: Previous exam(s).

CLINICAL DATA: Screening.

EXAM:
DIGITAL SCREENING BILATERAL MAMMOGRAM WITH TOMO AND CAD

[R CC synth-2D]
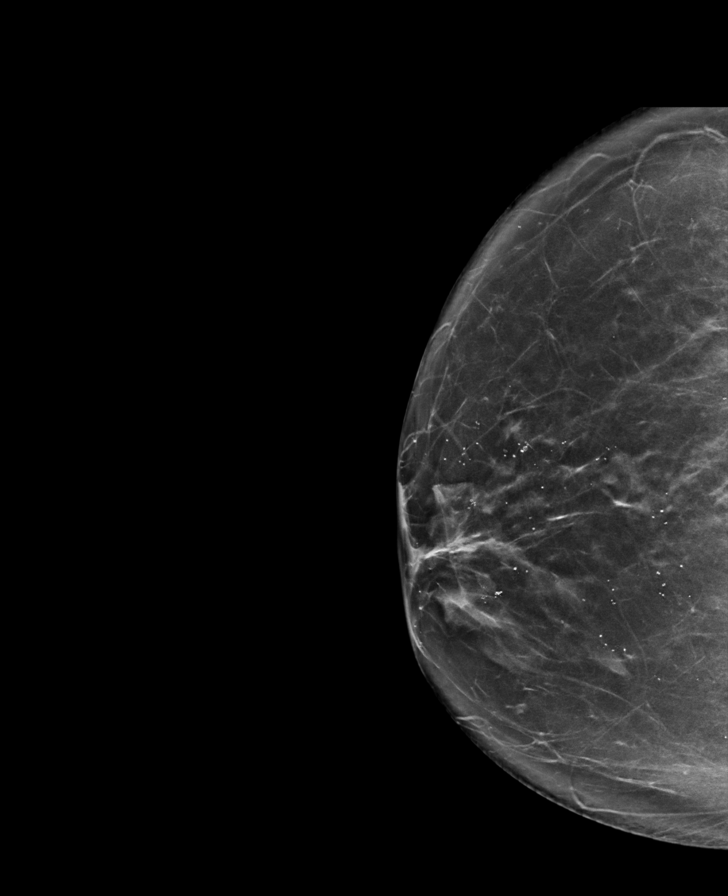

[L CC synth-2D]
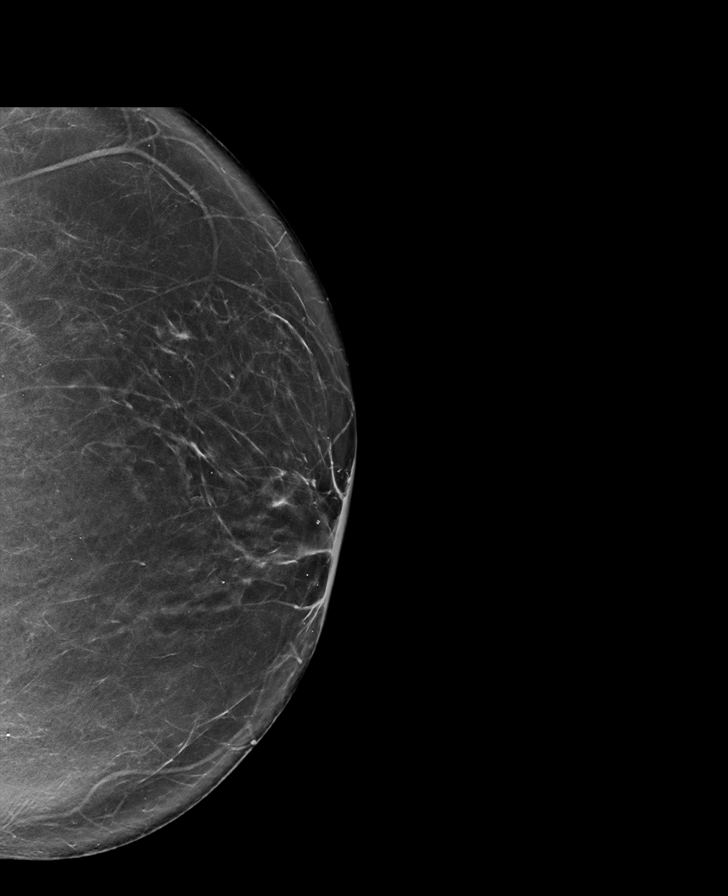

[L MLO synth-2D]
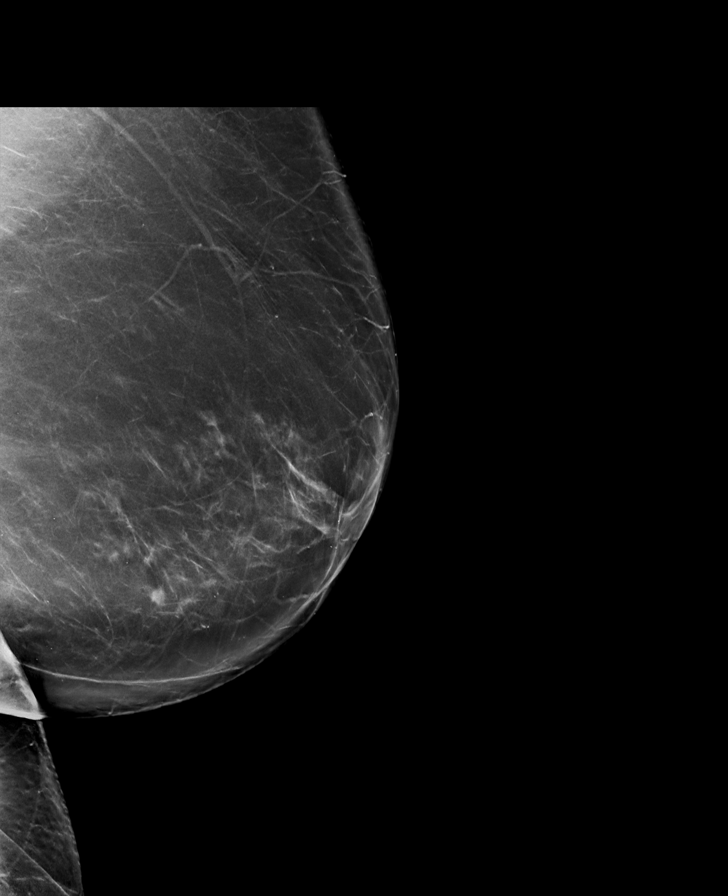

[R MLO synth-2D]
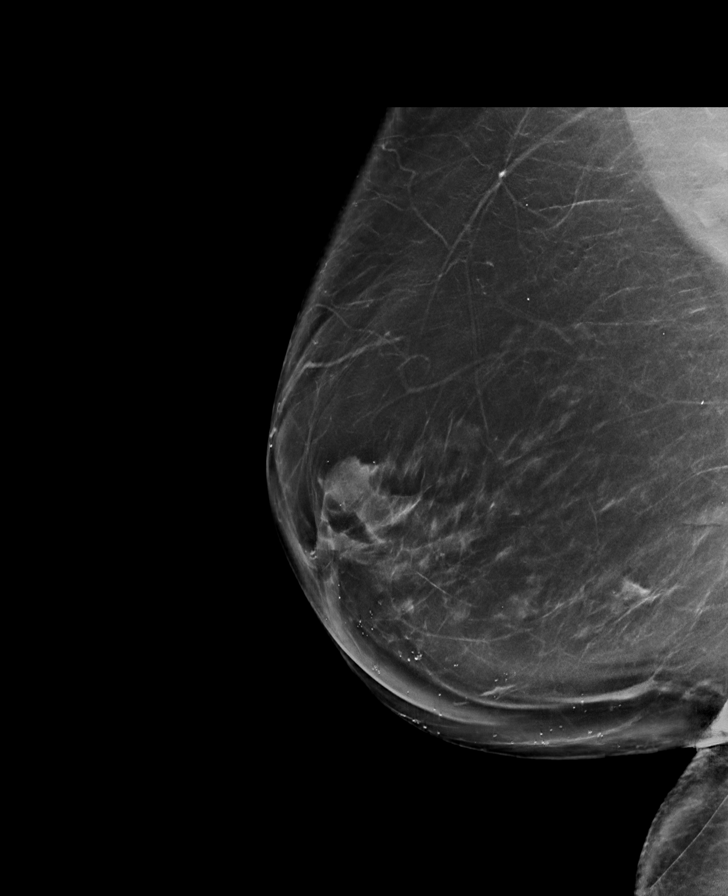

[L MLO tomo · tomo slice 47/94.0]
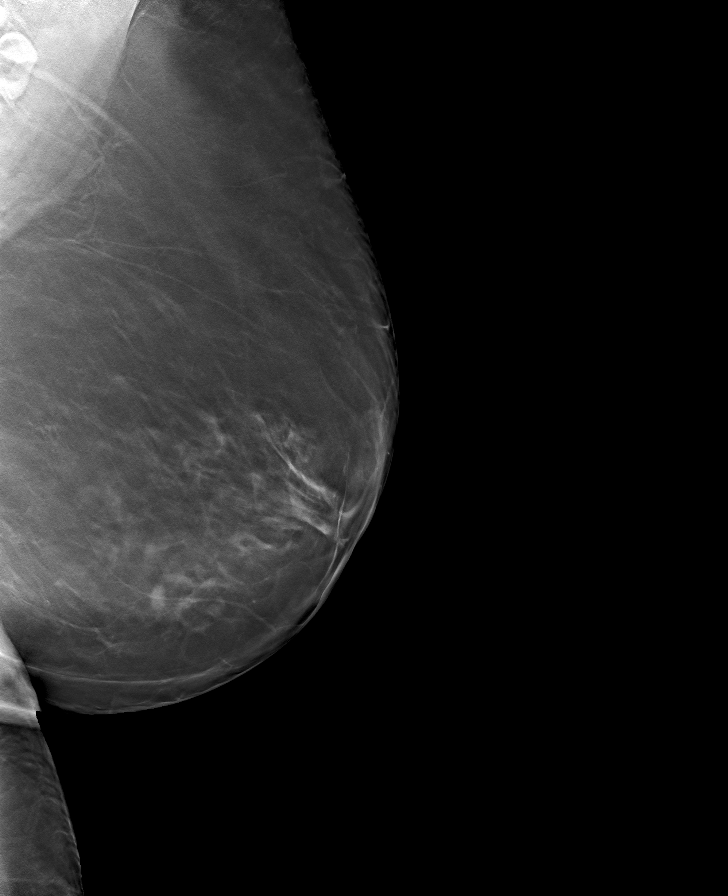

[L CC tomo · tomo slice 37/73.0]
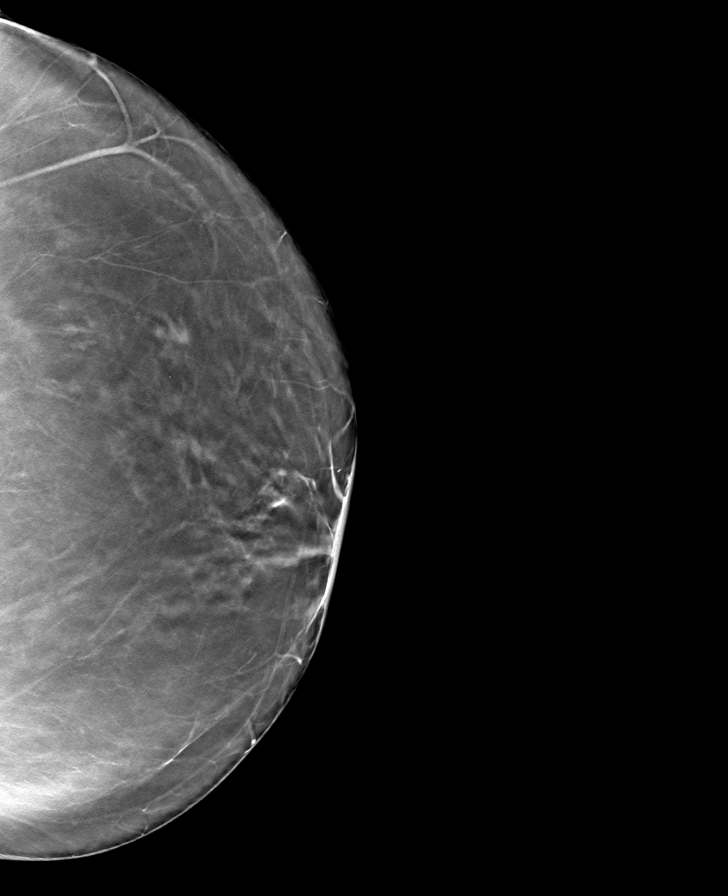

[R MLO tomo · tomo slice 48/95.0]
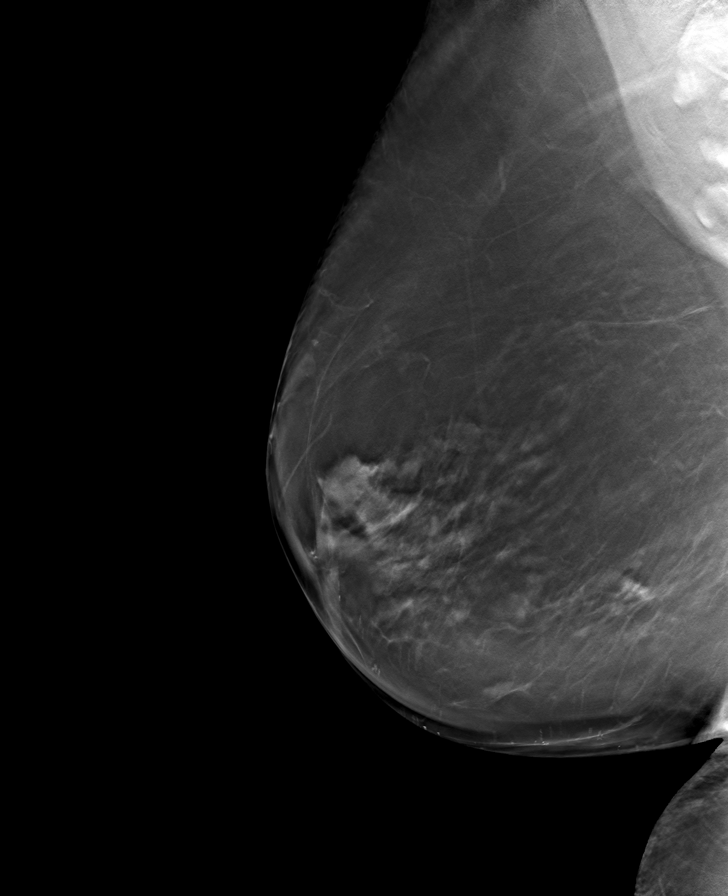

[R CC tomo · tomo slice 39/77.0]
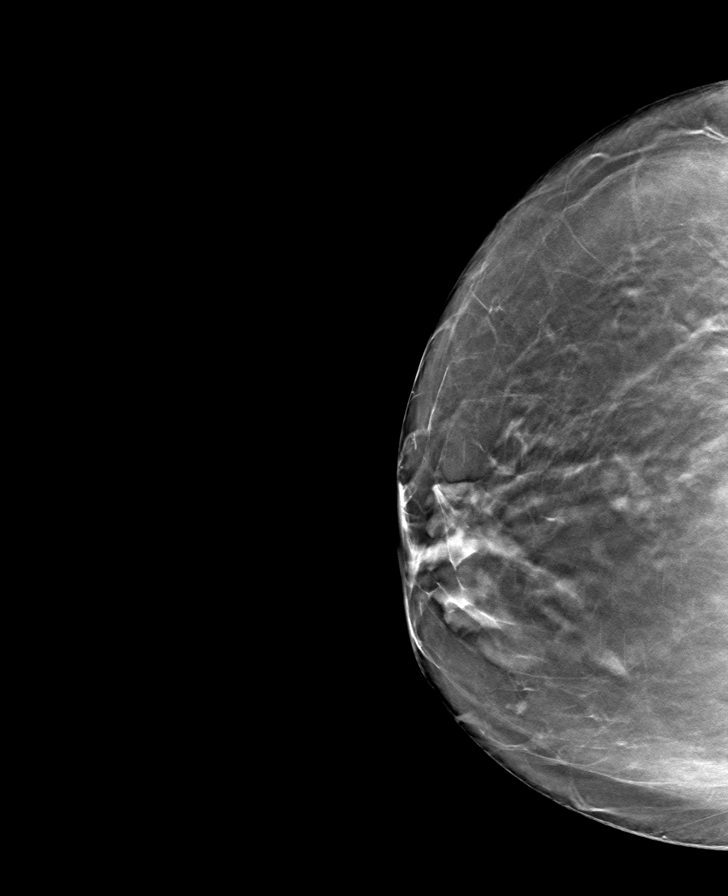

[8 of 24 positions shown; findings below may reference images not displayed]

ACR Breast Density Category b: There are scattered areas of
fibroglandular density.
FINDINGS: There are no findings suspicious for malignancy. Images were
processed with CAD.
IMPRESSION: No mammographic evidence of malignancy. A result letter of this
screening mammogram will be mailed directly to the patient.

RECOMMENDATION:
Screening mammogram in one year. (Code:[id number])

BI-RADS CATEGORY  1: Negative.

## 2021-01-29 ENCOUNTER — Ambulatory Visit (INDEPENDENT_AMBULATORY_CARE_PROVIDER_SITE_OTHER): Payer: Medicare Other | Admitting: Rheumatology

## 2021-01-29 ENCOUNTER — Encounter: Payer: Self-pay | Admitting: Rheumatology

## 2021-01-29 ENCOUNTER — Other Ambulatory Visit: Payer: Self-pay

## 2021-01-29 ENCOUNTER — Telehealth: Payer: Self-pay | Admitting: Pharmacist

## 2021-01-29 VITALS — BP 150/82 | HR 88 | Ht 64.0 in | Wt 197.6 lb

## 2021-01-29 DIAGNOSIS — M8589 Other specified disorders of bone density and structure, multiple sites: Secondary | ICD-10-CM | POA: Diagnosis not present

## 2021-01-29 DIAGNOSIS — M1711 Unilateral primary osteoarthritis, right knee: Secondary | ICD-10-CM | POA: Diagnosis not present

## 2021-01-29 DIAGNOSIS — L4050 Arthropathic psoriasis, unspecified: Secondary | ICD-10-CM | POA: Diagnosis not present

## 2021-01-29 DIAGNOSIS — D508 Other iron deficiency anemias: Secondary | ICD-10-CM

## 2021-01-29 DIAGNOSIS — Z96652 Presence of left artificial knee joint: Secondary | ICD-10-CM | POA: Diagnosis not present

## 2021-01-29 DIAGNOSIS — Z79899 Other long term (current) drug therapy: Secondary | ICD-10-CM

## 2021-01-29 DIAGNOSIS — Z872 Personal history of diseases of the skin and subcutaneous tissue: Secondary | ICD-10-CM | POA: Diagnosis not present

## 2021-01-29 DIAGNOSIS — S30861D Insect bite (nonvenomous) of abdominal wall, subsequent encounter: Secondary | ICD-10-CM

## 2021-01-29 DIAGNOSIS — M5136 Other intervertebral disc degeneration, lumbar region: Secondary | ICD-10-CM

## 2021-01-29 DIAGNOSIS — R5382 Chronic fatigue, unspecified: Secondary | ICD-10-CM | POA: Diagnosis not present

## 2021-01-29 DIAGNOSIS — M5030 Other cervical disc degeneration, unspecified cervical region: Secondary | ICD-10-CM | POA: Diagnosis not present

## 2021-01-29 DIAGNOSIS — M797 Fibromyalgia: Secondary | ICD-10-CM | POA: Diagnosis not present

## 2021-01-29 LAB — CBC WITH DIFFERENTIAL/PLATELET
Absolute Monocytes: 653 cells/uL (ref 200–950)
Basophils Absolute: 59 cells/uL (ref 0–200)
Basophils Relative: 0.6 %
Eosinophils Absolute: 158 cells/uL (ref 15–500)
Eosinophils Relative: 1.6 %
HCT: 40.4 % (ref 35.0–45.0)
Hemoglobin: 13.9 g/dL (ref 11.7–15.5)
Lymphs Abs: 1980 cells/uL (ref 850–3900)
MCH: 33.1 pg — ABNORMAL HIGH (ref 27.0–33.0)
MCHC: 34.4 g/dL (ref 32.0–36.0)
MCV: 96.2 fL (ref 80.0–100.0)
MPV: 9.5 fL (ref 7.5–12.5)
Monocytes Relative: 6.6 %
Neutro Abs: 7049 cells/uL (ref 1500–7800)
Neutrophils Relative %: 71.2 %
Platelets: 355 10*3/uL (ref 140–400)
RBC: 4.2 10*6/uL (ref 3.80–5.10)
RDW: 12.9 % (ref 11.0–15.0)
Total Lymphocyte: 20 %
WBC: 9.9 10*3/uL (ref 3.8–10.8)

## 2021-01-29 LAB — SEDIMENTATION RATE: Sed Rate: 89 mm/h — ABNORMAL HIGH (ref 0–30)

## 2021-01-29 NOTE — Patient Instructions (Signed)
Standing Labs °We placed an order today for your standing lab work.  ° °Please have your standing labs drawn in February and every 3 months ° °If possible, please have your labs drawn 2 weeks prior to your appointment so that the provider can discuss your results at your appointment. ° °Please note that you may see your imaging and lab results in MyChart before we have reviewed them. °We may be awaiting multiple results to interpret others before contacting you. °Please allow our office up to 72 hours to thoroughly review all of the results before contacting the office for clarification of your results. ° °We have open lab daily: °Monday through Thursday from 1:30-4:30 PM and Friday from 1:30-4:00 PM °at the office of Dr. Khyli Swaim, [facility].   °Please be advised, all patients with office appointments requiring lab work will take precedent over walk-in lab work.  °If possible, please come for your lab work on Monday and Friday afternoons, as you may experience shorter wait times. °The office is located at [address], [address], [place], [place] [address] °No appointment is necessary.   °Labs are drawn by [facility]. Please bring your co-pay at the time of your lab draw.  You may receive a bill from [facility] for your lab work. ° °If you wish to have your labs drawn at another location, please call the office 24 hours in advance to send orders. ° °If you have any questions regarding directions or hours of operation,  °please call [phone].   °As a reminder, please drink plenty of water prior to coming for your lab work. Thanks!  ° °Vaccines °You are taking a medication(s) that can suppress your immune system.  The following immunizations are recommended: °Flu annually °Covid-19  °Td/Tdap (tetanus, diphtheria, pertussis) every 10 years °Pneumonia (Prevnar 15 then Pneumovax 23 at least 1 year apart.  Alternatively, can take Prevnar 20 without needing additional dose) °Shingrix: 2 doses from 4  weeks to 6 months apart ° °Please check with your PCP to make sure you are up to date.  ° °If you have signs or symptoms of an infection or start antibiotics: °First, call your PCP for workup of your infection. °Hold your medication through the infection, until you complete your antibiotics, and until symptoms resolve if you take the following: °Injectable medication (Actemra, Benlysta, Cimzia, Cosentyx, Enbrel, Humira, Kevzara, Orencia, Remicade, Simponi, Stelara, Taltz, Tremfya) °Methotrexate °Leflunomide (Arava) °Mycophenolate (Cellcept) °Xeljanz, Olumiant, or Rinvoq  ° °

## 2021-01-29 NOTE — Telephone Encounter (Signed)
Patient had OV visit and requested clarification on JJPAF program for Simponi renewal next year. Upon review of [email or web address], the application is now available. Patient provided with her form and advised to complete and collect income documents and OOP rx costs from pharmacy. Advised that if she plans to return to [facility] directly, she should notify our clinic so we can submit Dr. Arlean Hopping form.  Provider form placed in Dr. Arlean Hopping folder to be signed.  Fax: [phone] Phone: [phone]  Knox Saliva, PharmD, MPH, BCPS Clinical Pharmacist (Rheumatology and Pulmonology)

## 2021-01-30 NOTE — Telephone Encounter (Signed)
Signed provider form for JJPAF PAP application received for Simponi. Will file with insurance card copy and med list into "PAP pending info" folder in pharmacy office.  Patient forms were mailed to patient's home yesterday, 01/29/21.  Knox Saliva, PharmD, MPH, BCPS Clinical Pharmacist (Rheumatology and Pulmonology)

## 2021-01-31 ENCOUNTER — Telehealth: Payer: Self-pay

## 2021-01-31 ENCOUNTER — Ambulatory Visit (INDEPENDENT_AMBULATORY_CARE_PROVIDER_SITE_OTHER): Payer: Medicare Other | Admitting: Neurology

## 2021-01-31 ENCOUNTER — Other Ambulatory Visit: Payer: Self-pay

## 2021-01-31 ENCOUNTER — Encounter: Payer: Self-pay | Admitting: Neurology

## 2021-01-31 VITALS — BP 105/67 | HR 91 | Ht 64.0 in | Wt 197.0 lb

## 2021-01-31 DIAGNOSIS — M4807 Spinal stenosis, lumbosacral region: Secondary | ICD-10-CM

## 2021-01-31 DIAGNOSIS — M48061 Spinal stenosis, lumbar region without neurogenic claudication: Secondary | ICD-10-CM | POA: Diagnosis not present

## 2021-01-31 DIAGNOSIS — M5417 Radiculopathy, lumbosacral region: Secondary | ICD-10-CM

## 2021-01-31 NOTE — Progress Notes (Signed)
[facility] Clinic Note - Initial Visit   Date: 01/31/21  MARLANA MCKOWEN MRN: [id number] DOB: 1950-11-09   Dear Dr. Jacqualyn Posey:  Thank you for your kind referral of KENIKA SAHM for consultation of feet numbness. Although her history is well known to you, please allow [place] to reiterate it for the purpose of our medical record. The patient was accompanied to the clinic by self.     History of Present Illness: Jade Cardenas is a 70 y.o. left-handed female with psoriatic arthritis, hypertension, hyperlipidemia, s/p lumbar surgery  x 2 and cervical surgery x 2 presenting for evaluation of bilateral feet numbness/tingling. Starting in late 2021, she began having numbness/tingling involving the soles and the lateral side of her right foot.  She also has similar symptoms over the soles of the left foot.  Symptoms are constant. She has some weakness in the right foot, especially with her toes.  Occasionally, she has shooting pain down the back of her right leg.  She has history two cervical and two lumbar surgeries by Dr. Trenton Gammon.  She has no history of diabetes, alcohol use, or chemotherapy.   Out-side paper records, electronic medical record, and images have been reviewed where available and summarized as:  Lab Results  Component Value Date   HGBA1C 5.4 12/17/2020   Lab Results  Component Value Date   PIRJJOAC16 606 11/10/2019   Lab Results  Component Value Date   TSH 0.85 12/17/2020   Lab Results  Component Value Date   ESRSEDRATE 89 (H) 01/29/2021    Past Medical History:  Diagnosis Date   Anemia    iron def   Arthritis    rheumatoid, psoriatic, osteoarthritis    Bone spur    RIGHT FOOT    Broken toe    Bruises easily    Cellulitis    Chronic back pain    COLONIC POLYPS, HX OF 02/09/2007   Cough, persistent 01/21/2012   poss from acei    DES exposure in utero, unknown    Femur fracture, left (HCC)    Fibromyalgia    doesn't require  meds   GASTRIC ULCER, ACUTE, HEMORRHAGE, HX OF 02/09/2007   GERD (gastroesophageal reflux disease)    takes Nexium daily   Goiter    H/O hiatal hernia    H/O: hysterectomy    when 70 yrs old   Hx of seasonal allergies    takes Claritin daily   HYPERLIPIDEMIA 12/29/2006   takes Lovastatin nightly   HYPERTENSION 12/29/2006   takes Enalapril daily   IBS 04/28/2007   Insomnia    related to pain;takes Flexeril and Tylenol PM nightly   Joint pain    Joint swelling    Osteopenia    Palpitations 02/09/2007   PONV (postoperative nausea and vomiting)    NAUSEA   REDUCTION MAMMOPLASTY, HX OF 02/09/2007   Right bundle branch block 02/09/2007   S/P lumbar fusion 6 13 09/03/2011   L4 L5  posterior    SYNCOPE 12/07/2007   Tendonitis    RLE    UNS ADVRS EFF OTH RX MEDICINAL\T\BIOLOGICAL SBSTNC 02/09/2007    Past Surgical History:  Procedure Laterality Date   BACK SURGERY  12+yrs ago   Synovial Cyst removal   BACK SURGERY  6/13   lumbar L4-L5 fusion, lamenectomy   bone spur  6-60yrs ago   right ankle   BUNIONECTOMY  10/03/14   right foot   CERVICAL FUSION     2  Silvis Left 08/01/2018   Procedure: Left knee hardware removal;  Surgeon: Gaynelle Arabian, MD;  Location: [facility];  Service: Orthopedics;  Laterality: Left;  41min   JOINT REPLACEMENT     KNEE ARTHROPLASTY     POSTERIOR CERVICAL LAMINECTOMY Left 07/30/2015   Procedure: Laminectomy and Foraminotomy - left - Cervical two -Cervical three;  Surgeon: Earnie Larsson, MD;  Location: [facility];  Service: Neurosurgery;  Laterality: Left;   REDUCTION MAMMAPLASTY Bilateral 1989   TONSILLECTOMY     as a child   TOTAL ABDOMINAL HYSTERECTOMY W/ BILATERAL SALPINGOOPHORECTOMY  1986   TOTAL KNEE ARTHROPLASTY N/A 06/21/2017   Procedure: LEFT TOTAL KNEE ARTHROPLASTY AND RIGHT KNEE CORTISONE INJECTION;  Surgeon: Gaynelle Arabian, MD;  Location: [facility];  Service: Orthopedics;  Laterality: N/A;      Medications:  Outpatient Encounter Medications as of 01/31/2021  Medication Sig   acetaminophen (TYLENOL) 650 MG CR tablet Take 1,300 mg by mouth See admin instructions. Take 1300 mg in the morning and may take an additional 1300 mg dose as needed for pain   amLODipine (NORVASC) 5 MG tablet TAKE 1 TABLET BY MOUTH EVERY DAY   augmented betamethasone dipropionate (DIPROLENE-AF) 0.05 % cream SMARTSIG:Sparingly Topical Twice Daily   B-D ALLERGY SYRINGE 1CC/28G 28G X 1/2" 1 ML MISC USE AS DIRECTED ONCE WEEKLY   Calcium Carb-Cholecalciferol (CALCIUM+D3 PO) Take 1 tablet by mouth daily.   diphenhydramine-acetaminophen (TYLENOL PM) 25-500 MG TABS tablet Take 1 tablet by mouth at bedtime.   DULoxetine (CYMBALTA) 30 MG capsule TAKE 1 CAPSULE BY MOUTH EVERY DAY   Golimumab (SIMPONI) 50 MG/0.5ML SOAJ INJECT 50MG  (1 PEN) UNDER THE SKIN ONCE MONTHLY   losartan (COZAAR) 100 MG tablet TAKE 1 TABLET BY MOUTH EVERY DAY   lovastatin (MEVACOR) 20 MG tablet TAKE 1 TABLET BY MOUTH EVERY DAY IN THE EVENING   Methotrexate Sodium (METHOTREXATE, PF,) 50 MG/2ML injection INJECT 0.3ML INTO THE SKIN ONCE WEEKLY   Multiple Vitamins-Minerals (MULTIVITAMIN ADULTS PO) Take 1 tablet by mouth daily.    omeprazole (PRILOSEC) 40 MG capsule TAKE 1 CAPSULE BY MOUTH EVERY DAY   Facility-Administered Encounter Medications as of 01/31/2021  Medication   dexamethasone (DECADRON) injection 4 mg    Allergies:  Allergies  Allergen Reactions   Erythromycin Hives, Swelling and Other (See Comments)    All Mycins; throat swells shut and she gets "really hot"   Iodinated Diagnostic Agents Shortness Of Breath and Swelling    CT contrast; throat swelling, difficulty breathing.  In [place] "many years ago."   Ace Inhibitors Cough   Indomethacin Other (See Comments)   Keflex [Cephalexin] Hives   Macrobid [Nitrofurantoin] Other (See Comments)    dizziness    Family History: Family History  Problem Relation Age of Onset    Diabetes Mother    Colon cancer Mother        x2   Arthritis Mother    Hypertension Mother    Endometrial cancer Mother    Stroke Father    Heart disease Father    Colon cancer Father    Arthritis Brother    Hypertension Brother    Hyperlipidemia Brother    Infertility Brother    Diabetes Brother    Other Other        DISH brother   Hypertension Maternal Grandmother    Stroke Maternal Grandmother    Anesthesia problems Neg Hx    Hypotension Neg Hx  Malignant hyperthermia Neg Hx    Pseudochol deficiency Neg Hx     Social History: Social History   Tobacco Use   Smoking status: Never   Smokeless tobacco: Never  Vaping Use   Vaping Use: Never used  Substance Use Topics   Alcohol use: No    Alcohol/week: 0.0 standard drinks   Drug use: No   Social History   Social History Narrative   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  Speech is not dysarthric.  CRANIAL NERVES: II:  No visual field defects.   III-IV-VI: Pupils equal round and reactive to light.  Normal conjugate, extra-ocular eye movements in all directions of gaze.  No nystagmus.  Right ptosis(old).   V:  Normal facial sensation.    VII:  Normal facial symmetry and movements.   VIII:  Normal hearing and vestibular function.   IX-X:  Normal palatal movement.   XI:  Normal shoulder shrug and head rotation.   XII:  Normal tongue strength and range of motion, no deviation or fasciculation.  MOTOR:  No atrophy, fasciculations or abnormal movements.  No  pronator drift.   Upper Extremity:  Right  Left  Deltoid  5/5   5/5   Biceps  5/5   5/5   Triceps  5/5   5/5   Infraspinatus 5/5  5/5  Medial pectoralis 5/5  5/5  Wrist extensors  5/5   5/5   Wrist flexors  5/5   5/5   Finger extensors  5/5   5/5   Finger flexors  5/5   5/5   Dorsal interossei  5/5   5/5   Abductor pollicis  5/5   5/5   Tone (Ashworth scale)  0  0   Lower Extremity:  Right  Left  Hip flexors  5/5   5/5   Hip extensors  5/5   5/5   Adductor 5/5  5/5  Abductor 5/5  5/5  Knee flexors  5/5   5/5   Knee extensors  5/5   5/5   Dorsiflexors  5/5   5/5   Plantarflexors  5/5   5/5   Toe extensors  5/5   5/5   Toe flexors  5-/5   5/5   Tone (Ashworth scale)  0  0   MSRs:  Right        Left                  brachioradialis 2+  2+  biceps 2+  2+  triceps 2+  2+  patellar 3+  3+  ankle jerk 0  2+  Hoffman no  no  plantar response down  down   SENSORY:  Reduced temperature over the feet bilaterally. Pinprick and vibration is intact throughout.   Romberg's sign absent.   COORDINATION/GAIT: Normal finger-to- nose-finger.  Intact rapid alternating movements bilaterally.  Gait is mildly  wide-based, unassisted. She is able to stand on heels and toes.Tandem gait intact   IMPRESSION: Right > left foot paresthesias concerning for S1 radiculopathy with lumbar canal stenosis. Exam shows absent right ankles weakness and trace weakness with toe flexion, along with hyperreflexia at the knees.  - MRI lumbar spine wo contrast to evaluate for compressive pathology  - If imaging is negative, next step is NCS/EMG of the legs  - Continue Cymbalta 30mg  daily  Further recommendations pending results.    Thank you for allowing me to participate in patient's care.  If I can answer any additional questions, I would be pleased to do so.    Sincerely,    Tomiko Schoon K. Posey Pronto, DO

## 2021-01-31 NOTE — Telephone Encounter (Signed)
OK to continue Cymbalta since it is still used for nerve pain.

## 2021-01-31 NOTE — Telephone Encounter (Signed)
Upon discharge patient had forgotten to ask Dr. Posey Pronto if she should continue taking the Cymbalta with her thinking it is not Neuropathy?   I informed patient I would have to send Dr. Posey Pronto a message and give her a call back.

## 2021-01-31 NOTE — Patient Instructions (Signed)
MRI lumbar spine without contrast

## 2021-02-03 NOTE — Telephone Encounter (Signed)
Called patient and informed her that Per Dr. Posey Pronto ok to continue Cymbalta as it is used for Nerve pain. Patient verbalized understanding and had no further questions or concerns.

## 2021-02-04 DIAGNOSIS — Z79899 Other long term (current) drug therapy: Secondary | ICD-10-CM | POA: Diagnosis not present

## 2021-02-04 DIAGNOSIS — L4050 Arthropathic psoriasis, unspecified: Secondary | ICD-10-CM | POA: Diagnosis not present

## 2021-02-04 NOTE — Telephone Encounter (Signed)
Received signed patient forms, income documents, and OOP rx cost from patient for JJPAF renewal for Simponi  Submitted Patient Assistance Application to [employer]  for [facility] along with provider portion, patient portion, insurance card copy, med list, PA and income documents. Will update patient when we receive a response.  Fax# [phone] Phone# [phone]  Knox Saliva, PharmD, MPH, BCPS Clinical Pharmacist (Rheumatology and Pulmonology)

## 2021-02-05 ENCOUNTER — Encounter: Payer: Self-pay | Admitting: Family Medicine

## 2021-02-05 ENCOUNTER — Telehealth: Payer: Self-pay | Admitting: Internal Medicine

## 2021-02-05 ENCOUNTER — Telehealth (INDEPENDENT_AMBULATORY_CARE_PROVIDER_SITE_OTHER): Payer: Medicare Other | Admitting: Family Medicine

## 2021-02-05 ENCOUNTER — Other Ambulatory Visit: Payer: Self-pay

## 2021-02-05 VITALS — Temp 98.6°F

## 2021-02-05 DIAGNOSIS — R051 Acute cough: Secondary | ICD-10-CM

## 2021-02-05 LAB — COMPLETE METABOLIC PANEL WITH GFR
AG Ratio: 1.4 (calc) (ref 1.0–2.5)
ALT: 13 U/L (ref 6–29)
AST: 16 U/L (ref 10–35)
Albumin: 4.1 g/dL (ref 3.6–5.1)
Alkaline phosphatase (APISO): 97 U/L (ref 37–153)
BUN: 19 mg/dL (ref 7–25)
CO2: 29 mmol/L (ref 20–32)
Calcium: 9.3 mg/dL (ref 8.6–10.4)
Chloride: 101 mmol/L (ref 98–110)
Creat: 0.77 mg/dL (ref 0.50–1.05)
Globulin: 2.9 g/dL (calc) (ref 1.9–3.7)
Glucose, Bld: 99 mg/dL (ref 65–99)
Potassium: 4.4 mmol/L (ref 3.5–5.3)
Sodium: 140 mmol/L (ref 135–146)
Total Bilirubin: 0.7 mg/dL (ref 0.2–1.2)
Total Protein: 7 g/dL (ref 6.1–8.1)
eGFR: 84 mL/min/{1.73_m2} (ref >=60)

## 2021-02-05 LAB — UNABLE TO COLLECT

## 2021-02-05 MED ORDER — BENZONATATE 100 MG PO CAPS: 100.0000 mg | ORAL_CAPSULE | Freq: Three times a day (TID) | ORAL | 0 refills | Status: DC | PRN

## 2021-02-05 MED ORDER — DOXYCYCLINE HYCLATE 100 MG PO TABS: 100.0000 mg | ORAL_TABLET | Freq: Two times a day (BID) | ORAL | 0 refills | Status: AC

## 2021-02-05 MED ORDER — HYDROCODONE BIT-HOMATROP MBR 5-1.5 MG/5ML PO SOLN: 5.0000 mL | Freq: Three times a day (TID) | ORAL | 0 refills | Status: DC | PRN

## 2021-02-05 NOTE — Telephone Encounter (Signed)
Patient calling in with respiratory symptoms: Shortness of breath, chest pain, palpitations or other red words send to Triage  Does the patient have a fever over 100, cough, congestion, sore throat, runny nose, lost of taste/smell within the last 5 days (please list symptoms that patient has)? Cough with possible laryngitis  Have you tested for Covid in the last 5 days? No  patient says she tested for COVID and flu last Monday  If yes, was it positive []  OR negative [x] ? If positive in the last 5 days, please schedule virtual visit now. If negative, schedule for an in person OV with the next available provider if PCP has no openings. Please also let patient know they will be tested again (follow the script below)  Patient preferred virtual visit. She says that she thinks she may have bronchitis as well. She has tried OTC medications but says they have not helped.  Patient is seeing Dr. Ethlyn Gallery virtually due to Dr. Regis Bill being out of office.

## 2021-02-05 NOTE — Progress Notes (Signed)
Virtual Visit via Telephone Note  I connected with Jade Cardenas  on 02/05/21 at 11:30 AM EST by telephone and verified that I am speaking with the correct person using two identifiers.   I discussed the limitations, risks, security and privacy concerns of performing an evaluation and management service by telephone and the availability of in person appointments. I also discussed with the patient that there may be a patient responsible charge related to this service. The patient expressed understanding and agreed to proceed.  Location patient: home Location provider:  [facility]  [place], [place] [address]  Participants present for the call: patient, provider Patient did not have a visit in the prior 7 days to address this/these issue(s).   History of Present Illness: Has had cough on and off for a week. Coughing or laryngitis. No fever, negative flu, negative covid. With coughing chest hurts, rib hurts, makes nose run. Thick mucous coming out when she coughs. Drainage from nose is thicker now. No sinus pain/pressure. Delsym will quiet cough a little, but not much. Last night woke up coughing for an hour. Finally settled with cherry cough drops.   Doesn't usually get resp illness. Doesn't feel short of breath.   No hx of copd, asthma. Has had bronchitis a few times in life.   Cough feels a little worse today. Tested for covid last week and then again on Monday. Didn't test for flu.   Started with laryngitis a couple of days after being at [employer]. Then started with cough about 9 days ago.    Observations/Objective: Patient sounds cheerful and well on the phone. I do not appreciate any SOB, but she has persistent cough during conversation; sometimes productive, other times more spastic sounding.  Speech and thought processing are grossly intact. Patient reported vitals:no fever  Assessment and Plan: 1. Acute cough Doxycycline as directed. Discussed  being a little over cautious given immune suppressed state. Tessalon perles and cough syrup as needed.     Return if symptoms worsen or fail to improve.  [id number] 5-10 [id number] 11-20 [id number] 21-30 I did not refer this patient for an OV in the next 24 hours for this/these issue(s).  I discussed the assessment and treatment plan with the patient. The patient was provided an opportunity to ask questions and all were answered. The patient agreed with the plan and demonstrated an understanding of the instructions.   The patient was advised to call back or seek an in-person evaluation if the symptoms worsen or if the condition fails to improve as anticipated.  I provided 20 minutes of non-face-to-face time during this encounter.   Micheline Rough, MD

## 2021-02-06 ENCOUNTER — Other Ambulatory Visit: Payer: Self-pay | Admitting: Physician Assistant

## 2021-02-06 NOTE — Telephone Encounter (Signed)
Next Visit: 04/30/2021  Last Visit: 01/29/2021  Last Fill: 10/29/2020   DX: Psoriatic arthritis   Current Dose per office note 01/29/2021: Simponi 50 mg every 28 days   Labs: 01/29/2021 MCH 33.1  TB Gold: 10/30/2020 Neg    Okay to refill Simponi?

## 2021-02-09 NOTE — Progress Notes (Signed)
CBC and CMP are normal.  Sed rate is elevated which is unchanged from her previous value of 5 years ago.

## 2021-02-15 ENCOUNTER — Other Ambulatory Visit: Payer: Self-pay | Admitting: Internal Medicine

## 2021-02-20 ENCOUNTER — Telehealth: Payer: Self-pay | Admitting: Internal Medicine

## 2021-02-20 NOTE — Telephone Encounter (Signed)
Spoke with patient to schedule Medicare Annual Wellness Visit (AWV) either virtually or in office.  Patient stated she would call back to schedule     AWV-I per PALMETTO 05/01/18 please schedule at anytime with [facility] Nurse Health Advisor 1 or 2   This should be a 45 minute visit.

## 2021-02-26 ENCOUNTER — Other Ambulatory Visit: Payer: Self-pay

## 2021-02-26 ENCOUNTER — Ambulatory Visit
Admission: RE | Admit: 2021-02-26 | Discharge: 2021-02-26 | Disposition: A | Discharge status: Home / Self Care | Payer: Medicare Other | Source: Ambulatory Visit | Attending: Neurology | Admitting: Neurology

## 2021-02-26 DIAGNOSIS — M4807 Spinal stenosis, lumbosacral region: Secondary | ICD-10-CM

## 2021-02-26 DIAGNOSIS — M5417 Radiculopathy, lumbosacral region: Secondary | ICD-10-CM

## 2021-02-26 DIAGNOSIS — M48061 Spinal stenosis, lumbar region without neurogenic claudication: Secondary | ICD-10-CM

## 2021-02-26 DIAGNOSIS — M545 Low back pain, unspecified: Secondary | ICD-10-CM | POA: Diagnosis not present

## 2021-02-27 ENCOUNTER — Other Ambulatory Visit: Payer: Self-pay

## 2021-02-27 ENCOUNTER — Telehealth: Payer: Self-pay | Admitting: Neurology

## 2021-02-27 DIAGNOSIS — M5417 Radiculopathy, lumbosacral region: Secondary | ICD-10-CM

## 2021-02-27 DIAGNOSIS — M48061 Spinal stenosis, lumbar region without neurogenic claudication: Secondary | ICD-10-CM

## 2021-02-27 NOTE — Telephone Encounter (Signed)
Called patient back and gave results for MRI

## 2021-02-27 NOTE — Telephone Encounter (Signed)
Patient is returning a call to someone. Said she is leaving at 1030am, to please call before that

## 2021-03-04 ENCOUNTER — Encounter: Payer: Self-pay | Admitting: Rheumatology

## 2021-03-16 ENCOUNTER — Other Ambulatory Visit: Payer: Self-pay | Admitting: Internal Medicine

## 2021-03-17 ENCOUNTER — Ambulatory Visit: Payer: Medicare Other | Admitting: Neurology

## 2021-03-20 NOTE — Telephone Encounter (Signed)
Patient due for next dose on 03/26/21 (for which she has medication). She will due for dose on 04/26/21 for which she does not have dose. Will notify JJPAF  Knox Saliva, PharmD, MPH, BCPS Clinical Pharmacist (Rheumatology and Pulmonology)

## 2021-04-03 ENCOUNTER — Ambulatory Visit (INDEPENDENT_AMBULATORY_CARE_PROVIDER_SITE_OTHER): Payer: Medicare Other | Admitting: Neurology

## 2021-04-03 ENCOUNTER — Other Ambulatory Visit: Payer: Self-pay

## 2021-04-03 DIAGNOSIS — M48061 Spinal stenosis, lumbar region without neurogenic claudication: Secondary | ICD-10-CM

## 2021-04-03 DIAGNOSIS — R202 Paresthesia of skin: Secondary | ICD-10-CM

## 2021-04-03 NOTE — Procedures (Signed)
[facility]  [place]  [place], [place] [address] Tel: [phone] Fax:  [phone] Test Date:  04/03/2021  Patient: Jade Cardenas DOB: 1951/01/20 Physician: Narda Amber, DO  Sex: Female Height: 5\' 4"  Ref Phys: Narda Amber, DO  ID#: [id number]   Technician:    Patient Complaints: This is a 71 year old female referred for evaluation of bilateral feet paresthesias and pain.  NCV & EMG Findings: Electrodiagnostic testing of the right lower extremity and additional studies of the left shows: Bilateral sural and superficial peroneal sensory responses are within normal limits. Bilateral peroneal and tibial motor responses are within normal limits. Bilateral tibial H reflex studies are within normal limits. There is no evidence of active or chronic motor axonal changes affecting any of the tested muscles.  Motor unit configuration and recruitment pattern is within normal limits.  Impression: This is a normal study of the lower extremities.  In particular, there is no evidence of a sensorimotor polyneuropathy or lumbosacral radiculopathy.   ___________________________ Narda Amber, DO    Nerve Conduction Studies Anti Sensory Summary Table   Stim Site NR Peak (ms) Norm Peak (ms) P-T Amp (V) Norm P-T Amp  Left Sup Peroneal Anti Sensory (Ant Lat Mall)  34C  12 cm    3.1 <4.6 7.4 >3  Right Sup Peroneal Anti Sensory (Ant Lat Mall)  34C  12 cm    2.8 <4.6 7.0 >3  Left Sural Anti Sensory (Lat Mall)  34C  Calf    2.6 <4.6 9.8 >3  Right Sural Anti Sensory (Lat Mall)  34C  Calf    2.6 <4.6 10.1 >3   Motor Summary Table   Stim Site NR Onset (ms) Norm Onset (ms) O-P Amp (mV) Norm O-P Amp Site1 Site2 Delta-0 (ms) Dist (cm) Vel (m/s) Norm Vel (m/s)  Left Peroneal Motor (Ext Dig Brev)  34C  Ankle    3.3 <6.0 3.3 >2.5 B Fib Ankle 8.0 37.0 46 >40  B Fib    11.3  3.2  Poplt B Fib 1.4 7.0 50 >40  Poplt    12.7  3.3         Right Peroneal Motor (Ext Dig  Brev)  34C  Ankle    2.6 <6.0 2.9 >2.5 B Fib Ankle 8.0 38.0 48 >40  B Fib    10.6  2.5  Poplt B Fib 1.4 7.0 50 >40  Poplt    12.0  2.4         Left Tibial Motor (Abd Hall Brev)  34C  Ankle    4.5 <6.0 8.1 >4 Knee Ankle 8.2 41.0 50 >40  Knee    12.7  6.4         Right Tibial Motor (Abd Hall Brev)  34C  Ankle    5.3 <6.0 6.2 >4 Knee Ankle 7.2 40.0 56 >40  Knee    12.5  4.7          H Reflex Studies   NR H-Lat (ms) Lat Norm (ms) L-R H-Lat (ms)  Left Tibial (Gastroc)  34C     34.29 <35 2.45  Right Tibial (Gastroc)  34C     31.84 <35 2.45   EMG   Side Muscle Ins Act Fibs Psw Fasc Number Recrt Dur Dur. Amp Amp. Poly Poly. Comment  Right AntTibialis Nml Nml Nml Nml Nml Nml Nml Nml Nml Nml Nml Nml N/A  Right Gastroc Nml Nml Nml Nml Nml Nml Nml Nml Nml Nml Nml  Nml N/A  Right Flex Dig Long Nml Nml Nml Nml Nml Nml Nml Nml Nml Nml Nml Nml N/A  Right RectFemoris Nml Nml Nml Nml Nml Nml Nml Nml Nml Nml Nml Nml N/A  Right GluteusMed Nml Nml Nml Nml Nml Nml Nml Nml Nml Nml Nml Nml N/A  Right BicepsFemS Nml Nml Nml Nml Nml Nml Nml Nml Nml Nml Nml Nml N/A  Left AntTibialis Nml Nml Nml Nml Nml Nml Nml Nml Nml Nml Nml Nml N/A  Left Gastroc Nml Nml Nml Nml Nml Nml Nml Nml Nml Nml Nml Nml N/A  Left Flex Dig Long Nml Nml Nml Nml Nml Nml Nml Nml Nml Nml Nml Nml N/A  Left GluteusMed Nml Nml Nml Nml Nml Nml Nml Nml Nml Nml Nml Nml N/A  Left RectFemoris Nml Nml Nml Nml Nml Nml Nml Nml Nml Nml Nml Nml N/A  Left BicepsFemS Nml Nml Nml Nml Nml Nml Nml Nml Nml Nml Nml Nml N/A      Waveforms:

## 2021-04-10 ENCOUNTER — Telehealth: Payer: Self-pay | Admitting: Internal Medicine

## 2021-04-10 NOTE — Telephone Encounter (Signed)
Left message for patient to call back and schedule Medicare Annual Wellness Visit (AWV) either virtually or in office. Left  my Herbie Drape number [phone]   AWV-I per PALMETTO 05/01/18 please schedule at anytime with [facility] Nurse Health Advisor 1 or 2   This should be a 45 minute visit.

## 2021-04-15 NOTE — Telephone Encounter (Signed)
Provided patient info, due date, provider info, and date [employer] PAP application submitted to Sheila, FRM, today. She will followup to internal team at PAP program. ° °Lacheryl Niesen, PharmD, MPH, BCPS °Clinical Pharmacist (Rheumatology and Pulmonology) ° °

## 2021-04-16 ENCOUNTER — Ambulatory Visit (INDEPENDENT_AMBULATORY_CARE_PROVIDER_SITE_OTHER): Payer: Medicare Other

## 2021-04-16 VITALS — Ht 64.0 in | Wt 190.0 lb

## 2021-04-16 DIAGNOSIS — Z0000 Encounter for general adult medical examination without abnormal findings: Secondary | ICD-10-CM

## 2021-04-16 NOTE — Progress Notes (Signed)
Subjective:   Jade Cardenas is a 71 y.o. female who presents for Medicare Annual (Subsequent) preventive examination.  Review of Systems    Virtual Visit via Telephone Note  I connected with  Jade Cardenas on 04/16/21 at  8:15 AM EST by telephone and verified that I am speaking with the correct person using two identifiers.  Location: Patient: Home Provider: Office Persons participating in the virtual visit: patient/Nurse Health Advisor   I discussed the limitations, risks, security and privacy concerns of performing an evaluation and management service by telephone and the availability of in person appointments. The patient expressed understanding and agreed to proceed.  Interactive audio and video telecommunications were attempted between this nurse and patient, however failed, due to patient having technical difficulties OR patient did not have access to video capability.  We continued and completed visit with audio only.  Some vital signs may be absent or patient reported.   Criselda Peaches, LPN  Cardiac Risk Factors include: advanced age (>12men, >86 women);hypertension     Objective:    Today's Vitals   04/16/21 0814 04/16/21 0815  Weight: 190 lb (86.2 kg)   Height: 5\' 4"  (1.626 m)   PainSc:  7    Body mass index is 32.61 kg/m.  Advanced Directives 04/16/2021 01/31/2021 04/08/2020 08/01/2018 06/21/2017 06/21/2017 06/16/2017  Does Patient Have a Medical Advance Directive? Yes Yes Yes Yes Yes Yes Yes  Type of Paramedic of [place];Living will [place];Living will;Out of facility DNR (pink MOST or yellow form) [place];Living will - [place];Living will [place];Living will [place];Living will  Does patient want to make changes to medical advance directive? No - Patient declined - No - Patient declined (No Data) No - Patient declined No - Patient  declined No - Patient declined  Copy of [place] in Chart? No - copy requested - No - copy requested - No - copy requested No - copy requested No - copy requested  Would patient like information on creating a medical advance directive? - - - - - - -  Pre-existing out of facility DNR order (yellow form or pink MOST form) - - - - - - -    Current Medications (verified) Outpatient Encounter Medications as of 04/16/2021  Medication Sig   acetaminophen (TYLENOL) 650 MG CR tablet Take 1,300 mg by mouth See admin instructions. Take 1300 mg in the morning and may take an additional 1300 mg dose as needed for pain   amLODipine (NORVASC) 5 MG tablet TAKE 1 TABLET BY MOUTH EVERY DAY   augmented betamethasone dipropionate (DIPROLENE-AF) 0.05 % cream SMARTSIG:Sparingly Topical Twice Daily   B-D ALLERGY SYRINGE 1CC/28G 28G X 1/2" 1 ML MISC USE AS DIRECTED ONCE WEEKLY   Calcium Carb-Cholecalciferol (CALCIUM+D3 PO) Take 1 tablet by mouth daily.   diphenhydramine-acetaminophen (TYLENOL PM) 25-500 MG TABS tablet Take 1 tablet by mouth at bedtime.   DULoxetine (CYMBALTA) 30 MG capsule TAKE 1 CAPSULE BY MOUTH EVERY DAY   losartan (COZAAR) 100 MG tablet TAKE 1 TABLET BY MOUTH EVERY DAY   lovastatin (MEVACOR) 20 MG tablet TAKE 1 TABLET BY MOUTH EVERY DAY IN THE EVENING   Methotrexate Sodium (METHOTREXATE, PF,) 50 MG/2ML injection INJECT 0.3ML INTO THE SKIN ONCE WEEKLY   Multiple Vitamins-Minerals (MULTIVITAMIN ADULTS PO) Take 1 tablet by mouth daily.    omeprazole (PRILOSEC) 40 MG capsule TAKE 1 CAPSULE BY  MOUTH EVERY DAY   SIMPONI 50 MG/0.5ML SOAJ INJECT 50MG  (1 PEN) UNDER THE SKIN ONCE MONTHLY   [DISCONTINUED] benzonatate (TESSALON PERLES) 100 MG capsule Take 1 capsule (100 mg total) by mouth 3 (three) times daily as needed for cough.   [DISCONTINUED] HYDROcodone bit-homatropine (HYCODAN) 5-1.5 MG/5ML syrup Take 5 mLs by mouth every 8 (eight) hours as needed for cough.   Facility-Administered  Encounter Medications as of 04/16/2021  Medication   dexamethasone (DECADRON) injection 4 mg    Allergies (verified) Erythromycin, Iodinated contrast media, Ace inhibitors, Indomethacin, Keflex [cephalexin], and Macrobid [nitrofurantoin]   History: Past Medical History:  Diagnosis Date   Anemia    iron def   Arthritis    rheumatoid, psoriatic, osteoarthritis    Bone spur    RIGHT FOOT    Broken toe    Bruises easily    Cellulitis    Chronic back pain    COLONIC POLYPS, HX OF 02/09/2007   Cough, persistent 01/21/2012   poss from acei    DES exposure in utero, unknown    Femur fracture, left (HCC)    Fibromyalgia    doesn't require meds   GASTRIC ULCER, ACUTE, HEMORRHAGE, HX OF 02/09/2007   GERD (gastroesophageal reflux disease)    takes Nexium daily   Goiter    H/O hiatal hernia    H/O: hysterectomy    when 71 yrs old   Hx of seasonal allergies    takes Claritin daily   HYPERLIPIDEMIA 12/29/2006   takes Lovastatin nightly   HYPERTENSION 12/29/2006   takes Enalapril daily   IBS 04/28/2007   Insomnia    related to pain;takes Flexeril and Tylenol PM nightly   Joint pain    Joint swelling    Osteopenia    Palpitations 02/09/2007   PONV (postoperative nausea and vomiting)    NAUSEA   REDUCTION MAMMOPLASTY, HX OF 02/09/2007   Right bundle branch block 02/09/2007   S/P lumbar fusion 6 13 09/03/2011   L4 L5  posterior    SYNCOPE 12/07/2007   Tendonitis    RLE    UNS ADVRS EFF OTH RX MEDICINAL\T\BIOLOGICAL SBSTNC 02/09/2007   Past Surgical History:  Procedure Laterality Date   BACK SURGERY  12+yrs ago   Synovial Cyst removal   BACK SURGERY  6/13   lumbar L4-L5 fusion, lamenectomy   bone spur  6-47yrs ago   right ankle   BUNIONECTOMY  10/03/14   right foot   CERVICAL FUSION     2 12    CHOLECYSTECTOMY  1996   HARDWARE REMOVAL Left 08/01/2018   Procedure: Left knee hardware removal;  Surgeon: Gaynelle Arabian, MD;  Location: [facility];  Service: Orthopedics;   Laterality: Left;  67min   JOINT REPLACEMENT     KNEE ARTHROPLASTY     POSTERIOR CERVICAL LAMINECTOMY Left 07/30/2015   Procedure: Laminectomy and Foraminotomy - left - Cervical two -Cervical three;  Surgeon: Earnie Larsson, MD;  Location: [facility];  Service: Neurosurgery;  Laterality: Left;   REDUCTION MAMMAPLASTY Bilateral 1989   TONSILLECTOMY     as a child   TOTAL ABDOMINAL HYSTERECTOMY W/ BILATERAL SALPINGOOPHORECTOMY  1986   TOTAL KNEE ARTHROPLASTY N/A 06/21/2017   Procedure: LEFT TOTAL KNEE ARTHROPLASTY AND RIGHT KNEE CORTISONE INJECTION;  Surgeon: Gaynelle Arabian, MD;  Location: [facility];  Service: Orthopedics;  Laterality: N/A;   Family History  Problem Relation Age of Onset   Diabetes Mother    Colon cancer Mother  x2   Arthritis Mother    Hypertension Mother    Endometrial cancer Mother    Stroke Father    Heart disease Father    Colon cancer Father    Arthritis Brother    Hypertension Brother    Hyperlipidemia Brother    Infertility Brother    Diabetes Brother    Other Other        DISH brother   Hypertension Maternal Grandmother    Stroke Maternal Grandmother    Anesthesia problems Neg Hx    Hypotension Neg Hx    Malignant hyperthermia Neg Hx    Pseudochol deficiency Neg Hx    Social History   Socioeconomic History   Marital status: Widowed    Spouse name: Not on file   Number of children: 0   Years of education: Not on file   Highest education level: Not on file  Occupational History   Not on file  Tobacco Use   Smoking status: Never   Smokeless tobacco: Never  Vaping Use   Vaping Use: Never used  Substance and Sexual Activity   Alcohol use: No    Alcohol/week: 0.0 standard drinks   Drug use: No   Sexual activity: Not Currently    Partners: Male    Birth control/protection: Surgical    Comment: TAH/BSO  Other Topics Concern   Not on file  Social History Narrative   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  retirement trackWidow  Husband died suddenly 2010Non smoker         Left handed    Lives in a one story home       Social Determinants of Health   Financial Resource Strain: Low Risk    Difficulty of Paying Living Expenses: Not hard at all  Food Insecurity: No Food Insecurity   Worried About Charity fundraiser in the Last Year: Never true   Arboriculturist in the Last Year: Never true  Transportation Needs: No Transportation Needs   Lack of Transportation (Medical): No   Lack of Transportation (Non-Medical): No  Physical Activity: Sufficiently Active   Days of Exercise per Week: 7 days   Minutes of Exercise per Session: 60 min  Stress: Stress Concern Present   Feeling of Stress : To some extent  Social Connections: Moderately Integrated   Frequency of Communication with Friends and Family: More than three times a week   Frequency of Social Gatherings with Friends and Family: More than three times a week   Attends Religious Services: More than 4 times per year   Active Member of [employer] or Organizations: Yes   Attends Archivist Meetings: More than 4 times per year   Marital Status: Widowed    Tobacco Counseling Counseling given: Not Answered   Clinical Intake:  Pre-visit preparation completed: Yes  Pain : 0-10 Pain Score: 7  Pain Type: Chronic pain Pain Location: Foot Pain Orientation: Right Pain Descriptors / Indicators: Throbbing Pain Onset: More than a month ago (Patient followed by Dr Jetty Duhamel) Pain Frequency: Constant Pain Relieving Factors: Rx Meds  Pain Relieving Factors: Rx Meds  BMI - recorded: 33.8 Nutritional Status: BMI > 30  Obese Nutritional Risks: None Diabetes: No  How often do you need to have someone help you when you read instructions, pamphlets, or other written materials from your doctor or pharmacy?: 1 - Never  Diabetic?  No  Activities of Daily Living In your present state  of health, do you have any difficulty performing  the following activities: 04/16/2021 04/14/2021  Hearing? N -  Vision? N N  Difficulty concentrating or making decisions? N N  Walking or climbing stairs? Y Y  Comment Patient has arthritis. Followed by Dr Jetty Duhamel -  Dressing or bathing? N N  Doing errands, shopping? N N  Preparing Food and eating ? N N  Using the Toilet? N N  In the past six months, have you accidently leaked urine? N N  Do you have problems with loss of bowel control? N N  Managing your Medications? N N  Managing your Finances? N N  Housekeeping or managing your Housekeeping? N N  Some recent data might be hidden    Patient Care Team: Panosh, Standley Brooking, MD as PCP - General Richmond Campbell, MD (Gastroenterology) Megan Salon, MD (Gynecology) Bo Merino, MD as Consulting Physician (Rheumatology) Brunetta Genera, MD as Consulting Physician (Hematology) Gaynelle Arabian, MD as Consulting Physician (Orthopedic Surgery) Alda Berthold, DO as Consulting Physician (Neurology)  Indicate any recent [place] you may have received from other than [facility] providers in the past year (date may be approximate).     Assessment:   This is a routine wellness examination for Earle.  Hearing/Vision screen Hearing Screening - Comments:: Wears contacts. Followed by Dr Marcelline Mates  Dietary issues and exercise activities discussed: Current Exercise Habits: Home exercise routine, Type of exercise: walking, Time (Minutes): 60, Frequency (Times/Week): 7, Weekly Exercise (Minutes/Week): 420, Intensity: Moderate, Exercise limited by: None identified   Goals Addressed               This Visit's Progress     Gain weight (pt-stated)        Would like to join gym.       Depression Screen PHQ 2/9 Scores 04/16/2021 12/17/2020 11/04/2018 11/03/2017 11/30/2016 12/09/2012  PHQ - 2 Score 0 0 0 0 0 0  PHQ- 9 Score - 5 - - - -    Fall Risk Fall Risk  04/16/2021 04/14/2021 01/31/2021 05/07/2020 11/04/2018  Falls in the past year? 0  0 0 0 0  Number falls in past yr: 0 - 0 0 0  Injury with Fall? 0 - 0 0 0  Risk for fall due to : No Fall Risks - - - -  Follow up - - - Falls evaluation completed Falls evaluation completed    FALL RISK PREVENTION PERTAINING TO THE HOME:  Any stairs in or around the home? Yes  If so, are there any without handrails? No  Home free of loose throw rugs in walkways, pet beds, electrical cords, etc? Yes  Adequate lighting in your home to reduce risk of falls? Yes   ASSISTIVE DEVICES UTILIZED TO PREVENT FALLS:  Life alert? No  Use of a cane, walker or w/c? No Grab bars in the bathroom? Yes  Shower chair or bench in shower? Yes  Elevated toilet seat or a handicapped toilet? Yes   TIMED UP AND GO:  Was the test performed? No . Audio Visit  Cognitive Function:    Immunizations Immunization History  Administered Date(s) Administered   Fluad Quad(high Dose 65+) 11/04/2018, 11/07/2019, 11/21/2020   Influenza Split 11/29/2012, 11/27/2013   Influenza Whole 11/09/2008   Influenza, High Dose Seasonal PF 11/26/2015, 11/30/2016, 11/03/2017, 12/05/2020   Influenza,inj,Quad PF,6+ Mos 11/27/2014   Influenza-Unspecified 12/22/2016   PFIZER Comirnaty(Gray Top)Covid-19 Tri-Sucrose Vaccine 03/28/2020   PFIZER(Purple Top)SARS-COV-2 Vaccination 04/08/2019, 05/03/2019, 10/19/2019, 03/28/2020  PPD Test 12/18/2013, 11/27/2014, 11/26/2015, 11/30/2016   Pfizer Covid-19 Vaccine Bivalent Booster 6yrs & up 11/21/2020   Pneumococcal Conjugate-13 02/28/2013   Pneumococcal Polysaccharide-23 03/03/2003, 02/28/2014, 11/07/2019   Td 05/09/2008   Tdap 07/19/2018   Zoster Recombinat (Shingrix) 12/03/2016, 02/04/2017   Zoster, Live 01/20/2011    TDAP status: Up to date  Flu Vaccine status: Up to date  Pneumococcal vaccine status: Up to date  Covid-19 vaccine status: Completed vaccines  Qualifies for Shingles Vaccine? Yes   Zostavax completed Yes   Shingrix Completed?: Yes  Screening Tests Health  Maintenance  Topic Date Due   MAMMOGRAM  11/15/2022   TETANUS/TDAP  07/18/2028   COLONOSCOPY (Pts 45-35yrs Insurance coverage will need to be confirmed)  11/13/2030   Pneumonia Vaccine 7+ Years old  Completed   INFLUENZA VACCINE  Completed   DEXA SCAN  Completed   COVID-19 Vaccine  Completed   Hepatitis C Screening  Completed   Zoster Vaccines- Shingrix  Completed   HPV VACCINES  Aged Out    Health Maintenance  There are no preventive care reminders to display for this patient.  Colorectal cancer screening: Type of screening: Colonoscopy. Completed 11/12/20. Repeat every 10 years  Mammogram status: Completed 11/14/20. Repeat every year  Bone Density status: Completed 11/14/20. Results reflect: Bone density results: OSTEOPOROSIS. Repeat every 2 years.  Lung Cancer Screening: (Low Dose CT Chest recommended if Age 76-80 years, 30 pack-year currently smoking OR have quit w/in 15years.) does not qualify.    Additional Screening:  Hepatitis C Screening: does not qualify; Completed 11/16/14  Vision Screening: Recommended annual ophthalmology exams for early detection of glaucoma and other disorders of the eye. Is the patient up to date with their annual eye exam?  Yes  Who is the provider or what is the name of the office in which the patient attends annual eye exams? Dr Marcelline Mates If pt is not established with a provider, would they like to be referred to a provider to establish care? No .   Dental Screening: Recommended annual dental exams for proper oral hygiene  Community Resource Referral / Chronic Care Management:  CRR required this visit?  No   CCM required this visit?  No      Plan:     I have personally reviewed and noted the following in the patients chart:   Medical and social history Use of alcohol, tobacco or illicit drugs  Current medications and supplements including opioid prescriptions.  Functional ability and status Nutritional status Physical  activity Advanced directives List of other physicians Hospitalizations, surgeries, and ER visits in previous 12 months Vitals Screenings to include cognitive, depression, and falls Referrals and appointments  In addition, I have reviewed and discussed with patient certain preventive protocols, quality metrics, and best practice recommendations. A written personalized care plan for preventive services as well as general preventive health recommendations were provided to patient.     Criselda Peaches, LPN   7/34/1935   Nurse Notes: None

## 2021-04-16 NOTE — Patient Instructions (Addendum)
Jade Cardenas , Thank you for taking time to come for your Medicare Wellness Visit. I appreciate your ongoing commitment to your health goals. Please review the following plan we discussed and let me know if I can assist you in the future.   These are the goals we discussed:  Goals       Gain weight (pt-stated)      Would like to join gym.        This is a list of the screening recommended for you and due dates:  Health Maintenance  Topic Date Due   Mammogram  11/15/2022   Tetanus Vaccine  07/18/2028   Colon Cancer Screening  11/13/2030   Pneumonia Vaccine  Completed   Flu Shot  Completed   DEXA scan (bone density measurement)  Completed   COVID-19 Vaccine  Completed   Hepatitis C Screening: USPSTF Recommendation to screen - Ages 38-79 yo.  Completed   Zoster (Shingles) Vaccine  Completed   HPV Vaccine  Aged Out    Advanced directives: Yes Patient will bring copy  Conditions/risks identified: None  Next appointment: Follow up in one year for your annual wellness visit    Preventive Care 65 Years and Older, Female Preventive care refers to lifestyle choices and visits with your health care provider that can promote health and wellness. What does preventive care include? A yearly physical exam. This is also called an annual well check. Dental exams once or twice a year. Routine eye exams. Ask your health care provider how often you should have your eyes checked. Personal lifestyle choices, including: Daily care of your teeth and gums. Regular physical activity. Eating a healthy diet. Avoiding tobacco and drug use. Limiting alcohol use. Practicing safe sex. Taking low-dose aspirin every day. Taking vitamin and mineral supplements as recommended by your health care provider. What happens during an annual well check? The services and screenings done by your health care provider during your annual well check will depend on your age, overall health, lifestyle risk  factors, and family history of disease. Counseling  Your health care provider may ask you questions about your: Alcohol use. Tobacco use. Drug use. Emotional well-being. Home and relationship well-being. Sexual activity. Eating habits. History of falls. Memory and ability to understand (cognition). Work and work Statistician. Reproductive health. Screening  You may have the following tests or measurements: Height, weight, and BMI. Blood pressure. Lipid and cholesterol levels. These may be checked every 5 years, or more frequently if you are over 69 years old. Skin check. Lung cancer screening. You may have this screening every year starting at age 57 if you have a 30-pack-year history of smoking and currently smoke or have quit within the past 15 years. Fecal occult blood test (FOBT) of the stool. You may have this test every year starting at age 36. Flexible sigmoidoscopy or colonoscopy. You may have a sigmoidoscopy every 5 years or a colonoscopy every 10 years starting at age 11. Hepatitis C blood test. Hepatitis B blood test. Sexually transmitted disease (STD) testing. Diabetes screening. This is done by checking your blood sugar (glucose) after you have not eaten for a while (fasting). You may have this done every 1-3 years. Bone density scan. This is done to screen for osteoporosis. You may have this done starting at age 45. Mammogram. This may be done every 1-2 years. Talk to your health care provider about how often you should have regular mammograms. Talk with your health care provider about your test  results, treatment options, and if necessary, the need for more tests. Vaccines  Your health care provider may recommend certain vaccines, such as: Influenza vaccine. This is recommended every year. Tetanus, diphtheria, and acellular pertussis (Tdap, Td) vaccine. You may need a Td booster every 10 years. Zoster vaccine. You may need this after age 33. Pneumococcal 13-valent  conjugate (PCV13) vaccine. One dose is recommended after age 53. Pneumococcal polysaccharide (PPSV23) vaccine. One dose is recommended after age 42. Talk to your health care provider about which screenings and vaccines you need and how often you need them. This information is not intended to replace advice given to you by your health care provider. Make sure you discuss any questions you have with your health care provider. Document Released: 03/15/2015 Document Revised: 11/06/2015 Document Reviewed: 12/18/2014 Elsevier Interactive Patient Education  2017 [place] Prevention in the Home Falls can cause injuries. They can happen to people of all ages. There are many things you can do to make your home safe and to help prevent falls. What can I do on the outside of my home? Regularly fix the edges of walkways and driveways and fix any cracks. Remove anything that might make you trip as you walk through a door, such as a raised step or threshold. Trim any bushes or trees on the path to your home. Use bright outdoor lighting. Clear any walking paths of anything that might make someone trip, such as rocks or tools. Regularly check to see if handrails are loose or broken. Make sure that both sides of any steps have handrails. Any raised decks and porches should have guardrails on the edges. Have any leaves, snow, or ice cleared regularly. Use sand or salt on walking paths during winter. Clean up any spills in your garage right away. This includes oil or grease spills. What can I do in the bathroom? Use night lights. Install grab bars by the toilet and in the tub and shower. Do not use towel bars as grab bars. Use non-skid mats or decals in the tub or shower. If you need to sit down in the shower, use a plastic, non-slip stool. Keep the floor dry. Clean up any water that spills on the floor as soon as it happens. Remove soap buildup in the tub or shower regularly. Attach bath mats  securely with double-sided non-slip rug tape. Do not have throw rugs and other things on the floor that can make you trip. What can I do in the bedroom? Use night lights. Make sure that you have a light by your bed that is easy to reach. Do not use any sheets or blankets that are too big for your bed. They should not hang down onto the floor. Have a firm chair that has side arms. You can use this for support while you get dressed. Do not have throw rugs and other things on the floor that can make you trip. What can I do in the kitchen? Clean up any spills right away. Avoid walking on wet floors. Keep items that you use a lot in easy-to-reach places. If you need to reach something above you, use a strong step stool that has a grab bar. Keep electrical cords out of the way. Do not use floor polish or wax that makes floors slippery. If you must use wax, use non-skid floor wax. Do not have throw rugs and other things on the floor that can make you trip. What can I do with my stairs?  Do not leave any items on the stairs. Make sure that there are handrails on both sides of the stairs and use them. Fix handrails that are broken or loose. Make sure that handrails are as long as the stairways. Check any carpeting to make sure that it is firmly attached to the stairs. Fix any carpet that is loose or worn. Avoid having throw rugs at the top or bottom of the stairs. If you do have throw rugs, attach them to the floor with carpet tape. Make sure that you have a light switch at the top of the stairs and the bottom of the stairs. If you do not have them, ask someone to add them for you. What else can I do to help prevent falls? Wear shoes that: Do not have high heels. Have rubber bottoms. Are comfortable and fit you well. Are closed at the toe. Do not wear sandals. If you use a stepladder: Make sure that it is fully opened. Do not climb a closed stepladder. Make sure that both sides of the stepladder  are locked into place. Ask someone to hold it for you, if possible. Clearly mark and make sure that you can see: Any grab bars or handrails. First and last steps. Where the edge of each step is. Use tools that help you move around (mobility aids) if they are needed. These include: Canes. Walkers. Scooters. Crutches. Turn on the lights when you go into a dark area. Replace any light bulbs as soon as they burn out. Set up your furniture so you have a clear path. Avoid moving your furniture around. If any of your floors are uneven, fix them. If there are any pets around you, be aware of where they are. Review your medicines with your doctor. Some medicines can make you feel dizzy. This can increase your chance of falling. Ask your doctor what other things that you can do to help prevent falls. This information is not intended to replace advice given to you by your health care provider. Make sure you discuss any questions you have with your health care provider. Document Released: 12/13/2008 Document Revised: 07/25/2015 Document Reviewed: 03/23/2014 Elsevier Interactive Patient Education  2017 [employer].

## 2021-04-17 ENCOUNTER — Telehealth: Payer: Self-pay | Admitting: Pharmacist

## 2021-04-17 NOTE — Progress Notes (Signed)
Office Visit Note  Patient: Jade Cardenas             Date of Birth: Sep 03, 1950           MRN: [id number]             PCP: [name], MD Referring: [name], MD Visit Date: 04/30/2021 Occupation: _0 @  Subjective:  Pain in both hands and feet  History of Present Illness: Jade Cardenas is a 71 y.o. female with history of psoriatic arthritis, osteoarthritis, DDD, and fibromyalgia.  Patient remains on Simponi 50 mg sq injections every month, methotrexate 0.3 mL sq injections once weekly, folic acid 1 mg daily.  She has been experiencing increased pain in both hands and both feet.  She denies any joint swelling.  She has been experiencing bilateral feet pain, numbness, and tingling.  She was evaluated by Dr. Earleen Newport in the past who started the patient on Cymbalta.  She had minimal improvement so she was referred to Dr. Posey Pronto.  She had an MRI of the lumbar spine on 01/31/2021 followed by nerve conduction study on 04/03/21.  The nerve conduction study was normal on 04/03/2021. She has a few patches of psoriasis on her scalp. She uses betamethasone topically as needed.     Activities of Daily Living:  Patient reports morning stiffness for a few hours.   Patient Reports nocturnal pain.  Difficulty dressing/grooming: Denies Difficulty climbing stairs: Reports Difficulty getting out of chair: Denies Difficulty using hands for taps, buttons, cutlery, and/or writing: Reports  Review of Systems  Constitutional:  Positive for fatigue.  HENT:  Positive for mouth dryness and nose dryness. Negative for mouth sores.   Eyes:  Positive for itching and dryness. Negative for pain.  Respiratory:  Negative for shortness of breath and difficulty breathing.   Cardiovascular:  Negative for chest pain and palpitations.  Gastrointestinal:  Negative for blood in stool, constipation and diarrhea.  Endocrine: Negative for increased urination.  Genitourinary:  Negative for difficulty  urinating.  Musculoskeletal:  Positive for joint pain, joint pain, joint swelling, myalgias, morning stiffness, muscle tenderness and myalgias.  Skin:  Negative for color change, rash and redness.  Allergic/Immunologic: Negative for susceptible to infections.  Neurological:  Positive for numbness. Negative for dizziness, headaches, memory loss and weakness.  Hematological:  Positive for bruising/bleeding tendency.  Psychiatric/Behavioral:  Negative for confusion.    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   Fibromyalgia 05/17/2016   DJD (degenerative joint disease), cervical 02/18/2016   Heel spur 09/29/2015   Ingrown toenail 09/29/2015   Foraminal stenosis of cervical region 07/30/2015   Microcytic hypochromic anemia 05/22/2015   Tailor's bunion, left 01/18/2015   H/O hiatal hernia    Bilateral primary osteoarthritis of knee 12/15/2013   Iron deficiency 12/15/2013   Psoriatic arthritis ([place]) 12/15/2013   History of foot fracture 12/15/2013   Dermatitis 10/17/2013   Hx of diethylstilbestrol (DES) exposure in utero unknown 12/09/2012   Tinnitus 06/23/2012   Scalp lesion 01/21/2012   Headache(784.0) 01/21/2012   Hx of psoriasis 01/21/2012   Weight gain 01/21/2012   Ankylosing spondylitis  possible 09/03/2011  Hx of transfusion 09/03/2011   S/P lumbar fusion 6 13 09/03/2011   Lumbar stenosis with neurogenic claudication 08/05/2011   ALLERGIC RHINITIS 02/19/2010   GERD 02/19/2010   SPINAL STENOSIS, CERVICAL 02/19/2010   OSTEOPENIA 02/19/2010   IBS 04/28/2007   History of gastric ulcer 02/09/2007    COLONIC POLYPS, HX OF 02/09/2007   CHOLECYSTECTOMY, HX OF 02/09/2007   Hyperlipidemia 12/29/2006   Essential hypertension 12/29/2006    Past Medical History:  Diagnosis Date   Anemia    iron def   Arthritis    rheumatoid, psoriatic, osteoarthritis    Bone spur    RIGHT FOOT    Broken toe    Bruises easily    Cellulitis    Chronic back pain    COLONIC POLYPS, HX OF 02/09/2007   Cough, persistent 01/21/2012   poss from acei    DES exposure in utero, unknown    Femur fracture, left (HCC)    Fibromyalgia    doesn't require meds   GASTRIC ULCER, ACUTE, HEMORRHAGE, HX OF 02/09/2007   GERD (gastroesophageal reflux disease)    takes Nexium daily   Goiter    H/O hiatal hernia    H/O: hysterectomy    when 71 yrs old   Hx of seasonal allergies    takes Claritin daily   HYPERLIPIDEMIA 12/29/2006   takes Lovastatin nightly   HYPERTENSION 12/29/2006   takes Enalapril daily   IBS 04/28/2007   Insomnia    related to pain;takes Flexeril and Tylenol PM nightly   Joint pain    Joint swelling    Osteopenia    Palpitations 02/09/2007   PONV (postoperative nausea and vomiting)    NAUSEA   REDUCTION MAMMOPLASTY, HX OF 02/09/2007   Right bundle branch block 02/09/2007   S/P lumbar fusion 6 13 09/03/2011   L4 L5  posterior    SYNCOPE 12/07/2007   Tendonitis    RLE    UNS ADVRS EFF OTH RX MEDICINAL\T\BIOLOGICAL SBSTNC 02/09/2007    Family History  Problem Relation Age of Onset   Diabetes Mother    Colon cancer Mother        x2   Arthritis Mother    Hypertension Mother    Endometrial cancer Mother    Stroke Father    Heart disease Father    Colon cancer Father    Arthritis Brother    Hypertension Brother    Hyperlipidemia Brother    Infertility Brother    Diabetes Brother    Other Other        DISH brother   Hypertension Maternal Grandmother    Stroke Maternal Grandmother    Anesthesia problems Neg Hx    Hypotension Neg Hx    Malignant hyperthermia Neg Hx     Pseudochol deficiency Neg Hx    Past Surgical History:  Procedure Laterality Date   BACK SURGERY  12+yrs ago   Synovial Cyst removal   BACK SURGERY  6/13   lumbar L4-L5 fusion, lamenectomy   bone spur  6-73yrs ago   right ankle   BUNIONECTOMY  10/03/14   right foot   CERVICAL FUSION     2 12    CHOLECYSTECTOMY  1996   HARDWARE REMOVAL Left 08/01/2018   Procedure: Left knee hardware removal;  Surgeon: Gaynelle Arabian, MD;  Location: [facility];  Service: Orthopedics;  Laterality: Left;  80min   JOINT REPLACEMENT     KNEE ARTHROPLASTY     POSTERIOR CERVICAL LAMINECTOMY Left 07/30/2015  Procedure: Laminectomy and Foraminotomy - left - Cervical two -Cervical three;  Surgeon: Earnie Larsson, MD;  Location: [facility];  Service: Neurosurgery;  Laterality: Left;   REDUCTION MAMMAPLASTY Bilateral 1989   TONSILLECTOMY     as a child   TOTAL ABDOMINAL HYSTERECTOMY W/ BILATERAL SALPINGOOPHORECTOMY  1986   TOTAL KNEE ARTHROPLASTY N/A 06/21/2017   Procedure: LEFT TOTAL KNEE ARTHROPLASTY AND RIGHT KNEE CORTISONE INJECTION;  Surgeon: Gaynelle Arabian, MD;  Location: [facility];  Service: Orthopedics;  Laterality: N/A;   Social History   Social History Narrative   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  Husband died suddenly 2010Non smoker         Left handed    Lives in a one story home       Immunization History  Administered Date(s) Administered   Fluad Quad(high Dose 65+) 11/04/2018, 11/07/2019, 11/21/2020   Influenza Split 11/29/2012, 11/27/2013   Influenza Whole 11/09/2008   Influenza, High Dose Seasonal PF 11/26/2015, 11/30/2016, 11/03/2017, 12/05/2020   Influenza,inj,Quad PF,6+ Mos 11/27/2014   Influenza-Unspecified 12/22/2016   PFIZER Comirnaty(Gray Top)Covid-19 Tri-Sucrose Vaccine 03/28/2020   PFIZER(Purple Top)SARS-COV-2 Vaccination 04/08/2019, 05/03/2019, 10/19/2019, 03/28/2020   PPD Test 12/18/2013, 11/27/2014, 11/26/2015, 11/30/2016    Pfizer Covid-19 Vaccine Bivalent Booster 39yr & up 11/21/2020   Pneumococcal Conjugate-13 02/28/2013   Pneumococcal Polysaccharide-23 03/03/2003, 02/28/2014, 11/07/2019   Td 05/09/2008   Tdap 07/19/2018   Zoster Recombinat (Shingrix) 12/03/2016, 02/04/2017   Zoster, Live 01/20/2011     Objective: Vital Signs: BP 128/75 (BP Location: Left Arm, Patient Position: Sitting, Cuff Size: Normal)    Pulse 73    Ht _0  (1.626 m)    Wt 198 lb 9.6 oz (90.1 kg)    LMP 03/02/1984    BMI 34.09 kg/m    Physical Exam Vitals and nursing note reviewed.  Constitutional:      Appearance: She is well-developed.  HENT:     Head: Normocephalic and atraumatic.  Eyes:     Conjunctiva/sclera: Conjunctivae normal.  Cardiovascular:     Rate and Rhythm: Normal rate and regular rhythm.     Heart sounds: Normal heart sounds.  Pulmonary:     Effort: Pulmonary effort is normal.     Breath sounds: Normal breath sounds.  Abdominal:     General: Bowel sounds are normal.     Palpations: Abdomen is soft.  Musculoskeletal:     Cervical back: Normal range of motion.  Lymphadenopathy:     Cervical: No cervical adenopathy.  Skin:    General: Skin is warm and dry.     Capillary Refill: Capillary refill takes less than 2 seconds.  Neurological:     Mental Status: She is alert and oriented to person, place, and time.  Psychiatric:        Behavior: Behavior normal.     Musculoskeletal Exam: Limited range of motion with lateral rotation as well as with extension of the C-spine.  Limited range of motion of the lumbar spine.  Shoulder joints, elbow joints, and wrist joints have good range of motion with no discomfort.  PIP and DIP thickening consistent with osteoarthritis of both hands.  Tenderness and thickening over both CMC joints.  Knee joints have good range of motion with no warmth or effusion.  Ankle joints have good range of motion with no tenderness or joint swelling.  No evidence of Achilles  tendinitis.  CDAI Exam: CDAI Score: -- Patient Global: --; Provider Global: --  Swollen: --; Tender: -- Joint Exam 04/30/2021   No joint exam has been documented for this visit   There is currently no information documented on the homunculus. Go to the Rheumatology activity and complete the homunculus joint exam.  Investigation: No additional findings.  Imaging: NCV with EMG(electromyography)  Result Date: 04/03/2021 Alda Berthold, DO     04/03/2021 12:24 PM [facility] [place], [address]  [place], [place] [address] Tel: [phone] Fax:  [phone] Test Date:  04/03/2021 Patient: Tinlee Navarrette DOB: April 22, 1950 Physician: Narda Amber, DO Sex: Female Height: _0  Ref Phys: Narda Amber, DO ID#: [id number]   Technician:  Patient Complaints: This is a 71 year old female referred for evaluation of bilateral feet paresthesias and pain. NCV & EMG Findings: Electrodiagnostic testing of the right lower extremity and additional studies of the left shows: Bilateral sural and superficial peroneal sensory responses are within normal limits. Bilateral peroneal and tibial motor responses are within normal limits. Bilateral tibial H reflex studies are within normal limits. There is no evidence of active or chronic motor axonal changes affecting any of the tested muscles.  Motor unit configuration and recruitment pattern is within normal limits. Impression: This is a normal study of the lower extremities.  In particular, there is no evidence of a sensorimotor polyneuropathy or lumbosacral radiculopathy. ___________________________ Narda Amber, DO Nerve Conduction Studies Anti Sensory Summary Table  Stim Site NR Peak (ms) Norm Peak (ms) P-T Amp (V) Norm P-T Amp Left Sup Peroneal Anti Sensory (Ant Lat Mall)  34C 12 cm    3.1 <4.6 7.4 >3 Right Sup Peroneal Anti Sensory (Ant Lat Mall)  34C 12 cm    2.8 <4.6 7.0 >3 Left Sural Anti Sensory (Lat Mall)  34C Calf    2.6 <4.6 9.8 >3 Right Sural  Anti Sensory (Lat Mall)  34C Calf    2.6 <4.6 10.1 >3 Motor Summary Table  Stim Site NR Onset (ms) Norm Onset (ms) O-P Amp (mV) Norm O-P Amp Site1 Site2 Delta-0 (ms) Dist (cm) Vel (m/s) Norm Vel (m/s) Left Peroneal Motor (Ext Dig Brev)  34C Ankle    3.3 <6.0 3.3 >2.5 B Fib Ankle 8.0 37.0 46 >40 B Fib    11.3  3.2  Poplt B Fib 1.4 7.0 50 >40 Poplt    12.7  3.3        Right Peroneal Motor (Ext Dig Brev)  34C Ankle    2.6 <6.0 2.9 >2.5 B Fib Ankle 8.0 38.0 48 >40 B Fib    10.6  2.5  Poplt B Fib 1.4 7.0 50 >40 Poplt    12.0  2.4        Left Tibial Motor (Abd Hall Brev)  34C Ankle    4.5 <6.0 8.1 >4 Knee Ankle 8.2 41.0 50 >40 Knee    12.7  6.4        Right Tibial Motor (Abd Hall Brev)  34C Ankle    5.3 <6.0 6.2 >4 Knee Ankle 7.2 40.0 56 >40 Knee    12.5  4.7        H Reflex Studies  NR H-Lat (ms) Lat Norm (ms) L-R H-Lat (ms) Left Tibial (Gastroc)  34C    34.29 <35 2.45 Right Tibial (Gastroc)  34C    31.84 <35 2.45 EMG  Side Muscle Ins Act Fibs Psw Fasc Number Recrt Dur Dur. Amp Amp. Poly Poly. Comment Right AntTibialis _1  _2  Nml Nml  N/A Right Gastroc _0  _1  Nml Nml N/A Right Flex Dig Long _2  _3  Nml Nml N/A Right RectFemoris _4  _5  Nml Nml N/A Right GluteusMed _6  _7  Nml Nml N/A Right BicepsFemS _8  _9  Nml Nml N/A Left AntTibialis _10  _11  Nml Nml N/A Left Gastroc _12  _13  Nml Nml N/A Left Flex Dig Long _14  _15  Nml Nml N/A Left GluteusMed _16  _17  Nml Nml N/A Left RectFemoris _18  _19  Nml Nml N/A Left BicepsFemS _20  _21  Nml Nml N/A Waveforms:             XR Foot 2 Views Left  Result Date: 04/30/2021 First MTP, PIP and DIP narrowing  was noted.  Cystic changes were noted in the metatarsals.  A screw was noted in the fifth metatarsal.  Intertarsal narrowing and dorsal spurring was noted.  No tibiotalar or subtalar joint space narrowing was noted.  Inferior and posterior calcaneal spurs were noted.  No radiographic progression was noted when compared to the x-rays of 2020. Pression: These findings are consistent with psoriatic arthritis and osteoarthritis overlap.  XR Foot 2 Views Right  Result Date: 04/30/2021 First MTP narrowing was noted.  Severe PIP and DIP narrowing was noted.  Cystic changes were noted in the metatarsals.  Intertarsal joint space narrowing with dorsal spurring was noted.  No tibiotalar or subtalar joint space narrowing was noted.  Large inferior and posterior calcaneal spurs were noted.  A screw was noted in the fifth MTP. Impression: This findings are consistent with psoriatic arthritis and osteoarthritis overlap.  No radiographic progression was noted when compared to the x-rays of 2022.  XR Hand 2 View Left  Result Date: 04/30/2021 Juxta-articular osteopenia was noted.  Mild MCP narrowing was noted.  PIP and DIP narrowing was noted.  Severe CMC narrowing noted.  Mild intercarpal and radiocarpal joint space narrowing was noted.  Cystic changes were noted in the carpal bones.  No radiographic progression was noted when compared to the x-rays of 2020. Impression: These findings are consistent with psoriatic arthritis and osteoarthritis overlap.  XR Hand 2 View Right  Result Date: 04/30/2021 Juxta-articular osteopenia was noted.  Mild MCP narrowing was noted.  Severe CMC narrowing was noted.  PIP and DIP narrowing was noted.  Intercarpal and radiocarpal joint space narrowing was noted.  Cystic changes were noted in the carpal bones.  No radiographic progression was noted when compared to the x-rays of 2020. Impression: These findings are consistent with psoriatic arthritis and osteoarthritis overlap.   Recent  Labs: Lab Results  Component Value Date   WBC 9.9 01/29/2021   HGB 13.9 01/29/2021   PLT 355 01/29/2021   NA 140 02/04/2021   K 4.4 02/04/2021   CL 101 02/04/2021   CO2 29 02/04/2021   GLUCOSE 99 02/04/2021   BUN 19 02/04/2021   CREATININE 0.77 02/04/2021   BILITOT 0.7 02/04/2021   ALKPHOS 102 07/25/2020   AST 16 02/04/2021   ALT 13 02/04/2021   PROT 7.0 02/04/2021   ALBUMIN  4.1 07/25/2020   CALCIUM 9.3 02/04/2021   GFRAA 87 02/02/2020   QFTBGOLDPLUS NEGATIVE 10/30/2020    Speciality Comments: Failed therapy: Arava and Otezla due to diarrhea Enbrel, Humira-neuropathy  Procedures:  No procedures performed Allergies: Erythromycin, Iodinated contrast media, Ace inhibitors, Indomethacin, Keflex [cephalexin], and Macrobid [nitrofurantoin]  Evaluated by Dr. Posey Pronto on 01/31/21. She had MRI of the lumbar spine on 02/26/21.  She had a nerve conduction study on 04/03/2021 which was normal.  Psorasis on scalp Increased arthralgias    Assessment / Plan:     Visit Diagnoses: Psoriatic arthritis ([place]) - She has no synovitis or dactylitis on examination.  She has not had any signs or symptoms of a psoriatic arthritis flare.  No evidence of Achilles tendinitis or planter fasciitis.  No SI joint tenderness upon palpation.  She has been experiencing increased aching in both hands and both feet due to underlying osteoarthritis.  She has tenderness palpation and thickening over both CMC joints on examination today.  Overall her psoriatic arthritis is well controlled on Simponi 50 mg sq injections once monthly and methotrexate 0.3 mL subcutaneous injections once weekly.  No medication changes will be made at this time. X-rays of both hands and feet were updated today to assess for radiographic progression.  ESR was also repeated today. She was advised to notify us if she develops increased joint pain or joint swelling.  She will follow-up in the office in 5 months. Plan: XR Hand 2 View Left, XR Hand 2  View Right, XR Foot 2 Views Left, XR Foot 2 Views Right, Sedimentation rate  Hx of psoriasis: She has a few small patches of psoriasis on her scalp.  She had a recent follow-up visit with her dermatologist.  She has been using betamethasone cream as needed.  She will remain on Simponi and methotrexate as prescribed.  High risk medication use - Simponi 50 mg every 28 days and methotrexate 0.3 ml every 7 days folic acid 1 mg p.o. daily..([place] caused diarrhea). CMP updated on 02/04/2021.  CBC drawn on 01/29/2021.  She is due to update lab work today.  Orders for CBC and CMP were released.  Her next lab work will be due in June and every 3 months to monitor for drug toxicity.  TB Gold negative on 10/30/2020 and will continue to be monitored yearly.  - Plan: CBC with Differential/Platelet, COMPLETE METABOLIC PANEL WITH GFR She has not had any recent infections.  Discussed the importance of holding Simponi and methotrexate if she develops signs or symptoms of an infection and to resume once the infection has completely cleared.  Primary osteoarthritis of right knee - Moderate osteoarthritis and severe chondromalacia patella.  She has good range of motion of the right knee joint on examination today.  No warmth or effusion was noted.  Total knee replacement status, left - by Dr. Wynelle Link.  Doing well.  DDD (degenerative disc disease), cervical - S/P fusion x2: She has limited range of motion without rotation as well as extension.  No symptoms of radiculopathy at this time.  DDD (degenerative disc disease), lumbar - S/P fusion.  She had a recent MRI of the lumbar spine on 02/26/2021 and results reviewed today in the office: Prior L4-L5 PLIF with new mild adjacent segment disease at L3-L4. No high-grade spinal canal or neuroforaminal stenosis at any level.  Progressive severe bilateral facet arthropathy at L5-S1. She had a NCV with EMG on 04/03/2021 which was normal.  Fibromyalgia: She experiences  intermittent myalgias and muscle tenderness due to fibromyalgia.  She is taking Cymbalta 30 mg 1 capsule daily.  She continues to experience chronic fatigue.  Discussed the importance of regular exercise and good sleep hygiene.  Chronic fatigue -Patient has been experiencing increased fatigue.  She requested to have a iron panel checked today.  Plan: Sedimentation rate  Osteopenia of multiple sites - DEXA 09/15/17 osteopenia T -1.8.  Followed by her GYN.  Updated DXA on 11/14/20: The BMD measured at DualFemur Neck Right is 0.829 g/cm2 with a T-score of -1.5.  She is taking a calcium and vitamin D supplement on a daily basis.  No recent falls or fractures.  Other iron deficiency anemia -Patient requested to have iron panel rechecked.  She has been experiencing increased fatigue. She has had iron infusions in the past.  Plan: Iron, TIBC and Ferritin Panel  Tick bite of abdomen, subsequent encounter  Orders: Orders Placed This Encounter  Procedures   XR Hand 2 View Left   XR Hand 2 View Right   XR Foot 2 Views Left   XR Foot 2 Views Right   CBC with Differential/Platelet   COMPLETE METABOLIC PANEL WITH GFR   Iron, TIBC and Ferritin Panel   Sedimentation rate   No orders of the defined types were placed in this encounter.    Follow-Up Instructions: Return in about 5 months (around 09/30/2021) for Psoriatic arthritis, Osteoarthritis, DDD, Fibromyalgia.   Ofilia Neas, PA-C  Note - This record has been created using Dragon software.  Chart creation errors have been sought, but may not always  have been located. Such creation errors do not reflect on  the standard of medical care.

## 2021-04-17 NOTE — Telephone Encounter (Signed)
Received a fax on 04/09/21 regarding Prior Authorization from [facility] for [facility]. Authorization has been DENIED because patient has not tried at least 2 covered drugs: Humira, Enbrel, Otezla, Remicade.  Submitted an appeal to [facility] for [facility]. Expected turnaround time is 7 business days  Phone# [phone] Fax: [phone]  Still awaiting [facility] pt assistance approval  Knox Saliva, PharmD, MPH, BCPS Clinical Pharmacist (Rheumatology and Pulmonology)

## 2021-04-21 NOTE — Telephone Encounter (Signed)
Received notification from [facility] regarding a prior authorization for [facility]. Authorization has been APPROVED from 04/09/21 until further notice.  Patient receives through patient assistance (still awaiting approval from program)  Phone # [phone]  Knox Saliva, PharmD, MPH, BCPS Clinical Pharmacist (Rheumatology and Pulmonology)

## 2021-04-30 ENCOUNTER — Ambulatory Visit: Payer: Self-pay

## 2021-04-30 ENCOUNTER — Other Ambulatory Visit: Payer: Self-pay

## 2021-04-30 ENCOUNTER — Encounter: Payer: Self-pay | Admitting: Physician Assistant

## 2021-04-30 ENCOUNTER — Ambulatory Visit (INDEPENDENT_AMBULATORY_CARE_PROVIDER_SITE_OTHER): Payer: Medicare Other | Admitting: Physician Assistant

## 2021-04-30 VITALS — BP 128/75 | HR 73 | Ht 64.0 in | Wt 198.6 lb

## 2021-04-30 DIAGNOSIS — M79641 Pain in right hand: Secondary | ICD-10-CM | POA: Diagnosis not present

## 2021-04-30 DIAGNOSIS — M797 Fibromyalgia: Secondary | ICD-10-CM | POA: Diagnosis not present

## 2021-04-30 DIAGNOSIS — Z96652 Presence of left artificial knee joint: Secondary | ICD-10-CM

## 2021-04-30 DIAGNOSIS — M79672 Pain in left foot: Secondary | ICD-10-CM

## 2021-04-30 DIAGNOSIS — M1711 Unilateral primary osteoarthritis, right knee: Secondary | ICD-10-CM | POA: Diagnosis not present

## 2021-04-30 DIAGNOSIS — D508 Other iron deficiency anemias: Secondary | ICD-10-CM | POA: Diagnosis not present

## 2021-04-30 DIAGNOSIS — M79671 Pain in right foot: Secondary | ICD-10-CM | POA: Diagnosis not present

## 2021-04-30 DIAGNOSIS — M5136 Other intervertebral disc degeneration, lumbar region: Secondary | ICD-10-CM | POA: Diagnosis not present

## 2021-04-30 DIAGNOSIS — S30861D Insect bite (nonvenomous) of abdominal wall, subsequent encounter: Secondary | ICD-10-CM | POA: Diagnosis not present

## 2021-04-30 DIAGNOSIS — M8589 Other specified disorders of bone density and structure, multiple sites: Secondary | ICD-10-CM

## 2021-04-30 DIAGNOSIS — M5030 Other cervical disc degeneration, unspecified cervical region: Secondary | ICD-10-CM

## 2021-04-30 DIAGNOSIS — Z79899 Other long term (current) drug therapy: Secondary | ICD-10-CM

## 2021-04-30 DIAGNOSIS — M79642 Pain in left hand: Secondary | ICD-10-CM

## 2021-04-30 DIAGNOSIS — L4050 Arthropathic psoriasis, unspecified: Secondary | ICD-10-CM

## 2021-04-30 DIAGNOSIS — Z872 Personal history of diseases of the skin and subcutaneous tissue: Secondary | ICD-10-CM

## 2021-04-30 DIAGNOSIS — R5382 Chronic fatigue, unspecified: Secondary | ICD-10-CM

## 2021-04-30 DIAGNOSIS — W57XXXD Bitten or stung by nonvenomous insect and other nonvenomous arthropods, subsequent encounter: Secondary | ICD-10-CM

## 2021-04-30 NOTE — Progress Notes (Signed)
X-rays of both hands and feet are consistent with psoriatic arthritis and osteoarthritis overlap.  No radiographic progression was noted since 2020.  Please notify the patient.

## 2021-04-30 NOTE — Progress Notes (Signed)
Medication Samples have been provided to the patient. ? ?Drug name: Simponi 50mg /0.23mL autoinjector ?Qty: 1 pen ?LOT: [id number] ?Exp.Date: 04/2022 ? ?Knox Saliva, PharmD, MPH, BCPS ?Clinical Pharmacist (Rheumatology and Pulmonology) ?

## 2021-05-01 LAB — CBC WITH DIFFERENTIAL/PLATELET
Absolute Monocytes: 410 cells/uL (ref 200–950)
Basophils Absolute: 40 cells/uL (ref 0–200)
Basophils Relative: 0.7 %
Eosinophils Absolute: 91 cells/uL (ref 15–500)
Eosinophils Relative: 1.6 %
HCT: 42.5 % (ref 35.0–45.0)
Hemoglobin: 14.3 g/dL (ref 11.7–15.5)
Lymphs Abs: 1744 cells/uL (ref 850–3900)
MCH: 32.4 pg (ref 27.0–33.0)
MCHC: 33.6 g/dL (ref 32.0–36.0)
MCV: 96.4 fL (ref 80.0–100.0)
MPV: 10.4 fL (ref 7.5–12.5)
Monocytes Relative: 7.2 %
Neutro Abs: 3414 cells/uL (ref 1500–7800)
Neutrophils Relative %: 59.9 %
Platelets: 329 10*3/uL (ref 140–400)
RBC: 4.41 10*6/uL (ref 3.80–5.10)
RDW: 13 % (ref 11.0–15.0)
Total Lymphocyte: 30.6 %
WBC: 5.7 10*3/uL (ref 3.8–10.8)

## 2021-05-01 LAB — SEDIMENTATION RATE: Sed Rate: 55 mm/h — ABNORMAL HIGH (ref 0–30)

## 2021-05-01 LAB — COMPLETE METABOLIC PANEL WITH GFR
AG Ratio: 1.4 (calc) (ref 1.0–2.5)
ALT: 16 U/L (ref 6–29)
AST: 16 U/L (ref 10–35)
Albumin: 4.5 g/dL (ref 3.6–5.1)
Alkaline phosphatase (APISO): 106 U/L (ref 37–153)
BUN: 22 mg/dL (ref 7–25)
CO2: 28 mmol/L (ref 20–32)
Calcium: 9.9 mg/dL (ref 8.6–10.4)
Chloride: 103 mmol/L (ref 98–110)
Creat: 0.82 mg/dL (ref 0.50–1.05)
Globulin: 3.2 g/dL (calc) (ref 1.9–3.7)
Glucose, Bld: 98 mg/dL (ref 65–99)
Potassium: 4.9 mmol/L (ref 3.5–5.3)
Sodium: 140 mmol/L (ref 135–146)
Total Bilirubin: 0.7 mg/dL (ref 0.2–1.2)
Total Protein: 7.7 g/dL (ref 6.1–8.1)
eGFR: 78 mL/min/{1.73_m2} (ref >=60)

## 2021-05-01 LAB — IRON,TIBC AND FERRITIN PANEL
%SAT: 26 % (calc) (ref 16–45)
Ferritin: 92 ng/mL (ref 16–288)
Iron: 89 ug/dL (ref 45–160)
TIBC: 339 mcg/dL (calc) (ref 250–450)

## 2021-05-01 NOTE — Progress Notes (Signed)
CBC and CMP WNL.  ESR remains slightly elevated but has improved. Iron panel is WNL. Patient requested to have results forwarded to her PCP and hematologist.

## 2021-05-01 NOTE — Telephone Encounter (Signed)
Received call from [employer] rep late yesterday afternoon stating that rx form for Simponi requires refills to be written. I advised that we wrote for qty of one with zero refills but rep stated that refills cannot state zero. Updated rx form faxed today with one refill with note on cover sheet to expedite case. ? ?Fax: [phone] ?Phone: [phone] ? ?Knox Saliva, PharmD, MPH, BCPS ?Clinical Pharmacist (Rheumatology and Pulmonology) ?

## 2021-05-12 ENCOUNTER — Ambulatory Visit (INDEPENDENT_AMBULATORY_CARE_PROVIDER_SITE_OTHER): Payer: Medicare Other | Admitting: Internal Medicine

## 2021-05-12 ENCOUNTER — Encounter: Payer: Self-pay | Admitting: Internal Medicine

## 2021-05-12 VITALS — BP 124/80 | HR 80 | Temp 97.9°F | Ht 64.0 in | Wt 198.0 lb

## 2021-05-12 DIAGNOSIS — L4050 Arthropathic psoriasis, unspecified: Secondary | ICD-10-CM | POA: Diagnosis not present

## 2021-05-12 DIAGNOSIS — Z79899 Other long term (current) drug therapy: Secondary | ICD-10-CM | POA: Diagnosis not present

## 2021-05-12 DIAGNOSIS — I10 Essential (primary) hypertension: Secondary | ICD-10-CM | POA: Diagnosis not present

## 2021-05-12 DIAGNOSIS — M79672 Pain in left foot: Secondary | ICD-10-CM | POA: Diagnosis not present

## 2021-05-12 DIAGNOSIS — E611 Iron deficiency: Secondary | ICD-10-CM | POA: Diagnosis not present

## 2021-05-12 DIAGNOSIS — N281 Cyst of kidney, acquired: Secondary | ICD-10-CM | POA: Diagnosis not present

## 2021-05-12 NOTE — Patient Instructions (Addendum)
Good to see you today . ? ?Would repeat iron studies   in 3-4 months  instead of  6 months .  ? ?May  consider  US imaging  to better define renal cyst.  ? ? ? ? ? ?

## 2021-05-12 NOTE — Progress Notes (Signed)
Chief Complaint  Patient presents with   medication check    HPI: Jade Cardenas 71 y.o. come in for Chronic disease management 6 mos check   Fatigue  and  iron deficiency of concern.  See lab work no active bleeding is open to going back to hematology for infusion of iron if needed.  Has mildly elevated ESR.  Foot pain: Podiatry sent to neuro  had MRI  and ncs    no dx of neuropathy . ? If arthritis . Feels  that could fall.  At times the large toe joint is the area of concern.  No chest pain shortness of breath extra swelling reported.  Still on Simponi cost of medicines becoming an issue with obtaining under her current insurance plan.  Eating tear if it will be covered through the manufacturer. Had an MRI done of her back because of lower extremity symptoms and incidental finding that they were renal cysts asked if there is need for follow-up.  Blood pressure usually controlled 120/80 range or below has a log.  ROS: See pertinent positives and negatives per HPI. Had prolonged significant coughing illness in December see 12/7/note resolved was given doxycycline. Past Medical History:  Diagnosis Date   Anemia    iron def   Arthritis    rheumatoid, psoriatic, osteoarthritis    Bone spur    RIGHT FOOT    Broken toe    Bruises easily    Cellulitis    Chronic back pain    COLONIC POLYPS, HX OF 02/09/2007   Cough, persistent 01/21/2012   poss from acei    DES exposure in utero, unknown    Femur fracture, left (HCC)    Fibromyalgia    doesn't require meds   GASTRIC ULCER, ACUTE, HEMORRHAGE, HX OF 02/09/2007   GERD (gastroesophageal reflux disease)    takes Nexium daily   Goiter    H/O hiatal hernia    H/O: hysterectomy    when 71 yrs old   Hx of seasonal allergies    takes Claritin daily   HYPERLIPIDEMIA 12/29/2006   takes Lovastatin nightly   HYPERTENSION 12/29/2006   takes Enalapril daily   IBS 04/28/2007   Insomnia    related to pain;takes Flexeril and  Tylenol PM nightly   Joint pain    Joint swelling    Osteopenia    Palpitations 02/09/2007   PONV (postoperative nausea and vomiting)    NAUSEA   REDUCTION MAMMOPLASTY, HX OF 02/09/2007   Right bundle branch block 02/09/2007   S/P lumbar fusion 6 13 09/03/2011   L4 L5  posterior    SYNCOPE 12/07/2007   Tendonitis    RLE    UNS ADVRS EFF OTH RX MEDICINAL\T\BIOLOGICAL SBSTNC 02/09/2007    Family History  Problem Relation Age of Onset   Diabetes Mother    Colon cancer Mother        x2   Arthritis Mother    Hypertension Mother    Endometrial cancer Mother    Stroke Father    Heart disease Father    Colon cancer Father    Arthritis Brother    Hypertension Brother    Hyperlipidemia Brother    Infertility Brother    Diabetes Brother    Other Other        DISH brother   Hypertension Maternal Grandmother    Stroke Maternal Grandmother    Anesthesia problems Neg Hx    Hypotension Neg Hx    Malignant  hyperthermia Neg Hx    Pseudochol deficiency Neg Hx     Social History   Socioeconomic History   Marital status: Widowed    Spouse name: Not on file   Number of children: 0   Years of education: Not on file   Highest education level: Not on file  Occupational History   Not on file  Tobacco Use   Smoking status: Never   Smokeless tobacco: Never  Vaping Use   Vaping Use: Never used  Substance and Sexual Activity   Alcohol use: No    Alcohol/week: 0.0 standard drinks   Drug use: No   Sexual activity: Not Currently    Partners: Male    Birth control/protection: Surgical    Comment: TAH/BSO  Other Topics Concern   Not on file  Social History Narrative   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  Husband died suddenly 2010Non smoker         Left handed    Lives in a one story home       Social Determinants of Health   Financial Resource Strain: Low Risk    Difficulty of Paying Living Expenses: Not hard at  all  Food Insecurity: No Food Insecurity   Worried About Charity fundraiser in the Last Year: Never true   Arboriculturist in the Last Year: Never true  Transportation Needs: No Transportation Needs   Lack of Transportation (Medical): No   Lack of Transportation (Non-Medical): No  Physical Activity: Sufficiently Active   Days of Exercise per Week: 7 days   Minutes of Exercise per Session: 60 min  Stress: Stress Concern Present   Feeling of Stress : To some extent  Social Connections: Moderately Integrated   Frequency of Communication with Friends and Family: More than three times a week   Frequency of Social Gatherings with Friends and Family: More than three times a week   Attends Religious Services: More than 4 times per year   Active Member of [employer] or Organizations: Yes   Attends Archivist Meetings: More than 4 times per year   Marital Status: Widowed    Outpatient Medications Prior to Visit  Medication Sig Dispense Refill   acetaminophen (TYLENOL) 650 MG CR tablet Take 1,300 mg by mouth See admin instructions. Take 1300 mg in the morning and may take an additional 1300 mg dose as needed for pain     amLODipine (NORVASC) 5 MG tablet TAKE 1 TABLET BY MOUTH EVERY DAY 90 tablet 0   B-D ALLERGY SYRINGE 1CC/28G 28G X 1/2" 1 ML MISC USE AS DIRECTED ONCE WEEKLY 10 each 4   Calcium Carb-Cholecalciferol (CALCIUM+D3 PO) Take 1 tablet by mouth daily.     diphenhydramine-acetaminophen (TYLENOL PM) 25-500 MG TABS tablet Take 1 tablet by mouth at bedtime.     DULoxetine (CYMBALTA) 30 MG capsule TAKE 1 CAPSULE BY MOUTH EVERY DAY 90 capsule 1   losartan (COZAAR) 100 MG tablet TAKE 1 TABLET BY MOUTH EVERY DAY 90 tablet 0   lovastatin (MEVACOR) 20 MG tablet TAKE 1 TABLET BY MOUTH EVERY DAY IN THE EVENING 90 tablet 0   Methotrexate Sodium (METHOTREXATE, PF,) 50 MG/2ML injection INJECT 0.3ML INTO THE SKIN ONCE WEEKLY 8 mL 2   Multiple Vitamins-Minerals (MULTIVITAMIN ADULTS PO) Take 1  tablet by mouth daily.      omeprazole (PRILOSEC) 40 MG capsule TAKE 1 CAPSULE BY MOUTH EVERY DAY 90 capsule  0   SIMPONI 50 MG/0.5ML SOAJ INJECT 50MG (1 PEN) UNDER THE SKIN ONCE MONTHLY 1.5 mL ML   augmented betamethasone dipropionate (DIPROLENE-AF) 0.05 % cream as needed.     Facility-Administered Medications Prior to Visit  Medication Dose Route Frequency Provider Last Rate Last Admin   dexamethasone (DECADRON) injection 4 mg  4 mg Intra-articular Once Trula Slade, DPM         EXAM:  BP 124/80 (BP Location: Left Arm, Patient Position: Sitting, Cuff Size: Normal)    Pulse 80    Temp 97.9 F (36.6 C) (Oral)    Ht '5\' 4"'  (1.626 m)    Wt 198 lb (89.8 kg)    LMP 03/02/1984    SpO2 98%    BMI 33.99 kg/m   Body mass index is 33.99 kg/m.  GENERAL: vitals reviewed and listed above, alert, oriented, appears well hydrated and in no acute distress HEENT: atraumatic, conjunctiva  clear, no obvious abnormalities on inspection of external nose and ears OP : Masked NECK: no obvious masses on inspection palpation  LUNGS: clear to auscultation bilaterally, no wheezes, rales or rhonchi, good air movement CV: HRRR, no clubbing cyanosis or  peripheral edema nl cap refill  MS: moves all extremities arthritic changes No gross acute rash. PSYCH: pleasant and cooperative, no obvious depression or anxiety Lab Results  Component Value Date   WBC 5.7 04/30/2021   HGB 14.3 04/30/2021   HCT 42.5 04/30/2021   PLT 329 04/30/2021   GLUCOSE 98 04/30/2021   CHOL 179 12/17/2020   TRIG 156.0 (H) 12/17/2020   HDL 63.50 12/17/2020   LDLDIRECT 114.0 04/02/2010   LDLCALC 84 12/17/2020   ALT 16 04/30/2021   AST 16 04/30/2021   NA 140 04/30/2021   K 4.9 04/30/2021   CL 103 04/30/2021   CREATININE 0.82 04/30/2021   BUN 22 04/30/2021   CO2 28 04/30/2021   TSH 0.85 12/17/2020   INR 0.96 06/16/2017   HGBA1C 5.4 12/17/2020   BP Readings from Last 3 Encounters:  05/12/21 124/80  04/30/21 128/75   01/31/21 105/67   Lab Results  Component Value Date   IRON 89 04/30/2021   TIBC 339 04/30/2021   FERRITIN 92 04/30/2021    ASSESSMENT AND PLAN:  Discussed the following assessment and plan:  Essential hypertension - controlled  - Plan: CBC with Differential/Platelets, Basic metabolic panel, Iron, TIBC and Ferritin Panel  Iron deficiency - Plan: CBC with Differential/Platelets, Basic metabolic panel, Iron, TIBC and Ferritin Panel  Medication management - Plan: CBC with Differential/Platelets, Basic metabolic panel, Iron, TIBC and Ferritin Panel  Psoriatic arthritis ([place]) - Plan: CBC with Differential/Platelets, Basic metabolic panel, Iron, TIBC and Ferritin Panel  Renal cyst - incidental on mri  assuming benign consider  more defined imagining  to define  benign .  Left foot pain - prob arthritis cause sees podiatry   High risk medication use - Plan: CBC with Differential/Platelets, Basic metabolic panel, Iron, TIBC and Ferritin Panel Lab appt 3-4 mos  ( can add on any needed by dr D   ) to do iron panel ,  cbc diff  esr crp. -  Patient advised to return or notify health care team  if  new concerns arise.  Patient Instructions  Good to see you today .  Would repeat iron studies   in 3-4 months  instead of  6 months .   May  consider  US imaging  to better define renal cyst.  Standley Brooking. Alayza Pieper M.D.

## 2021-05-15 ENCOUNTER — Other Ambulatory Visit: Payer: Self-pay | Admitting: Internal Medicine

## 2021-05-19 ENCOUNTER — Ambulatory Visit: Payer: Medicare Other | Admitting: Internal Medicine

## 2021-05-20 NOTE — Telephone Encounter (Signed)
All refills for Simponi through [place] PAP for Medicare patients must be sent via written prescriber rx form (page 6 of application): [email or web address] ? ?Refills CANNOT be escribed ? ?Knox Saliva, PharmD, MPH, BCPS ?Clinical Pharmacist (Rheumatology and Pulmonology) ?

## 2021-05-20 NOTE — Telephone Encounter (Signed)
Per Yvette at [facility], pt has been approved for PAP program for over a month now. Approval letter was initially faxed to incorrect number, rep states she will correct and refax. Will await delivery of letter. ?

## 2021-05-23 ENCOUNTER — Ambulatory Visit (INDEPENDENT_AMBULATORY_CARE_PROVIDER_SITE_OTHER): Payer: Medicare Other | Admitting: Podiatry

## 2021-05-23 ENCOUNTER — Other Ambulatory Visit: Payer: Self-pay

## 2021-05-23 DIAGNOSIS — M778 Other enthesopathies, not elsewhere classified: Secondary | ICD-10-CM | POA: Diagnosis not present

## 2021-05-23 DIAGNOSIS — G629 Polyneuropathy, unspecified: Secondary | ICD-10-CM | POA: Diagnosis not present

## 2021-05-23 DIAGNOSIS — M19071 Primary osteoarthritis, right ankle and foot: Secondary | ICD-10-CM | POA: Diagnosis not present

## 2021-05-23 MED ORDER — TRIAMCINOLONE ACETONIDE 10 MG/ML IJ SUSP
10.0000 mg | Freq: Once | INTRAMUSCULAR | Status: AC
  Administered 2021-05-23: 10 mg

## 2021-05-23 NOTE — Patient Instructions (Signed)

## 2021-05-26 NOTE — Progress Notes (Signed)
Subjective: ?71 year old female presents the office today for concerns of continued pain to her low foot mostly along the right big toe joint area.  She has also had swelling to the area.  She still describes nerve symptoms and she did see neurology and was told she did not have neuropathy based on nerve conduction test that we could do nerve biopsy to evaluate for small fiber neuropathy.  She does state that at times when her feet get wet she cannot feel it.  Cymbalta was helpful. ? ?Objective: ?AAO x3, NAD ?DP/PT pulses palpable bilaterally, CRT less than 3 seconds ?There is tenderness palpation on the right first MPJ and there is localized edema of this area.  This gets some diffuse discomfort of the foot of her most notably on the first PJ.  There is no erythema or warmth.  Flexor, extensor tendons appear to be intact.  No pain with calf compression, swelling, warmth, erythema ? ?Assessment: ?Capsulitis, arthritis; concern for small fiber neuropathy ? ?Plan: ?-All treatment options discussed with the patient including all alternatives, risks, complications.  ?-We discussed doing an nerve biopsy.  Plan on doing this next week in the office.  It seems that she still has nerve symptoms and the Cymbalta was helpful and when she came off of this her pain worsened.  We will plan on doing a biopsy next week. ?-Steroid injection to the right first MPJ.  Skin was prepped with Betadine, alcohol and mixture of 1 cc Kenalog 10, 0.5 cc of Marcaine plain, 0.5 cc of lidocaine plain was infiltrated into and around the first MPJ without complications.  Postinjection care discussed.  Tolerated well. ?-He was supportive shoe gear. ?-Patient encouraged to call the office with any questions, concerns, change in symptoms.  ? ?Trula Slade DPM ? ?

## 2021-05-27 ENCOUNTER — Telehealth: Payer: Self-pay | Admitting: Pharmacist

## 2021-05-27 NOTE — Telephone Encounter (Signed)
Patient ot stop by clinic on Thursday, 05/29/21 or Friday, 05/30/21 to pick up Simponi autoinjector sample. ? ?Labeled in refrigerator but not logged out in sample inventory binder yet ? ?Knox Saliva, PharmD, MPH, BCPS ?Clinical Pharmacist (Rheumatology and Pulmonology) ?

## 2021-05-28 ENCOUNTER — Ambulatory Visit (INDEPENDENT_AMBULATORY_CARE_PROVIDER_SITE_OTHER): Payer: Medicare Other | Admitting: Podiatry

## 2021-05-28 DIAGNOSIS — G609 Hereditary and idiopathic neuropathy, unspecified: Secondary | ICD-10-CM

## 2021-05-29 NOTE — Telephone Encounter (Signed)
Medication Samples have been provided to the patient. ? ?Drug name: Simpnoi       Strength: 50 mg/0.5 mL        Qty: 1 LOT: [id number]  Exp.Date: 07/2023 ? ?Dosing instructions: Inject one pen into skin once monthly.   ?

## 2021-06-01 NOTE — Progress Notes (Signed)
Subjective: ?71 year old female presents the office today to have the nerve biopsy performed that we discussed last appointment last week.  Since I last saw her no new changes or concerns.  No fevers or chills.  No other concerns. ? ?Objective: ?AAO x3, NAD ?DP/PT pulses palpable bilaterally, CRT less than 3 seconds ?Overall symptoms are unchanged.  There is no ulcerations present.  Minimal edema to the foot.  MMT 5/5. ?No pain with calf compression, swelling, warmth, erythema ? ?Assessment: ?71 year old female presents from her biopsy for concerns of small fiber neuropathy ? ?Plan: ?-All treatment options discussed with the patient including all alternatives, risks, complications.  ?-I discussed with the nerve biopsy as well as the postoperative course.  After discussion she wants to proceed.  We discussed alternatives, risks, complications.  Consent was signed. ?-Area of biopsy was planned approximate 10 cm proximal to the lateral malleolus.  Just proximal to this area I cleaned the skin with alcohol and mixture 1 cc of lidocaine with epinephrine was infiltrated just proximal to the area of the biopsies.  An additional 1 cc of lidocaine, Marcaine plain was infiltrated on the left side to ensure anesthesia as it is not fully anesthetized.  At this time bilaterally the areas were prepped with Betadine.  A 3 mm punch biopsy was then obtained bilaterally and these were properly labeled and placed in the appropriate specimen containers.  I irrigated the wounds with alcohol.  Antibiotic ointment and a bandage applied.  She tolerated procedure well without any complications. ?-Discussed trying to change the bandage tomorrow and I gave her written instructions that were provided by [facility] on how to do this.  ?-Monitor for any clinical signs or symptoms of infection and directed to call the office immediately should any occur or go to the ER. ?-Patient encouraged to call the office with any questions, concerns, change in  symptoms.  ? ?Trula Slade DPM ? ?

## 2021-06-05 ENCOUNTER — Encounter: Payer: Self-pay | Admitting: Podiatry

## 2021-06-09 NOTE — Telephone Encounter (Addendum)
Simponi was sent to [facility] instead of patient's home. Hospital pharmacy team will courier 3 pens to clinic - provided them with address and clinic name ? ?ATC patient to determine if she wanted to pick up if she wanted clinic to keep. ? ?Knox Saliva, PharmD, MPH, BCPS ?Clinical Pharmacist (Rheumatology and Pulmonology) ?

## 2021-06-12 ENCOUNTER — Ambulatory Visit (INDEPENDENT_AMBULATORY_CARE_PROVIDER_SITE_OTHER): Payer: Medicare Other | Admitting: Podiatry

## 2021-06-12 ENCOUNTER — Other Ambulatory Visit: Payer: Self-pay | Admitting: Internal Medicine

## 2021-06-12 DIAGNOSIS — G629 Polyneuropathy, unspecified: Secondary | ICD-10-CM | POA: Diagnosis not present

## 2021-06-15 DIAGNOSIS — G629 Polyneuropathy, unspecified: Secondary | ICD-10-CM | POA: Insufficient documentation

## 2021-06-15 NOTE — Progress Notes (Signed)
Subjective: ?71 year old female presents the office today for follow-up evaluation after undergoing nerve biopsy.  Get some tenderness directly on the procedure sites but it is scabbed over and there is no swelling or redness or drainage. ? ?Objective: ?AAO x3, NAD ?DP/PT pulses palpable bilaterally, CRT less than 3 seconds ?Procedure sites healing well.  Scab is present but there is no edema, erythema or signs of infection noted. ?No pain with calf compression, swelling, warmth, erythema ? ?Assessment: ?Small fiber neuropathy ? ?Plan: ?-All treatment options discussed with the patient including all alternatives, risks, complications.  ?-Procedure sites are healing well.  Continue with antibiotic ointment dressing changes daily.  Can leave the area open at nighttime. ?-Again discussed small fiber neuropathy and she has a follow-up scheduled with neurology for further testing.  The meantime continue Cymbalta. ?-Patient encouraged to call the office with any questions, concerns, change in symptoms.  ? ?Trula Slade DPM ? ?

## 2021-06-16 ENCOUNTER — Other Ambulatory Visit: Payer: Self-pay | Admitting: Rheumatology

## 2021-06-16 ENCOUNTER — Other Ambulatory Visit: Payer: Self-pay | Admitting: Podiatry

## 2021-06-16 NOTE — Telephone Encounter (Signed)
Please advise 

## 2021-06-17 NOTE — Telephone Encounter (Signed)
Next Visit: 09/30/2021 ? ?Last Visit: 04/30/2021 ? ?Last Fill: 01/22/2020 ? ?Dx:  Psoriatic arthritis   ? ?Okay to refill Syringes?   ?

## 2021-07-03 ENCOUNTER — Telehealth: Payer: Self-pay | Admitting: Pharmacist

## 2021-07-03 NOTE — Telephone Encounter (Signed)
Patient picked up her supply of Simponi from [facility] (was originally sent to hospital and then couriered to our clinic) ? ?Knox Saliva, PharmD, MPH, BCPS, CPP ?Clinical Pharmacist (Rheumatology and Pulmonology) ?

## 2021-07-25 ENCOUNTER — Encounter (HOSPITAL_BASED_OUTPATIENT_CLINIC_OR_DEPARTMENT_OTHER): Payer: Self-pay | Admitting: Obstetrics & Gynecology

## 2021-07-25 ENCOUNTER — Ambulatory Visit (INDEPENDENT_AMBULATORY_CARE_PROVIDER_SITE_OTHER): Payer: Medicare Other | Admitting: Obstetrics & Gynecology

## 2021-07-25 ENCOUNTER — Other Ambulatory Visit (HOSPITAL_COMMUNITY)
Admission: RE | Admit: 2021-07-25 | Discharge: 2021-07-25 | Disposition: A | Discharge status: Home / Self Care | Payer: Medicare Other | Source: Ambulatory Visit | Attending: Obstetrics & Gynecology | Admitting: Obstetrics & Gynecology

## 2021-07-25 ENCOUNTER — Ambulatory Visit (HOSPITAL_BASED_OUTPATIENT_CLINIC_OR_DEPARTMENT_OTHER): Payer: Medicare Other | Admitting: Obstetrics & Gynecology

## 2021-07-25 VITALS — BP 130/71 | HR 79 | Wt 196.0 lb

## 2021-07-25 DIAGNOSIS — Z780 Asymptomatic menopausal state: Secondary | ICD-10-CM

## 2021-07-25 DIAGNOSIS — Z01419 Encounter for gynecological examination (general) (routine) without abnormal findings: Secondary | ICD-10-CM | POA: Insufficient documentation

## 2021-07-25 DIAGNOSIS — Z1151 Encounter for screening for human papillomavirus (HPV): Secondary | ICD-10-CM | POA: Diagnosis not present

## 2021-07-25 DIAGNOSIS — M8588 Other specified disorders of bone density and structure, other site: Secondary | ICD-10-CM | POA: Diagnosis not present

## 2021-07-25 DIAGNOSIS — Z9189 Other specified personal risk factors, not elsewhere classified: Secondary | ICD-10-CM

## 2021-07-25 NOTE — Progress Notes (Signed)
71 y.o. G0P0000 Widowed White or Caucasian female here for breast and pelvic exam.  I am also following her for h/o DES in-utero exposure.  Denies vaginal bleeding.  .  Denies vaginal bleeding.  Patient's last menstrual period was 03/02/1984.          Sexually active: No.  H/O STD:  no  Health Maintenance: PCP:  Dr. Regis Bill.  Last wellness appt was 04/2021.  Did blood work at that appt:  yes Vaccines are up to date:  yes Colonoscopy:  11/12/2020 follow up 10 year, done with Dr. [name] MMG:  11/14/2020 BMD:  10/2020 Last pap smear:  07/22/2020.     reports that she has never smoked. She has never used smokeless tobacco. She reports that she does not drink alcohol and does not use drugs.  Past Medical History:  Diagnosis Date   Anemia    iron def   Arthritis    rheumatoid, psoriatic, osteoarthritis    Bone spur    RIGHT FOOT    Broken toe    Bruises easily    Cellulitis    Chronic back pain    COLONIC POLYPS, HX OF 02/09/2007   Cough, persistent 01/21/2012   poss from acei    DES exposure in utero, unknown    Femur fracture, left (HCC)    Fibromyalgia    doesn't require meds   GASTRIC ULCER, ACUTE, HEMORRHAGE, HX OF 02/09/2007   GERD (gastroesophageal reflux disease)    takes Nexium daily   Goiter    H/O hiatal hernia    H/O: hysterectomy    when 71 yrs old   Hx of seasonal allergies    takes Claritin daily   HYPERLIPIDEMIA 12/29/2006   takes Lovastatin nightly   HYPERTENSION 12/29/2006   takes Enalapril daily   IBS 04/28/2007   Insomnia    related to pain;takes Flexeril and Tylenol PM nightly   Joint pain    Joint swelling    Osteopenia    Palpitations 02/09/2007   PONV (postoperative nausea and vomiting)    NAUSEA   REDUCTION MAMMOPLASTY, HX OF 02/09/2007   Right bundle branch block 02/09/2007   S/P lumbar fusion 6 13 09/03/2011   L4 L5  posterior    SYNCOPE 12/07/2007   Tendonitis    RLE    UNS ADVRS EFF OTH RX MEDICINAL\T\BIOLOGICAL SBSTNC 02/09/2007     Past Surgical History:  Procedure Laterality Date   BACK SURGERY  12+yrs ago   Synovial Cyst removal   BACK SURGERY  6/13   lumbar L4-L5 fusion, lamenectomy   bone spur  6-67yr ago   right ankle   BUNIONECTOMY  10/03/14   right foot   CERVICAL FUSION     2 12    CHOLECYSTECTOMY  1996   HARDWARE REMOVAL Left 08/01/2018   Procedure: Left knee hardware removal;  Surgeon: AGaynelle Arabian MD;  Location: [facility];  Service: Orthopedics;  Laterality: Left;  369m   JOINT REPLACEMENT     KNEE ARTHROPLASTY     POSTERIOR CERVICAL LAMINECTOMY Left 07/30/2015   Procedure: Laminectomy and Foraminotomy - left - Cervical two -Cervical three;  Surgeon: HeEarnie LarssonMD;  [facility];  Service: Neurosurgery;  Laterality: Left;   REDUCTION MAMMAPLASTY Bilateral 1989   TONSILLECTOMY     as a child   TOTAL ABDOMINAL HYSTERECTOMY W/ BILATERAL SALPINGOOPHORECTOMY  1986   TOTAL KNEE ARTHROPLASTY N/A 06/21/2017   Procedure: LEFT TOTAL KNEE ARTHROPLASTY AND RIGHT KNEE CORTISONE INJECTION;  Surgeon: Gaynelle Arabian, MD;  Location: [facility];  Service: Orthopedics;  Laterality: N/A;    Current Outpatient Medications  Medication Sig Dispense Refill   acetaminophen (TYLENOL) 650 MG CR tablet Take 1,300 mg by mouth See admin instructions. Take 1300 mg in the morning and may take an additional 1300 mg dose as needed for pain     amLODipine (NORVASC) 5 MG tablet TAKE 1 TABLET BY MOUTH EVERY DAY 90 tablet 0   B-D ALLERGY SYRINGE 1CC/28G 28G X 1/2" 1 ML MISC USE AS DIRECTED ONCE WEEKLY 12 each 3   Calcium Carb-Cholecalciferol (CALCIUM+D3 PO) Take 1 tablet by mouth daily.     diphenhydramine-acetaminophen (TYLENOL PM) 25-500 MG TABS tablet Take 1 tablet by mouth at bedtime.     DULoxetine (CYMBALTA) 30 MG capsule TAKE 1 CAPSULE BY MOUTH EVERY DAY 90 capsule 1   losartan (COZAAR) 100 MG tablet TAKE 1 TABLET BY MOUTH EVERY DAY 90 tablet 0   lovastatin (MEVACOR) 20 MG tablet TAKE 1 TABLET BY MOUTH EVERY DAY IN THE  EVENING 90 tablet 0   Methotrexate Sodium (METHOTREXATE, PF,) 50 MG/2ML injection INJECT 0.3ML INTO THE SKIN ONCE WEEKLY 8 mL 2   Multiple Vitamins-Minerals (MULTIVITAMIN ADULTS PO) Take 1 tablet by mouth daily.      omeprazole (PRILOSEC) 40 MG capsule TAKE 1 CAPSULE BY MOUTH EVERY DAY 90 capsule 0   SIMPONI 50 MG/0.5ML SOAJ INJECT '50MG'$  (1 PEN) UNDER THE SKIN ONCE MONTHLY 1.5 mL ML   Current Facility-Administered Medications  Medication Dose Route Frequency Provider Last Rate Last Admin   dexamethasone (DECADRON) injection 4 mg  4 mg Intra-articular Once Trula Slade, DPM        Family History  Problem Relation Age of Onset   Diabetes Mother    Colon cancer Mother        x2   Arthritis Mother    Hypertension Mother    Endometrial cancer Mother    Stroke Father    Heart disease Father    Colon cancer Father    Arthritis Brother    Hypertension Brother    Hyperlipidemia Brother    Infertility Brother    Diabetes Brother    Other Other        DISH brother   Hypertension Maternal Grandmother    Stroke Maternal Grandmother    Anesthesia problems Neg Hx    Hypotension Neg Hx    Malignant hyperthermia Neg Hx    Pseudochol deficiency Neg Hx     Review of Systems  All other systems reviewed and are negative.  Exam:   BP 130/71   Pulse 79   Wt 196 lb (88.9 kg)   LMP 03/02/1984   BMI 33.64 kg/m      General appearance: alert, cooperative and appears stated age Breasts: normal appearance, no masses or tenderness Abdomen: soft, non-tender; bowel sounds normal; no masses,  no organomegaly Lymph nodes: Cervical, supraclavicular, and axillary nodes normal.  No abnormal inguinal nodes palpated Neurologic: Grossly normal  Pelvic: External genitalia:  no lesions              Urethra:  normal appearing urethra with no masses, tenderness or lesions              Bartholins and Skenes: normal                Vagina: normal appearing vagina with atrophic changes and no  discharge, no lesions  Cervix: absent              Pap taken: Yes.   Bimanual Exam:  Uterus:  uterus absent              Adnexa: no mass, fullness, tenderness               Rectovaginal: Confirms               Anus:  normal sphincter tone, no lesions  Chaperone, Tanzania Staling, CMA, was present for exam.  Assessment/Plan: 1. GYN exam for high-risk Medicare patient - pap smear obtained today - colonoscopy done 11/12/2020 with Dr. [name] - BMD 2022 - mammogram up to date - vaccines reviewed/udpated - lab work done with Dr. Regis Bill  2. Postmenopausal - no HRT  3. DES exposure in utero - yearly exams with pap smears recommended  4. Osteopenia of spine - plan to repeat in 2-3 years

## 2021-07-30 LAB — CYTOLOGY - PAP
Adequacy: ABSENT
Comment: NEGATIVE
Diagnosis: NEGATIVE
Diagnosis: REACTIVE
High risk HPV: NEGATIVE

## 2021-08-11 ENCOUNTER — Other Ambulatory Visit: Payer: Self-pay | Admitting: Internal Medicine

## 2021-08-11 ENCOUNTER — Telehealth: Payer: Self-pay | Admitting: Rheumatology

## 2021-08-11 DIAGNOSIS — Z79899 Other long term (current) drug therapy: Secondary | ICD-10-CM

## 2021-08-11 NOTE — Telephone Encounter (Signed)
Patient called requesting her labwork orders be faxed to [place] at [place].  Patient states she has an appointment scheduled for tomorrow, 08/12/21 with Dr. Regis Bill who told her she would run the labs that Dr. Estanislado Pandy needed.   Fax [phone]

## 2021-08-11 NOTE — Telephone Encounter (Signed)
Lab Orders faxed as requested.  

## 2021-08-12 ENCOUNTER — Other Ambulatory Visit (INDEPENDENT_AMBULATORY_CARE_PROVIDER_SITE_OTHER): Payer: Medicare Other

## 2021-08-12 DIAGNOSIS — Z79899 Other long term (current) drug therapy: Secondary | ICD-10-CM | POA: Diagnosis not present

## 2021-08-12 DIAGNOSIS — E611 Iron deficiency: Secondary | ICD-10-CM

## 2021-08-12 DIAGNOSIS — L4050 Arthropathic psoriasis, unspecified: Secondary | ICD-10-CM | POA: Diagnosis not present

## 2021-08-12 DIAGNOSIS — I10 Essential (primary) hypertension: Secondary | ICD-10-CM

## 2021-08-12 LAB — CBC WITH DIFFERENTIAL/PLATELET
Basophils Absolute: 0.1 10*3/uL (ref 0.0–0.1)
Basophils Relative: 1 % (ref 0.0–3.0)
Eosinophils Absolute: 0.1 10*3/uL (ref 0.0–0.7)
Eosinophils Relative: 1.5 % (ref 0.0–5.0)
HCT: 40 % (ref 36.0–46.0)
Hemoglobin: 13.7 g/dL (ref 12.0–15.0)
Lymphocytes Relative: 29.8 % (ref 12.0–46.0)
Lymphs Abs: 2 10*3/uL (ref 0.7–4.0)
MCHC: 34.2 g/dL (ref 30.0–36.0)
MCV: 96.3 fl (ref 78.0–100.0)
Monocytes Absolute: 0.4 10*3/uL (ref 0.1–1.0)
Monocytes Relative: 5.7 % (ref 3.0–12.0)
Neutro Abs: 4.1 10*3/uL (ref 1.4–7.7)
Neutrophils Relative %: 62 % (ref 43.0–77.0)
Platelets: 326 10*3/uL (ref 150.0–400.0)
RBC: 4.16 Mil/uL (ref 3.87–5.11)
RDW: 13.3 % (ref 11.5–15.5)
WBC: 6.6 10*3/uL (ref 4.0–10.5)

## 2021-08-12 LAB — BASIC METABOLIC PANEL
BUN: 25 mg/dL — ABNORMAL HIGH (ref 6–23)
CO2: 28 mEq/L (ref 19–32)
Calcium: 9.8 mg/dL (ref 8.4–10.5)
Chloride: 101 mEq/L (ref 96–112)
Creatinine, Ser: 0.8 mg/dL (ref 0.40–1.20)
GFR: 75.29 mL/min (ref >60.00)
Glucose, Bld: 110 mg/dL — ABNORMAL HIGH (ref 70–99)
Potassium: 4.1 mEq/L (ref 3.5–5.1)
Sodium: 137 mEq/L (ref 135–145)

## 2021-08-13 LAB — IRON,TIBC AND FERRITIN PANEL
%SAT: 22 % (calc) (ref 16–45)
Ferritin: 54 ng/mL (ref 16–288)
Iron: 80 ug/dL (ref 45–160)
TIBC: 370 mcg/dL (calc) (ref 250–450)

## 2021-08-13 NOTE — Progress Notes (Signed)
Blood count and iron saturation levels are in range  forwarding results to Dr D

## 2021-08-18 NOTE — Progress Notes (Signed)
Would opine to dr Irene Limbo as to intervention to get the "ferritin to above 100"

## 2021-08-22 ENCOUNTER — Encounter: Payer: Self-pay | Admitting: Internal Medicine

## 2021-08-26 ENCOUNTER — Other Ambulatory Visit: Payer: Self-pay | Admitting: Hematology

## 2021-08-26 ENCOUNTER — Inpatient Hospital Stay: Payer: Medicare Other | Attending: Hematology | Admitting: Hematology

## 2021-08-26 DIAGNOSIS — Z79899 Other long term (current) drug therapy: Secondary | ICD-10-CM | POA: Insufficient documentation

## 2021-08-26 DIAGNOSIS — D509 Iron deficiency anemia, unspecified: Secondary | ICD-10-CM | POA: Diagnosis not present

## 2021-08-26 DIAGNOSIS — M069 Rheumatoid arthritis, unspecified: Secondary | ICD-10-CM | POA: Insufficient documentation

## 2021-09-02 ENCOUNTER — Encounter: Payer: Self-pay | Admitting: Hematology

## 2021-09-02 NOTE — Progress Notes (Signed)
Marland Kitchen    HEMATOLOGY/ONCOLOGY CLINIC NOTE  Date of Service:  .08/26/2021   Patient Care Team: [name], MD as PCP - General Richmond Campbell, MD (Gastroenterology) Megan Salon, MD (Gynecology) Bo Merino, MD as Consulting Physician (Rheumatology) Brunetta Genera, MD as Consulting Physician (Hematology) Gaynelle Arabian, MD as Consulting Physician (Orthopedic Surgery) Alda Berthold, DO as Consulting Physician (Neurology)  CHIEF COMPLAINTS/PURPOSE OF CONSULTATION: f/u for Iron deficiency anemia  Diagnosis severe iron deficiency anemia + ACD (due to psoariasis) Oral iron intolerance  Treatment IV Iron as needed (received IV feraheme 08/29/15, 08/22/2015, 06/04/2015, 05/28/2015)   INTERVAL HISTORY  ..I connected with MIKHIA DUSEK on 08/26/2021 at  3:10 PM EDT by telephone visit and verified that I am speaking with the correct person using two identifiers.   I discussed the limitations, risks, security and privacy concerns of performing an evaluation and management service by telemedicine and the availability of in-person appointments. I also discussed with the patient that there may be a patient responsible charge related to this service. The patient expressed understanding and agreed to proceed.   Other persons participating in the visit and their role in the encounter: None  Patient's location: Home Provider's location: [place]  Chief Complaint: Follow-up for management of iron deficiency anemia  Patient recently had labs with primary care physician on 08/12/2021 which showed hemoglobin of 13.7 WBC count of 6.6k and platelets of 326k CMP within normal limits Ferritin is down to 54 with an iron saturation of 22%. Patient notes some increased fatigue and is keen to replace iron IV as previously.  Continues to follow with rheumatology for management of her rheumatoid arthritis.     MEDICAL HISTORY:  Past Medical History:  Diagnosis Date    Anemia    iron def   Arthritis    rheumatoid, psoriatic, osteoarthritis    Bone spur    RIGHT FOOT    Broken toe    Bruises easily    Cellulitis    Chronic back pain    COLONIC POLYPS, HX OF 02/09/2007   Cough, persistent 01/21/2012   poss from acei    DES exposure in utero, unknown    Femur fracture, left (HCC)    Fibromyalgia    doesn't require meds   GASTRIC ULCER, ACUTE, HEMORRHAGE, HX OF 02/09/2007   GERD (gastroesophageal reflux disease)    takes Nexium daily   Goiter    H/O hiatal hernia    H/O: hysterectomy    when 71 yrs old   Hx of seasonal allergies    takes Claritin daily   HYPERLIPIDEMIA 12/29/2006   takes Lovastatin nightly   HYPERTENSION 12/29/2006   takes Enalapril daily   IBS 04/28/2007   Insomnia    related to pain;takes Flexeril and Tylenol PM nightly   Joint pain    Joint swelling    Osteopenia    Palpitations 02/09/2007   PONV (postoperative nausea and vomiting)    NAUSEA   REDUCTION MAMMOPLASTY, HX OF 02/09/2007   Right bundle branch block 02/09/2007   S/P lumbar fusion 6 13 09/03/2011   L4 L5  posterior    SYNCOPE 12/07/2007   Tendonitis    RLE    UNS ADVRS EFF OTH RX MEDICINAL\T\BIOLOGICAL SBSTNC 02/09/2007    SURGICAL HISTORY: Past Surgical History:  Procedure Laterality Date   BACK SURGERY  12+yrs ago   Synovial Cyst removal   BACK SURGERY  6/13   lumbar L4-L5 fusion, lamenectomy  bone spur  6-78yr ago   right ankle   BUNIONECTOMY  10/03/14   right foot   CERVICAL FUSION     2 12    CHOLECYSTECTOMY  1996   HARDWARE REMOVAL Left 08/01/2018   Procedure: Left knee hardware removal;  Surgeon: AGaynelle Arabian MD;  Location: [facility];  Service: Orthopedics;  Laterality: Left;  345m   JOINT REPLACEMENT     KNEE ARTHROPLASTY     POSTERIOR CERVICAL LAMINECTOMY Left 07/30/2015   Procedure: Laminectomy and Foraminotomy - left - Cervical two -Cervical three;  Surgeon: HeEarnie LarssonMD;  [facility];  Service: Neurosurgery;   Laterality: Left;   REDUCTION MAMMAPLASTY Bilateral 1989   TONSILLECTOMY     as a child   TOTAL ABDOMINAL HYSTERECTOMY W/ BILATERAL SALPINGOOPHORECTOMY  1986   TOTAL KNEE ARTHROPLASTY N/A 06/21/2017   Procedure: LEFT TOTAL KNEE ARTHROPLASTY AND RIGHT KNEE CORTISONE INJECTION;  Surgeon: AlGaynelle ArabianMD;  Location: [facility];  Service: Orthopedics;  Laterality: N/A;    SOCIAL HISTORY: Social History   Socioeconomic History   Marital status: Widowed    Spouse name: Not on file   Number of children: 0   Years of education: Not on file   Highest education level: Not on file  Occupational History   Not on file  Tobacco Use   Smoking status: Never   Smokeless tobacco: Never  Vaping Use   Vaping Use: Never used  Substance and Sexual Activity   Alcohol use: No    Alcohol/week: 0.0 standard drinks of alcohol   Drug use: No   Sexual activity: Not Currently    Partners: Male    Birth control/protection: Surgical    Comment: TAH/BSO  Other Topics Concern   Not on file  Social History Narrative   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  Husband died suddenly 2010Non smoker         Left handed    Lives in a one story home       Social Determinants of Health   Financial Resource Strain: Low Risk  (04/16/2021)   Overall Financial Resource Strain (CARDIA)    Difficulty of Paying Living Expenses: Not hard at all  Food Insecurity: No FoVantage2/15/2023)   Hunger Vital Sign    Worried About Running Out of Food in the Last Year: Never true    RaBadgern the Last Year: Never true  Transportation Needs: No Transportation Needs (04/16/2021)   PRAPARE - TrHydrologistMedical): No    Lack of Transportation (Non-Medical): No  Physical Activity: Sufficiently Active (04/16/2021)   Exercise Vital Sign    Days of Exercise per Week: 7 days    Minutes of Exercise per Session: 60 min  Stress: Stress  Concern Present (04/16/2021)   [place]  Feeling of Stress : To some extent  Social Connections: Moderately Integrated (04/16/2021)   Social Connection and Isolation Panel [NHANES]    Frequency of Communication with Friends and Family: More than three times a week    Frequency of Social Gatherings with Friends and Family: More than three times a week    Attends Religious Services: More than 4 times per year    Active Member of [employer] Organizations: Yes    Attends ClArchivisteetings: More than 4 times per year  Marital Status: Widowed  Intimate Partner Violence: Not At Risk (04/16/2021)   Humiliation, Afraid, Rape, and Kick questionnaire    Fear of Current or Ex-Partner: No    Emotionally Abused: No    Physically Abused: No    Sexually Abused: No    FAMILY HISTORY: Family History  Problem Relation Age of Onset   Diabetes Mother    Colon cancer Mother        x2   Arthritis Mother    Hypertension Mother    Endometrial cancer Mother    Stroke Father    Heart disease Father    Colon cancer Father    Arthritis Brother    Hypertension Brother    Hyperlipidemia Brother    Infertility Brother    Diabetes Brother    Other Other        DISH brother   Hypertension Maternal Grandmother    Stroke Maternal Grandmother    Anesthesia problems Neg Hx    Hypotension Neg Hx    Malignant hyperthermia Neg Hx    Pseudochol deficiency Neg Hx     ALLERGIES:  is allergic to erythromycin, iodinated contrast media, ace inhibitors, indomethacin, keflex [cephalexin], and macrobid [nitrofurantoin].  MEDICATIONS:  Current Outpatient Medications  Medication Sig Dispense Refill   acetaminophen (TYLENOL) 650 MG CR tablet Take 1,300 mg by mouth See admin instructions. Take 1300 mg in the morning and may take an additional 1300 mg dose as needed for pain     amLODipine (NORVASC) 5 MG tablet TAKE 1 TABLET BY MOUTH EVERY  DAY 90 tablet 0   B-D ALLERGY SYRINGE 1CC/28G 28G X 1/2" 1 ML MISC USE AS DIRECTED ONCE WEEKLY 12 each 3   Calcium Carb-Cholecalciferol (CALCIUM+D3 PO) Take 1 tablet by mouth daily.     diphenhydramine-acetaminophen (TYLENOL PM) 25-500 MG TABS tablet Take 1 tablet by mouth at bedtime.     DULoxetine (CYMBALTA) 30 MG capsule TAKE 1 CAPSULE BY MOUTH EVERY DAY 90 capsule 1   losartan (COZAAR) 100 MG tablet TAKE 1 TABLET BY MOUTH EVERY DAY 90 tablet 0   lovastatin (MEVACOR) 20 MG tablet TAKE 1 TABLET BY MOUTH EVERY DAY IN THE EVENING 90 tablet 0   Methotrexate Sodium (METHOTREXATE, PF,) 50 MG/2ML injection INJECT 0.3ML INTO THE SKIN ONCE WEEKLY 8 mL 2   Multiple Vitamins-Minerals (MULTIVITAMIN ADULTS PO) Take 1 tablet by mouth daily.      omeprazole (PRILOSEC) 40 MG capsule TAKE 1 CAPSULE BY MOUTH EVERY DAY 90 capsule 0   SIMPONI 50 MG/0.5ML SOAJ INJECT '50MG'$  (1 PEN) UNDER THE SKIN ONCE MONTHLY 1.5 mL ML   Current Facility-Administered Medications  Medication Dose Route Frequency Provider Last Rate Last Admin   dexamethasone (DECADRON) injection 4 mg  4 mg Intra-articular Once Trula Slade, DPM        REVIEW OF SYSTEMS:   10 Point review of Systems was done is negative except as noted above.  PHYSICAL EXAMINATION: Telemedicine visit  LABORATORY DATA:  I have reviewed the data as listed  .    Latest Ref Rng & Units 08/12/2021    9:11 AM 04/30/2021   10:33 AM 01/29/2021   10:35 AM  CBC  WBC 4.0 - 10.5 K/uL 6.6  5.7  9.9   Hemoglobin 12.0 - 15.0 g/dL 13.7  14.3  13.9   Hematocrit 36.0 - 46.0 % 40.0  42.5  40.4   Platelets 150.0 - 400.0 K/uL 326.0  329  355    . CBC  Component Value Date/Time   WBC 6.6 08/12/2021 0911   RBC 4.16 08/12/2021 0911   HGB 13.7 08/12/2021 0911   HGB 13.8 07/25/2020 1411   HGB 14.0 07/24/2019 1425   HGB 13.2 10/28/2016 1029   HCT 40.0 08/12/2021 0911   HCT 40.4 07/24/2019 1425   HCT 37.5 10/28/2016 1029   PLT 326.0 08/12/2021 0911   PLT 291  07/25/2020 1411   PLT 367 07/24/2019 1425   MCV 96.3 08/12/2021 0911   MCV 95 07/24/2019 1425   MCV 99.5 10/28/2016 1029   MCH 32.4 04/30/2021 1033   MCHC 34.2 08/12/2021 0911   RDW 13.3 08/12/2021 0911   RDW 14.3 07/24/2019 1425   RDW 12.7 10/28/2016 1029   LYMPHSABS 2.0 08/12/2021 0911   LYMPHSABS 2.3 07/24/2019 1425   LYMPHSABS 1.9 10/28/2016 1029   MONOABS 0.4 08/12/2021 0911   MONOABS 0.3 10/28/2016 1029   EOSABS 0.1 08/12/2021 0911   EOSABS 0.1 07/24/2019 1425   BASOSABS 0.1 08/12/2021 0911   BASOSABS 0.1 07/24/2019 1425   BASOSABS 0.0 10/28/2016 1029    . Lab Results  Component Value Date   RETICCTPCT 2.27 (H) 10/28/2016   RBC 4.16 08/12/2021   RETICCTABS 85.58 10/28/2016    .    Latest Ref Rng & Units 08/12/2021    9:11 AM 04/30/2021   10:33 AM 02/04/2021   10:58 AM  CMP  Glucose 70 - 99 mg/dL 110  98  99   BUN 6 - 23 mg/dL '25  22  19   '$ Creatinine 0.40 - 1.20 mg/dL 0.80  0.82  0.77   Sodium 135 - 145 mEq/L 137  140  140   Potassium 3.5 - 5.1 mEq/L 4.1  4.9  4.4   Chloride 96 - 112 mEq/L 101  103  101   CO2 19 - 32 mEq/L '28  28  29   '$ Calcium 8.4 - 10.5 mg/dL 9.8  9.9  9.3   Total Protein 6.1 - 8.1 g/dL  7.7  7.0   Total Bilirubin 0.2 - 1.2 mg/dL  0.7  0.7   AST 10 - 35 U/L  16  16   ALT 6 - 29 U/L  16  13     Lab Results  Component Value Date   FERRITIN 54 08/12/2021    RADIOGRAPHIC STUDIES: I have personally reviewed the radiological images as listed and agreed with the findings in the report. No results found.  ASSESSMENT & PLAN:   Ms. Rabbani is a pleasant 71 year old female with  #1 Severe microcytic hypochromic anemia  Due to iron deficiency. Patient's hemoglobin has responded very well to IV iron with improvement in her hemoglobin from 7.2 to 12.4 and now is stable at 13.2 with nl MCV . Ferritin levels were elevated to 800's post IV Iron and likely as an acute phase reactant.  #2 severe iron deficiency likely due to chronic GI bleeding +  poor oral iron absorption. She has a EGD and colonoscopy in 2013.  Previous history of gastric ulcer requiring blood transfusions.  Also send gastric polyps and a large hernia and prone to developing Cameron ulcers, previous colonic polyps and previous bleeding hemorrhoids.  Patient reports having recent EGD and colonoscopy with [name] in April 2017 which showed a large gastric ulcer. She reports that it was noted to be benign and Helicobacter pylori negative. The reports of the studies and the pathology are not available to [place] at this time. Patient notes that she has  had f/u EGD which showed that the GU has healed - EGD read not available to [place] currently.  #3 history of poor tolerance to oral iron with significant GI distress.  She is also on chronic PPI therapy that puts her at higher risk of developing iron deficiency due to poor absorption.  And also B12 deficiency and possible vitamin D deficiency.   PLAN: Discussed lab work as noted above Patient's iron stores have been progressively dropping and ferritin is down to 54 with an iron saturation of 22%. Her goals with rheumatoid arthritis and previous history of GI losses has been to keep her ferritin more than 100 We will schedule her for 1 additional dose of IV Feraheme to build up her stools and keep her ferritin more than 100 She will continue to monitor labs with her primary care physician and call us as needed for additional IV iron  FOLLOW UP: note: IV feraheme ('510mg'$ ) IVPB x 1 dose. (Patient prefers to have the infusion at [facility] if possible since she lives close by. If not possible she is okay with coming to [place] long)   The total time spent in the appointment was 10 minutes*.  All of the patient's questions were answered with apparent satisfaction. The patient knows to call the clinic with any problems, questions or concerns.   Sullivan Lone MD MS AAHIVMS [facility] Hematology/Oncology Physician [facility]  .*Total Encounter Time as defined by the Centers for Medicare and Medicaid Services includes, in addition to the face-to-face time of a patient visit (documented in the note above) non-face-to-face time: obtaining and reviewing outside history, ordering and reviewing medications, tests or procedures, care coordination (communications with other health care professionals or caregivers) and documentation in the medical record.

## 2021-09-04 ENCOUNTER — Inpatient Hospital Stay: Payer: Medicare Other | Attending: Hematology

## 2021-09-04 VITALS — BP 138/72 | HR 67 | Temp 97.7°F | Resp 18 | Ht 65.0 in | Wt 201.6 lb

## 2021-09-04 DIAGNOSIS — Z79899 Other long term (current) drug therapy: Secondary | ICD-10-CM | POA: Insufficient documentation

## 2021-09-04 DIAGNOSIS — E611 Iron deficiency: Secondary | ICD-10-CM

## 2021-09-04 DIAGNOSIS — D509 Iron deficiency anemia, unspecified: Secondary | ICD-10-CM | POA: Insufficient documentation

## 2021-09-04 MED ORDER — ACETAMINOPHEN 325 MG PO TABS
650.0000 mg | ORAL_TABLET | Freq: Once | ORAL | Status: AC
  Administered 2021-09-04: 650 mg via ORAL
  Filled 2021-09-04: qty 2

## 2021-09-04 MED ORDER — LORATADINE 10 MG PO TABS
10.0000 mg | ORAL_TABLET | Freq: Every day | ORAL | Status: DC
  Administered 2021-09-04: 10 mg via ORAL
  Filled 2021-09-04: qty 1

## 2021-09-04 MED ORDER — SODIUM CHLORIDE 0.9 % IV SOLN
510.0000 mg | Freq: Once | INTRAVENOUS | Status: AC
  Administered 2021-09-04: 510 mg via INTRAVENOUS
  Filled 2021-09-04: qty 17

## 2021-09-04 MED ORDER — SODIUM CHLORIDE 0.9 % IV SOLN: Freq: Once | INTRAVENOUS | Status: AC

## 2021-09-04 NOTE — Patient Instructions (Signed)

## 2021-09-12 ENCOUNTER — Other Ambulatory Visit: Payer: Self-pay | Admitting: Internal Medicine

## 2021-09-16 NOTE — Progress Notes (Signed)
Office Visit Note  Patient: Jade Cardenas             Date of Birth: August 10, 1950           MRN: [id number]             PCP: [name], MD Referring: [name], MD Visit Date: 09/30/2021 Occupation: '@GUAROCC'$ @  Subjective:  Pain in neck  History of Present Illness: Jade Cardenas is a 71 y.o. female with history of psoriatic arthritis, psoriasis, osteoarthritis and degenerative disc disease.  She states has been having increased pain and discomfort in her cervical spine.  She states the pain radiates into her head.  She continues to have some discomfort in her knee joints.  She has not noticed any joint swelling.  She has a stiffness in her hands and her feet.  Activities of Daily Living:  Patient reports morning stiffness for 2-3 hour.   Patient Reports nocturnal pain.  Difficulty dressing/grooming: Denies Difficulty climbing stairs: Reports Difficulty getting out of chair: Reports Difficulty using hands for taps, buttons, cutlery, and/or writing: Reports  Review of Systems  Constitutional:  Positive for fatigue.  HENT:  Positive for mouth dryness. Negative for mouth sores.   Eyes:  Positive for dryness.  Respiratory:  Negative for shortness of breath.   Cardiovascular:  Negative for chest pain and palpitations.  Gastrointestinal:  Negative for blood in stool, constipation and diarrhea.  Endocrine: Negative for increased urination.  Genitourinary:  Negative for involuntary urination.  Musculoskeletal:  Positive for joint pain, joint pain, joint swelling, myalgias, muscle weakness, morning stiffness, muscle tenderness and myalgias.  Skin:  Negative for color change, rash, hair loss and sensitivity to sunlight.  Allergic/Immunologic: Negative for susceptible to infections.  Neurological:  Positive for headaches. Negative for dizziness.  Hematological:  Negative for swollen glands.  Psychiatric/Behavioral:  Positive for sleep disturbance. Negative for depressed  mood. The patient is not nervous/anxious.     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   Fibromyalgia 05/17/2016   DJD (degenerative joint disease), cervical 02/18/2016   Heel spur 09/29/2015   Ingrown toenail 09/29/2015   Foraminal stenosis of cervical region 07/30/2015   Microcytic hypochromic anemia 05/22/2015   Tailor's bunion, left 01/18/2015   H/O hiatal hernia    Bilateral primary osteoarthritis of knee 12/15/2013   Iron deficiency 12/15/2013   Psoriatic arthritis ([place]) 12/15/2013   History of foot fracture 12/15/2013   Dermatitis 10/17/2013   Hx of diethylstilbestrol (DES) exposure in utero unknown 12/09/2012   Tinnitus 06/23/2012   Scalp lesion 01/21/2012   Headache(784.0) 01/21/2012   Hx of psoriasis 01/21/2012   Weight gain 01/21/2012   Ankylosing spondylitis  possible 09/03/2011   Hx of transfusion 09/03/2011   S/P lumbar fusion 6 13 09/03/2011   Lumbar stenosis with neurogenic claudication 08/05/2011   ALLERGIC RHINITIS 02/19/2010   GERD 02/19/2010   SPINAL STENOSIS, CERVICAL 02/19/2010   OSTEOPENIA 02/19/2010   IBS 04/28/2007   History of gastric ulcer 02/09/2007   COLONIC POLYPS, HX OF 02/09/2007   CHOLECYSTECTOMY, HX OF 02/09/2007   Hyperlipidemia 12/29/2006  Essential hypertension 12/29/2006    Past Medical History:  Diagnosis Date   Anemia    iron def   Arthritis    rheumatoid, psoriatic, osteoarthritis    Bone spur    RIGHT FOOT    Broken toe    Bruises easily    Cellulitis    Chronic back pain    COLONIC POLYPS, HX OF 02/09/2007    Cough, persistent 01/21/2012   poss from acei    DES exposure in utero, unknown    Femur fracture, left (HCC)    Fibromyalgia    doesn't require meds   GASTRIC ULCER, ACUTE, HEMORRHAGE, HX OF 02/09/2007   GERD (gastroesophageal reflux disease)    takes Nexium daily   Goiter    H/O hiatal hernia    H/O: hysterectomy    when 71 yrs old   Hx of seasonal allergies    takes Claritin daily   HYPERLIPIDEMIA 12/29/2006   takes Lovastatin nightly   HYPERTENSION 12/29/2006   takes Enalapril daily   IBS 04/28/2007   Insomnia    related to pain;takes Flexeril and Tylenol PM nightly   Joint pain    Joint swelling    Neuropathy    small cell   Osteopenia    Palpitations 02/09/2007   PONV (postoperative nausea and vomiting)    NAUSEA   REDUCTION MAMMOPLASTY, HX OF 02/09/2007   Right bundle branch block 02/09/2007   S/P lumbar fusion 6 13 09/03/2011   L4 L5  posterior    SYNCOPE 12/07/2007   Tendonitis    RLE    UNS ADVRS EFF OTH RX MEDICINAL\T\BIOLOGICAL SBSTNC 02/09/2007    Family History  Problem Relation Age of Onset   Diabetes Mother    Colon cancer Mother        x2   Arthritis Mother    Hypertension Mother    Endometrial cancer Mother    Stroke Father    Heart disease Father    Colon cancer Father    Arthritis Brother    Hypertension Brother    Hyperlipidemia Brother    Infertility Brother    Diabetes Brother    Other Other        DISH brother   Hypertension Maternal Grandmother    Stroke Maternal Grandmother    Anesthesia problems Neg Hx    Hypotension Neg Hx    Malignant hyperthermia Neg Hx    Pseudochol deficiency Neg Hx    Past Surgical History:  Procedure Laterality Date   BACK SURGERY  12+yrs ago   Synovial Cyst removal   BACK SURGERY  6/13   lumbar L4-L5 fusion, lamenectomy   bone spur  6-80yr ago   right ankle   BUNIONECTOMY  10/03/14   right foot   CERVICAL FUSION     2 12    CHOLECYSTECTOMY  1996   HARDWARE REMOVAL Left 08/01/2018   Procedure:  Left knee hardware removal;  Surgeon: AGaynelle Arabian MD;  Location: [facility];  Service: Orthopedics;  Laterality: Left;  34m   JOINT REPLACEMENT     KNEE ARTHROPLASTY     POSTERIOR CERVICAL LAMINECTOMY Left 07/30/2015   Procedure: Laminectomy and Foraminotomy - left - Cervical two -Cervical three;  Surgeon: HeEarnie LarssonMD;  [facility];  Service: Neurosurgery;  Laterality: Left;   REDUCTION MAMMAPLASTY Bilateral 1989   TONSILLECTOMY     as a child   TOTAL ABDOMINAL HYSTERECTOMY W/ BILATERAL SALPINGOOPHORECTOMY  1986   TOTAL KNEE ARTHROPLASTY N/A  06/21/2017   Procedure: LEFT TOTAL KNEE ARTHROPLASTY AND RIGHT KNEE CORTISONE INJECTION;  Surgeon: Gaynelle Arabian, MD;  Location: [facility];  Service: Orthopedics;  Laterality: N/A;   Social History   Social History Narrative   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  Husband died suddenly 2010Non smoker         Left handed    Lives in a one story home       Immunization History  Administered Date(s) Administered   Fluad Quad(high Dose 65+) 11/04/2018, 11/07/2019, 11/21/2020   Influenza Split 11/29/2012, 11/27/2013   Influenza Whole 11/09/2008   Influenza, High Dose Seasonal PF 11/26/2015, 11/30/2016, 11/03/2017, 12/05/2020   Influenza,inj,Quad PF,6+ Mos 11/27/2014   Influenza-Unspecified 12/22/2016   PFIZER Comirnaty(Gray Top)Covid-19 Tri-Sucrose Vaccine 03/28/2020   PFIZER(Purple Top)SARS-COV-2 Vaccination 04/08/2019, 05/03/2019, 10/19/2019, 03/28/2020   PPD Test 12/18/2013, 11/27/2014, 11/26/2015, 11/30/2016   Pfizer Covid-19 Vaccine Bivalent Booster 64yr & up 11/21/2020   Pneumococcal Conjugate-13 02/28/2013   Pneumococcal Polysaccharide-23 03/03/2003, 02/28/2014, 11/07/2019   Td 05/09/2008   Tdap 07/19/2018   Zoster Recombinat (Shingrix) 12/03/2016, 02/04/2017   Zoster, Live 01/20/2011     Objective: Vital Signs: BP (!) 143/81 (BP Location: Left Arm, Patient  Position: Sitting, Cuff Size: Normal)   Pulse 76   Resp 15   Ht '5\' 5"'$  (1.651 m)   Wt 203 lb 9.6 oz (92.4 kg)   LMP 03/02/1984   BMI 33.88 kg/m    Physical Exam Vitals and nursing note reviewed.  Constitutional:      Appearance: She is well-developed.  HENT:     Head: Normocephalic and atraumatic.  Eyes:     Conjunctiva/sclera: Conjunctivae normal.  Cardiovascular:     Rate and Rhythm: Normal rate and regular rhythm.     Heart sounds: Normal heart sounds.  Pulmonary:     Effort: Pulmonary effort is normal.     Breath sounds: Normal breath sounds.  Abdominal:     General: Bowel sounds are normal.     Palpations: Abdomen is soft.  Musculoskeletal:     Cervical back: Normal range of motion.  Lymphadenopathy:     Cervical: No cervical adenopathy.  Skin:    General: Skin is warm and dry.     Capillary Refill: Capillary refill takes less than 2 seconds.  Neurological:     Mental Status: She is alert and oriented to person, place, and time.  Psychiatric:        Behavior: Behavior normal.      Musculoskeletal Exam: She had limited lateral rotation of her cervical spine especially to the right side.  There was thoracic kyphosis.  There was no point tenderness over thoracic or lumbar spine.  Shoulder joints, elbow joints, wrist joints with good range of motion.  She had bilateral CMC PIP and DIP thickening with no synovitis.  Hip joints and knee joints with good range of motion.  She had bilateral hammertoes.  No synovitis was noted.  CDAI Exam: CDAI Score: -- Patient Global: --; Provider Global: -- Swollen: --; Tender: -- Joint Exam 09/30/2021   No joint exam has been documented for this visit   There is currently no information documented on the homunculus. Go to the Rheumatology activity and complete the homunculus joint exam.  Investigation: No additional findings.  Imaging: No results found.  Recent Labs: Lab Results  Component Value Date   WBC 6.6 08/12/2021    HGB 13.7 08/12/2021   PLT 326.0 08/12/2021  NA 137 08/12/2021   K 4.1 08/12/2021   CL 101 08/12/2021   CO2 28 08/12/2021   GLUCOSE 110 (H) 08/12/2021   BUN 25 (H) 08/12/2021   CREATININE 0.80 08/12/2021   BILITOT 0.7 04/30/2021   ALKPHOS 102 07/25/2020   AST 16 04/30/2021   ALT 16 04/30/2021   PROT 7.7 04/30/2021   ALBUMIN 4.1 07/25/2020   CALCIUM 9.8 08/12/2021   GFRAA 87 02/02/2020   QFTBGOLDPLUS NEGATIVE 10/30/2020    Speciality Comments: Failed therapy: Arava and Otezla due to diarrhea Enbrel, Humira-neuropathy  Procedures:  No procedures performed Allergies: Erythromycin, Iodinated contrast media, Ace inhibitors, Indomethacin, Keflex [cephalexin], and Macrobid [nitrofurantoin]   Assessment / Plan:     Visit Diagnoses: Psoriatic arthritis (HCC)-she had no synovitis on my examination.  She has been tolerating Simponi 50 mg subcu every 28 days along with methotrexate 0.3 mL subcu weekly.  She denies any side effects from the medications.  She continues to have some stiffness due to underlying osteoarthritis.  Hx of psoriasis-she had no active psoriasis patches.  High risk medication use - Simponi 50 mg every 28 days and methotrexate 0.3 ml every 7 days folic acid 1 mg p.o. daily..([place] caused diarrhea). -Labs obtained on April 30, 2021 were reviewed which were within normal limits.  Sed rate was elevated at 55 but better than before.  Plan: CBC with Differential/Platelet, COMPLETE METABOLIC PANEL WITH GFR today and every 3 months.  TB gold was negative on October 30, 2020.  She will come in for repeat TB Gold.  Information regarding immunization was placed in the AVS.  She was advised to hold Simponi and methotrexate if she develops an infection and resume it after the infection resolves.  Annual skin examination to screen for skin cancer was advised.  Primary osteoarthritis of both hands-she had bilateral PIP and DIP thickening and CMC prominence without any  synovitis.  Primary osteoarthritis of right knee -she continues to have some discomfort in her right knee.  Moderate osteoarthritis and severe chondromalacia patella.  Total knee replacement status, left - by Dr. Wynelle Link.  Primary osteoarthritis of both feet-she denies any discomfort in her feet.  She has osteoarthritic changes.  DDD (degenerative disc disease), cervical - S/P fusion x2: She has been experiencing increased pain and discomfort in her cervical spine.  I advised her to schedule an appointment with the neurosurgeon.  DDD (degenerative disc disease), lumbar - S/P fusion.  She had a recent MRI of the lumbar spine on 02/26/2021   Small fiber neuropathy-patient states that she was seen by the neurologist and also by Dr. Jacqualyn Posey she was diagnosed with a small fiber neuropathy and is on Cymbalta.  Fibromyalgia - Cymbalta 30 mg 1 capsule daily.   Chronic fatigue  Osteopenia of multiple sites - Followed by her GYN.  Updated DXA on 11/14/20: The BMD measured at [facility] Right is 0.829 g/cm2 with a T-score of -1.5.  Use of calcium rich diet and vitamin D was discussed.  Other iron deficiency anemia -patient states that she had recent iron infusion.  Per her request I will obtain additional labs.  She has been under care of Dr. Irene Limbo.  Plan: Iron, TIBC and Ferritin Panel  Tick bite of abdomen, subsequent encounter  Orders: Orders Placed This Encounter  Procedures   CBC with Differential/Platelet   COMPLETE METABOLIC PANEL WITH GFR   Iron, TIBC and Ferritin Panel   No orders of the defined types were placed in this  encounter.    Follow-Up Instructions: Return in about 5 months (around 03/02/2022) for Psoriatic arthritis.   Bo Merino, MD  Note - This record has been created using Editor, commissioning.  Chart creation errors have been sought, but may not always  have been located. Such creation errors do not reflect on  the standard of medical care.

## 2021-09-30 ENCOUNTER — Encounter: Payer: Self-pay | Admitting: Rheumatology

## 2021-09-30 ENCOUNTER — Ambulatory Visit: Payer: Medicare Other | Attending: Rheumatology | Admitting: Rheumatology

## 2021-09-30 VITALS — BP 143/81 | HR 76 | Resp 15 | Ht 65.0 in | Wt 203.6 lb

## 2021-09-30 DIAGNOSIS — M19041 Primary osteoarthritis, right hand: Secondary | ICD-10-CM | POA: Diagnosis not present

## 2021-09-30 DIAGNOSIS — Z79899 Other long term (current) drug therapy: Secondary | ICD-10-CM | POA: Insufficient documentation

## 2021-09-30 DIAGNOSIS — D508 Other iron deficiency anemias: Secondary | ICD-10-CM | POA: Diagnosis not present

## 2021-09-30 DIAGNOSIS — M1711 Unilateral primary osteoarthritis, right knee: Secondary | ICD-10-CM | POA: Diagnosis present

## 2021-09-30 DIAGNOSIS — Z96652 Presence of left artificial knee joint: Secondary | ICD-10-CM | POA: Diagnosis present

## 2021-09-30 DIAGNOSIS — Z872 Personal history of diseases of the skin and subcutaneous tissue: Secondary | ICD-10-CM | POA: Insufficient documentation

## 2021-09-30 DIAGNOSIS — G629 Polyneuropathy, unspecified: Secondary | ICD-10-CM | POA: Insufficient documentation

## 2021-09-30 DIAGNOSIS — M19072 Primary osteoarthritis, left ankle and foot: Secondary | ICD-10-CM | POA: Insufficient documentation

## 2021-09-30 DIAGNOSIS — S30861D Insect bite (nonvenomous) of abdominal wall, subsequent encounter: Secondary | ICD-10-CM | POA: Insufficient documentation

## 2021-09-30 DIAGNOSIS — R5382 Chronic fatigue, unspecified: Secondary | ICD-10-CM | POA: Insufficient documentation

## 2021-09-30 DIAGNOSIS — W57XXXD Bitten or stung by nonvenomous insect and other nonvenomous arthropods, subsequent encounter: Secondary | ICD-10-CM | POA: Insufficient documentation

## 2021-09-30 DIAGNOSIS — M797 Fibromyalgia: Secondary | ICD-10-CM | POA: Insufficient documentation

## 2021-09-30 DIAGNOSIS — L4050 Arthropathic psoriasis, unspecified: Secondary | ICD-10-CM | POA: Insufficient documentation

## 2021-09-30 DIAGNOSIS — M5136 Other intervertebral disc degeneration, lumbar region: Secondary | ICD-10-CM | POA: Diagnosis not present

## 2021-09-30 DIAGNOSIS — M5030 Other cervical disc degeneration, unspecified cervical region: Secondary | ICD-10-CM | POA: Insufficient documentation

## 2021-09-30 DIAGNOSIS — M8589 Other specified disorders of bone density and structure, multiple sites: Secondary | ICD-10-CM | POA: Insufficient documentation

## 2021-09-30 DIAGNOSIS — M19042 Primary osteoarthritis, left hand: Secondary | ICD-10-CM | POA: Insufficient documentation

## 2021-09-30 DIAGNOSIS — M19071 Primary osteoarthritis, right ankle and foot: Secondary | ICD-10-CM | POA: Insufficient documentation

## 2021-09-30 NOTE — Patient Instructions (Signed)
Standing Labs We placed an order today for your standing lab work.   Please have your standing labs drawn in November and every 3 months  If possible, please have your labs drawn 2 weeks prior to your appointment so that the provider can discuss your results at your appointment.  Please note that you may see your imaging and lab results in [facility] before we have reviewed them. We may be awaiting multiple results to interpret others before contacting you. Please allow our office up to 72 hours to thoroughly review all of the results before contacting the office for clarification of your results.  We have open lab daily: Monday through Thursday from 1:30-4:30 PM and Friday from 1:30-4:00 PM at the office of Dr. Bo Merino, [facility].   Please be advised, all patients with office appointments requiring lab work will take precedent over walk-in lab work.  If possible, please come for your lab work on Monday and Friday afternoons, as you may experience shorter wait times. The office is located at [address], [place], [place], [place] [address] No appointment is necessary.   Labs are drawn by [facility]. Please bring your co-pay at the time of your lab draw.  You may receive a bill from [facility] for your lab work.  Please note if you are on Hydroxychloroquine and and an order has been placed for a Hydroxychloroquine level, you will need to have it drawn 4 hours or more after your last dose.  If you wish to have your labs drawn at another location, please call the office 24 hours in advance to send orders.  If you have any questions regarding directions or hours of operation,  please call [phone].   As a reminder, please drink plenty of water p  Vaccines You are taking a medication(s) that can suppress your immune system.  The following immunizations are recommended: Flu annually Covid-19  Td/Tdap (tetanus, diphtheria, pertussis) every 10 years Pneumonia (Prevnar 15  then Pneumovax 23 at least 1 year apart.  Alternatively, can take Prevnar 20 without needing additional dose) Shingrix: 2 doses from 4 weeks to 6 months apart  Please check with your PCP to make sure you are up to date.   If you have signs or symptoms of an infection or start antibiotics: First, call your PCP for workup of your infection. Hold your medication through the infection, until you complete your antibiotics, and until symptoms resolve if you take the following: Injectable medication (Actemra, Benlysta, Cimzia, Cosentyx, Enbrel, Humira, Kevzara, Orencia, Remicade, Simponi, Stelara, Taltz, Tremfya) Methotrexate Leflunomide (Arava) Mycophenolate (Cellcept) Morrie Sheldon, Olumiant, or Rinvoq

## 2021-10-01 ENCOUNTER — Other Ambulatory Visit: Payer: Self-pay | Admitting: *Deleted

## 2021-10-01 DIAGNOSIS — Z79899 Other long term (current) drug therapy: Secondary | ICD-10-CM | POA: Diagnosis not present

## 2021-10-01 LAB — COMPLETE METABOLIC PANEL WITH GFR
AG Ratio: 1.6 (calc) (ref 1.0–2.5)
ALT: 18 U/L (ref 6–29)
AST: 16 U/L (ref 10–35)
Albumin: 4.3 g/dL (ref 3.6–5.1)
Alkaline phosphatase (APISO): 90 U/L (ref 37–153)
BUN: 21 mg/dL (ref 7–25)
CO2: 29 mmol/L (ref 20–32)
Calcium: 9.7 mg/dL (ref 8.6–10.4)
Chloride: 103 mmol/L (ref 98–110)
Creat: 0.76 mg/dL (ref 0.50–1.05)
Globulin: 2.7 g/dL (calc) (ref 1.9–3.7)
Glucose, Bld: 97 mg/dL (ref 65–99)
Potassium: 4.6 mmol/L (ref 3.5–5.3)
Sodium: 139 mmol/L (ref 135–146)
Total Bilirubin: 0.7 mg/dL (ref 0.2–1.2)
Total Protein: 7 g/dL (ref 6.1–8.1)
eGFR: 85 mL/min/{1.73_m2} (ref >=60)

## 2021-10-01 LAB — IRON,TIBC AND FERRITIN PANEL
%SAT: 32 % (calc) (ref 16–45)
Ferritin: 254 ng/mL (ref 16–288)
Iron: 93 ug/dL (ref 45–160)
TIBC: 292 mcg/dL (calc) (ref 250–450)

## 2021-10-01 LAB — CBC WITH DIFFERENTIAL/PLATELET
Absolute Monocytes: 371 cells/uL (ref 200–950)
Basophils Absolute: 52 cells/uL (ref 0–200)
Basophils Relative: 0.9 %
Eosinophils Absolute: 81 cells/uL (ref 15–500)
Eosinophils Relative: 1.4 %
HCT: 39.3 % (ref 35.0–45.0)
Hemoglobin: 13.7 g/dL (ref 11.7–15.5)
Lymphs Abs: 2129 cells/uL (ref 850–3900)
MCH: 33.5 pg — ABNORMAL HIGH (ref 27.0–33.0)
MCHC: 34.9 g/dL (ref 32.0–36.0)
MCV: 96.1 fL (ref 80.0–100.0)
MPV: 10 fL (ref 7.5–12.5)
Monocytes Relative: 6.4 %
Neutro Abs: 3167 cells/uL (ref 1500–7800)
Neutrophils Relative %: 54.6 %
Platelets: 288 10*3/uL (ref 140–400)
RBC: 4.09 10*6/uL (ref 3.80–5.10)
RDW: 13.8 % (ref 11.0–15.0)
Total Lymphocyte: 36.7 %
WBC: 5.8 10*3/uL (ref 3.8–10.8)

## 2021-10-01 NOTE — Progress Notes (Signed)
CBC, CMP and iron studies are within normal limits.  Please notify patient's and forward results to Dr. Irene Limbo.

## 2021-10-02 ENCOUNTER — Encounter: Payer: Self-pay | Admitting: Rheumatology

## 2021-10-02 NOTE — Telephone Encounter (Signed)
Please notify patient that I cannot order MRI at [facility] imaging.  It can be only ordered at the hospital.  I am uncertain if MRI of the cervical spine will be approved without the x-ray.  If she wants to proceed we can try to place an order for MRI of the cervical spine due to severe neck pain and radiculopathy.

## 2021-10-05 LAB — QUANTIFERON-TB GOLD PLUS
Mitogen-NIL: >10 IU/mL
NIL: 0.02 IU/mL
QuantiFERON-TB Gold Plus: NEGATIVE
TB1-NIL: 0.01 IU/mL
TB2-NIL: 0.02 IU/mL

## 2021-10-05 NOTE — Progress Notes (Signed)
TB Gold is negative.

## 2021-10-15 DIAGNOSIS — Z6834 Body mass index (BMI) 34.0-34.9, adult: Secondary | ICD-10-CM | POA: Diagnosis not present

## 2021-10-15 DIAGNOSIS — M5412 Radiculopathy, cervical region: Secondary | ICD-10-CM | POA: Diagnosis not present

## 2021-10-22 DIAGNOSIS — M5412 Radiculopathy, cervical region: Secondary | ICD-10-CM | POA: Diagnosis not present

## 2021-10-22 DIAGNOSIS — M542 Cervicalgia: Secondary | ICD-10-CM | POA: Diagnosis not present

## 2021-10-28 DIAGNOSIS — M5412 Radiculopathy, cervical region: Secondary | ICD-10-CM | POA: Diagnosis not present

## 2021-11-04 ENCOUNTER — Other Ambulatory Visit (HOSPITAL_BASED_OUTPATIENT_CLINIC_OR_DEPARTMENT_OTHER): Payer: Self-pay | Admitting: Internal Medicine

## 2021-11-04 ENCOUNTER — Other Ambulatory Visit (HOSPITAL_BASED_OUTPATIENT_CLINIC_OR_DEPARTMENT_OTHER): Payer: Self-pay | Admitting: Obstetrics & Gynecology

## 2021-11-04 DIAGNOSIS — Z1231 Encounter for screening mammogram for malignant neoplasm of breast: Secondary | ICD-10-CM

## 2021-11-07 ENCOUNTER — Other Ambulatory Visit: Payer: Self-pay | Admitting: Internal Medicine

## 2021-11-11 ENCOUNTER — Ambulatory Visit (INDEPENDENT_AMBULATORY_CARE_PROVIDER_SITE_OTHER): Payer: Medicare Other | Admitting: Internal Medicine

## 2021-11-11 ENCOUNTER — Encounter: Payer: Self-pay | Admitting: Internal Medicine

## 2021-11-11 VITALS — BP 124/82 | HR 76 | Temp 97.6°F | Wt 203.2 lb

## 2021-11-11 DIAGNOSIS — Z23 Encounter for immunization: Secondary | ICD-10-CM

## 2021-11-11 DIAGNOSIS — E611 Iron deficiency: Secondary | ICD-10-CM | POA: Diagnosis not present

## 2021-11-11 DIAGNOSIS — I10 Essential (primary) hypertension: Secondary | ICD-10-CM | POA: Diagnosis not present

## 2021-11-11 DIAGNOSIS — Z79899 Other long term (current) drug therapy: Secondary | ICD-10-CM

## 2021-11-11 DIAGNOSIS — L4050 Arthropathic psoriasis, unspecified: Secondary | ICD-10-CM | POA: Diagnosis not present

## 2021-11-11 DIAGNOSIS — G629 Polyneuropathy, unspecified: Secondary | ICD-10-CM | POA: Diagnosis not present

## 2021-11-11 NOTE — Progress Notes (Signed)
Chief Complaint  Patient presents with   Follow-up    HPI: Jade Cardenas 71 y.o. come in for Chronic disease management  Ask about getting off the PPI .  Because of the risk of malabsorption iron calcium etc.  I believe she is but was placed on it by the GI team when she presented with a gastric ulcer that was probably related to NSAID use.  She does get some reflux GI symptoms but not significant. Had a recent iron infusion x1  BP:   good control has log from home; we were going to consider D bribing blood pressure medicine if needed.  Is on losartan 100 mg amlodipine 5 mg  Psoriatic arthritis : Under rheumatology care seems stable; Had labs in august   Neck pain  increasing. in past : hx of 2 surgeries . Saw Dr Trenton Gammon  MRI radiating into shoulders.   To do PT .    Dr Posey Pronto :eval for neuropathy:    said not neuropathy .   Did ncs small fiber  neuropathy and hands . Help devices  to open things.  Has been placed on Cymbalta 30 mg by podiatry.  To help some of her symptoms. ROS: See pertinent positives and negatives per HPI.  Past Medical History:  Diagnosis Date   Anemia    iron def   Arthritis    rheumatoid, psoriatic, osteoarthritis    Bone spur    RIGHT FOOT    Broken toe    Bruises easily    Cellulitis    Chronic back pain    COLONIC POLYPS, HX OF 02/09/2007   Cough, persistent 01/21/2012   poss from acei    DES exposure in utero, unknown    Femur fracture, left (HCC)    Fibromyalgia    doesn't require meds   GASTRIC ULCER, ACUTE, HEMORRHAGE, HX OF 02/09/2007   GERD (gastroesophageal reflux disease)    takes Nexium daily   Goiter    H/O hiatal hernia    H/O: hysterectomy    when 71 yrs old   Hx of seasonal allergies    takes Claritin daily   HYPERLIPIDEMIA 12/29/2006   takes Lovastatin nightly   HYPERTENSION 12/29/2006   takes Enalapril daily   IBS 04/28/2007   Insomnia    related to pain;takes Flexeril and Tylenol PM nightly   Joint pain    Joint  swelling    Neuropathy    small cell   Osteopenia    Palpitations 02/09/2007   PONV (postoperative nausea and vomiting)    NAUSEA   REDUCTION MAMMOPLASTY, HX OF 02/09/2007   Right bundle branch block 02/09/2007   S/P lumbar fusion 6 13 09/03/2011   L4 L5  posterior    SYNCOPE 12/07/2007   Tendonitis    RLE    UNS ADVRS EFF OTH RX MEDICINAL\T\BIOLOGICAL SBSTNC 02/09/2007    Family History  Problem Relation Age of Onset   Diabetes Mother    Colon cancer Mother        x2   Arthritis Mother    Hypertension Mother    Endometrial cancer Mother    Stroke Father    Heart disease Father    Colon cancer Father    Arthritis Brother    Hypertension Brother    Hyperlipidemia Brother    Infertility Brother    Diabetes Brother    Other Other        DISH brother   Hypertension Maternal Grandmother  Stroke Maternal Grandmother    Anesthesia problems Neg Hx    Hypotension Neg Hx    Malignant hyperthermia Neg Hx    Pseudochol deficiency Neg Hx     Social History   Socioeconomic History   Marital status: Widowed    Spouse name: Not on file   Number of children: 0   Years of education: Not on file   Highest education level: Master's degree (e.g., MA, MS, MEng, MEd, MSW, MBA)  Occupational History   Not on file  Tobacco Use   Smoking status: Never   Smokeless tobacco: Never  Vaping Use   Vaping Use: Never used  Substance and Sexual Activity   Alcohol use: No    Alcohol/week: 0.0 standard drinks of alcohol   Drug use: No   Sexual activity: Not Currently    Partners: Male    Birth control/protection: Surgical    Comment: TAH/BSO  Other Topics Concern   Not on file  Social History Narrative   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  Husband died suddenly 2010Non smoker         Left handed    Lives in a one story home       Social Determinants of Health   Financial Resource Strain: Medium Risk (11/07/2021)    Overall Financial Resource Strain (CARDIA)    Difficulty of Paying Living Expenses: Somewhat hard  Food Insecurity: No Food Insecurity (11/07/2021)   Hunger Vital Sign    Worried About Running Out of Food in the Last Year: Never true    [place] in the Last Year: Never true  Transportation Needs: No Transportation Needs (11/07/2021)   PRAPARE - Hydrologist (Medical): No    Lack of Transportation (Non-Medical): No  Physical Activity: Sufficiently Active (11/07/2021)   Exercise Vital Sign    Days of Exercise per Week: 7 days    Minutes of Exercise per Session: 50 min  Stress: No Stress Concern Present (11/07/2021)   [place]    Feeling of Stress : Only a little  Social Connections: Moderately Isolated (11/07/2021)   Social Connection and Isolation Panel [NHANES]    Frequency of Communication with Friends and Family: More than three times a week    Frequency of Social Gatherings with Friends and Family: More than three times a week    Attends Religious Services: Never    Marine scientist or Organizations: Yes    Attends Archivist Meetings: 1 to 4 times per year    Marital Status: Widowed    Outpatient Medications Prior to Visit  Medication Sig Dispense Refill   acetaminophen (TYLENOL) 650 MG CR tablet Take 1,300 mg by mouth See admin instructions. Take 1300 mg in the morning and may take an additional 1300 mg dose as needed for pain     amLODipine (NORVASC) 5 MG tablet TAKE 1 TABLET BY MOUTH EVERY DAY 90 tablet 0   B-D ALLERGY SYRINGE 1CC/28G 28G X 1/2" 1 ML MISC USE AS DIRECTED ONCE WEEKLY 12 each 3   Calcium Carb-Cholecalciferol (CALCIUM+D3 PO) Take 1 tablet by mouth daily.     diphenhydramine-acetaminophen (TYLENOL PM) 25-500 MG TABS tablet Take 1 tablet by mouth at bedtime.     DULoxetine (CYMBALTA) 30 MG capsule TAKE 1 CAPSULE BY MOUTH EVERY DAY 90 capsule 1   losartan  (COZAAR) 100 MG  tablet TAKE 1 TABLET BY MOUTH EVERY DAY 90 tablet 0   lovastatin (MEVACOR) 20 MG tablet TAKE 1 TABLET BY MOUTH EVERY DAY IN THE EVENING 90 tablet 0   Methotrexate Sodium (METHOTREXATE, PF,) 50 MG/2ML injection INJECT 0.3ML INTO THE SKIN ONCE WEEKLY 8 mL 2   Multiple Vitamins-Minerals (MULTIVITAMIN ADULTS PO) Take 1 tablet by mouth daily.      omeprazole (PRILOSEC) 40 MG capsule TAKE 1 CAPSULE BY MOUTH EVERY DAY 90 capsule 0   SIMPONI 50 MG/0.5ML SOAJ INJECT '50MG'$  (1 PEN) UNDER THE SKIN ONCE MONTHLY 1.5 mL ML   Facility-Administered Medications Prior to Visit  Medication Dose Route Frequency Provider Last Rate Last Admin   dexamethasone (DECADRON) injection 4 mg  4 mg Intra-articular Once Trula Slade, DPM         EXAM:  BP 124/82 (BP Location: Left Arm, Patient Position: Sitting, Cuff Size: Large)   Pulse 76   Temp 97.6 F (36.4 C) (Oral)   Wt 203 lb 3.2 oz (92.2 kg)   LMP 03/02/1984   SpO2 99%   BMI 33.81 kg/m   Body mass index is 33.81 kg/m.  GENERAL: vitals reviewed and listed above, alert, oriented, appears well hydrated and in no acute distress HEENT: atraumatic, conjunctiva  clear, no obvious abnormalities on inspection of external nose and earsNECK: no obvious masses on inspection palpation somewhat stiff kyphosis LUNGS: clear to auscultation bilaterally, no wheezes, rales or rhonchi, good air movement CV: HRRR, no clubbing cyanosis or  peripheral edema nl cap refill  Skin 3 to 4 cm what feels like lipoma in the right upper back and area tiny of scaling 1 to 2 mm (of note she states it can bleed with scratching) MS: moves all extremities without noticeable focal  abnormality PSYCH: pleasant and cooperative, no obvious depression or anxiety Lab Results  Component Value Date   WBC 5.8 09/30/2021   HGB 13.7 09/30/2021   HCT 39.3 09/30/2021   PLT 288 09/30/2021   GLUCOSE 97 09/30/2021   CHOL 179 12/17/2020   TRIG 156.0 (H) 12/17/2020   HDL 63.50  12/17/2020   LDLDIRECT 114.0 04/02/2010   LDLCALC 84 12/17/2020   ALT 18 09/30/2021   AST 16 09/30/2021   NA 139 09/30/2021   K 4.6 09/30/2021   CL 103 09/30/2021   CREATININE 0.76 09/30/2021   BUN 21 09/30/2021   CO2 29 09/30/2021   TSH 0.85 12/17/2020   INR 0.96 06/16/2017   HGBA1C 5.4 12/17/2020   BP Readings from Last 3 Encounters:  11/11/21 124/82  09/30/21 (!) 143/81  09/04/21 138/72    ASSESSMENT AND PLAN:  Discussed the following assessment and plan:  Essential hypertension - Controlled at this time would not change medicine reevaluate at next visit  Medication management - Agree with trying to wean or minimize PPI could use famotidine if needed she is remote from GI bleed when medicine was first prescribed  Iron deficiency  Psoriatic arthritis (HCC)  High risk medication use  Influenza vaccine needed - Plan: Flu Vaccine QUAD High Dose(Fluad)  Small fiber neuropathy Plan follow-up in late winter early spring call ahead for appropriate lab before visit -Patient advised to return or notify health care team  if  new concerns arise. Consider RSV vaccine in addition to flu vaccine COVID booster options up to her but she does have immunosuppression (aware of no human data trials.) Record reviewed counseling plan 30 minutes Patient Instructions  Ok to wean the ppi  every 2-3 days . and use as needed can use famotidine  20 mg (pepcid twice a day)   on days off PPI.   BP control. Good for now   in future we could consider  decreasing dose but would stay for now.  Plan 4-6 months and updated labs. As indicated  such as lipids.    Standley Brooking. Adeyemi Hamad M.D.

## 2021-11-11 NOTE — Patient Instructions (Addendum)
Ok to wean the ppi   every 2-3 days . and use as needed can use famotidine  20 mg (pepcid twice a day)   on days off PPI.   BP control. Good for now   in future we could consider  decreasing dose but would stay for now.  Plan 4-6 months and updated labs. As indicated  such as lipids.

## 2021-11-19 DIAGNOSIS — M542 Cervicalgia: Secondary | ICD-10-CM | POA: Diagnosis not present

## 2021-11-21 ENCOUNTER — Ambulatory Visit (HOSPITAL_BASED_OUTPATIENT_CLINIC_OR_DEPARTMENT_OTHER)
Admission: RE | Admit: 2021-11-21 | Discharge: 2021-11-21 | Disposition: A | Discharge status: Home / Self Care | Payer: Medicare Other | Source: Ambulatory Visit | Attending: Obstetrics & Gynecology | Admitting: Obstetrics & Gynecology

## 2021-11-21 DIAGNOSIS — Z1231 Encounter for screening mammogram for malignant neoplasm of breast: Secondary | ICD-10-CM | POA: Insufficient documentation

## 2021-11-24 ENCOUNTER — Encounter: Payer: Self-pay | Admitting: Internal Medicine

## 2021-11-24 DIAGNOSIS — Z23 Encounter for immunization: Secondary | ICD-10-CM | POA: Diagnosis not present

## 2021-11-26 DIAGNOSIS — L821 Other seborrheic keratosis: Secondary | ICD-10-CM | POA: Diagnosis not present

## 2021-11-26 DIAGNOSIS — L814 Other melanin hyperpigmentation: Secondary | ICD-10-CM | POA: Diagnosis not present

## 2021-11-26 DIAGNOSIS — D171 Benign lipomatous neoplasm of skin and subcutaneous tissue of trunk: Secondary | ICD-10-CM | POA: Diagnosis not present

## 2021-11-26 DIAGNOSIS — L570 Actinic keratosis: Secondary | ICD-10-CM | POA: Diagnosis not present

## 2021-11-26 DIAGNOSIS — D2272 Melanocytic nevi of left lower limb, including hip: Secondary | ICD-10-CM | POA: Diagnosis not present

## 2021-11-26 DIAGNOSIS — D1801 Hemangioma of skin and subcutaneous tissue: Secondary | ICD-10-CM | POA: Diagnosis not present

## 2021-11-26 DIAGNOSIS — D2271 Melanocytic nevi of right lower limb, including hip: Secondary | ICD-10-CM | POA: Diagnosis not present

## 2021-11-26 DIAGNOSIS — L438 Other lichen planus: Secondary | ICD-10-CM | POA: Diagnosis not present

## 2021-12-08 ENCOUNTER — Other Ambulatory Visit: Payer: Self-pay | Admitting: Internal Medicine

## 2021-12-09 DIAGNOSIS — Z6834 Body mass index (BMI) 34.0-34.9, adult: Secondary | ICD-10-CM | POA: Diagnosis not present

## 2021-12-09 DIAGNOSIS — M5412 Radiculopathy, cervical region: Secondary | ICD-10-CM | POA: Diagnosis not present

## 2021-12-17 ENCOUNTER — Encounter: Payer: Self-pay | Admitting: Neurology

## 2021-12-17 ENCOUNTER — Telehealth (INDEPENDENT_AMBULATORY_CARE_PROVIDER_SITE_OTHER): Payer: Medicare Other | Admitting: Neurology

## 2021-12-17 VITALS — BP 122/80 | HR 82 | Ht 65.0 in | Wt 201.0 lb

## 2021-12-17 DIAGNOSIS — G629 Polyneuropathy, unspecified: Secondary | ICD-10-CM | POA: Diagnosis not present

## 2021-12-17 DIAGNOSIS — M4802 Spinal stenosis, cervical region: Secondary | ICD-10-CM | POA: Diagnosis not present

## 2021-12-17 DIAGNOSIS — R29898 Other symptoms and signs involving the musculoskeletal system: Secondary | ICD-10-CM | POA: Diagnosis not present

## 2021-12-17 MED ORDER — DULOXETINE HCL 60 MG PO CPEP: 60.0000 mg | ORAL_CAPSULE | Freq: Every day | ORAL | 5 refills | Status: DC

## 2021-12-17 NOTE — Patient Instructions (Addendum)
Nerve testing of the arms  Increase Cymbalta to '60mg'$  daily  Return to clinic in 1 year  [place] (EMG/NCS) INSTRUCTIONS  How to Prepare The neurologist conducting the EMG will need to know if you have certain medical conditions. Tell the neurologist and other EMG lab personnel if you: Have a pacemaker or any other electrical medical device Take blood-thinning medications Have hemophilia, a blood-clotting disorder that causes prolonged bleeding Bathing Take a shower or bath shortly before your exam in order to remove oils from your skin. Don't apply lotions or creams before the exam.  What to Expect You'll likely be asked to change into a hospital gown for the procedure and lie down on an examination table. The following explanations can help you understand what will happen during the exam.  Electrodes. The neurologist or a technician places surface electrodes at various locations on your skin depending on where you're experiencing symptoms. Or the neurologist may insert needle electrodes at different sites depending on your symptoms.  Sensations. The electrodes will at times transmit a tiny electrical current that you may feel as a twinge or spasm. The needle electrode may cause discomfort or pain that usually ends shortly after the needle is removed. If you are concerned about discomfort or pain, you may want to talk to the neurologist about taking a short break during the exam.  Instructions. During the needle EMG, the neurologist will assess whether there is any spontaneous electrical activity when the muscle is at rest - activity that isn't present in healthy muscle tissue - and the degree of activity when you slightly contract the muscle.  He or she will give you instructions on resting and contracting a muscle at appropriate times. Depending on what muscles and nerves the neurologist is examining, he or she may ask you to change positions during the exam.   After your EMG You may experience some temporary, minor bruising where the needle electrode was inserted into your muscle. This bruising should fade within several days. If it persists, contact your primary care doctor.

## 2021-12-17 NOTE — Progress Notes (Signed)
Follow-up Visit   Date: 12/17/2021    SHAUNIKA ITALIANO MRN: [id number] DOB: [birth year]    Jade Cardenas is a 71 y.o. left-handed Caucasian female with psoriatic arthritis, hypertension, hyperlipidemia, s/p lumbar surgery  x 2 and cervical surgery x 2 returning to the clinic for follow-up of small fiber neuropathy.  The patient was accompanied to the clinic by self.  IMPRESSION/PLAN: Small fiber neuropathy confirm by skin biopsy, possibly contributed by immune-mediated arthritis and age. I had extensive discussion with the patient regarding the pathogenesis, etiology, management, and natural course of neuropathy. Neuropathy tends to be slowly progressive. I discussed that management is symptomatic.   - For pain, Cymbalta can be increased to '60mg'$  daily  2.  Bilateral hand weakness >> paresthesias, most likely due to cervical canal stenosis and radiculopathy.  - To be sure an overlapping entrapment neuropathy is not missed, she will return for NCS/EMG   Further recommendations pending results.   --------------------------------------------- History of present illness: Starting in late 2021, she began having numbness/tingling involving the soles and the lateral side of her right foot.  She also has similar symptoms over the soles of the left foot.  Symptoms are constant. She has some weakness in the right foot, especially with her toes.  Occasionally, she has shooting pain down the back of her right leg.   She has history two cervical and two lumbar surgeries by Dr. Trenton Gammon.  She has no history of diabetes, alcohol use, or chemotherapy.  UPDATE 12/17/2021:   She is here for follow-up visit for bilateral feet numbness/tingling.  Earlier this year, she had NCS/EMG of the legs which was normal.  Subsequently, she had skin biopsy which is consistent with small fiber neuropathy.    She is seeing Dr. Trenton Gammon who has been following her for cervical canal stenosis.  She complains of  weakness opening jars/bottles.  She had intermittent numbness/tingling in the hands.   Medications:  Current Outpatient Medications on File Prior to Visit  Medication Sig Dispense Refill   acetaminophen (TYLENOL) 650 MG CR tablet Take 1,300 mg by mouth See admin instructions. Take 1300 mg in the morning and may take an additional 1300 mg dose as needed for pain     amLODipine (NORVASC) 5 MG tablet TAKE 1 TABLET BY MOUTH EVERY DAY 90 tablet 0   B-D ALLERGY SYRINGE 1CC/28G 28G X 1/2" 1 ML MISC USE AS DIRECTED ONCE WEEKLY 12 each 3   Calcium Carb-Cholecalciferol (CALCIUM+D3 PO) Take 1 tablet by mouth daily.     diphenhydramine-acetaminophen (TYLENOL PM) 25-500 MG TABS tablet Take 1 tablet by mouth at bedtime.     DULoxetine (CYMBALTA) 30 MG capsule TAKE 1 CAPSULE BY MOUTH EVERY DAY 90 capsule 1   losartan (COZAAR) 100 MG tablet TAKE 1 TABLET BY MOUTH EVERY DAY 90 tablet 1   lovastatin (MEVACOR) 20 MG tablet TAKE 1 TABLET BY MOUTH EVERY DAY IN THE EVENING 90 tablet 1   meloxicam (MOBIC) 15 MG tablet Take 15 mg by mouth daily.     Methotrexate Sodium (METHOTREXATE, PF,) 50 MG/2ML injection INJECT 0.3ML INTO THE SKIN ONCE WEEKLY 8 mL 2   Multiple Vitamins-Minerals (MULTIVITAMIN ADULTS PO) Take 1 tablet by mouth daily.      omeprazole (PRILOSEC) 40 MG capsule TAKE 1 CAPSULE BY MOUTH EVERY DAY 90 capsule 0   SIMPONI 50 MG/0.5ML SOAJ INJECT '50MG'$  (1 PEN) UNDER THE SKIN ONCE MONTHLY 1.5 mL ML   Current Facility-Administered Medications on File Prior  to Visit  Medication Dose Route Frequency Provider Last Rate Last Admin   dexamethasone (DECADRON) injection 4 mg  4 mg Intra-articular Once Trula Slade, DPM        Allergies:  Allergies  Allergen Reactions   Erythromycin Hives, Swelling and Other (See Comments)    All Mycins; throat swells shut and she gets "really hot"   Iodinated Contrast Media Shortness Of Breath and Swelling    CT contrast; throat swelling, difficulty breathing.  In [place]  "many years ago."   Ace Inhibitors Cough   Indomethacin Other (See Comments)   Keflex [Cephalexin] Hives   Macrobid [Nitrofurantoin] Other (See Comments)    dizziness    Vital Signs:  BP 122/80   Pulse 82   Ht '5\' 5"'$  (1.651 m)   Wt 201 lb (91.2 kg)   LMP 03/02/1984   SpO2 99%   BMI 33.45 kg/m    Neurological Exam: MENTAL STATUS including orientation to time, place, person, recent and remote memory, attention span and concentration, language, and fund of knowledge is normal.  Speech is not dysarthric.  CRANIAL NERVES:  No visual field defects.  Pupils equal round and reactive to light.  Normal conjugate, extra-ocular eye movements in all directions of gaze.  Right mild-mod ptosis, no worsening with sustained upgaze.  Face is symmetric. Palate elevates symmetrically.  Tongue is midline.  MOTOR:  Motor strength is 5/5 in all extremities.  No atrophy, fasciculations or abnormal movements.  No pronator drift.  Tone is normal.    MSRs:                                           Right        Left brachioradialis 2+  2+  biceps 2+  2+  triceps 2+  2+  patellar 3+  3+  ankle jerk 0  0  plantar response down  down   SENSORY:  Vibration reduced at the feet.  COORDINATION/GAIT:  Normal finger-to- nose-finger.  Intact rapid alternating movements bilaterally.  Gait is mildly wide-based, unassisted, stable.  Data: NCS/EMG of the legs 04/03/2021: This is a normal study of the lower extremities.  In particular, there is no evidence of a sensorimotor polyneuropathy or lumbosacral radiculopathy.  MRI lumbar spine wo contrast 02/26/2021: 1. Prior L4-L5 PLIF with new mild adjacent segment disease at L3-L4. No high-grade spinal canal or neuroforaminal stenosis at any level 2. Progressive severe bilateral facet arthropathy at L5-S1.   Lab Results  Component Value Date   TSH 0.85 12/17/2020   Lab Results  Component Value Date   [id number] 11/10/2019    Thank you for allowing me to  participate in patient's care.  If I can answer any additional questions, I would be pleased to do so.    Sincerely,    Nazeer Romney K. Posey Pronto, DO

## 2021-12-22 ENCOUNTER — Other Ambulatory Visit: Payer: Self-pay | Admitting: Physician Assistant

## 2021-12-22 NOTE — Telephone Encounter (Signed)
Next Visit: 03/24/2022  Last Visit: 09/30/2021  Last Fill: 01/13/2021  DX: Psoriatic arthritis   Current Dose per office note 09/30/2021: methotrexate 0.3 ml every 7 days   Labs: 09/30/2021 CBC, CMP and iron studies are within normal limits.   Okay to refill MTX?

## 2021-12-25 ENCOUNTER — Ambulatory Visit (INDEPENDENT_AMBULATORY_CARE_PROVIDER_SITE_OTHER): Payer: Medicare Other | Admitting: Neurology

## 2021-12-25 DIAGNOSIS — M4802 Spinal stenosis, cervical region: Secondary | ICD-10-CM

## 2021-12-25 DIAGNOSIS — R29898 Other symptoms and signs involving the musculoskeletal system: Secondary | ICD-10-CM

## 2021-12-25 NOTE — Procedures (Signed)
[facility]  [place], [place]  [place], [place] [address] Tel: [phone] Fax: [phone] Test Date:  12/25/2021  Patient: Jade Cardenas DOB: 11/03/1950 Physician: Narda Amber, DO  Sex: Female Height: '5\' 5"'$  Ref Phys: Narda Amber, DO  ID#: [id number]   Technician:    History: This is a 71 year old female referred for evaluation of bilateral hand weakness.  NCV & EMG Findings: Extensive electrodiagnostic testing of the right upper extremity and additional studies of the left shows:  Bilateral median, ulnar, and mixed palmar sensory responses are within normal limits. Bilateral median and ulnar motor responses are within normal limits. Chronic motor axonal loss changes are seen affecting bilateral biceps and deltoid muscles, without accompanied active denervation.  Impression: Chronic C5-C6 radiculopathy affecting bilateral upper extremities, mild. There is no evidence of carpal tunnel syndrome or ulnar neuropathy affecting either upper extremity.   ___________________________ Narda Amber, DO    Nerve Conduction Studies Motor Nerve Results    Latency Amplitude F-Lat Segment Distance CV Comment  Site (ms) Norm (mV) Norm (ms)  (cm) (m/s) Norm   Left Median (APB) Motor  Wrist 3.4  < 4.0 10.2  > 5.0        Elbow 8.3 - 9.1 -  Elbow-Wrist 27 55  > 50   Right Median (APB) Motor  Wrist 3.1  < 4.0 10.9  > 5.0        Elbow 8.3 - 10.9 -  Elbow-Wrist 29 56  > 50   Left Ulnar (ADM) Motor  Wrist 2.7  < 3.1 10.3  > 7.0        Bel elbow 6.2 - 10.2 -  Bel elbow-Wrist 20 57  > 50   Ab elbow 8.1 - 10.2 -  Ab elbow-Bel elbow 10 53 -   Right Ulnar (ADM) Motor  Wrist 2.4  < 3.1 11.7  > 7.0        Bel elbow 5.9 - 10.1 -  Bel elbow-Wrist 21 60  > 50   Ab elbow 7.6 - 9.6 -  Ab elbow-Bel elbow 10 59 -    Sensory Sites    Neg Peak Lat Amplitude (O-P) Segment Distance Velocity Comment  Site (ms) Norm (V) Norm  (cm) (ms)   Left Median Sensory  Wrist-Dig II 3.3   < 3.8 31  > 10 Wrist-Dig II 13    Right Median Sensory  Wrist-Dig II 3.0  < 3.8 29  > 10 Wrist-Dig II 13    Left Median-Ulnar Palmar Sensory       Median  Palm-Wrist 1.75  < 2.2 75  > 10 Palm-Wrist 8         Ulnar  Palm-Wrist 1.75  < 2.2 21  > 5 Palm-Wrist 8    Right Median-Ulnar Palmar Sensory       Median  Palm-Wrist 1.55  < 2.2 70  > 10 Palm-Wrist 8         Ulnar  Palm-Wrist 1.58  < 2.2 18  > 5 Palm-Wrist 8    Left Ulnar Sensory  Wrist-Dig V 2.9  < 3.2 29  > 5 Wrist-Dig V 11    Right Ulnar Sensory  Wrist-Dig V 2.8  < 3.2 20  > 5 Wrist-Dig V 11     Inter-Nerve Comparisons   Nerve 1 Value 1 Nerve 2 Value 2 Parameter Result Normal  Sensory Sites  R Median Palm-Wrist 1.55 ms R Ulnar Palm-Wrist 1.58 ms Peak Lat Diff 0.03 ms <0.30  L Median Palm-Wrist 1.75 ms L Ulnar Palm-Wrist 1.75 ms Peak Lat Diff 0 ms <0.30   Electromyography   Side Muscle Ins.Act Fibs Fasc Recrt Amp Dur Poly Activation Comment  Right FDI Nml Nml Nml Nml Nml Nml Nml Nml N/A  Right Pronator teres Nml Nml Nml Nml Nml Nml Nml Nml N/A  Right Biceps Nml Nml Nml *1- *1+ *1+ *1+ Nml N/A  Right Triceps Nml Nml Nml Nml Nml Nml Nml Nml N/A  Right Deltoid Nml Nml Nml *1- *1+ *1+ *1+ Nml N/A  Left FDI Nml Nml Nml Nml Nml Nml Nml Nml N/A  Left Pronator teres Nml Nml Nml Nml Nml Nml Nml Nml N/A  Left Biceps Nml Nml Nml *1- *1+ *1+ *1+ Nml N/A  Left Triceps Nml Nml Nml Nml Nml Nml Nml Nml N/A  Left Deltoid Nml Nml Nml *1- *1+ *1+ *1+ Nml N/A      Waveforms:  Motor           Sensory

## 2021-12-30 ENCOUNTER — Telehealth: Payer: Self-pay | Admitting: Neurology

## 2021-12-30 NOTE — Telephone Encounter (Signed)
Results of EMG discussed with patient which shows C5-6 radiculopathy.  No evidence of entrapment neuropathy.  She has known cervical canal stenosis and recommend follow-up with Dr. Trenton Gammon as stenosis is most likely what his causing her hand weakness.

## 2022-01-09 ENCOUNTER — Other Ambulatory Visit: Payer: Self-pay | Admitting: Neurology

## 2022-01-14 ENCOUNTER — Encounter: Payer: Self-pay | Admitting: Rheumatology

## 2022-01-26 ENCOUNTER — Other Ambulatory Visit: Payer: Self-pay | Admitting: Rheumatology

## 2022-01-26 DIAGNOSIS — Z79899 Other long term (current) drug therapy: Secondary | ICD-10-CM

## 2022-01-26 NOTE — Telephone Encounter (Signed)
Next Visit: 03/24/2022   Last Visit: 09/30/2021  DX: Psoriatic arthritis    Current Dose per office note 09/30/2021:   Labs: 09/30/2021 CBC, CMP and iron studies are within normal limits   TB Gold: 10/01/2021 Neg   Left message to advise patient she is due to update labs.   Okay to refill Simponi?

## 2022-01-28 ENCOUNTER — Other Ambulatory Visit: Payer: Self-pay | Admitting: *Deleted

## 2022-01-28 DIAGNOSIS — Z79899 Other long term (current) drug therapy: Secondary | ICD-10-CM | POA: Diagnosis not present

## 2022-01-29 LAB — CBC WITH DIFFERENTIAL/PLATELET
Basophils Absolute: 0.1 10*3/uL (ref 0.0–0.2)
Basos: 1 %
EOS (ABSOLUTE): 0.1 10*3/uL (ref 0.0–0.4)
Eos: 1 %
Hematocrit: 37.9 % (ref 34.0–46.6)
Hemoglobin: 13.2 g/dL (ref 11.1–15.9)
Immature Grans (Abs): 0 10*3/uL (ref 0.0–0.1)
Immature Granulocytes: 0 %
Lymphocytes Absolute: 1.6 10*3/uL (ref 0.7–3.1)
Lymphs: 29 %
MCH: 32.8 pg (ref 26.6–33.0)
MCHC: 34.8 g/dL (ref 31.5–35.7)
MCV: 94 fL (ref 79–97)
Monocytes Absolute: 0.4 10*3/uL (ref 0.1–0.9)
Monocytes: 7 %
Neutrophils Absolute: 3.4 10*3/uL (ref 1.4–7.0)
Neutrophils: 62 %
Platelets: 315 10*3/uL (ref 150–450)
RBC: 4.03 x10E6/uL (ref 3.77–5.28)
RDW: 12 % (ref 11.7–15.4)
WBC: 5.5 10*3/uL (ref 3.4–10.8)

## 2022-01-29 LAB — CMP14+EGFR
ALT: 16 IU/L (ref 0–32)
AST: 19 IU/L (ref 0–40)
Albumin/Globulin Ratio: 1.8 (ref 1.2–2.2)
Albumin: 4.8 g/dL (ref 3.9–4.9)
Alkaline Phosphatase: 100 IU/L (ref 44–121)
BUN/Creatinine Ratio: 26 (ref 12–28)
BUN: 22 mg/dL (ref 8–27)
Bilirubin Total: 0.7 mg/dL (ref 0.0–1.2)
CO2: 22 mmol/L (ref 20–29)
Calcium: 9.8 mg/dL (ref 8.7–10.3)
Chloride: 102 mmol/L (ref 96–106)
Creatinine, Ser: 0.84 mg/dL (ref 0.57–1.00)
Globulin, Total: 2.6 g/dL (ref 1.5–4.5)
Glucose: 92 mg/dL (ref 70–99)
Potassium: 5.1 mmol/L (ref 3.5–5.2)
Sodium: 140 mmol/L (ref 134–144)
Total Protein: 7.4 g/dL (ref 6.0–8.5)
eGFR: 75 mL/min/{1.73_m2} (ref >59)

## 2022-02-06 ENCOUNTER — Other Ambulatory Visit: Payer: Self-pay | Admitting: Internal Medicine

## 2022-02-11 DIAGNOSIS — M5412 Radiculopathy, cervical region: Secondary | ICD-10-CM | POA: Diagnosis not present

## 2022-02-19 ENCOUNTER — Other Ambulatory Visit: Payer: Self-pay | Admitting: Physician Assistant

## 2022-02-19 NOTE — Telephone Encounter (Signed)
Next Visit: 03/24/2022   Last Visit: 09/30/2021   DX: Psoriatic arthritis    Current Dose per office note 09/30/2021:    Labs: 01/28/2022 CBC and CMP WNL    TB Gold: 10/01/2021 Neg       Okay to refill Simponi?

## 2022-03-10 NOTE — Progress Notes (Signed)
Office Visit Note  Patient: Jade Cardenas             Date of Birth: Sep 10, 1950           MRN: [id number]             PCP: [name], MD Referring: [name], MD Visit Date: 03/24/2022 Occupation: '@GUAROCC'$ @  Subjective:  Pain in multiple joints  History of Present Illness: Jade Cardenas is a 72 y.o. female history of psoriatic arthritis, psoriasis, osteoarthritis and degenerative disc disease.  She states she went to see Dr. Trenton Gammon for degenerative disease of cervical spine.  He did x-rays and MRI and told her that it was due to underlying osteoarthritis.  She did not require surgery.  She took meloxicam for couple of months and the symptoms improved.  She still have some stiffness in her cervical spine.  She continues to have decreased grip strength and stiffness in her hands without any swelling.  She also has paresthesias in her right foot.  She was evaluated by the neurologist.  Duloxetine which does not help much.  Few flares of psoriasis with rash on her nose, lower back and legs.  She has been using topical agents.    Activities of Daily Living:  Patient reports morning stiffness for 1 hour.   Patient Reports nocturnal pain.  Difficulty dressing/grooming: Denies Difficulty climbing stairs: Reports Difficulty getting out of chair: Denies Difficulty using hands for taps, buttons, cutlery, and/or writing: Reports  Review of Systems  Constitutional:  Positive for fatigue.  HENT:  Positive for mouth dryness. Negative for mouth sores.   Eyes:  Positive for dryness.  Respiratory:  Negative for shortness of breath.   Cardiovascular:  Negative for chest pain and palpitations.  Gastrointestinal:  Negative for blood in stool, constipation and diarrhea.  Endocrine: Negative for increased urination.  Genitourinary:  Negative for involuntary urination.  Musculoskeletal:  Positive for joint pain, joint pain, myalgias, muscle weakness, morning stiffness, muscle  tenderness and myalgias. Negative for gait problem and joint swelling.  Skin:  Negative for color change, rash, hair loss and sensitivity to sunlight.  Allergic/Immunologic: Negative for susceptible to infections.  Neurological:  Positive for headaches. Negative for dizziness.  Hematological:  Negative for swollen glands.  Psychiatric/Behavioral:  Positive for sleep disturbance. Negative for depressed mood. The patient is not nervous/anxious.     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   Fibromyalgia 05/17/2016   DJD (degenerative joint disease), cervical 02/18/2016   Heel spur 09/29/2015   Ingrown toenail 09/29/2015   Foraminal stenosis of cervical region 07/30/2015   Microcytic hypochromic anemia 05/22/2015   Tailor's bunion, left 01/18/2015   H/O hiatal hernia    Bilateral primary osteoarthritis of knee 12/15/2013   Iron deficiency 12/15/2013   Psoriatic arthritis ([place]) 12/15/2013   History of foot fracture 12/15/2013   Dermatitis 10/17/2013   Hx of diethylstilbestrol (DES) exposure in utero unknown 12/09/2012   Tinnitus 06/23/2012   Scalp lesion 01/21/2012   Headache(784.0) 01/21/2012   Hx of psoriasis 01/21/2012   Weight gain 01/21/2012   Ankylosing  spondylitis  possible 09/03/2011   Hx of transfusion 09/03/2011   S/P lumbar fusion 6 13 09/03/2011   Lumbar stenosis with neurogenic claudication 08/05/2011   ALLERGIC RHINITIS 02/19/2010   GERD 02/19/2010   SPINAL STENOSIS, CERVICAL 02/19/2010   OSTEOPENIA 02/19/2010   IBS 04/28/2007   History of gastric ulcer  02/09/2007   COLONIC POLYPS, HX OF 02/09/2007   CHOLECYSTECTOMY, HX OF 02/09/2007   Hyperlipidemia 12/29/2006   Essential hypertension 12/29/2006    Past Medical History:  Diagnosis Date   Anemia    iron def   Arthritis    rheumatoid, psoriatic, osteoarthritis    Bone spur    RIGHT FOOT    Broken toe    Bruises easily    Cellulitis    Chronic back pain    COLONIC POLYPS, HX OF 02/09/2007   Cough, persistent 01/21/2012   poss from acei    DES exposure in utero, unknown    Femur fracture, left (HCC)    Fibromyalgia    doesn't require meds   GASTRIC ULCER, ACUTE, HEMORRHAGE, HX OF 02/09/2007   GERD (gastroesophageal reflux disease)    takes Nexium daily   Goiter    H/O hiatal hernia    H/O: hysterectomy    when 72 yrs old   Hx of seasonal allergies    takes Claritin daily   HYPERLIPIDEMIA 12/29/2006   takes Lovastatin nightly   HYPERTENSION 12/29/2006   takes Enalapril daily   IBS 04/28/2007   Insomnia    related to pain;takes Flexeril and Tylenol PM nightly   Joint pain    Joint swelling    Neuropathy    small cell   Osteopenia    Palpitations 02/09/2007   PONV (postoperative nausea and vomiting)    NAUSEA   REDUCTION MAMMOPLASTY, HX OF 02/09/2007   Right bundle branch block 02/09/2007   S/P lumbar fusion 6 13 09/03/2011   L4 L5  posterior    SYNCOPE 12/07/2007   Tendonitis    RLE    UNS ADVRS EFF OTH RX MEDICINAL\T\BIOLOGICAL SBSTNC 02/09/2007    Family History  Problem Relation Age of Onset   Diabetes Mother    Colon cancer Mother        x2   Arthritis Mother    Hypertension Mother    Endometrial cancer Mother    Stroke Father    Heart disease Father    Colon cancer Father    Arthritis Brother    Hypertension Brother    Hyperlipidemia Brother    Infertility Brother    Diabetes Brother    Other Other        DISH brother   Hypertension Maternal Grandmother    Stroke Maternal Grandmother    Anesthesia problems Neg Hx    Hypotension Neg Hx     Malignant hyperthermia Neg Hx    Pseudochol deficiency Neg Hx    Past Surgical History:  Procedure Laterality Date   BACK SURGERY  12+yrs ago   Synovial Cyst removal   BACK SURGERY  6/13   lumbar L4-L5 fusion, lamenectomy   bone spur  6-69yr ago   right ankle   BUNIONECTOMY  10/03/14   right foot   CERVICAL FUSION     2 12    CHOLECYSTECTOMY  1996   HARDWARE REMOVAL Left 08/01/2018   Procedure: Left knee hardware removal;  Surgeon: AGaynelle Arabian MD;  Location: [facility];  Service: Orthopedics;  Laterality: Left;  341m   JOINT REPLACEMENT  KNEE ARTHROPLASTY     POSTERIOR CERVICAL LAMINECTOMY Left 07/30/2015   Procedure: Laminectomy and Foraminotomy - left - Cervical two -Cervical three;  Surgeon: Earnie Larsson, MD;  Location: [facility];  Service: Neurosurgery;  Laterality: Left;   REDUCTION MAMMAPLASTY Bilateral 1989   TONSILLECTOMY     as a child   TOTAL ABDOMINAL HYSTERECTOMY W/ BILATERAL SALPINGOOPHORECTOMY  1986   TOTAL KNEE ARTHROPLASTY N/A 06/21/2017   Procedure: LEFT TOTAL KNEE ARTHROPLASTY AND RIGHT KNEE CORTISONE INJECTION;  Surgeon: Gaynelle Arabian, MD;  Location: [facility];  Service: Orthopedics;  Laterality: N/A;   Social History   Social History Narrative   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  Husband died suddenly 2010Non smoker         Left handed    Lives in a one story home          Immunization History  Administered Date(s) Administered   Fluad Quad(high Dose 65+) 11/04/2018, 11/07/2019, 11/21/2020, 11/11/2021   Influenza Split 11/29/2012, 11/27/2013   Influenza Whole 11/09/2008   Influenza, High Dose Seasonal PF 11/26/2015, 11/30/2016, 11/03/2017, 12/05/2020   Influenza,inj,Quad PF,6+ Mos 11/27/2014   Influenza-Unspecified 12/22/2016   PFIZER Comirnaty(Gray Top)Covid-19 Tri-Sucrose Vaccine 03/28/2020, 11/24/2021   PFIZER(Purple Top)SARS-COV-2 Vaccination 04/08/2019, 05/03/2019, 10/19/2019,  03/28/2020   PPD Test 12/18/2013, 11/27/2014, 11/26/2015, 11/30/2016   Pfizer Covid-19 Vaccine Bivalent Booster 23yr & up 11/21/2020   Pneumococcal Conjugate-13 02/28/2013   Pneumococcal Polysaccharide-23 03/03/2003, 02/28/2014, 11/07/2019   Respiratory Syncytial Virus Vaccine,Recomb Aduvanted(Arexvy) 12/12/2021   Td 05/09/2008   Tdap 07/19/2018   Zoster Recombinat (Shingrix) 12/03/2016, 02/04/2017   Zoster, Live 01/20/2011     Objective: Vital Signs: BP 126/64 (BP Location: Left Arm, Patient Position: Sitting, Cuff Size: Large)   Pulse 75   Resp 16   Ht '5\' 4"'$  (1.626 m)   Wt 198 lb (89.8 kg)   LMP 03/02/1984   BMI 33.99 kg/m    Physical Exam Vitals and nursing note reviewed.  Constitutional:      Appearance: She is well-developed.  HENT:     Head: Normocephalic and atraumatic.  Eyes:     Conjunctiva/sclera: Conjunctivae normal.  Cardiovascular:     Rate and Rhythm: Normal rate and regular rhythm.     Heart sounds: Normal heart sounds.  Pulmonary:     Effort: Pulmonary effort is normal.     Breath sounds: Normal breath sounds.  Abdominal:     General: Bowel sounds are normal.     Palpations: Abdomen is soft.  Musculoskeletal:     Cervical back: Normal range of motion.  Lymphadenopathy:     Cervical: No cervical adenopathy.  Skin:    General: Skin is warm and dry.     Capillary Refill: Capillary refill takes less than 2 seconds.  Neurological:     Mental Status: She is alert and oriented to person, place, and time.  Psychiatric:        Behavior: Behavior normal.      Musculoskeletal Exam: She had limited painful range of motion of the cervical spine.  She had thoracic kyphosis.  She had limited range of motion of the lumbar spine without discomfort.  Shoulders, elbows, wrist joints with good range of motion.  She had no synovitis over MCPs PIPs or DIPs.  Bilateral PIP and DIP thickening was noted.  Hip joints and knee joints in good range of motion.  Left knee  joint was replaced.  There was no tenderness  over ankles or MTPs.  CDAI Exam: CDAI Score: -- Patient Global: --; Provider Global: -- Swollen: --; Tender: -- Joint Exam 03/24/2022   No joint exam has been documented for this visit   There is currently no information documented on the homunculus. Go to the Rheumatology activity and complete the homunculus joint exam.  Investigation: No additional findings.  Imaging: No results found.  Recent Labs: Lab Results  Component Value Date   WBC 5.5 01/28/2022   HGB 13.2 01/28/2022   PLT 315 01/28/2022   NA 140 01/28/2022   K 5.1 01/28/2022   CL 102 01/28/2022   CO2 22 01/28/2022   GLUCOSE 92 01/28/2022   BUN 22 01/28/2022   CREATININE 0.84 01/28/2022   BILITOT 0.7 01/28/2022   ALKPHOS 100 01/28/2022   AST 19 01/28/2022   ALT 16 01/28/2022   PROT 7.4 01/28/2022   ALBUMIN 4.8 01/28/2022   CALCIUM 9.8 01/28/2022   GFRAA 87 02/02/2020   QFTBGOLDPLUS NEGATIVE 10/01/2021    Speciality Comments: Failed therapy: Arava and Otezla due to diarrhea Enbrel, Humira-neuropathy  Procedures:  No procedures performed Allergies: Erythromycin, Iodinated contrast media, Ace inhibitors, Indomethacin, Keflex [cephalexin], and Macrobid [nitrofurantoin]   Assessment / Plan:     Visit Diagnoses: Psoriatic arthritis (HCC)-she continues to have some discomfort in her joints.  No synovitis was noted.  She has been taking Simponi and methotrexate on a regular basis.  Her pain is mostly in the cervical spine.  She has stiffness in other joints.  Hx of psoriasis-she has been having more flares of psoriasis during the winter months.  She has been using topical agents and moisturizers.  High risk medication use - Simponi 50 mg every 28 days and methotrexate 0.3 ml every 7 days folic acid 1 mg p.o. daily..([place] caused diarrhea).  Labs obtained on January 28, 2022 CBC and CMP were reviewed which were within normal limits.  TB Gold was negative  on October 01, 2021.  She was advised to get labs every 3 months.  She was advised to hold methotrexate and Simponi if she develops an infection and resume after the infection resolves.  Information on immunization was placed in the AVS.  Annual skin examination was advised to screen for skin cancer.  Primary osteoarthritis of both hands-she had bilateral PIP and DIP thickening with no synovitis.  Primary osteoarthritis of right knee-she has intermittent disc offered.  No warmth swelling or effusion was noted.  Total knee replacement status, left - by Dr. Wynelle Link.  Doing well she still has some discomfort.  Primary osteoarthritis of both feet-proper fitting shoes were advised.  DDD (degenerative disc disease), cervical - S/P fusion x2: Patient was evaluated by Dr. Trenton Gammon who did not recommend surgery.  Stretching exercises were advised.  DDD (degenerative disc disease), lumbar - S/P fusion.  She had a recent MRI of the lumbar spine on 02/26/2021.  She has intermittent pain.  Small fiber neuropathy-she continues to have paresthesias from neuropathy.  She has been using Cymbalta which helps.  Fibromyalgia -she is on duloxetine 60 mg 1 capsule daily.  Chronic fatigue-she continues to have some fatigue.  Osteopenia of multiple sites - Followed by her GYN.  Updated DXA on 11/14/20: The BMD measured at DualFemur Neck Right is 0.829 g/cm2 with a T-score of -1.5.  Calcium rich diet with vitamin D was advised.  Other iron deficiency anemia    Orders: No orders of the defined types were placed in this encounter.  No  orders of the defined types were placed in this encounter.    Follow-Up Instructions: No follow-ups on file.   Bo Merino, MD  Note - This record has been created using Editor, commissioning.  Chart creation errors have been sought, but may not always  have been located. Such creation errors do not reflect on  the standard of medical care.

## 2022-03-14 ENCOUNTER — Other Ambulatory Visit: Payer: Self-pay | Admitting: Physician Assistant

## 2022-03-16 NOTE — Telephone Encounter (Signed)
Next Visit: 03/24/2022   Last Visit: 09/30/2021   DX: Psoriatic arthritis   Current Dose per office note 09/30/2021: methotrexate 0.3 ml every 7 days    Labs: 01/28/2022 CBC and CMP WNL    Last Fill: 12/22/2021   Okay to refill MTX?

## 2022-03-24 ENCOUNTER — Ambulatory Visit: Payer: Medicare Other | Attending: Rheumatology | Admitting: Rheumatology

## 2022-03-24 ENCOUNTER — Encounter: Payer: Self-pay | Admitting: Rheumatology

## 2022-03-24 VITALS — BP 126/64 | HR 75 | Resp 16 | Ht 64.0 in | Wt 198.0 lb

## 2022-03-24 DIAGNOSIS — M5136 Other intervertebral disc degeneration, lumbar region: Secondary | ICD-10-CM | POA: Insufficient documentation

## 2022-03-24 DIAGNOSIS — M19041 Primary osteoarthritis, right hand: Secondary | ICD-10-CM | POA: Insufficient documentation

## 2022-03-24 DIAGNOSIS — M19072 Primary osteoarthritis, left ankle and foot: Secondary | ICD-10-CM | POA: Diagnosis not present

## 2022-03-24 DIAGNOSIS — Z872 Personal history of diseases of the skin and subcutaneous tissue: Secondary | ICD-10-CM | POA: Insufficient documentation

## 2022-03-24 DIAGNOSIS — M5030 Other cervical disc degeneration, unspecified cervical region: Secondary | ICD-10-CM | POA: Diagnosis not present

## 2022-03-24 DIAGNOSIS — M797 Fibromyalgia: Secondary | ICD-10-CM | POA: Diagnosis not present

## 2022-03-24 DIAGNOSIS — Z79899 Other long term (current) drug therapy: Secondary | ICD-10-CM | POA: Diagnosis not present

## 2022-03-24 DIAGNOSIS — M1711 Unilateral primary osteoarthritis, right knee: Secondary | ICD-10-CM | POA: Insufficient documentation

## 2022-03-24 DIAGNOSIS — M8589 Other specified disorders of bone density and structure, multiple sites: Secondary | ICD-10-CM | POA: Insufficient documentation

## 2022-03-24 DIAGNOSIS — L4050 Arthropathic psoriasis, unspecified: Secondary | ICD-10-CM | POA: Insufficient documentation

## 2022-03-24 DIAGNOSIS — M19042 Primary osteoarthritis, left hand: Secondary | ICD-10-CM | POA: Insufficient documentation

## 2022-03-24 DIAGNOSIS — G629 Polyneuropathy, unspecified: Secondary | ICD-10-CM | POA: Diagnosis not present

## 2022-03-24 DIAGNOSIS — D508 Other iron deficiency anemias: Secondary | ICD-10-CM | POA: Insufficient documentation

## 2022-03-24 DIAGNOSIS — M19071 Primary osteoarthritis, right ankle and foot: Secondary | ICD-10-CM | POA: Diagnosis not present

## 2022-03-24 DIAGNOSIS — W57XXXD Bitten or stung by nonvenomous insect and other nonvenomous arthropods, subsequent encounter: Secondary | ICD-10-CM

## 2022-03-24 DIAGNOSIS — R5382 Chronic fatigue, unspecified: Secondary | ICD-10-CM | POA: Insufficient documentation

## 2022-03-24 DIAGNOSIS — Z96652 Presence of left artificial knee joint: Secondary | ICD-10-CM | POA: Insufficient documentation

## 2022-03-24 NOTE — Patient Instructions (Addendum)
Standing Labs We placed an order today for your standing lab work.   Please have your standing labs drawn in February and every 3 months  Please have your labs drawn 2 weeks prior to your appointment so that the provider can discuss your lab results at your appointment.  Please note that you may see your imaging and lab results in [place] [facility] before we have reviewed them. We will contact you once all results are reviewed. Please allow our office up to 72 hours to thoroughly review all of the results before contacting the office for clarification of your results.  Lab hours are:   Monday through Thursday from 8:00 am -12:30 pm and 1:00 pm-5:00 pm and Friday from 8:00 am-12:00 pm.  Please be advised, all patients with office appointments requiring lab work will take precedent over walk-in lab work.   Labs are drawn by [facility]. Please bring your co-pay at the time of your lab draw.  You may receive a bill from [facility] for your lab work.  Please note if you are on Hydroxychloroquine and and an order has been placed for a Hydroxychloroquine level, you will need to have it drawn 4 hours or more after your last dose.  If you wish to have your labs drawn at another location, please call the office 24 hours in advance so we can fax the orders.  The office is located at [address], [place], [place], [place] [address] No appointment is necessary.    If you have any questions regarding directions or hours of operation,  please call [phone].   As a reminder, please drink plenty of water prior to coming for your lab work. Thanks!   Vaccines You are taking a medication(s) that can suppress your immune system.  The following immunizations are recommended: Flu annually Covid-19 RSV Td/Tdap (tetanus, diphtheria, pertussis) every 10 years Pneumonia (Prevnar 15 then Pneumovax 23 at least 1 year apart.  Alternatively, can take Prevnar 20 without needing additional dose) Shingrix: 2 doses from 4  weeks to 6 months apart  Please check with your PCP to make sure you are up to date.   If you have signs or symptoms of an infection or start antibiotics: First, call your PCP for workup of your infection. Hold your medication through the infection, until you complete your antibiotics, and until symptoms resolve if you take the following: Injectable medication (Actemra, Benlysta, Cimzia, Cosentyx, Enbrel, Humira, Kevzara, Orencia, Remicade, Simponi, Stelara, Taltz, Tremfya) Methotrexate Leflunomide (Arava) Mycophenolate (Cellcept) Morrie Sheldon, Olumiant, or Rinvoq   Please get an annual skin examination to screen for skin cancer while you are on Simponi.

## 2022-04-04 ENCOUNTER — Other Ambulatory Visit: Payer: Self-pay | Admitting: Physician Assistant

## 2022-04-06 ENCOUNTER — Other Ambulatory Visit: Payer: Self-pay | Admitting: Podiatry

## 2022-04-06 ENCOUNTER — Other Ambulatory Visit: Payer: Self-pay | Admitting: Internal Medicine

## 2022-04-22 ENCOUNTER — Telehealth: Payer: Self-pay | Admitting: Rheumatology

## 2022-04-22 DIAGNOSIS — Z79899 Other long term (current) drug therapy: Secondary | ICD-10-CM

## 2022-04-22 NOTE — Telephone Encounter (Signed)
Patient called the office requesting lab orders be sent to [facility] on [address]. Patient wishes to go at 9:30.

## 2022-04-22 NOTE — Telephone Encounter (Signed)
Lab Orders released.  

## 2022-04-23 LAB — CBC WITH DIFFERENTIAL/PLATELET
Basophils Absolute: 0.1 10*3/uL (ref 0.0–0.2)
Basos: 1 %
EOS (ABSOLUTE): 0.1 10*3/uL (ref 0.0–0.4)
Eos: 2 %
Hematocrit: 30.7 % — ABNORMAL LOW (ref 34.0–46.6)
Hemoglobin: 10.4 g/dL — ABNORMAL LOW (ref 11.1–15.9)
Immature Grans (Abs): 0 10*3/uL (ref 0.0–0.1)
Immature Granulocytes: 0 %
Lymphocytes Absolute: 1.6 10*3/uL (ref 0.7–3.1)
Lymphs: 32 %
MCH: 31.5 pg (ref 26.6–33.0)
MCHC: 33.9 g/dL (ref 31.5–35.7)
MCV: 93 fL (ref 79–97)
Monocytes Absolute: 0.4 10*3/uL (ref 0.1–0.9)
Monocytes: 7 %
Neutrophils Absolute: 2.9 10*3/uL (ref 1.4–7.0)
Neutrophils: 58 %
Platelets: 414 10*3/uL (ref 150–450)
RBC: 3.3 x10E6/uL — ABNORMAL LOW (ref 3.77–5.28)
RDW: 13.3 % (ref 11.7–15.4)
WBC: 5 10*3/uL (ref 3.4–10.8)

## 2022-04-23 LAB — CMP14+EGFR
ALT: 14 IU/L (ref 0–32)
AST: 15 IU/L (ref 0–40)
Albumin/Globulin Ratio: 1.7 (ref 1.2–2.2)
Albumin: 4.5 g/dL (ref 3.9–4.9)
Alkaline Phosphatase: 104 IU/L (ref 44–121)
BUN/Creatinine Ratio: 29 — ABNORMAL HIGH (ref 12–28)
BUN: 21 mg/dL (ref 8–27)
Bilirubin Total: 0.4 mg/dL (ref 0.0–1.2)
CO2: 22 mmol/L (ref 20–29)
Calcium: 10 mg/dL (ref 8.7–10.3)
Chloride: 103 mmol/L (ref 96–106)
Creatinine, Ser: 0.72 mg/dL (ref 0.57–1.00)
Globulin, Total: 2.6 g/dL (ref 1.5–4.5)
Glucose: 76 mg/dL (ref 70–99)
Potassium: 5.2 mmol/L (ref 3.5–5.2)
Sodium: 139 mmol/L (ref 134–144)
Total Protein: 7.1 g/dL (ref 6.0–8.5)
eGFR: 90 mL/min/{1.73_m2} (ref >59)

## 2022-04-23 NOTE — Progress Notes (Signed)
CMP was normal.  Hemoglobin was low at 10.4.  Should avoid meloxicam and any other NSAIDs.  She should see her PCP for the evaluation of anemia.  Please notify patient and forward results to her PCP.

## 2022-04-24 ENCOUNTER — Encounter: Payer: Self-pay | Admitting: Hematology

## 2022-04-28 ENCOUNTER — Telehealth: Payer: Self-pay | Admitting: Hematology

## 2022-04-28 NOTE — Telephone Encounter (Signed)
Per 2/20 IB reached out to patient to move to telephone visit, patient aware.

## 2022-05-05 ENCOUNTER — Telehealth: Payer: Self-pay | Admitting: Internal Medicine

## 2022-05-05 NOTE — Telephone Encounter (Signed)
Contacted [name] to schedule their annual wellness visit. Appointment made for 05/08/22.  Jade Cardenas AWV direct phone # [phone]

## 2022-05-07 ENCOUNTER — Other Ambulatory Visit: Payer: Self-pay

## 2022-05-07 ENCOUNTER — Other Ambulatory Visit: Payer: Self-pay | Admitting: Internal Medicine

## 2022-05-07 DIAGNOSIS — D509 Iron deficiency anemia, unspecified: Secondary | ICD-10-CM

## 2022-05-08 ENCOUNTER — Ambulatory Visit (INDEPENDENT_AMBULATORY_CARE_PROVIDER_SITE_OTHER): Payer: Medicare Other

## 2022-05-08 ENCOUNTER — Inpatient Hospital Stay: Payer: Medicare Other | Attending: Hematology

## 2022-05-08 VITALS — Ht 64.0 in | Wt 190.0 lb

## 2022-05-08 DIAGNOSIS — Z0000 Encounter for general adult medical examination without abnormal findings: Secondary | ICD-10-CM

## 2022-05-08 DIAGNOSIS — K219 Gastro-esophageal reflux disease without esophagitis: Secondary | ICD-10-CM | POA: Insufficient documentation

## 2022-05-08 DIAGNOSIS — Z79899 Other long term (current) drug therapy: Secondary | ICD-10-CM | POA: Insufficient documentation

## 2022-05-08 DIAGNOSIS — G8929 Other chronic pain: Secondary | ICD-10-CM | POA: Insufficient documentation

## 2022-05-08 DIAGNOSIS — M797 Fibromyalgia: Secondary | ICD-10-CM | POA: Diagnosis not present

## 2022-05-08 DIAGNOSIS — M8580 Other specified disorders of bone density and structure, unspecified site: Secondary | ICD-10-CM | POA: Insufficient documentation

## 2022-05-08 DIAGNOSIS — M1990 Unspecified osteoarthritis, unspecified site: Secondary | ICD-10-CM | POA: Insufficient documentation

## 2022-05-08 DIAGNOSIS — R61 Generalized hyperhidrosis: Secondary | ICD-10-CM | POA: Diagnosis not present

## 2022-05-08 DIAGNOSIS — Z8719 Personal history of other diseases of the digestive system: Secondary | ICD-10-CM | POA: Insufficient documentation

## 2022-05-08 DIAGNOSIS — D509 Iron deficiency anemia, unspecified: Secondary | ICD-10-CM | POA: Diagnosis not present

## 2022-05-08 DIAGNOSIS — R5383 Other fatigue: Secondary | ICD-10-CM | POA: Insufficient documentation

## 2022-05-08 DIAGNOSIS — E538 Deficiency of other specified B group vitamins: Secondary | ICD-10-CM | POA: Diagnosis not present

## 2022-05-08 DIAGNOSIS — R509 Fever, unspecified: Secondary | ICD-10-CM | POA: Insufficient documentation

## 2022-05-08 LAB — CBC WITH DIFFERENTIAL (CANCER CENTER ONLY)
Abs Immature Granulocytes: 0.01 10*3/uL (ref 0.00–0.07)
Basophils Absolute: 0.1 10*3/uL (ref 0.0–0.1)
Basophils Relative: 1 %
Eosinophils Absolute: 0.1 10*3/uL (ref 0.0–0.5)
Eosinophils Relative: 1 %
HCT: 31 % — ABNORMAL LOW (ref 36.0–46.0)
Hemoglobin: 10.7 g/dL — ABNORMAL LOW (ref 12.0–15.0)
Immature Granulocytes: 0 %
Lymphocytes Relative: 34 %
Lymphs Abs: 1.7 10*3/uL (ref 0.7–4.0)
MCH: 31.9 pg (ref 26.0–34.0)
MCHC: 34.5 g/dL (ref 30.0–36.0)
MCV: 92.5 fL (ref 80.0–100.0)
Monocytes Absolute: 0.4 10*3/uL (ref 0.1–1.0)
Monocytes Relative: 9 %
Neutro Abs: 2.8 10*3/uL (ref 1.7–7.7)
Neutrophils Relative %: 55 %
Platelet Count: 413 10*3/uL — ABNORMAL HIGH (ref 150–400)
RBC: 3.35 MIL/uL — ABNORMAL LOW (ref 3.87–5.11)
RDW: 13.2 % (ref 11.5–15.5)
WBC Count: 5.1 10*3/uL (ref 4.0–10.5)
nRBC: 0 % (ref 0.0–0.2)

## 2022-05-08 LAB — CMP (CANCER CENTER ONLY)
ALT: 12 U/L (ref 0–44)
AST: 15 U/L (ref 15–41)
Albumin: 4.3 g/dL (ref 3.5–5.0)
Alkaline Phosphatase: 82 U/L (ref 38–126)
Anion gap: 7 (ref 5–15)
BUN: 22 mg/dL (ref 8–23)
CO2: 27 mmol/L (ref 22–32)
Calcium: 9.5 mg/dL (ref 8.9–10.3)
Chloride: 105 mmol/L (ref 98–111)
Creatinine: 0.78 mg/dL (ref 0.44–1.00)
GFR, Estimated: >60 mL/min (ref >60)
Glucose, Bld: 118 mg/dL — ABNORMAL HIGH (ref 70–99)
Potassium: 4.3 mmol/L (ref 3.5–5.1)
Sodium: 139 mmol/L (ref 135–145)
Total Bilirubin: 0.6 mg/dL (ref 0.3–1.2)
Total Protein: 7.4 g/dL (ref 6.5–8.1)

## 2022-05-08 LAB — IRON AND IRON BINDING CAPACITY (CC-WL,HP ONLY)
Iron: 41 ug/dL (ref 28–170)
Saturation Ratios: 9 % — ABNORMAL LOW (ref 10.4–31.8)
TIBC: 466 ug/dL — ABNORMAL HIGH (ref 250–450)
UIBC: 425 ug/dL (ref 148–442)

## 2022-05-08 LAB — FERRITIN: Ferritin: 11 ng/mL (ref 11–307)

## 2022-05-08 NOTE — Patient Instructions (Addendum)
Jade Cardenas , Thank you for taking time to come for your Medicare Wellness Visit. I appreciate your ongoing commitment to your health goals. Please review the following plan we discussed and let me know if I can assist you in the future.   These are the goals we discussed:  Goals       Gain weight (pt-stated)      Would like to join gym,        This is a list of the screening recommended for you and due dates:  Health Maintenance  Topic Date Due   COVID-19 Vaccine (8 - 2023-24 season) 05/24/2022*   Medicare Annual Wellness Visit  05/08/2023   Mammogram  11/22/2023   DTaP/Tdap/Td vaccine (3 - Td or Tdap) 07/18/2028   Colon Cancer Screening  11/13/2030   Pneumonia Vaccine  Completed   Flu Shot  Completed   DEXA scan (bone density measurement)  Completed   Hepatitis C Screening: USPSTF Recommendation to screen - Ages 10-79 yo.  Completed   Zoster (Shingles) Vaccine  Completed   HPV Vaccine  Aged Out  *Topic was postponed. The date shown is not the original due date.    Advanced directives: Please bring a copy of your health care power of attorney and living will to the office to be added to your chart at your convenience.   Conditions/risks identified: None  Next appointment: Follow up in one year for your annual wellness visit    Preventive Care 65 Years and Older, Female Preventive care refers to lifestyle choices and visits with your health care provider that can promote health and wellness. What does preventive care include? A yearly physical exam. This is also called an annual well check. Dental exams once or twice a year. Routine eye exams. Ask your health care provider how often you should have your eyes checked. Personal lifestyle choices, including: Daily care of your teeth and gums. Regular physical activity. Eating a healthy diet. Avoiding tobacco and drug use. Limiting alcohol use. Practicing safe sex. Taking low-dose aspirin every day. Taking vitamin and  mineral supplements as recommended by your health care provider. What happens during an annual well check? The services and screenings done by your health care provider during your annual well check will depend on your age, overall health, lifestyle risk factors, and family history of disease. Counseling  Your health care provider may ask you questions about your: Alcohol use. Tobacco use. Drug use. Emotional well-being. Home and relationship well-being. Sexual activity. Eating habits. History of falls. Memory and ability to understand (cognition). Work and work Statistician. Reproductive health. Screening  You may have the following tests or measurements: Height, weight, and BMI. Blood pressure. Lipid and cholesterol levels. These may be checked every 5 years, or more frequently if you are over 34 years old. Skin check. Lung cancer screening. You may have this screening every year starting at age 34 if you have a 30-pack-year history of smoking and currently smoke or have quit within the past 15 years. Fecal occult blood test (FOBT) of the stool. You may have this test every year starting at age 15. Flexible sigmoidoscopy or colonoscopy. You may have a sigmoidoscopy every 5 years or a colonoscopy every 10 years starting at age 42. Hepatitis C blood test. Hepatitis B blood test. Sexually transmitted disease (STD) testing. Diabetes screening. This is done by checking your blood sugar (glucose) after you have not eaten for a while (fasting). You may have this done every 1-3 years.  Bone density scan. This is done to screen for osteoporosis. You may have this done starting at age 76. Mammogram. This may be done every 1-2 years. Talk to your health care provider about how often you should have regular mammograms. Talk with your health care provider about your test results, treatment options, and if necessary, the need for more tests. Vaccines  Your health care provider may recommend  certain vaccines, such as: Influenza vaccine. This is recommended every year. Tetanus, diphtheria, and acellular pertussis (Tdap, Td) vaccine. You may need a Td booster every 10 years. Zoster vaccine. You may need this after age 92. Pneumococcal 13-valent conjugate (PCV13) vaccine. One dose is recommended after age 36. Pneumococcal polysaccharide (PPSV23) vaccine. One dose is recommended after age 23. Talk to your health care provider about which screenings and vaccines you need and how often you need them. This information is not intended to replace advice given to you by your health care provider. Make sure you discuss any questions you have with your health care provider. Document Released: 03/15/2015 Document Revised: 11/06/2015 Document Reviewed: 12/18/2014 Elsevier Interactive Patient Education  2017 [employer] Prevention in the Home Falls can cause injuries. They can happen to people of all ages. There are many things you can do to make your home safe and to help prevent falls. What can I do on the outside of my home? Regularly fix the edges of walkways and driveways and fix any cracks. Remove anything that might make you trip as you walk through a door, such as a raised step or threshold. Trim any bushes or trees on the path to your home. Use bright outdoor lighting. Clear any walking paths of anything that might make someone trip, such as rocks or tools. Regularly check to see if handrails are loose or broken. Make sure that both sides of any steps have handrails. Any raised decks and porches should have guardrails on the edges. Have any leaves, snow, or ice cleared regularly. Use sand or salt on walking paths during winter. Clean up any spills in your garage right away. This includes oil or grease spills. What can I do in the bathroom? Use night lights. Install grab bars by the toilet and in the tub and shower. Do not use towel bars as grab bars. Use non-skid mats or  decals in the tub or shower. If you need to sit down in the shower, use a plastic, non-slip stool. Keep the floor dry. Clean up any water that spills on the floor as soon as it happens. Remove soap buildup in the tub or shower regularly. Attach bath mats securely with double-sided non-slip rug tape. Do not have throw rugs and other things on the floor that can make you trip. What can I do in the bedroom? Use night lights. Make sure that you have a light by your bed that is easy to reach. Do not use any sheets or blankets that are too big for your bed. They should not hang down onto the floor. Have a firm chair that has side arms. You can use this for support while you get dressed. Do not have throw rugs and other things on the floor that can make you trip. What can I do in the kitchen? Clean up any spills right away. Avoid walking on wet floors. Keep items that you use a lot in easy-to-reach places. If you need to reach something above you, use a strong step stool that has a grab bar.  Keep electrical cords out of the way. Do not use floor polish or wax that makes floors slippery. If you must use wax, use non-skid floor wax. Do not have throw rugs and other things on the floor that can make you trip. What can I do with my stairs? Do not leave any items on the stairs. Make sure that there are handrails on both sides of the stairs and use them. Fix handrails that are broken or loose. Make sure that handrails are as long as the stairways. Check any carpeting to make sure that it is firmly attached to the stairs. Fix any carpet that is loose or worn. Avoid having throw rugs at the top or bottom of the stairs. If you do have throw rugs, attach them to the floor with carpet tape. Make sure that you have a light switch at the top of the stairs and the bottom of the stairs. If you do not have them, ask someone to add them for you. What else can I do to help prevent falls? Wear shoes that: Do not  have high heels. Have rubber bottoms. Are comfortable and fit you well. Are closed at the toe. Do not wear sandals. If you use a stepladder: Make sure that it is fully opened. Do not climb a closed stepladder. Make sure that both sides of the stepladder are locked into place. Ask someone to hold it for you, if possible. Clearly mark and make sure that you can see: Any grab bars or handrails. First and last steps. Where the edge of each step is. Use tools that help you move around (mobility aids) if they are needed. These include: Canes. Walkers. Scooters. Crutches. Turn on the lights when you go into a dark area. Replace any light bulbs as soon as they burn out. Set up your furniture so you have a clear path. Avoid moving your furniture around. If any of your floors are uneven, fix them. If there are any pets around you, be aware of where they are. Review your medicines with your doctor. Some medicines can make you feel dizzy. This can increase your chance of falling. Ask your doctor what other things that you can do to help prevent falls. This information is not intended to replace advice given to you by your health care provider. Make sure you discuss any questions you have with your health care provider. Document Released: 12/13/2008 Document Revised: 07/25/2015 Document Reviewed: 03/23/2014 Elsevier Interactive Patient Education  2017 [employer].

## 2022-05-08 NOTE — Progress Notes (Signed)
Subjective:   Jade Cardenas is a 72 y.o. female who presents for Medicare Annual (Subsequent) preventive examination.  Review of Systems    Virtual Visit via Telephone Note  I connected with  Jade Cardenas on 05/08/22 at  3:30 PM EST by telephone and verified that I am speaking with the correct person using two identifiers.  Location: Patient: Home Provider: Office Persons participating in the virtual visit: patient/Nurse Health Advisor   I discussed the limitations, risks, security and privacy concerns of performing an evaluation and management service by telephone and the availability of in person appointments. The patient expressed understanding and agreed to proceed.  Interactive audio and video telecommunications were attempted between this nurse and patient, however failed, due to patient having technical difficulties OR patient did not have access to video capability.  We continued and completed visit with audio only.  Some vital signs may be absent or patient reported.   Criselda Peaches, LPN  Cardiac Risk Factors include: advanced age (>84mn, >>21women);hypertension     Objective:    Today's Vitals   05/08/22 1541  Weight: 190 lb (86.2 kg)  Height: '5\' 4"'$  (1.626 m)   Body mass index is 32.61 kg/m.     05/08/2022    3:53 PM 12/17/2021   10:21 AM 09/04/2021   11:58 AM 04/16/2021    8:35 AM 01/31/2021    2:49 PM 04/08/2020    3:21 PM 08/01/2018   11:54 AM  Advanced Directives  Does Patient Have a Medical Advance Directive? Yes Yes Yes Yes Yes Yes Yes  Type of AParamedicof AGolden MeadowLiving will HS[place]arenLiving will;Out of facility DNR (pink MOST or yellow form)  [place]Living will [place]Living will;Out of facility DNR (pink MOST or yellow form) [place]Living will   Does patient want to make changes to medical advance directive?   No - Patient declined No -  Patient declined  No - Patient declined   Copy of [place] Chart? No - copy requested   No - copy requested  No - copy requested     Current Medications (verified) Outpatient Encounter Medications as of 05/08/2022  Medication Sig   acetaminophen (TYLENOL) 650 MG CR tablet Take 1,300 mg by mouth See admin instructions. Take 1300 mg in the morning and may take an additional 1300 mg dose as needed for pain   amLODipine (NORVASC) 5 MG tablet TAKE 1 TABLET BY MOUTH EVERY DAY   B-D ALLERGY SYRINGE 1CC/28G 28G X 1/2" 1 ML MISC USE AS DIRECTED ONCE WEEKLY   Calcium Carb-Cholecalciferol (CALCIUM+D3 PO) Take 1 tablet by mouth daily.   diphenhydramine-acetaminophen (TYLENOL PM) 25-500 MG TABS tablet Take 1 tablet by mouth at bedtime.   DULoxetine (CYMBALTA) 60 MG capsule TAKE 1 CAPSULE BY MOUTH EVERY DAY   losartan (COZAAR) 100 MG tablet TAKE 1 TABLET BY MOUTH EVERY DAY   lovastatin (MEVACOR) 20 MG tablet TAKE 1 TABLET BY MOUTH EVERY DAY IN THE EVENING   methotrexate 50 MG/2ML injection INJECT 0.3ML INTO THE SKIN ONCE WEEKLY   Multiple Vitamins-Minerals (MULTIVITAMIN ADULTS PO) Take 1 tablet by mouth daily.    SIMPONI 50 MG/0.5ML SOAJ INJECT '50MG'$  UNDER THE SKIN ONCE MONTHLY   [DISCONTINUED] meloxicam (MOBIC) 15 MG tablet Take 15 mg by mouth as needed.   [DISCONTINUED] omeprazole (PRILOSEC) 40 MG capsule TAKE 1 CAPSULE BY MOUTH EVERY DAY   Facility-Administered Encounter  Medications as of 05/08/2022  Medication   dexamethasone (DECADRON) injection 4 mg    Allergies (verified) Erythromycin, Iodinated contrast media, Ace inhibitors, Indomethacin, Keflex [cephalexin], and Macrobid [nitrofurantoin]   History: Past Medical History:  Diagnosis Date   Anemia    iron def   Arthritis    rheumatoid, psoriatic, osteoarthritis    Bone spur    RIGHT FOOT    Broken toe    Bruises easily    Cellulitis    Chronic back pain    COLONIC POLYPS, HX OF 02/09/2007   Cough, persistent  01/21/2012   poss from acei    DES exposure in utero, unknown    Femur fracture, left (HCC)    Fibromyalgia    doesn't require meds   GASTRIC ULCER, ACUTE, HEMORRHAGE, HX OF 02/09/2007   GERD (gastroesophageal reflux disease)    takes Nexium daily   Goiter    H/O hiatal hernia    H/O: hysterectomy    when 71 yrs old   Hx of seasonal allergies    takes Claritin daily   HYPERLIPIDEMIA 12/29/2006   takes Lovastatin nightly   HYPERTENSION 12/29/2006   takes Enalapril daily   IBS 04/28/2007   Insomnia    related to pain;takes Flexeril and Tylenol PM nightly   Joint pain    Joint swelling    Neuropathy    small cell   Osteopenia    Palpitations 02/09/2007   PONV (postoperative nausea and vomiting)    NAUSEA   REDUCTION MAMMOPLASTY, HX OF 02/09/2007   Right bundle branch block 02/09/2007   S/P lumbar fusion 6 13 09/03/2011   L4 L5  posterior    SYNCOPE 12/07/2007   Tendonitis    RLE    UNS ADVRS EFF OTH RX MEDICINAL\T\BIOLOGICAL SBSTNC 02/09/2007   Past Surgical History:  Procedure Laterality Date   BACK SURGERY  12+yrs ago   Synovial Cyst removal   BACK SURGERY  6/13   lumbar L4-L5 fusion, lamenectomy   bone spur  6-66yr ago   right ankle   BUNIONECTOMY  10/03/14   right foot   CERVICAL FUSION     2 12    CHOLECYSTECTOMY  1996   HARDWARE REMOVAL Left 08/01/2018   Procedure: Left knee hardware removal;  Surgeon: AGaynelle Arabian MD;  Location: [facility];  Service: Orthopedics;  Laterality: Left;  381m   JOINT REPLACEMENT     KNEE ARTHROPLASTY     POSTERIOR CERVICAL LAMINECTOMY Left 07/30/2015   Procedure: Laminectomy and Foraminotomy - left - Cervical two -Cervical three;  Surgeon: HeEarnie LarssonMD;  [facility];  Service: Neurosurgery;  Laterality: Left;   REDUCTION MAMMAPLASTY Bilateral 1989   TONSILLECTOMY     as a child   TOTAL ABDOMINAL HYSTERECTOMY W/ BILATERAL SALPINGOOPHORECTOMY  1986   TOTAL KNEE ARTHROPLASTY N/A 06/21/2017   Procedure: LEFT TOTAL KNEE  ARTHROPLASTY AND RIGHT KNEE CORTISONE INJECTION;  Surgeon: AlGaynelle ArabianMD;  Location: [facility];  Service: Orthopedics;  Laterality: N/A;   Family History  Problem Relation Age of Onset   Diabetes Mother    Colon cancer Mother        x2   Arthritis Mother    Hypertension Mother    Endometrial cancer Mother    Stroke Father    Heart disease Father    Colon cancer Father    Arthritis Brother    Hypertension Brother    Hyperlipidemia Brother    Infertility Brother    Diabetes  Brother    Other Other        DISH brother   Hypertension Maternal Grandmother    Stroke Maternal Grandmother    Anesthesia problems Neg Hx    Hypotension Neg Hx    Malignant hyperthermia Neg Hx    Pseudochol deficiency Neg Hx    Social History   Socioeconomic History   Marital status: Widowed    Spouse name: Not on file   Number of children: 0   Years of education: Not on file   Highest education level: Master's degree (e.g., MA, MS, MEng, MEd, MSW, MBA)  Occupational History   Not on file  Tobacco Use   Smoking status: Never   Smokeless tobacco: Never  Vaping Use   Vaping Use: Never used  Substance and Sexual Activity   Alcohol use: No    Alcohol/week: 0.0 standard drinks of alcohol   Drug use: No   Sexual activity: Not Currently    Partners: Male    Birth control/protection: Surgical    Comment: TAH/BSO  Other Topics Concern   Not on file  Social History Narrative   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  Husband died suddenly 2010Non smoker         Left handed    Lives in a one story home          Social Determinants of Health   Financial Resource Strain: [place]  (05/08/2022)   Overall Financial Resource Strain (CARDIA)    Difficulty of Paying Living Expenses: Not hard at all  Food Insecurity: No Food Insecurity (05/08/2022)   Hunger Vital Sign    Worried About Running Out of Food in the Last Year: Never true    [place] in the Last Year: Never true  Transportation Needs: No Transportation Needs (11/07/2021)   PRAPARE - Hydrologist (Medical): No    Lack of Transportation (Non-Medical): No  Physical Activity: Sufficiently Active (05/08/2022)   Exercise Vital Sign    Days of Exercise per Week: 7 days    Minutes of Exercise per Session: 60 min  Stress: No Stress Concern Present (05/08/2022)   [place]    Feeling of Stress : Not at all  Social Connections: Moderately Integrated (05/08/2022)   Social Connection and Isolation Panel [NHANES]    Frequency of Communication with Friends and Family: More than three times a week    Frequency of Social Gatherings with Friends and Family: More than three times a week    Attends Religious Services: More than 4 times per year    Active Member of [employer] or Organizations: Yes    Attends Archivist Meetings: More than 4 times per year    Marital Status: Widowed    Tobacco Counseling Counseling given: Not Answered   Clinical Intake:  Pre-visit preparation completed: Yes  Pain : No/denies pain     BMI - recorded: 32.61 Nutritional Status: BMI > 30  Obese Nutritional Risks: None Diabetes: No  How often do you need to have someone help you when you read instructions, pamphlets, or other written materials from your doctor or pharmacy?: 1 - Never  Diabetic?  No  Interpreter Needed?: No  Information entered by :: Rolene Arbour LPN   Activities of Daily Living    05/08/2022    3:49 PM 05/06/2022   11:05 AM  In  your present state of health, do you have any difficulty performing the following activities:  Hearing? 0 0  Vision? 1 1  Comment Eyes become tired. Followed by Dr Marcelline Mates   Difficulty concentrating or making decisions? 0 0  Walking or climbing stairs? 1 1  Comment Uses a cane. Followed by Orthopedic   Dressing or bathing? 0 0  Doing errands,  shopping? 0 0  Preparing Food and eating ? N N  Using the Toilet? N N  In the past six months, have you accidently leaked urine? N N  Do you have problems with loss of bowel control? N N  Managing your Medications? N N  Managing your Finances? N N  Housekeeping or managing your Housekeeping? N N    Patient Care Team: Panosh, Standley Brooking, MD as PCP - General Sabra Heck Lemmie Evens, MD (Gynecology) Bo Merino, MD as Consulting Physician (Rheumatology) Brunetta Genera, MD as Consulting Physician (Hematology) Gaynelle Arabian, MD as Consulting Physician (Orthopedic Surgery) Alda Berthold, DO as Consulting Physician (Neurology)  Indicate any recent Medical Services you may have received from other than [facility] providers in the past year (date may be approximate).     Assessment:   This is a routine wellness examination for [place].  Hearing/Vision screen Hearing Screening - Comments:: Denies hearing difficulties   Vision Screening - Comments:: Wears rx glasses and contacts- up to date with routine eye exams with  Dr Marcelline Mates  Dietary issues and exercise activities discussed: Exercise limited by: orthopedic condition(s)   Goals Addressed               This Visit's Progress     Gain weight (pt-stated)        Would like to join gym,       Depression Screen    05/08/2022    3:47 PM 11/11/2021    9:39 AM 04/16/2021    8:28 AM 12/17/2020   11:17 AM 11/04/2018    1:11 PM 11/03/2017   10:36 AM 11/30/2016    9:09 AM  PHQ 2/9 Scores  PHQ - 2 Score 0 1 0 0 0 0 0  PHQ- 9 Score  7  5       Fall Risk    05/08/2022    3:52 PM 05/06/2022   11:05 AM 12/17/2021   10:21 AM 11/11/2021    9:39 AM 11/07/2021   12:26 PM  [place] in the past year? 0 0 0 0 0  Number falls in past yr: 0 0 0 0   Injury with Fall? 0 0 0 0   Risk for fall due to : No Fall Risks   No Fall Risks   Follow up Falls prevention discussed   Falls evaluation completed     FALL RISK PREVENTION PERTAINING TO THE  HOME:  Any stairs in or around the home? No  If so, are there any without handrails? No  Home free of loose throw rugs in walkways, pet beds, electrical cords, etc? Yes  Adequate lighting in your home to reduce risk of falls? Yes   ASSISTIVE DEVICES UTILIZED TO PREVENT FALLS:  Life alert? No  Use of a cane, walker or w/c? Yes  Grab bars in the bathroom? Yes  Shower chair or bench in shower? Yes  Elevated toilet seat or a handicapped toilet? Yes   TIMED UP AND GO:  Was the test performed? No . Audio Visit   Cognitive Function:  05/08/2022    3:53 PM 04/16/2021    8:35 AM  6CIT Screen  What Year? 0 points 0 points  What month? 0 points 0 points  What time? 0 points 0 points  Count back from 20 0 points 0 points  Months in reverse 0 points 0 points  Repeat phrase 0 points 0 points  Total Score 0 points 0 points    Immunizations Immunization History  Administered Date(s) Administered   Fluad Quad(high Dose 65+) 11/04/2018, 11/07/2019, 11/21/2020, 11/11/2021   Influenza Split 11/29/2012, 11/27/2013   Influenza Whole 11/09/2008   Influenza, High Dose Seasonal PF 11/26/2015, 11/30/2016, 11/03/2017, 12/05/2020   Influenza,inj,Quad PF,6+ Mos 11/27/2014   Influenza-Unspecified 12/22/2016   PFIZER Comirnaty(Gray Top)Covid-19 Tri-Sucrose Vaccine 03/28/2020, 11/24/2021   PFIZER(Purple Top)SARS-COV-2 Vaccination 04/08/2019, 05/03/2019, 10/19/2019, 03/28/2020   PPD Test 12/18/2013, 11/27/2014, 11/26/2015, 11/30/2016   Pfizer Covid-19 Vaccine Bivalent Booster 51yr & up 11/21/2020   Pneumococcal Conjugate-13 02/28/2013   Pneumococcal Polysaccharide-23 03/03/2003, 02/28/2014, 11/07/2019   Respiratory Syncytial Virus Vaccine,Recomb Aduvanted(Arexvy) 12/12/2021   Td 05/09/2008   Tdap 07/19/2018   Zoster Recombinat (Shingrix) 12/03/2016, 02/04/2017   Zoster, Live 01/20/2011    TDAP status: Up to date  Flu Vaccine status: Up to date  Pneumococcal vaccine status: Up to  date  Covid-19 vaccine status: Completed vaccines  Qualifies for Shingles Vaccine? Yes   Zostavax completed Yes   Shingrix Completed?: Yes  Screening Tests Health Maintenance  Topic Date Due   COVID-19 Vaccine (8 - 2023-24 season) 05/24/2022 (Originally 01/19/2022)   Medicare Annual Wellness (AWV)  05/08/2023   MAMMOGRAM  11/22/2023   DTaP/Tdap/Td (3 - Td or Tdap) 07/18/2028   COLONOSCOPY (Pts 45-446yrInsurance coverage will need to be confirmed)  11/13/2030   Pneumonia Vaccine 70Years old  Completed   INFLUENZA VACCINE  Completed   DEXA SCAN  Completed   Hepatitis C Screening  Completed   Zoster Vaccines- Shingrix  Completed   HPV VACCINES  Aged Out    Health Maintenance  There are no preventive care reminders to display for this patient.   Colorectal cancer screening: Type of screening: Colonoscopy. Completed 10/31/20. Repeat every 10 years  Mammogram status: Completed 11/21/21. Repeat every year  Bone Density status: Completed 11/14/20. Results reflect: Bone density results: OSTEOPOROSIS. Repeat every   years.  Lung Cancer Screening: (Low Dose CT Chest recommended if Age 741-80ears, 30 pack-year currently smoking OR have quit w/in 15years.) does not qualify.     Additional Screening:  Hepatitis C Screening: does qualify; Completed 11/16/14  Vision Screening: Recommended annual ophthalmology exams for early detection of glaucoma and other disorders of the eye. Is the patient up to date with their annual eye exam?  Yes  Who is the provider or what is the name of the office in which the patient attends annual eye exams? Dr RoMarcelline Matesf pt is not established with a provider, would they like to be referred to a provider to establish care? No .   Dental Screening: Recommended annual dental exams for proper oral hygiene  Community Resource Referral / Chronic Care Management:  CRR required this visit?  No   CCM required this visit?  No      Plan:     I have  personally reviewed and noted the following in the patient's chart:   Medical and social history Use of alcohol, tobacco or illicit drugs  Current medications and supplements including opioid prescriptions. Patient is not currently taking opioid prescriptions. Functional ability and  status Nutritional status Physical activity Advanced directives List of other physicians Hospitalizations, surgeries, and ER visits in previous 12 months Vitals Screenings to include cognitive, depression, and falls Referrals and appointments  In addition, I have reviewed and discussed with patient certain preventive protocols, quality metrics, and best practice recommendations. A written personalized care plan for preventive services as well as general preventive health recommendations were provided to patient.     Criselda Peaches, LPN   [phone]   Nurse Notes: None

## 2022-05-11 ENCOUNTER — Inpatient Hospital Stay (HOSPITAL_BASED_OUTPATIENT_CLINIC_OR_DEPARTMENT_OTHER): Payer: Medicare Other | Admitting: Hematology

## 2022-05-11 DIAGNOSIS — D509 Iron deficiency anemia, unspecified: Secondary | ICD-10-CM | POA: Diagnosis not present

## 2022-05-11 DIAGNOSIS — E611 Iron deficiency: Secondary | ICD-10-CM

## 2022-05-11 DIAGNOSIS — R61 Generalized hyperhidrosis: Secondary | ICD-10-CM | POA: Diagnosis not present

## 2022-05-11 DIAGNOSIS — R509 Fever, unspecified: Secondary | ICD-10-CM | POA: Diagnosis not present

## 2022-05-11 DIAGNOSIS — M1990 Unspecified osteoarthritis, unspecified site: Secondary | ICD-10-CM | POA: Diagnosis not present

## 2022-05-11 DIAGNOSIS — G8929 Other chronic pain: Secondary | ICD-10-CM | POA: Diagnosis not present

## 2022-05-11 DIAGNOSIS — R5383 Other fatigue: Secondary | ICD-10-CM | POA: Diagnosis not present

## 2022-05-11 NOTE — Progress Notes (Signed)
Marland Kitchen    HEMATOLOGY/ONCOLOGY CLINIC NOTE  Date of Service:  05/11/22   Patient Care Team: [name], MD as PCP - General Sabra Heck Lemmie Evens, MD (Gynecology) Bo Merino, MD as Consulting Physician (Rheumatology) Brunetta Genera, MD as Consulting Physician (Hematology) Gaynelle Arabian, MD as Consulting Physician (Orthopedic Surgery) Alda Berthold, DO as Consulting Physician (Neurology)  CHIEF COMPLAINTS/PURPOSE OF CONSULTATION: f/u for Iron deficiency anemia  Diagnosis severe iron deficiency anemia + ACD (due to psoariasis) Oral iron intolerance  Treatment IV Iron as needed (received IV feraheme 08/29/15, 08/22/2015, 06/04/2015, 05/28/2015)   INTERVAL HISTORY  ..I connected with Jade Cardenas on 05/11/2022 at  3:30 PM EDT by telephone visit and verified that I am speaking with the correct person using two identifiers.   I had a phone visit with the patient on 08/26/2021 and she complained of increased fatigue, but was doing well overall.   Patient reports she has been doing well overall without any new medical concerns. She denies chills, blood in stool, black stool, other abnormal bleeding, abdominal pain, back pain, chest pain, or leg swelling. However, she does complain of increased fatigue.   Patient reports she had an episode of severe body weakness one night around a month ago which caused her to fall. She did not have any injuries from the fall. She notes that ever since then she has been having occasional fever and night sweats. Patient thinks she had an passing infection.   She notes two IV Iron infusion helps her more than 1 infusion.   Patient had an colonoscopy in the fall of 2023 which did not show any abnormalities.  I discussed the limitations, risks, security and privacy concerns of performing an evaluation and management service by telemedicine and the availability of in-person appointments. I also discussed with the patient that there may be a  patient responsible charge related to this service. The patient expressed understanding and agreed to proceed.   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 in utero, unknown    Femur fracture, left (HCC)    Fibromyalgia    doesn't require meds   GASTRIC ULCER, ACUTE, HEMORRHAGE, HX OF 02/09/2007   GERD (gastroesophageal reflux disease)    takes Nexium daily   Goiter    H/O hiatal hernia    H/O: hysterectomy    when 72 yrs old   Hx of seasonal allergies    takes Claritin daily   HYPERLIPIDEMIA 12/29/2006   takes Lovastatin nightly   HYPERTENSION 12/29/2006   takes Enalapril daily   IBS 04/28/2007   Insomnia    related to pain;takes Flexeril and Tylenol PM nightly   Joint pain    Joint swelling    Neuropathy    small cell   Osteopenia    Palpitations 02/09/2007   PONV (postoperative nausea and vomiting)    NAUSEA  REDUCTION MAMMOPLASTY, HX OF 02/09/2007   Right bundle branch block 02/09/2007   S/P lumbar fusion 6 13 09/03/2011   L4 L5  posterior    SYNCOPE 12/07/2007   Tendonitis    RLE    UNS ADVRS EFF OTH RX MEDICINAL\T\BIOLOGICAL SBSTNC 02/09/2007    SURGICAL HISTORY: Past Surgical History:  Procedure Laterality Date   BACK SURGERY  12+yrs ago   Synovial Cyst removal   BACK SURGERY  6/13   lumbar L4-L5 fusion, lamenectomy   bone spur  6-28yr ago   right ankle   BUNIONECTOMY  10/03/14   right foot   CERVICAL FUSION     2 12    CHOLECYSTECTOMY   1996   HARDWARE REMOVAL Left 08/01/2018   Procedure: Left knee hardware removal;  Surgeon: AGaynelle Arabian MD;  Location: [facility];  Service: Orthopedics;  Laterality: Left;  337m   JOINT REPLACEMENT     KNEE ARTHROPLASTY     POSTERIOR CERVICAL LAMINECTOMY Left 07/30/2015   Procedure: Laminectomy and Foraminotomy - left - Cervical two -Cervical three;  Surgeon: HeEarnie LarssonMD;  [facility];  Service: Neurosurgery;  Laterality: Left;   REDUCTION MAMMAPLASTY Bilateral 1989   TONSILLECTOMY     as a child   TOTAL ABDOMINAL HYSTERECTOMY W/ BILATERAL SALPINGOOPHORECTOMY  1986   TOTAL KNEE ARTHROPLASTY N/A 06/21/2017   Procedure: LEFT TOTAL KNEE ARTHROPLASTY AND RIGHT KNEE CORTISONE INJECTION;  Surgeon: AlGaynelle ArabianMD;  Location: [facility];  Service: Orthopedics;  Laterality: N/A;    SOCIAL HISTORY: Social History   Socioeconomic History   Marital status: Widowed    Spouse name: Not on file   Number of children: 0   Years of education: Not on file   Highest education level: Master's degree (e.g., MA, MS, MEng, MEd, MSW, MBA)  Occupational History   Not on file  Tobacco Use   Smoking status: Never   Smokeless tobacco: Never  Vaping Use   Vaping Use: Never used  Substance and Sexual Activity   Alcohol use: No    Alcohol/week: 0.0 standard drinks of alcohol   Drug use: No   Sexual activity: Not Currently    Partners: Male    Birth control/protection: Surgical    Comment: TAH/BSO  Other Topics Concern   Not on file  Social History Narrative   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  Husband died suddenly 2010Non smoker         Left handed    Lives in a one story home          Social Determinants of Health   Financial Resource Strain: [place](05/08/2022)   Overall Financial Resource Strain (CARDIA)    Difficulty of Paying Living Expenses: Not hard at all  Food Insecurity: No Food Insecurity (05/08/2022)   Hunger  Vital Sign    Worried About Running Out of Food in the Last Year: Never true    [place] the Last Year: Never true  Transportation Needs: No Transportation Needs (11/07/2021)   PRAPARE - TrHydrologistMedical): No    Lack of Transportation (Non-Medical): No  Physical Activity: Sufficiently Active (05/08/2022)   Exercise Vital Sign    Days of Exercise per Week: 7 days    Minutes of Exercise per Session: 60 min  Stress: No Stress Concern Present (05/08/2022)   [place]  Questionnaire    Feeling of Stress : Not at all  Social Connections: Moderately Integrated (05/08/2022)   Social Connection and Isolation Panel [NHANES]    Frequency of Communication with Friends and Family: More than three times a week    Frequency of Social Gatherings with Friends and Family: More than three times a week    Attends Religious Services: More than 4 times per year    Active Member of [employer] or Organizations: Yes    Attends Archivist Meetings: More than 4 times per year    Marital Status: Widowed  Intimate Partner Violence: Not At Risk (05/08/2022)   Humiliation, Afraid, Rape, and Kick questionnaire    Fear of Current or Ex-Partner: No    Emotionally Abused: No    Physically Abused: No    Sexually Abused: No    FAMILY HISTORY: Family History  Problem Relation Age of Onset   Diabetes Mother    Colon cancer Mother        x2   Arthritis Mother    Hypertension Mother    Endometrial cancer Mother    Stroke Father    Heart disease Father    Colon cancer Father    Arthritis Brother    Hypertension Brother    Hyperlipidemia Brother    Infertility Brother    Diabetes Brother    Other Other        DISH brother   Hypertension Maternal Grandmother    Stroke Maternal Grandmother    Anesthesia problems Neg Hx    Hypotension Neg Hx    Malignant hyperthermia Neg Hx    Pseudochol deficiency Neg Hx      ALLERGIES:  is allergic to erythromycin, iodinated contrast media, ace inhibitors, indomethacin, keflex [cephalexin], and macrobid [nitrofurantoin].  MEDICATIONS:  Current Outpatient Medications  Medication Sig Dispense Refill   acetaminophen (TYLENOL) 650 MG CR tablet Take 1,300 mg by mouth See admin instructions. Take 1300 mg in the morning and may take an additional 1300 mg dose as needed for pain     amLODipine (NORVASC) 5 MG tablet TAKE 1 TABLET BY MOUTH EVERY DAY 90 tablet 0   B-D ALLERGY SYRINGE 1CC/28G 28G X 1/2" 1 ML MISC USE AS DIRECTED ONCE WEEKLY 12 each 3   Calcium Carb-Cholecalciferol (CALCIUM+D3 PO) Take 1 tablet by mouth daily.     diphenhydramine-acetaminophen (TYLENOL PM) 25-500 MG TABS tablet Take 1 tablet by mouth at bedtime.     DULoxetine (CYMBALTA) 60 MG capsule TAKE 1 CAPSULE BY MOUTH EVERY DAY 90 capsule 2   losartan (COZAAR) 100 MG tablet TAKE 1 TABLET BY MOUTH EVERY DAY 90 tablet 0   lovastatin (MEVACOR) 20 MG tablet TAKE 1 TABLET BY MOUTH EVERY DAY IN THE EVENING 90 tablet 0   methotrexate 50 MG/2ML injection INJECT 0.3ML INTO THE SKIN ONCE WEEKLY 6 mL 0   Multiple Vitamins-Minerals (MULTIVITAMIN ADULTS PO) Take 1 tablet by mouth daily.      SIMPONI 50 MG/0.5ML SOAJ INJECT '50MG'$  UNDER THE SKIN ONCE MONTHLY 1.5 mL 0   Current Facility-Administered Medications  Medication Dose Route Frequency Provider Last Rate Last Admin   dexamethasone (DECADRON) injection 4 mg  4 mg Intra-articular Once Trula Slade, DPM        REVIEW OF SYSTEMS:   10 Point review of Systems was done is negative except as noted above.  PHYSICAL EXAMINATION: Telemedicine visit  LABORATORY DATA:  I have reviewed the data as listed  .  Latest Ref Rng & Units 05/08/2022   10:37 AM 04/22/2022    9:34 AM 01/28/2022   11:16 AM  CBC  WBC 4.0 - 10.5 K/uL 5.1  5.0  5.5   Hemoglobin 12.0 - 15.0 g/dL 10.7  10.4  13.2   Hematocrit 36.0 - 46.0 % 31.0  30.7  37.9   Platelets 150 - 400  K/uL 413  414  315    . CBC    Component Value Date/Time   WBC 5.1 05/08/2022 1037   WBC 5.8 09/30/2021 1204   RBC 3.35 (L) 05/08/2022 1037   HGB 10.7 (L) 05/08/2022 1037   HGB 10.4 (L) 04/22/2022 0934   HGB 13.2 10/28/2016 1029   HCT 31.0 (L) 05/08/2022 1037   HCT 30.7 (L) 04/22/2022 0934   HCT 37.5 10/28/2016 1029   PLT 413 (H) 05/08/2022 1037   PLT 414 04/22/2022 0934   MCV 92.5 05/08/2022 1037   MCV 93 04/22/2022 0934   MCV 99.5 10/28/2016 1029   MCH 31.9 05/08/2022 1037   MCHC 34.5 05/08/2022 1037   RDW 13.2 05/08/2022 1037   RDW 13.3 04/22/2022 0934   RDW 12.7 10/28/2016 1029   LYMPHSABS 1.7 05/08/2022 1037   LYMPHSABS 1.6 04/22/2022 0934   LYMPHSABS 1.9 10/28/2016 1029   MONOABS 0.4 05/08/2022 1037   MONOABS 0.3 10/28/2016 1029   EOSABS 0.1 05/08/2022 1037   EOSABS 0.1 04/22/2022 0934   BASOSABS 0.1 05/08/2022 1037   BASOSABS 0.1 04/22/2022 0934   BASOSABS 0.0 10/28/2016 1029    . Lab Results  Component Value Date   RETICCTPCT 2.27 (H) 10/28/2016   RBC 3.35 (L) 05/08/2022   RETICCTABS 85.58 10/28/2016    .    Latest Ref Rng & Units 05/08/2022   10:37 AM 04/22/2022    9:34 AM 01/28/2022   11:16 AM  CMP  Glucose 70 - 99 mg/dL 118  76  92   BUN 8 - 23 mg/dL '22  21  22   '$ Creatinine 0.44 - 1.00 mg/dL 0.78  0.72  0.84   Sodium 135 - 145 mmol/L 139  139  140   Potassium 3.5 - 5.1 mmol/L 4.3  5.2  5.1   Chloride 98 - 111 mmol/L 105  103  102   CO2 22 - 32 mmol/L '27  22  22   '$ Calcium 8.9 - 10.3 mg/dL 9.5  10.0  9.8   Total Protein 6.5 - 8.1 g/dL 7.4  7.1  7.4   Total Bilirubin 0.3 - 1.2 mg/dL 0.6  0.4  0.7   Alkaline Phos 38 - 126 U/L 82  104  100   AST 15 - 41 U/L '15  15  19   '$ ALT 0 - 44 U/L '12  14  16     '$ Lab Results  Component Value Date   FERRITIN 11 05/08/2022    RADIOGRAPHIC STUDIES: I have personally reviewed the radiological images as listed and agreed with the findings in the report. No results found.  ASSESSMENT & PLAN:   Ms.  Dunson is a pleasant 72 year old female with  #1 Severe microcytic hypochromic anemia  Due to iron deficiency. Patient's hemoglobin has responded very well to IV iron with improvement in her hemoglobin from 7.2 to 12.4 and now is stable at 13.2 with nl MCV . Ferritin levels were elevated to 800's post IV Iron and likely as an acute phase reactant.  #2 severe iron deficiency likely due to chronic GI bleeding + poor  oral iron absorption. She has a EGD and colonoscopy in 2013.  Previous history of gastric ulcer requiring blood transfusions.  Also send gastric polyps and a large hernia and prone to developing Cameron ulcers, previous colonic polyps and previous bleeding hemorrhoids.  Patient reports having recent EGD and colonoscopy with [name] in April 2017 which showed a large gastric ulcer. She reports that it was noted to be benign and Helicobacter pylori negative. The reports of the studies and the pathology are not available to [place] at this time. Patient notes that she has had f/u EGD which showed that the GU has healed - EGD read not available to [place] currently.  #3 history of poor tolerance to oral iron with significant GI distress.  She is also on chronic PPI therapy that puts her at higher risk of developing iron deficiency due to poor absorption.  And also B12 deficiency and possible vitamin D deficiency.   PLAN: -Discussed lab results from 05/08/2022 with the patient. CBC shows decreased hemoglobin of 10.7, decreased hematocrit of 31.0, and elevated platelets at 413. CMP is stable. Ferritin level is 11 ng/mL. Iron saturation is slightly decreased at 9%. Patient is anemic and iron deficient.  -schedule her for 2 doses of IV Feraheme. -recommended to continue follow-up with PCP and get an stool testing.    FOLLOW-UP: -IV Feraheme 510 mg weekly x 2 (patient wants treatment at [facility]) -Phone visit with Dr Irene Limbo in 6 months (labs 1-2 days prior to phone visit or with PCP)    The  total time spent in the appointment was 20 minutes* .  All of the patient's questions were answered with apparent satisfaction. The patient knows to call the clinic with any problems, questions or concerns.   Sullivan Lone MD MS AAHIVMS [facility] Hematology/Oncology Physician [facility]  .*Total Encounter Time as defined by the Centers for Medicare and Medicaid Services includes, in addition to the face-to-face time of a patient visit (documented in the note above) non-face-to-face time: obtaining and reviewing outside history, ordering and reviewing medications, tests or procedures, care coordination (communications with other health care professionals or caregivers) and documentation in the medical record.   Zettie Cooley, am acting as a Education administrator for Sullivan Lone, MD.

## 2022-05-11 NOTE — Progress Notes (Signed)
Chief Complaint  Patient presents with   Medical Management of Chronic Issues    Follow up on HTN.     HPI: Jade Cardenas 72 y.o. come in for Chronic disease management  Anemia back  hx of iron deficicency  saw Dr kale 10.7 low ferritin 9  needs 2 infusion.  Under rheum care for psa BP  has been ok  but sometime seems low on amlodipine 2.5  and  losartan 100  Had hx of light headed. In am  nausea.   Was hard to get up from toilet.  Not stroking  muscle weakness . Had temp 100  lasted one day poss going back on [[I omerpazoel  remote hx of gastric ulcer  but no sx of such now.   ROS: See pertinent positives and negatives per HPI.  Past Medical History:  Diagnosis Date   Anemia    iron def   Arthritis    rheumatoid, psoriatic, osteoarthritis    Bone spur    RIGHT FOOT    Broken toe    Bruises easily    Cellulitis    Chronic back pain    COLONIC POLYPS, HX OF 02/09/2007   Cough, persistent 01/21/2012   poss from acei    DES exposure in utero, unknown    Femur fracture, left (HCC)    Fibromyalgia    doesn't require meds   GASTRIC ULCER, ACUTE, HEMORRHAGE, HX OF 02/09/2007   GERD (gastroesophageal reflux disease)    takes Nexium daily   Goiter    H/O hiatal hernia    H/O: hysterectomy    when 72 yrs old   Hx of seasonal allergies    takes Claritin daily   HYPERLIPIDEMIA 12/29/2006   takes Lovastatin nightly   HYPERTENSION 12/29/2006   takes Enalapril daily   IBS 04/28/2007   Insomnia    related to pain;takes Flexeril and Tylenol PM nightly   Joint pain    Joint swelling    Neuropathy    small cell   Osteopenia    Palpitations 02/09/2007   PONV (postoperative nausea and vomiting)    NAUSEA   REDUCTION MAMMOPLASTY, HX OF 02/09/2007   Right bundle branch block 02/09/2007   S/P lumbar fusion 6 13 09/03/2011   L4 L5  posterior    SYNCOPE 12/07/2007   Tendonitis    RLE    UNS ADVRS EFF OTH RX MEDICINAL\T\BIOLOGICAL SBSTNC 02/09/2007    Family History   Problem Relation Age of Onset   Diabetes Mother    Colon cancer Mother        x2   Arthritis Mother    Hypertension Mother    Endometrial cancer Mother    Stroke Father    Heart disease Father    Colon cancer Father    Arthritis Brother    Hypertension Brother    Hyperlipidemia Brother    Infertility Brother    Diabetes Brother    Other Other        DISH brother   Hypertension Maternal Grandmother    Stroke Maternal Grandmother    Anesthesia problems Neg Hx    Hypotension Neg Hx    Malignant hyperthermia Neg Hx    Pseudochol deficiency Neg Hx     Social History   Socioeconomic History   Marital status: Widowed    Spouse name: Not on file   Number of children: 0   Years of education: Not on file   Highest education level: Master's  degree (e.g., MA, MS, MEng, MEd, MSW, MBA)  Occupational History   Not on file  Tobacco Use   Smoking status: Never   Smokeless tobacco: Never  Vaping Use   Vaping Use: Never used  Substance and Sexual Activity   Alcohol use: No    Alcohol/week: 0.0 standard drinks of alcohol   Drug use: No   Sexual activity: Not Currently    Partners: Male    Birth control/protection: Surgical    Comment: TAH/BSO  Other Topics Concern   Not on file  Social History Narrative   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  Husband died suddenly 2010Non smoker         Left handed    Lives in a one story home          Social Determinants of Health   Financial Resource Strain: [place]  (05/08/2022)   Overall Financial Resource Strain (CARDIA)    Difficulty of Paying Living Expenses: Not hard at all  Food Insecurity: No Food Insecurity (05/08/2022)   Hunger Vital Sign    Worried About Running Out of Food in the Last Year: Never true    [place] in the Last Year: Never true  Transportation Needs: No Transportation Needs (11/07/2021)   PRAPARE - Hydrologist  (Medical): No    Lack of Transportation (Non-Medical): No  Physical Activity: Sufficiently Active (05/08/2022)   Exercise Vital Sign    Days of Exercise per Week: 7 days    Minutes of Exercise per Session: 60 min  Stress: No Stress Concern Present (05/08/2022)   [place]    Feeling of Stress : Not at all  Social Connections: Moderately Integrated (05/08/2022)   Social Connection and Isolation Panel [NHANES]    Frequency of Communication with Friends and Family: More than three times a week    Frequency of Social Gatherings with Friends and Family: More than three times a week    Attends Religious Services: More than 4 times per year    Active Member of [employer] or Organizations: Yes    Attends Archivist Meetings: More than 4 times per year    Marital Status: Widowed    Outpatient Medications Prior to Visit  Medication Sig Dispense Refill   acetaminophen (TYLENOL) 650 MG CR tablet Take 1,300 mg by mouth See admin instructions. Take 1300 mg in the morning and may take an additional 1300 mg dose as needed for pain     amLODipine (NORVASC) 5 MG tablet TAKE 1 TABLET BY MOUTH EVERY DAY 90 tablet 0   B-D ALLERGY SYRINGE 1CC/28G 28G X 1/2" 1 ML MISC USE AS DIRECTED ONCE WEEKLY 12 each 3   Calcium Carb-Cholecalciferol (CALCIUM+D3 PO) Take 1 tablet by mouth daily.     diphenhydramine-acetaminophen (TYLENOL PM) 25-500 MG TABS tablet Take 1 tablet by mouth at bedtime.     DULoxetine (CYMBALTA) 60 MG capsule TAKE 1 CAPSULE BY MOUTH EVERY DAY 90 capsule 2   losartan (COZAAR) 100 MG tablet TAKE 1 TABLET BY MOUTH EVERY DAY 90 tablet 0   lovastatin (MEVACOR) 20 MG tablet TAKE 1 TABLET BY MOUTH EVERY DAY IN THE EVENING 90 tablet 0   methotrexate 50 MG/2ML injection INJECT 0.3ML INTO THE SKIN ONCE WEEKLY 6 mL 0   Multiple Vitamins-Minerals (MULTIVITAMIN ADULTS PO) Take 1 tablet by mouth daily.  SIMPONI 50 MG/0.5ML SOAJ INJECT 50MG   UNDER THE SKIN ONCE MONTHLY 1.5 mL 0   Facility-Administered Medications Prior to Visit  Medication Dose Route Frequency Provider Last Rate Last Admin   dexamethasone (DECADRON) injection 4 mg  4 mg Intra-articular Once Trula Slade, DPM         EXAM:  BP 124/60 (BP Location: Right Arm, Patient Position: Sitting, Cuff Size: Large)   Pulse 65   Temp 97.8 F (36.6 C) (Oral)   Ht 5\' 4"  (1.626 m)   Wt 196 lb 9.6 oz (89.2 kg)   LMP 03/02/1984   SpO2 96%   BMI 33.75 kg/m   Body mass index is 33.75 kg/m.  GENERAL: vitals reviewed and listed above, alert, oriented, appears well hydrated and in no acute distress HEENT: atraumatic, conjunctiva  clear, no obvious abnormalities on inspection of external nose and ears  NECK: no obvious masses on inspection palpation  LUNGS: clear to auscultation bilaterally, no wheezes, rales or rhonchi, good air movement CV: HRRR, no clubbing cyanosis or  peripheral edema nl cap refill  MS: moves all extremities without noticeable focal  abnormality arthritis change no redness or warmth No bruising petechia PSYCH: pleasant and cooperative, no obvious depression or anxiety Lab Results  Component Value Date   WBC 5.1 05/08/2022   HGB 10.7 (L) 05/08/2022   HCT 31.0 (L) 05/08/2022   PLT 413 (H) 05/08/2022   GLUCOSE 118 (H) 05/08/2022   CHOL 179 12/17/2020   TRIG 156.0 (H) 12/17/2020   HDL 63.50 12/17/2020   LDLDIRECT 114.0 04/02/2010   LDLCALC 84 12/17/2020   ALT 12 05/08/2022   AST 15 05/08/2022   NA 139 05/08/2022   K 4.3 05/08/2022   CL 105 05/08/2022   CREATININE 0.78 05/08/2022   BUN 22 05/08/2022   CO2 27 05/08/2022   TSH 0.85 12/17/2020   INR 0.96 06/16/2017   HGBA1C 5.4 12/17/2020   BP Readings from Last 3 Encounters:  05/12/22 124/60  03/24/22 126/64  12/17/21 122/80    ASSESSMENT AND PLAN:  Discussed the following assessment and plan:  Essential hypertension  Medication management  Iron deficiency  Psoriatic  arthritis (HCC)  High risk medication use See below  try stopping amlodipine 2.5 Get with gi about the anemia returning    previous gi has reited  [name]   -Patient advised to return or notify health care team  if  new concerns arise.  Patient Instructions  Try decreasing the the amlodipine 2.5 mg per day and monitor BP  readings as you are doing. Plan fu  in 4-6 weeks to see how doing .  Uncertain whether  iron absorption vs  inflammation slow leak  is the cause of low iron levels.   So uncertain  about the prilosec.  I will do a Gi referral . In this regard. Standley Brooking. Latesia Norrington M.D.

## 2022-05-12 ENCOUNTER — Telehealth: Payer: Self-pay | Admitting: Hematology

## 2022-05-12 ENCOUNTER — Ambulatory Visit (INDEPENDENT_AMBULATORY_CARE_PROVIDER_SITE_OTHER): Payer: Medicare Other | Admitting: Internal Medicine

## 2022-05-12 ENCOUNTER — Encounter: Payer: Self-pay | Admitting: Internal Medicine

## 2022-05-12 VITALS — BP 124/60 | HR 65 | Temp 97.8°F | Ht 64.0 in | Wt 196.6 lb

## 2022-05-12 DIAGNOSIS — E611 Iron deficiency: Secondary | ICD-10-CM

## 2022-05-12 DIAGNOSIS — L4050 Arthropathic psoriasis, unspecified: Secondary | ICD-10-CM

## 2022-05-12 DIAGNOSIS — I10 Essential (primary) hypertension: Secondary | ICD-10-CM | POA: Diagnosis not present

## 2022-05-12 DIAGNOSIS — Z79899 Other long term (current) drug therapy: Secondary | ICD-10-CM

## 2022-05-12 NOTE — Telephone Encounter (Signed)
Called patient per 3/11 los notes to schedule f/u. Left voicemail with new appointment information and contact details if needing to reschedule. Sent scheduling message to [facility] location to set up IV iron for patient.

## 2022-05-12 NOTE — Patient Instructions (Addendum)
Try decreasing the the amlodipine 2.5 mg per day and monitor BP  readings as you are doing. Plan fu  in 4-6 weeks to see how doing .  Uncertain whether  iron absorption vs  inflammation slow leak  is the cause of low iron levels.   So uncertain  about the prilosec.  I will do a Gi referral . In this regard. Marland Kitchen

## 2022-05-17 ENCOUNTER — Other Ambulatory Visit: Payer: Self-pay | Admitting: Physician Assistant

## 2022-05-17 ENCOUNTER — Encounter: Payer: Self-pay | Admitting: Hematology

## 2022-05-18 ENCOUNTER — Inpatient Hospital Stay: Payer: Medicare Other

## 2022-05-18 VITALS — BP 121/55 | HR 79 | Temp 98.7°F | Resp 18 | Ht 64.0 in | Wt 200.1 lb

## 2022-05-18 DIAGNOSIS — D509 Iron deficiency anemia, unspecified: Secondary | ICD-10-CM

## 2022-05-18 DIAGNOSIS — G8929 Other chronic pain: Secondary | ICD-10-CM | POA: Diagnosis not present

## 2022-05-18 DIAGNOSIS — R5383 Other fatigue: Secondary | ICD-10-CM | POA: Diagnosis not present

## 2022-05-18 DIAGNOSIS — R61 Generalized hyperhidrosis: Secondary | ICD-10-CM | POA: Diagnosis not present

## 2022-05-18 DIAGNOSIS — R509 Fever, unspecified: Secondary | ICD-10-CM | POA: Diagnosis not present

## 2022-05-18 DIAGNOSIS — M1990 Unspecified osteoarthritis, unspecified site: Secondary | ICD-10-CM | POA: Diagnosis not present

## 2022-05-18 DIAGNOSIS — E611 Iron deficiency: Secondary | ICD-10-CM

## 2022-05-18 MED ORDER — SODIUM CHLORIDE 0.9 % IV SOLN: INTRAVENOUS | Status: DC

## 2022-05-18 MED ORDER — SODIUM CHLORIDE 0.9 % IV SOLN
510.0000 mg | Freq: Once | INTRAVENOUS | Status: AC
  Administered 2022-05-18: 510 mg via INTRAVENOUS
  Filled 2022-05-18: qty 510

## 2022-05-18 NOTE — Patient Instructions (Signed)

## 2022-05-18 NOTE — Telephone Encounter (Signed)
Next Visit: 08/26/2022  Last Visit: 03/24/2022  Last Fill: 02/19/2022  DX: Psoriatic arthritis   Current Dose per office note 03/24/2022: Simponi 50 mg every 28 days   Labs: 05/08/2022 RBC 3.35, Hgb 10.7, Hct 31.0, Platelet Count 413, Glucose 118  TB Gold: 10/01/2021 Neg    Okay to refill Simponi?

## 2022-05-18 NOTE — Progress Notes (Signed)
Patient refused Tylenol & Benadryl premedications during her Feraheme infusion visit today. She informed this Probation officer that she already took them at home. Pharmacy made aware of this.

## 2022-05-22 ENCOUNTER — Encounter: Payer: Self-pay | Admitting: Internal Medicine

## 2022-05-22 DIAGNOSIS — E611 Iron deficiency: Secondary | ICD-10-CM

## 2022-05-22 DIAGNOSIS — R739 Hyperglycemia, unspecified: Secondary | ICD-10-CM

## 2022-05-22 DIAGNOSIS — D649 Anemia, unspecified: Secondary | ICD-10-CM

## 2022-05-25 ENCOUNTER — Inpatient Hospital Stay: Payer: Medicare Other

## 2022-05-25 VITALS — BP 130/57 | HR 75 | Temp 98.4°F | Resp 18 | Ht 64.0 in | Wt 197.2 lb

## 2022-05-25 DIAGNOSIS — R61 Generalized hyperhidrosis: Secondary | ICD-10-CM | POA: Diagnosis not present

## 2022-05-25 DIAGNOSIS — E611 Iron deficiency: Secondary | ICD-10-CM

## 2022-05-25 DIAGNOSIS — D509 Iron deficiency anemia, unspecified: Secondary | ICD-10-CM

## 2022-05-25 DIAGNOSIS — G8929 Other chronic pain: Secondary | ICD-10-CM | POA: Diagnosis not present

## 2022-05-25 DIAGNOSIS — M1990 Unspecified osteoarthritis, unspecified site: Secondary | ICD-10-CM | POA: Diagnosis not present

## 2022-05-25 DIAGNOSIS — R5383 Other fatigue: Secondary | ICD-10-CM | POA: Diagnosis not present

## 2022-05-25 DIAGNOSIS — R509 Fever, unspecified: Secondary | ICD-10-CM | POA: Diagnosis not present

## 2022-05-25 MED ORDER — SODIUM CHLORIDE 0.9 % IV SOLN: INTRAVENOUS | Status: DC

## 2022-05-25 MED ORDER — SODIUM CHLORIDE 0.9 % IV SOLN
510.0000 mg | Freq: Once | INTRAVENOUS | Status: AC
  Administered 2022-05-25: 510 mg via INTRAVENOUS
  Filled 2022-05-25: qty 510

## 2022-05-25 NOTE — Patient Instructions (Signed)

## 2022-05-25 NOTE — Telephone Encounter (Signed)
Sorry such a  tedious task   Fine with me to see  other practices . Just have them send [place] information and results.  I will place future iron study orders and review  for any other labs .

## 2022-06-05 ENCOUNTER — Other Ambulatory Visit: Payer: Medicare Other

## 2022-06-08 ENCOUNTER — Other Ambulatory Visit: Payer: Medicare Other

## 2022-06-08 ENCOUNTER — Ambulatory Visit: Payer: Medicare Other | Admitting: Hematology

## 2022-06-17 ENCOUNTER — Ambulatory Visit: Payer: Medicare Other | Admitting: Neurology

## 2022-06-22 ENCOUNTER — Ambulatory Visit: Payer: Medicare Other | Admitting: Neurology

## 2022-06-29 ENCOUNTER — Encounter: Payer: Self-pay | Admitting: Internal Medicine

## 2022-07-01 MED ORDER — AMLODIPINE BESYLATE 5 MG PO TABS: 2.5000 mg | ORAL_TABLET | Freq: Every day | ORAL | 0 refills | Status: DC

## 2022-07-01 NOTE — Telephone Encounter (Signed)
Thanks for the update  Bp fine and no further changes needed  Please update med list to ? Taking only amlodipine 2.5 per day Dont have  opinion on multiple covid vaccine   (although you have  immunosuppressed  therapy )  May benefit  but not sure when best to take  ( before resp seasons seems the best )

## 2022-07-03 ENCOUNTER — Telehealth: Payer: Self-pay | Admitting: *Deleted

## 2022-07-03 NOTE — Telephone Encounter (Signed)
Per patient, patient is not taking folic acid.

## 2022-07-06 ENCOUNTER — Other Ambulatory Visit: Payer: Self-pay | Admitting: *Deleted

## 2022-07-06 ENCOUNTER — Telehealth: Payer: Self-pay | Admitting: *Deleted

## 2022-07-06 MED ORDER — FOLIC ACID 1 MG PO TABS
1.0000 mg | ORAL_TABLET | Freq: Every day | ORAL | 3 refills | Status: DC
Start: 2022-07-06 — End: 2023-05-13

## 2022-07-06 NOTE — Telephone Encounter (Signed)
Last Fill: 04/27/2017  Next Visit: 07/26/2022  Last Visit: 03/24/2022  Dx: Psoriatic arthritis   Current Dose per office note on 03/24/2022: folic acid 1 mg p.o. daily   Okay to refill folic acid?

## 2022-07-06 NOTE — Telephone Encounter (Signed)
LMOM for patient to call office to discuss message.   Pollyann Savoy, MD  Audrie Lia, RT Please advise patient that she should take folic acid 1 mg p.o. daily.  It is important to reduce the side effects of methotrexate.  Please send a prescription for folic acid 1 mg p.o. daily 16-XWR supply with 3 refills. Thank you, Pollyann Savoy, MD       Previous Messages    ----- Message ----- From: Audrie Lia, RT Sent: 07/03/2022  11:39 AM EDT To: Pollyann Savoy, MD Subject: NOT TAKING FOLIC ACID                          FYI - I called patient, patient is using MTX; however, she is not taking folic acid. It is documented in her last note that she is taking folic acid.

## 2022-07-06 NOTE — Telephone Encounter (Signed)
I called patient, patient verbalized understanding. 

## 2022-07-28 ENCOUNTER — Ambulatory Visit (INDEPENDENT_AMBULATORY_CARE_PROVIDER_SITE_OTHER): Payer: Medicare Other | Admitting: Neurology

## 2022-07-28 ENCOUNTER — Encounter: Payer: Self-pay | Admitting: Neurology

## 2022-07-28 VITALS — BP 135/68 | HR 89 | Ht 64.0 in | Wt 198.0 lb

## 2022-07-28 DIAGNOSIS — G629 Polyneuropathy, unspecified: Secondary | ICD-10-CM | POA: Diagnosis not present

## 2022-07-28 DIAGNOSIS — M4802 Spinal stenosis, cervical region: Secondary | ICD-10-CM

## 2022-07-28 MED ORDER — PREGABALIN 50 MG PO CAPS: ORAL_CAPSULE | ORAL | 5 refills | Status: DC

## 2022-07-28 NOTE — Patient Instructions (Addendum)
Start Lyrica 50mg  at bedtime x 2 weeks, then 2 tablet at bedtime  Refer to [facility] PT  Return to clinic in 4 months

## 2022-07-28 NOTE — Progress Notes (Signed)
Follow-up Visit   Date: 07/28/2022    Jade Cardenas MRN: [id number] DOB: 1950/03/05    Jade Cardenas is a 72 y.o. left-handed female with psoriatic arthritis, hypertension, hyperlipidemia, s/p lumbar surgery  x 2 and cervical surgery x 2 returning to the clinic for follow-up of small fiber neuropathy.  The patient was accompanied to the clinic by self.   IMPRESSION/PLAN:  Small fiber neuropathy confirmed by skin biopsy contributed by immune-mediated arthritis and age.   - Reduce cymbalta to 30mg  x 1 week, then stop  - Start Lyrica 50mg  at bedtime x 2 weeks, then 2 tablet at bedtime  Bilateral hand weakness>> paresthesias due to cervical canal stenosis and radiculopathy.  NCS/EMG did not show entrapment neuropathy.   - Start neck PT  Return to clinic in 4 months  --------------------------------------------- History of present illness: Starting in late 2021, she began having numbness/tingling involving the soles and the lateral side of her right foot.  She also has similar symptoms over the soles of the left foot.  Symptoms are constant. She has some weakness in the right foot, especially with her toes.  Occasionally, she has shooting pain down the back of her right leg.   She has history two cervical and two lumbar surgeries by Dr. Dutch Quint.  She has no history of diabetes, alcohol use, or chemotherapy.   UPDATE 07/28/2022:  Over the past several months, she has been having neck stiffness and pain radiating in to the left shoulder.  She continues to have burning pain in the feet and feels that Cymbalta does not help at all.  No interval falls or illnesses.   Medications:  Current Outpatient Medications on File Prior to Visit  Medication Sig Dispense Refill   acetaminophen (TYLENOL) 650 MG CR tablet Take 1,300 mg by mouth See admin instructions. Take 1300 mg in the morning and may take an additional 1300 mg dose as needed for pain     amLODipine (NORVASC) 5 MG tablet Take  0.5 tablets (2.5 mg total) by mouth daily. 90 tablet 0   B-D ALLERGY SYRINGE 1CC/28G 28G X 1/2" 1 ML MISC USE AS DIRECTED ONCE WEEKLY 12 each 3   Calcium Carb-Cholecalciferol (CALCIUM+D3 PO) Take 1 tablet by mouth daily.     diphenhydramine-acetaminophen (TYLENOL PM) 25-500 MG TABS tablet Take 1 tablet by mouth at bedtime.     DULoxetine (CYMBALTA) 60 MG capsule TAKE 1 CAPSULE BY MOUTH EVERY DAY 90 capsule 2   folic acid (FOLVITE) 1 MG tablet Take 1 tablet (1 mg total) by mouth daily. 90 tablet 3   losartan (COZAAR) 100 MG tablet TAKE 1 TABLET BY MOUTH EVERY DAY 90 tablet 0   lovastatin (MEVACOR) 20 MG tablet TAKE 1 TABLET BY MOUTH EVERY DAY IN THE EVENING 90 tablet 0   methotrexate 50 MG/2ML injection INJECT 0.3ML INTO THE SKIN ONCE WEEKLY 6 mL 0   Multiple Vitamins-Minerals (MULTIVITAMIN ADULTS PO) Take 1 tablet by mouth daily.      SIMPONI 50 MG/0.5ML SOAJ INJECT 50MG  UNDER THE SKIN ONCE MONTHLY 1.5 mL 0   Current Facility-Administered Medications on File Prior to Visit  Medication Dose Route Frequency Provider Last Rate Last Admin   dexamethasone (DECADRON) injection 4 mg  4 mg Intra-articular Once Vivi Barrack, DPM        Allergies:  Allergies  Allergen Reactions   Erythromycin Hives, Swelling and Other (See Comments)    All Mycins; throat swells shut and she gets "really  hot"   Iodinated Contrast Media Shortness Of Breath and Swelling    CT contrast; throat swelling, difficulty breathing.  In [place] "many years ago."   Ace Inhibitors Cough   Indomethacin Other (See Comments)   Keflex [Cephalexin] Hives   Macrobid [Nitrofurantoin] Other (See Comments)    dizziness    Vital Signs:  BP 135/68   Pulse 89   Ht 5\' 4"  (1.626 m)   Wt 198 lb (89.8 kg)   LMP 03/02/1984   SpO2 94%   BMI 33.99 kg/m   Neurological Exam: MENTAL STATUS including orientation to time, place, person, recent and remote memory, attention span and concentration, language, and fund of knowledge is  normal.  Speech is not dysarthric.  CRANIAL NERVES:   Pupils equal round and reactive to light.  Normal conjugate, extra-ocular eye movements in all directions of gaze.  No ptosis.  Face is symmetric.   MOTOR:  Motor strength is 5/5 in all extremities.  No atrophy, fasciculations or abnormal movements.  No pronator drift.  Tone is normal.    MSRs:  Reflexes are 3+/4 throughout.  SENSORY:  Intact to vibration throughout.  COORDINATION/GAIT:  Gait narrow based and stable.   Data: MRI lumbar spine 02/26/2021: 1. Prior L4-L5 PLIF with new mild adjacent segment disease at L3-L4. No high-grade spinal canal or neuroforaminal stenosis at any level 2. Progressive severe bilateral facet arthropathy at L5-S1.   NCS/EMG of bilateral legs 04/03/2021:  Normal  NCS/EMG of bilateral arms 12/25/2021:   Chronic C5-C6 radiculopathy affecting bilateral upper extremities, mild. There is no evidence of carpal tunnel syndrome or ulnar neuropathy affecting either upper extremity.   Thank you for allowing me to participate in patient's care.  If I can answer any additional questions, I would be pleased to do so.    Sincerely,    Tyus Kallam K. Allena Katz, DO

## 2022-07-30 ENCOUNTER — Telehealth: Payer: Self-pay | Admitting: Neurology

## 2022-07-30 DIAGNOSIS — R202 Paresthesia of skin: Secondary | ICD-10-CM

## 2022-07-30 DIAGNOSIS — M4802 Spinal stenosis, cervical region: Secondary | ICD-10-CM

## 2022-07-30 DIAGNOSIS — R29898 Other symptoms and signs involving the musculoskeletal system: Secondary | ICD-10-CM

## 2022-07-30 NOTE — Telephone Encounter (Signed)
Patient wants a referral to [facility] PT she said that we sent it to the wrong place

## 2022-07-31 NOTE — Telephone Encounter (Signed)
Referral created and will be sent once [facility] opens on Monday. They close at 12 on Fridays.  Called patient and left a detailed message that her requested referral will be sent.

## 2022-08-06 ENCOUNTER — Other Ambulatory Visit: Payer: Self-pay | Admitting: Physician Assistant

## 2022-08-06 ENCOUNTER — Encounter (HOSPITAL_BASED_OUTPATIENT_CLINIC_OR_DEPARTMENT_OTHER): Payer: Self-pay | Admitting: Obstetrics & Gynecology

## 2022-08-06 ENCOUNTER — Other Ambulatory Visit (HOSPITAL_COMMUNITY)
Admission: RE | Admit: 2022-08-06 | Discharge: 2022-08-06 | Disposition: A | Discharge status: Home / Self Care | Payer: Medicare Other | Source: Ambulatory Visit | Attending: Obstetrics & Gynecology | Admitting: Obstetrics & Gynecology

## 2022-08-06 ENCOUNTER — Ambulatory Visit (INDEPENDENT_AMBULATORY_CARE_PROVIDER_SITE_OTHER): Payer: Medicare Other | Admitting: Obstetrics & Gynecology

## 2022-08-06 VITALS — BP 123/80 | HR 86 | Ht 65.0 in

## 2022-08-06 DIAGNOSIS — R1903 Right lower quadrant abdominal swelling, mass and lump: Secondary | ICD-10-CM

## 2022-08-06 DIAGNOSIS — Z780 Asymptomatic menopausal state: Secondary | ICD-10-CM

## 2022-08-06 DIAGNOSIS — Z9189 Other specified personal risk factors, not elsewhere classified: Secondary | ICD-10-CM | POA: Diagnosis not present

## 2022-08-06 DIAGNOSIS — M85852 Other specified disorders of bone density and structure, left thigh: Secondary | ICD-10-CM

## 2022-08-06 DIAGNOSIS — R10823 Right lower quadrant rebound abdominal tenderness: Secondary | ICD-10-CM | POA: Diagnosis not present

## 2022-08-06 DIAGNOSIS — M85851 Other specified disorders of bone density and structure, right thigh: Secondary | ICD-10-CM | POA: Diagnosis not present

## 2022-08-06 DIAGNOSIS — Z01419 Encounter for gynecological examination (general) (routine) without abnormal findings: Secondary | ICD-10-CM

## 2022-08-06 NOTE — Telephone Encounter (Signed)
Pt will be due to update lab work at upcoming visit. Ok to send in 3 month supply

## 2022-08-06 NOTE — Progress Notes (Signed)
72 y.o. G0P0000 Widowed White or Caucasian female here for breast and pelvic exam.  I am also following her for h/o DES exposure in utero.  Denies vaginal bleeding.    Seeing a lot of musicals right now.  Loves them.  Seeing the [facility] on Sunday and a group of women she knows who call themselves the "[employer]".    Concerned about RLQ pain that is at edge of her prior hysterectomy incision.  Feels discomfort almost all the time and pain if presses into the area.  No changes in bowel habits.  Patient's last menstrual period was 03/02/1984.          Sexually active: No.  H/O STD:  no  Health Maintenance: PCP:  Dr. Fabian Sharp.  Last wellness appt was 3/202.  Did blood work at that appt:   Vaccines are up to date:  yes Colonoscopy:  11/12/2020, rechekc 5 years MMG:  11/21/2021 Negative BMD:  2022 osteopenia with worse T score in right femoral neck Last pap smear:  07/25/2021 negative.   H/o abnormal pap smear:  no    reports that she has never smoked. She has never used smokeless tobacco. She reports that she does not drink alcohol and does not use drugs.  Past Medical History:  Diagnosis Date   Anemia    iron def   Arthritis    rheumatoid, psoriatic, osteoarthritis    Bone spur    RIGHT FOOT    Broken toe    Bruises easily    Cellulitis    Chronic back pain    COLONIC POLYPS, HX OF 02/09/2007   Cough, persistent 01/21/2012   poss from acei    DES exposure in utero, unknown    Femur fracture, left (HCC)    Fibromyalgia    doesn't require meds   GASTRIC ULCER, ACUTE, HEMORRHAGE, HX OF 02/09/2007   GERD (gastroesophageal reflux disease)    takes Nexium daily   Goiter    H/O hiatal hernia    H/O: hysterectomy    when 73 yrs old   Hx of seasonal allergies    takes Claritin daily   HYPERLIPIDEMIA 12/29/2006   takes Lovastatin nightly   HYPERTENSION 12/29/2006   takes Enalapril daily   IBS 04/28/2007   Insomnia    related to pain;takes Flexeril and Tylenol PM nightly    Joint pain    Joint swelling    Neuropathy    small cell   Osteopenia    Palpitations 02/09/2007   PONV (postoperative nausea and vomiting)    NAUSEA   REDUCTION MAMMOPLASTY, HX OF 02/09/2007   Right bundle branch block 02/09/2007   S/P lumbar fusion 6 13 09/03/2011   L4 L5  posterior    SYNCOPE 12/07/2007   Tendonitis    RLE    UNS ADVRS EFF OTH RX MEDICINAL\T\BIOLOGICAL SBSTNC 02/09/2007    Past Surgical History:  Procedure Laterality Date   BACK SURGERY  12+yrs ago   Synovial Cyst removal   BACK SURGERY  6/13   lumbar L4-L5 fusion, lamenectomy   bone spur  6-50yrs ago   right ankle   BUNIONECTOMY  10/03/14   right foot   CERVICAL FUSION     2 12    CHOLECYSTECTOMY  1996   HARDWARE REMOVAL Left 08/01/2018   Procedure: Left knee hardware removal;  Surgeon: Ollen Gross, MD;  Location: [facility];  Service: Orthopedics;  Laterality: Left;    JOINT REPLACEMENT  KNEE ARTHROPLASTY     POSTERIOR CERVICAL LAMINECTOMY Left 07/30/2015   Procedure: Laminectomy and Foraminotomy - left - Cervical two -Cervical three;  Surgeon: Julio Sicks, MD;  Location: [facility];  Service: Neurosurgery;  Laterality: Left;   REDUCTION MAMMAPLASTY Bilateral 1989   TONSILLECTOMY     as a child   TOTAL ABDOMINAL HYSTERECTOMY W/ BILATERAL SALPINGOOPHORECTOMY  1986   TOTAL KNEE ARTHROPLASTY N/A 06/21/2017   Procedure: LEFT TOTAL KNEE ARTHROPLASTY AND RIGHT KNEE CORTISONE INJECTION;  Surgeon: Ollen Gross, MD;  Location: [facility];  Service: Orthopedics;  Laterality: N/A;    Current Outpatient Medications  Medication Sig Dispense Refill   acetaminophen (TYLENOL) 650 MG CR tablet Take 1,300 mg by mouth See admin instructions. Take 1300 mg in the morning and may take an additional 1300 mg dose as needed for pain     amLODipine (NORVASC) 5 MG tablet Take 0.5 tablets (2.5 mg total) by mouth daily. 90 tablet 0   B-D ALLERGY SYRINGE 1CC/28G 28G X 1/2" 1 ML MISC USE AS DIRECTED ONCE WEEKLY 12 each 3    Calcium Carb-Cholecalciferol (CALCIUM+D3 PO) Take 1 tablet by mouth daily.     diphenhydramine-acetaminophen (TYLENOL PM) 25-500 MG TABS tablet Take 1 tablet by mouth at bedtime.     folic acid (FOLVITE) 1 MG tablet Take 1 tablet (1 mg total) by mouth daily. 90 tablet 3   Golimumab (SIMPONI) 50 MG/0.5ML SOAJ INJECT 50MG  UNDER THE SKIN ONCE MONTHLY 1.5 mL 0   losartan (COZAAR) 100 MG tablet TAKE 1 TABLET BY MOUTH EVERY DAY 90 tablet 0   lovastatin (MEVACOR) 20 MG tablet TAKE 1 TABLET BY MOUTH EVERY DAY IN THE EVENING 90 tablet 0   methotrexate 50 MG/2ML injection INJECT 0.3ML INTO THE SKIN ONCE WEEKLY 6 mL 0   Multiple Vitamins-Minerals (MULTIVITAMIN ADULTS PO) Take 1 tablet by mouth daily.      pregabalin (LYRICA) 50 MG capsule Take 1 tablet at bedtime x 2 weeks, then increase to 2 tablet at bedtime. 60 capsule 5   Current Facility-Administered Medications  Medication Dose Route Frequency Provider Last Rate Last Admin   dexamethasone (DECADRON) injection 4 mg  4 mg Intra-articular Once Vivi Barrack, DPM        Family History  Problem Relation Age of Onset   Diabetes Mother    Colon cancer Mother        x2   Arthritis Mother    Hypertension Mother    Endometrial cancer Mother    Stroke Father    Heart disease Father    Colon cancer Father    Arthritis Brother    Hypertension Brother    Hyperlipidemia Brother    Infertility Brother    Diabetes Brother    Other Other        DISH brother   Hypertension Maternal Grandmother    Stroke Maternal Grandmother    Anesthesia problems Neg Hx    Hypotension Neg Hx    Malignant hyperthermia Neg Hx    Pseudochol deficiency Neg Hx     Review of Systems  Constitutional: Negative.   Genitourinary: Negative.     Exam:   BP 123/80 (BP Location: Left Arm, Patient Position: Sitting, Cuff Size: Large)   Pulse 86   Ht 5\' 5"  (1.651 m) Comment: Reported  LMP 03/02/1984   BMI 32.95 kg/m   Height: 5\' 5"  (165.1 cm) (Reported)  General  appearance: alert, cooperative and appears stated age Breasts: normal appearance, no masses  or tenderness Abdomen: soft, tenderness to palpation in RLQ and LLQ; bowel sounds normal; no masses,  no organomegaly (in supine position), with standing, possible mass/bulge noted RLQ Lymph nodes: Cervical, supraclavicular, and axillary nodes normal.  No abnormal inguinal nodes palpated Neurologic: Grossly normal  Pelvic: External genitalia:  no lesions              Urethra:  normal appearing urethra with no masses, tenderness or lesions              Bartholins and Skenes: normal                 Vagina: normal appearing vagina with atrophic changes and no discharge, no lesions              Cervix: absent              Pap taken: Yes.   Bimanual Exam:  Uterus:  uterus absent              Adnexa: no mass, fullness, tenderness               Rectovaginal: Confirms               Anus:  normal sphincter tone, no lesions  Chaperone, Ina Homes, CMA, was present for exam.  Assessment/Plan: 1. Encntr for gyn exam (general) (routine) w/o abn findings - Pap smear obtained today - Mammogram 10/2021 - Colonoscopy 11/12/2020, follow up 5 years - Bone mineral density 08/2020 - lab work done with PCP, Dr. Fabian Sharp - vaccines reviewed/updated  2. DES exposure in utero - Cytology - PAP( [facility])  3. Osteopenia of necks of both femurs - discussed BMD.  Will repeat another 1-2 years.  4. Postmenopausal - not on HRT  5. RLQ abdominal mass - will proceed with scheduling CT abd/pelvis due to concerns for hernia  6. Right lower quadrant abdominal tenderness with rebound tenderness

## 2022-08-06 NOTE — Telephone Encounter (Signed)
Last Fill: 05/18/2022  Labs: 05/08/2022  RBC 3.35 Hemoglobin 10.7 HCT 31.0 Platelet count 413 Glucose 118  TB Gold: 10/01/2021  TB Gold is negative.   Next Visit: 08/26/2022  Last Visit: 03/24/2022  [id number] arthritis   Current Dose per office note 03/24/2022: Simponi 50 mg every 28 days   Okay to refill Simponi?

## 2022-08-09 ENCOUNTER — Other Ambulatory Visit: Payer: Self-pay | Admitting: Family

## 2022-08-10 LAB — CYTOLOGY - PAP
Adequacy: ABSENT
Diagnosis: NEGATIVE

## 2022-08-12 ENCOUNTER — Telehealth: Payer: Self-pay | Admitting: Neurology

## 2022-08-12 NOTE — Telephone Encounter (Signed)
Pt left message that the lyrica is bothering her stomach and not agree with her  please call

## 2022-08-12 NOTE — Progress Notes (Signed)
Office Visit Note  Patient: Jade Cardenas             Date of Birth: Jan 26, 1951           MRN: [id number]             PCP: Madelin Headings, MD Referring: Madelin Headings, MD Visit Date: 08/26/2022 Occupation: @GUAROCC @  Subjective:  Medication management and psoriasis  History of Present Illness: Jade Cardenas is a 72 y.o. female with history of psoriatic arthritis, psoriasis, osteoarthritis and degenerative disc disease.  She states she has been having a flare of psoriasis since February 2024.  She has not noticed any increased joint pain or joint swelling.  She has been taking Simponi subcu injections 50 mg every 28 days and methotrexate 0.3 mL subcu weekly.  She states she stopped folic acid for a while and then restarted.  She has been experiencing some abdominal discomfort.  She had a CT scan of her abdomen and pelvis recently which showed large hiatal hernia.  She was referred to the surgeon and the appointment is pending.  She is trying to establish with a new gastroenterologist as Dr. Kinnie Scales retired.  She denies any increased discomfort in her hands and her knee joints.  She has off-and-on discomfort in her neck.  She denies any discomfort in her lower back.  She continues to have some fatigue and generalized achiness from fibromyalgia.  She came off Cymbalta as she did not like the side effects.  She also tried Lyrica but did not like side effects of Lyrica.  She states her neuropathy is worse being off medications.    Activities of Daily Living:  Patient reports morning stiffness for a few minutes.  Patient Reports nocturnal pain.  Difficulty dressing/grooming: Denies Difficulty climbing stairs: Reports Difficulty getting out of chair: Reports Difficulty using hands for taps, buttons, cutlery, and/or writing: Reports  Review of Systems  Constitutional:  Positive for fatigue.  HENT:  Positive for mouth dryness. Negative for mouth sores.   Eyes:  Positive for dryness.   Respiratory:  Negative for shortness of breath.   Cardiovascular:  Negative for chest pain and palpitations.  Gastrointestinal:  Negative for blood in stool, constipation and diarrhea.  Endocrine: Negative for increased urination.  Genitourinary:  Negative for involuntary urination.  Musculoskeletal:  Positive for joint pain, gait problem, joint pain, myalgias, morning stiffness, muscle tenderness and myalgias. Negative for joint swelling and muscle weakness.  Skin:  Positive for rash and sensitivity to sunlight. Negative for color change and hair loss.  Allergic/Immunologic: Negative for susceptible to infections.  Neurological:  Positive for headaches. Negative for dizziness.  Hematological:  Negative for swollen glands.  Psychiatric/Behavioral:  Positive for sleep disturbance. Negative for depressed mood. The patient is not nervous/anxious.     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   Fibromyalgia 05/17/2016   DJD (degenerative joint disease), cervical 02/18/2016   Heel spur 09/29/2015   Ingrown toenail 09/29/2015   Foraminal stenosis of cervical region 07/30/2015   Microcytic hypochromic anemia 05/22/2015   Tailor's bunion, left 01/18/2015  H/O hiatal hernia    Bilateral primary osteoarthritis of knee 12/15/2013   Iron deficiency 12/15/2013   Psoriatic arthritis (HCC) 12/15/2013   History of foot fracture 12/15/2013   Dermatitis 10/17/2013   Hx of diethylstilbestrol (DES) exposure in utero unknown 12/09/2012   Tinnitus 06/23/2012   Scalp lesion 01/21/2012    Headache(784.0) 01/21/2012   Hx of psoriasis 01/21/2012   Weight gain 01/21/2012   Ankylosing spondylitis  possible 09/03/2011   Hx of transfusion 09/03/2011   S/P lumbar fusion 6 13 09/03/2011   Lumbar stenosis with neurogenic claudication 08/05/2011   ALLERGIC RHINITIS 02/19/2010   GERD 02/19/2010   SPINAL STENOSIS, CERVICAL 02/19/2010   OSTEOPENIA 02/19/2010   IBS 04/28/2007   History of gastric ulcer 02/09/2007   COLONIC POLYPS, HX OF 02/09/2007   CHOLECYSTECTOMY, HX OF 02/09/2007   Hyperlipidemia 12/29/2006   Essential hypertension 12/29/2006    Past Medical History:  Diagnosis Date   Anemia    iron def   Arthritis    rheumatoid, psoriatic, osteoarthritis    Bone spur    RIGHT FOOT    Broken toe    Bruises easily    Cellulitis    Chronic back pain    COLONIC POLYPS, HX OF 02/09/2007   Cough, persistent 01/21/2012   poss from acei    DES exposure in utero, unknown    Femur fracture, left (HCC)    Fibromyalgia    doesn't require meds   GASTRIC ULCER, ACUTE, HEMORRHAGE, HX OF 02/09/2007   GERD (gastroesophageal reflux disease)    takes Nexium daily   Goiter    H/O hiatal hernia    H/O: hysterectomy    when 72 yrs old   Hx of seasonal allergies    takes Claritin daily   HYPERLIPIDEMIA 12/29/2006   takes Lovastatin nightly   HYPERTENSION 12/29/2006   takes Enalapril daily   IBS 04/28/2007   Insomnia    related to pain;takes Flexeril and Tylenol PM nightly   Joint pain    Joint swelling    Neuropathy    small cell   Osteopenia    Palpitations 02/09/2007   PONV (postoperative nausea and vomiting)    NAUSEA   REDUCTION MAMMOPLASTY, HX OF 02/09/2007   Right bundle branch block 02/09/2007   S/P lumbar fusion 6 13 09/03/2011   L4 L5  posterior    SYNCOPE 12/07/2007   Tendonitis    RLE    UNS ADVRS EFF OTH RX MEDICINAL\T\BIOLOGICAL SBSTNC 02/09/2007    Family History  Problem Relation Age of Onset   Diabetes Mother    Colon cancer Mother        x2    Arthritis Mother    Hypertension Mother    Endometrial cancer Mother    Stroke Father    Heart disease Father    Colon cancer Father    Arthritis Brother    Hypertension Brother    Hyperlipidemia Brother    Infertility Brother    Diabetes Brother    Other Other        DISH brother   Hypertension Maternal Grandmother    Stroke Maternal Grandmother    Anesthesia problems Neg Hx    Hypotension Neg Hx    Malignant hyperthermia Neg Hx    Pseudochol deficiency Neg Hx    Past Surgical History:  Procedure Laterality Date   BACK SURGERY  12+yrs ago   Synovial Cyst removal   BACK SURGERY  6/13   lumbar L4-L5 fusion,  lamenectomy   bone spur  6-70yrs ago   right ankle   BUNIONECTOMY  10/03/14   right foot   CERVICAL FUSION     2 12    CHOLECYSTECTOMY  1996   HARDWARE REMOVAL Left 08/01/2018   Procedure: Left knee hardware removal;  Surgeon: Ollen Gross, MD;  Location: [facility];  Service: Orthopedics;  Laterality: Left;    JOINT REPLACEMENT     KNEE ARTHROPLASTY     POSTERIOR CERVICAL LAMINECTOMY Left 07/30/2015   Procedure: Laminectomy and Foraminotomy - left - Cervical two -Cervical three;  Surgeon: Julio Sicks, MD;  Location: [facility];  Service: Neurosurgery;  Laterality: Left;   REDUCTION MAMMAPLASTY Bilateral 1989   TONSILLECTOMY     as a child   TOTAL ABDOMINAL HYSTERECTOMY W/ BILATERAL SALPINGOOPHORECTOMY  1986   TOTAL KNEE ARTHROPLASTY N/A 06/21/2017   Procedure: LEFT TOTAL KNEE ARTHROPLASTY AND RIGHT KNEE CORTISONE INJECTION;  Surgeon: Ollen Gross, MD;  Location: [facility];  Service: Orthopedics;  Laterality: N/A;   Social History   Social History Narrative   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  Husband died suddenly 2010Non smoker         Left handed    Lives in a one story home          Immunization History  Administered Date(s) Administered   Fluad Quad(high Dose 65+) 11/04/2018, 11/07/2019,  11/21/2020, 11/11/2021   Influenza Split 11/29/2012, 11/27/2013   Influenza Whole 11/09/2008   Influenza, High Dose Seasonal PF 11/26/2015, 11/30/2016, 11/03/2017, 12/05/2020   Influenza,inj,Quad PF,6+ Mos 11/27/2014   Influenza-Unspecified 12/22/2016   PFIZER Comirnaty(Gray Top)Covid-19 Tri-Sucrose Vaccine 03/28/2020, 11/24/2021   PFIZER(Purple Top)SARS-COV-2 Vaccination 04/08/2019, 05/03/2019, 10/19/2019, 03/28/2020   PPD Test 12/18/2013, 11/27/2014, 11/26/2015, 11/30/2016   Pfizer Covid-19 Vaccine Bivalent Booster 66yrs & up 11/21/2020   Pneumococcal Conjugate-13 02/28/2013   Pneumococcal Polysaccharide-23 03/03/2003, 02/28/2014, 11/07/2019   Respiratory Syncytial Virus Vaccine,Recomb Aduvanted(Arexvy) 12/12/2021   Td 05/09/2008   Tdap 07/19/2018   Zoster Recombinat (Shingrix) 12/03/2016, 02/04/2017   Zoster, Live 01/20/2011     Objective: Vital Signs: BP 134/75 (BP Location: Left Arm, Patient Position: Sitting, Cuff Size: Normal)   Pulse (!) 102   Resp 16   Ht 5\' 5"  (1.651 m)   Wt 195 lb (88.5 kg)   LMP 03/02/1984   BMI 32.45 kg/m    Physical Exam Vitals and nursing note reviewed.  Constitutional:      Appearance: She is well-developed.  HENT:     Head: Normocephalic and atraumatic.  Eyes:     Conjunctiva/sclera: Conjunctivae normal.  Cardiovascular:     Rate and Rhythm: Normal rate and regular rhythm.     Heart sounds: Normal heart sounds.  Pulmonary:     Effort: Pulmonary effort is normal.     Breath sounds: Normal breath sounds.  Abdominal:     General: Bowel sounds are normal.     Palpations: Abdomen is soft.  Musculoskeletal:     Cervical back: Normal range of motion.  Lymphadenopathy:     Cervical: No cervical adenopathy.  Skin:    General: Skin is warm and dry.     Capillary Refill: Capillary refill takes less than 2 seconds.     Comments: Few dry patches were noted on the back and forearms.  Neurological:     Mental Status: She is alert and oriented  to person, place, and time.  Psychiatric:  Behavior: Behavior normal.      Musculoskeletal Exam: Cervical spine was in good range of motion.  Had thoracic kyphosis noted.  She had no tenderness over thoracic or lumbar spine.  Shoulder joints, elbow joints, wrist joints, MCPs PIPs and DIPs been good range of motion with bilateral PIP and DIP thickening.  No synovitis was noted.  Hip joints and knee joints were in good range of motion.  Left knee joint was replaced.  There was no tenderness over ankles or MTPs.  She had bilateral hammertoes.  CDAI Exam: CDAI Score: -- Patient Global: --; Provider Global: -- Swollen: --; Tender: -- Joint Exam 08/26/2022   No joint exam has been documented for this visit   There is currently no information documented on the homunculus. Go to the Rheumatology activity and complete the homunculus joint exam.  Investigation: No additional findings.  Imaging: CT ABDOMEN PELVIS WO CONTRAST  Result Date: 08/19/2022 CLINICAL DATA:  Hernia suspected, abdominal wall EXAM: CT ABDOMEN AND PELVIS WITHOUT CONTRAST TECHNIQUE: Multidetector CT imaging of the abdomen and pelvis was performed following the standard protocol without IV contrast. RADIATION DOSE REDUCTION: This exam was performed according to the departmental dose-optimization program which includes automated exposure control, adjustment of the mA and/or kV according to patient size and/or use of iterative reconstruction technique. COMPARISON:  None Available. FINDINGS: Lower chest: There is a tubular soft tissue attenuation structure in the left lateral costophrenic angle region, incompletely characterized but favored to represent a bronchocele. There is associated adjacent minimal atelectasis. No overt consolidation or suspicious mass. No pleural effusion. The heart is normal in size. No pericardial effusion. Hepatobiliary: The liver is normal in size. Non-cirrhotic configuration. No suspicious mass. No  intrahepatic or extrahepatic bile duct dilation. Gallbladder is surgically absent. Pancreas: Unremarkable. No pancreatic ductal dilatation or surrounding inflammatory changes. Spleen: Within normal limits. No focal lesion. Adrenals/Urinary Tract: Adrenal glands are unremarkable. No suspicious renal mass. No hydronephrosis. no renal or ureteric calculi. Note is made of a 4.5 x 5.1 cm partially exophytic simple cyst arising from the right kidney upper pole. Urinary bladder is under distended, precluding optimal assessment. However, no large mass or stones identified. No perivesical fat stranding. Stomach/Bowel: No disproportionate dilation of the small or large bowel loops. No evidence of abnormal bowel wall thickening or inflammatory changes. The appendix is unremarkable. There is moderate-to-large hiatal hernia containing more than half of the proximal stomach. There are multiple diverticula mainly in the sigmoid colon, without imaging signs of diverticulitis. Vascular/Lymphatic: No ascites or pneumoperitoneum. No abdominal or pelvic lymphadenopathy, by size criteria. No aneurysmal dilation of the major abdominal arteries. There are mild peripheral atherosclerotic vascular calcifications of the aorta and its major branches. Reproductive: Patient is status post hysterectomy and bilateral salpingo-oophorectomy. Other: There is a tiny fat containing umbilical hernia. No other anterior abdominal wall or inguinal hernia seen. Musculoskeletal: No suspicious osseous lesions. There are mild - moderate multilevel degenerative changes in the visualized spine. Posterior spinal fusion of L4-5 noted with intervening disc spacer. IMPRESSION: 1. There is a tiny fat containing umbilical hernia. No other anterior abdominal wall or inguinal hernia seen. There is moderate-to-large hiatal hernia. 2. Multiple other nonacute observations, as described above. Electronically Signed   By: Jules Schick M.D.   On: 08/19/2022 08:30     Recent Labs: Lab Results  Component Value Date   WBC 5.1 05/08/2022   HGB 10.7 (L) 05/08/2022   PLT 413 (H) 05/08/2022   NA 139 05/08/2022  K 4.3 05/08/2022   CL 105 05/08/2022   CO2 27 05/08/2022   GLUCOSE 118 (H) 05/08/2022   BUN 22 05/08/2022   CREATININE 0.78 05/08/2022   BILITOT 0.6 05/08/2022   ALKPHOS 82 05/08/2022   AST 15 05/08/2022   ALT 12 05/08/2022   PROT 7.4 05/08/2022   ALBUMIN 4.3 05/08/2022   CALCIUM 9.5 05/08/2022   GFRAA 87 02/02/2020   QFTBGOLDPLUS NEGATIVE 10/01/2021      Speciality Comments: Failed therapy: Arava and Otezla due to diarrhea Enbrel, Humira-neuropathy  Procedures:  No procedures performed Allergies: Erythromycin, Iodinated contrast media, Ace inhibitors, Indomethacin, Keflex [cephalexin], and Macrobid [nitrofurantoin]   Assessment / Plan:     Visit Diagnoses: Psoriatic arthritis (HCC)-patient had no synovitis on the examination.  She continues to have some joint discomfort due to underlying osteoarthritis.  Hx of psoriasis-no psoriasis lesions were noted.  She has few patches of dry skin on her back and forearm.  The patient is requesting prescription for clobetasol cream was sent.  High risk medication use - Simponi 50 mg every 28 days and methotrexate 0.3 ml every 7 days folic acid 1 mg p.o. daily..([place] caused diarrhea). -May 08, 2022 CBC showed hemoglobin 10.7.  CMP was normal.  Plan: CBC with Differential/Platelet, COMPLETE METABOLIC PANEL WITH GFR today and then every 3 months.  TB Gold was negative on October 01, 2021.  Annual TB Gold was advised.  Information on immunization was placed on the AVS.  She was advised to hold Simponi and methotrexate if she develops an infection then resume after the infection resolves.  She may require surgery for hiatal hernia.  I advised her to stop Simponi 1 month prior to the surgery and methotrexate 1 week prior to the surgery and may resume both medications 2 weeks after the surgery  if she had there is no an infection and she has clearance by the surgeon.  Annual skin examination to screen for skin cancer was discussed.  Use of sunscreen and sun protection was discussed.  Primary osteoarthritis of both hands-she has osteoarthritis in her hands which causes chronic discomfort.  No synovitis was noted.  Primary osteoarthritis of right knee-she gives history of intermittent discomfort.  Total knee replacement status, left - by Dr. Lequita Halt.  Doing well.  Primary osteoarthritis of both feet-she has bilateral hammertoes.  Proper fitting shoes were advised.  DDD (degenerative disc disease), cervical - S/P fusion x2: She gives history of intermittent discomfort.  DDD (degenerative disc disease), lumbar -she has intermittent pain.  S/P fusion.  She had a recent MRI of the lumbar spine on 02/26/2021.  Small fiber neuropathy-she discontinued Cymbalta and tried Lyrica but could not tolerate the side effects.  She has been having increased symptoms of neuropathy.  Fibromyalgia -she continues to have myalgia and fatigue.  Need for regular exercise and stretching was discussed.  Chronic fatigue-secondary to Ro myalgia.  Osteopenia of multiple sites - Followed by her GYN.  Updated DXA on 11/14/20: The BMD measured at DualFemur Neck Right is 0.829 g/cm2 with a T-score of -1.5.  Other iron deficiency anemia -patient had iron infusion by hematology.  Patient requested a ferritin level to be drawn today.  Plan: Ferritin  Orders: Orders Placed This Encounter  Procedures   CBC with Differential/Platelet   COMPLETE METABOLIC PANEL WITH GFR   Ferritin   Meds ordered this encounter  Medications   clobetasol cream (TEMOVATE) 0.05 %    Sig: Apply 1 Application topically 2 (two)  times daily.    Dispense:  30 g    Refill:  0    Follow-Up Instructions: Return in about 5 months (around 01/26/2023) for Psoriatic arthritis.   Pollyann Savoy, MD  Note - This record has been created  using Animal nutritionist.  Chart creation errors have been sought, but may not always  have been located. Such creation errors do not reflect on  the standard of medical care.

## 2022-08-13 NOTE — Telephone Encounter (Signed)
OK to stop Lyrica.  If she would like to try a different medication, we can start nortriptyline 10mg  at bedtime.  Side effects include sleepiness and lightheadedness, but usually at higher doses.

## 2022-08-13 NOTE — Telephone Encounter (Signed)
Called patient and informed her of Dr. Eliane Decree recommendations. Patient stated that she would like to hold off on trying any medication. She states she goes to PT next week and will just see how that helps. If the pain gets too bad she will call us to start the reccommended medication. Patient had no further questions or concerns.

## 2022-08-14 ENCOUNTER — Encounter (HOSPITAL_BASED_OUTPATIENT_CLINIC_OR_DEPARTMENT_OTHER): Payer: Self-pay | Admitting: Obstetrics & Gynecology

## 2022-08-16 ENCOUNTER — Ambulatory Visit (HOSPITAL_BASED_OUTPATIENT_CLINIC_OR_DEPARTMENT_OTHER)
Admission: RE | Admit: 2022-08-16 | Discharge: 2022-08-16 | Disposition: A | Discharge status: Home / Self Care | Payer: Medicare Other | Source: Ambulatory Visit | Attending: Obstetrics & Gynecology | Admitting: Obstetrics & Gynecology

## 2022-08-16 DIAGNOSIS — R10823 Right lower quadrant rebound abdominal tenderness: Secondary | ICD-10-CM | POA: Diagnosis not present

## 2022-08-16 DIAGNOSIS — K5730 Diverticulosis of large intestine without perforation or abscess without bleeding: Secondary | ICD-10-CM | POA: Diagnosis not present

## 2022-08-16 DIAGNOSIS — N3289 Other specified disorders of bladder: Secondary | ICD-10-CM | POA: Diagnosis not present

## 2022-08-16 DIAGNOSIS — N281 Cyst of kidney, acquired: Secondary | ICD-10-CM | POA: Diagnosis not present

## 2022-08-16 DIAGNOSIS — R1903 Right lower quadrant abdominal swelling, mass and lump: Secondary | ICD-10-CM | POA: Diagnosis not present

## 2022-08-19 ENCOUNTER — Encounter: Payer: Self-pay | Admitting: Internal Medicine

## 2022-08-21 ENCOUNTER — Other Ambulatory Visit (HOSPITAL_BASED_OUTPATIENT_CLINIC_OR_DEPARTMENT_OTHER): Payer: Self-pay | Admitting: *Deleted

## 2022-08-21 DIAGNOSIS — K449 Diaphragmatic hernia without obstruction or gangrene: Secondary | ICD-10-CM

## 2022-08-23 ENCOUNTER — Other Ambulatory Visit: Payer: Self-pay | Admitting: Internal Medicine

## 2022-08-24 ENCOUNTER — Ambulatory Visit: Payer: Medicare Other | Admitting: Neurology

## 2022-08-24 NOTE — Telephone Encounter (Signed)
Hi Jade Cardenas   It is certainly possible that the symptoms you are having could be from medication or transition / change of medication   Although can improve over time ( weeks). I would discuss with prescribers about these sx .  WP

## 2022-08-26 ENCOUNTER — Encounter: Payer: Self-pay | Admitting: Rheumatology

## 2022-08-26 ENCOUNTER — Other Ambulatory Visit: Payer: Self-pay

## 2022-08-26 ENCOUNTER — Ambulatory Visit: Payer: Medicare Other | Attending: Rheumatology | Admitting: Rheumatology

## 2022-08-26 VITALS — BP 134/75 | HR 102 | Resp 16 | Ht 65.0 in | Wt 195.0 lb

## 2022-08-26 DIAGNOSIS — M19042 Primary osteoarthritis, left hand: Secondary | ICD-10-CM

## 2022-08-26 DIAGNOSIS — M19071 Primary osteoarthritis, right ankle and foot: Secondary | ICD-10-CM | POA: Diagnosis not present

## 2022-08-26 DIAGNOSIS — M8589 Other specified disorders of bone density and structure, multiple sites: Secondary | ICD-10-CM | POA: Diagnosis not present

## 2022-08-26 DIAGNOSIS — Z872 Personal history of diseases of the skin and subcutaneous tissue: Secondary | ICD-10-CM

## 2022-08-26 DIAGNOSIS — D508 Other iron deficiency anemias: Secondary | ICD-10-CM | POA: Diagnosis not present

## 2022-08-26 DIAGNOSIS — Z79899 Other long term (current) drug therapy: Secondary | ICD-10-CM | POA: Diagnosis not present

## 2022-08-26 DIAGNOSIS — M5030 Other cervical disc degeneration, unspecified cervical region: Secondary | ICD-10-CM | POA: Diagnosis not present

## 2022-08-26 DIAGNOSIS — M5136 Other intervertebral disc degeneration, lumbar region: Secondary | ICD-10-CM

## 2022-08-26 DIAGNOSIS — L4050 Arthropathic psoriasis, unspecified: Secondary | ICD-10-CM | POA: Diagnosis not present

## 2022-08-26 DIAGNOSIS — M19041 Primary osteoarthritis, right hand: Secondary | ICD-10-CM | POA: Diagnosis not present

## 2022-08-26 DIAGNOSIS — M797 Fibromyalgia: Secondary | ICD-10-CM | POA: Diagnosis not present

## 2022-08-26 DIAGNOSIS — Z96652 Presence of left artificial knee joint: Secondary | ICD-10-CM | POA: Diagnosis not present

## 2022-08-26 DIAGNOSIS — M19072 Primary osteoarthritis, left ankle and foot: Secondary | ICD-10-CM

## 2022-08-26 DIAGNOSIS — M1711 Unilateral primary osteoarthritis, right knee: Secondary | ICD-10-CM | POA: Diagnosis not present

## 2022-08-26 DIAGNOSIS — R5382 Chronic fatigue, unspecified: Secondary | ICD-10-CM

## 2022-08-26 DIAGNOSIS — G629 Polyneuropathy, unspecified: Secondary | ICD-10-CM | POA: Diagnosis not present

## 2022-08-26 LAB — CBC WITH DIFFERENTIAL/PLATELET
HCT: 39.1 % (ref 35.0–45.0)
MCH: 32.7 pg (ref 27.0–33.0)
MCV: 95.4 fL (ref 80.0–100.0)
Platelets: 365 10*3/uL (ref 140–400)

## 2022-08-26 MED ORDER — CLOBETASOL PROPIONATE 0.05 % EX CREA: 1.0000 | TOPICAL_CREAM | Freq: Two times a day (BID) | CUTANEOUS | 0 refills | Status: DC

## 2022-08-26 NOTE — Telephone Encounter (Signed)
Patient contacted the office and states she saw Dr. Corliss Skains this afternoon and got Clobetasol Cream sent to [facility] on [address]. Patient states the cream is far too expensive and inquires if there is something else comparable that can be sent into her pharmacy. Patient call back number is [phone]. Please advise.

## 2022-08-26 NOTE — Telephone Encounter (Signed)
I agree the [facility]  is large and a problem certainly a surgical consult is reasonable , can cause cp pressure nausea  or nothing

## 2022-08-26 NOTE — Patient Instructions (Signed)
Standing Labs We placed an order today for your standing lab work.   Please have your standing labs drawn in September and every 3 months  Please have your labs drawn 2 weeks prior to your appointment so that the provider can discuss your lab results at your appointment, if possible.  Please note that you may see your imaging and lab results in MyChart before we have reviewed them. We will contact you once all results are reviewed. Please allow our office up to 72 hours to thoroughly review all of the results before contacting the office for clarification of your results.  WALK-IN LAB HOURS  Monday through Thursday from 8:00 am -12:30 pm and 1:00 pm-5:00 pm and Friday from 8:00 am-12:00 pm.  Patients with office visits requiring labs will be seen before walk-in labs.  You may encounter longer than normal wait times. Please allow additional time. Wait times may be shorter on  Monday and Thursday afternoons.  We do not book appointments for walk-in labs. We appreciate your patience and understanding with our staff.   Labs are drawn by [facility]. Please bring your co-pay at the time of your lab draw.  You may receive a bill from [facility] for your lab work.  Please note if you are on Hydroxychloroquine and and an order has been placed for a Hydroxychloroquine level,  you will need to have it drawn 4 hours or more after your last dose.  If you wish to have your labs drawn at another location, please call the office 24 hours in advance so we can fax the orders.  The office is located at [address], [address], [place], [place] [address]   If you have any questions regarding directions or hours of operation,  please call [phone].   As a reminder, please drink plenty of water prior to coming for your lab work. Thanks!   Vaccines You are taking a medication(s) that can suppress your immune system.  The following immunizations are recommended: Flu annually Covid-19  RSV Td/Tdap (tetanus,  diphtheria, pertussis) every 10 years Pneumonia (Prevnar 15 then Pneumovax 23 at least 1 year apart.  Alternatively, can take Prevnar 20 without needing additional dose) Shingrix: 2 doses from 4 weeks to 6 months apart  Please check with your PCP to make sure you are up to date.   If you have signs or symptoms of an infection or start antibiotics: First, call your PCP for workup of your infection. Hold your medication through the infection, until you complete your antibiotics, and until symptoms resolve if you take the following: Injectable medication (Actemra, Benlysta, Cimzia, Cosentyx, Enbrel, Humira, Kevzara, Orencia, Remicade, Simponi, Stelara, Taltz, Tremfya) Methotrexate Leflunomide (Arava) Mycophenolate (Cellcept) Harriette Ohara, Olumiant, or Rinvoq  Please a stop Simponi injection 1 month prior to the surgery and methotrexate 1 week before the surgery you may restart Simponi injections and methotrexate 2 weeks after the surgery if there is no infection and you have clearance by the surgeon.  Please get an annual skin examination to screen for skin cancer while you are on Simponi.  Please use sunscreen and sun protection.

## 2022-08-27 LAB — COMPLETE METABOLIC PANEL WITH GFR
AG Ratio: 1.6 (calc) (ref 1.0–2.5)
ALT: 13 U/L (ref 6–29)
AST: 18 U/L (ref 10–35)
Albumin: 4.6 g/dL (ref 3.6–5.1)
Alkaline phosphatase (APISO): 93 U/L (ref 37–153)
BUN: 22 mg/dL (ref 7–25)
CO2: 23 mmol/L (ref 20–32)
Calcium: 10.3 mg/dL (ref 8.6–10.4)
Chloride: 104 mmol/L (ref 98–110)
Creat: 0.74 mg/dL (ref 0.60–1.00)
Globulin: 2.8 g/dL (calc) (ref 1.9–3.7)
Glucose, Bld: 115 mg/dL — ABNORMAL HIGH (ref 65–99)
Potassium: 4.4 mmol/L (ref 3.5–5.3)
Sodium: 140 mmol/L (ref 135–146)
Total Bilirubin: 0.6 mg/dL (ref 0.2–1.2)
Total Protein: 7.4 g/dL (ref 6.1–8.1)
eGFR: 87 mL/min/{1.73_m2} (ref >=60)

## 2022-08-27 LAB — CBC WITH DIFFERENTIAL/PLATELET
Absolute Monocytes: 390 cells/uL (ref 200–950)
Basophils Absolute: 59 cells/uL (ref 0–200)
Basophils Relative: 0.9 %
Eosinophils Absolute: 59 cells/uL (ref 15–500)
Eosinophils Relative: 0.9 %
Hemoglobin: 13.4 g/dL (ref 11.7–15.5)
Lymphs Abs: 1911 cells/uL (ref 850–3900)
MCHC: 34.3 g/dL (ref 32.0–36.0)
MPV: 10.4 fL (ref 7.5–12.5)
Monocytes Relative: 6 %
Neutro Abs: 4082 cells/uL (ref 1500–7800)
Neutrophils Relative %: 62.8 %
RBC: 4.1 10*6/uL (ref 3.80–5.10)
RDW: 13.4 % (ref 11.0–15.0)
Total Lymphocyte: 29.4 %
WBC: 6.5 10*3/uL (ref 3.8–10.8)

## 2022-08-27 LAB — FERRITIN: Ferritin: 24 ng/mL (ref 16–288)

## 2022-08-27 MED ORDER — TRIAMCINOLONE ACETONIDE 0.5 % EX CREA: 1.0000 | TOPICAL_CREAM | Freq: Three times a day (TID) | CUTANEOUS | 0 refills | Status: DC

## 2022-08-27 NOTE — Progress Notes (Signed)
Contacted pt per Dr Candise Che to: see if she can come in for 1 additional dose of feraheme to build her Iron store her ferritin is still below her goal of 100.  And then she can keep her appointments for September for f/u  Pt agreeable to another dose of iron and verbalized understanding of plan.

## 2022-08-27 NOTE — Telephone Encounter (Signed)
Please review and sign pended triamcinolone rx. Patient is aware we are sending the prescription. Thanks!

## 2022-08-27 NOTE — Progress Notes (Signed)
Ferritin improved at 24 (low normal).  CBC normal, CMP normal.  Please forward results to her hematologist.

## 2022-08-27 NOTE — Addendum Note (Signed)
Addended by: Ellen Henri on: 08/27/2022 09:38 AM   Modules accepted: Orders

## 2022-08-27 NOTE — Telephone Encounter (Signed)
Clobetasol cream is expensive.  Please send a prescription for triamcinolone cream.

## 2022-08-28 ENCOUNTER — Telehealth: Payer: Self-pay | Admitting: Hematology

## 2022-09-10 ENCOUNTER — Inpatient Hospital Stay: Payer: Medicare Other | Attending: Hematology

## 2022-09-10 ENCOUNTER — Other Ambulatory Visit: Payer: Self-pay | Admitting: Hematology

## 2022-09-10 VITALS — BP 120/66 | HR 73 | Temp 98.1°F | Resp 18 | Wt 198.4 lb

## 2022-09-10 DIAGNOSIS — E611 Iron deficiency: Secondary | ICD-10-CM

## 2022-09-10 DIAGNOSIS — D509 Iron deficiency anemia, unspecified: Secondary | ICD-10-CM | POA: Insufficient documentation

## 2022-09-10 MED ORDER — LORATADINE 10 MG PO TABS: 10.0000 mg | ORAL_TABLET | Freq: Every day | ORAL | Status: DC

## 2022-09-10 MED ORDER — ACETAMINOPHEN 325 MG PO TABS: 650.0000 mg | ORAL_TABLET | Freq: Once | ORAL | Status: DC

## 2022-09-10 MED ORDER — SODIUM CHLORIDE 0.9 % IV SOLN
510.0000 mg | Freq: Once | INTRAVENOUS | Status: AC
  Administered 2022-09-10: 510 mg via INTRAVENOUS
  Filled 2022-09-10: qty 510

## 2022-09-10 MED ORDER — SODIUM CHLORIDE 0.9 % IV SOLN: Freq: Once | INTRAVENOUS | Status: AC

## 2022-09-10 NOTE — Patient Instructions (Signed)

## 2022-09-17 DIAGNOSIS — K449 Diaphragmatic hernia without obstruction or gangrene: Secondary | ICD-10-CM | POA: Diagnosis not present

## 2022-09-17 DIAGNOSIS — K219 Gastro-esophageal reflux disease without esophagitis: Secondary | ICD-10-CM | POA: Diagnosis not present

## 2022-09-29 DIAGNOSIS — K449 Diaphragmatic hernia without obstruction or gangrene: Secondary | ICD-10-CM | POA: Diagnosis not present

## 2022-09-29 DIAGNOSIS — K219 Gastro-esophageal reflux disease without esophagitis: Secondary | ICD-10-CM | POA: Diagnosis not present

## 2022-10-09 ENCOUNTER — Ambulatory Visit (INDEPENDENT_AMBULATORY_CARE_PROVIDER_SITE_OTHER): Payer: Medicare Other | Admitting: Podiatry

## 2022-10-09 ENCOUNTER — Encounter: Payer: Self-pay | Admitting: Neurology

## 2022-10-09 DIAGNOSIS — G629 Polyneuropathy, unspecified: Secondary | ICD-10-CM | POA: Diagnosis not present

## 2022-10-09 DIAGNOSIS — M19071 Primary osteoarthritis, right ankle and foot: Secondary | ICD-10-CM | POA: Diagnosis not present

## 2022-10-09 DIAGNOSIS — M722 Plantar fascial fibromatosis: Secondary | ICD-10-CM

## 2022-10-09 NOTE — Progress Notes (Signed)
Subjective:  Patient ID: Jade Cardenas, female    DOB: [birth year],  MRN: [id number]  Chief Complaint  Patient presents with   Foot Pain    C/o foot pain due to small fiber neuropathy. Patient is not taking the Cymbalta or Lyrica that was prescribed due to side effects.     72 y.o. female presents with concern for pain due to small fiber neuropathy and bilateral foot.  Does have history of neuropathy that she has been diagnosed with previously has tried Lyrica and Cymbalta.  Cannot take due to side effects.  She is asking if there is anything else agreed on for neuropathy that we have available.  She also notes that she has had steroid injections in the past and is having left heel pain at this time as well as a right forefoot pain.  She says the steroid injections have helped with her pain in the past.  Past Medical History:  Diagnosis Date   Anemia    iron def   Arthritis    rheumatoid, psoriatic, osteoarthritis    Bone spur    RIGHT FOOT    Broken toe    Bruises easily    Cellulitis    Chronic back pain    COLONIC POLYPS, HX OF 02/09/2007   Cough, persistent 01/21/2012   poss from acei    DES exposure in utero, unknown    Femur fracture, left (HCC)    Fibromyalgia    doesn't require meds   GASTRIC ULCER, ACUTE, HEMORRHAGE, HX OF 02/09/2007   GERD (gastroesophageal reflux disease)    takes Nexium daily   Goiter    H/O hiatal hernia    H/O: hysterectomy    when 72 yrs old   Hx of seasonal allergies    takes Claritin daily   HYPERLIPIDEMIA 12/29/2006   takes Lovastatin nightly   HYPERTENSION 12/29/2006   takes Enalapril daily   IBS 04/28/2007   Insomnia    related to pain;takes Flexeril and Tylenol PM nightly   Joint pain    Joint swelling    Neuropathy    small cell   Osteopenia    Palpitations 02/09/2007   PONV (postoperative nausea and vomiting)    NAUSEA   REDUCTION MAMMOPLASTY, HX OF 02/09/2007   Right bundle branch block 02/09/2007   S/P lumbar fusion  6 13 09/03/2011   L4 L5  posterior    SYNCOPE 12/07/2007   Tendonitis    RLE    UNS ADVRS EFF OTH RX MEDICINAL\T\BIOLOGICAL SBSTNC 02/09/2007    Allergies  Allergen Reactions   Erythromycin Hives, Swelling and Other (See Comments)    All Mycins; throat swells shut and she gets "really hot"   Iodinated Contrast Media Shortness Of Breath and Swelling    CT contrast; throat swelling, difficulty breathing.  In [place] "many years ago."   Ace Inhibitors Cough   Indomethacin Other (See Comments)   Keflex [Cephalexin] Hives   Macrobid [Nitrofurantoin] Other (See Comments)    dizziness    ROS: Negative except as per HPI above  Objective:  General: AAO x3, NAD  Dermatological: With inspection and palpation of the right and left lower extremities there are no open sores, no preulcerative lesions, no rash or signs of infection present. Nails are of normal length thickness and coloration.   Vascular:  Dorsalis Pedis artery and Posterior Tibial artery pedal pulses are 2/4 bilateral.  Capillary fill time < 3 sec to all digits.   Neruologic: Grossly intact via  light touch bilateral. Protective threshold intact to all sites bilateral.   Musculoskeletal: Pain about the right first metatarsophalangeal admissions have arthritic changes noted in the osseous prominence about.  Also pain in the plantar aspect of the left heel near the medial calcaneal tubercle of the calcaneus with palpation.  Gait: Unassisted, Nonantalgic.   No images are attached to the encounter.  Radiographs:  Deferred Assessment:   1. Small fiber neuropathy   2. Arthritis of foot, right   3. Plantar fasciitis, left      Plan:  Patient was evaluated and treated and all questions answered.  # Small fiber neuropathy bilateral foot -Discussed with patient I do recommend capsaicin treatments including Qutenza prescription treatment -Discussed we do not offer this at this office at this time and recommend she follow-up  with her neurologist to see if they provide Qutenza application -I certify that this diagnosis represents a distinct and separate diagnosis that requires evaluation and treatment separate from other procedures or diagnosis   # Arthritis of right foot -Recommend steroid injection about the first metatarsal and joint for pain relief -After sterile prep injected 1 cc half some Marcaine plain with 1 cc Kenalog 10 and the medial aspect of the first MPJ on the right foot with good effect. -Follow-up as needed for this problem  # Left plantar heel pain -Discussed with patient evidence of plantar fasciitis or bursitis recommend steroid injection -After sterile prep injected 1 cc half percent Marcaine plain and 1 cc Kenalog 10 to the plantar medial aspect of the left heel along the plantar fascia medial approach.  Patient tolerated well -Recommend good supportive shoes and stretching exercises for the left calf  Return if symptoms worsen or fail to improve.          Corinna Gab, DPM [facility]

## 2022-10-13 DIAGNOSIS — K219 Gastro-esophageal reflux disease without esophagitis: Secondary | ICD-10-CM | POA: Diagnosis not present

## 2022-10-13 DIAGNOSIS — R1319 Other dysphagia: Secondary | ICD-10-CM | POA: Diagnosis not present

## 2022-10-13 DIAGNOSIS — K449 Diaphragmatic hernia without obstruction or gangrene: Secondary | ICD-10-CM | POA: Diagnosis not present

## 2022-10-15 ENCOUNTER — Encounter (INDEPENDENT_AMBULATORY_CARE_PROVIDER_SITE_OTHER): Payer: Self-pay

## 2022-10-16 ENCOUNTER — Ambulatory Visit: Payer: Medicare Other | Admitting: Podiatry

## 2022-10-19 DIAGNOSIS — K259 Gastric ulcer, unspecified as acute or chronic, without hemorrhage or perforation: Secondary | ICD-10-CM | POA: Diagnosis not present

## 2022-10-19 DIAGNOSIS — K449 Diaphragmatic hernia without obstruction or gangrene: Secondary | ICD-10-CM | POA: Diagnosis not present

## 2022-10-19 DIAGNOSIS — K219 Gastro-esophageal reflux disease without esophagitis: Secondary | ICD-10-CM | POA: Diagnosis not present

## 2022-10-19 DIAGNOSIS — K317 Polyp of stomach and duodenum: Secondary | ICD-10-CM | POA: Diagnosis not present

## 2022-10-19 DIAGNOSIS — K3189 Other diseases of stomach and duodenum: Secondary | ICD-10-CM | POA: Diagnosis not present

## 2022-10-19 DIAGNOSIS — D509 Iron deficiency anemia, unspecified: Secondary | ICD-10-CM | POA: Diagnosis not present

## 2022-10-21 ENCOUNTER — Other Ambulatory Visit: Payer: Self-pay | Admitting: Physician Assistant

## 2022-10-21 NOTE — Telephone Encounter (Signed)
Last Fill: 08/06/2022  Labs: 08/26/2022 Ferritin improved at 24 (low normal).  CBC normal, CMP normal.   TB Gold: 10/01/2021 Neg   Next Visit: 02/10/2023  Last Visit: 08/26/2022  EX:[id number] arthritis   Current Dose per office note 08/26/2022: Simponi 50 mg every 28 days   Patient to update TB Gold with standing labs in September   Okay to refill Simponi?

## 2022-10-28 ENCOUNTER — Other Ambulatory Visit (HOSPITAL_BASED_OUTPATIENT_CLINIC_OR_DEPARTMENT_OTHER): Payer: Self-pay | Admitting: Obstetrics & Gynecology

## 2022-10-28 DIAGNOSIS — Z1231 Encounter for screening mammogram for malignant neoplasm of breast: Secondary | ICD-10-CM

## 2022-11-04 ENCOUNTER — Other Ambulatory Visit (INDEPENDENT_AMBULATORY_CARE_PROVIDER_SITE_OTHER): Payer: Medicare Other

## 2022-11-04 DIAGNOSIS — D649 Anemia, unspecified: Secondary | ICD-10-CM | POA: Diagnosis not present

## 2022-11-04 DIAGNOSIS — E611 Iron deficiency: Secondary | ICD-10-CM

## 2022-11-04 DIAGNOSIS — R739 Hyperglycemia, unspecified: Secondary | ICD-10-CM | POA: Diagnosis not present

## 2022-11-04 LAB — CBC WITH DIFFERENTIAL/PLATELET
Basophils Absolute: 0.1 10*3/uL (ref 0.0–0.1)
Basophils Relative: 1.1 % (ref 0.0–3.0)
Eosinophils Absolute: 0.1 10*3/uL (ref 0.0–0.7)
Eosinophils Relative: 1.7 % (ref 0.0–5.0)
HCT: 41.2 % (ref 36.0–46.0)
Hemoglobin: 13.7 g/dL (ref 12.0–15.0)
Lymphocytes Relative: 30.5 % (ref 12.0–46.0)
Lymphs Abs: 1.8 10*3/uL (ref 0.7–4.0)
MCHC: 33.2 g/dL (ref 30.0–36.0)
MCV: 99.2 fl (ref 78.0–100.0)
Monocytes Absolute: 0.4 10*3/uL (ref 0.1–1.0)
Monocytes Relative: 6.8 % (ref 3.0–12.0)
Neutro Abs: 3.6 10*3/uL (ref 1.4–7.7)
Neutrophils Relative %: 59.9 % (ref 43.0–77.0)
Platelets: 318 10*3/uL (ref 150.0–400.0)
RBC: 4.16 Mil/uL (ref 3.87–5.11)
RDW: 14.3 % (ref 11.5–15.5)
WBC: 6 10*3/uL (ref 4.0–10.5)

## 2022-11-04 LAB — HEMOGLOBIN A1C: Hgb A1c MFr Bld: 5.6 % (ref 4.6–6.5)

## 2022-11-04 LAB — VITAMIN B12: Vitamin B-12: 554 pg/mL (ref 211–911)

## 2022-11-05 LAB — IRON,TIBC AND FERRITIN PANEL
%SAT: 22 % (ref 16–45)
Ferritin: 58 ng/mL (ref 16–288)
Iron: 77 ug/dL (ref 45–160)
TIBC: 343 ug/dL (ref 250–450)

## 2022-11-09 ENCOUNTER — Inpatient Hospital Stay: Payer: Medicare Other | Attending: Hematology | Admitting: Hematology

## 2022-11-09 DIAGNOSIS — K317 Polyp of stomach and duodenum: Secondary | ICD-10-CM | POA: Insufficient documentation

## 2022-11-09 DIAGNOSIS — Z8049 Family history of malignant neoplasm of other genital organs: Secondary | ICD-10-CM | POA: Insufficient documentation

## 2022-11-09 DIAGNOSIS — Z8249 Family history of ischemic heart disease and other diseases of the circulatory system: Secondary | ICD-10-CM | POA: Diagnosis not present

## 2022-11-09 DIAGNOSIS — Z800 Family history of malignant neoplasm of digestive organs: Secondary | ICD-10-CM | POA: Insufficient documentation

## 2022-11-09 DIAGNOSIS — R5383 Other fatigue: Secondary | ICD-10-CM | POA: Insufficient documentation

## 2022-11-09 DIAGNOSIS — M797 Fibromyalgia: Secondary | ICD-10-CM | POA: Insufficient documentation

## 2022-11-09 DIAGNOSIS — Z8261 Family history of arthritis: Secondary | ICD-10-CM | POA: Insufficient documentation

## 2022-11-09 DIAGNOSIS — E611 Iron deficiency: Secondary | ICD-10-CM | POA: Diagnosis not present

## 2022-11-09 DIAGNOSIS — M8580 Other specified disorders of bone density and structure, unspecified site: Secondary | ICD-10-CM | POA: Insufficient documentation

## 2022-11-09 DIAGNOSIS — Z90710 Acquired absence of both cervix and uterus: Secondary | ICD-10-CM | POA: Diagnosis not present

## 2022-11-09 DIAGNOSIS — Z90722 Acquired absence of ovaries, bilateral: Secondary | ICD-10-CM | POA: Insufficient documentation

## 2022-11-09 DIAGNOSIS — R531 Weakness: Secondary | ICD-10-CM | POA: Diagnosis not present

## 2022-11-09 DIAGNOSIS — Z9049 Acquired absence of other specified parts of digestive tract: Secondary | ICD-10-CM | POA: Diagnosis not present

## 2022-11-09 DIAGNOSIS — Z83438 Family history of other disorder of lipoprotein metabolism and other lipidemia: Secondary | ICD-10-CM | POA: Insufficient documentation

## 2022-11-09 DIAGNOSIS — Z881 Allergy status to other antibiotic agents status: Secondary | ICD-10-CM | POA: Insufficient documentation

## 2022-11-09 DIAGNOSIS — K449 Diaphragmatic hernia without obstruction or gangrene: Secondary | ICD-10-CM | POA: Insufficient documentation

## 2022-11-09 DIAGNOSIS — Z8719 Personal history of other diseases of the digestive system: Secondary | ICD-10-CM | POA: Diagnosis not present

## 2022-11-09 DIAGNOSIS — Z823 Family history of stroke: Secondary | ICD-10-CM | POA: Insufficient documentation

## 2022-11-09 DIAGNOSIS — Z8349 Family history of other endocrine, nutritional and metabolic diseases: Secondary | ICD-10-CM | POA: Insufficient documentation

## 2022-11-09 DIAGNOSIS — R61 Generalized hyperhidrosis: Secondary | ICD-10-CM | POA: Insufficient documentation

## 2022-11-09 DIAGNOSIS — Z79899 Other long term (current) drug therapy: Secondary | ICD-10-CM | POA: Insufficient documentation

## 2022-11-09 DIAGNOSIS — R509 Fever, unspecified: Secondary | ICD-10-CM | POA: Diagnosis not present

## 2022-11-09 DIAGNOSIS — Z8711 Personal history of peptic ulcer disease: Secondary | ICD-10-CM | POA: Insufficient documentation

## 2022-11-09 DIAGNOSIS — D509 Iron deficiency anemia, unspecified: Secondary | ICD-10-CM | POA: Insufficient documentation

## 2022-11-09 DIAGNOSIS — E538 Deficiency of other specified B group vitamins: Secondary | ICD-10-CM | POA: Insufficient documentation

## 2022-11-09 DIAGNOSIS — I10 Essential (primary) hypertension: Secondary | ICD-10-CM | POA: Insufficient documentation

## 2022-11-09 DIAGNOSIS — Z833 Family history of diabetes mellitus: Secondary | ICD-10-CM | POA: Insufficient documentation

## 2022-11-09 NOTE — Progress Notes (Signed)
Jade Cardenas Kitchen    HEMATOLOGY/ONCOLOGY CLINIC NOTE  Date of Service:  11/09/22   Patient Care Team: Madelin Headings, MD as PCP - General Hyacinth Meeker Elnita Maxwell, MD (Gynecology) Pollyann Savoy, MD as Consulting Physician (Rheumatology) Johney Maine, MD as Consulting Physician (Hematology) Ollen Gross, MD as Consulting Physician (Orthopedic Surgery) Glendale Chard, DO as Consulting Physician (Neurology)  CHIEF COMPLAINTS/PURPOSE OF CONSULTATION: f/u for Iron deficiency anemia  Diagnosis severe iron deficiency anemia + ACD (due to psoariasis) Oral iron intolerance  Treatment IV Iron as needed (received IV feraheme 08/29/15, 08/22/2015, 06/04/2015, 05/28/2015)   INTERVAL HISTORY  ..I connected with Jade Cardenas on 11/09/2022 at  3:30 PM EDT by telephone visit and verified that I am speaking with the correct person using two identifiers.   I had a phone visit with the patient on 05/11/2022 and she complained of increased fatigue, one episode of severe body weakness which caused a fall, occasional fever, and occasional night sweats.  Patient notes she has been doing well overall since our last visit. She notes she had endoscopy last month with her gastroenterologist. Endoscopy showed increased size of her hiatal hernia and Cameron ulcers from her last endoscopy. She is trying to decide if she wants to get an surgery or just take the conservative approach. She regularly follows-up with her Gastroenterologist. Patient has started taking Protonix and has been tolerating it without any new or severe toxicities.   She denies any new infection issues, fever, chills, night sweats, unexpected weight loss, abdominal pain, chest pain, back pain, or leg swelling. She does complain of mild fatigue.   I discussed the limitations, risks, security and privacy concerns of performing an evaluation and management service by telemedicine and the availability of in-person appointments. I also discussed with the  patient that there may be a patient responsible charge related to this service. The patient expressed understanding and agreed to proceed.   Other persons participating in the visit and their role in the encounter: None  Patient's location: Home Provider's location: [facility]  Chief Complaint: Follow-up for management of iron deficiency anemia     MEDICAL HISTORY:  Past Medical History:  Diagnosis Date   Anemia    iron def   Arthritis    rheumatoid, psoriatic, osteoarthritis    Bone spur    RIGHT FOOT    Broken toe    Bruises easily    Cellulitis    Chronic back pain    COLONIC POLYPS, HX OF 02/09/2007   Cough, persistent 01/21/2012   poss from acei    DES exposure in utero, unknown    Femur fracture, left (HCC)    Fibromyalgia    doesn't require meds   GASTRIC ULCER, ACUTE, HEMORRHAGE, HX OF 02/09/2007   GERD (gastroesophageal reflux disease)    takes Nexium daily   Goiter    H/O hiatal hernia    H/O: hysterectomy    when 72 yrs old   Hx of seasonal allergies    takes Claritin daily   HYPERLIPIDEMIA 12/29/2006   takes Lovastatin nightly   HYPERTENSION 12/29/2006   takes Enalapril daily   IBS 04/28/2007   Insomnia    related to pain;takes Flexeril and Tylenol PM nightly   Joint pain    Joint swelling    Neuropathy    small cell   Osteopenia    Palpitations 02/09/2007   PONV (postoperative nausea and vomiting)    NAUSEA   REDUCTION MAMMOPLASTY, HX OF 02/09/2007  Right bundle branch block 02/09/2007   S/P lumbar fusion 6 13 09/03/2011   L4 L5  posterior    SYNCOPE 12/07/2007   Tendonitis    RLE    UNS ADVRS EFF OTH RX MEDICINAL\T\BIOLOGICAL SBSTNC 02/09/2007    SURGICAL HISTORY: Past Surgical History:  Procedure Laterality Date   BACK SURGERY  12+yrs ago   Synovial Cyst removal   BACK SURGERY  6/13   lumbar L4-L5 fusion, lamenectomy   bone spur  6-93yrs ago   right ankle   BUNIONECTOMY  10/03/14   right foot   CERVICAL FUSION      2 12    CHOLECYSTECTOMY  1996   HARDWARE REMOVAL Left 08/01/2018   Procedure: Left knee hardware removal;  Surgeon: Ollen Gross, MD;  Location: [facility];  Service: Orthopedics;  Laterality: Left;    JOINT REPLACEMENT     KNEE ARTHROPLASTY     POSTERIOR CERVICAL LAMINECTOMY Left 07/30/2015   Procedure: Laminectomy and Foraminotomy - left - Cervical two -Cervical three;  Surgeon: Julio Sicks, MD;  Location: [facility];  Service: Neurosurgery;  Laterality: Left;   REDUCTION MAMMAPLASTY Bilateral 1989   TONSILLECTOMY     as a child   TOTAL ABDOMINAL HYSTERECTOMY W/ BILATERAL SALPINGOOPHORECTOMY  1986   TOTAL KNEE ARTHROPLASTY N/A 06/21/2017   Procedure: LEFT TOTAL KNEE ARTHROPLASTY AND RIGHT KNEE CORTISONE INJECTION;  Surgeon: Ollen Gross, MD;  Location: [facility];  Service: Orthopedics;  Laterality: N/A;    SOCIAL HISTORY: Social History   Socioeconomic History   Marital status: Widowed    Spouse name: Not on file   Number of children: 0   Years of education: Not on file   Highest education level: Master's degree (e.g., MA, MS, MEng, MEd, MSW, MBA)  Occupational History   Not on file  Tobacco Use   Smoking status: Never    Passive exposure: Never   Smokeless tobacco: Never  Vaping Use   Vaping status: Never Used  Substance and Sexual Activity   Alcohol use: No    Alcohol/week: 0.0 standard drinks of alcohol   Drug use: No   Sexual activity: Not Currently    Partners: Male    Birth control/protection: Surgical    Comment: TAH/BSO  Other Topics Concern   Not on file  Social History Narrative   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  Husband died suddenly 2010Non smoker         Left handed    Lives in a one story home          Social Determinants of Health   Financial Resource Strain: Low Risk  (11/08/2022)   Overall Financial Resource Strain (CARDIA)    Difficulty of Paying Living Expenses: Not very hard   Food Insecurity: No Food Insecurity (11/08/2022)   Hunger Vital Sign    Worried About Running Out of Food in the Last Year: Never true    Ran Out of Food in the Last Year: Never true  Transportation Needs: No Transportation Needs (11/08/2022)   PRAPARE - Administrator, Civil Service (Medical): No    Lack of Transportation (Non-Medical): No  Physical Activity: Insufficiently Active (11/08/2022)   Exercise Vital Sign    Days of Exercise per Week: 7 days    Minutes of Exercise per Session: 20 min  Stress: Stress Concern Present (11/08/2022)   [employer] of Occupational Health - Occupational Stress Questionnaire  Feeling of Stress : To some extent  Social Connections: Moderately Integrated (11/08/2022)   Social Connection and Isolation Panel [NHANES]    Frequency of Communication with Friends and Family: Three times a week    Frequency of Social Gatherings with Friends and Family: More than three times a week    Attends Religious Services: 1 to 4 times per year    Active Member of [employer] or Organizations: Yes    Attends Banker Meetings: 1 to 4 times per year    Marital Status: Widowed  Intimate Partner Violence: Not At Risk (05/08/2022)   Humiliation, Afraid, Rape, and Kick questionnaire    Fear of Current or Ex-Partner: No    Emotionally Abused: No    Physically Abused: No    Sexually Abused: No    FAMILY HISTORY: Family History  Problem Relation Age of Onset   Diabetes Mother    Colon cancer Mother        x2   Arthritis Mother    Hypertension Mother    Endometrial cancer Mother    Stroke Father    Heart disease Father    Colon cancer Father    Arthritis Brother    Hypertension Brother    Hyperlipidemia Brother    Infertility Brother    Diabetes Brother    Other Other        DISH brother   Hypertension Maternal Grandmother    Stroke Maternal Grandmother    Anesthesia problems Neg Hx    Hypotension Neg Hx    Malignant hyperthermia Neg Hx     Pseudochol deficiency Neg Hx     ALLERGIES:  is allergic to erythromycin, iodinated contrast media, ace inhibitors, indomethacin, keflex [cephalexin], and macrobid [nitrofurantoin].  MEDICATIONS:  Current Outpatient Medications  Medication Sig Dispense Refill   acetaminophen (TYLENOL) 650 MG CR tablet Take 1,300 mg by mouth See admin instructions. Take 1300 mg in the morning and may take an additional 1300 mg dose as needed for pain     amLODipine (NORVASC) 5 MG tablet Take 0.5 tablets (2.5 mg total) by mouth daily. 90 tablet 0   B-D ALLERGY SYRINGE 1CC/28G 28G X 1/2" 1 ML MISC USE AS DIRECTED ONCE WEEKLY 12 each 3   Calcium Carb-Cholecalciferol (CALCIUM+D3 PO) Take 1 tablet by mouth daily.     clobetasol cream (TEMOVATE) 0.05 % Apply 1 Application topically 2 (two) times daily. 30 g 0   diphenhydramine-acetaminophen (TYLENOL PM) 25-500 MG TABS tablet Take 1 tablet by mouth at bedtime.     folic acid (FOLVITE) 1 MG tablet Take 1 tablet (1 mg total) by mouth daily. 90 tablet 3   losartan (COZAAR) 100 MG tablet TAKE 1 TABLET BY MOUTH EVERY DAY 90 tablet 0   lovastatin (MEVACOR) 20 MG tablet TAKE 1 TABLET BY MOUTH EVERY DAY IN THE EVENING 90 tablet 0   methotrexate 50 MG/2ML injection INJECT 0.3ML INTO THE SKIN ONCE WEEKLY 6 mL 0   Multiple Vitamins-Minerals (MULTIVITAMIN ADULTS PO) Take 1 tablet by mouth daily.      pregabalin (LYRICA) 50 MG capsule Take 1 tablet at bedtime x 2 weeks, then increase to 2 tablet at bedtime. 60 capsule 5   SIMPONI 50 MG/0.5ML SOAJ INJECT 50MG  UNDER THE SKIN ONCE MONTHLY 1 mL 4   triamcinolone cream (KENALOG) 0.5 % Apply 1 Application topically 3 (three) times daily. 30 g 0   Current Facility-Administered Medications  Medication Dose Route Frequency Provider Last Rate Last Admin  dexamethasone (DECADRON) injection 4 mg  4 mg Intra-articular Once Vivi Barrack, DPM        REVIEW OF SYSTEMS:   10 Point review of Systems was done is negative except as noted  above.  PHYSICAL EXAMINATION: Telemedicine visit  LABORATORY DATA:  I have reviewed the data as listed  .    Latest Ref Rng & Units 11/04/2022    9:45 AM 08/26/2022    2:42 PM 05/08/2022   10:37 AM  CBC  WBC 4.0 - 10.5 K/uL 6.0  6.5  5.1   Hemoglobin 12.0 - 15.0 g/dL 69.6  29.5  28.4   Hematocrit 36.0 - 46.0 % 41.2  39.1  31.0   Platelets 150.0 - 400.0 K/uL 318.0  365  413    . CBC    Component Value Date/Time   WBC 6.0 11/04/2022 0945   RBC 4.16 11/04/2022 0945   HGB 13.7 11/04/2022 0945   HGB 10.7 (L) 05/08/2022 1037   HGB 10.4 (L) 04/22/2022 0934   HGB 13.2 10/28/2016 1029   HCT 41.2 11/04/2022 0945   HCT 30.7 (L) 04/22/2022 0934   HCT 37.5 10/28/2016 1029   PLT 318.0 11/04/2022 0945   PLT 413 (H) 05/08/2022 1037   PLT 414 04/22/2022 0934   MCV 99.2 11/04/2022 0945   MCV 93 04/22/2022 0934   MCV 99.5 10/28/2016 1029   MCH 32.7 08/26/2022 1442   MCHC 33.2 11/04/2022 0945   RDW 14.3 11/04/2022 0945   RDW 13.3 04/22/2022 0934   RDW 12.7 10/28/2016 1029   LYMPHSABS 1.8 11/04/2022 0945   LYMPHSABS 1.6 04/22/2022 0934   LYMPHSABS 1.9 10/28/2016 1029   MONOABS 0.4 11/04/2022 0945   MONOABS 0.3 10/28/2016 1029   EOSABS 0.1 11/04/2022 0945   EOSABS 0.1 04/22/2022 0934   BASOSABS 0.1 11/04/2022 0945   BASOSABS 0.1 04/22/2022 0934   BASOSABS 0.0 10/28/2016 1029    . Lab Results  Component Value Date   RETICCTPCT 2.27 (H) 10/28/2016   RBC 4.16 11/04/2022   RETICCTABS 85.58 10/28/2016    .    Latest Ref Rng & Units 08/26/2022    2:42 PM 05/08/2022   10:37 AM 04/22/2022    9:34 AM  CMP  Glucose 65 - 99 mg/dL 132  440  76   BUN 7 - 25 mg/dL 22  22  21    Creatinine 0.60 - 1.00 mg/dL 1.02  7.25  3.66   Sodium 135 - 146 mmol/L 140  139  139   Potassium 3.5 - 5.3 mmol/L 4.4  4.3  5.2   Chloride 98 - 110 mmol/L 104  105  103   CO2 20 - 32 mmol/L 23  27  22    Calcium 8.6 - 10.4 mg/dL 44.0  9.5  34.7   Total Protein 6.1 - 8.1 g/dL 7.4  7.4  7.1   Total Bilirubin 0.2  - 1.2 mg/dL 0.6  0.6  0.4   Alkaline Phos 38 - 126 U/L  82  104   AST 10 - 35 U/L 18  15  15    ALT 6 - 29 U/L 13  12  14      Lab Results  Component Value Date   FERRITIN 58 11/04/2022    RADIOGRAPHIC STUDIES: I have personally reviewed the radiological images as listed and agreed with the findings in the report. No results found.  ASSESSMENT & PLAN:   Ms. Matis is a pleasant 72 year old female with  #1 Severe microcytic  hypochromic anemia  Due to iron deficiency. Patient's hemoglobin has responded very well to IV iron with improvement in her hemoglobin from 7.2 to 12.4 and now is stable at 13.2 with nl MCV . Ferritin levels were elevated to 800's post IV Iron and likely as an acute phase reactant.  #2 severe iron deficiency likely due to chronic GI bleeding + poor oral iron absorption. She has a EGD and colonoscopy in 2013.  Previous history of gastric ulcer requiring blood transfusions.  Also send gastric polyps and a large hernia and prone to developing Cameron ulcers, previous colonic polyps and previous bleeding hemorrhoids.  Patient reports having recent EGD and colonoscopy with Dr Kinnie Scales in April 2017 which showed a large gastric ulcer. She reports that it was noted to be benign and Helicobacter pylori negative. The reports of the studies and the pathology are not available to [place] at this time. Patient notes that she has had f/u EGD which showed that the GU has healed - EGD read not available to [place] currently.  #3 history of poor tolerance to oral iron with significant GI distress.  She is also on chronic PPI therapy that puts her at higher risk of developing iron deficiency due to poor absorption.  And also B12 deficiency and possible vitamin D deficiency.   PLAN: -Discussed lab results from 11/04/2022 with the patient. CBC is stable. Vitamin B-12 level at 554. Ferritin level at 58 with iron saturation of 22%. -Goal ferritin level for the patient is 100 and above.   -Discussed the option of continuing IV Iron infusion.  -Schedule patient for IV Iron infusion -- 1 doses of IV feraheme to target ferritin>100 -Answered all of patient's questions regarding surgery for her ulcers and other general questions.  -Continue to follow-up with PCP and Gastroenterologist. -RTC with PCP with iron labs and other labs.  -RTC with Dr. Candise Che as needed.   FOLLOW-UP: -RTC with PCP with iron labs and other labs.  -RTC with Dr. Candise Che as needed.   The total time spent in the appointment was 20 minutes* .  All of the patient's questions were answered with apparent satisfaction. The patient knows to call the clinic with any problems, questions or concerns.   Wyvonnia Lora MD MS AAHIVMS [facility] Hematology/Oncology Physician [facility]  .*Total Encounter Time as defined by the Centers for Medicare and Medicaid Services includes, in addition to the face-to-face time of a patient visit (documented in the note above) non-face-to-face time: obtaining and reviewing outside history, ordering and reviewing medications, tests or procedures, care coordination (communications with other health care professionals or caregivers) and documentation in the medical record.   I,Param Shah,acting as a Neurosurgeon for Wyvonnia Lora, MD.,have documented all relevant documentation on the behalf of Wyvonnia Lora, MD,as directed by  Wyvonnia Lora, MD while in the presence of Wyvonnia Lora, MD.  .I have reviewed the above documentation for accuracy and completeness, and I agree with the above. Johney Maine MD

## 2022-11-09 NOTE — Progress Notes (Signed)
Results in range has upcoming visits . Sharing with medical team

## 2022-11-10 ENCOUNTER — Ambulatory Visit (INDEPENDENT_AMBULATORY_CARE_PROVIDER_SITE_OTHER): Payer: Medicare Other | Admitting: Neurology

## 2022-11-10 ENCOUNTER — Encounter: Payer: Self-pay | Admitting: Neurology

## 2022-11-10 VITALS — BP 149/75 | HR 77 | Ht 65.0 in | Wt 202.0 lb

## 2022-11-10 DIAGNOSIS — R29898 Other symptoms and signs involving the musculoskeletal system: Secondary | ICD-10-CM

## 2022-11-10 DIAGNOSIS — G629 Polyneuropathy, unspecified: Secondary | ICD-10-CM

## 2022-11-10 NOTE — Progress Notes (Signed)
Follow-up Visit   Date: 11/10/2022    SILVESTRA KATE MRN: [id number] DOB: 04/17/1950    DEMANI ZULOAGA is a 72 y.o. left-handed female with psoriatic arthritis, hypertension, hyperlipidemia, s/p lumbar surgery  x 2 and cervical surgery x 2 returning to the clinic for follow-up of small fiber neuropathy.  The patient was accompanied to the clinic by self.   IMPRESSION/PLAN: Small fiber neuropathy confirmed by skin biopsy contributed by immune-mediated arthritis and age.  Previously tried:  Cymbalta, Lyrica - Nortriptyline declined  - Continue lidocaine ointment which provides relief  - She had questions regarding Qutenza which we do not offer.  If she would like to proceed with this, we can refer her to [facility]  Bilateral hand weakness>> paresthesias due to cervical canal stenosis and radiculopathy.  NCS/EMG did not show entrapment neuropathy.     -  Consider PT if symptoms progress  Return to clinic in 9 months  --------------------------------------------- History of present illness: Starting in late 2021, she began having numbness/tingling involving the soles and the lateral side of her right foot.  She also has similar symptoms over the soles of the left foot.  Symptoms are constant. She has some weakness in the right foot, especially with her toes.  Occasionally, she has shooting pain down the back of her right leg.   She has history two cervical and two lumbar surgeries by Dr. Dutch Quint.  She has no history of diabetes, alfcohol use, or chemotherapy.   UPDATE 07/28/2022:  Over the past several months, she has been having neck stiffness and pain radiating in to the left shoulder.  She continues to have burning pain in the feet and feels that Cymbalta does not help at all.  No interval falls or illnesses.   UPDATE 11/10/2022:  She is here for follow-up.  Her neuropathy remains unchanged.  She uses Salonpas ointment to the feet which provides her with adequate  relief.   Her hand tingling is less, she continues to have weakness.  She was unable to to PT because of concerns it could worsen her hiatal hernia. She has many questions regarding whether or not to proceed with hiatal hernia.  She has a lot of stress trying to make this decision which has intensified headaches.  She has tension headache a few times per month.  She takes excedrin which helps.    Medications:  Current Outpatient Medications on File Prior to Visit  Medication Sig Dispense Refill   acetaminophen (TYLENOL) 650 MG CR tablet Take 1,300 mg by mouth See admin instructions. Take 1300 mg in the morning and may take an additional 1300 mg dose as needed for pain     amLODipine (NORVASC) 5 MG tablet Take 0.5 tablets (2.5 mg total) by mouth daily. 90 tablet 0   B-D ALLERGY SYRINGE 1CC/28G 28G X 1/2" 1 ML MISC USE AS DIRECTED ONCE WEEKLY 12 each 3   Calcium Carb-Cholecalciferol (CALCIUM+D3 PO) Take 1 tablet by mouth daily.     diphenhydramine-acetaminophen (TYLENOL PM) 25-500 MG TABS tablet Take 1 tablet by mouth at bedtime.     folic acid (FOLVITE) 1 MG tablet Take 1 tablet (1 mg total) by mouth daily. 90 tablet 3   losartan (COZAAR) 100 MG tablet TAKE 1 TABLET BY MOUTH EVERY DAY 90 tablet 0   lovastatin (MEVACOR) 20 MG tablet TAKE 1 TABLET BY MOUTH EVERY DAY IN THE EVENING 90 tablet 0   methotrexate 50 MG/2ML injection INJECT 0.3ML INTO  THE SKIN ONCE WEEKLY 6 mL 0   Multiple Vitamins-Minerals (MULTIVITAMIN ADULTS PO) Take 1 tablet by mouth daily.      SIMPONI 50 MG/0.5ML SOAJ INJECT 50MG  UNDER THE SKIN ONCE MONTHLY 1 mL 4   Current Facility-Administered Medications on File Prior to Visit  Medication Dose Route Frequency Provider Last Rate Last Admin   dexamethasone (DECADRON) injection 4 mg  4 mg Intra-articular Once Vivi Barrack, DPM        Allergies:  Allergies  Allergen Reactions   Erythromycin Hives, Swelling and Other (See Comments)    All Mycins; throat swells shut and she  gets "really hot"   Iodinated Contrast Media Shortness Of Breath and Swelling    CT contrast; throat swelling, difficulty breathing.  In [place] "many years ago."   Ace Inhibitors Cough   Indomethacin Other (See Comments)   Keflex [Cephalexin] Hives   Macrobid [Nitrofurantoin] Other (See Comments)    dizziness    Vital Signs:  BP (!) 149/75   Pulse 77   Ht 5\' 5"  (1.651 m)   Wt 202 lb (91.6 kg)   LMP 03/02/1984   SpO2 99%   BMI 33.61 kg/m   Neurological Exam: MENTAL STATUS including orientation to time, place, person, recent and remote memory, attention span and concentration, language, and fund of knowledge is normal.  Speech is not dysarthric.  CRANIAL NERVES:   Pupils equal round and reactive to light.  Normal conjugate, extra-ocular eye movements in all directions of gaze.  Mild right ptosis.  Face is symmetric.   MOTOR:  Motor strength is 5/5 in all extremities.  No atrophy, fasciculations or abnormal movements.  No pronator drift.  Tone is normal.    MSRs:  Reflexes are 3+/4 throughout.  SENSORY:  Intact to vibration throughout.  COORDINATION/GAIT:  Gait narrow based and stable.   Data: MRI lumbar spine 02/26/2021: 1. Prior L4-L5 PLIF with new mild adjacent segment disease at L3-L4. No high-grade spinal canal or neuroforaminal stenosis at any level 2. Progressive severe bilateral facet arthropathy at L5-S1.   NCS/EMG of bilateral legs 04/03/2021:  Normal  NCS/EMG of bilateral arms 12/25/2021:   Chronic C5-C6 radiculopathy affecting bilateral upper extremities, mild. There is no evidence of carpal tunnel syndrome or ulnar neuropathy affecting either upper extremity.   Thank you for allowing me to participate in patient's care.  If I can answer any additional questions, I would be pleased to do so.    Sincerely,    Geoffery Aultman K. Allena Katz, DO

## 2022-11-11 NOTE — Progress Notes (Signed)
Chief Complaint  Patient presents with   Medical Management of Chronic Issues    6 months follow up. Pt wants to discuss eliminating her amlodipine BP med. Started pantoprazole from gastro. Had endoscopy    HPI: Jade Cardenas 72 y.o. come in for Chronic disease management  FU results labs  and plans.  Bp : controlled  see above   bp in teens systolic or 110/60 range   no syncope orthostatics but would like to see if dc amlodipine low dose would still have control  Ferritin  not high enough  as per Dr Candise Che since she has the risk of slow leak  HH large:   issues   10 cm   protonix  now  once a day to bid and continue.  No se  no sx  Saw Dr Charlyn Minerva.     Options to surgery intervention  having a hard time deciding  if she should proceed because she feels stable and uncertain at this time hosp   hx of cameron lesions  Arthritis  stable no change HLD refill lovastatin    ROS: See pertinent positives and negatives per HPI.  Gi sx in stress. No current cp sob resp sx ( see ct with bronchocele  otherwise no unexpected findings)   Past Medical History:  Diagnosis Date   Anemia    iron def   Arthritis    rheumatoid, psoriatic, osteoarthritis    Bone spur    RIGHT FOOT    Broken toe    Bruises easily    Cellulitis    Chronic back pain    COLONIC POLYPS, HX OF 02/09/2007   Cough, persistent 01/21/2012   poss from acei    DES exposure in utero, unknown    Femur fracture, left (HCC)    Fibromyalgia    doesn't require meds   GASTRIC ULCER, ACUTE, HEMORRHAGE, HX OF 02/09/2007   GERD (gastroesophageal reflux disease)    takes Nexium daily   Goiter    H/O hiatal hernia    H/O: hysterectomy    when 72 yrs old   Hx of seasonal allergies    takes Claritin daily   HYPERLIPIDEMIA 12/29/2006   takes Lovastatin nightly   HYPERTENSION 12/29/2006   takes Enalapril daily   IBS 04/28/2007   Insomnia    related to pain;takes Flexeril and Tylenol PM nightly   Joint pain    Joint  swelling    Neuropathy    small cell   Osteopenia    Palpitations 02/09/2007   PONV (postoperative nausea and vomiting)    NAUSEA   REDUCTION MAMMOPLASTY, HX OF 02/09/2007   Right bundle branch block 02/09/2007   S/P lumbar fusion 6 13 09/03/2011   L4 L5  posterior    SYNCOPE 12/07/2007   Tendonitis    RLE    UNS ADVRS EFF OTH RX MEDICINAL\T\BIOLOGICAL SBSTNC 02/09/2007    Family History  Problem Relation Age of Onset   Diabetes Mother    Colon cancer Mother        x2   Arthritis Mother    Hypertension Mother    Endometrial cancer Mother    Stroke Father    Heart disease Father    Colon cancer Father    Arthritis Brother    Hypertension Brother    Hyperlipidemia Brother    Infertility Brother    Diabetes Brother    Other Other        DISH brother  Hypertension Maternal Grandmother    Stroke Maternal Grandmother    Anesthesia problems Neg Hx    Hypotension Neg Hx    Malignant hyperthermia Neg Hx    Pseudochol deficiency Neg Hx     Social History   Socioeconomic History   Marital status: Widowed    Spouse name: Not on file   Number of children: 0   Years of education: Not on file   Highest education level: Master's degree (e.g., MA, MS, MEng, MEd, MSW, MBA)  Occupational History   Not on file  Tobacco Use   Smoking status: Never    Passive exposure: Never   Smokeless tobacco: Never  Vaping Use   Vaping status: Never Used  Substance and Sexual Activity   Alcohol use: No    Alcohol/week: 0.0 standard drinks of alcohol   Drug use: No   Sexual activity: Not Currently    Partners: Male    Birth control/protection: Surgical    Comment: TAH/BSO  Other Topics Concern   Not on file  Social History Narrative   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  Husband died suddenly 2010Non smoker         Left handed    Lives in a one story home          Social Determinants of Health   Financial Resource  Strain: Low Risk  (11/08/2022)   Overall Financial Resource Strain (CARDIA)    Difficulty of Paying Living Expenses: Not very hard  Food Insecurity: No Food Insecurity (11/08/2022)   Hunger Vital Sign    Worried About Running Out of Food in the Last Year: Never true    Ran Out of Food in the Last Year: Never true  Transportation Needs: No Transportation Needs (11/08/2022)   PRAPARE - Administrator, Civil Service (Medical): No    Lack of Transportation (Non-Medical): No  Physical Activity: Insufficiently Active (11/08/2022)   Exercise Vital Sign    Days of Exercise per Week: 7 days    Minutes of Exercise per Session: 20 min  Stress: Stress Concern Present (11/08/2022)   [employer] of Occupational Health - Occupational Stress Questionnaire    Feeling of Stress : To some extent  Social Connections: Moderately Integrated (11/08/2022)   Social Connection and Isolation Panel [NHANES]    Frequency of Communication with Friends and Family: Three times a week    Frequency of Social Gatherings with Friends and Family: More than three times a week    Attends Religious Services: 1 to 4 times per year    Active Member of [employer] or Organizations: Yes    Attends Banker Meetings: 1 to 4 times per year    Marital Status: Widowed    Outpatient Medications Prior to Visit  Medication Sig Dispense Refill   acetaminophen (TYLENOL) 650 MG CR tablet Take 1,300 mg by mouth See admin instructions. Take 1300 mg in the morning and may take an additional 1300 mg dose as needed for pain     amLODipine (NORVASC) 5 MG tablet Take 0.5 tablets (2.5 mg total) by mouth daily. 90 tablet 0   Calcium Carb-Cholecalciferol (CALCIUM+D3 PO) Take 1 tablet by mouth daily.     diphenhydramine-acetaminophen (TYLENOL PM) 25-500 MG TABS tablet Take 1 tablet by mouth at bedtime.     folic acid (FOLVITE) 1 MG tablet Take 1 tablet (1 mg total) by mouth daily. 90 tablet 3   methotrexate  50 MG/2ML injection INJECT  0.3ML INTO THE SKIN ONCE WEEKLY 6 mL 0   Multiple Vitamins-Minerals (MULTIVITAMIN ADULTS PO) Take 1 tablet by mouth daily.      pantoprazole (PROTONIX) 40 MG tablet Take 40 mg by mouth 2 (two) times daily.     SIMPONI 50 MG/0.5ML SOAJ INJECT 50MG  UNDER THE SKIN ONCE MONTHLY 1 mL 4   losartan (COZAAR) 100 MG tablet TAKE 1 TABLET BY MOUTH EVERY DAY 90 tablet 0   lovastatin (MEVACOR) 20 MG tablet TAKE 1 TABLET BY MOUTH EVERY DAY IN THE EVENING 90 tablet 0   B-D ALLERGY SYRINGE 1CC/28G 28G X 1/2" 1 ML MISC USE AS DIRECTED ONCE WEEKLY 12 each 3   Facility-Administered Medications Prior to Visit  Medication Dose Route Frequency Provider Last Rate Last Admin   dexamethasone (DECADRON) injection 4 mg  4 mg Intra-articular Once Vivi Barrack, DPM         EXAM:  BP 138/70 (BP Location: Left Arm, Patient Position: Sitting, Cuff Size: Large)   Pulse 69   Temp 97.9 F (36.6 C) (Oral)   Ht 5\' 5"  (1.651 m)   Wt 203 lb 3.2 oz (92.2 kg)   LMP 03/02/1984   SpO2 99%   BMI 33.81 kg/m   Body mass index is 33.81 kg/m.  GENERAL: vitals reviewed and listed above, alert, oriented, appears well hydrated and in no acute distress HEENT: atraumatic, conjunctiva  clear, no obvious abnormalities on inspection of external nose and ears NECK: no obvious masses on inspection palpation  LUNGS: clear to auscultation bilaterally, no wheezes, rales or rhonchi, good air movement CV: HRRR, no clubbing cyanosis or  peripheral edema nl cap refill  MS: moves all extremities without noticeable focal  abnormality  arthritis changes  feet  PSYCH: pleasant and cooperative, no obvious depression or anxiety Lab Results  Component Value Date   WBC 6.0 11/04/2022   HGB 13.7 11/04/2022   HCT 41.2 11/04/2022   PLT 318.0 11/04/2022   GLUCOSE 115 (H) 08/26/2022   CHOL 179 12/17/2020   TRIG 156.0 (H) 12/17/2020   HDL 63.50 12/17/2020   LDLDIRECT 114.0 04/02/2010   LDLCALC 84 12/17/2020   ALT 13 08/26/2022   AST 18  08/26/2022   NA 140 08/26/2022   K 4.4 08/26/2022   CL 104 08/26/2022   CREATININE 0.74 08/26/2022   BUN 22 08/26/2022   CO2 23 08/26/2022   TSH 0.85 12/17/2020   INR 0.96 06/16/2017   HGBA1C 5.6 11/04/2022   BP Readings from Last 3 Encounters:  11/12/22 138/70  11/10/22 (!) 149/75  09/10/22 120/66  Ct review  renal cyst and bronchocele incidental finding Lab Results  Component Value Date   FERRITIN 58 11/04/2022   Lab Results  Component Value Date   IRON 77 11/04/2022   TIBC 343 11/04/2022   FERRITIN 58 11/04/2022    ASSESSMENT AND PLAN:  Discussed the following assessment and plan:  Essential hypertension - reasonable to try off the low does amlodipine. send in readings . after off  Small fiber neuropathy  Medication management  Psoriatic arthritis (HCC)  Iron deficiency - recieveing infusions  Influenza vaccine needed - Plan: Flu Vaccine Trivalent High Dose (Fluad) Will need fu routine labs to include iron studies . Lipids also due can order for next testing . Contact us for lab orders  when due .  Disc about  regarding trying to decide  on whether and when if to do surgery for large HH  -  Patient advised to return or notify health care team  if  new concerns arise.  Patient Instructions  Ok to try off  2.5 amlodipine .   And follow BP   Send in BP reading in about 4-6 weeks.  Send message when due for  labs to order.    Plan 6 months  visit or as needed.    [name]. Jani Ploeger M.D.

## 2022-11-12 ENCOUNTER — Encounter: Payer: Self-pay | Admitting: Internal Medicine

## 2022-11-12 ENCOUNTER — Ambulatory Visit (INDEPENDENT_AMBULATORY_CARE_PROVIDER_SITE_OTHER): Payer: Medicare Other | Admitting: Internal Medicine

## 2022-11-12 VITALS — BP 138/70 | HR 69 | Temp 97.9°F | Ht 65.0 in | Wt 203.2 lb

## 2022-11-12 DIAGNOSIS — E611 Iron deficiency: Secondary | ICD-10-CM

## 2022-11-12 DIAGNOSIS — L4050 Arthropathic psoriasis, unspecified: Secondary | ICD-10-CM

## 2022-11-12 DIAGNOSIS — Z79899 Other long term (current) drug therapy: Secondary | ICD-10-CM | POA: Diagnosis not present

## 2022-11-12 DIAGNOSIS — G629 Polyneuropathy, unspecified: Secondary | ICD-10-CM | POA: Diagnosis not present

## 2022-11-12 DIAGNOSIS — I10 Essential (primary) hypertension: Secondary | ICD-10-CM | POA: Diagnosis not present

## 2022-11-12 DIAGNOSIS — Z23 Encounter for immunization: Secondary | ICD-10-CM | POA: Diagnosis not present

## 2022-11-12 MED ORDER — LOSARTAN POTASSIUM 100 MG PO TABS: 100.0000 mg | ORAL_TABLET | Freq: Every day | ORAL | 3 refills | Status: DC

## 2022-11-12 MED ORDER — LOVASTATIN 20 MG PO TABS: 20.0000 mg | ORAL_TABLET | Freq: Every evening | ORAL | 3 refills | Status: DC

## 2022-11-12 NOTE — Patient Instructions (Addendum)
Ok to try off  2.5 amlodipine .   And follow BP   Send in BP reading in about 4-6 weeks.  Send message when due for  labs to order.    Plan 6 months  visit or as needed.

## 2022-11-18 DIAGNOSIS — Z23 Encounter for immunization: Secondary | ICD-10-CM | POA: Diagnosis not present

## 2022-11-25 ENCOUNTER — Ambulatory Visit: Payer: Self-pay | Admitting: General Surgery

## 2022-11-25 DIAGNOSIS — D500 Iron deficiency anemia secondary to blood loss (chronic): Secondary | ICD-10-CM | POA: Diagnosis not present

## 2022-11-25 DIAGNOSIS — K257 Chronic gastric ulcer without hemorrhage or perforation: Secondary | ICD-10-CM | POA: Diagnosis not present

## 2022-11-25 DIAGNOSIS — K449 Diaphragmatic hernia without obstruction or gangrene: Secondary | ICD-10-CM | POA: Diagnosis not present

## 2022-11-25 NOTE — H&P (Signed)
Chief Complaint: Follow-up       History of Present Illness: Jade Cardenas is a 72 y.o. female who is seen today as an office consultation at the request of Dr. Seymour Bars for evaluation of Follow-up .   Patient is a 72 year old female who follows back up today after EGD. Patient was previously seen for large hiatal hernia.  Patient did undergo EGD recently at [facility] which did find 10 cm hiatal hernia.  There was also what appeared to be Cameron's ulcers that were seen.  Patient does have a history of iron deficiency anemia and is currently getting iron transfusions.   Patient states that she feels a early satiety.  She has minimal dysphagia.  She does have reflux.  She is currently on Protonix twice daily likely to the Cameron's ulcers.   She has had a previous cholecystectomy and hysterectomy in the past.           Review of Systems: A complete review of systems was obtained from the patient.  I have reviewed this information and discussed as appropriate with the patient.  See HPI as well for other ROS.   Review of Systems  Constitutional: Negative.   HENT: Negative.    Eyes: Negative.   Respiratory: Negative.    Cardiovascular: Negative.   Gastrointestinal:  Positive for heartburn and nausea.  Genitourinary: Negative.   Musculoskeletal: Negative.   Skin: Negative.   Neurological: Negative.   Endo/Heme/Allergies: Negative.   Psychiatric/Behavioral: Negative.          Medical History: Past Medical History      Past Medical History:  Diagnosis Date   Anemia     Arthritis     GERD (gastroesophageal reflux disease)     Hyperlipidemia     Hypertension           Problem List  There is no problem list on file for this patient.      Past Surgical History       Past Surgical History:  Procedure Laterality Date   TONSILLECTOMY   1958   CHOLECYSTECTOMY   1996   Back surgery       JOINT REPLACEMENT       LAMINOPLASTY POSTERIOR CERVICAL SPINE       REDUCTION  MAMMAPLASTY            Allergies       Allergies  Allergen Reactions   Erythromycin Hives and Swelling      All Mycins; throat swells shut and she gets "really hot"   Iodinated Contrast Media Shortness Of Breath and Swelling      CT contrast; throat swelling, difficulty breathing.  In [place] "many years ago."   Ace Inhibitors Cough   Codeine Vomiting and Nausea   Indomethacin Vomiting and Hives   Cephalexin Hives   Iodine Rash   Nitrofurantoin Other (See Comments)      dizziness        Medications Ordered Prior to Encounter        Current Outpatient Medications on File Prior to Visit  Medication Sig Dispense Refill   folic acid (FOLVITE) 1 MG tablet Take 1 mg by mouth once daily       losartan (COZAAR) 100 MG tablet Take 1 tablet by mouth once daily       lovastatin (MEVACOR) 20 MG tablet Take 1 tablet by mouth every evening       methotrexate 25 mg/mL injection INJECT 0.3ML INTO THE SKIN ONCE WEEKLY  pantoprazole (PROTONIX) 40 MG DR tablet Take 40 mg by mouth 2 (two) times daily       SIMPONI 50 mg/0.5 mL pen injector INJECT 50MG  UNDER THE SKIN ONCE MONTHLY       triamcinolone 0.5 % cream Apply 1 Application topically 3 (three) times daily       amLODIPine (NORVASC) 5 MG tablet Take 2.5 mg by mouth once daily        No current facility-administered medications on file prior to visit.        Family History       Family History  Problem Relation Age of Onset   Obesity Mother     High blood pressure (Hypertension) Mother     Diabetes Mother     Colon cancer Mother     Stroke Father     Hyperlipidemia (Elevated cholesterol) Father     Coronary Artery Disease (Blocked arteries around heart) Father     Colon cancer Father     Coronary Artery Disease (Blocked arteries around heart) Brother          Tobacco Use History  Social History       Tobacco Use  Smoking Status Never  Smokeless Tobacco Never        Social History  Social History         Socioeconomic History   Marital status: Widowed  Tobacco Use   Smoking status: Never   Smokeless tobacco: Never  Substance and Sexual Activity   Alcohol use: Never   Drug use: Never    Social Determinants of Health        Financial Resource Strain: Low Risk  (11/08/2022)    Received from [facility]    Overall Financial Resource Strain (CARDIA)     Difficulty of Paying Living Expenses: Not very hard  Food Insecurity: No Food Insecurity (11/08/2022)    [facility] from [facility]    Hunger Vital Sign     Worried About Running Out of Food in the Last Year: Never true     Ran Out of Food in the Last Year: Never true  Transportation Needs: No Transportation Needs (11/08/2022)    Received from [facility] - Transportation     Lack of Transportation (Medical): No     Lack of Transportation (Non-Medical): No  Physical Activity: Insufficiently Active (11/08/2022)    Received from [facility]    Exercise Vital Sign     Days of Exercise per Week: 7 days     Minutes of Exercise per Session: 20 min  Stress: Stress Concern Present (11/08/2022)    Received from [facility] of Occupational Health - Occupational Stress Questionnaire     Feeling of Stress : To some extent  Social Connections: Moderately Integrated (11/08/2022)    Received from [facility]    Social Connection and Isolation Panel [NHANES]     Frequency of Communication with Friends and Family: Three times a week     Frequency of Social Gatherings with Friends and Family: More than three times a week     Attends Religious Services: 1 to 4 times per year     Active Member of [employer] or Organizations: Yes     Attends Banker Meetings: 1 to 4 times per year     Marital Status: Widowed  Housing Stability: Low Risk  (10/13/2022)    Received from [facility] Stability Vital  Sign     What is your living situation today?: I have a steady place to live     Think about the place you live.  Do you have problems with any of the following? Choose all that apply:: None/None on this list        Objective:         Vitals:    11/25/22 0946  PainSc: 0-No pain    There is no height or weight on file to calculate BMI.   Physical Exam Constitutional:      Appearance: Normal appearance.  HENT:     Head: Normocephalic and atraumatic.     Mouth/Throat:     Mouth: Mucous membranes are moist.     Pharynx: Oropharynx is clear.  Eyes:     General: No scleral icterus.    Pupils: Pupils are equal, round, and reactive to light.  Cardiovascular:     Rate and Rhythm: Normal rate and regular rhythm.     Pulses: Normal pulses.     Heart sounds: No murmur heard.    No friction rub. No gallop.  Pulmonary:     Effort: Pulmonary effort is normal. No respiratory distress.     Breath sounds: Normal breath sounds. No stridor.  Abdominal:     General: Abdomen is flat.  Musculoskeletal:        General: No swelling.  Skin:    General: Skin is warm.  Neurological:     General: No focal deficit present.     Mental Status: She is alert and oriented to person, place, and time. Mental status is at baseline.  Psychiatric:        Mood and Affect: Mood normal.        Thought Content: Thought content normal.        Judgment: Judgment normal.        Assessment and Plan:  Diagnoses and all orders for this visit:   Hiatal hernia   Chronic Cameron ulcer   Iron deficiency anemia due to chronic blood loss     Nessiah Domiano is a 72 y.o. female     We will proceed to the OR for a robotic hiatal hernia repair with mesh and fundoplication All risks and benefits were discussed with the patient, to generally include infection, bleeding, damage to surrounding structures, possible pneumothorax, and recurrence. Alternatives were offered and described.  All questions were answered and the patient voiced understanding of the procedure and wishes to proceed at this point.

## 2022-11-26 ENCOUNTER — Encounter: Payer: Self-pay | Admitting: Internal Medicine

## 2022-11-26 ENCOUNTER — Encounter: Payer: Self-pay | Admitting: Hematology

## 2022-11-26 NOTE — Telephone Encounter (Signed)
It would be great if she could get her iron at [facility].  Just send a secure chat to [place] to get her scheduled if she needs iron. Lorayne Marek, RN

## 2022-11-28 ENCOUNTER — Ambulatory Visit (HOSPITAL_BASED_OUTPATIENT_CLINIC_OR_DEPARTMENT_OTHER)
Admission: RE | Admit: 2022-11-28 | Discharge: 2022-11-28 | Disposition: A | Discharge status: Home / Self Care | Payer: Medicare Other | Source: Ambulatory Visit | Attending: Obstetrics & Gynecology | Admitting: Obstetrics & Gynecology

## 2022-11-28 DIAGNOSIS — Z1231 Encounter for screening mammogram for malignant neoplasm of breast: Secondary | ICD-10-CM | POA: Insufficient documentation

## 2022-12-01 ENCOUNTER — Other Ambulatory Visit: Payer: Self-pay | Admitting: Hematology

## 2022-12-02 ENCOUNTER — Inpatient Hospital Stay: Payer: Medicare Other | Attending: Hematology

## 2022-12-02 VITALS — BP 150/79 | HR 81 | Temp 98.0°F | Resp 18 | Ht 65.0 in | Wt 205.0 lb

## 2022-12-02 DIAGNOSIS — D509 Iron deficiency anemia, unspecified: Secondary | ICD-10-CM | POA: Insufficient documentation

## 2022-12-02 DIAGNOSIS — E611 Iron deficiency: Secondary | ICD-10-CM

## 2022-12-02 MED ORDER — SODIUM CHLORIDE 0.9 % IV SOLN
510.0000 mg | Freq: Once | INTRAVENOUS | Status: AC
  Administered 2022-12-02: 510 mg via INTRAVENOUS
  Filled 2022-12-02: qty 510

## 2022-12-02 NOTE — Patient Instructions (Signed)
 Ferumoxytol Injection What is this medication? FERUMOXYTOL (FER ue MOX i tol) treats low levels of iron in your body (iron deficiency anemia). Iron is a mineral that plays an important role in making red blood cells, which carry oxygen from your lungs to the rest of your body. This medicine may be used for other purposes; ask your health care provider or pharmacist if you have questions. COMMON BRAND NAME(S): Feraheme What should I tell my care team before I take this medication? They need to know if you have any of these conditions: Anemia not caused by low iron levels High levels of iron in the blood Magnetic resonance imaging (MRI) test scheduled An unusual or allergic reaction to iron, other medications, foods, dyes, or preservatives Pregnant or trying to get pregnant Breastfeeding How should I use this medication? This medication is injected into a vein. It is given by your care team in a hospital or clinic setting. Talk to your care team the use of this medication in children. Special care may be needed. Overdosage: If you think you have taken too much of this medicine contact a poison control center or emergency room at once. NOTE: This medicine is only for you. Do not share this medicine with others. What if I miss a dose? It is important not to miss your dose. Call your care team if you are unable to keep an appointment. What may interact with this medication? Other iron products This list may not describe all possible interactions. Give your health care provider a list of all the medicines, herbs, non-prescription drugs, or dietary supplements you use. Also tell them if you smoke, drink alcohol, or use illegal drugs. Some items may interact with your medicine. What should I watch for while using this medication? Visit your care team regularly. Tell your care team if your symptoms do not start to get better or if they get worse. You may need blood work done while you are taking this  medication. You may need to follow a special diet. Talk to your care team. Foods that contain iron include: whole grains/cereals, dried fruits, beans, or peas, leafy green vegetables, and organ meats (liver, kidney). What side effects may I notice from receiving this medication? Side effects that you should report to your care team as soon as possible: Allergic reactions--skin rash, itching, hives, swelling of the face, lips, tongue, or throat Low blood pressure--dizziness, feeling faint or lightheaded, blurry vision Shortness of breath Side effects that usually do not require medical attention (report to your care team if they continue or are bothersome): Flushing Headache Joint pain Muscle pain Nausea Pain, redness, or irritation at injection site This list may not describe all possible side effects. Call your doctor for medical advice about side effects. You may report side effects to FDA at 1-800-FDA-1088. Where should I keep my medication? This medication is given in a hospital or clinic. It will not be stored at home. NOTE: This sheet is a summary. It may not cover all possible information. If you have questions about this medicine, talk to your doctor, pharmacist, or health care provider.  2024 Elsevier/Gold Standard (2022-07-24 00:00:00)

## 2022-12-02 NOTE — Progress Notes (Signed)
Pt refused pre-medications today for Feraheme infusion. Pt states that she took tylenol and Benadryl at home at 1130 today.  Pt observed for 30 minutes post iron infusion. VSS at discharge. Pt verbalizes understanding to call [facility] for further questions/concerns.

## 2022-12-09 ENCOUNTER — Inpatient Hospital Stay: Payer: Medicare Other

## 2023-01-11 NOTE — Telephone Encounter (Signed)
Sorry about your losses . Chart should be updated .

## 2023-01-13 DIAGNOSIS — L821 Other seborrheic keratosis: Secondary | ICD-10-CM | POA: Diagnosis not present

## 2023-01-13 DIAGNOSIS — L400 Psoriasis vulgaris: Secondary | ICD-10-CM | POA: Diagnosis not present

## 2023-01-13 DIAGNOSIS — L820 Inflamed seborrheic keratosis: Secondary | ICD-10-CM | POA: Diagnosis not present

## 2023-01-13 DIAGNOSIS — D2271 Melanocytic nevi of right lower limb, including hip: Secondary | ICD-10-CM | POA: Diagnosis not present

## 2023-01-13 DIAGNOSIS — L814 Other melanin hyperpigmentation: Secondary | ICD-10-CM | POA: Diagnosis not present

## 2023-01-13 DIAGNOSIS — D1801 Hemangioma of skin and subcutaneous tissue: Secondary | ICD-10-CM | POA: Diagnosis not present

## 2023-01-26 NOTE — Telephone Encounter (Signed)
Sorry  about messaging  clarity BP is in good range and yes continue  on just the losartan  . And  do  intermittent monitoring

## 2023-02-01 NOTE — Progress Notes (Signed)
Office Visit Note  Patient: Jade Cardenas             Date of Birth: 1950-11-27           MRN: [id number]             PCP: Madelin Headings, MD Referring: Madelin Headings, MD Visit Date: 02/10/2023 Occupation: @GUAROCC @  Subjective:  Follow-up (Patient states she is achey all over but she believes it could be the weather. Patient states her shoulders, right ankle, and left knee are bothering her. )   History of Present Illness: Jade Cardenas is a 72 y.o. female with psoriatic arthritis, psoriasis, osteoarthritis, degenerative disc disease, and fibromyalgia.  She states she has been experiencing some discomfort in her shoulders, left knee and her right foot.  She states the foot is bothered by neuropathy.  She had no interruption in her treatment.  She has been on Simponi Aria and infusions every 28 days and methotrexate 0.3 mL subcu every 7 days along with folic acid.  She was recently had endoscopy which showed worsening of the hiatal hernia.  She is planning to have hiatal hernia repair next year.  She has been taking medications for reflux.  She also gets iron infusion due to iron deficiency anemia.  She continues to have some stiffness in her neck and lower back.  Fibromyalgia also flares with the colder weather.    Activities of Daily Living:  Patient reports morning stiffness for 2-3 hours.   Patient Reports nocturnal pain.  Difficulty dressing/grooming: Denies Difficulty climbing stairs: Reports Difficulty getting out of chair: Denies Difficulty using hands for taps, buttons, cutlery, and/or writing: Reports  Review of Systems  Constitutional:  Positive for fatigue.  HENT:  Positive for mouth dryness. Negative for mouth sores.   Eyes:  Positive for dryness.  Respiratory:  Negative for shortness of breath.   Cardiovascular:  Negative for chest pain and palpitations.  Gastrointestinal:  Negative for blood in stool, constipation and diarrhea.  Endocrine: Negative for  increased urination.  Genitourinary:  Negative for involuntary urination.  Musculoskeletal:  Positive for joint pain, gait problem, joint pain, joint swelling, myalgias, muscle weakness, morning stiffness, muscle tenderness and myalgias.  Skin:  Positive for sensitivity to sunlight. Negative for color change, rash and hair loss.  Allergic/Immunologic: Negative for susceptible to infections.  Neurological:  Positive for headaches. Negative for dizziness.  Hematological:  Negative for swollen glands.  Psychiatric/Behavioral:  Positive for sleep disturbance. Negative for depressed mood. The patient is not nervous/anxious.     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   Fibromyalgia 05/17/2016   DJD (degenerative joint disease), cervical 02/18/2016   Heel spur 09/29/2015   Ingrown toenail 09/29/2015   Foraminal stenosis of cervical region 07/30/2015   Microcytic hypochromic anemia 05/22/2015   Tailor's bunion of left foot 01/18/2015   H/O hiatal hernia    Bilateral primary osteoarthritis of knee 12/15/2013   Iron deficiency 12/15/2013   Psoriatic arthritis (HCC) 12/15/2013   History of foot fracture 12/15/2013   Dermatitis 10/17/2013   Hx of diethylstilbestrol (DES) exposure  in utero unknown 12/09/2012   Tinnitus 06/23/2012   Scalp lesion 01/21/2012   Headache 01/21/2012   Hx of psoriasis 01/21/2012   Weight gain 01/21/2012   Ankylosing spondylitis  possible 09/03/2011   Hx of transfusion 09/03/2011   S/P lumbar fusion 6 13 09/03/2011   Lumbar stenosis with neurogenic  claudication 08/05/2011   Allergic rhinitis 02/19/2010   GERD 02/19/2010   SPINAL STENOSIS, CERVICAL 02/19/2010   Disorder of bone and cartilage 02/19/2010   IBS 04/28/2007   History of gastric ulcer 02/09/2007   History of colonic polyps 02/09/2007   CHOLECYSTECTOMY, HX OF 02/09/2007   Hyperlipidemia 12/29/2006   Essential hypertension 12/29/2006    Past Medical History:  Diagnosis Date   Anemia    iron def   Arthritis    rheumatoid, psoriatic, osteoarthritis    Bone spur    RIGHT FOOT    Broken toe    Bruises easily    Cellulitis    Chronic back pain    COLONIC POLYPS, HX OF 02/09/2007   Cough, persistent 01/21/2012   poss from acei    DES exposure in utero, unknown    Femur fracture, left (HCC)    Fibromyalgia    doesn't require meds   GASTRIC ULCER, ACUTE, HEMORRHAGE, HX OF 02/09/2007   GERD (gastroesophageal reflux disease)    takes Nexium daily   Goiter    H/O hiatal hernia    H/O: hysterectomy    when 72 yrs old   Hx of seasonal allergies    takes Claritin daily   HYPERLIPIDEMIA 12/29/2006   takes Lovastatin nightly   HYPERTENSION 12/29/2006   takes Enalapril daily   IBS 04/28/2007   Insomnia    related to pain;takes Flexeril and Tylenol PM nightly   Joint pain    Joint swelling    Neuropathy    small cell   Osteopenia    Palpitations 02/09/2007   PONV (postoperative nausea and vomiting)    NAUSEA   REDUCTION MAMMOPLASTY, HX OF 02/09/2007   Right bundle branch block 02/09/2007   S/P lumbar fusion 6 13 09/03/2011   L4 L5  posterior    SYNCOPE 12/07/2007   Tendonitis    RLE    UNS ADVRS EFF OTH RX MEDICINAL\T\BIOLOGICAL SBSTNC 02/09/2007    Family History  Problem Relation Age of Onset   Diabetes Mother    Colon cancer Mother        x2   Arthritis Mother    Hypertension Mother    Endometrial cancer Mother    Stroke Father    Heart disease Father    Colon cancer Father    Arthritis Brother    Hypertension Brother    Hyperlipidemia  Brother    Infertility Brother    Diabetes Brother    Other Other        DISH brother   Hypertension Maternal Grandmother    Stroke Maternal Grandmother    Anesthesia problems Neg Hx    Hypotension Neg Hx    Malignant hyperthermia Neg Hx    Pseudochol deficiency Neg Hx    Past Surgical History:  Procedure Laterality Date   BACK SURGERY  12+yrs ago   Synovial Cyst removal   BACK SURGERY  6/13   lumbar L4-L5 fusion, lamenectomy   bone spur  6-37yrs ago   right ankle   BUNIONECTOMY  10/03/14   right foot   CERVICAL FUSION     2 12    CHOLECYSTECTOMY  1996  HARDWARE REMOVAL Left 08/01/2018   Procedure: Left knee hardware removal;  Surgeon: Ollen Gross, MD;  Location: [facility];  Service: Orthopedics;  Laterality: Left;    JOINT REPLACEMENT     KNEE ARTHROPLASTY     POSTERIOR CERVICAL LAMINECTOMY Left 07/30/2015   Procedure: Laminectomy and Foraminotomy - left - Cervical two -Cervical three;  Surgeon: Julio Sicks, MD;  Location: [facility];  Service: Neurosurgery;  Laterality: Left;   REDUCTION MAMMAPLASTY Bilateral 1989   TONSILLECTOMY     as a child   TOTAL ABDOMINAL HYSTERECTOMY W/ BILATERAL SALPINGOOPHORECTOMY  1986   TOTAL KNEE ARTHROPLASTY N/A 06/21/2017   Procedure: LEFT TOTAL KNEE ARTHROPLASTY AND RIGHT KNEE CORTISONE INJECTION;  Surgeon: Ollen Gross, MD;  Location: [facility];  Service: Orthopedics;  Laterality: N/A;   Social History   Social History Narrative   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  Husband died suddenly 2010Non smoker         Left handed    Lives in a one story home          Immunization History  Administered Date(s) Administered   Fluad Quad(high Dose 65+) 11/04/2018, 11/07/2019, 11/21/2020, 11/11/2021   Fluad Trivalent(High Dose 65+) 11/12/2022   Influenza Split 11/29/2012, 11/27/2013   Influenza Whole 11/09/2008   Influenza, High Dose Seasonal PF 11/26/2015, 11/30/2016, 11/03/2017,  12/05/2020   Influenza,inj,Quad PF,6+ Mos 11/27/2014   Influenza-Unspecified 12/22/2016   Moderna Covid-19 Fall Seasonal Vaccine 29yrs & older 11/18/2022   PFIZER Comirnaty(Gray Top)Covid-19 Tri-Sucrose Vaccine 03/28/2020, 11/24/2021   PFIZER(Purple Top)SARS-COV-2 Vaccination 04/08/2019, 05/03/2019, 10/19/2019, 03/28/2020   PPD Test 12/18/2013, 11/27/2014, 11/26/2015, 11/30/2016   Pfizer Covid-19 Vaccine Bivalent Booster 62yrs & up 11/21/2020   Pneumococcal Conjugate-13 02/28/2013   Pneumococcal Polysaccharide-23 03/03/2003, 02/28/2014, 11/07/2019   Respiratory Syncytial Virus Vaccine,Recomb Aduvanted(Arexvy) 12/12/2021   Td 05/09/2008   Tdap 07/19/2018   Zoster Recombinant(Shingrix) 12/03/2016, 02/04/2017   Zoster, Live 01/20/2011     Objective: Vital Signs: BP (!) 145/85 (BP Location: Left Arm, Patient Position: Sitting, Cuff Size: Normal)   Pulse 79   Resp 14   Ht 5\' 5"  (1.651 m)   Wt 209 lb (94.8 kg)   LMP 03/02/1984   BMI 34.78 kg/m    Physical Exam Vitals and nursing note reviewed.  Constitutional:      Appearance: She is well-developed.  HENT:     Head: Normocephalic and atraumatic.  Eyes:     Conjunctiva/sclera: Conjunctivae normal.  Cardiovascular:     Rate and Rhythm: Normal rate and regular rhythm.     Heart sounds: Normal heart sounds.  Pulmonary:     Effort: Pulmonary effort is normal.     Breath sounds: Normal breath sounds.  Abdominal:     General: Bowel sounds are normal.     Palpations: Abdomen is soft.  Musculoskeletal:     Cervical back: Normal range of motion.  Lymphadenopathy:     Cervical: No cervical adenopathy.  Skin:    General: Skin is warm and dry.     Capillary Refill: Capillary refill takes less than 2 seconds.  Neurological:     Mental Status: She is alert and oriented to person, place, and time.  Psychiatric:        Behavior: Behavior normal.      Musculoskeletal Exam: Cervical spine was in good range of motion.  Thoracic  kyphosis was noted.  She had some discomfort in the lumbar region.  Shoulders, elbows,  wrist joints, MCPs PIPs and DIPs with good range of motion.  Bilateral PIP and DIP thickening with no synovitis was noted.  Hip joints were in good range of motion.  Knee joints were in good range of motion.  Left knee joint was replaced.  There was no tenderness over ankles or MTPs.  CDAI Exam: CDAI Score: -- Patient Global: --; Provider Global: -- Swollen: --; Tender: -- Joint Exam 02/10/2023   No joint exam has been documented for this visit   There is currently no information documented on the homunculus. Go to the Rheumatology activity and complete the homunculus joint exam.  Investigation: No additional findings.  Imaging: No results found.  Recent Labs: Lab Results  Component Value Date   WBC 5.2 02/04/2023   HGB 12.9 02/04/2023   PLT 325 02/04/2023   NA 140 02/04/2023   K 4.7 02/04/2023   CL 104 02/04/2023   CO2 28 02/04/2023   GLUCOSE 92 02/04/2023   BUN 20 02/04/2023   CREATININE 0.76 02/04/2023   BILITOT 0.8 02/04/2023   ALKPHOS 82 05/08/2022   AST 16 02/04/2023   ALT 14 02/04/2023   PROT 7.0 02/04/2023   ALBUMIN 4.3 05/08/2022   CALCIUM 9.3 02/04/2023   GFRAA 87 02/02/2020   QFTBGOLDPLUS NEGATIVE 02/04/2023    Speciality Comments: Failed therapy: Arava and Otezla due to diarrhea Enbrel, Humira-neuropathy  Procedures:  No procedures performed Allergies: Erythromycin, Iodinated contrast media, Ace inhibitors, Indomethacin, Keflex [cephalexin], and Macrobid [nitrofurantoin]   Assessment / Plan:     Visit Diagnoses: Psoriatic arthritis (HCC)-patient had no synovitis on the examination.  She has been experiencing increased discomfort with the weather change her most likely due to underlying osteoarthritis.  Hx of psoriasis-there was no active psoriasis lesions.  She denies any recent psoriasis flare.  She uses steroid cream for dry skin.  High risk medication use -  Simponi 50 mg every 28 days and methotrexate 0.3 ml every 7 days folic acid 1 mg p.o. daily..([place] caused diarrhea).  Labs obtained on February 04, 2023 CBC and CMP were normal.  TB Gold was negative.  Patient was advised to get labs every 3 months to monitor for drug toxicity.  Information on immunization was placed in the AVS.  She was advised to hold Simponi and methotrexate if she develops an infection and resume after the infection resolves.  Annual skin examination to screen for skin cancer was advised.  Use of sunscreen and sun protection was advised.  Primary osteoarthritis of both hands-she had bilateral PIP and DIP thickening consistent with osteoarthritis.  Joint protection was advised.  Primary osteoarthritis of right knee-she had good range of motion without discomfort.  Total knee replacement status, left - by Dr. Lequita Halt.  She recently has been experiencing some discomfort.  No warmth swelling or effusion was noted.  Primary osteoarthritis of both feet - she has bilateral hammertoes.  Proper fitting shoes were advised.  DDD (degenerative disc disease), cervical - S/P fusion x2: She has limited lateral rotation without discomfort.  Although she has been having some discomfort in her cervical spine with the weather change.  Spondylosis of lumbar spine - S/P fusion.  She continues to have some lower back pain.  She had a recent MRI of the lumbar spine on 02/26/2021.  Small fiber neuropathy -she continues to have paresthesia of feet.  She discontinued Cymbalta and tried Lyrica but could not tolerate the side effects.  Fibromyalgia-she has generalized pain and hyperalgesia from fibromyalgia.  Chronic fatigue-related to fibromyalgia.  Osteopenia of multiple sites - Followed by her GYN.  Updated DXA on 11/14/20: The BMD measured at DualFemur Neck Right is 0.829 g/cm2 with a T-score of -1.5.  Calcium rich diet and vitamin D was advised.  Hiatal hernia-patient reports worsening  of her hiatal hernia.  She will require elective surgery next year.  I advised her to stop Simponi 1 month prior to the surgery and restart 2 weeks after the surgery if she has clearance by the surgeon.  She will also stop methotrexate 1 week prior to surgery and may resume 2 weeks after the surgery if she is clearance by the surgeon.  Other iron deficiency anemia - patient had iron infusion by hematology.  Orders: No orders of the defined types were placed in this encounter.  No orders of the defined types were placed in this encounter.    Follow-Up Instructions: Return in about 5 months (around 07/11/2023) for Psoriatic arthritis.   Pollyann Savoy, MD  Note - This record has been created using Animal nutritionist.  Chart creation errors have been sought, but may not always  have been located. Such creation errors do not reflect on  the standard of medical care.

## 2023-02-01 NOTE — Telephone Encounter (Signed)
Please compose  jury excuse note  for medical reasons.  Medical reason are   chronic conditions that limits her mobility and recent surgery.

## 2023-02-02 NOTE — Telephone Encounter (Signed)
Contacted pt to get juror number and date of jury duty.   Letter is composed and sent to [facility] as pt requested.

## 2023-02-04 ENCOUNTER — Other Ambulatory Visit: Payer: Self-pay | Admitting: *Deleted

## 2023-02-04 DIAGNOSIS — Z111 Encounter for screening for respiratory tuberculosis: Secondary | ICD-10-CM

## 2023-02-04 DIAGNOSIS — D509 Iron deficiency anemia, unspecified: Secondary | ICD-10-CM | POA: Diagnosis not present

## 2023-02-04 DIAGNOSIS — Z79899 Other long term (current) drug therapy: Secondary | ICD-10-CM

## 2023-02-04 DIAGNOSIS — Z9225 Personal history of immunosupression therapy: Secondary | ICD-10-CM | POA: Diagnosis not present

## 2023-02-05 NOTE — Progress Notes (Signed)
CBC, CMP and ferritin are normal.  Please forward results to her PCP.

## 2023-02-06 LAB — CBC WITH DIFFERENTIAL/PLATELET
Absolute Lymphocytes: 1700 {cells}/uL (ref 850–3900)
Absolute Monocytes: 354 {cells}/uL (ref 200–950)
Basophils Absolute: 62 {cells}/uL (ref 0–200)
Basophils Relative: 1.2 %
Eosinophils Absolute: 73 {cells}/uL (ref 15–500)
Eosinophils Relative: 1.4 %
HCT: 37.5 % (ref 35.0–45.0)
Hemoglobin: 12.9 g/dL (ref 11.7–15.5)
MCH: 34.4 pg — ABNORMAL HIGH (ref 27.0–33.0)
MCHC: 34.4 g/dL (ref 32.0–36.0)
MCV: 100 fL (ref 80.0–100.0)
MPV: 10.2 fL (ref 7.5–12.5)
Monocytes Relative: 6.8 %
Neutro Abs: 3011 {cells}/uL (ref 1500–7800)
Neutrophils Relative %: 57.9 %
Platelets: 325 10*3/uL (ref 140–400)
RBC: 3.75 10*6/uL — ABNORMAL LOW (ref 3.80–5.10)
RDW: 13.1 % (ref 11.0–15.0)
Total Lymphocyte: 32.7 %
WBC: 5.2 10*3/uL (ref 3.8–10.8)

## 2023-02-06 LAB — COMPLETE METABOLIC PANEL WITH GFR
AG Ratio: 1.7 (calc) (ref 1.0–2.5)
ALT: 14 U/L (ref 6–29)
AST: 16 U/L (ref 10–35)
Albumin: 4.4 g/dL (ref 3.6–5.1)
Alkaline phosphatase (APISO): 82 U/L (ref 37–153)
BUN: 20 mg/dL (ref 7–25)
CO2: 28 mmol/L (ref 20–32)
Calcium: 9.3 mg/dL (ref 8.6–10.4)
Chloride: 104 mmol/L (ref 98–110)
Creat: 0.76 mg/dL (ref 0.60–1.00)
Globulin: 2.6 g/dL (ref 1.9–3.7)
Glucose, Bld: 92 mg/dL (ref 65–99)
Potassium: 4.7 mmol/L (ref 3.5–5.3)
Sodium: 140 mmol/L (ref 135–146)
Total Bilirubin: 0.8 mg/dL (ref 0.2–1.2)
Total Protein: 7 g/dL (ref 6.1–8.1)
eGFR: 84 mL/min/{1.73_m2} (ref >=60)

## 2023-02-06 LAB — QUANTIFERON-TB GOLD PLUS
Mitogen-NIL: 3.56 [IU]/mL
NIL: 0.04 [IU]/mL
QuantiFERON-TB Gold Plus: NEGATIVE
TB1-NIL: <0 [IU]/mL
TB2-NIL: <0 [IU]/mL

## 2023-02-06 LAB — FERRITIN: Ferritin: 78 ng/mL (ref 16–288)

## 2023-02-07 NOTE — Progress Notes (Signed)
TB Gold is negative.

## 2023-02-10 ENCOUNTER — Ambulatory Visit: Payer: Medicare Other | Attending: Rheumatology | Admitting: Rheumatology

## 2023-02-10 ENCOUNTER — Encounter: Payer: Self-pay | Admitting: Rheumatology

## 2023-02-10 VITALS — BP 145/85 | HR 79 | Resp 14 | Ht 65.0 in | Wt 209.0 lb

## 2023-02-10 DIAGNOSIS — M19041 Primary osteoarthritis, right hand: Secondary | ICD-10-CM | POA: Insufficient documentation

## 2023-02-10 DIAGNOSIS — M19072 Primary osteoarthritis, left ankle and foot: Secondary | ICD-10-CM | POA: Insufficient documentation

## 2023-02-10 DIAGNOSIS — M47816 Spondylosis without myelopathy or radiculopathy, lumbar region: Secondary | ICD-10-CM | POA: Insufficient documentation

## 2023-02-10 DIAGNOSIS — M5030 Other cervical disc degeneration, unspecified cervical region: Secondary | ICD-10-CM | POA: Insufficient documentation

## 2023-02-10 DIAGNOSIS — K449 Diaphragmatic hernia without obstruction or gangrene: Secondary | ICD-10-CM | POA: Insufficient documentation

## 2023-02-10 DIAGNOSIS — M797 Fibromyalgia: Secondary | ICD-10-CM | POA: Diagnosis not present

## 2023-02-10 DIAGNOSIS — Z96652 Presence of left artificial knee joint: Secondary | ICD-10-CM | POA: Diagnosis not present

## 2023-02-10 DIAGNOSIS — M19071 Primary osteoarthritis, right ankle and foot: Secondary | ICD-10-CM | POA: Insufficient documentation

## 2023-02-10 DIAGNOSIS — R5382 Chronic fatigue, unspecified: Secondary | ICD-10-CM | POA: Insufficient documentation

## 2023-02-10 DIAGNOSIS — L4050 Arthropathic psoriasis, unspecified: Secondary | ICD-10-CM | POA: Diagnosis not present

## 2023-02-10 DIAGNOSIS — Z872 Personal history of diseases of the skin and subcutaneous tissue: Secondary | ICD-10-CM | POA: Diagnosis not present

## 2023-02-10 DIAGNOSIS — D508 Other iron deficiency anemias: Secondary | ICD-10-CM | POA: Insufficient documentation

## 2023-02-10 DIAGNOSIS — Z79899 Other long term (current) drug therapy: Secondary | ICD-10-CM | POA: Insufficient documentation

## 2023-02-10 DIAGNOSIS — G629 Polyneuropathy, unspecified: Secondary | ICD-10-CM | POA: Diagnosis not present

## 2023-02-10 DIAGNOSIS — M8589 Other specified disorders of bone density and structure, multiple sites: Secondary | ICD-10-CM | POA: Insufficient documentation

## 2023-02-10 DIAGNOSIS — M1711 Unilateral primary osteoarthritis, right knee: Secondary | ICD-10-CM | POA: Insufficient documentation

## 2023-02-10 DIAGNOSIS — M19042 Primary osteoarthritis, left hand: Secondary | ICD-10-CM | POA: Insufficient documentation

## 2023-02-10 NOTE — Patient Instructions (Signed)
Vaccines You are taking a medication(s) that can suppress your immune system.  The following immunizations are recommended: Flu annually Covid-19  RSV Td/Tdap (tetanus, diphtheria, pertussis) every 10 years Pneumonia (Prevnar 15 then Pneumovax 23 at least 1 year apart.  Alternatively, can take Prevnar 20 without needing additional dose) Shingrix: 2 doses from 4 weeks to 6 months apart  Please check with your PCP to make sure you are up to date.   If you have signs or symptoms of an infection or start antibiotics: First, call your PCP for workup of your infection. Hold your medication through the infection, until you complete your antibiotics, and until symptoms resolve if you take the following: Injectable medication (Actemra, Benlysta, Cimzia, Cosentyx, Enbrel, Humira, Kevzara, Orencia, Remicade, Simponi, Stelara, Taltz, Tremfya) Methotrexate Leflunomide (Arava) Mycophenolate (Cellcept) Harriette Ohara, Olumiant, or Rinvoq  He is getting annual skin examination to screen for skin cancer while you are on Simponi.  Please use sunscreen and sun protection.

## 2023-02-27 ENCOUNTER — Other Ambulatory Visit: Payer: Self-pay | Admitting: Rheumatology

## 2023-03-01 NOTE — Telephone Encounter (Signed)
Last Fill: 03/16/2022  Labs: 02/04/2023 CBC, CMP and ferritin are normal.   Next Visit: 07/21/2023  Last Visit: 02/10/2023  DX: Psoriatic arthritis   Current Dose per office note 02/10/2023: methotrexate 0.3 ml every 7 days   Okay to refill Methotrexate?

## 2023-04-13 ENCOUNTER — Encounter: Payer: Self-pay | Admitting: Internal Medicine

## 2023-04-13 DIAGNOSIS — E611 Iron deficiency: Secondary | ICD-10-CM

## 2023-04-13 DIAGNOSIS — Z79899 Other long term (current) drug therapy: Secondary | ICD-10-CM

## 2023-04-13 DIAGNOSIS — R739 Hyperglycemia, unspecified: Secondary | ICD-10-CM

## 2023-04-13 DIAGNOSIS — E7849 Other hyperlipidemia: Secondary | ICD-10-CM

## 2023-04-13 DIAGNOSIS — G629 Polyneuropathy, unspecified: Secondary | ICD-10-CM

## 2023-04-13 DIAGNOSIS — I10 Essential (primary) hypertension: Secondary | ICD-10-CM

## 2023-04-13 DIAGNOSIS — L4050 Arthropathic psoriasis, unspecified: Secondary | ICD-10-CM

## 2023-04-15 ENCOUNTER — Other Ambulatory Visit: Payer: Self-pay | Admitting: Physician Assistant

## 2023-04-19 ENCOUNTER — Ambulatory Visit (INDEPENDENT_AMBULATORY_CARE_PROVIDER_SITE_OTHER): Payer: Medicare Other | Admitting: Podiatry

## 2023-04-19 ENCOUNTER — Ambulatory Visit (INDEPENDENT_AMBULATORY_CARE_PROVIDER_SITE_OTHER): Payer: Medicare Other

## 2023-04-19 DIAGNOSIS — M79671 Pain in right foot: Secondary | ICD-10-CM

## 2023-04-19 DIAGNOSIS — M19071 Primary osteoarthritis, right ankle and foot: Secondary | ICD-10-CM | POA: Diagnosis not present

## 2023-04-19 DIAGNOSIS — M722 Plantar fascial fibromatosis: Secondary | ICD-10-CM | POA: Diagnosis not present

## 2023-04-19 NOTE — Progress Notes (Unsigned)
 Check orthotics

## 2023-04-19 NOTE — Patient Instructions (Signed)
 You can use UREA NAIL GEL on the toenails

## 2023-04-19 NOTE — Telephone Encounter (Signed)
 Will place future orders   Will order cmp and cbcdiff  for  Dr D  Include iron studies for Dr Candise Che  Lipid  tsh  A1c fu also  (Last b12 was fine)

## 2023-04-21 ENCOUNTER — Telehealth: Payer: Self-pay

## 2023-04-21 NOTE — Telephone Encounter (Signed)
 Left pt VM so we can speak about cost of orthotics and if her insurance covers them.

## 2023-04-21 NOTE — Progress Notes (Signed)
 Subjective: Chief Complaint  Patient presents with   Foot Pain    RM#12 Foot pain   73 year old female presents the office today with the above concerns.  She states the injection last appointment only helped for a couple of days.  Most of her pain she points to the first MTPJ.  No recent injury or changes otherwise.  Objective: AAO x3, NAD DP/PT pulses palpable bilaterally, CRT less than 3 seconds There is mild tenderness palpation on the right first MTPJ and there is decreasing to motion of the joint.  No crepitus with range of motion.  There is no other areas of pinpoint tenderness in the right foot.  Still with some discomfort on the plantar fascia on the left side.  There is no area pinpoint tenderness otherwise. No pain with calf compression, swelling, warmth, erythema  Assessment: Hallux limitus, plantar fasciitis  Plan: -All treatment options discussed with the patient including all alternatives, risks, complications.  -X-rays obtained reviewed bilaterally.  No subacute fracture.  Calcification along the left inferior heel.  Digital contractures are present.  Arthritic changes present the first MTPJ on the right foot.  Hardware intact from prior surgery. -This.  If have not lasted we discussed other treatment options including custom orthotics.  I do see that inserts were beneficial and consider Morton's extension on the right first MPJ.  Continue shoes, good support.  Home stretching, rehab exercises daily. -Patient encouraged to call the office with any questions, concerns, change in symptoms.   Vivi Barrack DPM

## 2023-04-26 ENCOUNTER — Ambulatory Visit: Payer: Medicare Other | Admitting: Podiatry

## 2023-04-30 ENCOUNTER — Other Ambulatory Visit: Payer: Self-pay | Admitting: Rheumatology

## 2023-04-30 NOTE — Telephone Encounter (Signed)
 Last Fill: 10/21/2022  Labs: 02/04/2023 CBC, CMP and ferritin are normal.   TB Gold: 02/04/2023 Neg    Next Visit: 07/21/2023  Last Visit: 02/10/2023  [id number] arthritis   Current Dose per office note 02/10/2023: Simponi 50 mg every 28 days   Okay to refill Simponi?

## 2023-05-03 ENCOUNTER — Other Ambulatory Visit (INDEPENDENT_AMBULATORY_CARE_PROVIDER_SITE_OTHER): Payer: Medicare Other

## 2023-05-03 DIAGNOSIS — I10 Essential (primary) hypertension: Secondary | ICD-10-CM | POA: Diagnosis not present

## 2023-05-03 DIAGNOSIS — Z79899 Other long term (current) drug therapy: Secondary | ICD-10-CM

## 2023-05-03 DIAGNOSIS — E7849 Other hyperlipidemia: Secondary | ICD-10-CM | POA: Diagnosis not present

## 2023-05-03 DIAGNOSIS — R739 Hyperglycemia, unspecified: Secondary | ICD-10-CM | POA: Diagnosis not present

## 2023-05-03 DIAGNOSIS — G629 Polyneuropathy, unspecified: Secondary | ICD-10-CM

## 2023-05-03 DIAGNOSIS — E611 Iron deficiency: Secondary | ICD-10-CM | POA: Diagnosis not present

## 2023-05-03 DIAGNOSIS — L4050 Arthropathic psoriasis, unspecified: Secondary | ICD-10-CM

## 2023-05-03 LAB — LIPID PANEL
Cholesterol: 181 mg/dL (ref 0–200)
HDL: 70.6 mg/dL (ref >39.00)
LDL Cholesterol: 82 mg/dL (ref 0–99)
NonHDL: 110.32
Total CHOL/HDL Ratio: 3
Triglycerides: 144 mg/dL (ref 0.0–149.0)
VLDL: 28.8 mg/dL (ref 0.0–40.0)

## 2023-05-03 LAB — COMPREHENSIVE METABOLIC PANEL
ALT: 18 U/L (ref 0–35)
AST: 19 U/L (ref 0–37)
Albumin: 4.5 g/dL (ref 3.5–5.2)
Alkaline Phosphatase: 72 U/L (ref 39–117)
BUN: 19 mg/dL (ref 6–23)
CO2: 29 meq/L (ref 19–32)
Calcium: 10.1 mg/dL (ref 8.4–10.5)
Chloride: 101 meq/L (ref 96–112)
Creatinine, Ser: 0.8 mg/dL (ref 0.40–1.20)
GFR: 74.38 mL/min (ref >60.00)
Glucose, Bld: 98 mg/dL (ref 70–99)
Potassium: 4.2 meq/L (ref 3.5–5.1)
Sodium: 140 meq/L (ref 135–145)
Total Bilirubin: 1.1 mg/dL (ref 0.2–1.2)
Total Protein: 7.8 g/dL (ref 6.0–8.3)

## 2023-05-03 LAB — CBC WITH DIFFERENTIAL/PLATELET
Basophils Absolute: 0.1 10*3/uL (ref 0.0–0.1)
Basophils Relative: 1.1 % (ref 0.0–3.0)
Eosinophils Absolute: 0.1 10*3/uL (ref 0.0–0.7)
Eosinophils Relative: 1.3 % (ref 0.0–5.0)
HCT: 41.8 % (ref 36.0–46.0)
Hemoglobin: 14.3 g/dL (ref 12.0–15.0)
Lymphocytes Relative: 33.2 % (ref 12.0–46.0)
Lymphs Abs: 1.8 10*3/uL (ref 0.7–4.0)
MCHC: 34.1 g/dL (ref 30.0–36.0)
MCV: 96.8 fl (ref 78.0–100.0)
Monocytes Absolute: 0.3 10*3/uL (ref 0.1–1.0)
Monocytes Relative: 5.3 % (ref 3.0–12.0)
Neutro Abs: 3.2 10*3/uL (ref 1.4–7.7)
Neutrophils Relative %: 59.1 % (ref 43.0–77.0)
Platelets: 331 10*3/uL (ref 150.0–400.0)
RBC: 4.32 Mil/uL (ref 3.87–5.11)
RDW: 13.4 % (ref 11.5–15.5)
WBC: 5.4 10*3/uL (ref 4.0–10.5)

## 2023-05-03 LAB — HEMOGLOBIN A1C: Hgb A1c MFr Bld: 5.8 % (ref 4.6–6.5)

## 2023-05-03 LAB — TSH: TSH: 1.28 u[IU]/mL (ref 0.35–5.50)

## 2023-05-04 ENCOUNTER — Encounter: Payer: Self-pay | Admitting: Internal Medicine

## 2023-05-04 LAB — IRON,TIBC AND FERRITIN PANEL
%SAT: 37 % (ref 16–45)
Ferritin: 43 ng/mL (ref 16–288)
Iron: 131 ug/dL (ref 45–160)
TIBC: 358 ug/dL (ref 250–450)

## 2023-05-04 NOTE — Progress Notes (Signed)
 Results  in  range  can review at upcoming  appt.  Forwarding to  care team as Jade Cardenas

## 2023-05-11 NOTE — Progress Notes (Unsigned)
 No chief complaint on file.   HPI: Jade Cardenas 73 y.o. come in for Chronic disease management  ROS: See pertinent positives and negatives per HPI.  Past Medical History:  Diagnosis Date   Anemia    iron def   Arthritis    rheumatoid, psoriatic, osteoarthritis    Bone spur    RIGHT FOOT    Broken toe    Bruises easily    Cellulitis    Chronic back pain    COLONIC POLYPS, HX OF 02/09/2007   Cough, persistent 01/21/2012   poss from acei    DES exposure in utero, unknown    Femur fracture, left (HCC)    Fibromyalgia    doesn't require meds   GASTRIC ULCER, ACUTE, HEMORRHAGE, HX OF 02/09/2007   GERD (gastroesophageal reflux disease)    takes Nexium daily   Goiter    H/O hiatal hernia    H/O: hysterectomy    when 73 yrs old   Hx of seasonal allergies    takes Claritin daily   HYPERLIPIDEMIA 12/29/2006   takes Lovastatin nightly   HYPERTENSION 12/29/2006   takes Enalapril daily   IBS 04/28/2007   Insomnia    related to pain;takes Flexeril and Tylenol PM nightly   Joint pain    Joint swelling    Neuropathy    small cell   Osteopenia    Palpitations 02/09/2007   PONV (postoperative nausea and vomiting)    NAUSEA   REDUCTION MAMMOPLASTY, HX OF 02/09/2007   Right bundle branch block 02/09/2007   S/P lumbar fusion 6 13 09/03/2011   L4 L5  posterior    SYNCOPE 12/07/2007   Tendonitis    RLE    UNS ADVRS EFF OTH RX MEDICINAL\T\BIOLOGICAL SBSTNC 02/09/2007    Family History  Problem Relation Age of Onset   Diabetes Mother    Colon cancer Mother        x2   Arthritis Mother    Hypertension Mother    Endometrial cancer Mother    Stroke Father    Heart disease Father    Colon cancer Father    Arthritis Brother    Hypertension Brother    Hyperlipidemia Brother    Infertility Brother    Diabetes Brother    Other Other        DISH brother   Hypertension Maternal Grandmother    Stroke Maternal Grandmother    Anesthesia problems Neg Hx     Hypotension Neg Hx    Malignant hyperthermia Neg Hx    Pseudochol deficiency Neg Hx     Social History   Socioeconomic History   Marital status: Widowed    Spouse name: Not on file   Number of children: 0   Years of education: Not on file   Highest education level: Master's degree (e.g., MA, MS, MEng, MEd, MSW, MBA)  Occupational History   Not on file  Tobacco Use   Smoking status: Never    Passive exposure: Never   Smokeless tobacco: Never  Vaping Use   Vaping status: Never Used  Substance and Sexual Activity   Alcohol use: No    Alcohol/week: 0.0 standard drinks of alcohol   Drug use: No   Sexual activity: Not Currently    Partners: Male    Birth control/protection: Surgical    Comment: TAH/BSO  Other Topics Concern   Not on file  Social History Narrative   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 on retirement trackWidow  Husband died suddenly 2010Non smoker         Left handed    Lives in a one story home          Social Drivers of Health   Financial Resource Strain: Medium Risk (05/08/2023)   Overall Financial Resource Strain (CARDIA)    Difficulty of Paying Living Expenses: Somewhat hard  Food Insecurity: No Food Insecurity (05/08/2023)   Hunger Vital Sign    Worried About Running Out of Food in the Last Year: Never true    Ran Out of Food in the Last Year: Never true  Transportation Needs: No Transportation Needs (05/08/2023)   PRAPARE - Administrator, Civil Service (Medical): No    Lack of Transportation (Non-Medical): No  Physical Activity: Insufficiently Active (05/08/2023)   Exercise Vital Sign    Days of Exercise per Week: 7 days    Minutes of Exercise per Session: 20 min  Stress: Stress Concern Present (05/08/2023)   [employer] of Occupational Health - Occupational Stress Questionnaire    Feeling of Stress : To some extent  Social Connections: Moderately Integrated (05/08/2023)   Social Connection and  Isolation Panel [NHANES]    Frequency of Communication with Friends and Family: More than three times a week    Frequency of Social Gatherings with Friends and Family: More than three times a week    Attends Religious Services: 1 to 4 times per year    Active Member of [employer] or Organizations: Yes    Attends Banker Meetings: More than 4 times per year    Marital Status: Widowed    Outpatient Medications Prior to Visit  Medication Sig Dispense Refill   acetaminophen (TYLENOL) 650 MG CR tablet Take 1,300 mg by mouth See admin instructions. Take 1300 mg in the morning and may take an additional 1300 mg dose as needed for pain     amLODipine (NORVASC) 5 MG tablet Take 0.5 tablets (2.5 mg total) by mouth daily. 90 tablet 0   B-D ALLERGY SYRINGE 1CC/28G 28G X 1/2" 1 ML MISC USE AS DIRECTED ONCE WEEKLY 12 each 3   Calcium Carb-Cholecalciferol (CALCIUM+D3 PO) Take 1 tablet by mouth daily.     diphenhydramine-acetaminophen (TYLENOL PM) 25-500 MG TABS tablet Take 1 tablet by mouth at bedtime.     folic acid (FOLVITE) 1 MG tablet Take 1 tablet (1 mg total) by mouth daily. 90 tablet 3   losartan (COZAAR) 100 MG tablet Take 1 tablet (100 mg total) by mouth daily. 90 tablet 3   lovastatin (MEVACOR) 20 MG tablet Take 1 tablet (20 mg total) by mouth every evening. 90 tablet 3   methotrexate 50 MG/2ML injection INJECT 0.3ML INTO THE SKIN ONCE WEEKLY 6 mL 0   Multiple Vitamins-Minerals (MULTIVITAMIN ADULTS PO) Take 1 tablet by mouth daily.      pantoprazole (PROTONIX) 40 MG tablet Take 40 mg by mouth 2 (two) times daily.     SIMPONI 50 MG/0.5ML SOAJ INJECT 50MG  UNDER THE SKIN ONCE MONTHLY 0.5 mL 4   Facility-Administered Medications Prior to Visit  Medication Dose Route Frequency Provider Last Rate Last Admin   dexamethasone (DECADRON) injection 4 mg  4 mg Intra-articular Once Vivi Barrack, DPM         EXAM:  LMP 03/02/1984   There is no height or weight on file to calculate  BMI.  GENERAL: vitals reviewed and listed above, alert, oriented, appears well hydrated  and in no acute distress HEENT: atraumatic, conjunctiva  clear, no obvious abnormalities on inspection of external nose and ears OP : no lesion edema or exudate  NECK: no obvious masses on inspection palpation  LUNGS: clear to auscultation bilaterally, no wheezes, rales or rhonchi, good air movement CV: HRRR, no clubbing cyanosis or  peripheral edema nl cap refill  MS: moves all extremities without noticeable focal  abnormality PSYCH: pleasant and cooperative, no obvious depression or anxiety Lab Results  Component Value Date   WBC 5.4 05/03/2023   HGB 14.3 05/03/2023   HCT 41.8 05/03/2023   PLT 331.0 05/03/2023   GLUCOSE 98 05/03/2023   CHOL 181 05/03/2023   TRIG 144.0 05/03/2023   HDL 70.60 05/03/2023   LDLDIRECT 114.0 04/02/2010   LDLCALC 82 05/03/2023   ALT 18 05/03/2023   AST 19 05/03/2023   NA 140 05/03/2023   K 4.2 05/03/2023   CL 101 05/03/2023   CREATININE 0.80 05/03/2023   BUN 19 05/03/2023   CO2 29 05/03/2023   TSH 1.28 05/03/2023   INR 0.96 06/16/2017   HGBA1C 5.8 05/03/2023   BP Readings from Last 3 Encounters:  02/10/23 (!) 145/85  12/02/22 (!) 150/79  11/12/22 138/70    ASSESSMENT AND PLAN:  Discussed the following assessment and plan:  No diagnosis found.  -Patient advised to return or notify health care team  if  new concerns arise.  There are no Patient Instructions on file for this visit.   [name]. Philipp Callegari M.D.

## 2023-05-12 ENCOUNTER — Ambulatory Visit (INDEPENDENT_AMBULATORY_CARE_PROVIDER_SITE_OTHER): Payer: Medicare Other | Admitting: Internal Medicine

## 2023-05-12 ENCOUNTER — Encounter: Payer: Self-pay | Admitting: Internal Medicine

## 2023-05-12 VITALS — BP 130/78 | HR 80 | Temp 97.8°F | Ht 65.0 in | Wt 214.2 lb

## 2023-05-12 DIAGNOSIS — K449 Diaphragmatic hernia without obstruction or gangrene: Secondary | ICD-10-CM | POA: Diagnosis not present

## 2023-05-12 DIAGNOSIS — Z79899 Other long term (current) drug therapy: Secondary | ICD-10-CM | POA: Diagnosis not present

## 2023-05-12 DIAGNOSIS — E611 Iron deficiency: Secondary | ICD-10-CM | POA: Diagnosis not present

## 2023-05-12 DIAGNOSIS — L4050 Arthropathic psoriasis, unspecified: Secondary | ICD-10-CM | POA: Diagnosis not present

## 2023-05-12 DIAGNOSIS — I10 Essential (primary) hypertension: Secondary | ICD-10-CM

## 2023-05-12 NOTE — Patient Instructions (Addendum)
 Bp ok   after sitting and at home .  We can always add back   more medication.  Will  plan fu    4 months or when needed.

## 2023-05-13 ENCOUNTER — Encounter: Payer: Self-pay | Admitting: Internal Medicine

## 2023-05-13 ENCOUNTER — Other Ambulatory Visit: Payer: Self-pay | Admitting: *Deleted

## 2023-05-13 DIAGNOSIS — D649 Anemia, unspecified: Secondary | ICD-10-CM

## 2023-05-13 DIAGNOSIS — R1310 Dysphagia, unspecified: Secondary | ICD-10-CM

## 2023-05-13 MED ORDER — FOLIC ACID 1 MG PO TABS: 1.0000 mg | ORAL_TABLET | Freq: Every day | ORAL | 3 refills | Status: DC

## 2023-05-13 NOTE — Telephone Encounter (Signed)
 Patient contacted the office and requested a refill on Folic Acid to [facility]  Last Fill: 07/06/2022  Next Visit: 07/21/2023  Last Visit: 02/10/2023  Dx: Psoriatic arthritis   Current Dose per office note on 02/10/2023: folic acid 1 mg p.o. daily   Okay to refill Folic Acid?

## 2023-05-20 ENCOUNTER — Encounter: Payer: Self-pay | Admitting: Hematology

## 2023-05-27 ENCOUNTER — Other Ambulatory Visit: Payer: Self-pay | Admitting: Hematology

## 2023-05-28 ENCOUNTER — Telehealth: Payer: Self-pay | Admitting: Hematology

## 2023-05-28 NOTE — Telephone Encounter (Signed)
 Left patient a vm regarding upcoming appointment

## 2023-06-03 ENCOUNTER — Inpatient Hospital Stay: Attending: Hematology

## 2023-06-03 VITALS — BP 167/66 | HR 66 | Temp 98.2°F | Resp 17 | Wt 213.1 lb

## 2023-06-03 DIAGNOSIS — D509 Iron deficiency anemia, unspecified: Secondary | ICD-10-CM

## 2023-06-03 DIAGNOSIS — Z79899 Other long term (current) drug therapy: Secondary | ICD-10-CM | POA: Diagnosis not present

## 2023-06-03 DIAGNOSIS — E611 Iron deficiency: Secondary | ICD-10-CM

## 2023-06-03 MED ORDER — SODIUM CHLORIDE 0.9 % IV SOLN: INTRAVENOUS | Status: DC

## 2023-06-03 MED ORDER — SODIUM CHLORIDE 0.9 % IV SOLN
510.0000 mg | Freq: Once | INTRAVENOUS | Status: AC
  Administered 2023-06-03: 510 mg via INTRAVENOUS
  Filled 2023-06-03: qty 17

## 2023-06-03 NOTE — Patient Instructions (Signed)

## 2023-06-03 NOTE — Progress Notes (Signed)
 Pt here for feraheme infusion, patie took her tylenol and Claritin at home at 6:30 am

## 2023-06-10 ENCOUNTER — Inpatient Hospital Stay

## 2023-06-10 VITALS — BP 137/72 | HR 63 | Temp 97.9°F | Resp 16

## 2023-06-10 DIAGNOSIS — D509 Iron deficiency anemia, unspecified: Secondary | ICD-10-CM

## 2023-06-10 DIAGNOSIS — Z79899 Other long term (current) drug therapy: Secondary | ICD-10-CM | POA: Diagnosis not present

## 2023-06-10 DIAGNOSIS — E611 Iron deficiency: Secondary | ICD-10-CM

## 2023-06-10 MED ORDER — LORATADINE 10 MG PO TABS: 10.0000 mg | ORAL_TABLET | Freq: Every day | ORAL | Status: DC

## 2023-06-10 MED ORDER — SODIUM CHLORIDE 0.9 % IV SOLN
510.0000 mg | Freq: Once | INTRAVENOUS | Status: AC
  Administered 2023-06-10: 510 mg via INTRAVENOUS
  Filled 2023-06-10: qty 510

## 2023-06-10 MED ORDER — ACETAMINOPHEN 325 MG PO TABS: 650.0000 mg | ORAL_TABLET | Freq: Once | ORAL | Status: DC

## 2023-06-17 NOTE — Progress Notes (Signed)
 Surgical Instructions    Your procedure is scheduled on Tuesday, April 29th, 2025.   Report to [facility] Main Entrance "A" at 05:30 A.M., then check in with the Admitting office.  Call this number if you have problems the morning of surgery:  [phone]   If you have any questions prior to your surgery date call [phone]: Open Monday-Friday 8am-4pm If you experience any cold or flu symptoms such as cough, fever, chills, shortness of breath, etc. between now and your scheduled surgery, please notify us at the above number     Remember:  Do not eat or drink after midnight the night before your surgery    Take these medicines the morning of surgery with A SIP OF WATER:   acetaminophen (TYLENOL)  pantoprazole (PROTONIX)   carboxymethylcellulose (REFRESH PLUS) - as needed  Ask MD if you need to hold methotrexate prior to surgery  As of today, STOP taking any Aspirin (unless otherwise instructed by your surgeon) Aleve, Naproxen, Ibuprofen, Motrin, Advil, Goody's, BC's, all herbal medications, fish oil, and all vitamins.   The day of surgery:          Do not wear jewelry or makeup. Do not wear lotions, powders, perfumes or deodorant. Do not shave 48 hours prior to surgery.   Do not bring valuables to the hospital. Do not wear nail polish, gel polish, artificial nails, or any other type of covering on natural nails (fingers and toes) If you have artificial nails or gel coating that need to be removed by a nail salon, please have this removed prior to surgery. Artificial nails or gel coating may interfere with anesthesia's ability to adequately monitor your vital signs.  [facility] is not responsible for any belongings or valuables.    Do NOT Smoke (Tobacco/Vaping)  24 hours prior to your procedure  If you use a CPAP at night, you may bring your mask for your overnight stay.   Contacts, glasses, hearing aids, dentures or partials may not be worn into surgery, please bring  cases for these belongings   For patients admitted to the hospital, discharge time will be determined by your treatment team.   Patients discharged the day of surgery will not be allowed to drive home, and someone needs to stay with them for 24 hours.   SURGICAL WAITING ROOM VISITATION Patients having surgery or a procedure may have no more than 2 support people in the waiting area - these visitors may rotate.   Children under the age of 77 must have an adult with them who is not the patient. If the patient needs to stay at the hospital during part of their recovery, the visitor guidelines for inpatient rooms apply. Pre-op nurse will coordinate an appropriate time for 1 support person to accompany patient in pre-op.  This support person may not rotate.   Please refer to [email or web address] for the visitor guidelines for Inpatients (after your surgery is over and you are in a regular room).    Special instructions:    Oral Hygiene is also important to reduce your risk of infection.  Remember - BRUSH YOUR TEETH THE MORNING OF SURGERY WITH YOUR REGULAR TOOTHPASTE   [facility]- Preparing For Surgery  Before surgery, you can play an important role. Because skin is not sterile, your skin needs to be as free of germs as possible. You can reduce the number of germs on your skin by washing with CHG (chlorahexidine gluconate) Soap before surgery.  CHG is an  antiseptic cleaner which kills germs and bonds with the skin to continue killing germs even after washing.     Please do not use if you have an allergy to CHG or antibacterial soaps. If your skin becomes reddened/irritated stop using the CHG.  Do not shave (including legs and underarms) for at least 48 hours prior to first CHG shower. It is OK to shave your face.  Please follow these instructions carefully.     Shower the NIGHT BEFORE SURGERY and the MORNING OF SURGERY with CHG Soap.   If  you chose to wash your hair, wash your hair first as usual with your normal shampoo. After you shampoo, rinse your hair and body thoroughly to remove the shampoo.  Then [employer] and genitals (private parts) with your normal soap and rinse thoroughly to remove soap.  After that Use CHG Soap as you would any other liquid soap. You can apply CHG directly to the skin and wash gently with a scrungie or a clean washcloth.   Apply the CHG Soap to your body ONLY FROM THE NECK DOWN.  Do not use on open wounds or open sores. Avoid contact with your eyes, ears, mouth and genitals (private parts). Wash Face and genitals (private parts)  with your normal soap.   Wash thoroughly, paying special attention to the area where your surgery will be performed.  Thoroughly rinse your body with warm water from the neck down.  DO NOT shower/wash with your normal soap after using and rinsing off the CHG Soap.  Pat yourself dry with a CLEAN TOWEL.  Wear CLEAN PAJAMAS to bed the night before surgery  Place CLEAN SHEETS on your bed the night before your surgery  DO NOT SLEEP WITH PETS.   Day of Surgery:  Take a shower with CHG soap. Wear Clean/Comfortable clothing the morning of surgery Do not apply any deodorants/lotions.   Remember to brush your teeth WITH YOUR REGULAR TOOTHPASTE.    If you received a COVID test during your pre-op visit, it is requested that you wear a mask when out in public, stay away from anyone that may not be feeling well, and notify your surgeon if you develop symptoms. If you have been in contact with anyone that has tested positive in the last 10 days, please notify your surgeon.    Please read over the following fact sheets that you were given.

## 2023-06-21 ENCOUNTER — Encounter (HOSPITAL_COMMUNITY)
Admission: RE | Admit: 2023-06-21 | Discharge: 2023-06-21 | Disposition: A | Discharge status: Home / Self Care | Source: Ambulatory Visit | Attending: General Surgery | Admitting: General Surgery

## 2023-06-21 ENCOUNTER — Other Ambulatory Visit: Payer: Self-pay

## 2023-06-21 ENCOUNTER — Ambulatory Visit: Payer: Self-pay | Admitting: General Surgery

## 2023-06-21 ENCOUNTER — Encounter (HOSPITAL_COMMUNITY): Payer: Self-pay

## 2023-06-21 VITALS — BP 172/71 | HR 89 | Temp 98.3°F | Resp 18 | Ht 65.0 in | Wt 211.9 lb

## 2023-06-21 DIAGNOSIS — I2510 Atherosclerotic heart disease of native coronary artery without angina pectoris: Secondary | ICD-10-CM | POA: Insufficient documentation

## 2023-06-21 DIAGNOSIS — Z01818 Encounter for other preprocedural examination: Secondary | ICD-10-CM | POA: Insufficient documentation

## 2023-06-21 HISTORY — DX: Headache, unspecified: R51.9

## 2023-06-21 LAB — CBC
HCT: 41.3 % (ref 36.0–46.0)
Hemoglobin: 14.4 g/dL (ref 12.0–15.0)
MCH: 33.3 pg (ref 26.0–34.0)
MCHC: 34.9 g/dL (ref 30.0–36.0)
MCV: 95.6 fL (ref 80.0–100.0)
Platelets: 326 10*3/uL (ref 150–400)
RBC: 4.32 MIL/uL (ref 3.87–5.11)
RDW: 13.7 % (ref 11.5–15.5)
WBC: 6.2 10*3/uL (ref 4.0–10.5)
nRBC: 0 % (ref 0.0–0.2)

## 2023-06-21 LAB — BASIC METABOLIC PANEL WITH GFR
Anion gap: 10 (ref 5–15)
BUN: 14 mg/dL (ref 8–23)
CO2: 25 mmol/L (ref 22–32)
Calcium: 9.5 mg/dL (ref 8.9–10.3)
Chloride: 105 mmol/L (ref 98–111)
Creatinine, Ser: 0.87 mg/dL (ref 0.44–1.00)
GFR, Estimated: >60 mL/min (ref >60)
Glucose, Bld: 114 mg/dL — ABNORMAL HIGH (ref 70–99)
Potassium: 4.1 mmol/L (ref 3.5–5.1)
Sodium: 140 mmol/L (ref 135–145)

## 2023-06-21 NOTE — Pre-Procedure Instructions (Signed)
 Surgical Instructions   Your procedure is scheduled on June 29, 2023. Report to [facility] Main Entrance "A" at 5:30 A.M., then check in with the Admitting office. Any questions or running late day of surgery: call [phone]  Questions prior to your surgery date: call [phone], Monday-Friday, 8am-4pm. If you experience any cold or flu symptoms such as cough, fever, chills, shortness of breath, etc. between now and your scheduled surgery, please notify us  at the above number.     Remember:  Do not eat after midnight the night before your surgery   You may drink clear liquids until 4:30 AM the morning of your surgery.   Clear liquids allowed are: Water, Non-Citrus Juices (without pulp), Carbonated Beverages, Clear Tea (no milk, honey, etc.), Black Coffee Only (NO MILK, CREAM OR POWDERED CREAMER of any kind), and Gatorade.  Patient Instructions  The night before surgery:  No food after midnight. ONLY clear liquids after midnight  The day of surgery (if you do NOT have diabetes):  Drink ONE (1) Pre-Surgery Clear Ensure by 4:30 AM the morning of surgery. Drink in one sitting. Do not sip.  This drink was given to you during your hospital  pre-op appointment visit.  Nothing else to drink after completing the  Pre-Surgery Clear Ensure.         If you have questions, please contact your surgeon's office.    Take these medicines the morning of surgery with A SIP OF WATER: acetaminophen  (TYLENOL )  pantoprazole  (PROTONIX )    May take these medicines IF NEEDED: carboxymethylcellulose (REFRESH PLUS) eye drops   Please follow your prescribing physicians instruction on if/when to stop taking your methotrexate .   One week prior to surgery, STOP taking any Aspirin (unless otherwise instructed by your surgeon) Aleve , Naproxen , Ibuprofen, Motrin, Advil, Goody's, BC's, all herbal medications, fish oil, and non-prescription vitamins.                     Do NOT Smoke  (Tobacco/Vaping) for 24 hours prior to your procedure.  If you use a CPAP at night, you may bring your mask/headgear for your overnight stay.   You will be asked to remove any contacts, glasses, piercing's, hearing aid's, dentures/partials prior to surgery. Please bring cases for these items if needed.    Patients discharged the day of surgery will not be allowed to drive home, and someone needs to stay with them for 24 hours.  SURGICAL WAITING ROOM VISITATION Patients may have no more than 2 support people in the waiting area - these visitors may rotate.   Pre-op nurse will coordinate an appropriate time for 1 ADULT support person, who may not rotate, to accompany patient in pre-op.  Children under the age of 49 must have an adult with them who is not the patient and must remain in the main waiting area with an adult.  If the patient needs to stay at the hospital during part of their recovery, the visitor guidelines for inpatient rooms apply.  Please refer to the [facility] website for the visitor guidelines for any additional information.   If you received a COVID test during your pre-op visit  it is requested that you wear a mask when out in public, stay away from anyone that may not be feeling well and notify your surgeon if you develop symptoms. If you have been in contact with anyone that has tested positive in the last 10 days please notify you surgeon.  Pre-operative CHG Bathing Instructions   You can play a key role in reducing the risk of infection after surgery. Your skin needs to be as free of germs as possible. You can reduce the number of germs on your skin by washing with CHG (chlorhexidine  gluconate) soap before surgery. CHG is an antiseptic soap that kills germs and continues to kill germs even after washing.   DO NOT use if you have an allergy  to chlorhexidine /CHG or antibacterial soaps. If your skin becomes reddened or irritated, stop using the CHG and notify one of  our RNs at [phone].              TAKE A SHOWER THE NIGHT BEFORE SURGERY AND THE DAY OF SURGERY    Please keep in mind the following:  DO NOT shave, including legs and underarms, 48 hours prior to surgery.   You may shave your face before/day of surgery.  Place clean sheets on your bed the night before surgery Use a clean washcloth (not used since being washed) for each shower. DO NOT sleep with pet's night before surgery.  CHG Shower Instructions:  Wash your face and private area with normal soap. If you choose to wash your hair, wash first with your normal shampoo.  After you use shampoo/soap, rinse your hair and body thoroughly to remove shampoo/soap residue.  Turn the water OFF and apply half the bottle of CHG soap to a CLEAN washcloth.  Apply CHG soap ONLY FROM YOUR NECK DOWN TO YOUR TOES (washing for 3-5 minutes)  DO NOT use CHG soap on face, private areas, open wounds, or sores.  Pay special attention to the area where your surgery is being performed.  If you are having back surgery, having someone wash your back for you may be helpful. Wait 2 minutes after CHG soap is applied, then you may rinse off the CHG soap.  Pat dry with a clean towel  Put on clean pajamas    Additional instructions for the day of surgery: DO NOT APPLY any lotions, deodorants, cologne, or perfumes.   Do not wear jewelry or makeup Do not wear nail polish, gel polish, artificial nails, or any other type of covering on natural nails (fingers and toes) Do not bring valuables to the hospital. [facility] is not responsible for valuables/personal belongings. Put on clean/comfortable clothes.  Please brush your teeth.  Ask your nurse before applying any prescription medications to the skin.

## 2023-06-21 NOTE — Progress Notes (Signed)
 PCP - Dr. Daphine Eagle Cardiologist - Per pt, saw a cardiologist around 07/17/2006 when her husband passed away. All work-up normal with PRN follow-up  PPM/ICD - Denies Device Orders - n/a Rep Notified - n/a  Chest x-ray - n/a EKG - 06/21/2023 Stress Test - Denies ECHO - 04/05/2006 Cardiac Cath - Denies  Sleep Study - Denies CPAP - n/a  No DM  Last dose of GLP1 agonist- n/a GLP1 instructions: n/a  Blood Thinner Instructions: n/a Aspirin Instructions: n/a  ERAS Protcol - Clear liquids until 0430 morning of surgery PRE-SURGERY Ensure or G2- Ensure given to pt with instructions  COVID TEST- n/a   Anesthesia review: Yes. Hx of HTN and RBBB. Pt on Methotrexate  (last dose April 4th) and Simponi  (Last dose March 28th) for her arthritis. Pt also notes a tooth pain that started about 2.5 weeks ago. She was prescribed Amoxicillin and started it on April 11th and completed course April 19th. Per pt, she is to have tooth removed prior to surgery. Dr. Melton Squires is aware of this and is supposed to talk with dentist today. She also notes spraining her left ankle while walking her dog over the weekend. She walked into appointment today and is able to bare weight on that ankle. Pt instructed to notify Dr. Melton Squires if ankle injury worsens/have PCP evaluate pt.   Patient denies shortness of breath, fever, cough and chest pain at PAT appointment. Pt denies any respiratory illness/infection in the last two months.   All instructions explained to the patient, with a verbal understanding of the material. Patient agrees to go over the instructions while at home for a better understanding. Patient also instructed to self quarantine after being tested for COVID-19. The opportunity to ask questions was provided.

## 2023-06-28 NOTE — Progress Notes (Signed)
 Received a call from [facility]CS triage nurse. Patient had tooth extracted on 06/24/23 with bone graft and metal post for pending tooth implant. She is on prophylactic amoxicillin per periodontist. Dr. Melton Squires is okay with proceeding as scheduled. I also updated anesthesiologist Raymondo Calin, MD. Would not anticipate recent dental procedure to delay surgery. Anesthesia team to further evaluate on the day of surgery. Additional anesthesia APP chart review was previously done by Rudy Costain, PA-C last week.  Ella Gun, PA-C Surgical Short Stay/Anesthesiology [facility] Phone [phone] [facility] Phone [phone] 06/28/2023 4:32 PM

## 2023-06-28 NOTE — Anesthesia Preprocedure Evaluation (Signed)
 Anesthesia Evaluation  Patient identified by MRN, date of birth, ID band Patient awake    Reviewed: Allergy  & Precautions, NPO status , Patient's Chart, lab work & pertinent test results  History of Anesthesia Complications (+) PONV and history of anesthetic complications  Airway Mallampati: II  TM Distance: >3 FB Neck ROM: Full    Dental  (+) Dental Advisory Given, Missing, Caps   Pulmonary neg pulmonary ROS   Pulmonary exam normal breath sounds clear to auscultation       Cardiovascular hypertension, Pt. on medications (-) angina (-) Past MI Normal cardiovascular exam+ dysrhythmias (RBBB)  Rhythm:Regular Rate:Normal     Neuro/Psych  Headaches S/p ACDF  Neuromuscular disease    GI/Hepatic Neg liver ROS, hiatal hernia,GERD  Medicated and Controlled,,  Endo/Other  Obesity   Renal/GU negative Renal ROS     Musculoskeletal  (+) Arthritis ,  Fibromyalgia -  Abdominal   Peds  Hematology negative hematology ROS (+)   Anesthesia Other Findings   Reproductive/Obstetrics                             Anesthesia Physical Anesthesia Plan  ASA: 3  Anesthesia Plan: General   Post-op Pain Management: Tylenol  PO (pre-op)*   Induction: Intravenous  PONV Risk Score and Plan: 4 or greater and Midazolam , Propofol  infusion, Dexamethasone , Ondansetron  and TIVA  Airway Management Planned: Oral ETT and Video Laryngoscope Planned  Additional Equipment: ClearSight  Intra-op Plan:   Post-operative Plan: Extubation in OR  Informed Consent: I have reviewed the patients History and Physical, chart, labs and discussed the procedure including the risks, benefits and alternatives for the proposed anesthesia with the patient or authorized representative who has indicated his/her understanding and acceptance.     Dental advisory given  Plan Discussed with: CRNA  Anesthesia Plan Comments: (2nd PIV after  induction)       Anesthesia Quick Evaluation

## 2023-06-29 ENCOUNTER — Encounter (HOSPITAL_COMMUNITY): Payer: Self-pay | Admitting: General Surgery

## 2023-06-29 ENCOUNTER — Other Ambulatory Visit: Payer: Self-pay

## 2023-06-29 ENCOUNTER — Observation Stay (HOSPITAL_COMMUNITY): Payer: Self-pay | Admitting: Anesthesiology

## 2023-06-29 ENCOUNTER — Inpatient Hospital Stay (HOSPITAL_COMMUNITY)

## 2023-06-29 ENCOUNTER — Encounter (HOSPITAL_COMMUNITY)
Admission: RE | Disposition: A | Discharge status: Nursing Home (Includes ICF) | Payer: Self-pay | Source: Home / Self Care | Attending: General Surgery

## 2023-06-29 ENCOUNTER — Observation Stay (HOSPITAL_COMMUNITY): Admitting: Physician Assistant

## 2023-06-29 ENCOUNTER — Inpatient Hospital Stay (HOSPITAL_COMMUNITY)
Admission: RE | Admit: 2023-06-29 | Discharge: 2023-07-06 | DRG: 326 | Disposition: A | Discharge status: Rehabilitation Facility | Payer: Medicare Other | Attending: General Surgery | Admitting: General Surgery

## 2023-06-29 DIAGNOSIS — I82441 Acute embolism and thrombosis of right tibial vein: Secondary | ICD-10-CM | POA: Diagnosis not present

## 2023-06-29 DIAGNOSIS — Z8261 Family history of arthritis: Secondary | ICD-10-CM | POA: Diagnosis not present

## 2023-06-29 DIAGNOSIS — J9600 Acute respiratory failure, unspecified whether with hypoxia or hypercapnia: Secondary | ICD-10-CM

## 2023-06-29 DIAGNOSIS — Z96652 Presence of left artificial knee joint: Secondary | ICD-10-CM | POA: Diagnosis present

## 2023-06-29 DIAGNOSIS — Z8049 Family history of malignant neoplasm of other genital organs: Secondary | ICD-10-CM | POA: Diagnosis not present

## 2023-06-29 DIAGNOSIS — R059 Cough, unspecified: Secondary | ICD-10-CM | POA: Diagnosis not present

## 2023-06-29 DIAGNOSIS — S36032A Major laceration of spleen, initial encounter: Secondary | ICD-10-CM | POA: Diagnosis not present

## 2023-06-29 DIAGNOSIS — I82432 Acute embolism and thrombosis of left popliteal vein: Secondary | ICD-10-CM | POA: Diagnosis not present

## 2023-06-29 DIAGNOSIS — Z823 Family history of stroke: Secondary | ICD-10-CM | POA: Diagnosis not present

## 2023-06-29 DIAGNOSIS — K5900 Constipation, unspecified: Secondary | ICD-10-CM | POA: Diagnosis not present

## 2023-06-29 DIAGNOSIS — R5381 Other malaise: Secondary | ICD-10-CM | POA: Diagnosis not present

## 2023-06-29 DIAGNOSIS — I82A13 Acute embolism and thrombosis of axillary vein, bilateral: Secondary | ICD-10-CM | POA: Diagnosis not present

## 2023-06-29 DIAGNOSIS — S27892D Contusion of other specified intrathoracic organs, subsequent encounter: Secondary | ICD-10-CM | POA: Diagnosis not present

## 2023-06-29 DIAGNOSIS — J9601 Acute respiratory failure with hypoxia: Secondary | ICD-10-CM | POA: Diagnosis present

## 2023-06-29 DIAGNOSIS — R571 Hypovolemic shock: Secondary | ICD-10-CM | POA: Diagnosis not present

## 2023-06-29 DIAGNOSIS — R918 Other nonspecific abnormal finding of lung field: Secondary | ICD-10-CM | POA: Diagnosis not present

## 2023-06-29 DIAGNOSIS — D72829 Elevated white blood cell count, unspecified: Secondary | ICD-10-CM | POA: Diagnosis not present

## 2023-06-29 DIAGNOSIS — Z881 Allergy status to other antibiotic agents status: Secondary | ICD-10-CM | POA: Diagnosis not present

## 2023-06-29 DIAGNOSIS — N390 Urinary tract infection, site not specified: Secondary | ICD-10-CM | POA: Diagnosis present

## 2023-06-29 DIAGNOSIS — Z800 Family history of malignant neoplasm of digestive organs: Secondary | ICD-10-CM | POA: Diagnosis not present

## 2023-06-29 DIAGNOSIS — L4050 Arthropathic psoriasis, unspecified: Secondary | ICD-10-CM | POA: Diagnosis not present

## 2023-06-29 DIAGNOSIS — R197 Diarrhea, unspecified: Secondary | ICD-10-CM | POA: Diagnosis not present

## 2023-06-29 DIAGNOSIS — K5730 Diverticulosis of large intestine without perforation or abscess without bleeding: Secondary | ICD-10-CM | POA: Diagnosis not present

## 2023-06-29 DIAGNOSIS — Z981 Arthrodesis status: Secondary | ICD-10-CM

## 2023-06-29 DIAGNOSIS — I10 Essential (primary) hypertension: Secondary | ICD-10-CM | POA: Diagnosis present

## 2023-06-29 DIAGNOSIS — D696 Thrombocytopenia, unspecified: Secondary | ICD-10-CM | POA: Diagnosis present

## 2023-06-29 DIAGNOSIS — K449 Diaphragmatic hernia without obstruction or gangrene: Principal | ICD-10-CM | POA: Diagnosis present

## 2023-06-29 DIAGNOSIS — R0781 Pleurodynia: Secondary | ICD-10-CM | POA: Diagnosis not present

## 2023-06-29 DIAGNOSIS — E785 Hyperlipidemia, unspecified: Secondary | ICD-10-CM

## 2023-06-29 DIAGNOSIS — Z888 Allergy status to other drugs, medicaments and biological substances status: Secondary | ICD-10-CM

## 2023-06-29 DIAGNOSIS — M797 Fibromyalgia: Secondary | ICD-10-CM | POA: Diagnosis present

## 2023-06-29 DIAGNOSIS — Z8674 Personal history of sudden cardiac arrest: Secondary | ICD-10-CM | POA: Diagnosis not present

## 2023-06-29 DIAGNOSIS — I469 Cardiac arrest, cause unspecified: Secondary | ICD-10-CM | POA: Diagnosis not present

## 2023-06-29 DIAGNOSIS — K567 Ileus, unspecified: Secondary | ICD-10-CM | POA: Diagnosis not present

## 2023-06-29 DIAGNOSIS — T148XXA Other injury of unspecified body region, initial encounter: Secondary | ICD-10-CM | POA: Diagnosis not present

## 2023-06-29 DIAGNOSIS — D62 Acute posthemorrhagic anemia: Secondary | ICD-10-CM | POA: Diagnosis not present

## 2023-06-29 DIAGNOSIS — E162 Hypoglycemia, unspecified: Secondary | ICD-10-CM | POA: Diagnosis not present

## 2023-06-29 DIAGNOSIS — I509 Heart failure, unspecified: Secondary | ICD-10-CM | POA: Diagnosis not present

## 2023-06-29 DIAGNOSIS — K920 Hematemesis: Secondary | ICD-10-CM | POA: Diagnosis present

## 2023-06-29 DIAGNOSIS — Z833 Family history of diabetes mellitus: Secondary | ICD-10-CM

## 2023-06-29 DIAGNOSIS — E441 Mild protein-calorie malnutrition: Secondary | ICD-10-CM | POA: Diagnosis not present

## 2023-06-29 DIAGNOSIS — K91840 Postprocedural hemorrhage and hematoma of a digestive system organ or structure following a digestive system procedure: Secondary | ICD-10-CM | POA: Diagnosis not present

## 2023-06-29 DIAGNOSIS — N281 Cyst of kidney, acquired: Secondary | ICD-10-CM | POA: Diagnosis not present

## 2023-06-29 DIAGNOSIS — Z9049 Acquired absence of other specified parts of digestive tract: Secondary | ICD-10-CM | POA: Diagnosis not present

## 2023-06-29 DIAGNOSIS — Y658 Other specified misadventures during surgical and medical care: Secondary | ICD-10-CM | POA: Diagnosis not present

## 2023-06-29 DIAGNOSIS — K279 Peptic ulcer, site unspecified, unspecified as acute or chronic, without hemorrhage or perforation: Secondary | ICD-10-CM | POA: Diagnosis present

## 2023-06-29 DIAGNOSIS — I824Y3 Acute embolism and thrombosis of unspecified deep veins of proximal lower extremity, bilateral: Secondary | ICD-10-CM | POA: Diagnosis not present

## 2023-06-29 DIAGNOSIS — Z83438 Family history of other disorder of lipoprotein metabolism and other lipidemia: Secondary | ICD-10-CM | POA: Diagnosis not present

## 2023-06-29 DIAGNOSIS — D7812 Accidental puncture and laceration of the spleen during other procedure: Secondary | ICD-10-CM | POA: Diagnosis not present

## 2023-06-29 DIAGNOSIS — I82453 Acute embolism and thrombosis of peroneal vein, bilateral: Secondary | ICD-10-CM | POA: Diagnosis not present

## 2023-06-29 DIAGNOSIS — K219 Gastro-esophageal reflux disease without esophagitis: Secondary | ICD-10-CM | POA: Diagnosis present

## 2023-06-29 DIAGNOSIS — Z4682 Encounter for fitting and adjustment of non-vascular catheter: Secondary | ICD-10-CM | POA: Diagnosis not present

## 2023-06-29 DIAGNOSIS — S35298A Other injury of branches of celiac and mesenteric artery, initial encounter: Secondary | ICD-10-CM | POA: Diagnosis not present

## 2023-06-29 DIAGNOSIS — Z9911 Dependence on respirator [ventilator] status: Secondary | ICD-10-CM

## 2023-06-29 DIAGNOSIS — I110 Hypertensive heart disease with heart failure: Secondary | ICD-10-CM | POA: Diagnosis not present

## 2023-06-29 DIAGNOSIS — I701 Atherosclerosis of renal artery: Secondary | ICD-10-CM | POA: Diagnosis not present

## 2023-06-29 DIAGNOSIS — Z634 Disappearance and death of family member: Secondary | ICD-10-CM

## 2023-06-29 DIAGNOSIS — Z8249 Family history of ischemic heart disease and other diseases of the circulatory system: Secondary | ICD-10-CM | POA: Diagnosis not present

## 2023-06-29 DIAGNOSIS — Z91041 Radiographic dye allergy status: Secondary | ICD-10-CM

## 2023-06-29 DIAGNOSIS — Z90722 Acquired absence of ovaries, bilateral: Secondary | ICD-10-CM

## 2023-06-29 DIAGNOSIS — D649 Anemia, unspecified: Secondary | ICD-10-CM | POA: Diagnosis not present

## 2023-06-29 DIAGNOSIS — Z8719 Personal history of other diseases of the digestive system: Secondary | ICD-10-CM

## 2023-06-29 DIAGNOSIS — K5901 Slow transit constipation: Secondary | ICD-10-CM | POA: Diagnosis not present

## 2023-06-29 DIAGNOSIS — R042 Hemoptysis: Secondary | ICD-10-CM | POA: Diagnosis not present

## 2023-06-29 DIAGNOSIS — Z860100 Personal history of colon polyps, unspecified: Secondary | ICD-10-CM

## 2023-06-29 DIAGNOSIS — I82412 Acute embolism and thrombosis of left femoral vein: Secondary | ICD-10-CM | POA: Diagnosis not present

## 2023-06-29 DIAGNOSIS — R0989 Other specified symptoms and signs involving the circulatory and respiratory systems: Secondary | ICD-10-CM | POA: Diagnosis not present

## 2023-06-29 DIAGNOSIS — J9811 Atelectasis: Secondary | ICD-10-CM | POA: Diagnosis not present

## 2023-06-29 DIAGNOSIS — R578 Other shock: Secondary | ICD-10-CM | POA: Diagnosis not present

## 2023-06-29 DIAGNOSIS — Z9889 Other specified postprocedural states: Secondary | ICD-10-CM | POA: Diagnosis not present

## 2023-06-29 DIAGNOSIS — Z90710 Acquired absence of both cervix and uterus: Secondary | ICD-10-CM

## 2023-06-29 DIAGNOSIS — R7989 Other specified abnormal findings of blood chemistry: Secondary | ICD-10-CM | POA: Diagnosis not present

## 2023-06-29 HISTORY — PX: XI ROBOTIC ASSISTED HIATAL HERNIA REPAIR: SHX6889

## 2023-06-29 HISTORY — PX: HIATAL HERNIA REPAIR: SHX195

## 2023-06-29 HISTORY — PX: LAPAROTOMY: SHX154

## 2023-06-29 LAB — COMPREHENSIVE METABOLIC PANEL WITH GFR
ALT: 418 U/L — ABNORMAL HIGH (ref 0–44)
AST: 377 U/L — ABNORMAL HIGH (ref 15–41)
Albumin: 3.9 g/dL (ref 3.5–5.0)
Alkaline Phosphatase: 34 U/L — ABNORMAL LOW (ref 38–126)
Anion gap: 10 (ref 5–15)
BUN: 15 mg/dL (ref 8–23)
CO2: 26 mmol/L (ref 22–32)
Calcium: 9.2 mg/dL (ref 8.9–10.3)
Chloride: 106 mmol/L (ref 98–111)
Creatinine, Ser: 0.93 mg/dL (ref 0.44–1.00)
GFR, Estimated: >60 mL/min (ref >60)
Glucose, Bld: 204 mg/dL — ABNORMAL HIGH (ref 70–99)
Potassium: 3.5 mmol/L (ref 3.5–5.1)
Sodium: 142 mmol/L (ref 135–145)
Total Bilirubin: 3.2 mg/dL — ABNORMAL HIGH (ref 0.0–1.2)
Total Protein: 5.4 g/dL — ABNORMAL LOW (ref 6.5–8.1)

## 2023-06-29 LAB — CBC WITH DIFFERENTIAL/PLATELET
Abs Immature Granulocytes: 0.06 10*3/uL (ref 0.00–0.07)
Basophils Absolute: 0 10*3/uL (ref 0.0–0.1)
Basophils Relative: 0 %
Eosinophils Absolute: 0 10*3/uL (ref 0.0–0.5)
Eosinophils Relative: 0 %
HCT: 28.2 % — ABNORMAL LOW (ref 36.0–46.0)
Hemoglobin: 10.2 g/dL — ABNORMAL LOW (ref 12.0–15.0)
Immature Granulocytes: 0 %
Lymphocytes Relative: 3 %
Lymphs Abs: 0.5 10*3/uL — ABNORMAL LOW (ref 0.7–4.0)
MCH: 30.5 pg (ref 26.0–34.0)
MCHC: 36.2 g/dL — ABNORMAL HIGH (ref 30.0–36.0)
MCV: 84.4 fL (ref 80.0–100.0)
Monocytes Absolute: 0.4 10*3/uL (ref 0.1–1.0)
Monocytes Relative: 3 %
Neutro Abs: 13.4 10*3/uL — ABNORMAL HIGH (ref 1.7–7.7)
Neutrophils Relative %: 94 %
Platelets: 118 10*3/uL — ABNORMAL LOW (ref 150–400)
RBC: 3.34 MIL/uL — ABNORMAL LOW (ref 3.87–5.11)
RDW: 14.8 % (ref 11.5–15.5)
WBC: 14.3 10*3/uL — ABNORMAL HIGH (ref 4.0–10.5)
nRBC: 0 % (ref 0.0–0.2)

## 2023-06-29 LAB — PREPARE PLATELET PHERESIS: Unit division: 0

## 2023-06-29 LAB — BPAM FFP
Blood Product Expiration Date: 202505032359 *Deleted
ISSUE DATE / TIME: 202504290924 *Deleted
Unit Type and Rh: 6200 *Deleted

## 2023-06-29 LAB — POCT I-STAT 7, (LYTES, BLD GAS, ICA,H+H)
Acid-base deficit: 2 mmol/L (ref 0.0–2.0)
Bicarbonate: 23.6 mmol/L (ref 20.0–28.0)
Calcium, Ion: 1.26 mmol/L (ref 1.15–1.40)
HCT: 26 % — ABNORMAL LOW (ref 36.0–46.0)
Hemoglobin: 8.8 g/dL — ABNORMAL LOW (ref 12.0–15.0)
O2 Saturation: 99 %
Patient temperature: 97.5
Potassium: 3.6 mmol/L (ref 3.5–5.1)
Sodium: 142 mmol/L (ref 135–145)
TCO2: 25 mmol/L (ref 22–32)
pCO2 arterial: 39.7 mmHg (ref 32–48)
pH, Arterial: 7.379 (ref 7.35–7.45)
pO2, Arterial: 149 mmHg — ABNORMAL HIGH (ref 83–108)

## 2023-06-29 LAB — MASSIVE TRANSFUSION PROTOCOL ORDER (BLOOD BANK NOTIFICATION)

## 2023-06-29 LAB — BPAM CRYOPRECIPITATE
Blood Product Expiration Date: 202505042359
Unit Type and Rh: 5100

## 2023-06-29 LAB — PREPARE CRYOPRECIPITATE: Unit division: 0

## 2023-06-29 LAB — BPAM RBC
Blood Product Expiration Date: 202505142359 *Deleted
Unit Type and Rh: 5100 *Deleted

## 2023-06-29 LAB — GLUCOSE, CAPILLARY
Glucose-Capillary: 254 mg/dL — ABNORMAL HIGH (ref 70–99)
Glucose-Capillary: 202 mg/dL — ABNORMAL HIGH (ref 70–99)
Glucose-Capillary: 104 mg/dL — ABNORMAL HIGH (ref 70–99)
Glucose-Capillary: 74 mg/dL (ref 70–99)

## 2023-06-29 LAB — BPAM PLATELET PHERESIS
Blood Product Expiration Date: 202505022359
ISSUE DATE / TIME: 202504290930
Unit Type and Rh: 5100

## 2023-06-29 LAB — PROTIME-INR
INR: 1.6 — ABNORMAL HIGH (ref 0.8–1.2)
Prothrombin Time: 19.4 s — ABNORMAL HIGH (ref 11.4–15.2)

## 2023-06-29 LAB — MRSA NEXT GEN BY PCR, NASAL: MRSA by PCR Next Gen: NOT DETECTED

## 2023-06-29 LAB — APTT: aPTT: 40 s — ABNORMAL HIGH (ref 24–36)

## 2023-06-29 SURGERY — REPAIR, HERNIA, HIATAL, ROBOT-ASSISTED
Anesthesia: General | Site: Abdomen

## 2023-06-29 MED ORDER — FENTANYL CITRATE (PF) 250 MCG/5ML IJ SOLN
INTRAMUSCULAR | Status: AC
  Filled 2023-06-29: qty 5

## 2023-06-29 MED ORDER — FENTANYL 2500MCG IN NS 250ML (10MCG/ML) PREMIX INFUSION: 0.0000 ug/h | INTRAVENOUS | Status: DC

## 2023-06-29 MED ORDER — BUPIVACAINE-EPINEPHRINE 0.25% -1:200000 IJ SOLN
INTRAMUSCULAR | Status: DC | PRN
  Administered 2023-06-29: 18 mL

## 2023-06-29 MED ORDER — CHLORHEXIDINE GLUCONATE CLOTH 2 % EX PADS
6.0000 | MEDICATED_PAD | Freq: Every day | CUTANEOUS | Status: DC
  Administered 2023-06-29 – 2023-06-30 (×2): 6 via TOPICAL

## 2023-06-29 MED ORDER — CHLORHEXIDINE GLUCONATE CLOTH 2 % EX PADS
6.0000 | MEDICATED_PAD | Freq: Once | CUTANEOUS | Status: AC
  Administered 2023-06-29: 6 via TOPICAL

## 2023-06-29 MED ORDER — ORAL CARE MOUTH RINSE: 15.0000 mL | Freq: Once | OROMUCOSAL | Status: AC

## 2023-06-29 MED ORDER — DEXTROSE-SODIUM CHLORIDE 5-0.9 % IV SOLN
INTRAVENOUS | Status: DC
Start: 2023-06-29 — End: 2023-06-29

## 2023-06-29 MED ORDER — FENTANYL BOLUS VIA INFUSION
25.0000 ug | INTRAVENOUS | Status: DC | PRN
  Administered 2023-06-29 (×3): 50 ug via INTRAVENOUS
  Administered 2023-06-30 (×2): 100 ug via INTRAVENOUS

## 2023-06-29 MED ORDER — BUPIVACAINE-EPINEPHRINE (PF) 0.25% -1:200000 IJ SOLN
INTRAMUSCULAR | Status: AC
  Filled 2023-06-29: qty 30

## 2023-06-29 MED ORDER — CALCIUM CHLORIDE 10 % IV SOLN
INTRAVENOUS | Status: DC | PRN
  Administered 2023-06-29 (×3): 1 g via INTRAVENOUS

## 2023-06-29 MED ORDER — FENTANYL 2500MCG IN NS 250ML (10MCG/ML) PREMIX INFUSION
25.0000 ug/h | INTRAVENOUS | Status: DC
  Administered 2023-06-29: 50 ug/h via INTRAVENOUS

## 2023-06-29 MED ORDER — LACTATED RINGERS IV SOLN: INTRAVENOUS | Status: DC

## 2023-06-29 MED ORDER — VASOPRESSIN 20 UNIT/ML IV SOLN
INTRAVENOUS | Status: AC
  Filled 2023-06-29: qty 1

## 2023-06-29 MED ORDER — 0.9 % SODIUM CHLORIDE (POUR BTL) OPTIME
TOPICAL | Status: DC | PRN
  Administered 2023-06-29 (×2): 1000 mL
  Administered 2023-06-29: 2000 mL

## 2023-06-29 MED ORDER — HYDROMORPHONE HCL 1 MG/ML IJ SOLN
1.0000 mg | INTRAMUSCULAR | Status: DC | PRN
  Administered 2023-06-30 (×2): 1 mg via INTRAVENOUS
  Administered 2023-06-30 – 2023-07-04 (×2): 2 mg via INTRAVENOUS
  Filled 2023-06-29: qty 1
  Filled 2023-06-29: qty 2
  Filled 2023-06-29: qty 1
  Filled 2023-06-29: qty 2
  Filled 2023-06-29 (×4): qty 1

## 2023-06-29 MED ORDER — LIDOCAINE 2% (20 MG/ML) 5 ML SYRINGE
INTRAMUSCULAR | Status: AC
  Filled 2023-06-29: qty 5

## 2023-06-29 MED ORDER — DEXMEDETOMIDINE HCL IN NACL 200 MCG/50ML IV SOLN
INTRAVENOUS | Status: DC | PRN
  Administered 2023-06-29: 8 ug via INTRAVENOUS
  Administered 2023-06-29: 4 ug via INTRAVENOUS

## 2023-06-29 MED ORDER — MIDAZOLAM HCL 2 MG/2ML IJ SOLN
INTRAMUSCULAR | Status: AC
  Filled 2023-06-29: qty 2

## 2023-06-29 MED ORDER — EPINEPHRINE HCL 5 MG/250ML IV SOLN IN NS
INTRAVENOUS | Status: DC | PRN
  Administered 2023-06-29: 8 ug/min via INTRAVENOUS

## 2023-06-29 MED ORDER — ACETAMINOPHEN 500 MG PO TABS
1000.0000 mg | ORAL_TABLET | ORAL | Status: DC
Start: 2023-06-29 — End: 2023-06-29
  Filled 2023-06-29: qty 2

## 2023-06-29 MED ORDER — ALBUMIN HUMAN 5 % IV SOLN: INTRAVENOUS | Status: DC | PRN

## 2023-06-29 MED ORDER — DOCUSATE SODIUM 50 MG/5ML PO LIQD
100.0000 mg | Freq: Two times a day (BID) | ORAL | Status: DC
  Administered 2023-06-29 – 2023-06-30 (×2): 100 mg
  Filled 2023-06-29 (×3): qty 10

## 2023-06-29 MED ORDER — ARTIFICIAL TEARS OPHTHALMIC OINT
TOPICAL_OINTMENT | OPHTHALMIC | Status: AC
  Filled 2023-06-29: qty 3.5

## 2023-06-29 MED ORDER — CHLORHEXIDINE GLUCONATE CLOTH 2 % EX PADS: 6.0000 | MEDICATED_PAD | Freq: Once | CUTANEOUS | Status: DC

## 2023-06-29 MED ORDER — PROPOFOL 1000 MG/100ML IV EMUL
0.0000 ug/kg/min | INTRAVENOUS | Status: DC
  Administered 2023-06-29 – 2023-06-30 (×3): 30 ug/kg/min via INTRAVENOUS
  Filled 2023-06-29 (×4): qty 100

## 2023-06-29 MED ORDER — VASOPRESSIN 20 UNIT/ML IV SOLN
INTRAVENOUS | Status: DC | PRN
  Administered 2023-06-29 (×5): 1 [IU] via INTRAVENOUS

## 2023-06-29 MED ORDER — INSULIN ASPART 100 UNIT/ML IJ SOLN
0.0000 [IU] | INTRAMUSCULAR | Status: DC
  Administered 2023-06-29: 5 [IU] via SUBCUTANEOUS

## 2023-06-29 MED ORDER — LIDOCAINE 2% (20 MG/ML) 5 ML SYRINGE
INTRAMUSCULAR | Status: DC | PRN
  Administered 2023-06-29: 100 mg via INTRAVENOUS

## 2023-06-29 MED ORDER — BUPIVACAINE LIPOSOME 1.3 % IJ SUSP
INTRAMUSCULAR | Status: AC
  Filled 2023-06-29: qty 20

## 2023-06-29 MED ORDER — SODIUM BICARBONATE 8.4 % IV SOLN
INTRAVENOUS | Status: DC | PRN
  Administered 2023-06-29 (×2): 50 meq via INTRAVENOUS

## 2023-06-29 MED ORDER — ONDANSETRON 4 MG PO TBDP
4.0000 mg | ORAL_TABLET | Freq: Four times a day (QID) | ORAL | Status: DC | PRN
  Administered 2023-07-01 – 2023-07-06 (×2): 4 mg via ORAL
  Filled 2023-06-29 (×2): qty 1

## 2023-06-29 MED ORDER — DEXAMETHASONE SODIUM PHOSPHATE 10 MG/ML IJ SOLN
INTRAMUSCULAR | Status: DC | PRN
Start: 2023-06-29 — End: 2023-06-29
  Administered 2023-06-29: 4 mg via INTRAVENOUS

## 2023-06-29 MED ORDER — BUPIVACAINE LIPOSOME 1.3 % IJ SUSP
INTRAMUSCULAR | Status: DC | PRN
  Administered 2023-06-29: 20 mL

## 2023-06-29 MED ORDER — ROCURONIUM BROMIDE 10 MG/ML (PF) SYRINGE
PREFILLED_SYRINGE | INTRAVENOUS | Status: DC | PRN
  Administered 2023-06-29: 50 mg via INTRAVENOUS
  Administered 2023-06-29: 60 mg via INTRAVENOUS
  Administered 2023-06-29: 50 mg via INTRAVENOUS
  Administered 2023-06-29: 20 mg via INTRAVENOUS
  Administered 2023-06-29 (×2): 10 mg via INTRAVENOUS

## 2023-06-29 MED ORDER — ENSURE PRE-SURGERY PO LIQD: 296.0000 mL | Freq: Once | ORAL | Status: DC

## 2023-06-29 MED ORDER — EPINEPHRINE 1 MG/10ML IJ SOSY
PREFILLED_SYRINGE | INTRAMUSCULAR | Status: AC
  Filled 2023-06-29: qty 20

## 2023-06-29 MED ORDER — PHENYLEPHRINE 80 MCG/ML (10ML) SYRINGE FOR IV PUSH (FOR BLOOD PRESSURE SUPPORT)
PREFILLED_SYRINGE | INTRAVENOUS | Status: AC
  Filled 2023-06-29: qty 10

## 2023-06-29 MED ORDER — ROCURONIUM BROMIDE 10 MG/ML (PF) SYRINGE
PREFILLED_SYRINGE | INTRAVENOUS | Status: AC
  Filled 2023-06-29: qty 30

## 2023-06-29 MED ORDER — NOREPINEPHRINE 4 MG/250ML-% IV SOLN
INTRAVENOUS | Status: DC | PRN
  Administered 2023-06-29: 20 ug/min via INTRAVENOUS

## 2023-06-29 MED ORDER — DEXAMETHASONE SODIUM PHOSPHATE 10 MG/ML IJ SOLN
INTRAMUSCULAR | Status: AC
  Filled 2023-06-29: qty 1

## 2023-06-29 MED ORDER — FENTANYL CITRATE PF 50 MCG/ML IJ SOSY: 25.0000 ug | PREFILLED_SYRINGE | Freq: Once | INTRAMUSCULAR | Status: DC

## 2023-06-29 MED ORDER — CEFAZOLIN SODIUM-DEXTROSE 2-4 GM/100ML-% IV SOLN
2.0000 g | INTRAVENOUS | Status: AC
  Administered 2023-06-29: 2 g via INTRAVENOUS
  Filled 2023-06-29: qty 100

## 2023-06-29 MED ORDER — PROPOFOL 10 MG/ML IV BOLUS
INTRAVENOUS | Status: AC
  Filled 2023-06-29: qty 20

## 2023-06-29 MED ORDER — FENTANYL CITRATE (PF) 250 MCG/5ML IJ SOLN
INTRAMUSCULAR | Status: DC | PRN
  Administered 2023-06-29 (×2): 50 ug via INTRAVENOUS
  Administered 2023-06-29: 100 ug via INTRAVENOUS

## 2023-06-29 MED ORDER — ORAL CARE MOUTH RINSE
15.0000 mL | OROMUCOSAL | Status: DC
  Administered 2023-06-29 – 2023-07-05 (×35): 15 mL via OROMUCOSAL

## 2023-06-29 MED ORDER — HYDRALAZINE HCL 20 MG/ML IJ SOLN
10.0000 mg | INTRAMUSCULAR | Status: DC | PRN
  Administered 2023-06-29: 10 mg via INTRAVENOUS
  Filled 2023-06-29: qty 1

## 2023-06-29 MED ORDER — SODIUM CHLORIDE 0.9% FLUSH
INTRAVENOUS | Status: DC | PRN
  Administered 2023-06-29: 20 mL via INTRAVENOUS

## 2023-06-29 MED ORDER — MIDAZOLAM-SODIUM CHLORIDE 100-0.9 MG/100ML-% IV SOLN: 0.5000 mg/h | INTRAVENOUS | Status: DC

## 2023-06-29 MED ORDER — VASOPRESSIN 20 UNITS/100 ML INFUSION FOR SHOCK
INTRAVENOUS | Status: DC | PRN
  Administered 2023-06-29: .03 [IU]/min via INTRAVENOUS

## 2023-06-29 MED ORDER — ONDANSETRON HCL 4 MG/2ML IJ SOLN
4.0000 mg | Freq: Four times a day (QID) | INTRAMUSCULAR | Status: DC | PRN
  Administered 2023-07-02 – 2023-07-05 (×6): 4 mg via INTRAVENOUS
  Filled 2023-06-29 (×6): qty 2

## 2023-06-29 MED ORDER — PHENYLEPHRINE HCL-NACL 20-0.9 MG/250ML-% IV SOLN
INTRAVENOUS | Status: DC | PRN
  Administered 2023-06-29: 100 ug/min via INTRAVENOUS

## 2023-06-29 MED ORDER — MIDAZOLAM HCL 2 MG/2ML IJ SOLN
INTRAMUSCULAR | Status: DC | PRN
  Administered 2023-06-29: 2 mg via INTRAVENOUS

## 2023-06-29 MED ORDER — POLYETHYLENE GLYCOL 3350 17 G PO PACK
17.0000 g | PACK | Freq: Every day | ORAL | Status: DC
  Administered 2023-06-30: 17 g
  Filled 2023-06-29 (×2): qty 1

## 2023-06-29 MED ORDER — FENTANYL CITRATE (PF) 250 MCG/5ML IJ SOLN
INTRAMUSCULAR | Status: AC
Start: 2023-06-29 — End: ?
  Filled 2023-06-29: qty 5

## 2023-06-29 MED ORDER — LABETALOL HCL 5 MG/ML IV SOLN
10.0000 mg | INTRAVENOUS | Status: DC | PRN
  Administered 2023-06-29 (×2): 10 mg via INTRAVENOUS
  Filled 2023-06-29 (×2): qty 4

## 2023-06-29 MED ORDER — EPHEDRINE 5 MG/ML INJ
INTRAVENOUS | Status: AC
  Filled 2023-06-29: qty 5

## 2023-06-29 MED ORDER — PROPOFOL 10 MG/ML IV BOLUS
INTRAVENOUS | Status: DC | PRN
  Administered 2023-06-29: 150 ug/kg/min via INTRAVENOUS
  Administered 2023-06-29: 50 mg via INTRAVENOUS
  Administered 2023-06-29: 100 mg via INTRAVENOUS

## 2023-06-29 MED ORDER — ORAL CARE MOUTH RINSE: 15.0000 mL | OROMUCOSAL | Status: DC | PRN

## 2023-06-29 MED ORDER — HEMOSTATIC AGENTS (NO CHARGE) OPTIME
TOPICAL | Status: DC | PRN
  Administered 2023-06-29: 2 via TOPICAL

## 2023-06-29 MED ORDER — CHLORHEXIDINE GLUCONATE 0.12 % MT SOLN
15.0000 mL | Freq: Once | OROMUCOSAL | Status: AC
  Administered 2023-06-29: 15 mL via OROMUCOSAL
  Filled 2023-06-29: qty 15

## 2023-06-29 MED ORDER — EPINEPHRINE 1 MG/10ML IJ SOSY
PREFILLED_SYRINGE | INTRAMUSCULAR | Status: DC | PRN
  Administered 2023-06-29: 500 ug via INTRAVENOUS
  Administered 2023-06-29: 100 ug via INTRAVENOUS
  Administered 2023-06-29: 900 ug via INTRAVENOUS

## 2023-06-29 SURGICAL SUPPLY — 74 items
CANNULA REDUCER 12-8 DVNC XI (CANNULA) ×1 IMPLANT
CHLORAPREP W/TINT 26 (MISCELLANEOUS) ×1 IMPLANT
CLIP APPLIE 5 13 M/L LIGAMAX5 (MISCELLANEOUS) IMPLANT
CLIP TI MEDIUM 24 (CLIP) IMPLANT
CLIP TI WIDE RED SMALL 24 (CLIP) IMPLANT
COVER MAYO STAND STRL (DRAPES) ×1 IMPLANT
COVER SURGICAL LIGHT HANDLE (MISCELLANEOUS) ×1 IMPLANT
COVER TIP SHEARS 8 DVNC (MISCELLANEOUS) IMPLANT
DEFOGGER SCOPE WARM SEASHARP (MISCELLANEOUS) IMPLANT
DERMABOND ADVANCED .7 DNX12 (GAUZE/BANDAGES/DRESSINGS) ×1 IMPLANT
DEVICE TROCAR PUNCTURE CLOSURE (ENDOMECHANICALS) ×1 IMPLANT
DRAPE ARM DVNC X/XI (DISPOSABLE) ×4 IMPLANT
DRAPE CARDIOVASC SPLIT 88X140 (DRAPES) ×1 IMPLANT
DRAPE COLUMN DVNC XI (DISPOSABLE) ×1 IMPLANT
DRAPE HALF SHEET 40X57 (DRAPES) IMPLANT
DRAPE SURG ORHT 6 SPLT 77X108 (DRAPES) ×1 IMPLANT
DRIVER NDL MEGA SUTCUT DVNCXI (INSTRUMENTS) ×1 IMPLANT
DRIVER NDLE MEGA SUTCUT DVNCXI (INSTRUMENTS) ×1 IMPLANT
DRSG OPSITE POSTOP 4X10 (GAUZE/BANDAGES/DRESSINGS) IMPLANT
ELECT BLADE 6.5 EXT (BLADE) IMPLANT
ELECTRODE REM PT RTRN 9FT ADLT (ELECTROSURGICAL) ×1 IMPLANT
FORCEPS BPLR FENES DVNC XI (FORCEP) ×1 IMPLANT
G-TUBE MIC BOLUS 16FR ENFIT (TUBING) IMPLANT
GAUZE SPONGE 4X4 12PLY STRL (GAUZE/BANDAGES/DRESSINGS) IMPLANT
GLOVE BIO SURGEON STRL SZ7.5 (GLOVE) ×5 IMPLANT
GLOVE BIOGEL PI IND STRL 8 (GLOVE) IMPLANT
GOWN STRL REUS W/ TWL LRG LVL3 (GOWN DISPOSABLE) ×1 IMPLANT
GOWN STRL REUS W/ TWL XL LVL3 (GOWN DISPOSABLE) ×2 IMPLANT
GOWN STRL REUS W/TWL 2XL LVL3 (GOWN DISPOSABLE) ×1 IMPLANT
HANDLE SUCTION POOLE (INSTRUMENTS) IMPLANT
HEMOSTAT SURGICEL 2X14 (HEMOSTASIS) IMPLANT
IRRIGATION SUCT STRKRFLW 2 WTP (MISCELLANEOUS) ×1 IMPLANT
IRRIGATOR SUCT 8 DISP DVNC XI (IRRIGATION / IRRIGATOR) IMPLANT
KIT BASIN OR (CUSTOM PROCEDURE TRAY) ×1 IMPLANT
KIT TURNOVER KIT B (KITS) IMPLANT
MARKER SKIN DUAL TIP RULER LAB (MISCELLANEOUS) ×1 IMPLANT
NDL 22X1.5 STRL (OR ONLY) (MISCELLANEOUS) ×1 IMPLANT
NDL INSUFFLATION 14GA 120MM (NEEDLE) ×1 IMPLANT
NEEDLE 22X1.5 STRL (OR ONLY) (MISCELLANEOUS) ×1 IMPLANT
NEEDLE INSUFFLATION 14GA 120MM (NEEDLE) ×1 IMPLANT
OBTURATOR OPTICALSTD 8 DVNC (TROCAR) IMPLANT
PENCIL BUTTON HOLSTER BLD 10FT (ELECTRODE) IMPLANT
PENCIL SMOKE EVACUATOR (MISCELLANEOUS) IMPLANT
RETRACTOR GRSP SML 8 DVNC XI (INSTRUMENTS) ×1 IMPLANT
SCISSORS LAP 5X35 DISP (ENDOMECHANICALS) IMPLANT
SCISSORS MNPLR CVD DVNC XI (INSTRUMENTS) IMPLANT
SEAL UNIV 5-12 XI (MISCELLANEOUS) ×4 IMPLANT
SEALER VESSEL EXT DVNC XI (MISCELLANEOUS) ×1 IMPLANT
SET TUBE SMOKE EVAC HIGH FLOW (TUBING) ×1 IMPLANT
SPONGE T-LAP 18X18 ~~LOC~~+RFID (SPONGE) IMPLANT
STAPLER SKIN PROX WIDE 3.9 (STAPLE) IMPLANT
STAPLER VISISTAT 35W (STAPLE) IMPLANT
STOPCOCK 4 WAY LG BORE MALE ST (IV SETS) ×1 IMPLANT
SUT CHROMIC 0 CTX 36 (SUTURE) IMPLANT
SUT ETHIBOND 0 36 GRN (SUTURE) ×2 IMPLANT
SUT ETHIBOND 2 0 SH 36X2 (SUTURE) IMPLANT
SUT MNCRL AB 4-0 PS2 18 (SUTURE) ×1 IMPLANT
SUT PDS AB 1 TP1 54 (SUTURE) IMPLANT
SUT PROLENE 2 0 MH 48 (SUTURE) IMPLANT
SUT PROLENE 4-0 RB1 .5 CRCL 36 (SUTURE) IMPLANT
SUT SILK 0 SH 30 (SUTURE) ×1 IMPLANT
SUT SILK 0 TIES 10X30 (SUTURE) IMPLANT
SUT SILK 2 0 SH (SUTURE) IMPLANT
SUT SILK 2 0 SH CR/8 (SUTURE) IMPLANT
SUT SILK 3 0 SH CR/8 (SUTURE) IMPLANT
SUT VICRYL 0 UR6 27IN ABS (SUTURE) ×1 IMPLANT
SYR 30ML SLIP (SYRINGE) ×1 IMPLANT
TRAY FOLEY MTR SLVR 16FR STAT (SET/KITS/TRAYS/PACK) ×1 IMPLANT
TRAY LAPAROSCOPIC MC (CUSTOM PROCEDURE TRAY) ×1 IMPLANT
TROCAR ADV FIXATION 5X100MM (TROCAR) ×1 IMPLANT
TUBE CONNECTING 12X1/4 (SUCTIONS) IMPLANT
TUBE GASTRO BOLUS ENFIT 16 FR (TUBING) ×1 IMPLANT
TUBING ANTICOAG CELL SAVER (IV SETS) IMPLANT
YANKAUER SUCT BULB TIP NO VENT (SUCTIONS) IMPLANT

## 2023-06-29 NOTE — Progress Notes (Signed)
 Dr. Waylan Haggard at bedside and gave order to discontinue D5 NS order. RN and MD discussed patient's elevated blood pressure. MD gave order to give blood pressure medications based off arterial line pressure.

## 2023-06-29 NOTE — Anesthesia Postprocedure Evaluation (Signed)
 Anesthesia Post Note  Patient: Jade Cardenas  Procedure(s) Performed: ATTEMPTED XI ROBOTIC ASSISTED HIATAL HERNIA REPAIR WITH FUNDOPLICATION AND MESH (Abdomen) LAPAROTOMY, EXPLORATORY W/LIGATION SPLENIC ARTERY AND GASTRIC TUBE PLACEMENT (Abdomen)     Patient location during evaluation: SICU Anesthesia Type: General Level of consciousness: sedated Pain management: pain level controlled Vital Signs Assessment: post-procedure vital signs reviewed and stable Respiratory status: patient remains intubated per anesthesia plan Cardiovascular status: stable Postop Assessment: no apparent nausea or vomiting Anesthetic complications: yes Comments: Unexpected ICU admission following ~2.5L EBL, massive transfusion of blood products.  Patient stable in ICU on propofol  infusion, no pressors.  Verbal report to ICU physician by MDA.   Encounter Notable Events  Notable Event Outcome Phase Comment  Difficult to intubate - unexpected  Intraprocedure Filed from anesthesia note documentation.    Last Vitals:  Vitals:   06/29/23 0547 06/29/23 1114  BP: (!) 174/76 (!) 182/81  Pulse: 85 78  Resp: 18 16  Temp: 36.8 C (!) 36.4 C  SpO2: 96%     Last Pain:  Vitals:   06/29/23 1114  TempSrc: Oral  PainSc:                  Erin Havers

## 2023-06-29 NOTE — Progress Notes (Signed)
 Arterial line blood pressure reading 190/83 (128), while R arm blood pressure cuff reading 147/68 (88). NP, Steve Minor, notified with care order to hold off on antihypertensive unless BP cuff reads >150.  Will continue to monitor. Jade Cardenas

## 2023-06-29 NOTE — Op Note (Addendum)
 06/29/2023  10:33 AM  PATIENT:  Jade Cardenas  73 y.o. female  PRE-OPERATIVE DIAGNOSIS:  HIATAL HERNIA  POST-OPERATIVE DIAGNOSIS:  HIATAL HERNIA  PROCEDURE:  Procedure(s) with comments: ATTEMPTED XI ROBOTIC ASSISTED HIATAL HERNIA REPAIR WITH FUNDOPLICATION AND MESH (N/A)  LAPAROTOMY, EXPLORATORY W/LIGATION SPLENIC ARTERY AND GASTRIC TUBE PLACEMENT (N/A)  SURGEON:  Surgeons and Role:    * Shela Derby, MD - Primary    * Adalberto Acton, MD - Assisting who was essential in helping with retraction, hemostasis    * Davonna Estes, Willeen Harold, MD - Assisting  ASSISTANTS: Woodroe Hazel, RNFA  ANESTHESIA:   local and general  EBL:  1500 mL   BLOOD ADMINISTERED: Per anesthesia record  DRAINS: 16 French gastric tube  LOCAL MEDICATIONS USED:  BUPIVICAINE  and OTHER Exparel   SPECIMEN:  No Specimen  DISPOSITION OF SPECIMEN:  N/A  COUNTS:  YES  TOURNIQUET:  * No tourniquets in log *  COMPLICATIONS: Patient had some significant bleeding in the midportion of the case after the stomach was reduced and the hiatus was dissected out.  This required a laparotomy.  This also required chest compressions and pressors.  It appeared that the splenic artery/vein had been avulsed.  This was ligated x 2.  Patient stayed intubated postoperatively and was taken to the ICU.  DICTATION: [employer] Dictation  Indication for procedure: Patient is a 73 year old female who comes in secondary to Cameron's ulcers and chronic anemia. Patient had large hiatal hernia.  She was counseled.  She decided to proceed with robotic hiatal hernia repair and fundoplication with mesh.  Details of procedure:   Findings: Patient with large hiatal hernia, with approximately 90% of the stomach in the chest cavity.  The area of bleeding was seen to be the splenic artery and vein.  This was ligated with 0 silks.  A 16 French gastrotomy tube was placed in the stomach.   The patient was taken back to the operating room and  placed in the supine position with bilateral SCDs in place. The patient was prepped and draped in the usual sterile fashion. After appropriate antibiotics were confirmed a timeout was called and all facts were verified.   A Veress needle technique was used to insufflate the abdomen to 15 mm of mercury the paramedian stab incision. Subsequent to this an 8 mm trocar was introduced as was a 8 millimeter camera. At this time the subsequent robotic trochars x3, were then placed adjacent to this trocar approximately 8-10 cm away. Each trocar was inserted under direct visualization, there were total of 4 trochars. A 12mm trocar was placed in the midclavicular line.  A 0vicryl was placed to help with closure at the end of the case.The assistant trocar was then placed in the right lower quadrant under direct visualization. The Nathanson retractor was then visualized inserted into the abdomen and the incision just to the left of the falciform ligament. This was then placed to retract the liver appropriately. At this time the patient was positioned in reverse Trendelenburg.   At this time the robot patient cart was brought to the bedside and placed in good position and the arms were docked to the trochars appropriately. At this time I proceeded to incised the gastrohepatic ligament.  At this time I proceeded to mobilize the stomach inferiorly and visualize the right crus. The peritoneum over the right crus was incised and right crus was identified. I proceeded to dissect this inferiorly until the left crus was seen joining the  right crus. Once the right crus was adequately dissected we turned our to the left crus which was dissected away. This required traction of the stomach to the right side. Once this was visualized we then proceeded to circumferentially dissect the esophagus away from the surrounding tissue. The anterior and posterior vagus was seen along the esophagus at the GE junction.  These were both preserved  throughout the entire case.  At this time the phrenoesophageal fat pad was dissected away from the esophagus. There was a moderate-sized hiatal hernia seen. I mobilized the esophagus cephalad approximately 4-5 cm, clearing away the surrounding tissue. The anterior hernia sac was dissected away from the stomach and esophagus.  At this time we turned our attention to the greater curvature the stomach and the omentum was mobilized using the robotic vessel sealer. This was taken up to the greater curvature to the hiatus. This mobilized the entire greater curvature to allow mobilization and the wrap.  At this time I proceeded to remove the left-sided hernia sac.  This came off easily.  The right posterior fat pad was retracted to the left and .  A vessel sealer was used to transect this.  Upon doing so there was a large amount of bleeding.  I did try to obtain vascular control however there was significant bleeding.  After several attempts and with robotic suctioning I cannot visualize this area.  There was concern for patient's blood pressure.  At this time the robot was emergently undocked.  I proceeded with a laparotomy.  10.  Blade was used to make a upper midline incision.  Dissection was taken down the linea alba.  The fascia was incised using curved Mayo's.  The fascia was extended to the length of the skin incision.  At this time I was able to place my hand on the aorta.  This allowed me to occlude the aorta.  There was chest compressions ongoing at this point secondary to the loss of blood pressure and pulse per anesthesia.  I continued all pressure to allow them to catch up some with her blood pressure.  I was able to easily palpate a pulse.  At this time Dr. Jamse Mcgee scrubbed in to help with assessing the bleeding and help with hemostasis.  Dr. Davonna Estes also was able to scrub into help with retraction.  Upon visualizing the area of concern and bleeding it appeared that the splenic artery and vein were  avulsed or some branches were avulsed from that area.  This is very near the aorta.  At this time I was able to dissect behind the vessels.  0 silk was used and the area was suture-ligated both proximally and distally in that area.  This appeared to stop the bleeding.  A laparotomy pad was placed.  Upon recheck an area of the area was hemostatic.  There was no further bleeding from this area.  At this time I made decision to place a gastrotomy tube.    At this time I made a skin incision just to the left of the main incision.  A tonsil was used to create the tract.  The tube was fed through this tract.  A gastrotomy was made.  The tube was then placed into the gastrostomy site.  This was sutured with pursestring using a 2-0 silk.  It was was brought up tolerated again to the abdominal wall.  At this time there was some bleeding from the very edge of the liver.  I have proceeded  to place a 2-0 chromic in this area however there was continued bleeding in this area.  I then cauterized the area and placed Surgicel on the area.  Secondary to the likely low platelets per the TEG and anesthesia I decided to continue and closed the abdominal cavity.  This was closed using #1 PDS x 2.  Skin was closed with staples as were the trocar sites.  The flange of the gastrostomy tube was then secured using 2-0 Prolene's x 2.  Honeycomb dressing was placed over the midline wound.  A drain sponge was placed over the flange.  Patient was taken to the ICU intubated, in critical condition.   PLAN OF CARE: Admit to inpatient   PATIENT DISPOSITION:  ICU - intubated and critically ill.   Delay start of Pharmacological VTE agent (>24hrs) due to surgical blood loss or risk of bleeding: yes

## 2023-06-29 NOTE — Transfer of Care (Signed)
 Immediate Anesthesia Transfer of Care Note  Patient: Jade Cardenas  Procedure(s) Performed: ATTEMPTED XI ROBOTIC ASSISTED HIATAL HERNIA REPAIR WITH FUNDOPLICATION AND MESH (Abdomen) LAPAROTOMY, EXPLORATORY W/LIGATION SPLENIC ARTERY AND GASTRIC TUBE PLACEMENT (Abdomen)  Patient Location: ICU  Anesthesia Type:General  Level of Consciousness: sedated and Patient remains intubated per anesthesia plan  Airway & Oxygen Therapy: Patient remains intubated per anesthesia plan  Post-op Assessment: Report given to RN and Post -op Vital signs reviewed and stable  Post vital signs: stable  Last Vitals:  See ICU vital sign flow sheet  Last Pain:  Vitals:   06/29/23 0606  TempSrc:   PainSc: 7       Patients Stated Pain Goal: 4 (06/29/23 0606)  Complications:  Encounter Notable Events  Notable Event Outcome Phase Comment  Difficult to intubate - unexpected  Intraprocedure Filed from anesthesia note documentation.

## 2023-06-29 NOTE — Progress Notes (Signed)
 NP, Siegfried Dress Minor notified on cuff BP reading 157/66. Awaiting response and orders  Alric Asp

## 2023-06-29 NOTE — H&P (Addendum)
 History of Present Illness: Jade Cardenas is a 73 y.o. female who is seen today as an office consultation at the request of Dr. Nathalie Baize for evaluation of Follow-up .   Patient is a 73 year old female who follows back up today after EGD. Patient was previously seen for large hiatal hernia.  Patient did undergo EGD recently at [facility] which did find 10 cm hiatal hernia.  There was also what appeared to be Cameron's ulcers that were seen.  Patient does have a history of iron deficiency anemia and is currently getting iron transfusions.   Patient states that she feels a early satiety.  She has minimal dysphagia.  She does have reflux.  She is currently on Protonix  twice daily likely to the Cameron's ulcers.   She has had a previous cholecystectomy and hysterectomy in the past.           Review of Systems: A complete review of systems was obtained from the patient.  I have reviewed this information and discussed as appropriate with the patient.  See HPI as well for other ROS.   Review of Systems  Constitutional: Negative.   HENT: Negative.    Eyes: Negative.   Respiratory: Negative.    Cardiovascular: Negative.   Gastrointestinal:  Positive for heartburn and nausea.  Genitourinary: Negative.   Musculoskeletal: Negative.   Skin: Negative.   Neurological: Negative.   Endo/Heme/Allergies: Negative.   Psychiatric/Behavioral: Negative.          Medical History: Past Medical History         Past Medical History:  Diagnosis Date   Anemia     Arthritis     GERD (gastroesophageal reflux disease)     Hyperlipidemia     Hypertension            Problem List  There is no problem list on file for this patient.      Past Surgical History           Past Surgical History:  Procedure Laterality Date   TONSILLECTOMY   1958   CHOLECYSTECTOMY   1996   Back surgery       JOINT REPLACEMENT       LAMINOPLASTY POSTERIOR CERVICAL SPINE       REDUCTION MAMMAPLASTY            Allergies            Allergies  Allergen Reactions   Erythromycin Hives and Swelling      All Mycins; throat swells shut and she gets "really hot"   Iodinated Contrast Media Shortness Of Breath and Swelling      CT contrast; throat swelling, difficulty breathing.  In [place]  "many years ago."   Ace Inhibitors Cough   Codeine Vomiting and Nausea   Indomethacin Vomiting and Hives   Cephalexin Hives   Iodine  Rash   Nitrofurantoin  Other (See Comments)      dizziness        Medications Ordered Prior to Encounter             Current Outpatient Medications on File Prior to Visit  Medication Sig Dispense Refill   folic acid  (FOLVITE ) 1 MG tablet Take 1 mg by mouth once daily       losartan  (COZAAR ) 100 MG tablet Take 1 tablet by mouth once daily       lovastatin  (MEVACOR ) 20 MG tablet Take 1 tablet by mouth every evening       methotrexate  25 mg/mL injection INJECT  0.3ML INTO THE SKIN ONCE WEEKLY       pantoprazole  (PROTONIX ) 40 MG DR tablet Take 40 mg by mouth 2 (two) times daily       SIMPONI  50 mg/0.5 mL pen injector INJECT 50MG  UNDER THE SKIN ONCE MONTHLY       triamcinolone  0.5 % cream Apply 1 Application topically 3 (three) times daily       amLODIPine  (NORVASC ) 5 MG tablet Take 2.5 mg by mouth once daily        No current facility-administered medications on file prior to visit.        Family History           Family History  Problem Relation Age of Onset   Obesity Mother     High blood pressure (Hypertension) Mother     Diabetes Mother     Colon cancer Mother     Stroke Father     Hyperlipidemia (Elevated cholesterol) Father     Coronary Artery Disease (Blocked arteries around heart) Father     Colon cancer Father     Coronary Artery Disease (Blocked arteries around heart) Brother          Tobacco Use History  Social History         Tobacco Use  Smoking Status Never  Smokeless Tobacco Never        Social History  Social History           Socioeconomic History    Marital status: Widowed  Tobacco Use   Smoking status: Never   Smokeless tobacco: Never  Substance and Sexual Activity   Alcohol use: Never   Drug use: Never    Social Determinants of Health           Financial Resource Strain: Low Risk  (11/08/2022)    Received from [facility]    Overall Financial Resource Strain (CARDIA)     Difficulty of Paying Living Expenses: Not very hard  Food Insecurity: No Food Insecurity (11/08/2022)    Received from [facility]    Hunger Vital Sign     Worried About Running Out of Food in the Last Year: Never true     Ran Out of Food in the Last Year: Never true  Transportation Needs: No Transportation Needs (11/08/2022)    Received from [facility] - Transportation     Lack of Transportation (Medical): No     Lack of Transportation (Non-Medical): No  Physical Activity: Insufficiently Active (11/08/2022)    Received from [facility]    Exercise Vital Sign     Days of Exercise per Week: 7 days     Minutes of Exercise per Session: 20 min  Stress: Stress Concern Present (11/08/2022)    Received from [facility] - Occupational Stress Questionnaire     Feeling of Stress : To some extent  Social Connections: Moderately Integrated (11/08/2022)    Received from [facility]    Social Connection and Isolation Panel [NHANES]     Frequency of Communication with Friends and Family: Three times a week     Frequency of Social Gatherings with Friends and Family: More than three times a week     Attends Religious Services: 1 to 4 times per year     Active Member of [employer] or Organizations: Yes     Attends Banker Meetings: 1 to 4 times per year  Marital Status: Widowed  Housing Stability: Low Risk  (10/13/2022)    Received from [facility] Stability Vital Sign     What is your living situation today?: I have a steady place to live     Think about the place you live. Do you have problems  with any of the following? Choose all that apply:: None/None on this list        Objective:           Vitals:    11/25/22 0946  PainSc: 0-No pain     BP (!) 174/76   Pulse 85   Temp 98.3 F (36.8 C) (Oral)   Resp 18   Ht 5\' 5"  (1.651 m)   Wt 95.3 kg   LMP 03/02/1984   SpO2 96%   BMI 34.95 kg/m   Physical Exam Constitutional:      Appearance: Normal appearance.  HENT:     Head: Normocephalic and atraumatic.     Mouth/Throat:     Mouth: Mucous membranes are moist.     Pharynx: Oropharynx is clear.  Eyes:     General: No scleral icterus.    Pupils: Pupils are equal, round, and reactive to light.  Cardiovascular:     Rate and Rhythm: Normal rate and regular rhythm.     Pulses: Normal pulses.     Heart sounds: No murmur heard.    No friction rub. No gallop.  Pulmonary:     Effort: Pulmonary effort is normal. No respiratory distress.     Breath sounds: Normal breath sounds. No stridor.  Abdominal:     General: Abdomen is flat.  Musculoskeletal:        General: No swelling.  Skin:    General: Skin is warm.  Neurological:     General: No focal deficit present.     Mental Status: She is alert and oriented to person, place, and time. Mental status is at baseline.  Psychiatric:        Mood and Affect: Mood normal.        Thought Content: Thought content normal.        Judgment: Judgment normal.        Assessment and Plan:  Diagnoses and all orders for this visit:   Hiatal hernia   Chronic Cameron ulcer   Iron deficiency anemia due to chronic blood loss     Manesha Simison is a 73 y.o. female     We will proceed to the OR for a robotic hiatal hernia repair with mesh and fundoplication All risks and benefits were discussed with the patient, to generally include infection, bleeding, damage to surrounding structures, possible pneumothorax, and recurrence. Alternatives were offered and described.  All questions were answered and the patient voiced  understanding of the procedure and wishes to proceed at this point.

## 2023-06-29 NOTE — Consult Note (Signed)
 NAME:  TRACIE BROSE, MRN:  [id number], DOB:  06/23/1950, LOS: 0 ADMISSION DATE:  06/29/2023, CONSULTATION DATE: 06/29/2023 REFERRING MD: Surgery, CHIEF COMPLAINT: Status post hemorrhage with near arrest situation on ventilator  History of Present Illness:  73 year old female with a history of hiatal hernia for which she had repaired on 06/29/2023.  Unfortunately she had bleeding that required ligation of the splenic artery and transfusion of 5 units of packed cells.  She did receive a short episode of CPR.  She was intubated and left on the ventilator when transferred to the ICU.  Pulmonary critical care has been asked to evaluate and assist with her weaning.  Labs have been ordered chest x-rays have been ordered we will keep her sedated on the ventilator overnight and reassess in the a.m.  Pertinent  Medical History   Past Medical History:  Diagnosis Date   Anemia    iron def   Arthritis    rheumatoid, psoriatic, osteoarthritis    Bone spur    RIGHT FOOT    Broken toe    Bruises easily    Cellulitis    Chronic back pain    COLONIC POLYPS, HX OF 02/09/2007   Cough, persistent 01/21/2012   poss from acei    DES exposure in utero, unknown    Femur fracture, left (HCC)    Fibromyalgia    doesn't require meds   GASTRIC ULCER, ACUTE, HEMORRHAGE, HX OF 02/09/2007   GERD (gastroesophageal reflux disease)    takes Nexium  daily   Goiter    H/O hiatal hernia    H/O: hysterectomy    when 73 yrs old   Headache    Occasional migraines with increased stress   Hx of seasonal allergies    takes Claritin  daily   HYPERLIPIDEMIA 12/29/2006   takes Lovastatin  nightly   HYPERTENSION 12/29/2006   takes Enalapril  daily   IBS 04/28/2007   Insomnia    related to pain;takes Flexeril  and Tylenol  PM nightly   Joint pain    Joint swelling    Neuropathy    small cell   Osteopenia    Palpitations 02/09/2007   PONV (postoperative nausea and vomiting)    NAUSEA   REDUCTION MAMMOPLASTY, HX OF  02/09/2007   Right bundle branch block 02/09/2007   S/P lumbar fusion 6 13 09/03/2011   L4 L5  posterior    SYNCOPE 12/07/2007   Tendonitis    RLE    UNS ADVRS EFF OTH RX MEDICINAL\T\BIOLOGICAL SBSTNC 02/09/2007     Significant Hospital Events: Including procedures, antibiotic start and stop dates in addition to other pertinent events     Interim History / Subjective:  Status post hemorrhage episode in OR transferred to the ICU  Objective   Blood pressure (!) 182/81, pulse 78, temperature (!) 97.5 F (36.4 C), temperature source Oral, resp. rate 16, height 5\' 5"  (1.651 m), weight 95.3 kg, last menstrual period 03/02/1984, SpO2 96%.    Vent Mode: PRVC FiO2 (%):  [40 %] 40 % Set Rate:  [16 bmp] 16 bmp Vt Set:  [450 mL] 450 mL PEEP:  [5 cmH20] 5 cmH20 Plateau Pressure:  [19 cmH20] 19 cmH20   Intake/Output Summary (Last 24 hours) at 06/29/2023 1142 Last data filed at 06/29/2023 1000 Gross per 24 hour  Intake 5550 ml  Output 2500 ml  Net 3050 ml   Filed Weights   06/29/23 0547  Weight: 95.3 kg    Examination: General: Elderly female sedated on the ventilator  no acute distress HENT: Endotracheal tube is in place no JVD is appreciated Lungs: Diminished breath sounds in the bases Cardiovascular: Heart sounds are regular arterial blood pressure 177/77 Abdomen: Abdomen is obese soft gastric tube is in place surgical line is intact without obvious bleeding Extremities: 1+ edema Neuro: Needed on the vent GU: Amber urine  Resolved Hospital Problem list     Assessment & Plan:  Ventilator dependent respiratory failure in the setting of massive hemorrhage during hiatal hernia repair 06/29/2023 which required splenic artery ligation Continue ventilatory support for least 24 hours Check chest x-ray for completeness Arterial blood gas for completeness Wean as possible in the a.m. Propofol  and fentanyl  as needed  Status post hemorrhage with massive transfusion of 5 units of  packed red blood cells and ligation of splenic artery during hiatal hernia repair 06/29/2023 No results for input(s): "HGB" in the last 72 hours. Transfuse as needed Serial CBCs Monitor for hemorrhage Questionable of gastric tube needs to be to low intermittent wall suction This will be decided per surgery No anticoagulation at this time   Best Practice (right click and "Reselect all SmartList Selections" daily)   Diet/type: NPO DVT prophylaxis not indicated Pressure ulcer(s): N/A GI prophylaxis: PPI Lines: Central line Foley:  Yes, and it is still needed Code Status:  full code Last date of multidisciplinary goals of care discussion [TBA]  Labs   CBC: No results for input(s): "WBC", "NEUTROABS", "HGB", "HCT", "MCV", "PLT" in the last 168 hours.  Basic Metabolic Panel: No results for input(s): "NA", "K", "CL", "CO2", "GLUCOSE", "BUN", "CREATININE", "CALCIUM", "MG", "PHOS" in the last 168 hours. GFR: Estimated Creatinine Clearance: 67.7 mL/min (by C-G formula based on SCr of 0.87 mg/dL). No results for input(s): "PROCALCITON", "WBC", "LATICACIDVEN" in the last 168 hours.  Liver Function Tests: No results for input(s): "AST", "ALT", "ALKPHOS", "BILITOT", "PROT", "ALBUMIN" in the last 168 hours. No results for input(s): "LIPASE", "AMYLASE" in the last 168 hours. No results for input(s): "AMMONIA" in the last 168 hours.  ABG    Component Value Date/Time   TCO2 25 12/03/2007 1816     Coagulation Profile: No results for input(s): "INR", "PROTIME" in the last 168 hours.  Cardiac Enzymes: No results for input(s): "CKTOTAL", "CKMB", "CKMBINDEX", "TROPONINI" in the last 168 hours.  HbA1C: Hgb A1c MFr Bld  Date/Time Value Ref Range Status  05/03/2023 09:43 AM 5.8 4.6 - 6.5 % Final    Comment:    Glycemic Control Guidelines for People with Diabetes:Non Diabetic:  <6%Goal of Therapy: <7%Additional Action Suggested:  >8%   11/04/2022 09:45 AM 5.6 4.6 - 6.5 % Final    Comment:     Glycemic Control Guidelines for People with Diabetes:Non Diabetic:  <6%Goal of Therapy: <7%Additional Action Suggested:  >8%     CBG: No results for input(s): "GLUCAP" in the last 168 hours.  Review of Systems:   na  Past Medical History:  She,  has a past medical history of Anemia, Arthritis, Bone spur, Broken toe, Bruises easily, Cellulitis, Chronic back pain, COLONIC POLYPS, HX OF (02/09/2007), Cough, persistent (01/21/2012), DES exposure in utero, unknown, Femur fracture, left (HCC), Fibromyalgia, GASTRIC ULCER, ACUTE, HEMORRHAGE, HX OF (02/09/2007), GERD (gastroesophageal reflux disease), Goiter, H/O hiatal hernia, H/O: hysterectomy, Headache, seasonal allergies, HYPERLIPIDEMIA (12/29/2006), HYPERTENSION (12/29/2006), IBS (04/28/2007), Insomnia, Joint pain, Joint swelling, Neuropathy, Osteopenia, Palpitations (02/09/2007), PONV (postoperative nausea and vomiting), REDUCTION MAMMOPLASTY, HX OF (02/09/2007), Right bundle branch block (02/09/2007), S/P lumbar fusion 6 13 (09/03/2011), SYNCOPE (12/07/2007), Tendonitis, and  UNS ADVRS EFF OTH RX MEDICINAL\T\BIOLOGICAL SBSTNC (02/09/2007).   Surgical History:   Past Surgical History:  Procedure Laterality Date   BACK SURGERY  12+yrs ago   Synovial Cyst removal   BACK SURGERY  08/2011   lumbar L4-L5 fusion, lamenectomy   bone spur  6-58yrs ago   right ankle   BUNIONECTOMY  10/03/2014   right foot   BUNIONECTOMY Left    CERVICAL FUSION     2 12    CHOLECYSTECTOMY  1996   HARDWARE REMOVAL Left 08/01/2018   Procedure: Left knee hardware removal;  Surgeon: Liliane Rei, MD;  Location: [facility];  Service: Orthopedics;  Laterality: Left;    POSTERIOR CERVICAL LAMINECTOMY Left 07/30/2015   Procedure: Laminectomy and Foraminotomy - left - Cervical two -Cervical three;  Surgeon: Agustina Aldrich, MD;  Location: [facility];  Service: Neurosurgery;  Laterality: Left;   REDUCTION MAMMAPLASTY Bilateral 1989   TONSILLECTOMY     as a child    TOTAL ABDOMINAL HYSTERECTOMY W/ BILATERAL SALPINGOOPHORECTOMY  1986   TOTAL KNEE ARTHROPLASTY N/A 06/21/2017   Procedure: LEFT TOTAL KNEE ARTHROPLASTY AND RIGHT KNEE CORTISONE INJECTION;  Surgeon: Liliane Rei, MD;  Location: [facility];  Service: Orthopedics;  Laterality: N/A;     Social History:   reports that she has never smoked. She has never been exposed to tobacco smoke. She has never used smokeless tobacco. She reports that she does not drink alcohol and does not use drugs.   Family History:  Her family history includes Arthritis in her brother and mother; Colon cancer in her father and mother; Diabetes in her brother and mother; Endometrial cancer in her mother; Heart disease in her father; Hyperlipidemia in her brother; Hypertension in her brother, maternal grandmother, and mother; Infertility in her brother; Other in an other family member; Stroke in her father and maternal grandmother. There is no history of Anesthesia problems, Hypotension, Malignant hyperthermia, or Pseudochol deficiency.   Allergies Allergies  Allergen Reactions   Erythromycin Hives, Swelling and Other (See Comments)    All Mycins; throat swells shut and she gets "really hot"   Iodinated Contrast Media Shortness Of Breath and Swelling    CT contrast; throat swelling, difficulty breathing.  In [place]  "many years ago."   Ace Inhibitors Cough   Indomethacin Other (See Comments)   Keflex [Cephalexin] Hives   Macrobid  [Nitrofurantoin ] Other (See Comments)    dizziness     Home Medications  Prior to Admission medications   Medication Sig Start Date End Date Taking? Authorizing Provider  acetaminophen  (TYLENOL ) 650 MG CR tablet Take 1,300 mg by mouth See admin instructions. Take 1300 mg in the morning and may take an additional 1300 mg dose as needed for pain   Yes [provider]  Calcium Carb-Cholecalciferol  (CALCIUM+D3 PO) Take 1 tablet by mouth daily.   Yes [provider]   carboxymethylcellulose (REFRESH PLUS) 0.5 % SOLN Place 1 drop into both eyes as needed (dry eyes).   Yes [provider]  diphenhydramine -acetaminophen  (TYLENOL  PM) 25-500 MG TABS tablet Take 1 tablet by mouth at bedtime.   Yes [provider]  folic acid  (FOLVITE ) 1 MG tablet Take 1 tablet (1 mg total) by mouth daily. 05/13/23  Yes Romayne Clubs, PA-C  losartan  (COZAAR ) 100 MG tablet Take 1 tablet (100 mg total) by mouth daily. 11/12/22  Yes Panosh, Wanda K, MD  lovastatin  (MEVACOR ) 20 MG tablet Take 1 tablet (20 mg total) by mouth every evening. 11/12/22  Yes Panosh, Wanda K, MD  methotrexate  50 MG/2ML injection INJECT 0.3ML INTO THE SKIN ONCE WEEKLY 03/01/23  Yes Romayne Clubs, PA-C  Multiple Vitamins-Minerals (MULTIVITAMIN ADULTS PO) Take 1 tablet by mouth daily.    Yes [provider]  pantoprazole  (PROTONIX ) 40 MG tablet Take 40 mg by mouth 2 (two) times daily. 09/23/22  Yes [provider]  B-D ALLERGY  SYRINGE 1CC/28G 28G X 1/2" 1 ML MISC USE AS DIRECTED ONCE WEEKLY 06/17/21   Romayne Clubs, PA-C  SIMPONI  50 MG/0.5ML SOAJ INJECT 50MG  UNDER THE SKIN ONCE MONTHLY 04/30/23   Nicholas Bari, MD     Critical care time: 45 min    Siegfried Dress Quill Grinder ACNP Acute Care Nurse Practitioner [facility] Pulmonary/Critical Care Please consult Amion 06/29/2023, 11:42 AM

## 2023-06-29 NOTE — Anesthesia Procedure Notes (Signed)
 Procedure Name: Intubation Date/Time: 06/29/2023 8:51 AM  Performed by: Mirabelle Cyphers, CRNAPre-anesthesia Checklist: Patient identified, Emergency Drugs available, Suction available, Patient being monitored and Timeout performed Patient Re-evaluated:Patient Re-evaluated prior to induction Oxygen Delivery Method: Circle system utilized Preoxygenation: Pre-oxygenation with 100% oxygen Induction Type: IV induction Ventilation: Mask ventilation without difficulty Laryngoscope Size: Glidescope Grade View: Grade I Tube type: Oral Tube size: 7.0 mm Number of attempts: 1 Airway Equipment and Method: Patient positioned with wedge pillow and Rigid stylet Placement Confirmation: ETT inserted through vocal cords under direct vision, positive ETCO2 and breath sounds checked- equal and bilateral Secured at: 21 cm Tube secured with: Tape Dental Injury: Teeth and Oropharynx as per pre-operative assessment  Difficulty Due To: Difficulty was unanticipated Future Recommendations: Recommend- induction with short-acting agent, and alternative techniques readily available

## 2023-06-29 NOTE — Progress Notes (Signed)
 Minor, NP gave order to give cryo and not platelets.

## 2023-06-29 NOTE — Progress Notes (Signed)
 eLink Physician-Brief Progress Note Patient Name: Jade Cardenas DOB: 1951/01/01 MRN: [id number]   Date of Service  06/29/2023  HPI/Events of Note  RN reports pt has colace ordered, but surgical G-tube is to gravity tonight.    eICU Interventions  Hold colace tonight      Intervention Category Major Interventions: Other:  [name] [name] 06/29/2023, 10:11 PM  Addendum at 12:15 am - Called for lower Bps after patient was bathed. Corrected without intervention. MAPs lower because DBP lower (not new). But SBP is in 120s on art line. Will also add maintenance LR given post op status.

## 2023-06-29 NOTE — Plan of Care (Signed)
  Problem: Clinical Measurements: Goal: Ability to maintain clinical measurements within normal limits will improve Outcome: Progressing Goal: Will remain free from infection Outcome: Progressing Goal: Diagnostic test results will improve Outcome: Progressing Goal: Respiratory complications will improve Outcome: Progressing Goal: Cardiovascular complication will be avoided Outcome: Progressing   Problem: Elimination: Goal: Will not experience complications related to urinary retention Outcome: Progressing   Problem: Safety: Goal: Ability to remain free from injury will improve Outcome: Progressing   Problem: Skin Integrity: Goal: Risk for impaired skin integrity will decrease Outcome: Progressing   Problem: Skin Integrity: Goal: Risk for impaired skin integrity will decrease Outcome: Progressing   Problem: Tissue Perfusion: Goal: Adequacy of tissue perfusion will improve Outcome: Progressing

## 2023-06-29 NOTE — Anesthesia Procedure Notes (Addendum)
 Central Venous Catheter Insertion Performed by: Erin Havers, MD, anesthesiologist Start/End4/29/2025 9:27 AM, 06/29/2023 9:37 AM Patient location: OR. Emergency situation Preanesthetic checklist: patient identified, IV checked and monitors and equipment checked Position: Trendelenburg Hand hygiene performed  and Seldinger technique used Catheter size: 9 Fr Central line was placed.MAC introducer Procedure performed using ultrasound guided technique. Ultrasound Notes:anatomy identified, needle tip was noted to be adjacent to the nerve/plexus identified, no ultrasound evidence of intravascular and/or intraneural injection and image(s) printed for medical record Attempts: 1 Following insertion, line sutured, dressing applied and Biopatch. Post procedure assessment: free fluid flow, blood return through all ports and no air  Patient tolerated the procedure well with no immediate complications.

## 2023-06-29 NOTE — Anesthesia Procedure Notes (Addendum)
 Arterial Line Insertion Start/End4/29/2025 9:17 AM, 06/29/2023 9:27 AM Performed by: Erin Havers, MD, anesthesiologist  Patient location: OR. Preanesthetic checklist: patient identified, IV checked and monitors and equipment checked Emergency situation Left, brachial was placed Catheter size: 20 G Hand hygiene performed   Attempts: 2 Procedure performed using ultrasound guided technique. Ultrasound Notes:anatomy identified, needle tip was noted to be adjacent to the nerve/plexus identified, no ultrasound evidence of intravascular and/or intraneural injection and image(s) printed for medical record Following insertion, dressing applied and Biopatch. Post procedure assessment: normal and unchanged  Post procedure complications: unsuccessful attempts. Patient tolerated the procedure well with no immediate complications.

## 2023-06-29 NOTE — Progress Notes (Signed)
 Patient ID: Jade Cardenas, female   DOB: [birth year], 73 y.o.   MRN: [id number] Post op note  Patient doing well.  Off pressors.  Continues on the ventilator.  Making good urine.  Called patient's sister-in-law to discuss and update them. Appreciate CCM help with ventilator.

## 2023-06-30 ENCOUNTER — Encounter (HOSPITAL_COMMUNITY): Payer: Self-pay | Admitting: General Surgery

## 2023-06-30 DIAGNOSIS — K449 Diaphragmatic hernia without obstruction or gangrene: Secondary | ICD-10-CM | POA: Diagnosis not present

## 2023-06-30 DIAGNOSIS — R571 Hypovolemic shock: Secondary | ICD-10-CM | POA: Diagnosis not present

## 2023-06-30 DIAGNOSIS — J9600 Acute respiratory failure, unspecified whether with hypoxia or hypercapnia: Secondary | ICD-10-CM | POA: Diagnosis not present

## 2023-06-30 LAB — TYPE AND SCREEN
ABO/RH(D): B POS
Antibody Screen: NEGATIVE
Unit division: 0
Unit division: 0
Unit division: 0
Unit division: 0
Unit division: 0
Unit division: 0
Unit division: 0
Unit division: 0
Unit division: 0
Unit division: 0
Unit division: 0
Unit division: 0
Unit division: 0
Unit division: 0
Unit division: 0
Unit division: 0

## 2023-06-30 LAB — BPAM CRYOPRECIPITATE
Blood Product Expiration Date: 202504292359
ISSUE DATE / TIME: 202504291459
Unit Type and Rh: 5100

## 2023-06-30 LAB — POCT I-STAT 7, (LYTES, BLD GAS, ICA,H+H)
Acid-base deficit: 11 mmol/L — ABNORMAL HIGH (ref 0.0–2.0)
Acid-base deficit: 15 mmol/L — ABNORMAL HIGH (ref 0.0–2.0)
Acid-base deficit: 5 mmol/L — ABNORMAL HIGH (ref 0.0–2.0)
Bicarbonate: 13.8 mmol/L — ABNORMAL LOW (ref 20.0–28.0)
Bicarbonate: 17.6 mmol/L — ABNORMAL LOW (ref 20.0–28.0)
Bicarbonate: 20.3 mmol/L (ref 20.0–28.0)
Calcium, Ion: 0.53 mmol/L — CL (ref 1.15–1.40)
Calcium, Ion: 0.62 mmol/L — CL (ref 1.15–1.40)
Calcium, Ion: 1.13 mmol/L — ABNORMAL LOW (ref 1.15–1.40)
HCT: 20 % — ABNORMAL LOW (ref 36.0–46.0)
HCT: 23 % — ABNORMAL LOW (ref 36.0–46.0)
HCT: 23 % — ABNORMAL LOW (ref 36.0–46.0)
Hemoglobin: 6.8 g/dL — CL (ref 12.0–15.0)
Hemoglobin: 7.8 g/dL — ABNORMAL LOW (ref 12.0–15.0)
Hemoglobin: 7.8 g/dL — ABNORMAL LOW (ref 12.0–15.0)
O2 Saturation: 100 %
O2 Saturation: 100 %
O2 Saturation: 100 %
Potassium: 3.7 mmol/L (ref 3.5–5.1)
Potassium: 4.6 mmol/L (ref 3.5–5.1)
Potassium: 5.1 mmol/L (ref 3.5–5.1)
Sodium: 142 mmol/L (ref 135–145)
Sodium: 142 mmol/L (ref 135–145)
Sodium: 143 mmol/L (ref 135–145)
TCO2: 15 mmol/L — ABNORMAL LOW (ref 22–32)
TCO2: 19 mmol/L — ABNORMAL LOW (ref 22–32)
TCO2: 21 mmol/L — ABNORMAL LOW (ref 22–32)
pCO2 arterial: 36.9 mmHg (ref 32–48)
pCO2 arterial: 49.5 mmHg — ABNORMAL HIGH (ref 32–48)
pCO2 arterial: 50.5 mmHg — ABNORMAL HIGH (ref 32–48)
pH, Arterial: 7.053 — CL (ref 7.35–7.45)
pH, Arterial: 7.151 — CL (ref 7.35–7.45)
pH, Arterial: 7.348 — ABNORMAL LOW (ref 7.35–7.45)
pO2, Arterial: 420 mmHg — ABNORMAL HIGH (ref 83–108)
pO2, Arterial: 486 mmHg — ABNORMAL HIGH (ref 83–108)
pO2, Arterial: 494 mmHg — ABNORMAL HIGH (ref 83–108)

## 2023-06-30 LAB — BPAM RBC
Blood Product Expiration Date: 202505212359
Blood Product Expiration Date: 202505252359
Blood Product Expiration Date: 202505252359
Blood Product Expiration Date: 202505252359
Blood Product Expiration Date: 202505252359
Blood Product Unit Number: 202505142359
Blood Product Unit Number: 202505152359
Blood Product Unit Number: 202505152359
Blood Product Unit Number: 202505212359
Blood Product Unit Number: 202505222359
Blood Product Unit Number: 202505252359
Blood Product Unit Number: 202505262359
ISSUE DATE / TIME: 202504290914
ISSUE DATE / TIME: 202504290914
ISSUE DATE / TIME: 202504290914
ISSUE DATE / TIME: 202504290914
ISSUE DATE / TIME: 202504290924
ISSUE DATE / TIME: 202504290924
ISSUE DATE / TIME: 202504290924
ISSUE DATE / TIME: 202504290940
ISSUE DATE / TIME: 202504291825
ISSUE DATE / TIME: 202504300149
ISSUE DATE / TIME: 202504300149
ISSUE DATE / TIME: 202505152359
ISSUE DATE / TIME: 202505212359
ISSUE DATE / TIME: 202505212359
ISSUE DATE / TIME: 202505252359
ISSUE DATE / TIME: 202505262359
PRODUCT CODE: 202504290924
PRODUCT CODE: 202504290940
PRODUCT CODE: 202504300149
PRODUCT CODE: 202505152359
PRODUCT CODE: 202505212359
PRODUCT CODE: 202505222359
PRODUCT CODE: 202505252359
Unit Type and Rh: 202504290924
Unit Type and Rh: 202504290924
Unit Type and Rh: 202504290924
Unit Type and Rh: 202504290924
Unit Type and Rh: 202504290940
Unit Type and Rh: 202504290940
Unit Type and Rh: 202504300149
Unit Type and Rh: 202505212359
Unit Type and Rh: 202505212359
Unit Type and Rh: 202505252359
Unit Type and Rh: 202505262359
Unit Type and Rh: 202505262359
Unit Type and Rh: 5100
Unit Type and Rh: 5100
Unit Type and Rh: 5100
Unit Type and Rh: 5100
Unit Type and Rh: 5100
Unit Type and Rh: 5100
Unit Type and Rh: 5100
Unit Type and Rh: 5100
Unit Type and Rh: 5100
Unit Type and Rh: 5100
Unit Type and Rh: 5100
Unit Type and Rh: 5100
Unit Type and Rh: 5100
Unit Type and Rh: 5100

## 2023-06-30 LAB — GLUCOSE, CAPILLARY
Glucose-Capillary: 125 mg/dL — ABNORMAL HIGH (ref 70–99)
Glucose-Capillary: 137 mg/dL — ABNORMAL HIGH (ref 70–99)
Glucose-Capillary: 55 mg/dL — ABNORMAL LOW (ref 70–99)
Glucose-Capillary: 80 mg/dL (ref 70–99)
Glucose-Capillary: 84 mg/dL (ref 70–99)
Glucose-Capillary: 85 mg/dL (ref 70–99)
Glucose-Capillary: 93 mg/dL (ref 70–99)

## 2023-06-30 LAB — PREPARE FRESH FROZEN PLASMA
Unit division: 0
Unit division: 0
Unit division: 0
Unit division: 0
Unit division: 0
Unit division: 0
Unit division: 0
Unit division: 0
Unit division: 0
Unit division: 0

## 2023-06-30 LAB — COMPREHENSIVE METABOLIC PANEL WITH GFR
ALT: 242 U/L — ABNORMAL HIGH (ref 0–44)
AST: 214 U/L — ABNORMAL HIGH (ref 15–41)
Albumin: 3.3 g/dL — ABNORMAL LOW (ref 3.5–5.0)
Alkaline Phosphatase: 33 U/L — ABNORMAL LOW (ref 38–126)
Anion gap: 10 (ref 5–15)
BUN: 19 mg/dL (ref 8–23)
CO2: 24 mmol/L (ref 22–32)
Calcium: 8.4 mg/dL — ABNORMAL LOW (ref 8.9–10.3)
Chloride: 107 mmol/L (ref 98–111)
Creatinine, Ser: 0.88 mg/dL (ref 0.44–1.00)
GFR, Estimated: >60 mL/min (ref >60)
Glucose, Bld: 114 mg/dL — ABNORMAL HIGH (ref 70–99)
Potassium: 3.8 mmol/L (ref 3.5–5.1)
Sodium: 141 mmol/L (ref 135–145)
Total Bilirubin: 1.8 mg/dL — ABNORMAL HIGH (ref 0.0–1.2)
Total Protein: 5 g/dL — ABNORMAL LOW (ref 6.5–8.1)

## 2023-06-30 LAB — PREPARE PLATELET PHERESIS
Unit division: 0
Unit division: 0

## 2023-06-30 LAB — BPAM PLATELET PHERESIS
Blood Product Expiration Date: 202505022359
Blood Product Unit Number: 202504302359
ISSUE DATE / TIME: 202504291727
PRODUCT CODE: 202504300226
PRODUCT CODE: 202505022359
Unit Type and Rh: 7300
Unit Type and Rh: 202504302359
Unit Type and Rh: 5100
Unit Type and Rh: 7300

## 2023-06-30 LAB — BPAM FFP
Blood Product Expiration Date: 202505032359
Blood Product Expiration Date: 202505032359
Blood Product Expiration Date: 202505032359
Blood Product Expiration Date: 202505032359
Blood Product Expiration Date: 202505032359
Blood Product Expiration Date: 202505122359
Blood Product Expiration Date: 202505122359
Blood Product Unit Number: 202505122359
Blood Product Unit Number: 202505122359
ISSUE DATE / TIME: 202504290917
ISSUE DATE / TIME: 202504290917
ISSUE DATE / TIME: 202504290917
ISSUE DATE / TIME: 202504290917
ISSUE DATE / TIME: 202504290924
ISSUE DATE / TIME: 202504291021
ISSUE DATE / TIME: 202504291426
ISSUE DATE / TIME: 202504300149
ISSUE DATE / TIME: 202504300149
ISSUE DATE / TIME: 202505122359
ISSUE DATE / TIME: 202505172359
PRODUCT CODE: 202504300149
PRODUCT CODE: 202505122359
Unit Type and Rh: 202504290924
Unit Type and Rh: 202504300149
Unit Type and Rh: 202505122359
Unit Type and Rh: 202505172359
Unit Type and Rh: 6200
Unit Type and Rh: 6200
Unit Type and Rh: 6200
Unit Type and Rh: 6200
Unit Type and Rh: 6200
Unit Type and Rh: 6200
Unit Type and Rh: 6200
Unit Type and Rh: 6200
Unit Type and Rh: 6200
Unit Type and Rh: 6200

## 2023-06-30 LAB — PREPARE CRYOPRECIPITATE: Unit division: 0

## 2023-06-30 LAB — CBC
HCT: 25.5 % — ABNORMAL LOW (ref 36.0–46.0)
Hemoglobin: 9.2 g/dL — ABNORMAL LOW (ref 12.0–15.0)
MCH: 29.8 pg (ref 26.0–34.0)
MCHC: 36.1 g/dL — ABNORMAL HIGH (ref 30.0–36.0)
MCV: 82.5 fL (ref 80.0–100.0)
Platelets: 122 10*3/uL — ABNORMAL LOW (ref 150–400)
RBC: 3.09 MIL/uL — ABNORMAL LOW (ref 3.87–5.11)
RDW: 16 % — ABNORMAL HIGH (ref 11.5–15.5)
WBC: 10.3 10*3/uL (ref 4.0–10.5)
nRBC: 0 % (ref 0.0–0.2)

## 2023-06-30 LAB — TRIGLYCERIDES: Triglycerides: 140 mg/dL (ref <150)

## 2023-06-30 MED ORDER — LACTATED RINGERS IV SOLN: INTRAVENOUS | Status: DC

## 2023-06-30 MED ORDER — DOCUSATE SODIUM 100 MG PO CAPS
100.0000 mg | ORAL_CAPSULE | Freq: Two times a day (BID) | ORAL | Status: DC
  Administered 2023-06-30 – 2023-07-06 (×11): 100 mg via ORAL
  Filled 2023-06-30 (×15): qty 1

## 2023-06-30 MED ORDER — PANTOPRAZOLE SODIUM 40 MG PO TBEC
40.0000 mg | DELAYED_RELEASE_TABLET | Freq: Two times a day (BID) | ORAL | Status: DC
  Administered 2023-06-30 – 2023-07-06 (×12): 40 mg via ORAL
  Filled 2023-06-30 (×12): qty 1

## 2023-06-30 MED ORDER — HEPARIN SODIUM (PORCINE) 5000 UNIT/ML IJ SOLN
5000.0000 [IU] | Freq: Three times a day (TID) | INTRAMUSCULAR | Status: DC
  Administered 2023-06-30 – 2023-07-01 (×3): 5000 [IU] via SUBCUTANEOUS
  Filled 2023-06-30 (×3): qty 1

## 2023-06-30 MED ORDER — HYDROMORPHONE HCL 1 MG/ML IJ SOLN
1.0000 mg | INTRAMUSCULAR | Status: DC | PRN
  Administered 2023-07-01 – 2023-07-03 (×7): 1 mg via INTRAVENOUS
  Filled 2023-06-30 (×4): qty 1

## 2023-06-30 MED ORDER — CIPROFLOXACIN HCL 500 MG PO TABS
500.0000 mg | ORAL_TABLET | Freq: Two times a day (BID) | ORAL | Status: AC
  Administered 2023-06-30 – 2023-07-02 (×6): 500 mg via ORAL
  Filled 2023-06-30 (×7): qty 1

## 2023-06-30 MED ORDER — PANTOPRAZOLE SODIUM 40 MG IV SOLR
40.0000 mg | INTRAVENOUS | Status: DC
  Administered 2023-06-30: 40 mg via INTRAVENOUS
  Filled 2023-06-30: qty 10

## 2023-06-30 MED ORDER — ACETAMINOPHEN 325 MG PO TABS
650.0000 mg | ORAL_TABLET | Freq: Four times a day (QID) | ORAL | Status: DC
  Administered 2023-06-30 – 2023-07-03 (×12): 650 mg via ORAL
  Filled 2023-06-30 (×13): qty 2

## 2023-06-30 MED ORDER — POLYETHYLENE GLYCOL 3350 17 G PO PACK
17.0000 g | PACK | Freq: Every day | ORAL | Status: DC
  Administered 2023-07-01 – 2023-07-04 (×4): 17 g via ORAL
  Filled 2023-06-30 (×4): qty 1

## 2023-06-30 MED ORDER — OXYCODONE HCL 5 MG PO TABS
5.0000 mg | ORAL_TABLET | Freq: Four times a day (QID) | ORAL | Status: DC | PRN
  Administered 2023-06-30 – 2023-07-03 (×4): 5 mg via ORAL
  Filled 2023-06-30 (×4): qty 1

## 2023-06-30 MED FILL — Heparin Sodium (Porcine) Inj 1000 Unit/ML: INTRAMUSCULAR | Qty: 30 | Status: AC

## 2023-06-30 NOTE — Progress Notes (Signed)
 Patient arrived to 6N 32. ANO X4. Skin assessment performed with Jasmin, RN abdominal dressings noted to have old drainage per ICU RN Bearl Limes surgery team will be managing dressing changes. Current pain score a 7/10 pain medicine given prior to transport. Bed put in lowest position and call light placed within reach.

## 2023-06-30 NOTE — Progress Notes (Signed)
 MD Melton Squires notified about a-line bp trending 70s-90s SBP. Cuff pressure 94/41. No concerns expressed, continue to monitor per MD Melton Squires. Proceed with CVL and A-line removal prior to transfer.

## 2023-06-30 NOTE — Progress Notes (Signed)
 MEWS Progress Note  Patient Details Name: Jade Cardenas MRN: [id number] DOB: [birth year] Today's Date: 06/30/2023   MEWS Flowsheet Documentation:  Assess: MEWS Score Temp: 99 F (37.2 C) BP: (!) 115/50 MAP (mmHg): 65 Pulse Rate: (!) 113 ECG Heart Rate: (!) 106 Resp: 16 Level of Consciousness: Alert SpO2: 92 % O2 Device: Room Air FiO2 (%): 40 % Assess: MEWS Score MEWS Temp: 0 MEWS Systolic: 0 MEWS Pulse: 2 MEWS RR: 0 MEWS LOC: 0 MEWS Score: 2 MEWS Score Color: Yellow Assess: SIRS CRITERIA SIRS Temperature : 0 SIRS Respirations : 0 SIRS Pulse: 1 SIRS WBC: 0 SIRS Score Sum : 1 Assess: if the MEWS score is Yellow or Red Were vital signs accurate and taken at a resting state?: Yes Does the patient meet 2 or more of the SIRS criteria?: No MEWS guidelines implemented : Yes, yellow Treat MEWS Interventions: Considered administering scheduled or prn medications/treatments as ordered Take Vital Signs Increase Vital Sign Frequency : Yellow: Q2hr x1, continue Q4hrs until patient remains green for 12hrs Escalate MEWS: Escalate: Yellow: Discuss with charge nurse and consider notifying provider and/or RRT Notify: Charge Nurse/RN Name of Charge Nurse/RN Notified: Aleda Hurl, RN Provider Notification Provider Name/Title: Shela Derby, MD Date Provider Notified: 06/30/23 Time Provider Notified: 1640 Method of Notification: Page Notification Reason: Other (Comment) (Yellow MEWS) Provider response: Other (Comment) (Awaiting provider response)      [name] 06/30/2023, 4:40 PM

## 2023-06-30 NOTE — TOC Initial Note (Signed)
 Transition of Care [facility]) - Initial/Assessment Note    Patient Details  Name: Jade Cardenas MRN: [id number] Date of Birth: 07/26/1950  Transition of Care [facility]) CM/SW Contact:    Juliane Och, LCSW Phone Number: 06/30/2023, 11:45 AM  Clinical Narrative:                  11:45 AM CSW introduced self and role to patient at bedside. Patient's three friends were also present at bedside. Patient consented CSW to speak in front of friends. Patient informed CSW that she resides home alone and that friends could provide transportation/home support if needed upon discharge. Patient confirmed HH/DME history. Patient did not remember [facility] agency name but stated she has a BSC, walker, rollator, cane and shower chair at home. Patient denied SNF history.  Expected Discharge Plan: Home/Self Care Barriers to Discharge: Continued Medical Work up   Patient Goals and CMS Choice Patient states their goals for this hospitalization and ongoing recovery are:: to return home          Expected Discharge Plan and Services       Living arrangements for the past 2 months: Single Family Home                                      Prior Living Arrangements/Services Living arrangements for the past 2 months: Single Family Home Lives with:: Self Patient language and need for interpreter reviewed:: Yes Do you feel safe going back to the place where you live?: Yes        Care giver support system in place?: Yes (comment)   Criminal Activity/Legal Involvement Pertinent to Current Situation/Hospitalization: No - Comment as needed  Activities of Daily Living   ADL Screening (condition at time of admission) Independently performs ADLs?: Yes (appropriate for developmental age) Is the patient deaf or have difficulty hearing?: No Does the patient have difficulty seeing, even when wearing glasses/contacts?: No Does the patient have difficulty concentrating, remembering, or making decisions?:  No  Permission Sought/Granted Permission sought to share information with : Other (comment) (Friends) Permission granted to share information with : Yes, Verbal Permission Granted  Share Information with NAME: Valente Gaskin     Permission granted to share info w Relationship: Friend  Permission granted to share info w Contact Information: [phone]  Emotional Assessment Appearance:: Appears stated age Attitude/Demeanor/Rapport: Engaged Affect (typically observed): Accepting, Adaptable, Stable, Calm, Appropriate, Pleasant Orientation: : Oriented to Self, Oriented to Place, Oriented to Situation, Oriented to  Time Alcohol / Substance Use: Not Applicable Psych Involvement: No (comment)  Admission diagnosis:  Hiatal hernia [K44.9] H/O hiatal hernia [Z87.19] Patient Active Problem List   Diagnosis Date Noted   Hiatal hernia 06/29/2023   Small fiber neuropathy 06/15/2021   Osteopenia of multiple sites 08/21/2020   History of total knee replacement, left 06/28/2017   Varicose veins of bilateral lower extremities with other complications 01/04/2017   DDD (degenerative disc disease), lumbar 10/19/2016   Goiter 08/28/2016   Family history of malignant neoplasm of digestive organ 08/27/2016   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   Fibromyalgia 05/17/2016   DJD (degenerative joint disease), cervical 02/18/2016   Heel spur 09/29/2015   Ingrown toenail 09/29/2015   Foraminal stenosis of cervical region 07/30/2015   Microcytic hypochromic anemia 05/22/2015   Tailor's bunion of left foot 01/18/2015   H/O hiatal hernia    Bilateral primary osteoarthritis of knee 12/15/2013  Iron deficiency 12/15/2013   Psoriatic arthritis (HCC) 12/15/2013   History of foot fracture 12/15/2013   Dermatitis 10/17/2013   Hx of diethylstilbestrol (DES) exposure in utero unknown 12/09/2012   Tinnitus 06/23/2012   Scalp lesion 01/21/2012   Headache 01/21/2012   Hx of psoriasis 01/21/2012   Weight gain 01/21/2012    Ankylosing spondylitis  possible 09/03/2011   Hx of transfusion 09/03/2011   S/P lumbar fusion 6 13 09/03/2011   Lumbar stenosis with neurogenic claudication 08/05/2011   Allergic rhinitis 02/19/2010   GERD 02/19/2010   SPINAL STENOSIS, CERVICAL 02/19/2010   Disorder of bone and cartilage 02/19/2010   IBS 04/28/2007   History of gastric ulcer 02/09/2007   History of colonic polyps 02/09/2007   CHOLECYSTECTOMY, HX OF 02/09/2007   Hyperlipidemia 12/29/2006   Essential hypertension 12/29/2006   PCP:  Reginal Capra, MD Pharmacy:   [facility] [phone] - [place], [place] - [address] AT CORNER OF [facility] [address] [place] [address] Phone: [phone] Fax: [phone]  [facility] [employer] [place], [place] - [address] [place] [address] Phone: [phone] Fax: [phone]     Social Drivers of Health (SDOH) Social History: SDOH Screenings   Food Insecurity: Patient Unable To Answer (06/29/2023)  Housing: Patient Unable To Answer (06/29/2023)  Transportation Needs: Patient Unable To Answer (06/29/2023)  Utilities: Patient Unable To Answer (06/29/2023)  Alcohol Screen: Low Risk  (05/08/2022)  Depression (PHQ2-9): Medium Risk (05/12/2023)  Financial Resource Strain: Medium Risk (05/08/2023)  Physical Activity: Insufficiently Active (05/08/2023)  Social Connections: Patient Unable To Answer (06/29/2023)  Stress: Stress Concern Present (05/08/2023)  Tobacco Use: Low Risk  (06/29/2023)   SDOH Interventions:     Readmission Risk Interventions     No data to display

## 2023-06-30 NOTE — Progress Notes (Addendum)
 NAME:  Jade Cardenas, MRN:  [id number], DOB:  11/12/1950, LOS: 1 ADMISSION DATE:  06/29/2023, CONSULTATION DATE:  06/29/2023 REFERRING MD:  Nella Bame , CHIEF COMPLAINT:  Post-op complication    History of Present Illness:  This is a 73 year old female with a history below ,who initially presented for a large hiatal hernia surgery on 4/29.  Her surgery was complicated by bleeding,she got blood products, stabilized and intubated , and transferred to the ICU.  Pertinent  Medical History   Past Medical History:  Diagnosis Date   Anemia    iron def   Arthritis    rheumatoid, psoriatic, osteoarthritis    Bone spur    RIGHT FOOT    Broken toe    Bruises easily    Cellulitis    Chronic back pain    COLONIC POLYPS, HX OF 02/09/2007   Cough, persistent 01/21/2012   poss from acei    DES exposure in utero, unknown    Femur fracture, left (HCC)    Fibromyalgia    doesn't require meds   GASTRIC ULCER, ACUTE, HEMORRHAGE, HX OF 02/09/2007   GERD (gastroesophageal reflux disease)    takes Nexium  daily   Goiter    H/O hiatal hernia    H/O: hysterectomy    when 73 yrs old   Headache    Occasional migraines with increased stress   Hx of seasonal allergies    takes Claritin  daily   HYPERLIPIDEMIA 12/29/2006   takes Lovastatin  nightly   HYPERTENSION 12/29/2006   takes Enalapril  daily   IBS 04/28/2007   Insomnia    related to pain;takes Flexeril  and Tylenol  PM nightly   Joint pain    Joint swelling    Neuropathy    small cell   Osteopenia    Palpitations 02/09/2007   PONV (postoperative nausea and vomiting)    NAUSEA   REDUCTION MAMMOPLASTY, HX OF 02/09/2007   Right bundle branch block 02/09/2007   S/P lumbar fusion 6 13 09/03/2011   L4 L5  posterior    SYNCOPE 12/07/2007   Tendonitis    RLE    UNS ADVRS EFF OTH RX MEDICINAL\T\BIOLOGICAL SBSTNC 02/09/2007     Significant Hospital Events: Including procedures, antibiotic start and stop dates in addition to other  pertinent events   4/29 Hiatal hernia  4/29 Intubated 4/30 Extubated   Interim History / Subjective:  Patient is seen at bedside, she is following commands and responding to commands appropriately   Objective   Blood pressure (!) 116/52, pulse 92, temperature 100.1 F (37.8 C), temperature source Axillary, resp. rate (!) 24, height 5\' 5"  (1.651 m), weight 95.3 kg, last menstrual period 03/02/1984, SpO2 100%.    Vent Mode: PSV;CPAP FiO2 (%):  [40 %] 40 % Set Rate:  [16 bmp] 16 bmp Vt Set:  [450 mL] 450 mL PEEP:  [5 cmH20] 5 cmH20 Pressure Support:  [10 cmH20] 10 cmH20 Plateau Pressure:  [18 cmH20-20 cmH20] 19 cmH20   Intake/Output Summary (Last 24 hours) at 06/30/2023 0802 Last data filed at 06/30/2023 0400 Gross per 24 hour  Intake 6317.94 ml  Output 3825 ml  Net 2492.94 ml   Filed Weights   06/29/23 0547  Weight: 95.3 kg    Examination: General: NAD, Extubated  HENT: Normocephalic and atraumatic. ,Mucous membranes are moist.  Lungs: On Mechanical vent  Cardiovascular: RRR Abdomen: PEG tube in place, soft non tender, no guarding or rebound tenderness  Extremities: warm and dry  Neuro: Responsive  Resolved Hospital Problem list   None   Assessment & Plan:  # Acute hypoxic respiratory failure - Extubated to room air  - Discuss going back to the floor for post op management after extubation   #Attempted xi robotic assisted hiatal hernia repair with fundoplication and mesh (n/a) - no mesh inserted laparotomy, exploratory w/ligation splenic artery and gastric tube placement - Dr Shela Derby is following  - F/u on recs  - Pain control     Best Practice (right click and "Reselect all SmartList Selections" daily)   Diet/type: NPO DVT prophylaxis: prophylactic heparin  GI prophylaxis: PPI Lines: Central line Foley:  N/A Code Status:  full code Last date of multidisciplinary goals of care discussion [06/29/2022]  Labs   CBC: Recent Labs  Lab 06/29/23 0947  06/29/23 1023 06/29/23 1154 06/29/23 1200 06/30/23 0520  WBC  --   --   --  14.3* 10.3  NEUTROABS  --   --   --  13.4*  --   HGB 7.8* 7.8* 8.8* 10.2* 9.2*  HCT 23.0* 23.0* 26.0* 28.2* 25.5*  MCV  --   --   --  84.4 82.5  PLT  --   --   --  118* 122*    Basic Metabolic Panel: Recent Labs  Lab 06/29/23 0947 06/29/23 1023 06/29/23 1154 06/29/23 1358 06/30/23 0520  NA 143 142 142 142 141  K 4.6 3.7 3.6 3.5 3.8  CL  --   --   --  106 107  CO2  --   --   --  26 24  GLUCOSE  --   --   --  204* 114*  BUN  --   --   --  15 19  CREATININE  --   --   --  0.93 0.88  CALCIUM  --   --   --  9.2 8.4*   GFR: Estimated Creatinine Clearance: 66.9 mL/min (by C-G formula based on SCr of 0.88 mg/dL). Recent Labs  Lab 06/29/23 1200 06/30/23 0520  WBC 14.3* 10.3    Liver Function Tests: Recent Labs  Lab 06/29/23 1358 06/30/23 0520  AST 377* 214*  ALT 418* 242*  ALKPHOS 34* 33*  BILITOT 3.2* 1.8*  PROT 5.4* 5.0*  ALBUMIN 3.9 3.3*   No results for input(s): "LIPASE", "AMYLASE" in the last 168 hours. No results for input(s): "AMMONIA" in the last 168 hours.  ABG    Component Value Date/Time   PHART 7.379 06/29/2023 1154   PCO2ART 39.7 06/29/2023 1154   PO2ART 149 (H) 06/29/2023 1154   HCO3 23.6 06/29/2023 1154   TCO2 25 06/29/2023 1154   ACIDBASEDEF 2.0 06/29/2023 1154   O2SAT 99 06/29/2023 1154     Coagulation Profile: Recent Labs  Lab 06/29/23 1200  INR 1.6*    Cardiac Enzymes: No results for input(s): "CKTOTAL", "CKMB", "CKMBINDEX", "TROPONINI" in the last 168 hours.  HbA1C: Hgb A1c MFr Bld  Date/Time Value Ref Range Status  05/03/2023 09:43 AM 5.8 4.6 - 6.5 % Final    Comment:    Glycemic Control Guidelines for People with Diabetes:Non Diabetic:  <6%Goal of Therapy: <7%Additional Action Suggested:  >8%   11/04/2022 09:45 AM 5.6 4.6 - 6.5 % Final    Comment:    Glycemic Control Guidelines for People with Diabetes:Non Diabetic:  <6%Goal of Therapy:  <7%Additional Action Suggested:  >8%     CBG: Recent Labs  Lab 06/29/23 1925 06/29/23 2323 06/30/23 0325 06/30/23 0748 06/30/23 0750  GLUCAP 104* 74 80 55* 84    Review of Systems:   Endorses abdominal pain   Past Medical History:  She,  has a past medical history of Anemia, Arthritis, Bone spur, Broken toe, Bruises easily, Cellulitis, Chronic back pain, COLONIC POLYPS, HX OF (02/09/2007), Cough, persistent (01/21/2012), DES exposure in utero, unknown, Femur fracture, left (HCC), Fibromyalgia, GASTRIC ULCER, ACUTE, HEMORRHAGE, HX OF (02/09/2007), GERD (gastroesophageal reflux disease), Goiter, H/O hiatal hernia, H/O: hysterectomy, Headache, seasonal allergies, HYPERLIPIDEMIA (12/29/2006), HYPERTENSION (12/29/2006), IBS (04/28/2007), Insomnia, Joint pain, Joint swelling, Neuropathy, Osteopenia, Palpitations (02/09/2007), PONV (postoperative nausea and vomiting), REDUCTION MAMMOPLASTY, HX OF (02/09/2007), Right bundle branch block (02/09/2007), S/P lumbar fusion 6 13 (09/03/2011), SYNCOPE (12/07/2007), Tendonitis, and UNS ADVRS EFF OTH RX MEDICINAL\T\BIOLOGICAL SBSTNC (02/09/2007).   Surgical History:   Past Surgical History:  Procedure Laterality Date   BACK SURGERY  12+yrs ago   Synovial Cyst removal   BACK SURGERY  08/2011   lumbar L4-L5 fusion, lamenectomy   bone spur  6-53yrs ago   right ankle   BUNIONECTOMY  10/03/2014   right foot   BUNIONECTOMY Left    CERVICAL FUSION     2 12    CHOLECYSTECTOMY  1996   HARDWARE REMOVAL Left 08/01/2018   Procedure: Left knee hardware removal;  Surgeon: Liliane Rei, MD;  Location: [facility];  Service: Orthopedics;  Laterality: Left;    POSTERIOR CERVICAL LAMINECTOMY Left 07/30/2015   Procedure: Laminectomy and Foraminotomy - left - Cervical two -Cervical three;  Surgeon: Agustina Aldrich, MD;  Location: [facility];  Service: Neurosurgery;  Laterality: Left;   REDUCTION MAMMAPLASTY Bilateral 1989   TONSILLECTOMY     as a child   TOTAL  ABDOMINAL HYSTERECTOMY W/ BILATERAL SALPINGOOPHORECTOMY  1986   TOTAL KNEE ARTHROPLASTY N/A 06/21/2017   Procedure: LEFT TOTAL KNEE ARTHROPLASTY AND RIGHT KNEE CORTISONE INJECTION;  Surgeon: Liliane Rei, MD;  Location: [facility];  Service: Orthopedics;  Laterality: N/A;     Social History:   reports that she has never smoked. She has never been exposed to tobacco smoke. She has never used smokeless tobacco. She reports that she does not drink alcohol and does not use drugs.   Family History:  Her family history includes Arthritis in her brother and mother; Colon cancer in her father and mother; Diabetes in her brother and mother; Endometrial cancer in her mother; Heart disease in her father; Hyperlipidemia in her brother; Hypertension in her brother, maternal grandmother, and mother; Infertility in her brother; Other in an other family member; Stroke in her father and maternal grandmother. There is no history of Anesthesia problems, Hypotension, Malignant hyperthermia, or Pseudochol deficiency.   Allergies Allergies  Allergen Reactions   Erythromycin Hives, Swelling and Other (See Comments)    All Mycins; throat swells shut and she gets "really hot"   Iodinated Contrast Media Shortness Of Breath and Swelling    CT contrast; throat swelling, difficulty breathing.  In [place]  "many years ago."   Ace Inhibitors Cough   Indomethacin Other (See Comments)   Keflex [Cephalexin] Hives   Macrobid  [Nitrofurantoin ] Other (See Comments)    dizziness     Home Medications  Prior to Admission medications   Medication Sig Start Date End Date Taking? Authorizing Provider  acetaminophen  (TYLENOL ) 650 MG CR tablet Take 1,300 mg by mouth See admin instructions. Take 1300 mg in the morning and may take an additional 1300 mg dose as needed for pain   Yes [provider]  Calcium Carb-Cholecalciferol  (  CALCIUM+D3 PO) Take 1 tablet by mouth daily.   Yes [provider]  carboxymethylcellulose  (REFRESH PLUS) 0.5 % SOLN Place 1 drop into both eyes as needed (dry eyes).   Yes [provider]  diphenhydramine -acetaminophen  (TYLENOL  PM) 25-500 MG TABS tablet Take 1 tablet by mouth at bedtime.   Yes [provider]  folic acid  (FOLVITE ) 1 MG tablet Take 1 tablet (1 mg total) by mouth daily. 05/13/23  Yes Romayne Clubs, PA-C  losartan  (COZAAR ) 100 MG tablet Take 1 tablet (100 mg total) by mouth daily. 11/12/22  Yes Panosh, Joaquim Muir, MD  lovastatin  (MEVACOR ) 20 MG tablet Take 1 tablet (20 mg total) by mouth every evening. 11/12/22  Yes Panosh, Wanda K, MD  methotrexate  50 MG/2ML injection INJECT 0.3ML INTO THE SKIN ONCE WEEKLY 03/01/23  Yes Romayne Clubs, PA-C  Multiple Vitamins-Minerals (MULTIVITAMIN ADULTS PO) Take 1 tablet by mouth daily.    Yes [provider]  pantoprazole  (PROTONIX ) 40 MG tablet Take 40 mg by mouth 2 (two) times daily. 09/23/22  Yes [provider]  B-D ALLERGY  SYRINGE 1CC/28G 28G X 1/2" 1 ML MISC USE AS DIRECTED ONCE WEEKLY 06/17/21   Romayne Clubs, PA-C  SIMPONI  50 MG/0.5ML SOAJ INJECT 50MG  UNDER THE SKIN ONCE MONTHLY 04/30/23   Nicholas Bari, MD     Critical care time: 61      Fay Hoop MD [facility] Internal Medicine Program - PGY-1 06/30/2023, 8:02 AM Pager# [phone]

## 2023-06-30 NOTE — Progress Notes (Signed)
 of fentanyl  2533mcg/250ml wasted with Annice Barthel RN

## 2023-06-30 NOTE — Inpatient Diabetes Management (Signed)
 Inpatient Diabetes Program Recommendations  AACE/ADA: New Consensus Statement on Inpatient Glycemic Control (2015)  Target Ranges:  Prepandial:   less than 140 mg/dL      Peak postprandial:   less than 180 mg/dL (1-2 hours)      Critically ill patients:  140 - 180 mg/dL   Lab Results  Component Value Date   GLUCAP 85 06/30/2023   HGBA1C 5.8 05/03/2023    Latest Reference Range & Units 06/29/23 11:21 06/29/23 15:14 06/29/23 19:25 06/29/23 23:23 06/30/23 03:25 06/30/23 07:48 06/30/23 07:50 06/30/23 11:27  Glucose-Capillary 70 - 99 mg/dL 409 (H) 811 (H) Novolog 5 units 104 (H) 74 80 55 (L) 84 85  (H): Data is abnormally high (L): Data is abnormally low  Diabetes history: Prediabetes Outpatient Diabetes medications: None Current orders for Inpatient glycemic control: Novolog 0-15 units q 4 hrs.  Inpatient Diabetes Program Recommendations:   Patient had hypoglycemia post Novolog correction. Please consider: -Decrease Novolog correction to 0-6 units q 4 hrs.  Thank you, Joshawn Crissman E. Maureen Duesing, RN, MSN, CDCES  Diabetes Coordinator Inpatient Glycemic Control Team Team Pager [phone] (8am-5pm) 06/30/2023 11:47 AM

## 2023-06-30 NOTE — Progress Notes (Signed)
 1 Day Post-Op   Subjective/Chief Complaint: Pt more awake today Following commands and trying to verbalize Weaning vent   Objective: Vital signs in last 24 hours: Temp:  [97.5 F (36.4 C)-100.2 F (37.9 C)] 100.2 F (37.9 C) (04/30 0326) Pulse Rate:  [65-97] 93 (04/30 0715) Resp:  [0-30] 20 (04/30 0715) BP: (85-182)/(31-81) 116/52 (04/30 0700) SpO2:  [82 %-100 %] 100 % (04/30 0715) Arterial Line BP: (83-228)/(31-96) 127/46 (04/30 0715) FiO2 (%):  [40 %] 40 % (04/30 0228)    Intake/Output from previous day: 04/29 0701 - 04/30 0700 In: 6317.9 [I.V.:1667.9; Blood:2900; IV Piggyback:1750] Out: 3825 [Urine:1325; Blood:2500] Intake/Output this shift: No intake/output data recorded.  General appearance: alert and cooperative Resp: ETT in with min setting Incision/Wound: inc c/d/i  Lab Results:  Recent Labs    06/29/23 1200 06/30/23 0520  WBC 14.3* 10.3  HGB 10.2* 9.2*  HCT 28.2* 25.5*  PLT 118* 122*   BMET Recent Labs    06/29/23 1358 06/30/23 0520  NA 142 141  K 3.5 3.8  CL 106 107  CO2 26 24  GLUCOSE 204* 114*  BUN 15 19  CREATININE 0.93 0.88  CALCIUM 9.2 8.4*   PT/INR Recent Labs    06/29/23 1200  LABPROT 19.4*  INR 1.6*   ABG Recent Labs    06/29/23 1023 06/29/23 1154  PHART 7.348* 7.379  HCO3 20.3 23.6    Studies/Results: DG CHEST PORT 1 VIEW Result Date: 06/29/2023 CLINICAL DATA:  Status post intubation. EXAM: PORTABLE CHEST 1 VIEW COMPARISON:  Chest radiograph dated 07/30/2011. FINDINGS: Endotracheal tube tip is located 3.4 cm above the carina. Low lung volumes. Cardiomediastinal silhouette is within normal limits. Streaky bibasilar opacities. No sizable pleural effusion or pneumothorax. No acute osseous abnormality. IMPRESSION: 1. Endotracheal tube tip is located 3.4 cm above the carina. 2. Low lung volumes with streaky bibasilar opacities, which could reflect atelectasis or edema. Electronically Signed   By: Mannie Seek M.D.   On:  06/29/2023 15:52    Anti-infectives: Anti-infectives (From admission, onward)    Start     Dose/Rate Route Frequency Ordered Stop   06/29/23 0600  ceFAZolin  (ANCEF ) IVPB 2g/100 mL premix        2 g 200 mL/hr over 30 Minutes Intravenous On call to O.R. 06/29/23 0551 06/29/23 0800       Assessment/Plan: s/p Procedure(s) with comments: ATTEMPTED XI ROBOTIC ASSISTED HIATAL HERNIA REPAIR WITH FUNDOPLICATION AND MESH (N/A) - NO MESH INSERTED LAPAROTOMY, EXPLORATORY W/LIGATION SPLENIC ARTERY AND GASTRIC TUBE PLACEMENT (N/A) -Appreciate CCM assistance with Vent mgmt -con't NGT to gravity -Hct appears stable as expected -once ETT out OK for clears and can cap Gtube  LOS: 1 day    Shela Derby 06/30/2023

## 2023-06-30 NOTE — Progress Notes (Signed)
 This pt. Refused her Insulin injection and Blood sugar checks macular degeneration made aware.

## 2023-06-30 NOTE — Plan of Care (Signed)

## 2023-06-30 NOTE — Procedures (Deleted)
 Central Venous Catheter Insertion Procedure Note  Jade Cardenas  [id number]  30-Nov-1950  Date:06/30/23  Time:12:04 PM   Provider Performing:Markice Torbert   Procedure: Insertion of Non-tunneled Central Venous Catheter(36556)with US  guidance (40981)    Indication(s) Hemodialysis  Consent Risks of the procedure as well as the alternatives and risks of each were explained to the patient and/or caregiver.  Consent for the procedure was obtained and is signed in the bedside chart  Anesthesia Topical only with 1% lidocaine    Timeout Verified patient identification, verified procedure, site/side was marked, verified correct patient position, special equipment/implants available, medications/allergies/relevant history reviewed, required imaging and test results available.  Sterile Technique Maximal sterile technique including full sterile barrier drape, hand hygiene, sterile gown, sterile gloves, mask, hair covering, sterile ultrasound probe cover (if used).  Procedure Description Area of catheter insertion was cleaned with chlorhexidine  and draped in sterile fashion.   With real-time ultrasound guidance a HD catheter was placed into the left femoral vein.  Nonpulsatile blood flow and easy flushing noted in all ports.  The catheter was sutured in place and sterile dressing applied.  Complications/Tolerance None; patient tolerated the procedure well.   Chest x-ray is not ordered for femoral cannulation.  EBL Minimal  Specimen(s) None   FAmily updated to risk and complications  .Jade Cardenas ACNP Acute Care Nurse Practitioner [facility] Pul[facility]/Critical Care Please consult Amion 06/30/2023, 12:06 PM

## 2023-06-30 NOTE — Procedures (Signed)
 Extubation Procedure Note  Patient Details:   Name: Jade Cardenas DOB: 17-Nov-1950 MRN: [id number]   Airway Documentation:    Vent end date: 06/30/23 Vent end time: 0911   Evaluation  O2 sats: stable throughout Complications: No apparent complications Patient did tolerate procedure well. Bilateral Breath Sounds: Clear   Yes,  Per CCM order, RT extubated pt to [facility]. Prior to extubation, pt did have a positive cuff leak. Pt was able to state her name, no stridor noted and no complications.  Oletha Tolson R 06/30/2023, 9:13 AM

## 2023-07-01 DIAGNOSIS — K559 Vascular disorder of intestine, unspecified: Secondary | ICD-10-CM

## 2023-07-01 DIAGNOSIS — J811 Chronic pulmonary edema: Secondary | ICD-10-CM

## 2023-07-01 DIAGNOSIS — I8290 Acute embolism and thrombosis of unspecified vein: Secondary | ICD-10-CM

## 2023-07-01 DIAGNOSIS — I509 Heart failure, unspecified: Secondary | ICD-10-CM

## 2023-07-01 HISTORY — DX: Vascular disorder of intestine, unspecified: K55.9

## 2023-07-01 HISTORY — DX: Heart failure, unspecified: I50.9

## 2023-07-01 HISTORY — DX: Chronic pulmonary edema: J81.1

## 2023-07-01 HISTORY — DX: Acute embolism and thrombosis of unspecified vein: I82.90

## 2023-07-01 LAB — CBC
HCT: 21.2 % — ABNORMAL LOW (ref 36.0–46.0)
HCT: 20.9 % — ABNORMAL LOW (ref 36.0–46.0)
Hemoglobin: 7.3 g/dL — ABNORMAL LOW (ref 12.0–15.0)
Hemoglobin: 7 g/dL — ABNORMAL LOW (ref 12.0–15.0)
MCH: 30.2 pg (ref 26.0–34.0)
MCH: 29.8 pg (ref 26.0–34.0)
MCHC: 34.4 g/dL (ref 30.0–36.0)
MCHC: 33.5 g/dL (ref 30.0–36.0)
MCV: 87.6 fL (ref 80.0–100.0)
MCV: 88.9 fL (ref 80.0–100.0)
Platelets: 126 10*3/uL — ABNORMAL LOW (ref 150–400)
Platelets: 123 10*3/uL — ABNORMAL LOW (ref 150–400)
RBC: 2.42 MIL/uL — ABNORMAL LOW (ref 3.87–5.11)
RBC: 2.35 MIL/uL — ABNORMAL LOW (ref 3.87–5.11)
RDW: 16.4 % — ABNORMAL HIGH (ref 11.5–15.5)
RDW: 16.3 % — ABNORMAL HIGH (ref 11.5–15.5)
WBC: 15.4 10*3/uL — ABNORMAL HIGH (ref 4.0–10.5)
WBC: 16 10*3/uL — ABNORMAL HIGH (ref 4.0–10.5)
nRBC: 0 % (ref 0.0–0.2)
nRBC: 0.1 % (ref 0.0–0.2)

## 2023-07-01 LAB — COMPREHENSIVE METABOLIC PANEL WITH GFR
ALT: 167 U/L — ABNORMAL HIGH (ref 0–44)
ALT: 122 U/L — ABNORMAL HIGH (ref 0–44)
AST: 108 U/L — ABNORMAL HIGH (ref 15–41)
AST: 61 U/L — ABNORMAL HIGH (ref 15–41)
Albumin: 2.9 g/dL — ABNORMAL LOW (ref 3.5–5.0)
Albumin: 3.3 g/dL — ABNORMAL LOW (ref 3.5–5.0)
Alkaline Phosphatase: 32 U/L — ABNORMAL LOW (ref 38–126)
Alkaline Phosphatase: 33 U/L — ABNORMAL LOW (ref 38–126)
Anion gap: 7 (ref 5–15)
Anion gap: 9 (ref 5–15)
BUN: 31 mg/dL — ABNORMAL HIGH (ref 8–23)
BUN: 26 mg/dL — ABNORMAL HIGH (ref 8–23)
CO2: 28 mmol/L (ref 22–32)
CO2: 24 mmol/L (ref 22–32)
Calcium: 8.1 mg/dL — ABNORMAL LOW (ref 8.9–10.3)
Calcium: 8.3 mg/dL — ABNORMAL LOW (ref 8.9–10.3)
Chloride: 105 mmol/L (ref 98–111)
Chloride: 106 mmol/L (ref 98–111)
Creatinine, Ser: 1.23 mg/dL — ABNORMAL HIGH (ref 0.44–1.00)
Creatinine, Ser: 0.93 mg/dL (ref 0.44–1.00)
GFR, Estimated: 47 mL/min — ABNORMAL LOW (ref >60)
GFR, Estimated: >60 mL/min (ref >60)
Glucose, Bld: 122 mg/dL — ABNORMAL HIGH (ref 70–99)
Glucose, Bld: 140 mg/dL — ABNORMAL HIGH (ref 70–99)
Potassium: 3.6 mmol/L (ref 3.5–5.1)
Potassium: 3.4 mmol/L — ABNORMAL LOW (ref 3.5–5.1)
Sodium: 140 mmol/L (ref 135–145)
Sodium: 139 mmol/L (ref 135–145)
Total Bilirubin: 1.4 mg/dL — ABNORMAL HIGH (ref 0.0–1.2)
Total Bilirubin: 1.5 mg/dL — ABNORMAL HIGH (ref 0.0–1.2)
Total Protein: 4.8 g/dL — ABNORMAL LOW (ref 6.5–8.1)
Total Protein: 5.1 g/dL — ABNORMAL LOW (ref 6.5–8.1)

## 2023-07-01 LAB — PROTIME-INR
INR: 1.3 — ABNORMAL HIGH (ref 0.8–1.2)
Prothrombin Time: 15.9 s — ABNORMAL HIGH (ref 11.4–15.2)

## 2023-07-01 MED ORDER — ALBUMIN HUMAN 5 % IV SOLN
25.0000 g | Freq: Once | INTRAVENOUS | Status: AC
  Administered 2023-07-01: 25 g via INTRAVENOUS
  Filled 2023-07-01: qty 500

## 2023-07-01 MED ORDER — METHOCARBAMOL 500 MG PO TABS
500.0000 mg | ORAL_TABLET | Freq: Three times a day (TID) | ORAL | Status: AC
Start: 2023-07-01 — End: 2023-07-03
  Administered 2023-07-01 – 2023-07-02 (×7): 500 mg via ORAL
  Filled 2023-07-01 (×7): qty 1

## 2023-07-01 NOTE — Progress Notes (Signed)
 Removed Patient's foley emptied 200 ml of urine from container.

## 2023-07-01 NOTE — Progress Notes (Signed)
 MEWS Progress Note  Patient Details Name: Jade Cardenas MRN: [id number] DOB: [birth year] Today's Date: 07/01/2023   MEWS Flowsheet Documentation:  Assess: MEWS Score Temp: 98.3 F (36.8 C) BP: (!) 93/36 MAP (mmHg): (!) 54 Pulse Rate: (!) 104 ECG Heart Rate: (!) 106 Resp: 17 Level of Consciousness: Alert SpO2: 92 % O2 Device: Room Air FiO2 (%): 40 % Assess: MEWS Score MEWS Temp: 0 MEWS Systolic: 1 MEWS Pulse: 1 MEWS RR: 0 MEWS LOC: 0 MEWS Score: 2 MEWS Score Color: Yellow Assess: SIRS CRITERIA SIRS Temperature : 0 SIRS Respirations : 0 SIRS Pulse: 1 SIRS WBC: 0 SIRS Score Sum : 1 Assess: if the MEWS score is Yellow or Red Were vital signs accurate and taken at a resting state?: Yes Does the patient meet 2 or more of the SIRS criteria?: No MEWS guidelines implemented : No, previously yellow, continue vital signs every 4 hours Treat MEWS Interventions: Considered administering scheduled or prn medications/treatments as ordered Take Vital Signs Increase Vital Sign Frequency : Yellow: Q2hr x1, continue Q4hrs until patient remains green for 12hrs Escalate MEWS: Escalate: Yellow: Discuss with charge nurse and consider notifying provider and/or RRT        [name] 07/01/2023, 11:20 AM

## 2023-07-01 NOTE — Progress Notes (Signed)
 Performed intermittent catheterization with Lindle Rhea, RN and as a verifier. Emptied 450 ml of clear yellow urine.

## 2023-07-01 NOTE — Plan of Care (Signed)
  Problem: Pain Managment: Goal: General experience of comfort will improve and/or be controlled Outcome: Progressing   Problem: Safety: Goal: Ability to remain free from injury will improve Outcome: Progressing   Problem: Skin Integrity: Goal: Risk for impaired skin integrity will decrease Outcome: Progressing

## 2023-07-01 NOTE — Progress Notes (Signed)
 2 Days Post-Op   Subjective/Chief Complaint: Doing well Som abd pain   Objective: Vital signs in last 24 hours: Temp:  [98.4 F (36.9 C)-100.1 F (37.8 C)] 98.6 F (37 C) (05/01 1610) Pulse Rate:  [88-113] 109 (05/01 0638) Resp:  [14-36] 18 (05/01 0638) BP: (94-131)/(40-68) 108/48 (05/01 0638) SpO2:  [85 %-100 %] 90 % (05/01 9604) Arterial Line BP: (75-158)/(37-60) 108/52 (04/30 1400) FiO2 (%):  [40 %] 40 % (04/30 0800) Last BM Date :  (pta)  Intake/Output from previous day: 04/30 0701 - 05/01 0700 In: 871.8 [I.V.:871.8] Out: 800 [Urine:800] Intake/Output this shift: No intake/output data recorded.  General appearance: alert and cooperative GI: soft, non-tender; bowel sounds normal; no masses,  no organomegaly and inc c/d/I, Gtube in place  Lab Results:  Recent Labs    06/29/23 1200 06/30/23 0520  WBC 14.3* 10.3  HGB 10.2* 9.2*  HCT 28.2* 25.5*  PLT 118* 122*   BMET Recent Labs    06/29/23 1358 06/30/23 0520  NA 142 141  K 3.5 3.8  CL 106 107  CO2 26 24  GLUCOSE 204* 114*  BUN 15 19  CREATININE 0.93 0.88  CALCIUM  9.2 8.4*   PT/INR Recent Labs    06/29/23 1200  LABPROT 19.4*  INR 1.6*   ABG Recent Labs    06/29/23 1023 06/29/23 1154  PHART 7.348* 7.379  HCO3 20.3 23.6    Studies/Results: DG CHEST PORT 1 VIEW Result Date: 06/29/2023 CLINICAL DATA:  Status post intubation. EXAM: PORTABLE CHEST 1 VIEW COMPARISON:  Chest radiograph dated 07/30/2011. FINDINGS: Endotracheal tube tip is located 3.4 cm above the carina. Low lung volumes. Cardiomediastinal silhouette is within normal limits. Streaky bibasilar opacities. No sizable pleural effusion or pneumothorax. No acute osseous abnormality. IMPRESSION: 1. Endotracheal tube tip is located 3.4 cm above the carina. 2. Low lung volumes with streaky bibasilar opacities, which could reflect atelectasis or edema. Electronically Signed   By: Mannie Seek M.D.   On: 06/29/2023 15:52     Anti-infectives: Anti-infectives (From admission, onward)    Start     Dose/Rate Route Frequency Ordered Stop   06/30/23 1300  ciprofloxacin  (CIPRO ) tablet 500 mg        500 mg Oral 2 times daily 06/30/23 1213 07/03/23 0759   06/29/23 0600  ceFAZolin  (ANCEF ) IVPB 2g/100 mL premix        2 g 200 mL/hr over 30 Minutes Intravenous On call to O.R. 06/29/23 0551 06/29/23 0800       Assessment/Plan: s/p Procedure(s) with comments: ATTEMPTED XI ROBOTIC ASSISTED HIATAL HERNIA REPAIR WITH FUNDOPLICATION AND MESH (N/A) - NO MESH INSERTED LAPAROTOMY, EXPLORATORY W/LIGATION SPLENIC ARTERY AND GASTRIC TUBE PLACEMENT (N/A) POD2 -DC foley -DC AC/HS BG -adv to soft diet -PT -abx fo UTI  LOS: 2 days    Jade Cardenas 07/01/2023

## 2023-07-01 NOTE — Hospital Course (Signed)
 Postoperative patient was taken to the ICU intubated and in critical condition. Follow-up labs revealed a stable hematocrit.  Reviewed. Patient was seen by CCM to help with vent support.  Patient was subsequently weaned and extubated on postop day 1.  Postop day 1 patient did have some pain discomfort as expected.  Patient hemoglobin stabilized.  She was started on a liquid diet advance to full liquid diet which she tolerated well.  She was transferred to the floor.  Patient began working with physical therapy.  She was transition to a soft diet.  Patient continue to work on her pain control with muscle relaxers and pain medication.

## 2023-07-01 NOTE — Plan of Care (Signed)
  Problem: Education: Goal: Knowledge of General Education information will improve Description: Including pain rating scale, medication(s)/side effects and non-pharmacologic comfort measures Outcome: Progressing   Problem: Activity: Goal: Risk for activity intolerance will decrease Outcome: Progressing   Problem: Nutrition: Goal: Adequate nutrition will be maintained Outcome: Progressing   Problem: Elimination: Goal: Will not experience complications related to urinary retention Outcome: Progressing   Problem: Pain Managment: Goal: General experience of comfort will improve and/or be controlled Outcome: Progressing

## 2023-07-02 LAB — CBC
HCT: 22.1 % — ABNORMAL LOW (ref 36.0–46.0)
Hemoglobin: 7.3 g/dL — ABNORMAL LOW (ref 12.0–15.0)
MCH: 29.7 pg (ref 26.0–34.0)
MCHC: 33 g/dL (ref 30.0–36.0)
MCV: 89.8 fL (ref 80.0–100.0)
Platelets: 135 10*3/uL — ABNORMAL LOW (ref 150–400)
RBC: 2.46 MIL/uL — ABNORMAL LOW (ref 3.87–5.11)
RDW: 16.1 % — ABNORMAL HIGH (ref 11.5–15.5)
WBC: 17.4 10*3/uL — ABNORMAL HIGH (ref 4.0–10.5)
nRBC: 0 % (ref 0.0–0.2)

## 2023-07-02 LAB — COMPREHENSIVE METABOLIC PANEL WITH GFR
ALT: 116 U/L — ABNORMAL HIGH (ref 0–44)
AST: 56 U/L — ABNORMAL HIGH (ref 15–41)
Albumin: 3.3 g/dL — ABNORMAL LOW (ref 3.5–5.0)
Alkaline Phosphatase: 44 U/L (ref 38–126)
Anion gap: 9 (ref 5–15)
BUN: 20 mg/dL (ref 8–23)
CO2: 25 mmol/L (ref 22–32)
Calcium: 8.4 mg/dL — ABNORMAL LOW (ref 8.9–10.3)
Chloride: 106 mmol/L (ref 98–111)
Creatinine, Ser: 0.79 mg/dL (ref 0.44–1.00)
GFR, Estimated: >60 mL/min (ref >60)
Glucose, Bld: 141 mg/dL — ABNORMAL HIGH (ref 70–99)
Potassium: 3.4 mmol/L — ABNORMAL LOW (ref 3.5–5.1)
Sodium: 140 mmol/L (ref 135–145)
Total Bilirubin: 1.7 mg/dL — ABNORMAL HIGH (ref 0.0–1.2)
Total Protein: 5.8 g/dL — ABNORMAL LOW (ref 6.5–8.1)

## 2023-07-02 MED ORDER — MAGIC MOUTHWASH
2.0000 mL | Freq: Three times a day (TID) | ORAL | Status: DC
  Administered 2023-07-02 – 2023-07-06 (×10): 2 mL via ORAL
  Filled 2023-07-02 (×15): qty 5

## 2023-07-02 MED ORDER — METOCLOPRAMIDE HCL 5 MG/ML IJ SOLN
10.0000 mg | Freq: Four times a day (QID) | INTRAMUSCULAR | Status: AC
  Administered 2023-07-02 – 2023-07-03 (×3): 10 mg via INTRAVENOUS
  Filled 2023-07-02 (×3): qty 2

## 2023-07-02 MED ORDER — KCL IN DEXTROSE-NACL 20-5-0.45 MEQ/L-%-% IV SOLN
INTRAVENOUS | Status: AC
  Filled 2023-07-02 (×3): qty 1000

## 2023-07-02 MED ORDER — CHLORHEXIDINE GLUCONATE CLOTH 2 % EX PADS
6.0000 | MEDICATED_PAD | Freq: Every day | CUTANEOUS | Status: DC
  Administered 2023-07-03 – 2023-07-06 (×4): 6 via TOPICAL

## 2023-07-02 MED ORDER — BETHANECHOL CHLORIDE 10 MG PO TABS
10.0000 mg | ORAL_TABLET | Freq: Three times a day (TID) | ORAL | Status: DC
  Administered 2023-07-02 – 2023-07-06 (×13): 10 mg via ORAL
  Filled 2023-07-02 (×4): qty 2
  Filled 2023-07-02: qty 1
  Filled 2023-07-02 (×3): qty 2
  Filled 2023-07-02 (×3): qty 1
  Filled 2023-07-02 (×2): qty 2

## 2023-07-02 NOTE — Evaluation (Signed)
 Physical Therapy Evaluation Patient Details Name: Jade Cardenas MRN: [id number] DOB: Aug 12, 1950 Today's Date: 07/02/2023  History of Present Illness  73 y.o. female presents to [facility] on 06/29/2023 with large hiatal hernia and Cameron's ulcers. Pt underwent attempted hiatal hernia repair, complicated by excessive bleeding and requiring laparotomy with ligation of splenic artery and G-tube placement. Pt requires chest compressions during procedure, remained intubated and on pressors after. Pt extubated on 4/30. PMH includes HLD, HTN, GERD, Lumbar fusion, psoriatic arthritis.  Clinical Impression  Pt presents to PT with deficits in functional mobility, gait, balance, strength, power, endurance. Pt requires physical assistance for bed mobility and transfers due to generalized weakness at this time. Pt will benefit from frequent mobilization in an effort to improve activity tolerance and to reduce falls risk. Pt was very independent prior to this admission and demonstrates the potential to make significant functional gains with high intensity inpatient PT services.        If plan is discharge home, recommend the following: A lot of help with walking and/or transfers;A lot of help with bathing/dressing/bathroom;Assistance with cooking/housework;Assist for transportation;Help with stairs or ramp for entrance   Can travel by private vehicle        Equipment Recommendations Wheelchair (measurements PT);Wheelchair cushion (measurements PT);BSC/3in1;Rolling walker (2 wheels)  Recommendations for Other Services  Rehab consult    Functional Status Assessment Patient has had a recent decline in their functional status and demonstrates the ability to make significant improvements in function in a reasonable and predictable amount of time.     Precautions / Restrictions Precautions Precautions: Fall Recall of Precautions/Restrictions: Intact Precaution/Restrictions Comments:  G-tube Restrictions Weight Bearing Restrictions Per Provider Order: No      Mobility  Bed Mobility Overal bed mobility: Needs Assistance Bed Mobility: Rolling, Sidelying to Sit Rolling: Mod assist Sidelying to sit: Max assist            Transfers Overall transfer level: Needs assistance Equipment used: 1 person hand held assist Transfers: Sit to/from Stand, Bed to chair/wheelchair/BSC Sit to Stand: Mod assist   Step pivot transfers: Mod assist            Ambulation/Gait                  Stairs            Wheelchair Mobility     Tilt Bed    Modified Rankin (Stroke Patients Only)       Balance Overall balance assessment: Needs assistance Sitting-balance support: No upper extremity supported, Feet supported Sitting balance-Leahy Scale: Fair     Standing balance support: Bilateral upper extremity supported, Reliant on assistive device for balance Standing balance-Leahy Scale: Poor                               Pertinent Vitals/Pain Pain Assessment Pain Assessment: 0-10 Pain Score: 10-Worst pain ever Pain Location: abdomen Pain Descriptors / Indicators: Sore Pain Intervention(s): Monitored during session    Home Living Family/patient expects to be discharged to:: Private residence Living Arrangements: Alone Available Help at Discharge: Neighbor;Available PRN/intermittently Type of Home: House Home Access: Stairs to enter Entrance Stairs-Rails: None Entrance Stairs-Number of Steps: 1   Home Layout: One level Home Equipment: Rollator (4 wheels);Cane - single point;BSC/3in1;Shower seat - built in;Grab bars - tub/shower      Prior Function Prior Level of Function : Independent/Modified Independent;Driving  Mobility Comments: ambulatory without DME       Extremity/Trunk Assessment   Upper Extremity Assessment Upper Extremity Assessment: Generalized weakness    Lower Extremity Assessment Lower  Extremity Assessment: Generalized weakness    Cervical / Trunk Assessment Cervical / Trunk Assessment: Other exceptions Cervical / Trunk Exceptions: G-tube, abdominal surgery  Communication   Communication Communication: No apparent difficulties    Cognition Arousal: Alert Behavior During Therapy: WFL for tasks assessed/performed   PT - Cognitive impairments: No apparent impairments                         Following commands: Intact       Cueing Cueing Techniques: Verbal cues     General Comments General comments (skin integrity, edema, etc.): pt reporting dizziness in sitting, BP after transfer is 144/63, pulse rate estimated at 117 by dynamap.    Exercises     Assessment/Plan    PT Assessment Patient needs continued PT services  PT Problem List Decreased strength;Decreased activity tolerance;Decreased balance;Decreased mobility;Decreased knowledge of use of DME;Decreased safety awareness;Decreased knowledge of precautions;Pain       PT Treatment Interventions DME instruction;Gait training;Stair training;Functional mobility training;Therapeutic activities;Therapeutic exercise;Balance training;Neuromuscular re-education;Patient/family education    PT Goals (Current goals can be found in the Care Plan section)  Acute Rehab PT Goals Patient Stated Goal: to return to independence PT Goal Formulation: With patient Time For Goal Achievement: 07/16/23 Potential to Achieve Goals: Fair    Frequency Min 3X/week     Co-evaluation               AM-PAC PT "6 Clicks" Mobility  Outcome Measure Help needed turning from your back to your side while in a flat bed without using bedrails?: A Lot Help needed moving from lying on your back to sitting on the side of a flat bed without using bedrails?: A Lot Help needed moving to and from a bed to a chair (including a wheelchair)?: A Lot Help needed standing up from a chair using your arms (e.g., wheelchair or bedside  chair)?: A Lot Help needed to walk in hospital room?: Total Help needed climbing 3-5 steps with a railing? : Total 6 Click Score: 10    End of Session   Activity Tolerance: Patient limited by fatigue;Patient limited by pain Patient left: in chair;with call bell/phone within reach;with chair alarm set Nurse Communication: Mobility status PT Visit Diagnosis: Other abnormalities of gait and mobility (R26.89);Muscle weakness (generalized) (M62.81)    Time: 4098-1191 PT Time Calculation (min) (ACUTE ONLY): 29 min   Charges:   PT Evaluation $PT Eval Moderate Complexity: 1 Mod   PT General Charges $$ ACUTE PT VISIT: 1 Visit         Rexie Catena, PT, DPT Acute Rehabilitation Office [phone]   Rexie Catena 07/02/2023, 3:03 PM

## 2023-07-02 NOTE — Care Management Important Message (Signed)
 Important Message  Patient Details  Name: Jade Cardenas MRN: [id number] Date of Birth: Jun 21, 1950   Important Message Given:  Yes - Medicare IM     Felix Host 07/02/2023, 12:09 PM

## 2023-07-02 NOTE — Progress Notes (Addendum)
 3 Days Post-Op   Subjective/Chief Complaint: Pt with some con't midline and chest pain s/p surgery and CPR. Min mobilizing   Objective: Vital signs in last 24 hours: Temp:  [98.1 F (36.7 C)-98.6 F (37 C)] 98.4 F (36.9 C) (05/02 0516) Pulse Rate:  [102-108] 104 (05/02 0516) Resp:  [17-20] 18 (05/02 0516) BP: (93-151)/(36-79) 151/79 (05/02 0516) SpO2:  [87 %-95 %] 93 % (05/02 0516) Last BM Date :  (pta)  Intake/Output from previous day: 05/01 0701 - 05/02 0700 In: 378 [P.O.:240; IV Piggyback:138] Out: 650 [Urine:650] Intake/Output this shift: No intake/output data recorded.  General appearance: alert and cooperative GI: soft, non-tender; bowel sounds normal; no masses,  no organomegaly and inc c/d/i Ext: Large LUE brusing 2/2 to artline, 2+ Radial pulses on that side Lab Results:  Recent Labs    07/01/23 2155 07/02/23 0428  WBC 16.0* 17.4*  HGB 7.0* 7.3*  HCT 20.9* 22.1*  PLT 123* 135*   BMET Recent Labs    07/01/23 2155 07/02/23 0428  NA 139 140  K 3.4* 3.4*  CL 106 106  CO2 24 25  GLUCOSE 140* 141*  BUN 26* 20  CREATININE 0.93 0.79  CALCIUM  8.3* 8.4*   PT/INR Recent Labs    06/29/23 1200 07/01/23 1148  LABPROT 19.4* 15.9*  INR 1.6* 1.3*   ABG Recent Labs    06/29/23 1023 06/29/23 1154  PHART 7.348* 7.379  HCO3 20.3 23.6    Studies/Results: No results found.  Anti-infectives: Anti-infectives (From admission, onward)    Start     Dose/Rate Route Frequency Ordered Stop   06/30/23 1300  ciprofloxacin  (CIPRO ) tablet 500 mg        500 mg Oral 2 times daily 06/30/23 1213 07/03/23 0759   06/29/23 0600  ceFAZolin  (ANCEF ) IVPB 2g/100 mL premix        2 g 200 mL/hr over 30 Minutes Intravenous On call to O.R. 06/29/23 0551 06/29/23 0800       Assessment/Plan: s/p Procedure(s) with comments: ATTEMPTED XI ROBOTIC ASSISTED HIATAL HERNIA REPAIR WITH FUNDOPLICATION AND MESH (N/A) - NO MESH INSERTED LAPAROTOMY, EXPLORATORY W/LIGATION SPLENIC  ARTERY AND GASTRIC TUBE PLACEMENT (N/A) POD3 -start urecholine  for urinary retention -encourage Po,  soft  -elev WBC likely 2/2 to splenic art ligation -PT -abx fo UTI -Mobilize  LOS: 3 days    Shela Derby 07/02/2023

## 2023-07-02 NOTE — Progress Notes (Signed)
 Pt POC Jade Cardenas called to request an update regarding her status. POC asked for lab results, x ray results, voiding and an overall summary of her current state.

## 2023-07-02 NOTE — Progress Notes (Signed)
 Pt had 211 ml via the bladder scan. She claims she feels the urge to possibly urinate now and will call out when she is ready.

## 2023-07-02 NOTE — Plan of Care (Signed)

## 2023-07-02 NOTE — Progress Notes (Signed)
 Physical Therapy Treatment Patient Details Name: Jade Cardenas MRN: [id number] DOB: 1951-02-02 Today's Date: 07/02/2023   History of Present Illness 73 y.o. female presents to [facility] hospital on 06/29/2023 with large hiatal hernia and Cameron's ulcers. Pt underwent attempted hiatal hernia repair, complicated by excessive bleeding and requiring laparotomy with ligation of splenic artery and G-tube placement. Pt requires chest compressions during procedure, remained intubated and on pressors after. Pt extubated on 4/30. PMH includes HLD, HTN, GERD, Lumbar fusion, psoriatic arthritis.    PT Comments  PT returns to assist pt back to bed. Pt demonstrates improvement in initiation of sit to stand. Pt continues to require significant physical assistance when performing bed mobility due to pain and poor core strength. Pt is declining pain medication despite reporting high levels of pain as she reports the pain medications are making her nauseous. If pain can be better controlled the patient will likely demonstrate improved tolerance for mobility. Patient will benefit from intensive inpatient follow-up therapy, >3 hours/day.    If plan is discharge home, recommend the following: A lot of help with walking and/or transfers;A lot of help with bathing/dressing/bathroom;Assistance with cooking/housework;Assist for transportation;Help with stairs or ramp for entrance   Can travel by private vehicle        Equipment Recommendations  Wheelchair (measurements PT);Wheelchair cushion (measurements PT);BSC/3in1;Rolling walker (2 wheels)    Recommendations for Other Services Rehab consult;OT consult     Precautions / Restrictions Precautions Precautions: Fall Recall of Precautions/Restrictions: Intact Precaution/Restrictions Comments: G-tube Restrictions Weight Bearing Restrictions Per Provider Order: No     Mobility  Bed Mobility Overal bed mobility: Needs Assistance Bed Mobility: Rolling, Sit to  Sidelying Rolling: Mod assist Sidelying to sit: Max assist     Sit to sidelying: Mod assist      Transfers Overall transfer level: Needs assistance Equipment used: 1 person hand held assist Transfers: Sit to/from Stand, Bed to chair/wheelchair/BSC Sit to Stand: Min assist   Step pivot transfers: Min assist            Ambulation/Gait                   Stairs             Wheelchair Mobility     Tilt Bed    Modified Rankin (Stroke Patients Only)       Balance Overall balance assessment: Needs assistance Sitting-balance support: No upper extremity supported, Feet supported Sitting balance-Leahy Scale: Fair     Standing balance support: Bilateral upper extremity supported, Reliant on assistive device for balance Standing balance-Leahy Scale: Poor                              Communication Communication Communication: No apparent difficulties  Cognition Arousal: Alert Behavior During Therapy: WFL for tasks assessed/performed   PT - Cognitive impairments: No apparent impairments                         Following commands: Intact      Cueing Cueing Techniques: Verbal cues  Exercises      General Comments General comments (skin integrity, edema, etc.): VSS on RA      Pertinent Vitals/Pain Pain Assessment Pain Assessment: 0-10 Pain Score: 10-Worst pain ever Pain Location: abdomen Pain Descriptors / Indicators: Sore Pain Intervention(s):  (pt does not want pain medication as she reports it makes her nauseous)    Home Living  Family/patient expects to be discharged to:: Private residence Living Arrangements: Alone Available Help at Discharge: Neighbor;Available PRN/intermittently Type of Home: House Home Access: Stairs to enter Entrance Stairs-Rails: None Entrance Stairs-Number of Steps: 1   Home Layout: One level Home Equipment: Rollator (4 wheels);Cane - single point;BSC/3in1;Shower seat - built in;Grab bars  - tub/shower      Prior Function            PT Goals (current goals can now be found in the care plan section) Acute Rehab PT Goals Patient Stated Goal: to return to independence PT Goal Formulation: With patient Time For Goal Achievement: 07/16/23 Potential to Achieve Goals: Fair Progress towards PT goals: Progressing toward goals    Frequency    Min 3X/week      PT Plan      Co-evaluation              AM-PAC PT "6 Clicks" Mobility   Outcome Measure  Help needed turning from your back to your side while in a flat bed without using bedrails?: A Lot Help needed moving from lying on your back to sitting on the side of a flat bed without using bedrails?: A Lot Help needed moving to and from a bed to a chair (including a wheelchair)?: A Little Help needed standing up from a chair using your arms (e.g., wheelchair or bedside chair)?: A Little Help needed to walk in hospital room?: Total Help needed climbing 3-5 steps with a railing? : Total 6 Click Score: 12    End of Session   Activity Tolerance: Treatment limited secondary to medical complications (Comment) (nausea) Patient left: in bed;with call bell/phone within reach;with bed alarm set;with family/visitor present Nurse Communication: Mobility status PT Visit Diagnosis: Other abnormalities of gait and mobility (R26.89);Muscle weakness (generalized) (M62.81)     Time: 1610-9604 PT Time Calculation (min) (ACUTE ONLY): 13 min  Charges:    $Therapeutic Activity: 8-22 mins PT General Charges $$ ACUTE PT VISIT: 1 Visit                     Rexie Catena, PT, DPT Acute Rehabilitation Office [phone]    Rexie Catena 07/02/2023, 3:08 PM

## 2023-07-02 NOTE — Plan of Care (Signed)
  Problem: Education: Goal: Knowledge of General Education information will improve Description: Including pain rating scale, medication(s)/side effects and non-pharmacologic comfort measures Outcome: Progressing   Problem: Clinical Measurements: Goal: Ability to maintain clinical measurements within normal limits will improve Outcome: Progressing   Problem: Activity: Goal: Risk for activity intolerance will decrease Outcome: Progressing   Problem: Nutrition: Goal: Adequate nutrition will be maintained Outcome: Progressing   Problem: Coping: Goal: Level of anxiety will decrease Outcome: Progressing   Problem: Elimination: Goal: Will not experience complications related to bowel motility Outcome: Progressing   Problem: Pain Managment: Goal: General experience of comfort will improve and/or be controlled Outcome: Progressing   Problem: Safety: Goal: Ability to remain free from injury will improve Outcome: Progressing

## 2023-07-02 NOTE — Progress Notes (Signed)
  Inpatient Rehab Admissions Coordinator :  Per therapy recommendations, patient was screened for CIR candidacy by Ottie Glazier RN MSN.  At this time patient appears to be a potential candidate for CIR. I will place a rehab consult per protocol for full assessment. Please call me with any questions.  Ottie Glazier RN MSN Admissions Coordinator [phone]

## 2023-07-03 ENCOUNTER — Inpatient Hospital Stay (HOSPITAL_COMMUNITY)

## 2023-07-03 LAB — CBC
HCT: 20.1 % — ABNORMAL LOW (ref 36.0–46.0)
HCT: 22.7 % — ABNORMAL LOW (ref 36.0–46.0)
Hemoglobin: 6.8 g/dL — CL (ref 12.0–15.0)
Hemoglobin: 7.3 g/dL — ABNORMAL LOW (ref 12.0–15.0)
MCH: 30.9 pg (ref 26.0–34.0)
MCH: 29.6 pg (ref 26.0–34.0)
MCHC: 33.8 g/dL (ref 30.0–36.0)
MCHC: 32.2 g/dL (ref 30.0–36.0)
MCV: 91.4 fL (ref 80.0–100.0)
MCV: 91.9 fL (ref 80.0–100.0)
Platelets: 186 10*3/uL (ref 150–400)
Platelets: 229 10*3/uL (ref 150–400)
RBC: 2.2 MIL/uL — ABNORMAL LOW (ref 3.87–5.11)
RBC: 2.47 MIL/uL — ABNORMAL LOW (ref 3.87–5.11)
RDW: 15.9 % — ABNORMAL HIGH (ref 11.5–15.5)
RDW: 15.8 % — ABNORMAL HIGH (ref 11.5–15.5)
WBC: 18.1 10*3/uL — ABNORMAL HIGH (ref 4.0–10.5)
WBC: 18.1 10*3/uL — ABNORMAL HIGH (ref 4.0–10.5)
nRBC: 0.6 % — ABNORMAL HIGH (ref 0.0–0.2)
nRBC: 0.6 % — ABNORMAL HIGH (ref 0.0–0.2)

## 2023-07-03 LAB — COMPREHENSIVE METABOLIC PANEL WITH GFR
ALT: 76 U/L — ABNORMAL HIGH (ref 0–44)
AST: 29 U/L (ref 15–41)
Albumin: 2.7 g/dL — ABNORMAL LOW (ref 3.5–5.0)
Alkaline Phosphatase: 56 U/L (ref 38–126)
Anion gap: 7 (ref 5–15)
BUN: 20 mg/dL (ref 8–23)
CO2: 25 mmol/L (ref 22–32)
Calcium: 8.2 mg/dL — ABNORMAL LOW (ref 8.9–10.3)
Chloride: 108 mmol/L (ref 98–111)
Creatinine, Ser: 0.7 mg/dL (ref 0.44–1.00)
GFR, Estimated: >60 mL/min (ref >60)
Glucose, Bld: 155 mg/dL — ABNORMAL HIGH (ref 70–99)
Potassium: 3.5 mmol/L (ref 3.5–5.1)
Sodium: 140 mmol/L (ref 135–145)
Total Bilirubin: 1.2 mg/dL (ref 0.0–1.2)
Total Protein: 5.5 g/dL — ABNORMAL LOW (ref 6.5–8.1)

## 2023-07-03 MED ORDER — FUROSEMIDE 10 MG/ML IJ SOLN
20.0000 mg | Freq: Once | INTRAMUSCULAR | Status: AC
  Administered 2023-07-03: 20 mg via INTRAVENOUS
  Filled 2023-07-03: qty 2

## 2023-07-03 MED ORDER — GUAIFENESIN-DM 100-10 MG/5ML PO SYRP
5.0000 mL | ORAL_SOLUTION | ORAL | Status: DC | PRN
  Administered 2023-07-03 – 2023-07-06 (×8): 5 mL via ORAL
  Filled 2023-07-03 (×8): qty 5

## 2023-07-03 MED ORDER — KCL IN DEXTROSE-NACL 20-5-0.45 MEQ/L-%-% IV SOLN
INTRAVENOUS | Status: DC
  Filled 2023-07-03: qty 1000

## 2023-07-03 NOTE — Progress Notes (Signed)
 Pt O2 was 87 and HR 128 BP 143/57. Made provider aware, advised to start O2 via nasal cannula per placed order. Pain assessment at that time was 7/10 and IV pain meds were given and tolerated well. Will continue to monitor, call bell in reach, bed at lowest setting and Pt in bed with eyes closed.

## 2023-07-03 NOTE — Plan of Care (Signed)
  Problem: Education: Goal: Knowledge of General Education information will improve Description: Including pain rating scale, medication(s)/side effects and non-pharmacologic comfort measures Outcome: Progressing   Problem: Clinical Measurements: Goal: Ability to maintain clinical measurements within normal limits will improve Outcome: Progressing   Problem: Activity: Goal: Risk for activity intolerance will decrease Outcome: Progressing   Problem: Nutrition: Goal: Adequate nutrition will be maintained Outcome: Progressing   Problem: Coping: Goal: Level of anxiety will decrease Outcome: Progressing   Problem: Elimination: Goal: Will not experience complications related to bowel motility Outcome: Progressing   Problem: Skin Integrity: Goal: Risk for impaired skin integrity will decrease Outcome: Progressing

## 2023-07-03 NOTE — Plan of Care (Signed)

## 2023-07-03 NOTE — Progress Notes (Signed)
 POC Valente Gaskin called the unit to get an update on the Pt. Had questions for the MD and requested his presence via bedside. Made family aware of lab values, current status, urine output and respiratory interventions.

## 2023-07-03 NOTE — Progress Notes (Addendum)
 4 Days Post-Op   Subjective/Chief Complaint: Hg low this AM Cough   Objective: Vital signs in last 24 hours: Temp:  [97.6 F (36.4 C)-98.3 F (36.8 C)] 98.3 F (36.8 C) (05/03 0621) Pulse Rate:  [97-128] 97 (05/03 0621) Resp:  [17] 17 (05/03 0621) BP: (122-155)/(47-91) 155/66 (05/03 0621) SpO2:  [87 %-99 %] 99 % (05/03 0621) Last BM Date : 06/29/23  Intake/Output from previous day: 05/02 0701 - 05/03 0700 In: 748 [P.O.:98; I.V.:650] Out: 975 [Urine:975] Intake/Output this shift: No intake/output data recorded.  General appearance: alert and cooperative GI: soft, non-tender; bowel sounds normal; no masses,  no organomegaly and inc c/d/I no hematoma or cellulitis, g tube site clean and dry Ext: Large LUE brusing 2/2 to artline, 2+ Radial pulses on that side  Lab Results:  Recent Labs    07/02/23 0428 07/03/23 0445  WBC 17.4* 18.1*  HGB 7.3* 6.8*  HCT 22.1* 20.1*  PLT 135* 186   BMET Recent Labs    07/02/23 0428 07/03/23 0445  NA 140 140  K 3.4* 3.5  CL 106 108  CO2 25 25  GLUCOSE 141* 155*  BUN 20 20  CREATININE 0.79 0.70  CALCIUM  8.4* 8.2*   PT/INR Recent Labs    07/01/23 1148  LABPROT 15.9*  INR 1.3*   ABG No results for input(s): "PHART", "HCO3" in the last 72 hours.  Invalid input(s): "PCO2", "PO2"   Studies/Results: No results found.  Anti-infectives: Anti-infectives (From admission, onward)    Start     Dose/Rate Route Frequency Ordered Stop   06/30/23 1300  ciprofloxacin  (CIPRO ) tablet 500 mg        500 mg Oral 2 times daily 06/30/23 1213 07/02/23 2027   06/29/23 0600  ceFAZolin  (ANCEF ) IVPB 2g/100 mL premix        2 g 200 mL/hr over 30 Minutes Intravenous On call to O.R. 06/29/23 0551 06/29/23 0800       Assessment/Plan: s/p Procedure(s) with comments: ATTEMPTED XI ROBOTIC ASSISTED HIATAL HERNIA REPAIR WITH FUNDOPLICATION AND MESH (N/A) - NO MESH INSERTED LAPAROTOMY, EXPLORATORY W/LIGATION SPLENIC ARTERY AND GASTRIC TUBE  PLACEMENT (N/A) POD4 -start urecholine  for urinary retention -encourage Po,  soft  -elev WBC likely 2/2 to splenic art ligation -ABLA- has been around 7 last sveral days and now 6.8; stable; will repeat labs but may benefit from transfusion if still <7 -Check CXR, needs pulm toilet does not have IS, may benefit from diuresis at this point pending plain films PT -abx fo UTI -Mobilize  LOS: 4 days    Adalberto Acton 07/03/2023

## 2023-07-03 NOTE — Progress Notes (Signed)
 OT Cancellation Note  Patient Details Name: Jade Cardenas MRN: [id number] DOB: 1950-09-09   Cancelled Treatment:    Reason Eval/Treat Not Completed: (P) Fatigue/lethargy limiting ability to participate, Pt c/o pain/fatigue, asked to return tomorrow.   Scherry Curtis 07/03/2023, 3:50 PM

## 2023-07-04 LAB — COMPREHENSIVE METABOLIC PANEL WITH GFR
ALT: 58 U/L — ABNORMAL HIGH (ref 0–44)
AST: 24 U/L (ref 15–41)
Albumin: 2.4 g/dL — ABNORMAL LOW (ref 3.5–5.0)
Alkaline Phosphatase: 47 U/L (ref 38–126)
Anion gap: 6 (ref 5–15)
BUN: 21 mg/dL (ref 8–23)
CO2: 25 mmol/L (ref 22–32)
Calcium: 7.8 mg/dL — ABNORMAL LOW (ref 8.9–10.3)
Chloride: 107 mmol/L (ref 98–111)
Creatinine, Ser: 0.68 mg/dL (ref 0.44–1.00)
GFR, Estimated: >60 mL/min (ref >60)
Glucose, Bld: 157 mg/dL — ABNORMAL HIGH (ref 70–99)
Potassium: 3.7 mmol/L (ref 3.5–5.1)
Sodium: 138 mmol/L (ref 135–145)
Total Bilirubin: 1.2 mg/dL (ref 0.0–1.2)
Total Protein: 4.9 g/dL — ABNORMAL LOW (ref 6.5–8.1)

## 2023-07-04 LAB — CBC
HCT: 18.3 % — ABNORMAL LOW (ref 36.0–46.0)
Hemoglobin: 6.1 g/dL — CL (ref 12.0–15.0)
MCH: 30.5 pg (ref 26.0–34.0)
MCHC: 33.3 g/dL (ref 30.0–36.0)
MCV: 91.5 fL (ref 80.0–100.0)
Platelets: 221 10*3/uL (ref 150–400)
RBC: 2 MIL/uL — ABNORMAL LOW (ref 3.87–5.11)
RDW: 15.8 % — ABNORMAL HIGH (ref 11.5–15.5)
WBC: 14.5 10*3/uL — ABNORMAL HIGH (ref 4.0–10.5)
nRBC: 1.2 % — ABNORMAL HIGH (ref 0.0–0.2)

## 2023-07-04 LAB — PREPARE RBC (CROSSMATCH)

## 2023-07-04 MED ORDER — WHITE PETROLATUM EX OINT
TOPICAL_OINTMENT | CUTANEOUS | Status: DC | PRN
Start: 2023-07-04 — End: 2023-07-06
  Filled 2023-07-04: qty 28.35

## 2023-07-04 MED ORDER — ACETAMINOPHEN 500 MG PO TABS
1000.0000 mg | ORAL_TABLET | Freq: Four times a day (QID) | ORAL | Status: DC
  Administered 2023-07-04 – 2023-07-06 (×5): 1000 mg via ORAL
  Filled 2023-07-04 (×8): qty 2

## 2023-07-04 MED ORDER — PREDNISONE 20 MG PO TABS: 50.0000 mg | ORAL_TABLET | Freq: Four times a day (QID) | ORAL | Status: DC

## 2023-07-04 MED ORDER — DIPHENHYDRAMINE HCL 50 MG/ML IJ SOLN: 50.0000 mg | Freq: Once | INTRAMUSCULAR | Status: DC

## 2023-07-04 MED ORDER — DIPHENHYDRAMINE HCL 25 MG PO CAPS
25.0000 mg | ORAL_CAPSULE | Freq: Four times a day (QID) | ORAL | Status: DC | PRN
  Administered 2023-07-04 – 2023-07-05 (×3): 25 mg via ORAL
  Filled 2023-07-04 (×3): qty 1

## 2023-07-04 MED ORDER — GABAPENTIN 100 MG PO CAPS
100.0000 mg | ORAL_CAPSULE | Freq: Three times a day (TID) | ORAL | Status: DC
  Administered 2023-07-04 (×3): 100 mg via ORAL
  Filled 2023-07-04 (×4): qty 1

## 2023-07-04 MED ORDER — METHYLPREDNISOLONE SODIUM SUCC 40 MG IJ SOLR
40.0000 mg | Freq: Once | INTRAMUSCULAR | Status: AC
  Administered 2023-07-04: 40 mg via INTRAVENOUS
  Filled 2023-07-04: qty 1

## 2023-07-04 MED ORDER — BISACODYL 10 MG RE SUPP: 10.0000 mg | Freq: Every day | RECTAL | Status: DC | PRN

## 2023-07-04 MED ORDER — BISACODYL 10 MG RE SUPP
10.0000 mg | Freq: Once | RECTAL | Status: AC
  Administered 2023-07-04: 10 mg via RECTAL
  Filled 2023-07-04: qty 1

## 2023-07-04 MED ORDER — DIPHENHYDRAMINE HCL 25 MG PO CAPS: 50.0000 mg | ORAL_CAPSULE | Freq: Once | ORAL | Status: AC

## 2023-07-04 MED ORDER — DIPHENHYDRAMINE HCL 25 MG PO CAPS: 50.0000 mg | ORAL_CAPSULE | Freq: Once | ORAL | Status: DC

## 2023-07-04 MED ORDER — SODIUM CHLORIDE 0.9% IV SOLUTION: Freq: Once | INTRAVENOUS | Status: AC

## 2023-07-04 MED ORDER — DIPHENHYDRAMINE HCL 50 MG/ML IJ SOLN: 50.0000 mg | Freq: Once | INTRAMUSCULAR | Status: AC

## 2023-07-04 MED ORDER — ORAL CARE MOUTH RINSE: 15.0000 mL | OROMUCOSAL | Status: DC | PRN

## 2023-07-04 MED ORDER — OXYCODONE HCL 5 MG PO TABS
5.0000 mg | ORAL_TABLET | Freq: Four times a day (QID) | ORAL | Status: DC | PRN
  Administered 2023-07-06: 5 mg via ORAL
  Filled 2023-07-04 (×2): qty 1

## 2023-07-04 MED ORDER — METHOCARBAMOL 1000 MG/10ML IJ SOLN
500.0000 mg | Freq: Three times a day (TID) | INTRAMUSCULAR | Status: DC
  Administered 2023-07-04 – 2023-07-05 (×6): 500 mg via INTRAVENOUS
  Filled 2023-07-04 (×8): qty 10

## 2023-07-04 MED ORDER — FUROSEMIDE 10 MG/ML IJ SOLN
20.0000 mg | Freq: Two times a day (BID) | INTRAMUSCULAR | Status: AC
  Administered 2023-07-04 – 2023-07-05 (×3): 20 mg via INTRAVENOUS
  Filled 2023-07-04 (×3): qty 2

## 2023-07-04 MED ORDER — BOOST / RESOURCE BREEZE PO LIQD CUSTOM
1.0000 | Freq: Three times a day (TID) | ORAL | Status: DC
  Administered 2023-07-04 – 2023-07-06 (×5): 1 via ORAL

## 2023-07-04 MED ORDER — TRAMADOL HCL 50 MG PO TABS: 50.0000 mg | ORAL_TABLET | Freq: Four times a day (QID) | ORAL | Status: DC | PRN

## 2023-07-04 MED ORDER — HYDROMORPHONE HCL 1 MG/ML IJ SOLN
1.0000 mg | INTRAMUSCULAR | Status: DC | PRN
  Administered 2023-07-04 – 2023-07-05 (×6): 1 mg via INTRAVENOUS
  Filled 2023-07-04 (×7): qty 1

## 2023-07-04 NOTE — Progress Notes (Signed)
 Inpatient Rehab Admissions:  Inpatient Rehab Consult received.  I met with patient at the bedside for rehabilitation assessment and to discuss goals and expectations of an inpatient rehab admission.  Pt was lethargic but willing to discuss CIR. Discussed average length of stay and discharge home after completion of CIR. Pt acknowledged understanding but unsure if would like to pursue CIR. Will follow up at later date.  Signed: Artemus Larsen, MS, CCC-SLP Admissions Coordinator [phone]

## 2023-07-04 NOTE — Plan of Care (Signed)
  Problem: Education: Goal: Knowledge of General Education information will improve Description: Including pain rating scale, medication(s)/side effects and non-pharmacologic comfort measures Outcome: Progressing   Problem: Activity: Goal: Risk for activity intolerance will decrease Outcome: Progressing   Problem: Nutrition: Goal: Adequate nutrition will be maintained Outcome: Progressing   Problem: Coping: Goal: Level of anxiety will decrease Outcome: Progressing   Problem: Elimination: Goal: Will not experience complications related to bowel motility Outcome: Progressing   Problem: Pain Managment: Goal: General experience of comfort will improve and/or be controlled Outcome: Progressing

## 2023-07-04 NOTE — Progress Notes (Signed)
 Blood admin began, education provided and pt is tolerating it well at this time.

## 2023-07-04 NOTE — Progress Notes (Addendum)
 5 Days Post-Op   Subjective/Chief Complaint: Feels tired, pain, cough. Was in chair for 5 hours yesterday but has not walked since surgery No bowel movement since Monday before surgery  Objective: Vital signs in last 24 hours: Temp:  [97.5 F (36.4 C)-98.9 F (37.2 C)] 98.2 F (36.8 C) (05/04 0837) Pulse Rate:  [100-123] 108 (05/04 0837) Resp:  [16-20] 17 (05/04 0837) BP: (122-163)/(54-89) 146/89 (05/04 0837) SpO2:  [93 %-100 %] 97 % (05/04 0837) Last BM Date : 06/29/23  Intake/Output from previous day: 05/03 0701 - 05/04 0700 In: 643.9 [P.O.:240; I.V.:403.9] Out: 2465 [Urine:2465] Intake/Output this shift: No intake/output data recorded.  General appearance: alert and cooperative GI: soft, appropriately tender, mildly distended; bowel sounds normal; no masses,  no organomegaly and inc c/d/I no hematoma or cellulitis, g tube site clean and dry Ext: Large LUE brusing 2/2 to artline, 2+ Radial pulses on that side  Lab Results:  Recent Labs    07/03/23 1216 07/04/23 0329  WBC 18.1* 14.5*  HGB 7.3* 6.1*  HCT 22.7* 18.3*  PLT 229 221   BMET Recent Labs    07/03/23 0445 07/04/23 0329  NA 140 138  K 3.5 3.7  CL 108 107  CO2 25 25  GLUCOSE 155* 157*  BUN 20 21  CREATININE 0.70 0.68  CALCIUM  8.2* 7.8*   PT/INR Recent Labs    07/01/23 1148  LABPROT 15.9*  INR 1.3*   ABG No results for input(s): "PHART", "HCO3" in the last 72 hours.  Invalid input(s): "PCO2", "PO2"   Studies/Results: DG CHEST PORT 1 VIEW Result Date: 07/03/2023 CLINICAL DATA:  Cough.  Hiatal hernia. EXAM: PORTABLE CHEST 1 VIEW COMPARISON:  06/29/2023 FINDINGS: Low lung volumes. The cardio pericardial silhouette is enlarged. Bibasilar hazy opacity likely a component of superimposed soft tissue although atelectasis/infiltrate is a concern. No substantial pleural effusion. Bones are diffusely demineralized. IMPRESSION: Low lung volumes with bibasilar hazy opacity, likely a component of  superimposed soft tissue although atelectasis/infiltrate is a concern. Electronically Signed   By: Donnal Fusi M.D.   On: 07/03/2023 08:31    Anti-infectives: Anti-infectives (From admission, onward)    Start     Dose/Rate Route Frequency Ordered Stop   06/30/23 1300  ciprofloxacin  (CIPRO ) tablet 500 mg        500 mg Oral 2 times daily 06/30/23 1213 07/02/23 2027   06/29/23 0600  ceFAZolin  (ANCEF ) IVPB 2g/100 mL premix        2 g 200 mL/hr over 30 Minutes Intravenous On call to O.R. 06/29/23 0551 06/29/23 0800       Assessment/Plan: s/p Procedure(s) with comments: ATTEMPTED XI ROBOTIC ASSISTED HIATAL HERNIA REPAIR WITH FUNDOPLICATION AND MESH (N/A) - NO MESH INSERTED LAPAROTOMY, EXPLORATORY W/LIGATION SPLENIC ARTERY AND GASTRIC TUBE PLACEMENT (N/A) POD5 -start urecholine  for urinary retention -encourage Po,  soft, add ensure -elev WBC likely 2/2 to splenic art ligation -ABLA- has been around 7 last sveral days and now 6.1; 2 U PRBC today -needs pulm toilet, encouraged q1H IS -Repeat lasix today, stop IVF -Bowel regimen, ileus likely -sch robaxin , APAP, gabapentin , minimize narcotics  PT -abx fo UTI -Mobilize  LOS: 5 days    Adalberto Acton 07/04/2023

## 2023-07-04 NOTE — Evaluation (Signed)
 Occupational Therapy Evaluation Patient Details Name: Jade Cardenas MRN: [id number] DOB: 27-Mar-1950 Today's Date: 07/04/2023   History of Present Illness   73 y.o. female presents to [facility] on 06/29/2023 with large hiatal hernia and Cameron's ulcers. Pt underwent attempted hiatal hernia repair, complicated by excessive bleeding and requiring laparotomy with ligation of splenic artery and G-tube placement. Pt requires chest compressions during procedure, remained intubated and on pressors after. Pt extubated on 4/30. PMH includes HLD, HTN, GERD, Lumbar fusion, psoriatic arthritis.     Clinical Impressions Pt ind at baseline with ADLs/functional mobility, lives alone. Pt currently needing up to max A for ADLs, mod A for bed mobility and min A for pivot transfers with RW. Pt with successful BM on [facility], RN notified. Pt using pillow to brace abdomen during toileting and with bed mobility. SpO2 mid 90s on RA with activity. Pt presenting with impairments listed below, will follow acutely. Patient will benefit from intensive inpatient follow-up therapy, >3 hours/day to maximize safety/ind with ADL/functional mobility.      If plan is discharge home, recommend the following:   A lot of help with walking and/or transfers;A lot of help with bathing/dressing/bathroom;Assistance with cooking/housework;Direct supervision/assist for medications management;Direct supervision/assist for financial management;Assist for transportation;Help with stairs or ramp for entrance     Functional Status Assessment   Patient has had a recent decline in their functional status and demonstrates the ability to make significant improvements in function in a reasonable and predictable amount of time.     Equipment Recommendations   Other (comment) (defer)     Recommendations for Other Services   PT consult;Rehab consult     Precautions/Restrictions   Precautions Precautions: Fall Recall of  Precautions/Restrictions: Intact Precaution/Restrictions Comments: G-tube Restrictions Weight Bearing Restrictions Per Provider Order: No     Mobility Bed Mobility Overal bed mobility: Needs Assistance Bed Mobility: Rolling, Sit to Sidelying   Sidelying to sit: Mod assist     Sit to sidelying: Mod assist      Transfers Overall transfer level: Needs assistance Equipment used: Rolling walker (2 wheels) Transfers: Sit to/from Stand, Bed to chair/wheelchair/BSC Sit to Stand: Min assist     Step pivot transfers: Min assist            Balance Overall balance assessment: Needs assistance Sitting-balance support: No upper extremity supported, Feet supported Sitting balance-Leahy Scale: Fair     Standing balance support: Bilateral upper extremity supported, Reliant on assistive device for balance Standing balance-Leahy Scale: Poor                             ADL either performed or assessed with clinical judgement   ADL Overall ADL's : Needs assistance/impaired Eating/Feeding: Set up;Sitting   Grooming: Set up;Sitting   Upper Body Bathing: Moderate assistance;Sitting   Lower Body Bathing: Maximal assistance;Sit to/from stand;Sitting/lateral leans   Upper Body Dressing : Moderate assistance;Sitting;Standing   Lower Body Dressing: Maximal assistance;Sitting/lateral leans;Sit to/from stand   Toilet Transfer: Minimal assistance   Toileting- Clothing Manipulation and Hygiene: Maximal assistance       Functional mobility during ADLs: Minimal assistance;+2 for physical assistance       Vision Baseline Vision/History: 1 Wears glasses Vision Assessment?: No apparent visual deficits     Perception Perception: Not tested       Praxis Praxis: Not tested       Pertinent Vitals/Pain Pain Assessment Pain Assessment: Faces Pain Score: 8  Faces Pain Scale: Hurts whole lot Pain Location: abdomen Pain Descriptors / Indicators: Sore Pain  Intervention(s): Limited activity within patient's tolerance, Monitored during session, Repositioned     Extremity/Trunk Assessment Upper Extremity Assessment Upper Extremity Assessment: Generalized weakness   Lower Extremity Assessment Lower Extremity Assessment: Defer to PT evaluation   Cervical / Trunk Assessment Cervical / Trunk Assessment: Other exceptions   Communication Communication Communication: No apparent difficulties   Cognition Arousal: Alert Behavior During Therapy: WFL for tasks assessed/performed Cognition: No apparent impairments             OT - Cognition Comments: labile regarding current deficits                 Following commands: Intact       Cueing  General Comments   Cueing Techniques: Verbal cues  VSS   Exercises     Shoulder Instructions      Home Living Family/patient expects to be discharged to:: Private residence Living Arrangements: Alone Available Help at Discharge: Neighbor;Available PRN/intermittently Type of Home: House Home Access: Stairs to enter [employer] of Steps: 1 Entrance Stairs-Rails: None Home Layout: One level     Bathroom Shower/Tub: Producer, television/film/video: Standard Bathroom Accessibility: Yes   Home Equipment: Rollator (4 wheels);Cane - single point;BSC/3in1;Shower seat - built in;Grab bars - tub/shower          Prior Functioning/Environment Prior Level of Function : Independent/Modified Independent;Driving             Mobility Comments: ind ADLs Comments: ind    OT Problem List: Decreased strength;Decreased range of motion;Decreased activity tolerance;Impaired balance (sitting and/or standing);Decreased cognition;Decreased safety awareness   OT Treatment/Interventions: Self-care/ADL training;Therapeutic exercise;Energy conservation;DME and/or AE instruction;Therapeutic activities;Balance training;Patient/family education      OT Goals(Current goals can be  found in the care plan section)   Acute Rehab OT Goals Patient Stated Goal: none stated OT Goal Formulation: With patient Time For Goal Achievement: 07/18/23 Potential to Achieve Goals: Good ADL Goals Pt Will Perform Upper Body Dressing: with min assist;sitting Pt Will Perform Lower Body Dressing: with min assist;sitting/lateral leans;sit to/from stand;with adaptive equipment Pt Will Transfer to Toilet: with min assist;ambulating;regular height toilet Additional ADL Goal #1: pt will tolerate OOB activity x10 min in prep for ADLs   OT Frequency:  Min 2X/week    Co-evaluation              AM-PAC OT "6 Clicks" Daily Activity     Outcome Measure Help from another person eating meals?: A Little Help from another person taking care of personal grooming?: A Little Help from another person toileting, which includes using toliet, bedpan, or urinal?: A Lot Help from another person bathing (including washing, rinsing, drying)?: A Lot Help from another person to put on and taking off regular upper body clothing?: A Lot Help from another person to put on and taking off regular lower body clothing?: A Lot 6 Click Score: 14   End of Session Equipment Utilized During Treatment: Rolling walker (2 wheels) Nurse Communication: Mobility status  Activity Tolerance: Patient tolerated treatment well Patient left: in bed;with call bell/phone within reach;with bed alarm set  OT Visit Diagnosis: Unsteadiness on feet (R26.81);Other abnormalities of gait and mobility (R26.89);Muscle weakness (generalized) (M62.81)                Time: 1610-9604 OT Time Calculation (min): 41 min Charges:  OT General Charges $OT Visit: 1 Visit OT Evaluation $OT Eval Moderate  Complexity: 1 Mod OT Treatments $Self Care/Home Management : 23-37 mins  Nidya Bouyer K, OTD, OTR/L SecureChat Preferred Acute Rehab [phone]   Antionette Kirks 07/04/2023, 3:37 PM

## 2023-07-04 NOTE — Plan of Care (Signed)

## 2023-07-04 NOTE — Progress Notes (Signed)
 Made MD aware of Pt Hgb level of 6.1, he placed orders for PRBC.

## 2023-07-05 ENCOUNTER — Inpatient Hospital Stay (HOSPITAL_COMMUNITY)

## 2023-07-05 LAB — BASIC METABOLIC PANEL WITH GFR
Anion gap: 13 (ref 5–15)
BUN: 24 mg/dL — ABNORMAL HIGH (ref 8–23)
CO2: 22 mmol/L (ref 22–32)
Calcium: 8.9 mg/dL (ref 8.9–10.3)
Chloride: 103 mmol/L (ref 98–111)
Creatinine, Ser: 0.74 mg/dL (ref 0.44–1.00)
GFR, Estimated: >60 mL/min (ref >60)
Glucose, Bld: 219 mg/dL — ABNORMAL HIGH (ref 70–99)
Potassium: 4.4 mmol/L (ref 3.5–5.1)
Sodium: 138 mmol/L (ref 135–145)

## 2023-07-05 LAB — PROTIME-INR
INR: 1.1 (ref 0.8–1.2)
Prothrombin Time: 14.5 s (ref 11.4–15.2)

## 2023-07-05 LAB — CBC
HCT: 39 % (ref 36.0–46.0)
Hemoglobin: 13.4 g/dL (ref 12.0–15.0)
MCH: 29.9 pg (ref 26.0–34.0)
MCHC: 34.4 g/dL (ref 30.0–36.0)
MCV: 87.1 fL (ref 80.0–100.0)
Platelets: 272 10*3/uL (ref 150–400)
RBC: 4.48 MIL/uL (ref 3.87–5.11)
RDW: 16.6 % — ABNORMAL HIGH (ref 11.5–15.5)
WBC: 17.1 10*3/uL — ABNORMAL HIGH (ref 4.0–10.5)
nRBC: 0.5 % — ABNORMAL HIGH (ref 0.0–0.2)

## 2023-07-05 LAB — MAGNESIUM: Magnesium: 2 mg/dL (ref 1.7–2.4)

## 2023-07-05 MED ORDER — METHYLPREDNISOLONE SODIUM SUCC 40 MG IJ SOLR
40.0000 mg | Freq: Once | INTRAMUSCULAR | Status: AC
  Administered 2023-07-05: 40 mg via INTRAVENOUS
  Filled 2023-07-05: qty 1

## 2023-07-05 MED ORDER — IOHEXOL 350 MG/ML SOLN
75.0000 mL | Freq: Once | INTRAVENOUS | Status: AC | PRN
  Administered 2023-07-05: 75 mL via INTRAVENOUS

## 2023-07-05 NOTE — Progress Notes (Signed)
 Physical Therapy Treatment Patient Details Name: Jade Cardenas MRN: [id number] DOB: 1950-04-01 Today's Date: 07/05/2023   History of Present Illness 73 y.o. female presents to [facility] on 06/29/2023 with large hiatal hernia and Cameron's ulcers. Pt underwent attempted hiatal hernia repair, complicated by excessive bleeding and requiring laparotomy with ligation of splenic artery and G-tube placement. Pt requires chest compressions during procedure, remained intubated and on pressors after. Pt extubated on 4/30. PMH includes HLD, HTN, GERD, Lumbar fusion, psoriatic arthritis.    PT Comments  Pt making steady progress with all mobility. Able to amb with assist in room today. Very motivated to return to independence. Able to tolerate incr activity. Patient will benefit from intensive inpatient follow-up therapy, >3 hours/day.     If plan is discharge home, recommend the following: A lot of help with walking and/or transfers;A lot of help with bathing/dressing/bathroom;Assistance with cooking/housework;Assist for transportation;Help with stairs or ramp for entrance   Can travel by private vehicle        Equipment Recommendations  Rolling walker (2 wheels);BSC/3in1    Recommendations for Other Services       Precautions / Restrictions Precautions Precautions: Fall Recall of Precautions/Restrictions: Intact Restrictions Weight Bearing Restrictions Per Provider Order: No     Mobility  Bed Mobility Overal bed mobility: Needs Assistance Bed Mobility: Rolling, Sit to Sidelying Rolling: Min assist Sidelying to sit: Mod assist       General bed mobility comments: Assist to elevate trunk into sitting    Transfers Overall transfer level: Needs assistance Equipment used: Rolling walker (2 wheels) Transfers: Sit to/from Stand Sit to Stand: Min assist, From elevated surface           General transfer comment: Assist to power up. Verbal cues for hand placement     Ambulation/Gait Ambulation/Gait assistance: Min assist Gait Distance (Feet): 15 Feet Assistive device: Rolling walker (2 wheels) Gait Pattern/deviations: Step-through pattern, Decreased step length - right, Decreased step length - left, Decreased stride length Gait velocity: decr Gait velocity interpretation: <1.31 ft/sec, indicative of household ambulator   General Gait Details: Assist for balance and support.   Stairs             Wheelchair Mobility     Tilt Bed    Modified Rankin (Stroke Patients Only)       Balance Overall balance assessment: Needs assistance Sitting-balance support: No upper extremity supported, Feet supported Sitting balance-Leahy Scale: Fair     Standing balance support: Bilateral upper extremity supported, Reliant on assistive device for balance Standing balance-Leahy Scale: Poor Standing balance comment: walker and CGA for static standing                            Communication Communication Communication: No apparent difficulties  Cognition Arousal: Alert Behavior During Therapy: WFL for tasks assessed/performed   PT - Cognitive impairments: No apparent impairments                         Following commands: Intact      Cueing Cueing Techniques: Verbal cues  Exercises      General Comments General comments (skin integrity, edema, etc.): VSS on RA. SpO2 90% or greater      Pertinent Vitals/Pain Pain Assessment Pain Assessment: Faces Faces Pain Scale: Hurts little more Pain Location: abdomen Pain Descriptors / Indicators: Sore Pain Intervention(s): Limited activity within patient's tolerance, Repositioned, Monitored during session  Home Living                          Prior Function            PT Goals (current goals can now be found in the care plan section) Acute Rehab PT Goals Patient Stated Goal: to return to independence Progress towards PT goals: Progressing toward  goals    Frequency    Min 2X/week      PT Plan      Co-evaluation              AM-PAC PT "6 Clicks" Mobility   Outcome Measure  Help needed turning from your back to your side while in a flat bed without using bedrails?: A Little Help needed moving from lying on your back to sitting on the side of a flat bed without using bedrails?: A Lot Help needed moving to and from a bed to a chair (including a wheelchair)?: A Little Help needed standing up from a chair using your arms (e.g., wheelchair or bedside chair)?: A Little Help needed to walk in hospital room?: Total Help needed climbing 3-5 steps with a railing? : Total 6 Click Score: 13    End of Session   Activity Tolerance: Patient tolerated treatment well Patient left: in chair;with call bell/phone within reach;with family/visitor present Nurse Communication: Mobility status PT Visit Diagnosis: Other abnormalities of gait and mobility (R26.89);Muscle weakness (generalized) (M62.81)     Time: 4540-9811 PT Time Calculation (min) (ACUTE ONLY): 37 min  Charges:    $Gait Training: 23-37 mins PT General Charges $$ ACUTE PT VISIT: 1 Visit                     [facility] PT Acute Rehabilitation Services Office [phone]    Pura Browns [facility] 07/05/2023, 6:18 PM

## 2023-07-05 NOTE — Progress Notes (Addendum)
 Inpatient Rehab Admissions Coordinator:  Saw pt at bedside. A friend was also present. Reviewed CIR goals and expectations. Pt acknowledged understanding and is interested in pursuing CIR. Pt's friend is supportive. Pt and friend confirmed that friends will be able to provide support for pt after discharge.  Will continue to follow.  1453: Attempted to contact pt's friend Aden Agreste. Left a message awaiting return call.  1534: Received call from [facility]. She acknowledged understanding of CIR goals and expectations. She is supportive of pt pursuing CIR. She confirmed that she and pt's other friends will be able to provide 24/7 support for pt after discharge.   Artemus Larsen, MS, CCC-SLP Admissions Coordinator [phone]

## 2023-07-05 NOTE — Progress Notes (Signed)
 6 Days Post-Op   Subjective/Chief Complaint: Pt doing better this AM Reviewed the CT and Labs Pt with BM last night   Objective: Vital signs in last 24 hours: Temp:  [98 F (36.7 C)-98.6 F (37 C)] 98 F (36.7 C) (05/05 1242) Pulse Rate:  [83-97] 83 (05/05 0808) Resp:  [16-22] 19 (05/05 1242) BP: (113-160)/(46-85) 160/85 (05/05 1242) SpO2:  [96 %-99 %] 99 % (05/05 1242) Last BM Date : 07/04/23  Intake/Output from previous day: 05/04 0701 - 05/05 0700 In: 1012 [P.O.:360; Blood:652] Out: 1880 [Urine:1880] Intake/Output this shift: Total I/O In: 480 [P.O.:480] Out: 850 [Urine:850]  General appearance: alert and cooperative GI: soft, non-tender; bowel sounds normal; no masses,  no organomegaly and inc c/d/i  Lab Results:  Recent Labs    07/04/23 0329 07/05/23 0921  WBC 14.5* 17.1*  HGB 6.1* 13.4  HCT 18.3* 39.0  PLT 221 272   BMET Recent Labs    07/04/23 0329 07/05/23 0921  NA 138 138  K 3.7 4.4  CL 107 103  CO2 25 22  GLUCOSE 157* 219*  BUN 21 24*  CREATININE 0.68 0.74  CALCIUM  7.8* 8.9   PT/INR Recent Labs    07/05/23 0921  LABPROT 14.5  INR 1.1   ABG No results for input(s): "PHART", "HCO3" in the last 72 hours.  Invalid input(s): "PCO2", "PO2"  Studies/Results: CT Angio Abd/Pel w/ and/or w/o Result Date: 07/05/2023 CLINICAL DATA:  Lower GI bleed. Patient is postoperative day 5 status post robotic assisted hiatal hernia repair with fundoplication, exploratory laparotomy, splenic artery ligation, and gastrostomy tube placement. EXAM: CTA ABDOMEN AND PELVIS WITHOUT AND WITH CONTRAST TECHNIQUE: Multidetector CT imaging of the abdomen and pelvis was performed using the standard protocol during bolus administration of intravenous contrast. Multiplanar reconstructed images and MIPs were obtained and reviewed to evaluate the vascular anatomy. RADIATION DOSE REDUCTION: This exam was performed according to the departmental dose-optimization program which  includes automated exposure control, adjustment of the mA and/or kV according to patient size and/or use of iterative reconstruction technique. CONTRAST:  75mL OMNIPAQUE  IOHEXOL  350 MG/ML SOLN COMPARISON:  CT of the abdomen performed August 16, 2022 FINDINGS: VASCULAR Aorta: Mild atherosclerotic changes, no aneurysm. Celiac: The celiac artery is patent. The splenic artery is patent in the proximal segment with a central segment interruption. There is subsequent distal reconstitution of the splenic artery. There is also subtle foci of hyperemia within the ligation bed which are favored to represent enhancement of collateral arterial structures. SMA: Patent.  Accessory/replaced right hepatic artery. Renals: Single right renal artery with mild atherosclerotic change in the proximal segment. Single left renal artery with mild atherosclerotic change in the proximal segment. IMA: Patent. Inflow: Mild atherosclerotic changes bilaterally, patent. Proximal Outflow: Patent. Veins: Main portal vein is patent. Possible central interruption of the splenic vein. Review of the MIP images confirms the above findings. NON-VASCULAR Lower chest: There are small bilateral pleural effusions which are low in attenuation. In the presumed postsurgical bed, there is either a loculated central hemothorax or postoperative hematoma which measures approximately 8.3 x 5.4 cm on axial imaging, image 8 of CT series 14. fluid is present within the distal esophagus. Hepatobiliary: Liver is somewhat heterogeneous in appearance. There is a focal fluid collection anterior to the left hemi liver which measures approximately 1.5 x 4.4 cm and contains small foci of air. This is not clearly demonstrate rim enhancement. This also may be within the liver capsule. Postsurgical changes from cholecystectomy. Pancreas: Mildly heterogeneous  appearance of the pancreas. Spleen: The spleen is heterogeneous in appearance which is possibly sequelae of multifocal  infarction. Adrenals/Urinary Tract: Right upper pole renal cyst is not significantly changed. There is a small amount of fluid anterior to the left kidney which may be postsurgical. No hydronephrosis. Urinary bladder is decompressed. A urinary catheter is present. Stomach/Bowel: An enteric tube is present within the stomach. Postsurgical changes from hiatal hernia repair. A moderate volume of well-formed stool material is present within the colon. Colonic diverticulosis. Lymphatic: Nothing significant. Reproductive: Status post hysterectomy. No adnexal masses. Other: Postsurgical changes in the ventral abdominal wall. Mild stranding is present in the flank soft tissues. Musculoskeletal: Postsurgical changes from posterior spinal fusion of the lower lumbar spine. Associated degenerative spondylosis. IMPRESSION: 1. There is no evidence of arterial extravasation into the GI tract. 2. A posterior mediastinal hematoma is favored, likely within the surgical bed which measures approximately 8.3 x 5.4 cm and may account for the patient's decrease in hematocrit. 3. Postsurgical changes from hiatal hernia repair, laparotomy, and splenic artery ligation. Fluid is present within the distal esophagus which suggests flow limitation (possibly secondary to swelling/hematoma). 4. Partial splenic infarct. 5. A small fluid collection is present anterior to the left hemi liver which is indeterminate. This measures approximately 1.5 x 4.4 cm. Electronically Signed   By: Reagan Camera M.D.   On: 07/05/2023 12:22    Anti-infectives: Anti-infectives (From admission, onward)    Start     Dose/Rate Route Frequency Ordered Stop   06/30/23 1300  ciprofloxacin  (CIPRO ) tablet 500 mg        500 mg Oral 2 times daily 06/30/23 1213 07/02/23 2027   06/29/23 0600  ceFAZolin  (ANCEF ) IVPB 2g/100 mL premix        2 g 200 mL/hr over 30 Minutes Intravenous On call to O.R. 06/29/23 0551 06/29/23 0800       Assessment/Plan: s/p Procedure(s)  with comments: ATTEMPTED XI ROBOTIC ASSISTED HIATAL HERNIA REPAIR WITH FUNDOPLICATION AND MESH (N/A) - NO MESH INSERTED LAPAROTOMY, EXPLORATORY W/LIGATION SPLENIC ARTERY AND GASTRIC TUBE PLACEMENT (N/A) -DC foley -Hgb stable -CT with no acute bleeding -Mobilize -wean O2 as possibible -Plan for CIR possibly tomorrow.   LOS: 6 days    Shela Derby 07/05/2023

## 2023-07-05 NOTE — PMR Pre-admission (Signed)
 PMR Admission Coordinator Pre-Admission Assessment  Patient: Jade Cardenas is an 73 y.o., female MRN: [id number] DOB: Mar 04, 1950 Height: 5\' 5"  (165.1 cm) Weight: 95.3 kg  Insurance Information HMO:     PPO:      PCP:      IPA:      80/20: yes     OTHER:  PRIMARY: Medicare Part A and B      Policy#: [id number]      Subscriber: patient CM Name:       Phone#:      Fax#:  Pre-Cert#:       Employer:  Benefits:  Phone #: verified eligibility via [employer] on 07/05/23     Name:  Eff. Date: Part A and B effective 04/30/17     Deduct: $1,676      Out of Pocket Max: NA      Life Max: NA CIR: 100% coverage      SNF: 100% coverage for days 1-20, 80% coverage for days 21-100 Outpatient: 80% coverage     Co-Pay: 20% Home Health: 100% coverage      Co-Pay:  DME: 80% coverage     Co-Pay: 20% Providers: pt's choice SECONDARY: A[employer]      Policy#: [id number]     Phone#: [phone]  Financial Counselor:       Phone#:   The "Data Collection Information Summary" for patients in Inpatient Rehabilitation Facilities with attached "Privacy Act Statement-Health Care Records" was provided and verbally reviewed with: Patient  Emergency Contact Information Contact Information     Name Relation Home Work Mobile   Dave Erie   [phone]      Other Contacts     Name Relation Home Work Mobile   Reiss,Jackie Relative  [phone]   Yetta Helper   [phone]   Tapscott,Silvia Neighbor   [phone]       Current Medical History  Patient Admitting Diagnosis: debility s/p hiatal hernia repair History of Present Illness: Pt is a 73 year old female with medical hx significant for: hiatal hernia and Cameron's ulcers, HLD, HTN, GERD, lumbar fusion. Pt presented to [facility] on 06/29/23 for hiatal hernia repair by Dr. Melton Squires. Pt had a bleeding complication that required ligation of splenic artery and transfusion of 5 PRBC. Pt had cardiac arrest and received CPR. Pt  intubated. Pt extubated on 4/30. Placed on nasal cannula on 07/02/23 d/t oxygen saturations of 87%. Pt Hgb 6.1 on 5/4. Pt given 2 units PRBC. CT negative for acute bleeding. Therapy evaluations completed and CIR recommended d/t pt's deficits in functional mobility.     Patient's medical record from [facility] has been reviewed by the rehabilitation admission coordinator and physician.  Past Medical History  Past Medical History:  Diagnosis Date   Anemia    iron def   Arthritis    rheumatoid, psoriatic, osteoarthritis    Bone spur    RIGHT FOOT    Broken toe    Bruises easily    Cellulitis    Chronic back pain    COLONIC POLYPS, HX OF 02/09/2007   Cough, persistent 01/21/2012   poss from acei    DES exposure in utero, unknown    Femur fracture, left (HCC)    Fibromyalgia    doesn't require meds   GASTRIC ULCER, ACUTE, HEMORRHAGE, HX OF 02/09/2007   GERD (gastroesophageal reflux disease)    takes Nexium  daily   Goiter    H/O hiatal hernia    H/O: hysterectomy  when 73 yrs old   Headache    Occasional migraines with increased stress   Hx of seasonal allergies    takes Claritin  daily   HYPERLIPIDEMIA 12/29/2006   takes Lovastatin  nightly   HYPERTENSION 12/29/2006   takes Enalapril  daily   IBS 04/28/2007   Insomnia    related to pain;takes Flexeril  and Tylenol  PM nightly   Joint pain    Joint swelling    Neuropathy    small cell   Osteopenia    Palpitations 02/09/2007   PONV (postoperative nausea and vomiting)    NAUSEA   REDUCTION MAMMOPLASTY, HX OF 02/09/2007   Right bundle branch block 02/09/2007   S/P lumbar fusion 6 13 09/03/2011   L4 L5  posterior    SYNCOPE 12/07/2007   Tendonitis    RLE    UNS ADVRS EFF OTH RX MEDICINAL\T\BIOLOGICAL SBSTNC 02/09/2007    Has the patient had major surgery during 100 days prior to admission? Yes  Family History   family history includes Arthritis in her brother and mother; Colon cancer in her father and mother;  Diabetes in her brother and mother; Endometrial cancer in her mother; Heart disease in her father; Hyperlipidemia in her brother; Hypertension in her brother, maternal grandmother, and mother; Infertility in her brother; Other in an other family member; Stroke in her father and maternal grandmother.  Current Medications  Current Facility-Administered Medications:    acetaminophen  (TYLENOL ) tablet 1,000 mg, 1,000 mg, Oral, Q6H, Connor, Chelsea A, MD, 1,000 mg at 07/05/23 1320   bethanechol  (URECHOLINE ) tablet 10 mg, 10 mg, Oral, TID, Ramirez, Armando, MD, 10 mg at 07/05/23 1610   bisacodyl  (DULCOLAX) suppository 10 mg, 10 mg, Rectal, Daily PRN, Adalberto Acton, MD   Chlorhexidine  Gluconate Cloth 2 % PADS 6 each, 6 each, Topical, Daily, Shela Derby, MD, 6 each at 07/05/23 9604   diphenhydrAMINE  (BENADRYL ) capsule 25 mg, 25 mg, Oral, Q6H PRN, Adalberto Acton, MD, 25 mg at 07/05/23 0945   diphenhydrAMINE  (BENADRYL ) capsule 50 mg, 50 mg, Oral, Once **OR** diphenhydrAMINE  (BENADRYL ) injection 50 mg, 50 mg, Intravenous, Once, Stechschulte, Avon Boers, MD   docusate sodium  (COLACE) capsule 100 mg, 100 mg, Oral, BID, Mannam, Praveen, MD, 100 mg at 07/04/23 2123   feeding supplement (BOOST / RESOURCE BREEZE) liquid 1 Container, 1 Container, Oral, TID BM, Adalberto Acton, MD, 1 Container at 07/05/23 1322   gabapentin  (NEURONTIN ) capsule 100 mg, 100 mg, Oral, TID, Aldon Hung A, MD, 100 mg at 07/05/23 0945   guaiFENesin-dextromethorphan (ROBITUSSIN DM) 100-10 MG/5ML syrup 5 mL, 5 mL, Oral, Q4H PRN, Shela Derby, MD, 5 mL at 07/04/23 1211   hydrALAZINE  (APRESOLINE ) injection 10 mg, 10 mg, Intravenous, Q4H PRN, Ramirez, Armando, MD, 10 mg at 06/29/23 1350   HYDROmorphone  (DILAUDID ) injection 1 mg, 1 mg, Intravenous, Q4H PRN, Aldon Hung A, MD, 1 mg at 07/05/23 1006   labetalol  (NORMODYNE ) injection 10-20 mg, 10-20 mg, Intravenous, Q2H PRN, Shela Derby, MD, 10 mg at 06/29/23 1545   magic  mouthwash, 2 mL, Oral, TID, Stechschulte, Avon Boers, MD, 2 mL at 07/04/23 2124   methocarbamol  (ROBAXIN ) injection 500 mg, 500 mg, Intravenous, Q8H, Connor, Chelsea A, MD, 500 mg at 07/05/23 1320   ondansetron  (ZOFRAN -ODT) disintegrating tablet 4 mg, 4 mg, Oral, Q6H PRN, 4 mg at 07/01/23 1337 **OR** ondansetron  (ZOFRAN ) injection 4 mg, 4 mg, Intravenous, Q6H PRN, Shela Derby, MD, 4 mg at 07/04/23 2120   Oral care mouth rinse, 15 mL, Mouth Rinse, Q2H,  Shela Derby, MD, 15 mL at 07/05/23 1321   Oral care mouth rinse, 15 mL, Mouth Rinse, PRN, Shela Derby, MD   Oral care mouth rinse, 15 mL, Mouth Rinse, PRN, Shela Derby, MD   oxyCODONE  (Oxy IR/ROXICODONE ) immediate release tablet 5 mg, 5 mg, Oral, Q6H PRN, Adalberto Acton, MD   pantoprazole  (PROTONIX ) EC tablet 40 mg, 40 mg, Oral, BID, Mannam, Praveen, MD, 40 mg at 07/05/23 0945   traMADol  (ULTRAM ) tablet 50 mg, 50 mg, Oral, Q6H PRN, Adalberto Acton, MD   white petrolatum  (VASELINE) gel, , Topical, PRN, Shela Derby, MD  Patients Current Diet:  Diet Order             DIET SOFT Fluid consistency: Thin  Diet effective now                   Precautions / Restrictions Precautions Precautions: Fall Precaution/Restrictions Comments: G-tube Restrictions Weight Bearing Restrictions Per Provider Order: No   Has the patient had 2 or more falls or a fall with injury in the past year? No  Prior Activity Level Community (5-7x/wk): gets out of house daily; drives  Prior Functional Level Self Care: Did the patient need help bathing, dressing, using the toilet or eating? Independent  Indoor Mobility: Did the patient need assistance with walking from room to room (with or without device)? Independent  Stairs: Did the patient need assistance with internal or external stairs (with or without device)? Unknown (pt reports she avoids steps)  Functional Cognition: Did the patient need help planning regular tasks such as  shopping or remembering to take medications? Independent  Patient Information Are you of Hispanic, Latino/a,or Spanish origin?: A. No, not of Hispanic, Latino/a, or Spanish origin What is your race?: A. White Do you need or want an interpreter to communicate with a doctor or health care staff?: 0. No  Patient's Response To:  Health Literacy and Transportation Is the patient able to respond to health literacy and transportation needs?: Yes Health Literacy - How often do you need to have someone help you when you read instructions, pamphlets, or other written material from your doctor or pharmacy?: Never In the past 12 months, has lack of transportation kept you from medical appointments or from getting medications?: No In the past 12 months, has lack of transportation kept you from meetings, work, or from getting things needed for daily living?: No  Journalist, newspaper / Equipment Home Equipment: Occupational hygienist (4 wheels), Cane - single point, BSC/3in1, Shower seat - built in, [employer] - tub/shower  Prior Device Use: Indicate devices/aids used by the patient prior to current illness, exacerbation or injury?  Cane (uses occasionally)  Current Functional Level Cognition  Orientation Level: Oriented X4    Extremity Assessment (includes Sensation/Coordination)  Upper Extremity Assessment: Generalized weakness  Lower Extremity Assessment: Defer to PT evaluation    ADLs  Overall ADL's : Needs assistance/impaired Eating/Feeding: Set up, Sitting Grooming: Set up, Sitting Upper Body Bathing: Moderate assistance, Sitting Lower Body Bathing: Maximal assistance, Sit to/from stand, Sitting/lateral leans Upper Body Dressing : Moderate assistance, Sitting, Standing Lower Body Dressing: Maximal assistance, Sitting/lateral leans, Sit to/from stand Toilet Transfer: Minimal assistance Toileting- Clothing Manipulation and Hygiene: Maximal assistance Functional mobility during ADLs: Minimal assistance,  +2 for physical assistance    Mobility  Overal bed mobility: Needs Assistance Bed Mobility: Rolling, Sit to Sidelying Rolling: Mod assist Sidelying to sit: Mod assist Sit to sidelying: Mod assist    Transfers  Overall  transfer level: Needs assistance Equipment used: Rolling walker (2 wheels) Transfers: Sit to/from Stand, Bed to chair/wheelchair/BSC Sit to Stand: Min assist Bed to/from chair/wheelchair/BSC transfer type:: Step pivot Step pivot transfers: Min assist    Ambulation / Gait / Stairs / Wheelchair Mobility       Posture / Balance Balance Overall balance assessment: Needs assistance Sitting-balance support: No upper extremity supported, Feet supported Sitting balance-Leahy Scale: Fair Standing balance support: Bilateral upper extremity supported, Reliant on assistive device for balance Standing balance-Leahy Scale: Poor    Special needs/care consideration Skin Abrasion: arm/bilateral; Blister: groin/right; Ecchymosis: arm/bilateral; Erythema/Redness: arm/bilateral; Surgical Incision: abdomen;  Bladder incontinence, Urethral Catheter and Special service needs Drain: LUQ, 4 abdominal ports: left, right   Previous Home Environment (from acute therapy documentation) Living Arrangements: Alone Available Help at Discharge: Friend(s), Available 24 hours/day Type of Home: House Home Layout: One level Home Access: Stairs to enter Entrance Stairs-Rails: None Secretary/administrator of Steps: 1 Bathroom Shower/Tub: Health visitor: Handicapped height Bathroom Accessibility: Yes How Accessible: Accessible via walker Home Care Services: No  Discharge Living Setting Plans for Discharge Living Setting: Patient's home Type of Home at Discharge: House Discharge Home Layout: One level Discharge Home Access: Stairs to enter Entrance Stairs-Rails: None Entrance Stairs-Number of Steps: 1 Discharge Bathroom Shower/Tub: Walk-in shower Discharge Bathroom Toilet:  Handicapped height Discharge Bathroom Accessibility: Yes How Accessible: Accessible via walker Does the patient have any problems obtaining your medications?: No  Social/Family/Support Systems    Goals Patient/Family Goal for Rehab: PT/OT Supervision  Expected length of stay: 10-12 days Pt/Family Agrees to Admission and willing to participate: Yes Program Orientation Provided & Reviewed with Pt/Caregiver Including Roles  & Responsibilities: Yes  Decrease burden of Care through IP rehab admission: NA  Possible need for SNF placement upon discharge: Not anticipated  Patient Condition: I have reviewed medical records from [facility], spoken with CSW, and patient and friends. I met with patient at the bedside and discussed via phone for inpatient rehabilitation assessment.  Patient will benefit from ongoing PT and OT, can actively participate in 3 hours of therapy a day 5 days of the week, and can make measurable gains during the admission.  Patient will also benefit from the coordinated team approach during an Inpatient Acute Rehabilitation admission.  The patient will receive intensive therapy as well as Rehabilitation physician, nursing, social worker, and care management interventions.  Due to bladder management, safety, skin/wound care, disease management, medication administration, pain management, and patient education the patient requires 24 hour a day rehabilitation nursing.  The patient is currently Min-mod A  with mobility and basic ADLs.  Discharge setting and therapy post discharge at home with home health is anticipated.  Patient has agreed to participate in the Acute Inpatient Rehabilitation Program and will admit tomorrow.  Preadmission Screen Completed By:  Amiel Kalata, 07/05/2023 3:01 PM ______________________________________________________________________   Discussed status with Dr. Rachel Budds on 07/06/23 at 900 and received approval for admission today.  Admission  Coordinator:  Amiel Kalata, CCC-SLP, time 930/Date 07/06/23   Assessment/Plan: Diagnosis: debility Does the need for close, 24 hr/day Medical supervision in concert with the patient's rehab needs make it unreasonable for this patient to be served in a less intensive setting? Yes Co-Morbidities requiring supervision/potential complications: GIB, cardiac arrest, HTN Due to bladder management, bowel management, safety, skin/wound care, disease management, medication administration, pain management, and patient education, does the patient require 24 hr/day rehab nursing? Yes Does the patient  require coordinated care of a physician, rehab nurse, PT, OT to address physical and functional deficits in the context of the above medical diagnosis(es)? Yes Addressing deficits in the following areas: balance, endurance, locomotion, strength, transferring, bowel/bladder control, bathing, dressing, feeding, grooming, toileting, and psychosocial support Can the patient actively participate in an intensive therapy program of at least 3 hrs of therapy 5 days a week? Yes The potential for patient to make measurable gains while on inpatient rehab is excellent Anticipated functional outcomes upon discharge from inpatient rehab: supervision PT, supervision OT, n/a SLP Estimated rehab length of stay to reach the above functional goals is: 10-12 days Anticipated discharge destination: Home 10. Overall Rehab/Functional Prognosis: excellent   MD Signature: Rawland Caddy, MD, [facility] Physical Medicine & Rehabilitation Medical Director Rehabilitation Services 07/06/2023

## 2023-07-06 ENCOUNTER — Encounter: Payer: Self-pay | Admitting: Hematology

## 2023-07-06 ENCOUNTER — Inpatient Hospital Stay (HOSPITAL_COMMUNITY)

## 2023-07-06 ENCOUNTER — Other Ambulatory Visit: Payer: Self-pay

## 2023-07-06 ENCOUNTER — Inpatient Hospital Stay (HOSPITAL_COMMUNITY)
Admission: AD | Admit: 2023-07-06 | Discharge: 2023-07-15 | DRG: 945 | Disposition: A | Discharge status: Home Health | Source: Intra-hospital | Attending: Physical Medicine & Rehabilitation | Admitting: Physical Medicine & Rehabilitation

## 2023-07-06 ENCOUNTER — Encounter (HOSPITAL_COMMUNITY): Payer: Self-pay | Admitting: Physical Medicine & Rehabilitation

## 2023-07-06 ENCOUNTER — Other Ambulatory Visit (HOSPITAL_COMMUNITY): Payer: Self-pay

## 2023-07-06 DIAGNOSIS — Z8674 Personal history of sudden cardiac arrest: Secondary | ICD-10-CM

## 2023-07-06 DIAGNOSIS — K5901 Slow transit constipation: Secondary | ICD-10-CM | POA: Diagnosis not present

## 2023-07-06 DIAGNOSIS — I10 Essential (primary) hypertension: Secondary | ICD-10-CM | POA: Diagnosis not present

## 2023-07-06 DIAGNOSIS — L4050 Arthropathic psoriasis, unspecified: Secondary | ICD-10-CM | POA: Diagnosis present

## 2023-07-06 DIAGNOSIS — I82A13 Acute embolism and thrombosis of axillary vein, bilateral: Secondary | ICD-10-CM | POA: Diagnosis present

## 2023-07-06 DIAGNOSIS — R339 Retention of urine, unspecified: Secondary | ICD-10-CM | POA: Diagnosis present

## 2023-07-06 DIAGNOSIS — K920 Hematemesis: Secondary | ICD-10-CM | POA: Diagnosis present

## 2023-07-06 DIAGNOSIS — S27892D Contusion of other specified intrathoracic organs, subsequent encounter: Secondary | ICD-10-CM | POA: Diagnosis not present

## 2023-07-06 DIAGNOSIS — D649 Anemia, unspecified: Secondary | ICD-10-CM | POA: Diagnosis not present

## 2023-07-06 DIAGNOSIS — I82453 Acute embolism and thrombosis of peroneal vein, bilateral: Secondary | ICD-10-CM | POA: Diagnosis present

## 2023-07-06 DIAGNOSIS — I82432 Acute embolism and thrombosis of left popliteal vein: Secondary | ICD-10-CM | POA: Diagnosis present

## 2023-07-06 DIAGNOSIS — Z8261 Family history of arthritis: Secondary | ICD-10-CM | POA: Diagnosis not present

## 2023-07-06 DIAGNOSIS — Z90722 Acquired absence of ovaries, bilateral: Secondary | ICD-10-CM

## 2023-07-06 DIAGNOSIS — I110 Hypertensive heart disease with heart failure: Secondary | ICD-10-CM | POA: Diagnosis present

## 2023-07-06 DIAGNOSIS — R5381 Other malaise: Secondary | ICD-10-CM | POA: Diagnosis not present

## 2023-07-06 DIAGNOSIS — Z981 Arthrodesis status: Secondary | ICD-10-CM

## 2023-07-06 DIAGNOSIS — K5900 Constipation, unspecified: Secondary | ICD-10-CM | POA: Diagnosis present

## 2023-07-06 DIAGNOSIS — M797 Fibromyalgia: Secondary | ICD-10-CM | POA: Diagnosis present

## 2023-07-06 DIAGNOSIS — E441 Mild protein-calorie malnutrition: Secondary | ICD-10-CM

## 2023-07-06 DIAGNOSIS — Z9049 Acquired absence of other specified parts of digestive tract: Secondary | ICD-10-CM | POA: Diagnosis not present

## 2023-07-06 DIAGNOSIS — D62 Acute posthemorrhagic anemia: Secondary | ICD-10-CM | POA: Diagnosis present

## 2023-07-06 DIAGNOSIS — G4700 Insomnia, unspecified: Secondary | ICD-10-CM | POA: Diagnosis present

## 2023-07-06 DIAGNOSIS — R1319 Other dysphagia: Secondary | ICD-10-CM | POA: Diagnosis present

## 2023-07-06 DIAGNOSIS — R042 Hemoptysis: Secondary | ICD-10-CM | POA: Diagnosis not present

## 2023-07-06 DIAGNOSIS — R0781 Pleurodynia: Secondary | ICD-10-CM | POA: Diagnosis not present

## 2023-07-06 DIAGNOSIS — E162 Hypoglycemia, unspecified: Secondary | ICD-10-CM | POA: Diagnosis not present

## 2023-07-06 DIAGNOSIS — Z833 Family history of diabetes mellitus: Secondary | ICD-10-CM | POA: Diagnosis not present

## 2023-07-06 DIAGNOSIS — Z8049 Family history of malignant neoplasm of other genital organs: Secondary | ICD-10-CM

## 2023-07-06 DIAGNOSIS — I509 Heart failure, unspecified: Secondary | ICD-10-CM | POA: Diagnosis present

## 2023-07-06 DIAGNOSIS — G629 Polyneuropathy, unspecified: Secondary | ICD-10-CM | POA: Diagnosis present

## 2023-07-06 DIAGNOSIS — I82412 Acute embolism and thrombosis of left femoral vein: Secondary | ICD-10-CM | POA: Diagnosis present

## 2023-07-06 DIAGNOSIS — Z800 Family history of malignant neoplasm of digestive organs: Secondary | ICD-10-CM | POA: Diagnosis not present

## 2023-07-06 DIAGNOSIS — T148XXA Other injury of unspecified body region, initial encounter: Secondary | ICD-10-CM | POA: Diagnosis not present

## 2023-07-06 DIAGNOSIS — R059 Cough, unspecified: Secondary | ICD-10-CM | POA: Diagnosis not present

## 2023-07-06 DIAGNOSIS — Z83438 Family history of other disorder of lipoprotein metabolism and other lipidemia: Secondary | ICD-10-CM

## 2023-07-06 DIAGNOSIS — R197 Diarrhea, unspecified: Secondary | ICD-10-CM | POA: Diagnosis not present

## 2023-07-06 DIAGNOSIS — E611 Iron deficiency: Secondary | ICD-10-CM | POA: Diagnosis present

## 2023-07-06 DIAGNOSIS — R7989 Other specified abnormal findings of blood chemistry: Secondary | ICD-10-CM | POA: Diagnosis not present

## 2023-07-06 DIAGNOSIS — D72829 Elevated white blood cell count, unspecified: Secondary | ICD-10-CM | POA: Diagnosis not present

## 2023-07-06 DIAGNOSIS — I82441 Acute embolism and thrombosis of right tibial vein: Secondary | ICD-10-CM | POA: Diagnosis present

## 2023-07-06 DIAGNOSIS — Z823 Family history of stroke: Secondary | ICD-10-CM

## 2023-07-06 DIAGNOSIS — M7981 Nontraumatic hematoma of soft tissue: Secondary | ICD-10-CM | POA: Diagnosis present

## 2023-07-06 DIAGNOSIS — K219 Gastro-esophageal reflux disease without esophagitis: Secondary | ICD-10-CM | POA: Diagnosis not present

## 2023-07-06 DIAGNOSIS — E785 Hyperlipidemia, unspecified: Secondary | ICD-10-CM | POA: Diagnosis present

## 2023-07-06 DIAGNOSIS — Z90710 Acquired absence of both cervix and uterus: Secondary | ICD-10-CM

## 2023-07-06 DIAGNOSIS — Z8249 Family history of ischemic heart disease and other diseases of the circulatory system: Secondary | ICD-10-CM

## 2023-07-06 DIAGNOSIS — I824Y3 Acute embolism and thrombosis of unspecified deep veins of proximal lower extremity, bilateral: Secondary | ICD-10-CM | POA: Diagnosis not present

## 2023-07-06 DIAGNOSIS — E739 Lactose intolerance, unspecified: Secondary | ICD-10-CM | POA: Diagnosis present

## 2023-07-06 DIAGNOSIS — M7989 Other specified soft tissue disorders: Secondary | ICD-10-CM | POA: Diagnosis not present

## 2023-07-06 LAB — TYPE AND SCREEN
ABO/RH(D): B POS
Antibody Screen: NEGATIVE
Unit division: 0
Unit division: 0
Unit division: 0
Unit division: 0

## 2023-07-06 LAB — BPAM RBC
Blood Product Expiration Date: 202505282359
Blood Product Expiration Date: 202505082359
Blood Product Expiration Date: 202505212359
Blood Product Expiration Date: 202505262359
ISSUE DATE / TIME: 202505040513
ISSUE DATE / TIME: 202505040858
ISSUE DATE / TIME: 202505042135
ISSUE DATE / TIME: 202505050014
Unit Type and Rh: 202505282359
Unit Type and Rh: 7300
Unit Type and Rh: 7300
Unit Type and Rh: 7300
Unit Type and Rh: 9500

## 2023-07-06 LAB — GLUCOSE, CAPILLARY: Glucose-Capillary: 93 mg/dL (ref 70–99)

## 2023-07-06 MED ORDER — METHOTREXATE SODIUM CHEMO INJECTION 50 MG/2ML
7.5000 mg | INTRAMUSCULAR | Status: DC
Start: 2023-07-06 — End: 2023-07-14
  Administered 2023-07-06: 7.5 mg via SUBCUTANEOUS
  Filled 2023-07-06 (×2): qty 0.3

## 2023-07-06 MED ORDER — METHOTREXATE SODIUM CHEMO INJECTION 50 MG/2ML: 50.0000 mg/m2 | INTRAMUSCULAR | Status: DC

## 2023-07-06 MED ORDER — PROCHLORPERAZINE 25 MG RE SUPP: 12.5000 mg | Freq: Four times a day (QID) | RECTAL | Status: DC | PRN

## 2023-07-06 MED ORDER — BISACODYL 10 MG RE SUPP: 10.0000 mg | Freq: Every day | RECTAL | Status: DC | PRN

## 2023-07-06 MED ORDER — GOLIMUMAB 50 MG/0.5ML ~~LOC~~ SOAJ
50.0000 mg | SUBCUTANEOUS | Status: DC
  Administered 2023-07-09: 50 mg via SUBCUTANEOUS
  Filled 2023-07-06: qty 1

## 2023-07-06 MED ORDER — DOCUSATE SODIUM 100 MG PO CAPS: 100.0000 mg | ORAL_CAPSULE | Freq: Two times a day (BID) | ORAL | Status: DC

## 2023-07-06 MED ORDER — GOLIMUMAB 50 MG/0.5ML ~~LOC~~ SOAJ
50.0000 mg | SUBCUTANEOUS | Status: DC
Start: 2023-07-07 — End: 2023-07-06

## 2023-07-06 MED ORDER — ACETAMINOPHEN 325 MG PO TABS
325.0000 mg | ORAL_TABLET | ORAL | Status: DC | PRN
  Administered 2023-07-11 – 2023-07-15 (×4): 650 mg via ORAL
  Filled 2023-07-06 (×5): qty 2

## 2023-07-06 MED ORDER — METHOCARBAMOL 500 MG PO TABS
500.0000 mg | ORAL_TABLET | Freq: Three times a day (TID) | ORAL | 0 refills | Status: DC
Start: 2023-07-06 — End: 2023-07-15
  Filled 2023-07-06: qty 20, 7d supply, fill #0

## 2023-07-06 MED ORDER — GOLIMUMAB 50 MG/0.5ML ~~LOC~~ SOAJ: 50.0000 mg | SUBCUTANEOUS | Status: DC

## 2023-07-06 MED ORDER — METHOCARBAMOL 1000 MG/10ML IJ SOLN
500.0000 mg | Freq: Three times a day (TID) | INTRAMUSCULAR | Status: DC
  Administered 2023-07-06 – 2023-07-07 (×2): 500 mg via INTRAVENOUS
  Filled 2023-07-06 (×3): qty 10

## 2023-07-06 MED ORDER — ORAL CARE MOUTH RINSE: 15.0000 mL | OROMUCOSAL | Status: DC | PRN

## 2023-07-06 MED ORDER — BETHANECHOL CHLORIDE 10 MG PO TABS
10.0000 mg | ORAL_TABLET | Freq: Three times a day (TID) | ORAL | Status: DC
  Administered 2023-07-06 – 2023-07-15 (×27): 10 mg via ORAL
  Filled 2023-07-06 (×27): qty 1

## 2023-07-06 MED ORDER — PROCHLORPERAZINE EDISYLATE 10 MG/2ML IJ SOLN: 5.0000 mg | Freq: Four times a day (QID) | INTRAMUSCULAR | Status: DC | PRN

## 2023-07-06 MED ORDER — CHLORHEXIDINE GLUCONATE CLOTH 2 % EX PADS: 6.0000 | MEDICATED_PAD | Freq: Every day | CUTANEOUS | Status: DC

## 2023-07-06 MED ORDER — ONDANSETRON 4 MG PO TBDP: 4.0000 mg | ORAL_TABLET | Freq: Four times a day (QID) | ORAL | Status: DC | PRN

## 2023-07-06 MED ORDER — SENNOSIDES-DOCUSATE SODIUM 8.6-50 MG PO TABS
2.0000 | ORAL_TABLET | Freq: Every day | ORAL | Status: DC
  Administered 2023-07-06 – 2023-07-09 (×4): 2 via ORAL
  Filled 2023-07-06 (×4): qty 2

## 2023-07-06 MED ORDER — TRAZODONE HCL 50 MG PO TABS: 25.0000 mg | ORAL_TABLET | Freq: Every evening | ORAL | Status: DC | PRN

## 2023-07-06 MED ORDER — ENSURE ENLIVE PO LIQD
237.0000 mL | Freq: Three times a day (TID) | ORAL | Status: DC
  Administered 2023-07-09: 237 mL via ORAL

## 2023-07-06 MED ORDER — DIPHENHYDRAMINE HCL 25 MG PO CAPS: 25.0000 mg | ORAL_CAPSULE | Freq: Four times a day (QID) | ORAL | Status: DC | PRN

## 2023-07-06 MED ORDER — ACETAMINOPHEN 325 MG PO TABS
650.0000 mg | ORAL_TABLET | Freq: Four times a day (QID) | ORAL | Status: DC
  Administered 2023-07-06 – 2023-07-15 (×22): 650 mg via ORAL
  Filled 2023-07-06 (×32): qty 2

## 2023-07-06 MED ORDER — FLEET ENEMA RE ENEM: 1.0000 | ENEMA | Freq: Once | RECTAL | Status: DC | PRN

## 2023-07-06 MED ORDER — TRAZODONE HCL 50 MG PO TABS
25.0000 mg | ORAL_TABLET | Freq: Every evening | ORAL | Status: DC | PRN
  Administered 2023-07-06: 25 mg via ORAL
  Filled 2023-07-06: qty 1

## 2023-07-06 MED ORDER — TRAMADOL HCL 50 MG PO TABS: 50.0000 mg | ORAL_TABLET | Freq: Four times a day (QID) | ORAL | Status: DC | PRN

## 2023-07-06 MED ORDER — ONDANSETRON HCL 4 MG/2ML IJ SOLN: 4.0000 mg | Freq: Four times a day (QID) | INTRAMUSCULAR | Status: DC | PRN

## 2023-07-06 MED ORDER — DEXTROMETHORPHAN POLISTIREX ER 30 MG/5ML PO SUER
30.0000 mg | Freq: Two times a day (BID) | ORAL | Status: DC | PRN
  Administered 2023-07-06 – 2023-07-14 (×7): 30 mg via ORAL
  Filled 2023-07-06 (×10): qty 5

## 2023-07-06 MED ORDER — OXYCODONE HCL 5 MG PO TABS
5.0000 mg | ORAL_TABLET | Freq: Four times a day (QID) | ORAL | 0 refills | Status: DC | PRN
  Filled 2023-07-06: qty 30, 7d supply, fill #0

## 2023-07-06 MED ORDER — METHOTREXATE SODIUM CHEMO INJECTION 50 MG/2ML
50.0000 mg/m2 | INTRAMUSCULAR | Status: DC
Start: 2023-07-13 — End: 2023-07-06

## 2023-07-06 MED ORDER — OXYCODONE HCL 5 MG PO TABS
5.0000 mg | ORAL_TABLET | Freq: Four times a day (QID) | ORAL | Status: DC | PRN
  Administered 2023-07-06 – 2023-07-15 (×22): 5 mg via ORAL
  Filled 2023-07-06 (×24): qty 1

## 2023-07-06 MED ORDER — METHOTREXATE SODIUM CHEMO INJECTION 50 MG/2ML
7.5000 mg | INTRAMUSCULAR | Status: DC
Start: 2023-07-06 — End: 2023-07-06
  Filled 2023-07-06: qty 0.3

## 2023-07-06 MED ORDER — PANTOPRAZOLE SODIUM 40 MG PO TBEC
40.0000 mg | DELAYED_RELEASE_TABLET | Freq: Two times a day (BID) | ORAL | Status: DC
  Administered 2023-07-06 – 2023-07-09 (×6): 40 mg via ORAL
  Filled 2023-07-06 (×6): qty 1

## 2023-07-06 MED ORDER — LIDOCAINE HCL URETHRAL/MUCOSAL 2 % EX GEL: CUTANEOUS | Status: DC | PRN

## 2023-07-06 MED ORDER — WHITE PETROLATUM EX OINT: TOPICAL_OINTMENT | CUTANEOUS | Status: DC | PRN

## 2023-07-06 MED ORDER — PROCHLORPERAZINE MALEATE 5 MG PO TABS
5.0000 mg | ORAL_TABLET | Freq: Four times a day (QID) | ORAL | Status: DC | PRN
  Administered 2023-07-06: 10 mg via ORAL
  Filled 2023-07-06: qty 2

## 2023-07-06 MED ORDER — FOLIC ACID 1 MG PO TABS
1.0000 mg | ORAL_TABLET | Freq: Every day | ORAL | Status: DC
  Administered 2023-07-06 – 2023-07-15 (×9): 1 mg via ORAL
  Filled 2023-07-06 (×10): qty 1

## 2023-07-06 MED ORDER — METHOTREXATE SODIUM CHEMO INJECTION 50 MG/2ML: 7.5000 mg | INTRAMUSCULAR | Status: DC

## 2023-07-06 MED ORDER — GUAIFENESIN-DM 100-10 MG/5ML PO SYRP: 5.0000 mL | ORAL_SOLUTION | Freq: Four times a day (QID) | ORAL | Status: DC | PRN

## 2023-07-06 MED ORDER — BOOST / RESOURCE BREEZE PO LIQD CUSTOM: 1.0000 | Freq: Three times a day (TID) | ORAL | Status: DC

## 2023-07-06 MED ORDER — MAGIC MOUTHWASH
2.0000 mL | Freq: Three times a day (TID) | ORAL | Status: DC
  Administered 2023-07-06 – 2023-07-15 (×19): 2 mL via ORAL
  Filled 2023-07-06 (×29): qty 5

## 2023-07-06 MED ORDER — ALUM & MAG HYDROXIDE-SIMETH 200-200-20 MG/5ML PO SUSP
30.0000 mL | ORAL | Status: DC | PRN
  Administered 2023-07-09 – 2023-07-14 (×2): 30 mL via ORAL
  Filled 2023-07-06 (×3): qty 30

## 2023-07-06 NOTE — Progress Notes (Signed)
 Called report to CIR, RN not available to receive report at this time.

## 2023-07-06 NOTE — H&P (Addendum)
 Physical Medicine and Rehabilitation Admission H&P     CC: Functional deficits due to debility     HPI: Jade Cardenas. Jade Cardenas is a 73 year old female with history of  HTN, PUD, chronic LBP, RA, psoriatic arthritis, anemia, large HH with Cameron's ulcers and iron deficiency anemia with early satiety and reflux who was admitted on 06/29/23 for attempted Lap fundoplication by Dr. Melton Squires. Operative course complicated by significant bleeding in mid portion of stomach after stomach reduced and hiatus dissected. She required 2 rounds of CPR due to cardiac arrest, laparotomy with ligation of splenic artery, 5 units PRBC  and gastric tube placement. She was extubated by 04/30 and  required foley placement for urinary retention. Urecholine  added and foley d/c on 05/05. Elevated WBC felt to be reactive and treated with 3 day course of cipro  for probable UTI. Po intake has been poor and has required IV dextrose  due to hypoglycemic episodes.    SQ heparin  d/c due to recurrent drop in Hgb to 6.8 on 05/03 treated with 2 units PRBC. She has recurrent drop in hgb to 6.1 on 06/04 treated with 2 units. CTA A/P ordered which was negative for extravasation, revealing 8.3 X 5.4 cm posterior mediastinal hematoma likely within surgical bed, post surgical changes with fluid within distal esophagus and partial splenic infarct. Oxygen weaned off but she continues to have chest pain felt to be due to CPR. Intake remains poor.    Therapy consulted and patient noted to be limited by pain and  weakness.  Patient lives alone but has supportive friends who will assist after discharge. She was independent and driving PTA. She currently requires mod assist with ADLs and mod to min assist with mobility. CIR recommended due to functional decline.      Review of Systems  Constitutional:  Positive for malaise/fatigue. Negative for chills and fever.  HENT:  Negative for hearing loss.   Eyes:  Positive for blurred vision (does not have her  contacts in).  Respiratory:  Positive for cough (worse at nights).   Cardiovascular:  Positive for chest pain. Negative for leg swelling.  Gastrointestinal:  Positive for abdominal pain, constipation and heartburn.  Genitourinary:  Negative for frequency and urgency.  Musculoskeletal:  Positive for back pain, joint pain and myalgias.  Neurological:  Positive for sensory change (chronic RLE numbness). Negative for dizziness and headaches.  Psychiatric/Behavioral:  The patient is nervous/anxious and has insomnia (chronic).           Past Medical History:  Diagnosis Date   Anemia      iron def   Arthritis      rheumatoid, psoriatic, osteoarthritis    Bone spur      RIGHT FOOT    Broken toe     Bruises easily     Cellulitis     Chronic back pain     COLONIC POLYPS, HX OF 02/09/2007   Cough, persistent 01/21/2012    poss from acei    DES exposure in utero, unknown     Femur fracture, left (HCC)     Fibromyalgia      doesn't require meds   GASTRIC ULCER, ACUTE, HEMORRHAGE, HX OF 02/09/2007   GERD (gastroesophageal reflux disease)      takes Nexium  daily   Goiter     H/O hiatal hernia     H/O: hysterectomy      when 73 yrs old   Headache      Occasional migraines with increased stress  Hx of seasonal allergies      takes Claritin  daily   HYPERLIPIDEMIA 12/29/2006    takes Lovastatin  nightly   HYPERTENSION 12/29/2006    takes Enalapril  daily   IBS 04/28/2007   Insomnia      related to pain;takes Flexeril  and Tylenol  PM nightly   Joint pain     Joint swelling     Neuropathy      small cell   Osteopenia     Palpitations 02/09/2007   PONV (postoperative nausea and vomiting)      NAUSEA   REDUCTION MAMMOPLASTY, HX OF 02/09/2007   Right bundle branch block 02/09/2007   S/P lumbar fusion 6 13 09/03/2011    L4 L5  posterior    SYNCOPE 12/07/2007   Tendonitis      RLE    UNS ADVRS EFF OTH RX MEDICINAL\T\BIOLOGICAL SBSTNC 02/09/2007               Past Surgical  History:  Procedure Laterality Date   BACK SURGERY   12+yrs ago    Synovial Cyst removal   BACK SURGERY   08/2011    lumbar L4-L5 fusion, lamenectomy   bone spur   6-74yrs ago    right ankle   BUNIONECTOMY   10/03/2014    right foot   BUNIONECTOMY Left     CERVICAL FUSION        2 12    CHOLECYSTECTOMY   1996   HARDWARE REMOVAL Left 08/01/2018    Procedure: Left knee hardware removal;  Surgeon: Liliane Rei, MD;  Location: [facility];  Service: Orthopedics;  Laterality: Left;    LAPAROTOMY N/A 06/29/2023    Procedure: LAPAROTOMY, EXPLORATORY W/LIGATION SPLENIC ARTERY AND GASTRIC TUBE PLACEMENT;  Surgeon: Shela Derby, MD;  Location: [facility];  Service: General;  Laterality: N/A;   POSTERIOR CERVICAL LAMINECTOMY Left 07/30/2015    Procedure: Laminectomy and Foraminotomy - left - Cervical two -Cervical three;  Surgeon: Agustina Aldrich, MD;  Location: [facility];  Service: Neurosurgery;  Laterality: Left;   REDUCTION MAMMAPLASTY Bilateral 1989   TONSILLECTOMY        as a child   TOTAL ABDOMINAL HYSTERECTOMY W/ BILATERAL SALPINGOOPHORECTOMY   1986   TOTAL KNEE ARTHROPLASTY N/A 06/21/2017    Procedure: LEFT TOTAL KNEE ARTHROPLASTY AND RIGHT KNEE CORTISONE INJECTION;  Surgeon: Liliane Rei, MD;  Location: [facility];  Service: Orthopedics;  Laterality: N/A;   XI ROBOTIC ASSISTED HIATAL HERNIA REPAIR N/A 06/29/2023    Procedure: ATTEMPTED XI ROBOTIC ASSISTED HIATAL HERNIA REPAIR WITH FUNDOPLICATION AND MESH;  Surgeon: Shela Derby, MD;  Location: [facility];  Service: General;  Laterality: N/A;  NO MESH INSERTED               Family History  Problem Relation Age of Onset   Diabetes Mother     Colon cancer Mother          x2   Arthritis Mother     Hypertension Mother     Endometrial cancer Mother     Stroke Father     Heart disease Father     Colon cancer Father     Arthritis Brother     Hypertension Brother     Hyperlipidemia Brother     Infertility Brother     Diabetes Brother      Other Other          DISH brother   Hypertension Maternal Grandmother     Stroke Maternal Grandmother  Anesthesia problems Neg Hx     Hypotension Neg Hx     Malignant hyperthermia Neg Hx     Pseudochol deficiency Neg Hx            Social History:  Widowed--husband died a year ago and children live out of state.  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 She  reports that she has never smoked. She has never been exposed to tobacco smoke. She has never used smokeless tobacco. She reports that she does not drink alcohol and does not use drugs.     Allergies       Allergies  Allergen Reactions   Erythromycin Hives, Swelling and Other (See Comments)      All Mycins; throat swells shut and she gets "really hot"   Iodinated Contrast Media Shortness Of Breath and Swelling      CT contrast; throat swelling, difficulty breathing.  In [place]  "many years ago."   Ace Inhibitors Cough   Indomethacin Other (See Comments)   Keflex [Cephalexin] Hives   Macrobid  [Nitrofurantoin ] Other (See Comments)      dizziness                 Facility-Administered Medications Prior to Admission  Medication Dose Route Frequency Provider Last Rate Last Admin   dexamethasone  (DECADRON ) injection 4 mg  4 mg Intra-articular Once Wagoner, Matthew R, DPM                Medications Prior to Admission  Medication Sig Dispense Refill   acetaminophen  (TYLENOL ) 650 MG CR tablet Take 1,300 mg by mouth See admin instructions. Take 1300 mg in the morning and may take an additional 1300 mg dose as needed for pain       Calcium  Carb-Cholecalciferol  (CALCIUM +D3 PO) Take 1 tablet by mouth daily.       carboxymethylcellulose (REFRESH PLUS) 0.5 % SOLN Place 1 drop into both eyes as needed (dry eyes).       diphenhydramine -acetaminophen  (TYLENOL  PM) 25-500 MG TABS tablet Take 1 tablet by mouth at bedtime.       folic acid  (FOLVITE ) 1 MG tablet Take 1 tablet (1 mg total) by mouth daily. 90 tablet 3    losartan  (COZAAR ) 100 MG tablet Take 1 tablet (100 mg total) by mouth daily. 90 tablet 3   lovastatin  (MEVACOR ) 20 MG tablet Take 1 tablet (20 mg total) by mouth every evening. 90 tablet 3   methotrexate  50 MG/2ML injection INJECT 0.3ML INTO THE SKIN ONCE WEEKLY (Patient taking differently: Inject 50 mg/m2 into the skin once a week. INJECT 0.3ML INTO THE SKIN ONCE WEEKLY) 6 mL 0   Multiple Vitamins-Minerals (MULTIVITAMIN ADULTS PO) Take 1 tablet by mouth daily.        pantoprazole  (PROTONIX ) 40 MG tablet Take 40 mg by mouth 2 (two) times daily.       B-D ALLERGY  SYRINGE 1CC/28G 28G X 1/2" 1 ML MISC USE AS DIRECTED ONCE WEEKLY 12 each 3   SIMPONI  50 MG/0.5ML SOAJ INJECT 50MG  UNDER THE SKIN ONCE MONTHLY (Patient taking differently: Inject 50 mg into the skin once a week. INJECT 50MG  UNDER THE SKIN ONCE MONTHLY) 0.5 mL 4          Home: Home Living Family/patient expects to be discharged to:: Private residence Living Arrangements: Alone Available Help at Discharge: Friend(s), Available 24 hours/day Type of Home: House Home Access: Stairs to enter [employer] of Steps: 1 Entrance Stairs-Rails: None Home Layout: One level Bathroom  Shower/Tub: Health visitor: Handicapped height Bathroom Accessibility: Yes Home Equipment: Rollator (4 wheels), Cane - single point, BSC/3in1, Shower seat - built in, [employer] - tub/shower   Functional History: Prior Function Prior Level of Function : Independent/Modified Independent, Driving Mobility Comments: ind ADLs Comments: ind   Functional Status:  Mobility: Bed Mobility Overal bed mobility: Needs Assistance Bed Mobility: Rolling, Sit to Sidelying Rolling: Min assist Sidelying to sit: Mod assist Sit to sidelying: Mod assist General bed mobility comments: Assist to elevate trunk into sitting Transfers Overall transfer level: Needs assistance Equipment used: Rolling walker (2 wheels) Transfers: Sit to/from Stand Sit to  Stand: Min assist, From elevated surface Bed to/from chair/wheelchair/BSC transfer type:: Step pivot Step pivot transfers: Min assist General transfer comment: Assist to power up. Verbal cues for hand placement Ambulation/Gait Ambulation/Gait assistance: Min assist Gait Distance (Feet): 15 Feet Assistive device: Rolling walker (2 wheels) Gait Pattern/deviations: Step-through pattern, Decreased step length - right, Decreased step length - left, Decreased stride length General Gait Details: Assist for balance and support. Gait velocity: decr Gait velocity interpretation: <1.31 ft/sec, indicative of household ambulator   ADL: ADL Overall ADL's : Needs assistance/impaired Eating/Feeding: Set up, Sitting Grooming: Set up, Sitting Upper Body Bathing: Moderate assistance, Sitting Lower Body Bathing: Maximal assistance, Sit to/from stand, Sitting/lateral leans Upper Body Dressing : Moderate assistance, Sitting, Standing Lower Body Dressing: Maximal assistance, Sitting/lateral leans, Sit to/from stand Toilet Transfer: Minimal assistance Toileting- Clothing Manipulation and Hygiene: Maximal assistance Functional mobility during ADLs: Minimal assistance, +2 for physical assistance   Cognition: Cognition Orientation Level: Oriented X4 Cognition Arousal: Alert Behavior During Therapy: WFL for tasks assessed/performed     Blood pressure (!) 151/65, pulse 91, temperature (!) 96.8 F (36 C), temperature source Axillary, resp. rate 20, height 5\' 5"  (1.651 m), weight 95.3 kg, last menstrual period 03/02/1984, SpO2 95%. Physical Exam Vitals and nursing note reviewed.  Constitutional:      General: She is not in acute distress.    Appearance: Normal appearance.  HENT:     Head: Normocephalic.     Right Ear: External ear normal.     Left Ear: External ear normal.     Nose: Nose normal.     Mouth/Throat:     Mouth: Mucous membranes are moist.  Eyes:     Extraocular Movements: Extraocular  movements intact.     Conjunctiva/sclera: Conjunctivae normal.     Pupils: Pupils are equal, round, and reactive to light.  Cardiovascular:     Rate and Rhythm: Normal rate and regular rhythm.     Heart sounds: No murmur heard.    No gallop.     Comments: Chest wall tenderness Pulmonary:     Effort: Pulmonary effort is normal. No respiratory distress.     Breath sounds: No wheezing.  Abdominal:     General: There is no distension.     Palpations: Abdomen is soft.     Tenderness: There is no abdominal tenderness. There is no guarding.     Comments: Midline incision as well as multiple smaller incisions C/D/I with staples in place. Gastrostomy tube site with crusted blood--non tender.   Musculoskeletal:     Cervical back: Normal range of motion and neck supple.     Right lower leg: No edema.     Left lower leg: No edema.     Comments: Chronic arthritic changes in hands/fingers.   Skin:    General: Skin is warm and dry.  Comments: Ecchymotic area LUE at site of prior IV?    Neurological:     Mental Status: She is alert.     Comments: Alert and oriented x 3. Normal insight and awareness. Intact Memory. Normal language and speech. Cranial nerve exam unremarkable. MMT: BUE 4-/5 proximally to 4/5 distally. BLE 3+/5 HF, 4/5 KE and 4+/5 ADF/PF. Sensory exam normal for light touch and pain in all 4 limbs. No limb ataxia or cerebellar signs. No abnormal tone appreciated.  Aaron Aas    Psychiatric:        Mood and Affect: Mood normal.        Behavior: Behavior normal.     Comments: Anxious and frustrated.         Lab Results Last 48 Hours        Results for orders placed or performed during the hospital encounter of 06/29/23 (from the past 48 hours)  Prepare RBC (crossmatch)     Status: None    Collection Time: 07/04/23  8:55 PM  Result Value Ref Range    Order Confirmation          ORDER PROCESSED BY BLOOD BANK Performed at [facility], [address], [place], [place] [address]     Basic metabolic panel with GFR     Status: Abnormal    Collection Time: 07/05/23  9:21 AM  Result Value Ref Range    Sodium 138 135 - 145 mmol/L    Potassium 4.4 3.5 - 5.1 mmol/L    Chloride 103 98 - 111 mmol/L    CO2 22 22 - 32 mmol/L    Glucose, Bld 219 (H) 70 - 99 mg/dL      Comment: Glucose reference range applies only to samples taken after fasting for at least 8 hours.    BUN 24 (H) 8 - 23 mg/dL    Creatinine, Ser 4.69 0.44 - 1.00 mg/dL    Calcium  8.9 8.9 - 10.3 mg/dL    GFR, Estimated >62 >95 mL/min      Comment: (NOTE) Calculated using the CKD-EPI Creatinine Equation (2021)      Anion gap 13 5 - 15      Comment: Performed at [facility], [address] [place], [place] [address]  CBC     Status: Abnormal    Collection Time: 07/05/23  9:21 AM  Result Value Ref Range    WBC 17.1 (H) 4.0 - 10.5 K/uL    RBC 4.48 3.87 - 5.11 MIL/uL    Hemoglobin 13.4 12.0 - 15.0 g/dL      Comment: REPEATED TO VERIFY POST TRANSFUSION SPECIMEN      HCT 39.0 36.0 - 46.0 %    MCV 87.1 80.0 - 100.0 fL    MCH 29.9 26.0 - 34.0 pg    MCHC 34.4 30.0 - 36.0 g/dL    RDW 24.4 (H) 01.0 - 15.5 %    Platelets 272 150 - 400 K/uL    nRBC 0.5 (H) 0.0 - 0.2 %      Comment: Performed at [facility], [address] [place], [place] [address]  Magnesium      Status: None    Collection Time: 07/05/23  9:21 AM  Result Value Ref Range    Magnesium  2.0 1.7 - 2.4 mg/dL      Comment: Performed at [facility], [address] [place], [place] [address]  Protime-INR     Status: None    Collection  Time: 07/05/23  9:21 AM  Result Value Ref Range    Prothrombin Time 14.5 11.4 - 15.2 seconds    INR 1.1 0.8 - 1.2      Comment: (NOTE) INR goal varies based on device and disease states. Performed at [facility], [address], [place], [place] [address]         Imaging Results (Last 48 hours)  CT Angio Abd/Pel w/ and/or w/o Result Date: 07/05/2023 CLINICAL DATA:  Lower GI bleed.  Patient is postoperative day 5 status post robotic assisted hiatal hernia repair with fundoplication, exploratory laparotomy, splenic artery ligation, and gastrostomy tube placement. EXAM: CTA ABDOMEN AND PELVIS WITHOUT AND WITH CONTRAST TECHNIQUE: Multidetector CT imaging of the abdomen and pelvis was performed using the standard protocol during bolus administration of intravenous contrast. Multiplanar reconstructed images and MIPs were obtained and reviewed to evaluate the vascular anatomy. RADIATION DOSE REDUCTION: This exam was performed according to the departmental dose-optimization program which includes automated exposure control, adjustment of the mA and/or kV according to patient size and/or use of iterative reconstruction technique. CONTRAST:  75mL OMNIPAQUE  IOHEXOL  350 MG/ML SOLN COMPARISON:  CT of the abdomen performed August 16, 2022 FINDINGS: VASCULAR Aorta: Mild atherosclerotic changes, no aneurysm. Celiac: The celiac artery is patent. The splenic artery is patent in the proximal segment with a central segment interruption. There is subsequent distal reconstitution of the splenic artery. There is also subtle foci of hyperemia within the ligation bed which are favored to represent enhancement of collateral arterial structures. SMA: Patent.  Accessory/replaced right hepatic artery. Renals: Single right renal artery with mild atherosclerotic change in the proximal segment. Single left renal artery with mild atherosclerotic change in the proximal segment. IMA: Patent. Inflow: Mild atherosclerotic changes bilaterally, patent. Proximal Outflow: Patent. Veins: Main portal vein is patent. Possible central interruption of the splenic vein. Review of the MIP images confirms the above findings. NON-VASCULAR Lower chest: There are small bilateral pleural effusions which are low in attenuation. In the presumed postsurgical bed, there is either a loculated central hemothorax or postoperative hematoma which measures  approximately 8.3 x 5.4 cm on axial imaging, image 8 of CT series 14. fluid is present within the distal esophagus. Hepatobiliary: Liver is somewhat heterogeneous in appearance. There is a focal fluid collection anterior to the left hemi liver which measures approximately 1.5 x 4.4 cm and contains small foci of air. This is not clearly demonstrate rim enhancement. This also may be within the liver capsule. Postsurgical changes from cholecystectomy. Pancreas: Mildly heterogeneous appearance of the pancreas. Spleen: The spleen is heterogeneous in appearance which is possibly sequelae of multifocal infarction. Adrenals/Urinary Tract: Right upper pole renal cyst is not significantly changed. There is a small amount of fluid anterior to the left kidney which may be postsurgical. No hydronephrosis. Urinary bladder is decompressed. A urinary catheter is present. Stomach/Bowel: An enteric tube is present within the stomach. Postsurgical changes from hiatal hernia repair. A moderate volume of well-formed stool material is present within the colon. Colonic diverticulosis. Lymphatic: Nothing significant. Reproductive: Status post hysterectomy. No adnexal masses. Other: Postsurgical changes in the ventral abdominal wall. Mild stranding is present in the flank soft tissues. Musculoskeletal: Postsurgical changes from posterior spinal fusion of the lower lumbar spine. Associated degenerative spondylosis. IMPRESSION: 1. There is no evidence of arterial extravasation into the GI tract. 2. A posterior mediastinal hematoma is favored, likely within the surgical bed which measures approximately 8.3 x 5.4 cm and may account for the  patient's decrease in hematocrit. 3. Postsurgical changes from hiatal hernia repair, laparotomy, and splenic artery ligation. Fluid is present within the distal esophagus which suggests flow limitation (possibly secondary to swelling/hematoma). 4. Partial splenic infarct. 5. A small fluid collection is  present anterior to the left hemi liver which is indeterminate. This measures approximately 1.5 x 4.4 cm. Electronically Signed   By: Reagan Camera M.D.   On: 07/05/2023 12:22           Blood pressure (!) 151/65, pulse 91, temperature (!) 96.8 F (36 C), temperature source Axillary, resp. rate 20, height 5\' 5"  (1.651 m), weight 95.3 kg, last menstrual period 03/02/1984, SpO2 95%.   Medical Problem List and Plan: 1. Functional deficits secondary to debility after hiatal hernia repair/fundoplication/splenic artery repair and subsequent complications             -patient may shower             -ELOS/Goals: 10-12 days, supervision goals with PT and OT 2.  Antithrombotics: -DVT/anticoagulation:  Mechanical: Sequential compression devices, below knee Bilateral lower extremities due to hematoma/bleeding.              -antiplatelet therapy: N/A 3. Pain Management: Oxycodone  prn severe pain and ultram  prn moderate pain             --decrease tylenol  to 650 mg qid.  4. Mood/Behavior/Sleep: LCSW to follow for evaluation and support.             --team to provide encouragement/ego support.              -antipsychotic agents: N/A 5. Neuropsych/cognition: This patient is capable of making decisions on her own behalf. 6. Skin/Wound Care: Routine pressure relief measures. Monitor incisions for healing. No dressings needed 7. Fluids/Electrolytes/Nutrition: Monitor I/O. Change breeze to ensure to avoid GERD             --friends to bring in food/supplements from home             --will continue to monitor for hypoglycemic episode.  8. Acute on chronic anemia: Has had rise in H/H from 6.1-->13.4 question accurate. Transfused with 4 units post op. --Will recheck in am  --was getting Iron infusions by Dr. Salomon Cree.  9. Mediastinal hematoma: Had episode of hematemesis vs hemoptysis last night (isolated) --Will check CXR for follow up.  **CXR reviewed, ?CHF, has EF of 65%, chest clear on exam  -follow weights.  Monitor clinically for now  -she's already a little dry by BUN  -re check labs in AM 10. Chest wall pain: Likely due to CPR. Continue to encourage deep breathing/cough every hour 11. Persistent leucocytosis: Monitor for fevers and signs of infection             --WBC has trended in 14-18 range.              -afebrile at present Episode of Hematemesis/Nocturnal cough: Continues to have chest pain 12. Constipation: Has not had BM since last week?Aaron Aas Moderate stool noted in colon on X rays.  and relays abdominal fullness.               --will add senna to colace              -does realize that her po intake needs to pick up as well 13. Urinary retention:  Has voided twice since foley came out yesterday. Needs to drink more             --  will monitor voiding with bladder scans. 14. Pre-renal azotemia: Encourage fluid/po intake.  15. RA/Psoriatic arthritis: Reports diffuse pain for past month (due to need to hold meds pre-op)   --Has been cleared to resume Methotrexate  (today) and Simponi  (friend to bring in tomorrow)     Zelda Hickman, PA-C 07/06/2023  I have personally performed a face to face diagnostic evaluation of this patient and formulated the key components of the plan.  Additionally, I have personally reviewed laboratory data, imaging studies, as well as relevant notes and concur with the physician assistant's documentation above.  The patient's status has not changed from the original H&P.  Any changes in documentation from the acute care chart have been noted above.  Rawland Caddy, MD, [facility]

## 2023-07-06 NOTE — Progress Notes (Signed)
 PMR Admission Coordinator Pre-Admission Assessment   Patient: Jade Cardenas is an 73 y.o., female MRN: [id number] DOB: 22-Jun-1950 Height: 5\' 5"  (165.1 cm) Weight: 95.3 kg   Insurance Information HMO:     PPO:      PCP:      IPA:      80/20: yes     OTHER:  PRIMARY: Medicare Part A and B      Policy#: [id number]      Subscriber: patient CM Name:       Phone#:      Fax#:  Pre-Cert#:       Employer:  Benefits:  Phone #: verified eligibility via [employer] on 07/05/23     Name:  Eff. Date: Part A and B effective 04/30/17     Deduct: $1,676      Out of Pocket Max: NA      Life Max: NA CIR: 100% coverage      SNF: 100% coverage for days 1-20, 80% coverage for days 21-100 Outpatient: 80% coverage     Co-Pay: 20% Home Health: 100% coverage      Co-Pay:  DME: 80% coverage     Co-Pay: 20% Providers: pt's choice SECONDARY: A[employer]      Policy#: [id number]     Phone#: [phone]   Financial Counselor:       Phone#:    The "Data Collection Information Summary" for patients in Inpatient Rehabilitation Facilities with attached "Privacy Act Statement-Health Care Records" was provided and verbally reviewed with: Patient   Emergency Contact Information Contact Information       Name Relation Home Work Mobile    Dave Erie     [phone]         Other Contacts       Name Relation Home Work Mobile    Reiss,Jackie Relative   [phone]    Yetta Helper     [phone]    Tapscott,Silvia Neighbor     [phone]           Current Medical History  Patient Admitting Diagnosis: debility s/p hiatal hernia repair History of Present Illness: Pt is a 73 year old female with medical hx significant for: hiatal hernia and Cameron's ulcers, HLD, HTN, GERD, lumbar fusion. Pt presented to [facility] on 06/29/23 for hiatal hernia repair by Dr. Melton Squires. Pt had a bleeding complication that required ligation of splenic artery and transfusion of 5 PRBC. Pt had cardiac  arrest and received CPR. Pt intubated. Pt extubated on 4/30. Placed on nasal cannula on 07/02/23 d/t oxygen saturations of 87%. Pt Hgb 6.1 on 5/4. Pt given 2 units PRBC. CT negative for acute bleeding. Therapy evaluations completed and CIR recommended d/t pt's deficits in functional mobility.    Patient's medical record from [facility] has been reviewed by the rehabilitation admission coordinator and physician.   Past Medical History      Past Medical History:  Diagnosis Date   Anemia      iron def   Arthritis      rheumatoid, psoriatic, osteoarthritis    Bone spur      RIGHT FOOT    Broken toe     Bruises easily     Cellulitis     Chronic back pain     COLONIC POLYPS, HX OF 02/09/2007   Cough, persistent 01/21/2012    poss from acei    DES exposure in utero, unknown     Femur fracture, left (HCC)  Fibromyalgia      doesn't require meds   GASTRIC ULCER, ACUTE, HEMORRHAGE, HX OF 02/09/2007   GERD (gastroesophageal reflux disease)      takes Nexium  daily   Goiter     H/O hiatal hernia     H/O: hysterectomy      when 73 yrs old   Headache      Occasional migraines with increased stress   Hx of seasonal allergies      takes Claritin  daily   HYPERLIPIDEMIA 12/29/2006    takes Lovastatin  nightly   HYPERTENSION 12/29/2006    takes Enalapril  daily   IBS 04/28/2007   Insomnia      related to pain;takes Flexeril  and Tylenol  PM nightly   Joint pain     Joint swelling     Neuropathy      small cell   Osteopenia     Palpitations 02/09/2007   PONV (postoperative nausea and vomiting)      NAUSEA   REDUCTION MAMMOPLASTY, HX OF 02/09/2007   Right bundle branch block 02/09/2007   S/P lumbar fusion 6 13 09/03/2011    L4 L5  posterior    SYNCOPE 12/07/2007   Tendonitis      RLE    UNS ADVRS EFF OTH RX MEDICINAL\T\BIOLOGICAL SBSTNC 02/09/2007          Has the patient had major surgery during 100 days prior to admission? Yes   Family History   family history  includes Arthritis in her brother and mother; Colon cancer in her father and mother; Diabetes in her brother and mother; Endometrial cancer in her mother; Heart disease in her father; Hyperlipidemia in her brother; Hypertension in her brother, maternal grandmother, and mother; Infertility in her brother; Other in an other family member; Stroke in her father and maternal grandmother.   Current Medications  Current Medications    Current Facility-Administered Medications:    acetaminophen  (TYLENOL ) tablet 1,000 mg, 1,000 mg, Oral, Q6H, Connor, Chelsea A, MD, 1,000 mg at 07/05/23 1320   bethanechol  (URECHOLINE ) tablet 10 mg, 10 mg, Oral, TID, Ramirez, Armando, MD, 10 mg at 07/05/23 1610   bisacodyl  (DULCOLAX) suppository 10 mg, 10 mg, Rectal, Daily PRN, Adalberto Acton, MD   Chlorhexidine  Gluconate Cloth 2 % PADS 6 each, 6 each, Topical, Daily, Shela Derby, MD, 6 each at 07/05/23 [phone]   diphenhydrAMINE  (BENADRYL ) capsule 25 mg, 25 mg, Oral, Q6H PRN, Adalberto Acton, MD, 25 mg at 07/05/23 0945   diphenhydrAMINE  (BENADRYL ) capsule 50 mg, 50 mg, Oral, Once **OR** diphenhydrAMINE  (BENADRYL ) injection 50 mg, 50 mg, Intravenous, Once, Stechschulte, Avon Boers, MD   docusate sodium  (COLACE) capsule 100 mg, 100 mg, Oral, BID, Mannam, Praveen, MD, 100 mg at 07/04/23 2123   feeding supplement (BOOST / RESOURCE BREEZE) liquid 1 Container, 1 Container, Oral, TID BM, Adalberto Acton, MD, 1 Container at 07/05/23 1322   gabapentin  (NEURONTIN ) capsule 100 mg, 100 mg, Oral, TID, Aldon Hung A, MD, 100 mg at 07/05/23 0945   guaiFENesin-dextromethorphan (ROBITUSSIN DM) 100-10 MG/5ML syrup 5 mL, 5 mL, Oral, Q4H PRN, Shela Derby, MD, 5 mL at 07/04/23 1211   hydrALAZINE  (APRESOLINE ) injection 10 mg, 10 mg, Intravenous, Q4H PRN, Ramirez, Armando, MD, 10 mg at 06/29/23 1350   HYDROmorphone  (DILAUDID ) injection 1 mg, 1 mg, Intravenous, Q4H PRN, Aldon Hung A, MD, 1 mg at 07/05/23 1006   labetalol   (NORMODYNE ) injection 10-20 mg, 10-20 mg, Intravenous, Q2H PRN, Shela Derby, MD, 10 mg at 06/29/23 1545  magic mouthwash, 2 mL, Oral, TID, Stechschulte, Avon Boers, MD, 2 mL at 07/04/23 2124   methocarbamol  (ROBAXIN ) injection 500 mg, 500 mg, Intravenous, Q8H, Connor, Chelsea A, MD, 500 mg at 07/05/23 1320   ondansetron  (ZOFRAN -ODT) disintegrating tablet 4 mg, 4 mg, Oral, Q6H PRN, 4 mg at 07/01/23 1337 **OR** ondansetron  (ZOFRAN ) injection 4 mg, 4 mg, Intravenous, Q6H PRN, Shela Derby, MD, 4 mg at 07/04/23 2120   Oral care mouth rinse, 15 mL, Mouth Rinse, Q2H, Shela Derby, MD, 15 mL at 07/05/23 1321   Oral care mouth rinse, 15 mL, Mouth Rinse, PRN, Shela Derby, MD   Oral care mouth rinse, 15 mL, Mouth Rinse, PRN, Shela Derby, MD   oxyCODONE  (Oxy IR/ROXICODONE ) immediate release tablet 5 mg, 5 mg, Oral, Q6H PRN, Adalberto Acton, MD   pantoprazole  (PROTONIX ) EC tablet 40 mg, 40 mg, Oral, BID, Mannam, Praveen, MD, 40 mg at 07/05/23 0945   traMADol  (ULTRAM ) tablet 50 mg, 50 mg, Oral, Q6H PRN, Adalberto Acton, MD   white petrolatum  (VASELINE) gel, , Topical, PRN, Shela Derby, MD     Patients Current Diet:  Diet Order                  DIET SOFT Fluid consistency: Thin  Diet effective now                         Precautions / Restrictions Precautions Precautions: Fall Precaution/Restrictions Comments: G-tube Restrictions Weight Bearing Restrictions Per Provider Order: No    Has the patient had 2 or more falls or a fall with injury in the past year? No   Prior Activity Level Community (5-7x/wk): gets out of house daily; drives   Prior Functional Level Self Care: Did the patient need help bathing, dressing, using the toilet or eating? Independent   Indoor Mobility: Did the patient need assistance with walking from room to room (with or without device)? Independent   Stairs: Did the patient need assistance with internal or external stairs (with or  without device)? Unknown (pt reports she avoids steps)   Functional Cognition: Did the patient need help planning regular tasks such as shopping or remembering to take medications? Independent   Patient Information Are you of Hispanic, Latino/a,or Spanish origin?: A. No, not of Hispanic, Latino/a, or Spanish origin What is your race?: A. White Do you need or want an interpreter to communicate with a doctor or health care staff?: 0. No   Patient's Response To:  Health Literacy and Transportation Is the patient able to respond to health literacy and transportation needs?: Yes Health Literacy - How often do you need to have someone help you when you read instructions, pamphlets, or other written material from your doctor or pharmacy?: Never In the past 12 months, has lack of transportation kept you from medical appointments or from getting medications?: No In the past 12 months, has lack of transportation kept you from meetings, work, or from getting things needed for daily living?: No   Journalist, newspaper / Equipment Home Equipment: Occupational hygienist (4 wheels), Cane - single point, BSC/3in1, Shower seat - built in, [employer] - tub/shower   Prior Device Use: Indicate devices/aids used by the patient prior to current illness, exacerbation or injury?  Cane (uses occasionally)   Current Functional Level Cognition   Orientation Level: Oriented X4    Extremity Assessment (includes Sensation/Coordination)   Upper Extremity Assessment: Generalized weakness  Lower Extremity Assessment: Defer to PT  evaluation     ADLs   Overall ADL's : Needs assistance/impaired Eating/Feeding: Set up, Sitting Grooming: Set up, Sitting Upper Body Bathing: Moderate assistance, Sitting Lower Body Bathing: Maximal assistance, Sit to/from stand, Sitting/lateral leans Upper Body Dressing : Moderate assistance, Sitting, Standing Lower Body Dressing: Maximal assistance, Sitting/lateral leans, Sit to/from stand Toilet  Transfer: Minimal assistance Toileting- Clothing Manipulation and Hygiene: Maximal assistance Functional mobility during ADLs: Minimal assistance, +2 for physical assistance     Mobility   Overal bed mobility: Needs Assistance Bed Mobility: Rolling, Sit to Sidelying Rolling: Mod assist Sidelying to sit: Mod assist Sit to sidelying: Mod assist     Transfers   Overall transfer level: Needs assistance Equipment used: Rolling walker (2 wheels) Transfers: Sit to/from Stand, Bed to chair/wheelchair/BSC Sit to Stand: Min assist Bed to/from chair/wheelchair/BSC transfer type:: Step pivot Step pivot transfers: Min assist     Ambulation / Gait / Stairs / Wheelchair Mobility         Posture / Balance Balance Overall balance assessment: Needs assistance Sitting-balance support: No upper extremity supported, Feet supported Sitting balance-Leahy Scale: Fair Standing balance support: Bilateral upper extremity supported, Reliant on assistive device for balance Standing balance-Leahy Scale: Poor     Special needs/care consideration Skin Abrasion: arm/bilateral; Blister: groin/right; Ecchymosis: arm/bilateral; Erythema/Redness: arm/bilateral; Surgical Incision: abdomen;  Bladder incontinence, Urethral Catheter and Special service needs Drain: LUQ, 4 abdominal ports: left, right    Previous Home Environment (from acute therapy documentation) Living Arrangements: Alone Available Help at Discharge: Friend(s), Available 24 hours/day Type of Home: House Home Layout: One level Home Access: Stairs to enter Entrance Stairs-Rails: None Secretary/administrator of Steps: 1 Bathroom Shower/Tub: Health visitor: Handicapped height Bathroom Accessibility: Yes How Accessible: Accessible via walker Home Care Services: No   Discharge Living Setting Plans for Discharge Living Setting: Patient's home Type of Home at Discharge: House Discharge Home Layout: One level Discharge Home Access:  Stairs to enter Entrance Stairs-Rails: None Entrance Stairs-Number of Steps: 1 Discharge Bathroom Shower/Tub: Walk-in shower Discharge Bathroom Toilet: Handicapped height Discharge Bathroom Accessibility: Yes How Accessible: Accessible via walker Does the patient have any problems obtaining your medications?: No   Social/Family/Support Systems   Goals Patient/Family Goal for Rehab: PT/OT Supervision  Expected length of stay: 10-12 days Pt/Family Agrees to Admission and willing to participate: Yes Program Orientation Provided & Reviewed with Pt/Caregiver Including Roles  & Responsibilities: Yes   Decrease burden of Care through IP rehab admission: NA   Possible need for SNF placement upon discharge: Not anticipated   Patient Condition: I have reviewed medical records from [facility], spoken with CSW, and patient and friends. I met with patient at the bedside and discussed via phone for inpatient rehabilitation assessment.  Patient will benefit from ongoing PT and OT, can actively participate in 3 hours of therapy a day 5 days of the week, and can make measurable gains during the admission.  Patient will also benefit from the coordinated team approach during an Inpatient Acute Rehabilitation admission.  The patient will receive intensive therapy as well as Rehabilitation physician, nursing, social worker, and care management interventions.  Due to bladder management, safety, skin/wound care, disease management, medication administration, pain management, and patient education the patient requires 24 hour a day rehabilitation nursing.  The patient is currently Min-mod A  with mobility and basic ADLs.  Discharge setting and therapy post discharge at home with home health is anticipated.  Patient has agreed to participate in the Acute  Inpatient Rehabilitation Program and will admit tomorrow.   Preadmission Screen Completed By:  Amiel Kalata, 07/05/2023 3:01  PM ______________________________________________________________________   Discussed status with Dr. Rachel Budds on 07/06/23 at 900 and received approval for admission today.   Admission Coordinator:  Amiel Kalata, CCC-SLP, time 930/Date 07/06/23    Assessment/Plan: Diagnosis: debility Does the need for close, 24 hr/day Medical supervision in concert with the patient's rehab needs make it unreasonable for this patient to be served in a less intensive setting? Yes Co-Morbidities requiring supervision/potential complications: GIB, cardiac arrest, HTN Due to bladder management, bowel management, safety, skin/wound care, disease management, medication administration, pain management, and patient education, does the patient require 24 hr/day rehab nursing? Yes Does the patient require coordinated care of a physician, rehab nurse, PT, OT to address physical and functional deficits in the context of the above medical diagnosis(es)? Yes Addressing deficits in the following areas: balance, endurance, locomotion, strength, transferring, bowel/bladder control, bathing, dressing, feeding, grooming, toileting, and psychosocial support Can the patient actively participate in an intensive therapy program of at least 3 hrs of therapy 5 days a week? Yes The potential for patient to make measurable gains while on inpatient rehab is excellent Anticipated functional outcomes upon discharge from inpatient rehab: supervision PT, supervision OT, n/a SLP Estimated rehab length of stay to reach the above functional goals is: 10-12 days Anticipated discharge destination: Home 10. Overall Rehab/Functional Prognosis: excellent     MD Signature: Rawland Caddy, MD, [facility] Physical Medicine & Rehabilitation Medical Director Rehabilitation Services 07/06/2023

## 2023-07-06 NOTE — Care Management Important Message (Signed)
 Important Message  Patient Details  Name: Jade Cardenas MRN: [id number] Date of Birth: 1950/04/01   Important Message Given:  Yes - Medicare IM     Janith Melnick 07/06/2023, 10:57 AM

## 2023-07-06 NOTE — TOC Transition Note (Signed)
 Transition of Care (TOC) - Discharge Note Sherin Dingwall RN, BSN Transitions of Care Unit 4E- RN Case Manager See Treatment Team for direct phone #   Patient Details  Name: Jade Cardenas MRN: [id number] Date of Birth: [birth year]  Transition of Care [facility]) CM/SW Contact:  Rox Cope, RN Phone Number: 07/06/2023, 11:45 AM   Clinical Narrative:    Notified by CIR liaison that pt has bed for INPT admit today.  Pt stable for transition to [facility] INPT rehab, and will transition later this afternoon to available bed.   No further TOC needs noted.    Final next level of care: IP Rehab Facility Barriers to Discharge: Barriers Resolved   Patient Goals and CMS Choice Patient states their goals for this hospitalization and ongoing recovery are:: to return home   Choice offered to / list presented to : Patient      Discharge Placement                 [facility] INPT rehab      Discharge Plan and Services Additional resources added to the After Visit Summary for     Discharge Planning Services: CM Consult Post Acute Care Choice: IP Rehab          DME Arranged: N/A DME Agency: NA         HH Agency: NA        Social Drivers of Health (SDOH) Interventions SDOH Screenings   Food Insecurity: Patient Unable To Answer (06/29/2023)  Housing: Patient Unable To Answer (06/29/2023)  Transportation Needs: Patient Unable To Answer (06/29/2023)  Utilities: Patient Unable To Answer (06/29/2023)  Alcohol Screen: Low Risk  (05/08/2022)  Depression (PHQ2-9): Medium Risk (05/12/2023)  Financial Resource Strain: Medium Risk (05/08/2023)  Physical Activity: Insufficiently Active (05/08/2023)  Social Connections: Patient Unable To Answer (06/29/2023)  Stress: Stress Concern Present (05/08/2023)  Tobacco Use: Low Risk  (06/29/2023)     Readmission Risk Interventions    07/06/2023   11:45 AM  Readmission Risk Prevention Plan  Medication Screening Complete  Transportation Screening  Complete

## 2023-07-06 NOTE — H&P (Signed)
 Physical Medicine and Rehabilitation Admission H&P    CC: Functional deficits due to debility   HPI: Jade Fugua. Cardenas is a 73 year old female with history of  HTN, PUD, chronic LBP, RA, psoriatic arthritis, anemia, large HH with Cameron's ulcers and iron deficiency anemia with early satiety and reflux who was admitted on 06/29/23 for attempted Lap fundoplication by Dr. Melton Squires. Operative course complicated by significant bleeding in mid portion of stomach after stomach reduced and hiatus dissected. She required 2 rounds of CPR due to cardiac arrest, laparotomy with ligation of splenic artery, 5 units PRBC  and gastric tube placement. She was extubated by 04/30 and  required foley placement for urinary retention. Urecholine  added and foley d/c on 05/05. Elevated WBC felt to be reactive and treated with 3 day course of cipro  for probable UTI. Po intake has been poor and has required IV dextrose  due to hypoglycemic episodes.   SQ heparin  d/c due to recurrent drop in Hgb to 6.8 on 05/03 treated with 2 units PRBC. She has recurrent drop in hgb to 6.1 on 06/04 treated with 2 units. CTA A/P ordered which was negative for extravasation, revealing 8.3 X 5.4 cm posterior mediastinal hematoma likely within surgical bed, post surgical changes with fluid within distal esophagus and partial splenic infarct. Oxygen weaned off but she continues to have chest pain felt to be due to CPR. Intake remains poor.   Therapy consulted and patient noted to be limited by pain and  weakness.  Patient lives alone but has supportive friends who will assist after discharge. She was independent and driving PTA. She currently requires mod assist with ADLs and mod to min assist with mobility. CIR recommended due to functional decline.    Review of Systems  Constitutional:  Positive for malaise/fatigue. Negative for chills and fever.  HENT:  Negative for hearing loss.   Eyes:  Positive for blurred vision (does not have her  contacts in).  Respiratory:  Positive for cough (worse at nights).   Cardiovascular:  Positive for chest pain. Negative for leg swelling.  Gastrointestinal:  Positive for abdominal pain, constipation and heartburn.  Genitourinary:  Negative for frequency and urgency.  Musculoskeletal:  Positive for back pain, joint pain and myalgias.  Neurological:  Positive for sensory change (chronic RLE numbness). Negative for dizziness and headaches.  Psychiatric/Behavioral:  The patient is nervous/anxious and has insomnia (chronic).     Past Medical History:  Diagnosis Date   Anemia    iron def   Arthritis    rheumatoid, psoriatic, osteoarthritis    Bone spur    RIGHT FOOT    Broken toe    Bruises easily    Cellulitis    Chronic back pain    COLONIC POLYPS, HX OF 02/09/2007   Cough, persistent 01/21/2012   poss from acei    DES exposure in utero, unknown    Femur fracture, left (HCC)    Fibromyalgia    doesn't require meds   GASTRIC ULCER, ACUTE, HEMORRHAGE, HX OF 02/09/2007   GERD (gastroesophageal reflux disease)    takes Nexium  daily   Goiter    H/O hiatal hernia    H/O: hysterectomy    when 72 yrs old   Headache    Occasional migraines with increased stress   Hx of seasonal allergies    takes Claritin  daily   HYPERLIPIDEMIA 12/29/2006   takes Lovastatin  nightly   HYPERTENSION 12/29/2006   takes Enalapril  daily   IBS 04/28/2007  Insomnia    related to pain;takes Flexeril  and Tylenol  PM nightly   Joint pain    Joint swelling    Neuropathy    small cell   Osteopenia    Palpitations 02/09/2007   PONV (postoperative nausea and vomiting)    NAUSEA   REDUCTION MAMMOPLASTY, HX OF 02/09/2007   Right bundle branch block 02/09/2007   S/P lumbar fusion 6 13 09/03/2011   L4 L5  posterior    SYNCOPE 12/07/2007   Tendonitis    RLE    UNS ADVRS EFF OTH RX MEDICINAL\T\BIOLOGICAL SBSTNC 02/09/2007    Past Surgical History:  Procedure Laterality Date   BACK SURGERY  12+yrs  ago   Synovial Cyst removal   BACK SURGERY  08/2011   lumbar L4-L5 fusion, lamenectomy   bone spur  6-83yrs ago   right ankle   BUNIONECTOMY  10/03/2014   right foot   BUNIONECTOMY Left    CERVICAL FUSION     2 12    CHOLECYSTECTOMY  1996   HARDWARE REMOVAL Left 08/01/2018   Procedure: Left knee hardware removal;  Surgeon: Liliane Rei, MD;  Location: [facility];  Service: Orthopedics;  Laterality: Left;    LAPAROTOMY N/A 06/29/2023   Procedure: LAPAROTOMY, EXPLORATORY W/LIGATION SPLENIC ARTERY AND GASTRIC TUBE PLACEMENT;  Surgeon: Shela Derby, MD;  Location: [facility];  Service: General;  Laterality: N/A;   POSTERIOR CERVICAL LAMINECTOMY Left 07/30/2015   Procedure: Laminectomy and Foraminotomy - left - Cervical two -Cervical three;  Surgeon: Agustina Aldrich, MD;  Location: [facility];  Service: Neurosurgery;  Laterality: Left;   REDUCTION MAMMAPLASTY Bilateral 1989   TONSILLECTOMY     as a child   TOTAL ABDOMINAL HYSTERECTOMY W/ BILATERAL SALPINGOOPHORECTOMY  1986   TOTAL KNEE ARTHROPLASTY N/A 06/21/2017   Procedure: LEFT TOTAL KNEE ARTHROPLASTY AND RIGHT KNEE CORTISONE INJECTION;  Surgeon: Liliane Rei, MD;  Location: [facility];  Service: Orthopedics;  Laterality: N/A;   XI ROBOTIC ASSISTED HIATAL HERNIA REPAIR N/A 06/29/2023   Procedure: ATTEMPTED XI ROBOTIC ASSISTED HIATAL HERNIA REPAIR WITH FUNDOPLICATION AND MESH;  Surgeon: Shela Derby, MD;  Location: [facility];  Service: General;  Laterality: N/A;  NO MESH INSERTED    Family History  Problem Relation Age of Onset   Diabetes Mother    Colon cancer Mother        x2   Arthritis Mother    Hypertension Mother    Endometrial cancer Mother    Stroke Father    Heart disease Father    Colon cancer Father    Arthritis Brother    Hypertension Brother    Hyperlipidemia Brother    Infertility Brother    Diabetes Brother    Other Other        DISH brother   Hypertension Maternal Grandmother    Stroke Maternal Grandmother     Anesthesia problems Neg Hx    Hypotension Neg Hx    Malignant hyperthermia Neg Hx    Pseudochol deficiency Neg Hx     Social History:  Widowed--husband died a year ago and children live out of state.  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 She  reports that she has never smoked. She has never been exposed to tobacco smoke. She has never used smokeless tobacco. She reports that she does not drink alcohol and does not use drugs.   Allergies  Allergen Reactions   Erythromycin Hives, Swelling and Other (See Comments)    All Mycins; throat  swells shut and she gets "really hot"   Iodinated Contrast Media Shortness Of Breath and Swelling    CT contrast; throat swelling, difficulty breathing.  In [place]  "many years ago."   Ace Inhibitors Cough   Indomethacin Other (See Comments)   Keflex [Cephalexin] Hives   Macrobid  [Nitrofurantoin ] Other (See Comments)    dizziness    Facility-Administered Medications Prior to Admission  Medication Dose Route Frequency Provider Last Rate Last Admin   dexamethasone  (DECADRON ) injection 4 mg  4 mg Intra-articular Once Wagoner, Matthew R, DPM       Medications Prior to Admission  Medication Sig Dispense Refill   acetaminophen  (TYLENOL ) 650 MG CR tablet Take 1,300 mg by mouth See admin instructions. Take 1300 mg in the morning and may take an additional 1300 mg dose as needed for pain     Calcium  Carb-Cholecalciferol  (CALCIUM +D3 PO) Take 1 tablet by mouth daily.     carboxymethylcellulose (REFRESH PLUS) 0.5 % SOLN Place 1 drop into both eyes as needed (dry eyes).     diphenhydramine -acetaminophen  (TYLENOL  PM) 25-500 MG TABS tablet Take 1 tablet by mouth at bedtime.     folic acid  (FOLVITE ) 1 MG tablet Take 1 tablet (1 mg total) by mouth daily. 90 tablet 3   losartan  (COZAAR ) 100 MG tablet Take 1 tablet (100 mg total) by mouth daily. 90 tablet 3   lovastatin  (MEVACOR ) 20 MG tablet Take 1 tablet (20 mg total) by mouth every evening. 90  tablet 3   methotrexate  50 MG/2ML injection INJECT 0.3ML INTO THE SKIN ONCE WEEKLY (Patient taking differently: Inject 50 mg/m2 into the skin once a week. INJECT 0.3ML INTO THE SKIN ONCE WEEKLY) 6 mL 0   Multiple Vitamins-Minerals (MULTIVITAMIN ADULTS PO) Take 1 tablet by mouth daily.      pantoprazole  (PROTONIX ) 40 MG tablet Take 40 mg by mouth 2 (two) times daily.     B-D ALLERGY  SYRINGE 1CC/28G 28G X 1/2" 1 ML MISC USE AS DIRECTED ONCE WEEKLY 12 each 3   SIMPONI  50 MG/0.5ML SOAJ INJECT 50MG  UNDER THE SKIN ONCE MONTHLY (Patient taking differently: Inject 50 mg into the skin once a week. INJECT 50MG  UNDER THE SKIN ONCE MONTHLY) 0.5 mL 4     Home: Home Living Family/patient expects to be discharged to:: Private residence Living Arrangements: Alone Available Help at Discharge: Friend(s), Available 24 hours/day Type of Home: House Home Access: Stairs to enter Secretary/administrator of Steps: 1 Entrance Stairs-Rails: None Home Layout: One level Bathroom Shower/Tub: Health visitor: Handicapped height Bathroom Accessibility: Yes Home Equipment: Rollator (4 wheels), Cane - single point, BSC/3in1, Shower seat - built in, [facility] [facility] - tub/shower   Functional History: Prior Function Prior Level of Function : Independent/Modified Independent, Driving Mobility Comments: ind ADLs Comments: ind  Functional Status:  Mobility: Bed Mobility Overal bed mobility: Needs Assistance Bed Mobility: Rolling, Sit to Sidelying Rolling: Min assist Sidelying to sit: Mod assist Sit to sidelying: Mod assist General bed mobility comments: Assist to elevate trunk into sitting Transfers Overall transfer level: Needs assistance Equipment used: Rolling walker (2 wheels) Transfers: Sit to/from Stand Sit to Stand: Min assist, From elevated surface Bed to/from chair/wheelchair/BSC transfer type:: Step pivot Step pivot transfers: Min assist General transfer comment: Assist to power up. Verbal  cues for hand placement Ambulation/Gait Ambulation/Gait assistance: Min assist Gait Distance (Feet): 15 Feet Assistive device: Rolling walker (2 wheels) Gait Pattern/deviations: Step-through pattern, Decreased step length - right, Decreased step length - left, Decreased stride length  General Gait Details: Assist for balance and support. Gait velocity: decr Gait velocity interpretation: <1.31 ft/sec, indicative of household ambulator    ADL: ADL Overall ADL's : Needs assistance/impaired Eating/Feeding: Set up, Sitting Grooming: Set up, Sitting Upper Body Bathing: Moderate assistance, Sitting Lower Body Bathing: Maximal assistance, Sit to/from stand, Sitting/lateral leans Upper Body Dressing : Moderate assistance, Sitting, Standing Lower Body Dressing: Maximal assistance, Sitting/lateral leans, Sit to/from stand Toilet Transfer: Minimal assistance Toileting- Clothing Manipulation and Hygiene: Maximal assistance Functional mobility during ADLs: Minimal assistance, +2 for physical assistance  Cognition: Cognition Orientation Level: Oriented X4 Cognition Arousal: Alert Behavior During Therapy: WFL for tasks assessed/performed   Blood pressure (!) 151/65, pulse 91, temperature (!) 96.8 F (36 C), temperature source Axillary, resp. rate 20, height 5\' 5"  (1.651 m), weight 95.3 kg, last menstrual period 03/02/1984, SpO2 95%. Physical Exam Vitals and nursing note reviewed.  Constitutional:      General: She is not in acute distress.    Appearance: Normal appearance.  HENT:     Head: Normocephalic.     Right Ear: External ear normal.     Left Ear: External ear normal.     Nose: Nose normal.     Mouth/Throat:     Mouth: Mucous membranes are moist.  Eyes:     Extraocular Movements: Extraocular movements intact.     Conjunctiva/sclera: Conjunctivae normal.     Pupils: Pupils are equal, round, and reactive to light.  Cardiovascular:     Rate and Rhythm: Normal rate and regular  rhythm.     Heart sounds: No murmur heard.    No gallop.     Comments: Chest wall tenderness Pulmonary:     Effort: Pulmonary effort is normal. No respiratory distress.     Breath sounds: No wheezing.  Abdominal:     General: There is no distension.     Palpations: Abdomen is soft.     Tenderness: There is no abdominal tenderness. There is no guarding.     Comments: Midline incision as well as multiple smaller incisions C/D/I with staples in place. Gastrostomy tube site with crusted blood--non tender.   Musculoskeletal:     Cervical back: Normal range of motion and neck supple.     Right lower leg: No edema.     Left lower leg: No edema.     Comments: Chronic arthritic changes in hands/fingers.   Skin:    General: Skin is warm and dry.     Comments: Ecchymotic area LUE at site of prior IV?    Neurological:     Mental Status: She is alert.     Comments: Alert and oriented x 3. Normal insight and awareness. Intact Memory. Normal language and speech. Cranial nerve exam unremarkable. MMT: BUE 4-/5 proximally to 4/5 distally. BLE 3+/5 HF, 4/5 KE and 4+/5 ADF/PF. Sensory exam normal for light touch and pain in all 4 limbs. No limb ataxia or cerebellar signs. No abnormal tone appreciated.  Jade Cardenas    Psychiatric:        Mood and Affect: Mood normal.        Behavior: Behavior normal.     Comments: Anxious and frustrated.      Results for orders placed or performed during the hospital encounter of 06/29/23 (from the past 48 hours)  Prepare RBC (crossmatch)     Status: None   Collection Time: 07/04/23  8:55 PM  Result Value Ref Range   Order Confirmation      ORDER PROCESSED  BY BLOOD BANK Performed at [facility], [address] [place], [place] [address]   Basic metabolic panel with GFR     Status: Abnormal   Collection Time: 07/05/23  9:21 AM  Result Value Ref Range   Sodium 138 135 - 145 mmol/L   Potassium 4.4 3.5 - 5.1 mmol/L   Chloride 103 98 - 111 mmol/L   CO2 22 22 - 32  mmol/L   Glucose, Bld 219 (H) 70 - 99 mg/dL    Comment: Glucose reference range applies only to samples taken after fasting for at least 8 hours.   BUN 24 (H) 8 - 23 mg/dL   Creatinine, Ser 6.04 0.44 - 1.00 mg/dL   Calcium  8.9 8.9 - 10.3 mg/dL   GFR, Estimated >54 >09 mL/min    Comment: (NOTE) Calculated using the CKD-EPI Creatinine Equation (2021)    Anion gap 13 5 - 15    Comment: Performed at [facility], [address], [place], [place] [address]  CBC     Status: Abnormal   Collection Time: 07/05/23  9:21 AM  Result Value Ref Range   WBC 17.1 (H) 4.0 - 10.5 K/uL   RBC 4.48 3.87 - 5.11 MIL/uL   Hemoglobin 13.4 12.0 - 15.0 g/dL    Comment: REPEATED TO VERIFY POST TRANSFUSION SPECIMEN    HCT 39.0 36.0 - 46.0 %   MCV 87.1 80.0 - 100.0 fL   MCH 29.9 26.0 - 34.0 pg   MCHC 34.4 30.0 - 36.0 g/dL   RDW 47.8 (H) 29.5 - 62.1 %   Platelets 272 150 - 400 K/uL   nRBC 0.5 (H) 0.0 - 0.2 %    Comment: Performed at [facility], [address], [place], [place] [address]  Magnesium      Status: None   Collection Time: 07/05/23  9:21 AM  Result Value Ref Range   Magnesium  2.0 1.7 - 2.4 mg/dL    Comment: Performed at [facility], [address], [place], [place] [address]  Protime-INR     Status: None   Collection Time: 07/05/23  9:21 AM  Result Value Ref Range   Prothrombin Time 14.5 11.4 - 15.2 seconds   INR 1.1 0.8 - 1.2    Comment: (NOTE) INR goal varies based on device and disease states. Performed at [facility], [address] [place], [place] [address]    CT Angio Abd/Pel w/ and/or w/o Result Date: 07/05/2023 CLINICAL DATA:  Lower GI bleed. Patient is postoperative day 5 status post robotic assisted hiatal hernia repair with fundoplication, exploratory laparotomy, splenic artery ligation, and gastrostomy tube placement. EXAM: CTA ABDOMEN AND PELVIS WITHOUT AND WITH CONTRAST TECHNIQUE: Multidetector CT imaging of the abdomen and pelvis was performed  using the standard protocol during bolus administration of intravenous contrast. Multiplanar reconstructed images and MIPs were obtained and reviewed to evaluate the vascular anatomy. RADIATION DOSE REDUCTION: This exam was performed according to the departmental dose-optimization program which includes automated exposure control, adjustment of the mA and/or kV according to patient size and/or use of iterative reconstruction technique. CONTRAST:  75mL OMNIPAQUE  IOHEXOL  350 MG/ML SOLN COMPARISON:  CT of the abdomen performed August 16, 2022 FINDINGS: VASCULAR Aorta: Mild atherosclerotic changes, no aneurysm. Celiac: The celiac artery is patent. The splenic artery is patent in the proximal segment with a central segment interruption. There is subsequent distal reconstitution of the splenic artery. There is also subtle foci of hyperemia within  the ligation bed which are favored to represent enhancement of collateral arterial structures. SMA: Patent.  Accessory/replaced right hepatic artery. Renals: Single right renal artery with mild atherosclerotic change in the proximal segment. Single left renal artery with mild atherosclerotic change in the proximal segment. IMA: Patent. Inflow: Mild atherosclerotic changes bilaterally, patent. Proximal Outflow: Patent. Veins: Main portal vein is patent. Possible central interruption of the splenic vein. Review of the MIP images confirms the above findings. NON-VASCULAR Lower chest: There are small bilateral pleural effusions which are low in attenuation. In the presumed postsurgical bed, there is either a loculated central hemothorax or postoperative hematoma which measures approximately 8.3 x 5.4 cm on axial imaging, image 8 of CT series 14. fluid is present within the distal esophagus. Hepatobiliary: Liver is somewhat heterogeneous in appearance. There is a focal fluid collection anterior to the left hemi liver which measures approximately 1.5 x 4.4 cm and contains small foci of  air. This is not clearly demonstrate rim enhancement. This also may be within the liver capsule. Postsurgical changes from cholecystectomy. Pancreas: Mildly heterogeneous appearance of the pancreas. Spleen: The spleen is heterogeneous in appearance which is possibly sequelae of multifocal infarction. Adrenals/Urinary Tract: Right upper pole renal cyst is not significantly changed. There is a small amount of fluid anterior to the left kidney which may be postsurgical. No hydronephrosis. Urinary bladder is decompressed. A urinary catheter is present. Stomach/Bowel: An enteric tube is present within the stomach. Postsurgical changes from hiatal hernia repair. A moderate volume of well-formed stool material is present within the colon. Colonic diverticulosis. Lymphatic: Nothing significant. Reproductive: Status post hysterectomy. No adnexal masses. Other: Postsurgical changes in the ventral abdominal wall. Mild stranding is present in the flank soft tissues. Musculoskeletal: Postsurgical changes from posterior spinal fusion of the lower lumbar spine. Associated degenerative spondylosis. IMPRESSION: 1. There is no evidence of arterial extravasation into the GI tract. 2. A posterior mediastinal hematoma is favored, likely within the surgical bed which measures approximately 8.3 x 5.4 cm and may account for the patient's decrease in hematocrit. 3. Postsurgical changes from hiatal hernia repair, laparotomy, and splenic artery ligation. Fluid is present within the distal esophagus which suggests flow limitation (possibly secondary to swelling/hematoma). 4. Partial splenic infarct. 5. A small fluid collection is present anterior to the left hemi liver which is indeterminate. This measures approximately 1.5 x 4.4 cm. Electronically Signed   By: Reagan Camera M.D.   On: 07/05/2023 12:22      Blood pressure (!) 151/65, pulse 91, temperature (!) 96.8 F (36 C), temperature source Axillary, resp. rate 20, height 5\' 5"  (1.651  m), weight 95.3 kg, last menstrual period 03/02/1984, SpO2 95%.  Medical Problem List and Plan: 1. Functional deficits secondary to debility after hiatal hernia repair/fundoplication/splenic artery repair and subsequent complications  -patient may shower  -ELOS/Goals: 10-12 days, supervision goals with PT and OT 2.  Antithrombotics: -DVT/anticoagulation:  Mechanical: Sequential compression devices, below knee Bilateral lower extremities due to hematoma/bleeding.   -antiplatelet therapy: N/A 3. Pain Management: Oxycodone  prn severe pain and ultram  prn moderate pain  --decrease tylenol  to 650 mg qid.  4. Mood/Behavior/Sleep: LCSW to follow for evaluation and support.  --team to provide encouragement/ego support.   -antipsychotic agents: N/A 5. Neuropsych/cognition: This patient is capable of making decisions on her own behalf. 6. Skin/Wound Care: Routine pressure relief measures. Monitor incisions for healing. No dressings needed 7. Fluids/Electrolytes/Nutrition: Monitor I/O. Change breeze to ensure to avoid GERD  --friends to bring in food/supplements  from home  --will continue to monitor for hypoglycemic episode.  8. Acute on chronic anemia: Has had rise in H/H from 6.1-->13.4 question accurate. Transfused with 4 units post op. --Will recheck in am  --was getting Iron infusions by Dr. Salomon Cree.  9. Mediastinal hematoma: Had episode of hematemesis last night.  --Will check CXR for follow up.  10. Chest wall pain: Likely due to CPR. Continue to encourage deep breathing/cough every hour 11. Persistent leucocytosis: Monitor for fevers and signs of infection  --WBC has trended in 14-18 range.   -afebrile at present Episode of Hematemesis/Nocturnal cough: Continues to have chest pain 12. Constipation: Has not had BM since last week?Jade Cardenas Moderate stool noted in colon on X rays.  and relays abdominal fullness.    --will add senna to colace   -does realize that her po intake needs to pick up as  well 13. Urinary retention:  Has voided twice since foley came out yesterday. Needs to drink more  --will monitor voiding with bladder scans. 14. Pre-renal azotemia: Encourage fluid/po intake.  15. RA/Psoriatic arthritis: Reports diffuse pain for past month (due to need to hold meds pre-op)   --Has been cleared to resume Methotrexate  (today) and Simponi  (friend to bring in tomorrow)   Jade Hickman, PA-C 07/06/2023

## 2023-07-06 NOTE — Discharge Summary (Signed)
 Physician Discharge Summary  Patient ID: Jade Cardenas MRN: [id number] DOB/AGE: 1950-11-14 73 y.o.  Admit date: 06/29/2023 Discharge date: 07/06/2023  Admission Diagnoses: hiatal hernia  Discharge Diagnoses:  Principal Problem:   Hiatal hernia Active Problems:   H/O hiatal hernia   Discharged Condition: good  Hospital Course: Postoperative patient was taken to the ICU intubated and in critical condition. Follow-up labs revealed a stable hematocrit.  Reviewed. Patient was seen by CCM to help with vent support.  Patient was subsequently weaned and extubated on postop day 1.  Postop day 1 patient did have some pain discomfort as expected.  Patient hemoglobin stabilized.  She was started on a liquid diet advance to full liquid diet which she tolerated well.  She was transferred to the floor.  Patient began working with physical therapy.  She was transition to a soft diet which she tolerated.  She did require RBC trx for anemia.  This stablized. CT was ordered to look for bleeding which was negative and showed old blood in the mediastinum.  Patient continue to work on her pain control with muscle relaxers and pain medication.  She began working with PT and was deemed a candidate for CIR.  She was doing well and transferred to CIR for more intense rehab   Consults: None  Significant Diagnostic Studies:  Results for orders placed or performed during the hospital encounter of 06/29/23 (from the past 48 hours)  Prepare RBC (crossmatch)     Status: None   Collection Time: 07/04/23  8:55 PM  Result Value Ref Range   Order Confirmation      ORDER PROCESSED BY BLOOD BANK Performed at [facility], [address] [place], [place] [address]   Basic metabolic panel with GFR     Status: Abnormal   Collection Time: 07/05/23  9:21 AM  Result Value Ref Range   Sodium 138 135 - 145 mmol/L   Potassium 4.4 3.5 - 5.1 mmol/L   Chloride 103 98 - 111 mmol/L   CO2 22 22 - 32 mmol/L    Glucose, Bld 219 (H) 70 - 99 mg/dL    Comment: Glucose reference range applies only to samples taken after fasting for at least 8 hours.   BUN 24 (H) 8 - 23 mg/dL   Creatinine, Ser 8.46 0.44 - 1.00 mg/dL   Calcium  8.9 8.9 - 10.3 mg/dL   GFR, Estimated >96 >29 mL/min    Comment: (NOTE) Calculated using the CKD-EPI Creatinine Equation (2021)    Anion gap 13 5 - 15    Comment: Performed at [facility], [address] [place], [place] [address]  CBC     Status: Abnormal   Collection Time: 07/05/23  9:21 AM  Result Value Ref Range   WBC 17.1 (H) 4.0 - 10.5 K/uL   RBC 4.48 3.87 - 5.11 MIL/uL   Hemoglobin 13.4 12.0 - 15.0 g/dL    Comment: REPEATED TO VERIFY POST TRANSFUSION SPECIMEN    HCT 39.0 36.0 - 46.0 %   MCV 87.1 80.0 - 100.0 fL   MCH 29.9 26.0 - 34.0 pg   MCHC 34.4 30.0 - 36.0 g/dL   RDW 32.4 (H) 40.1 - 02.7 %   Platelets 272 150 - 400 K/uL   nRBC 0.5 (H) 0.0 - 0.2 %    Comment: Performed at [facility], [address] [place], [place] [address]  Magnesium      Status: None   Collection Time: 07/05/23  9:21 AM  Result Value Ref Range   Magnesium  2.0 1.7 - 2.4 mg/dL    Comment: Performed at [facility], [address], [place], [place] [address]  Protime-INR     Status: None   Collection Time: 07/05/23  9:21 AM  Result Value Ref Range   Prothrombin Time 14.5 11.4 - 15.2 seconds   INR 1.1 0.8 - 1.2    Comment: (NOTE) INR goal varies based on device and disease states. Performed at [facility], [address] [place], [place] [address]      RADS: IMPRESSION: 1. There is no evidence of arterial extravasation into the GI tract. 2. A posterior mediastinal hematoma is favored, likely within the surgical bed which measures approximately 8.3 x 5.4 cm and may account for the patient's decrease in hematocrit. 3. Postsurgical changes from hiatal hernia repair, laparotomy, and splenic artery ligation. Fluid is present within the distal esophagus  which suggests flow limitation (possibly secondary to swelling/hematoma). 4. Partial splenic infarct. 5. A small fluid collection is present anterior to the left hemi liver which is indeterminate. This measures approximately 1.5 x 4.4 cm.    Treatments: surgery: per op note  Discharge Exam: Blood pressure (!) 151/65, pulse 91, temperature (!) 96.8 F (36 C), temperature source Axillary, resp. rate 20, height 5\' 5"  (1.651 m), weight 95.3 kg, last menstrual period 03/02/1984, SpO2 95%. General appearance: alert and cooperative GI: soft, non-tender; bowel sounds normal; no masses,  no organomegaly and inc c/d/i  Disposition: Discharge disposition: 91-DC/txfr to [facility] cert LTACH with planned acute hosp IP readmission       Discharge Instructions     Diet - low sodium heart healthy   Complete by: As directed    Increase activity slowly   Complete by: As directed       Allergies as of 07/06/2023       Reactions   Erythromycin Hives, Swelling, Other (See Comments)   All Mycins; throat swells shut and she gets "really hot"   Iodinated Contrast Media Shortness Of Breath, Swelling   CT contrast; throat swelling, difficulty breathing.  In [place]  "many years ago."   Ace Inhibitors Cough   Indomethacin Other (See Comments)   Keflex [cephalexin] Hives   Macrobid  [nitrofurantoin ] Other (See Comments)   dizziness        Medication List     TAKE these medications    acetaminophen  650 MG CR tablet Commonly known as: TYLENOL  Take 1,300 mg by mouth See admin instructions. Take 1300 mg in the morning and may take an additional 1300 mg dose as needed for pain   B-D ALLERGY  SYRINGE 1CC/28G 28G X 1/2" 1 ML Misc Generic drug: Tuberculin-Allergy  Syringes USE AS DIRECTED ONCE WEEKLY   CALCIUM +D3 PO Take 1 tablet by mouth daily.   carboxymethylcellulose 0.5 % Soln Commonly known as: REFRESH PLUS Place 1 drop into both eyes as needed (dry eyes).   diphenhydramine -acetaminophen   25-500 MG Tabs tablet Commonly known as: TYLENOL  PM Take 1 tablet by mouth at bedtime.   folic acid  1 MG tablet Commonly known as: FOLVITE  Take 1 tablet (1 mg total) by mouth daily.   losartan  100 MG tablet Commonly known as: COZAAR  Take 1 tablet (100 mg total) by mouth daily.   lovastatin  20 MG tablet Commonly known as: MEVACOR  Take 1 tablet (20 mg total) by mouth every evening.   methocarbamol  1000 MG/10ML injection Commonly known as: ROBAXIN  Inject 5 mLs (500 mg total) into the  vein every 8 (eight) hours.   methotrexate  50 MG/2ML injection INJECT 0.3ML INTO THE SKIN ONCE WEEKLY What changed: See the new instructions.   MULTIVITAMIN ADULTS PO Take 1 tablet by mouth daily.   oxyCODONE  5 MG immediate release tablet Commonly known as: Oxy IR/ROXICODONE  Take 1 tablet (5 mg total) by mouth every 6 (six) hours as needed for severe pain (pain score 7-10).   pantoprazole  40 MG tablet Commonly known as: PROTONIX  Take 40 mg by mouth 2 (two) times daily.   Simponi  50 MG/0.5ML Soaj Generic drug: Golimumab  INJECT 50MG  UNDER THE SKIN ONCE MONTHLY         Signed: Shela Derby 07/06/2023, 9:58 AM

## 2023-07-06 NOTE — Progress Notes (Signed)
 Occupational Therapy Treatment Patient Details Name: Jade Cardenas MRN: [id number] DOB: 1950/10/12 Today's Date: 07/06/2023   History of present illness 73 y.o. female presents to [facility] on 06/29/2023 with large hiatal hernia and Cameron's ulcers. Pt underwent attempted hiatal hernia repair, complicated by excessive bleeding and requiring laparotomy with ligation of splenic artery and G-tube placement. Pt requires chest compressions during procedure, remained intubated and on pressors after. Pt extubated on 4/30. PMH includes HLD, HTN, GERD, Lumbar fusion, psoriatic arthritis.   OT comments  Patient received in supine and apprehensive about participating due to rib pain but agreeable with encouragement. Patient demonstrating good gains with rolling and mod assist to get to EOB due to rib pain. Patient performed grooming and self care tasks seated on EOB and declined standing due to pain. Patient stating decreased pain and asked to walk in room before returning to supine and was min to CGA to perform. Patient will benefit from intensive inpatient follow-up therapy, >3 hours/day. Acute OT to continue to follow to address established goals to facilitate DC to next venue of care.       If plan is discharge home, recommend the following:  A lot of help with walking and/or transfers;A lot of help with bathing/dressing/bathroom;Assistance with cooking/housework;Direct supervision/assist for medications management;Direct supervision/assist for financial management;Assist for transportation;Help with stairs or ramp for entrance   Equipment Recommendations  Other (comment) (defer)    Recommendations for Other Services      Precautions / Restrictions Precautions Precautions: Fall Recall of Precautions/Restrictions: Intact Restrictions Weight Bearing Restrictions Per Provider Order: No       Mobility Bed Mobility Overal bed mobility: Needs Assistance Bed Mobility: Rolling, Sidelying to Sit,  Sit to Sidelying Rolling: Min assist Sidelying to sit: Mod assist     Sit to sidelying: Mod assist General bed mobility comments: patient requiring mod assist to get to EOB and back to supine due to rib pain    Transfers Overall transfer level: Needs assistance Equipment used: Rolling walker (2 wheels) Transfers: Sit to/from Stand Sit to Stand: Min assist, From elevated surface           General transfer comment: cues for hand placement and min assist to power up. Patient able to ambulate to door and back to EOB with min to CGA     Balance Overall balance assessment: Needs assistance Sitting-balance support: No upper extremity supported, Feet supported Sitting balance-Leahy Scale: Fair Sitting balance - Comments: able to perform self care tasks seated on EOB   Standing balance support: Bilateral upper extremity supported, Reliant on assistive device for balance Standing balance-Leahy Scale: Poor Standing balance comment: reliant on RW for support                           ADL either performed or assessed with clinical judgement   ADL Overall ADL's : Needs assistance/impaired     Grooming: Wash/dry hands;Wash/dry face;Oral care;Brushing hair;Set up;Sitting Grooming Details (indicate cue type and reason): seated on EOB             Lower Body Dressing: Maximal assistance;Sitting/lateral leans;Sit to/from stand                 General ADL Comments: patient performed self care tasks seated on EOB due to complaints of rib pain    Extremity/Trunk Assessment Upper Extremity Assessment Upper Extremity Assessment: Generalized weakness            Vision  Perception     Praxis     Communication Communication Communication: No apparent difficulties   Cognition Arousal: Alert Behavior During Therapy: WFL for tasks assessed/performed Cognition: No apparent impairments                               Following commands:  Intact        Cueing   Cueing Techniques: Verbal cues  Exercises      Shoulder Instructions       General Comments VSS on RA    Pertinent Vitals/ Pain       Pain Assessment Pain Assessment: Faces Faces Pain Scale: Hurts little more Pain Location: ribs Pain Descriptors / Indicators: Sore Pain Intervention(s): Limited activity within patient's tolerance, Monitored during session, Repositioned  Home Living                                          Prior Functioning/Environment              Frequency  Min 2X/week        Progress Toward Goals  OT Goals(current goals can now be found in the care plan section)  Progress towards OT goals: Progressing toward goals  Acute Rehab OT Goals Patient Stated Goal: go to rehab OT Goal Formulation: With patient Time For Goal Achievement: 07/18/23 Potential to Achieve Goals: Good ADL Goals Pt Will Perform Upper Body Dressing: with min assist;sitting Pt Will Perform Lower Body Dressing: with min assist;sitting/lateral leans;sit to/from stand;with adaptive equipment Pt Will Transfer to Toilet: with min assist;ambulating;regular height toilet Additional ADL Goal #1: pt will tolerate OOB activity x10 min in prep for ADLs  Plan      Co-evaluation                 AM-PAC OT "6 Clicks" Daily Activity     Outcome Measure   Help from another person eating meals?: A Little Help from another person taking care of personal grooming?: A Little Help from another person toileting, which includes using toliet, bedpan, or urinal?: A Lot Help from another person bathing (including washing, rinsing, drying)?: A Lot Help from another person to put on and taking off regular upper body clothing?: A Lot Help from another person to put on and taking off regular lower body clothing?: A Lot 6 Click Score: 14    End of Session Equipment Utilized During Treatment: Rolling walker (2 wheels)  OT Visit Diagnosis:  Unsteadiness on feet (R26.81);Other abnormalities of gait and mobility (R26.89);Muscle weakness (generalized) (M62.81)   Activity Tolerance Patient tolerated treatment well   Patient Left in bed;with call bell/phone within reach;with bed alarm set;with family/visitor present   Nurse Communication Mobility status        Time: 7253-6644 OT Time Calculation (min): 24 min  Charges: OT General Charges $OT Visit: 1 Visit OT Treatments $Self Care/Home Management : 8-22 mins $Therapeutic Activity: 8-22 mins  Anitra Barn, OTA Acute Rehabilitation Services  Office [phone]   Jade Cardenas 07/06/2023, 12:11 PM

## 2023-07-06 NOTE — Progress Notes (Signed)
 Inpatient Rehabilitation Admission Medication Review by a Pharmacist  A complete drug regimen review was completed for this patient to identify any potential clinically significant medication issues.  High Risk Drug Classes Is patient taking? Indication by Medication  Antipsychotic Yes, as an intravenous medication Compazine- n/v  Anticoagulant No   Antibiotic No   Opioid Yes Tramadol , oxyIR- acute pain  Antiplatelet No   Hypoglycemics/insulin  No   Vasoactive Medication No   Chemotherapy Yes, Chemotherapy by other route Methotrexate  IV- Psoriatic Arthritis  Other Yes Golimumab  [place]- Psoriatic Arthritis Benadryl - itching Trazodone- sleep Urecholine - neurogenic bladder urinary retention Robaxin - muscle spasms Protonix - GERD     Type of Medication Issue Identified Description of Issue Recommendation(s)  Drug Interaction(s) (clinically significant)     Duplicate Therapy     Allergy      No Medication Administration End Date     Incorrect Dose     Additional Drug Therapy Needed     Significant med changes from prior encounter (inform family/care partners about these prior to discharge).    Other       Clinically significant medication issues were identified that warrant physician communication and completion of prescribed/recommended actions by midnight of the next day:  No    Time spent performing this drug regimen review (minutes):  30   Khyson Sebesta BS, PharmD, BCPS Clinical Pharmacist 07/06/2023 2:25 PM  Contact: [phone] after 3 PM  "Be curious, not judgmental..." -Rumalda Counter

## 2023-07-06 NOTE — Progress Notes (Signed)
 Report given to CIR RN. All questions answered.   Xavia Kniskern L Taila Basinski, RN

## 2023-07-06 NOTE — Progress Notes (Signed)
 Inpatient Rehab Admissions Coordinator:    I have a CIR bed for this Pt. RN may call report to [phone].    Pt. To admit to CIR for estimated 12-14 days with the goal of discharging home with care of her friends.   Wandalee Gust, MS, CCC-SLP Rehab Admissions Coordinator  [phone] (celll) [phone] (office)

## 2023-07-06 NOTE — Progress Notes (Signed)
 7 Days Post-Op   Subjective/Chief Complaint: Doing well  Tol Po +BM Working with PT   Objective: Vital signs in last 24 hours: Temp:  [97.7 F (36.5 C)-98.4 F (36.9 C)] 97.7 F (36.5 C) (05/06 0343) Pulse Rate:  [83-94] 93 (05/06 0343) Resp:  [19-26] 20 (05/06 0343) BP: (131-160)/(52-85) 140/52 (05/06 0343) SpO2:  [96 %-99 %] 96 % (05/06 0343) Last BM Date : 07/04/23  Intake/Output from previous day: 05/05 0701 - 05/06 0700 In: 720 [P.O.:720] Out: 1100 [Urine:1100] Intake/Output this shift: No intake/output data recorded.  General appearance: alert and cooperative GI: soft, non-tender; bowel sounds normal; no masses,  no organomegaly and inc c/d/i  Lab Results:  Recent Labs    07/04/23 0329 07/05/23 0921  WBC 14.5* 17.1*  HGB 6.1* 13.4  HCT 18.3* 39.0  PLT 221 272   BMET Recent Labs    07/04/23 0329 07/05/23 0921  NA 138 138  K 3.7 4.4  CL 107 103  CO2 25 22  GLUCOSE 157* 219*  BUN 21 24*  CREATININE 0.68 0.74  CALCIUM  7.8* 8.9   PT/INR Recent Labs    07/05/23 0921  LABPROT 14.5  INR 1.1   ABG No results for input(s): "PHART", "HCO3" in the last 72 hours.  Invalid input(s): "PCO2", "PO2"  Studies/Results: CT Angio Abd/Pel w/ and/or w/o Result Date: 07/05/2023 CLINICAL DATA:  Lower GI bleed. Patient is postoperative day 5 status post robotic assisted hiatal hernia repair with fundoplication, exploratory laparotomy, splenic artery ligation, and gastrostomy tube placement. EXAM: CTA ABDOMEN AND PELVIS WITHOUT AND WITH CONTRAST TECHNIQUE: Multidetector CT imaging of the abdomen and pelvis was performed using the standard protocol during bolus administration of intravenous contrast. Multiplanar reconstructed images and MIPs were obtained and reviewed to evaluate the vascular anatomy. RADIATION DOSE REDUCTION: This exam was performed according to the departmental dose-optimization program which includes automated exposure control, adjustment of the mA  and/or kV according to patient size and/or use of iterative reconstruction technique. CONTRAST:  75mL OMNIPAQUE  IOHEXOL  350 MG/ML SOLN COMPARISON:  CT of the abdomen performed August 16, 2022 FINDINGS: VASCULAR Aorta: Mild atherosclerotic changes, no aneurysm. Celiac: The celiac artery is patent. The splenic artery is patent in the proximal segment with a central segment interruption. There is subsequent distal reconstitution of the splenic artery. There is also subtle foci of hyperemia within the ligation bed which are favored to represent enhancement of collateral arterial structures. SMA: Patent.  Accessory/replaced right hepatic artery. Renals: Single right renal artery with mild atherosclerotic change in the proximal segment. Single left renal artery with mild atherosclerotic change in the proximal segment. IMA: Patent. Inflow: Mild atherosclerotic changes bilaterally, patent. Proximal Outflow: Patent. Veins: Main portal vein is patent. Possible central interruption of the splenic vein. Review of the MIP images confirms the above findings. NON-VASCULAR Lower chest: There are small bilateral pleural effusions which are low in attenuation. In the presumed postsurgical bed, there is either a loculated central hemothorax or postoperative hematoma which measures approximately 8.3 x 5.4 cm on axial imaging, image 8 of CT series 14. fluid is present within the distal esophagus. Hepatobiliary: Liver is somewhat heterogeneous in appearance. There is a focal fluid collection anterior to the left hemi liver which measures approximately 1.5 x 4.4 cm and contains small foci of air. This is not clearly demonstrate rim enhancement. This also may be within the liver capsule. Postsurgical changes from cholecystectomy. Pancreas: Mildly heterogeneous appearance of the pancreas. Spleen: The spleen is heterogeneous in appearance  which is possibly sequelae of multifocal infarction. Adrenals/Urinary Tract: Right upper pole renal cyst  is not significantly changed. There is a small amount of fluid anterior to the left kidney which may be postsurgical. No hydronephrosis. Urinary bladder is decompressed. A urinary catheter is present. Stomach/Bowel: An enteric tube is present within the stomach. Postsurgical changes from hiatal hernia repair. A moderate volume of well-formed stool material is present within the colon. Colonic diverticulosis. Lymphatic: Nothing significant. Reproductive: Status post hysterectomy. No adnexal masses. Other: Postsurgical changes in the ventral abdominal wall. Mild stranding is present in the flank soft tissues. Musculoskeletal: Postsurgical changes from posterior spinal fusion of the lower lumbar spine. Associated degenerative spondylosis. IMPRESSION: 1. There is no evidence of arterial extravasation into the GI tract. 2. A posterior mediastinal hematoma is favored, likely within the surgical bed which measures approximately 8.3 x 5.4 cm and may account for the patient's decrease in hematocrit. 3. Postsurgical changes from hiatal hernia repair, laparotomy, and splenic artery ligation. Fluid is present within the distal esophagus which suggests flow limitation (possibly secondary to swelling/hematoma). 4. Partial splenic infarct. 5. A small fluid collection is present anterior to the left hemi liver which is indeterminate. This measures approximately 1.5 x 4.4 cm. Electronically Signed   By: Reagan Camera M.D.   On: 07/05/2023 12:22    Anti-infectives: Anti-infectives (From admission, onward)    Start     Dose/Rate Route Frequency Ordered Stop   06/30/23 1300  ciprofloxacin  (CIPRO ) tablet 500 mg        500 mg Oral 2 times daily 06/30/23 1213 07/02/23 2027   06/29/23 0600  ceFAZolin  (ANCEF ) IVPB 2g/100 mL premix        2 g 200 mL/hr over 30 Minutes Intravenous On call to O.R. 06/29/23 0551 06/29/23 0800       Assessment/Plan: s/p Procedure(s) with comments: ATTEMPTED XI ROBOTIC ASSISTED HIATAL HERNIA  REPAIR WITH FUNDOPLICATION AND MESH (N/A) - NO MESH INSERTED LAPAROTOMY, EXPLORATORY W/LIGATION SPLENIC ARTERY AND GASTRIC TUBE PLACEMENT (N/A) -COn't PT -CIR today if bed available   LOS: 7 days    Shela Derby 07/06/2023

## 2023-07-07 DIAGNOSIS — S27892D Contusion of other specified intrathoracic organs, subsequent encounter: Secondary | ICD-10-CM

## 2023-07-07 DIAGNOSIS — D72829 Elevated white blood cell count, unspecified: Secondary | ICD-10-CM

## 2023-07-07 DIAGNOSIS — E162 Hypoglycemia, unspecified: Secondary | ICD-10-CM | POA: Diagnosis not present

## 2023-07-07 DIAGNOSIS — R5381 Other malaise: Secondary | ICD-10-CM | POA: Diagnosis not present

## 2023-07-07 DIAGNOSIS — R059 Cough, unspecified: Secondary | ICD-10-CM

## 2023-07-07 LAB — CBC WITH DIFFERENTIAL/PLATELET
Abs Immature Granulocytes: 0.26 10*3/uL — ABNORMAL HIGH (ref 0.00–0.07)
Basophils Absolute: 0.1 10*3/uL (ref 0.0–0.1)
Basophils Relative: 1 %
Eosinophils Absolute: 0.1 10*3/uL (ref 0.0–0.5)
Eosinophils Relative: 1 %
HCT: 34.3 % — ABNORMAL LOW (ref 36.0–46.0)
Hemoglobin: 11.6 g/dL — ABNORMAL LOW (ref 12.0–15.0)
Immature Granulocytes: 2 %
Lymphocytes Relative: 12 %
Lymphs Abs: 1.6 10*3/uL (ref 0.7–4.0)
MCH: 29.9 pg (ref 26.0–34.0)
MCHC: 33.8 g/dL (ref 30.0–36.0)
MCV: 88.4 fL (ref 80.0–100.0)
Monocytes Absolute: 1.4 10*3/uL — ABNORMAL HIGH (ref 0.1–1.0)
Monocytes Relative: 11 %
Neutro Abs: 9.8 10*3/uL — ABNORMAL HIGH (ref 1.7–7.7)
Neutrophils Relative %: 73 %
Platelets: 417 10*3/uL — ABNORMAL HIGH (ref 150–400)
RBC: 3.88 MIL/uL (ref 3.87–5.11)
RDW: 16.4 % — ABNORMAL HIGH (ref 11.5–15.5)
WBC: 13.3 10*3/uL — ABNORMAL HIGH (ref 4.0–10.5)
nRBC: 0.2 % (ref 0.0–0.2)

## 2023-07-07 LAB — COMPREHENSIVE METABOLIC PANEL WITH GFR
ALT: 43 U/L (ref 0–44)
AST: 26 U/L (ref 15–41)
Albumin: 2.6 g/dL — ABNORMAL LOW (ref 3.5–5.0)
Alkaline Phosphatase: 67 U/L (ref 38–126)
Anion gap: 11 (ref 5–15)
BUN: 23 mg/dL (ref 8–23)
CO2: 24 mmol/L (ref 22–32)
Calcium: 8.5 mg/dL — ABNORMAL LOW (ref 8.9–10.3)
Chloride: 103 mmol/L (ref 98–111)
Creatinine, Ser: 0.66 mg/dL (ref 0.44–1.00)
GFR, Estimated: >60 mL/min (ref >60)
Glucose, Bld: 113 mg/dL — ABNORMAL HIGH (ref 70–99)
Potassium: 3.7 mmol/L (ref 3.5–5.1)
Sodium: 138 mmol/L (ref 135–145)
Total Bilirubin: 1.5 mg/dL — ABNORMAL HIGH (ref 0.0–1.2)
Total Protein: 5.4 g/dL — ABNORMAL LOW (ref 6.5–8.1)

## 2023-07-07 LAB — GLUCOSE, CAPILLARY
Glucose-Capillary: 109 mg/dL — ABNORMAL HIGH (ref 70–99)
Glucose-Capillary: 117 mg/dL — ABNORMAL HIGH (ref 70–99)
Glucose-Capillary: 124 mg/dL — ABNORMAL HIGH (ref 70–99)
Glucose-Capillary: 126 mg/dL — ABNORMAL HIGH (ref 70–99)
Glucose-Capillary: 98 mg/dL (ref 70–99)

## 2023-07-07 MED ORDER — METHOCARBAMOL 500 MG PO TABS
500.0000 mg | ORAL_TABLET | Freq: Three times a day (TID) | ORAL | Status: DC
  Administered 2023-07-07 – 2023-07-08 (×4): 500 mg via ORAL
  Filled 2023-07-07 (×4): qty 1

## 2023-07-07 MED ORDER — FUROSEMIDE 10 MG/ML IJ SOLN
40.0000 mg | Freq: Once | INTRAMUSCULAR | Status: DC
  Filled 2023-07-07: qty 4

## 2023-07-07 MED ORDER — DIPHENHYDRAMINE HCL 25 MG PO CAPS
25.0000 mg | ORAL_CAPSULE | Freq: Four times a day (QID) | ORAL | Status: DC | PRN
  Administered 2023-07-08 – 2023-07-11 (×3): 25 mg via ORAL
  Filled 2023-07-07 (×5): qty 1

## 2023-07-07 MED ORDER — POTASSIUM CHLORIDE CRYS ER 20 MEQ PO TBCR
20.0000 meq | EXTENDED_RELEASE_TABLET | Freq: Two times a day (BID) | ORAL | Status: AC
Start: 2023-07-07 — End: 2023-07-07
  Administered 2023-07-07 (×2): 20 meq via ORAL
  Filled 2023-07-07 (×2): qty 1

## 2023-07-07 MED ORDER — FUROSEMIDE 20 MG PO TABS: 20.0000 mg | ORAL_TABLET | Freq: Every day | ORAL | Status: DC

## 2023-07-07 MED ORDER — POTASSIUM CHLORIDE CRYS ER 20 MEQ PO TBCR
20.0000 meq | EXTENDED_RELEASE_TABLET | Freq: Every day | ORAL | Status: DC
  Administered 2023-07-08: 20 meq via ORAL
  Filled 2023-07-07: qty 1

## 2023-07-07 MED ORDER — FUROSEMIDE 40 MG PO TABS
40.0000 mg | ORAL_TABLET | Freq: Once | ORAL | Status: AC
  Administered 2023-07-07: 40 mg via ORAL
  Filled 2023-07-07: qty 1

## 2023-07-07 MED ORDER — FUROSEMIDE 20 MG PO TABS
20.0000 mg | ORAL_TABLET | Freq: Every day | ORAL | Status: AC
  Administered 2023-07-08 – 2023-07-14 (×7): 20 mg via ORAL
  Filled 2023-07-07 (×7): qty 1

## 2023-07-07 NOTE — Progress Notes (Signed)
 This RN notified PA of delay of order US  d/t immobility. Vascular states department is running 3-4 days behind.

## 2023-07-07 NOTE — Progress Notes (Signed)
 CXR with evidence of CHF and pleural effusions. Will diurise with 40 mg Lasix with po Kdur for supplement X 1. Strict I/o and Daily weights ordered. Aaron Aas PVRs 4 cc to 85 cc. Had small BM.  Has BMET and LFTs ordered tomorrow for follow up on lytes/bilirubin.

## 2023-07-07 NOTE — Progress Notes (Signed)
 Attempted to call vascular ultrasound for ultrasound order of LE, did not answer.

## 2023-07-07 NOTE — Evaluation (Signed)
 Occupational Therapy Assessment and Plan  Patient Details  Name: Jade Cardenas MRN: [id number] Date of Birth: 1950-04-28  OT Diagnosis: abnormal posture, acute pain, and muscle weakness (generalized) Rehab Potential: Rehab Potential (ACUTE ONLY): Good ELOS: 12-14 days   Today's Date: 07/07/2023 OT Individual Time: 7829-5621 Session 1; [phone] Session 2 OT Individual Time Calculation (min): 74 min , 58 min   Hospital Problem: Principal Problem:   Debility   Past Medical History:  Past Medical History:  Diagnosis Date   Anemia    iron def   Arthritis    rheumatoid, psoriatic, osteoarthritis    Bone spur    RIGHT FOOT    Broken toe    Bruises easily    Cellulitis    Chronic back pain    COLONIC POLYPS, HX OF 02/09/2007   Cough, persistent 01/21/2012   poss from acei    DES exposure in utero, unknown    Femur fracture, left (HCC)    Fibromyalgia    doesn't require meds   GASTRIC ULCER, ACUTE, HEMORRHAGE, HX OF 02/09/2007   GERD (gastroesophageal reflux disease)    takes Nexium  daily   Goiter    H/O hiatal hernia    H/O: hysterectomy    when 73 yrs old   Headache    Occasional migraines with increased stress   Hx of seasonal allergies    takes Claritin  daily   HYPERLIPIDEMIA 12/29/2006   takes Lovastatin  nightly   HYPERTENSION 12/29/2006   takes Enalapril  daily   IBS 04/28/2007   Insomnia    related to pain;takes Flexeril  and Tylenol  PM nightly   Joint pain    Joint swelling    Neuropathy    small cell   Osteopenia    Palpitations 02/09/2007   PONV (postoperative nausea and vomiting)    NAUSEA   REDUCTION MAMMOPLASTY, HX OF 02/09/2007   Right bundle branch block 02/09/2007   S/P lumbar fusion 6 13 09/03/2011   L4 L5  posterior    SYNCOPE 12/07/2007   Tendonitis    RLE    UNS ADVRS EFF OTH RX MEDICINAL\T\BIOLOGICAL SBSTNC 02/09/2007   Past Surgical History:  Past Surgical History:  Procedure Laterality Date   BACK SURGERY  12+yrs ago   Synovial  Cyst removal   BACK SURGERY  08/2011   lumbar L4-L5 fusion, lamenectomy   bone spur  6-35yrs ago   right ankle   BUNIONECTOMY  10/03/2014   right foot   BUNIONECTOMY Left    CERVICAL FUSION     2 12    CHOLECYSTECTOMY  1996   HARDWARE REMOVAL Left 08/01/2018   Procedure: Left knee hardware removal;  Surgeon: Liliane Rei, MD;  Location: [facility];  Service: Orthopedics;  Laterality: Left;    LAPAROTOMY N/A 06/29/2023   Procedure: LAPAROTOMY, EXPLORATORY W/LIGATION SPLENIC ARTERY AND GASTRIC TUBE PLACEMENT;  Surgeon: Shela Derby, MD;  Location: [facility] OR;  Service: General;  Laterality: N/A;   POSTERIOR CERVICAL LAMINECTOMY Left 07/30/2015   Procedure: Laminectomy and Foraminotomy - left - Cervical two -Cervical three;  Surgeon: Agustina Aldrich, MD;  Location: [facility];  Service: Neurosurgery;  Laterality: Left;   REDUCTION MAMMAPLASTY Bilateral 1989   TONSILLECTOMY     as a child   TOTAL ABDOMINAL HYSTERECTOMY W/ BILATERAL SALPINGOOPHORECTOMY  1986   TOTAL KNEE ARTHROPLASTY N/A 06/21/2017   Procedure: LEFT TOTAL KNEE ARTHROPLASTY AND RIGHT KNEE CORTISONE INJECTION;  Surgeon: Liliane Rei, MD;  Location: [facility];  Service: Orthopedics;  Laterality:  N/A;   XI ROBOTIC ASSISTED HIATAL HERNIA REPAIR N/A 06/29/2023   Procedure: ATTEMPTED XI ROBOTIC ASSISTED HIATAL HERNIA REPAIR WITH FUNDOPLICATION AND MESH;  Surgeon: Shela Derby, MD;  Location: [facility] OR;  Service: General;  Laterality: N/A;  NO MESH INSERTED    Assessment & Plan Clinical Impression: Jade Cardenas is a 73 year old female with history of  HTN, PUD, chronic LBP, RA, psoriatic arthritis, anemia, large HH with Cameron's ulcers and iron deficiency anemia with early satiety and reflux who was admitted on 06/29/23 for attempted Lap fundoplication by Dr. Melton Squires. Operative course complicated by significant bleeding in mid portion of stomach after stomach reduced and hiatus dissected. She required 2 rounds of CPR due to cardiac  arrest, laparotomy with ligation of splenic artery, 5 units PRBC  and gastric tube placement. She was extubated by 04/30 and  required foley placement for urinary retention. Urecholine  added and foley d/c on 05/05. Elevated WBC felt to be reactive and treated with 3 day course of cipro  for probable UTI. Po intake has been poor and has required IV dextrose  due to hypoglycemic episodes.    SQ heparin  d/c due to recurrent drop in Hgb to 6.8 on 05/03 treated with 2 units PRBC. She has recurrent drop in hgb to 6.1 on 06/04 treated with 2 units. CTA A/P ordered which was negative for extravasation, revealing 8.3 X 5.4 cm posterior mediastinal hematoma likely within surgical bed, post surgical changes with fluid within distal esophagus and partial splenic infarct. Oxygen weaned off but she continues to have chest pain felt to be due to CPR. Intake remains poor.    Therapy consulted and patient noted to be limited by pain and  weakness.  Patient lives alone but has supportive friends who will assist after discharge. She was independent and driving PTA. She currently requires mod assist with ADLs and mod to min assist with mobility. CIR recommended due to functional decline.   Patient currently requires max with basic self-care skills and IADL secondary to muscle weakness, decreased cardiorespiratoy endurance, decreased motor planning, and decreased sitting balance, decreased standing balance, and decreased postural control.  Prior to hospitalization, patient could complete all aspects of A/IADL's and driving with independent  level.   Patient will benefit from skilled intervention to decrease level of assist with basic self-care skills, increase independence with basic self-care skills, and increase level of independence with iADL prior to discharge home with care partner.  Anticipate patient will require mod I with prn friends support/assist  and follow up home health.  OT - End of Session Activity Tolerance:  Tolerates 10 - 20 min activity with multiple rests Endurance Deficit: Yes Endurance Deficit Description: rest breaks between tasks OT Assessment Rehab Potential (ACUTE ONLY): Good OT Barriers to Discharge: Inaccessible home environment;Decreased caregiver support;Wound Care OT Barriers to Discharge Comments: lives alone with 1 STE, has g tube and abdominal incision OT Patient demonstrates impairments in the following area(s): Balance;Endurance;Motor;Pain;Skin Integrity OT Basic ADL's Functional Problem(s): Grooming;Bathing;Dressing;Toileting OT Advanced ADL's Functional Problem(s): Simple Meal Preparation;Laundry;Light Housekeeping OT Transfers Functional Problem(s): Toilet;Tub/Shower OT Additional Impairment(s): Other (comment) (generalize muscle weakness) OT Plan OT Intensity: Minimum of 1-2 x/day, 45 to 90 minutes OT Frequency: 5 out of 7 days OT Duration/Estimated Length of Stay: 12-14 days OT Treatment/Interventions: Balance/vestibular training;Community reintegration;Discharge planning;Disease mangement/prevention;DME/adaptive equipment instruction;Functional mobility training;Pain management;Patient/family education;Self Care/advanced ADL retraining;Skin care/wound managment;Therapeutic Activities;Therapeutic Exercise;UE/LE Strength taining/ROM;Wheelchair propulsion/positioning OT Self Feeding Anticipated Outcome(s): indepedent OT Basic Self-Care Anticipated Outcome(s): mod I OT Toileting  Anticipated Outcome(s): mod I OT Bathroom Transfers Anticipated Outcome(s): mod I OT Recommendation Recommendations for Other Services: Therapeutic Recreation consult Therapeutic Recreation Interventions: Pet therapy;Kitchen group;Stress management;Outing/community reintergration Patient destination: Home Follow Up Recommendations: Home health OT Equipment Recommended: To be determined Equipment Details: has rollator, cane, BSC, shower seat, grab bars, reacher   OT  Evaluation Precautions/Restrictions    Falls, abdominal incision    Pain Pain Assessment Pain Scale: PAINAD Home Living/Prior Functioning Home Living Family/patient expects to be discharged to:: Private residence Living Arrangements: Alone Available Help at Discharge: Friend(s), Available 24 hours/day Type of Home: House Home Access: Stairs to enter Secretary/administrator of Steps: 1 Entrance Stairs-Rails: None Home Layout: One level Bathroom Shower/Tub: Psychologist, counselling, Sport and exercise psychologist: Handicapped height Bathroom Accessibility: Yes  Lives With: Alone IADL History Homemaking Responsibilities: Yes Meal Prep Responsibility: Careers adviser Responsibility: Primary Cleaning Responsibility: Primary Bill Paying/Finance Responsibility: Primary Shopping Responsibility: Primary Current License: Yes Mode of Transportation: Car Education: college Occupation: Retired Type of Occupation: Printmaker Leisure and Hobbies: friends, dog, volunteers Prior Function Level of Independence: Independent with basic ADLs, Independent with homemaking with ambulation, Independent with homemaking with wheelchair, Independent with gait, Independent with transfers  Able to Take Stairs?: Yes Driving: Yes Vocation: Other (Comment) Vocation Requirements: volunteers Vision  See Hydrographic surveyor   See flowsheets  Praxis  See flowsheets  Cognition Cognition Overall Cognitive Status: Within Functional Limits for tasks assessed Orientation Level: Person;Place;Situation Sensation Sensation Light Touch: Appears Intact Hot/Cold: Appears Intact Proprioception: Appears Intact Stereognosis: Appears Intact Coordination Gross Motor Movements are Fluid and Coordinated: No Fine Motor Movements are Fluid and Coordinated: Yes Coordination and Movement Description: d/t balance deficits Finger Nose Finger Test: intact bly 9 Hole Peg Test: 26 sec L (dom) 35 sec R Motor  Motor Motor:  Other (comment) Motor - Skilled Clinical Observations: decreased standing balance  Trunk/Postural Assessment  Cervical Assessment Cervical Assessment: Within Functional Limits Thoracic Assessment Thoracic Assessment: Within Functional Limits Lumbar Assessment Lumbar Assessment: Within Functional Limits Postural Control Postural Control: Deficits on evaluation Righting Reactions: diminished Protective Responses: delayed  Balance Balance Balance Assessed: Yes Static Sitting Balance Static Sitting - Balance Support: Feet supported;No upper extremity supported Static Sitting - Level of Assistance: 5: Stand by assistance Dynamic Sitting Balance Dynamic Sitting - Balance Support: No upper extremity supported;Feet supported Dynamic Sitting - Level of Assistance: 5: Stand by assistance Static Standing Balance Static Standing - Balance Support: Bilateral upper extremity supported Static Standing - Level of Assistance: 4: Min assist Dynamic Standing Balance Dynamic Standing - Balance Support: Bilateral upper extremity supported;During functional activity Dynamic Standing - Level of Assistance: 3: Mod assist Dynamic Standing - Balance Activities: Reaching for objects Extremity/Trunk Assessment RUE Assessment RUE Assessment: Exceptions to [facility] General Strength Comments: 36 lbs grip, 4-/5 RUE Strength RUE Overall Strength: Deficits LUE Assessment LUE Assessment: Exceptions to [facility] General Strength Comments: 40 lbs grip, 4-/5 LUE Strength LUE Overall Strength: Deficits  Care Tool Care Tool Self Care Eating   Eating Assist Level: Set up assist    Oral Care    Oral Care Assist Level: Set up assist    Bathing   Body parts bathed by patient: Right arm;Left arm;Chest;Front perineal area;Left upper leg;Right lower leg;Face Body parts bathed by helper: Buttocks;Abdomen;Left lower leg;Right lower leg   Assist Level: Moderate Assistance - Patient 50 - 74%    Upper Body  Dressing(including orthotics)   What is the patient wearing?: Pull over shirt   Assist Level: Contact Guard/Touching assist  Lower Body Dressing (excluding footwear)   What is the patient wearing?: Underwear/pull up;Pants Assist for lower body dressing: Maximal Assistance - Patient 25 - 49%    Putting on/Taking off footwear   What is the patient wearing?: Shoes Assist for footwear: Maximal Assistance - Patient 25 - 49%       Care Tool Toileting Toileting activity   Assist for toileting: Moderate Assistance - Patient 50 - 74%     Care Tool Bed Mobility Roll left and right activity   Roll left and right assist level: Contact Guard/Touching assist    Sit to lying activity   Sit to lying assist level: Minimal Assistance - Patient > 75%    Lying to sitting on side of bed activity         Care Tool Transfers Sit to stand transfer   Sit to stand assist level: Minimal Assistance - Patient > 75%    Chair/bed transfer   Chair/bed transfer assist level: Minimal Assistance - Patient > 75%     Toilet transfer   Assist Level: Minimal Assistance - Patient > 75%     Care Tool Cognition  Expression of Ideas and Wants Expression of Ideas and Wants: 4. Without difficulty (complex and basic) - expresses complex messages without difficulty and with speech that is clear and easy to understand  Understanding Verbal and Non-Verbal Content Understanding Verbal and Non-Verbal Content: 4. Understands (complex and basic) - clear comprehension without cues or repetitions   Memory/Recall Ability Memory/Recall Ability : Current season;Location of own room;That he or she is in a hospital/hospital unit   Refer to Care Plan for Long Term Goals  SHORT TERM GOAL WEEK 1 OT Short Term Goal 1 (Week 1): Patient will amb with CGA to bathroom with DME/RW OT Short Term Goal 2 (Week 1): Patient will complete LB self care with min A with AE OT Short Term Goal 3 (Week 1): Patient will stand sinkside for 2  minutes for grooming with close S  Recommendations for other services: Therapeutic Recreation  Pet therapy, Kitchen group, Stress management, and Outing/community reintegration   Skilled Therapeutic Intervention ADL ADL ADL Comments: CGA UB, Mod A LB, needs AE training (has reacher and issued LH sponge), min A SPT commode over toilet Mobility  Bed Mobility Bed Mobility: Rolling Right;Supine to Sit Rolling Right: Minimal Assistance - Patient > 75% Supine to Sit: Minimal Assistance - Patient > 75% Transfers Sit to Stand: Minimal Assistance - Patient > 75% Stand to Sit: Minimal Assistance - Patient > 75%  OT Interventions/Treatment:  1st session: Pt seen for full initial OT evaluation and training session this am. Pt in bed reporting need for [facility] upon OT arrival for session.  OT introduced role of therapy and purpose of session.  Assessed UE, vision, sensation, cognition, balance, and throughout session. See functional status for self care, functional mobility and transfers using RW, including tolerating brief standing intervals for LB self care sink side. Limited activity tolerance with rests required between tasks. Abdominal wound/incision with staples intact as well as g-tube. Anticipate with AE and DME patient can progress to mod I level with BADL's, mobility and light IADL's.  OT recommending rehab stay to maximize safety and independence with rec for [facility] and friends support and assistance as able. Pt left at end of session with chair alarm set up in w/c with tray table with needs and nurse call bell within reach.   2nd session:  OT arrived for 2nd session and patient  agreeable to work in [employer]. Assessed short distance w/c skills and patient required min A for 50 ft straight hallway distance. Assessed grip strength and 9HPT as well as progressed standing tolerance with RW with unilateral 2-3 " functional reach max laterally. Issued and trained in [facility] sponge and reacher use for donning  tennis shoes- will require elastic laces and LHSH. From w/c patient able to use RW with min A 5 step pivot to recliner. Left with visitor with all needs, nurse call button and safety measures set.   Pain: no pain reported   Discharge Criteria: Patient will be discharged from OT if patient refuses treatment 3 consecutive times without medical reason, if treatment goals not met, if there is a change in medical status, if patient makes no progress towards goals or if patient is discharged from hospital.  The above assessment, treatment plan, treatment alternatives and goals were discussed and mutually agreed upon: by patient  Merrill Abide 07/07/2023, 12:38 PM

## 2023-07-07 NOTE — Evaluation (Signed)
 Physical Therapy Assessment and Plan  Patient Details  Name: Jade Cardenas MRN: [id number] Date of Birth: 11-Jun-1950  Jade Cardenas Diagnosis: Abnormal posture, Abnormality of gait, Dizziness and giddiness, Edema, Impaired sensation, Muscle weakness, and Pain in abdomen Rehab Potential: Good ELOS: 7-10 days   Today's Date: 07/07/2023 Jade Cardenas Individual Time: 3244-0102 Jade Cardenas Individual Time Calculation (min): 53 min  Today's Date: 07/07/2023 Jade Cardenas Missed Time: 7 Minutes Missed Time Reason: Nursing care (and IV team)   Hospital Problem: Principal Problem:   Debility   Past Medical History:  Past Medical History:  Diagnosis Date   Anemia    iron def   Arthritis    rheumatoid, psoriatic, osteoarthritis    Bone spur    RIGHT FOOT    Broken toe    Bruises easily    Cellulitis    Chronic back pain    COLONIC POLYPS, HX OF 02/09/2007   Cough, persistent 01/21/2012   poss from acei    DES exposure in utero, unknown    Femur fracture, left (HCC)    Fibromyalgia    doesn't require meds   GASTRIC ULCER, ACUTE, HEMORRHAGE, HX OF 02/09/2007   GERD (gastroesophageal reflux disease)    takes Nexium  daily   Goiter    H/O hiatal hernia    H/O: hysterectomy    when 73 yrs old   Headache    Occasional migraines with increased stress   Hx of seasonal allergies    takes Claritin  daily   HYPERLIPIDEMIA 12/29/2006   takes Lovastatin  nightly   HYPERTENSION 12/29/2006   takes Enalapril  daily   IBS 04/28/2007   Insomnia    related to pain;takes Flexeril  and Tylenol  PM nightly   Joint pain    Joint swelling    Neuropathy    small cell   Osteopenia    Palpitations 02/09/2007   PONV (postoperative nausea and vomiting)    NAUSEA   REDUCTION MAMMOPLASTY, HX OF 02/09/2007   Right bundle branch block 02/09/2007   S/P lumbar fusion 6 13 09/03/2011   L4 L5  posterior    SYNCOPE 12/07/2007   Tendonitis    RLE    UNS ADVRS EFF OTH RX MEDICINAL\T\BIOLOGICAL SBSTNC 02/09/2007   Past Surgical History:   Past Surgical History:  Procedure Laterality Date   BACK SURGERY  12+yrs ago   Synovial Cyst removal   BACK SURGERY  08/2011   lumbar L4-L5 fusion, lamenectomy   bone spur  6-29yrs ago   right ankle   BUNIONECTOMY  10/03/2014   right foot   BUNIONECTOMY Left    CERVICAL FUSION     2 12    CHOLECYSTECTOMY  1996   HARDWARE REMOVAL Left 08/01/2018   Procedure: Left knee hardware removal;  Surgeon: Liliane Rei, MD;  Location: [facility];  Service: Orthopedics;  Laterality: Left;    LAPAROTOMY N/A 06/29/2023   Procedure: LAPAROTOMY, EXPLORATORY W/LIGATION SPLENIC ARTERY AND GASTRIC TUBE PLACEMENT;  Surgeon: Shela Derby, MD;  Location: [facility];  Service: General;  Laterality: N/A;   POSTERIOR CERVICAL LAMINECTOMY Left 07/30/2015   Procedure: Laminectomy and Foraminotomy - left - Cervical two -Cervical three;  Surgeon: Agustina Aldrich, MD;  Location: [facility];  Service: Neurosurgery;  Laterality: Left;   REDUCTION MAMMAPLASTY Bilateral 1989   TONSILLECTOMY     as a child   TOTAL ABDOMINAL HYSTERECTOMY W/ BILATERAL SALPINGOOPHORECTOMY  1986   TOTAL KNEE ARTHROPLASTY N/A 06/21/2017   Procedure: LEFT TOTAL KNEE ARTHROPLASTY AND RIGHT KNEE CORTISONE INJECTION;  Surgeon: Liliane Rei, MD;  Location: [facility];  Service: Orthopedics;  Laterality: N/A;   XI ROBOTIC ASSISTED HIATAL HERNIA REPAIR N/A 06/29/2023   Procedure: ATTEMPTED XI ROBOTIC ASSISTED HIATAL HERNIA REPAIR WITH FUNDOPLICATION AND MESH;  Surgeon: Shela Derby, MD;  Location: [facility];  Service: General;  Laterality: N/A;  NO MESH INSERTED    Assessment & Plan Clinical Impression: Patient is a 73 y.o. year old female with history of  HTN, PUD, chronic LBP, RA, psoriatic arthritis, anemia, large HH with Cameron's ulcers and iron deficiency anemia with early satiety and reflux who was admitted on 06/29/23 for attempted Lap fundoplication by Dr. Melton Squires. Operative course complicated by significant bleeding in mid portion of stomach  after stomach reduced and hiatus dissected. She required 2 rounds of CPR due to cardiac arrest, laparotomy with ligation of splenic artery, 5 units PRBC  and gastric tube placement. She was extubated by 04/30 and  required foley placement for urinary retention. Urecholine  added and foley d/c on 05/05. Elevated WBC felt to be reactive and treated with 3 day course of cipro  for probable UTI. Po intake has been poor and has required IV dextrose  due to hypoglycemic episodes.    SQ heparin  d/c due to recurrent drop in Hgb to 6.8 on 05/03 treated with 2 units PRBC. She has recurrent drop in hgb to 6.1 on 06/04 treated with 2 units. CTA A/P ordered which was negative for extravasation, revealing 8.3 X 5.4 cm posterior mediastinal hematoma likely within surgical bed, post surgical changes with fluid within distal esophagus and partial splenic infarct. Oxygen weaned off but she continues to have chest pain felt to be due to CPR. Intake remains poor.    Therapy consulted and patient noted to be limited by pain and  weakness.  Patient lives alone but has supportive friends who will assist after discharge. She was independent and driving PTA. She currently requires mod assist with ADLs and mod to min assist with mobility. CIR recommended due to functional decline.   Patient currently requires min with mobility secondary to muscle weakness, decreased cardiorespiratoy endurance, and decreased standing balance, decreased postural control, and decreased balance strategies.  Prior to hospitalization, patient was independent  with mobility and lived with Alone in a House home.  Home access is 1Stairs to enter.  Patient will benefit from skilled Jade Cardenas intervention to maximize safe functional mobility, minimize fall risk, and decrease caregiver burden for planned discharge home with intermittent assist.  Anticipate patient will benefit from follow up [facility] at discharge.  Jade Cardenas - End of Session Activity Tolerance: Tolerates 30+ min  activity with multiple rests Endurance Deficit: Yes Endurance Deficit Description: rest breaks between tasks Jade Cardenas Assessment Rehab Potential (ACUTE/IP ONLY): Good Jade Cardenas Barriers to Discharge: Decreased caregiver support;Home environment access/layout;Other (comments) Jade Cardenas Barriers to Discharge Comments: pain, dizziness, 1 STE, lives alone but has friends who can assist as needed Jade Cardenas Patient demonstrates impairments in the following area(s): Balance;Behavior;Edema;Endurance;Nutrition;Pain;Skin Integrity Jade Cardenas Transfers Functional Problem(s): Bed Mobility;Bed to Chair;Car;Furniture Jade Cardenas Locomotion Functional Problem(s): Ambulation;Wheelchair Mobility;Stairs Jade Cardenas Plan Jade Cardenas Intensity: Minimum of 1-2 x/day ,45 to 90 minutes Jade Cardenas Frequency: 5 out of 7 days Jade Cardenas Duration Estimated Length of Stay: 7-10 days Jade Cardenas Treatment/Interventions: Ambulation/gait training;Discharge planning;Functional mobility training;Psychosocial support;Therapeutic Activities;Visual/perceptual remediation/compensation;Balance/vestibular training;Disease management/prevention;Neuromuscular re-education;Skin care/wound management;Therapeutic Exercise;Wheelchair propulsion/positioning;DME/adaptive equipment instruction;Pain management;Splinting/orthotics;UE/LE Strength taining/ROM;Community reintegration;Patient/family education;Stair training;UE/LE Coordination activities Jade Cardenas Transfers Anticipated Outcome(s): Mod I with LRAD Jade Cardenas Locomotion Anticipated Outcome(s): Mod I with LRAD Jade Cardenas Recommendation Recommendations for Other Services: Therapeutic Recreation consult Therapeutic  Recreation Interventions: Pet therapy;Stress management Follow Up Recommendations: Outpatient Jade Cardenas Patient destination: Home Equipment Recommended: To be determined Equipment Details: has RW, rollator, and [facility]   Jade Cardenas Evaluation Precautions/Restrictions Precautions Precautions: Fall Restrictions Weight Bearing Restrictions Per Provider Order: No Pain Interference Pain  Interference Pain Effect on Sleep: 4. Almost constantly Pain Interference with Therapy Activities: 2. Occasionally Pain Interference with Day-to-Day Activities: 2. Occasionally Home Living/Prior Functioning Home Living Available Help at Discharge: Friend(s);Available PRN/intermittently Type of Home: House Home Access: Stairs to enter [employer] of Steps: 1 Entrance Stairs-Rails: None Home Layout: One level Bathroom Shower/Tub: Walk-in shower;Door Bathroom Toilet: Handicapped height Bathroom Accessibility: Yes Additional Comments: Jade Cardenas has RW and SPC but rarely uses either one  Lives With: Alone Prior Function Level of Independence: Independent with basic ADLs;Independent with homemaking with ambulation;Independent with homemaking with wheelchair;Independent with gait;Independent with transfers  Able to Take Stairs?: Yes Driving: Yes Vocation: Other (Comment) 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 Vision/Perception  Vision - History Ability to See in Adequate Light: 0 Adequate  Cognition Overall Cognitive Status: Within Functional Limits for tasks assessed Arousal/Alertness: Awake/alert Orientation Level: Oriented X4 Memory: Appears intact Awareness: Appears intact Problem Solving: Appears intact Safety/Judgment: Appears intact Sensation Sensation Light Touch: Appears Intact Hot/Cold: Appears Intact Proprioception: Appears Intact Stereognosis: Not tested Additional Comments: neuropathy from R ankle and below Coordination Gross Motor Movements are Fluid and Coordinated: Yes Fine Motor Movements are Fluid and Coordinated: Yes Coordination and Movement Description: global weakness, deconditioning, and pain limited Finger Nose Finger Test: intact bilaterally Heel Shin Test: [facility] bilaterally 9 Hole Peg Test: 26 sec L (dom) 35 sec R Motor  Motor Motor: Within Functional Limits Motor - Skilled Clinical Observations:  global weakness/deconditioning  Trunk/Postural Assessment  Cervical Assessment Cervical Assessment: Within Functional Limits Thoracic Assessment Thoracic Assessment: Exceptions to [facility] (rounded shoulders) Lumbar Assessment Lumbar Assessment: Exceptions to [facility] (flexible posterior pelvic tilt) Postural Control Postural Control: Within Functional Limits  Balance Balance Balance Assessed: Yes Static Sitting Balance Static Sitting - Balance Support: Feet supported;No upper extremity supported Static Sitting - Level of Assistance: 5: Stand by assistance (supervision) Dynamic Sitting Balance Dynamic Sitting - Balance Support: No upper extremity supported;Feet supported Dynamic Sitting - Level of Assistance: 5: Stand by assistance (supervision) Static Standing Balance Static Standing - Balance Support: Bilateral upper extremity supported;During functional activity (RW) Static Standing - Level of Assistance: 5: Stand by assistance (CGA) Dynamic Standing Balance Dynamic Standing - Balance Support: Bilateral upper extremity supported;During functional activity (RW) Dynamic Standing - Level of Assistance: 4: Min assist Dynamic Standing - Comments: with transfers and ambulation Extremity Assessment  RLE Assessment RLE Assessment: Exceptions to [facility] General Strength Comments: tested sitting EOB RLE Strength Right Hip Flexion: 4-/5 Right Hip ABduction: 4-/5 Right Hip ADduction: 4-/5 Right Knee Flexion: 4/5 Right Knee Extension: 4-/5 Right Ankle Dorsiflexion: 4+/5 Right Ankle Plantar Flexion: 4+/5 LLE Assessment LLE Assessment: Exceptions to [facility] General Strength Comments: tested sitting EOB LLE Strength Left Hip Flexion: 4-/5 Left Hip ABduction: 4-/5 Left Hip ADduction: 4-/5 Left Knee Flexion: 4/5 Left Knee Extension: 4-/5 Left Ankle Dorsiflexion: 4+/5 Left Ankle Plantar Flexion: 4+/5  Care Tool Care Tool Bed Mobility Roll left and right activity   Roll left and right assist level:  Supervision/Verbal cueing    Sit to lying activity        Lying to sitting on side of bed activity   Lying to sitting on side of bed assist level: the ability to  move from lying on the back to sitting on the side of the bed with no back support.: Supervision/Verbal cueing     Care Tool Transfers Sit to stand transfer   Sit to stand assist level: Minimal Assistance - Patient > 75%    Chair/bed transfer   Chair/bed transfer assist level: Minimal Assistance - Patient > 75%    Car transfer   Car transfer assist level: Minimal Assistance - Patient > 75%      Care Tool Locomotion Ambulation   Assist level: Minimal Assistance - Patient > 75% Assistive device: Walker-rolling Max distance: 62ft  Walk 10 feet activity Walk 10 feet activity did not occur: Safety/medical concerns (dizziness, pain)       Walk 50 feet with 2 turns activity Walk 50 feet with 2 turns activity did not occur: Safety/medical concerns (dizziness, pain)      Walk 150 feet activity Walk 150 feet activity did not occur: Safety/medical concerns (dizziness, pain)      Walk 10 feet on uneven surfaces activity Walk 10 feet on uneven surfaces activity did not occur: Safety/medical concerns (dizziness, pain)      Stairs Stair activity did not occur: Safety/medical concerns (dizziness, pain)        Walk up/down 1 step activity Walk up/down 1 step or curb (drop down) activity did not occur: Safety/medical concerns (dizziness, pain)      Walk up/down 4 steps activity Walk up/down 4 steps activity did not occur: Safety/medical concerns (dizziness, pain)      Walk up/down 12 steps activity Walk up/down 12 steps activity did not occur: Safety/medical concerns (dizziness, pain)      Pick up small objects from floor Pick up small object from the floor (from standing position) activity did not occur: Safety/medical concerns (dizziness, pain)      Wheelchair Is the patient using a wheelchair?: Yes Type of Wheelchair:  Manual   Wheelchair assist level: Dependent - Patient 0% Max wheelchair distance: 195ft  Wheel 50 feet with 2 turns activity   Assist Level: Dependent - Patient 0%  Wheel 150 feet activity   Assist Level: Dependent - Patient 0%    Refer to Care Plan for Long Term Goals  SHORT TERM GOAL WEEK 1 Jade Cardenas Short Term Goal 1 (Week 1): STG=LTG due to LOS  Recommendations for other services: None   Skilled Therapeutic Intervention Evaluation completed (see details above and below) with education on Jade Cardenas POC and goals and individual treatment initiated with focus on functional mobility/transfers, generalized strengthening and endurance, dynamic standing balance/coordination, simulated car transfers, and ambulation. Received Jade Cardenas supine in bed asleep, Jade Cardenas educated on Jade Cardenas evaluation, CIR policies, and therapy schedule and agreeable. Jade Cardenas reported pain 8/10 in abdomen - RN notified of request for pain medication.   Jade Cardenas transferred supine<>sitting L EOB from flat bed with supervision using bedrails with cues for logroll technique due to abdominal discomfort. Jade Cardenas reported increased dizziness upon sitting EOB, that decreased with time - donned shoes with supervision. Jade Cardenas requesting to use RW with mobility, despite encouragement to attempt without. Stood with RW and min A and ambulated 92ft with RW and CGA/min A - limited by dizziness. BP: 145/61 with no improvement in symptoms.   Jade Cardenas transported to/from in [facility] dependently for time management purposes. Jade Cardenas performed simulated car transfer without AD and min A but continued to report dizziness - Jade Cardenas politely declined any further ambulation/stairs. Returned to room and PA, Pam, present briefly. Jade Cardenas performed the following seated exercises with emphasis  on LE strength/ROM: -hip abduction with red TB 2x20 -hip flexion with red TB 2x15 bilaterally RN arrived to administer medication and requested Jade Cardenas return to bed for IV team - stand<>pivot WC<>bed with RW and min A. Concluded session  with Jade Cardenas sitting EOB. RN and IV team present at bedside attending to care. 7 minutes missed of skilled physical therapy.  Mobility Bed Mobility Bed Mobility: Rolling Left;Supine to Sit Rolling Right: Supervision/verbal cueing Supine to Sit: Supervision/Verbal cueing Transfers Transfers: Sit to Stand;Stand to Sit;Stand Pivot Transfers Sit to Stand: Minimal Assistance - Patient > 75% Stand to Sit: Contact Guard/Touching assist Stand Pivot Transfers: Minimal Assistance - Patient > 75% Transfer (Assistive device): Rolling walker Locomotion  Gait Ambulation: Yes Gait Assistance: Minimal Assistance - Patient > 75% Gait Distance (Feet): 9 Feet Assistive device: Rolling walker Gait Gait: Yes Gait Pattern: Impaired Gait Pattern: Decreased step length - left;Decreased step length - right;Poor foot clearance - left;Poor foot clearance - right;Narrow base of support Gait velocity: decreased Wheelchair Mobility Wheelchair Mobility: Yes Wheelchair Assistance: Dependent - Patient 0% Wheelchair Parts Management: Needs assistance Distance: 143ft   Discharge Criteria: Patient will be discharged from Jade Cardenas if patient refuses treatment 3 consecutive times without medical reason, if treatment goals not met, if there is a change in medical status, if patient makes no progress towards goals or if patient is discharged from hospital.  The above assessment, treatment plan, treatment alternatives and goals were discussed and mutually agreed upon: by patient  Jade Cardenas Jade Cardenas Cardenas Jade Cardenas, Jade Cardenas 07/07/2023, 2:46 PM

## 2023-07-07 NOTE — Patient Care Conference (Signed)
 Inpatient RehabilitationTeam Conference and Plan of Care Update Date: 07/07/2023   Time: 12:09 PM    Patient Name: Jade Cardenas      Medical Record Number: [id number]  Date of Birth: [birth year] Sex: Female         Room/Bed: 4W05C/4W05C-01 Payor Info: Payor: MEDICARE / Plan: MEDICARE PART A AND B / Product Type: *No Product type* /    Admit Date/Time:  07/06/2023  2:21 PM  Primary Diagnosis:  Debility  Hospital Problems: Principal Problem:   Debility    Expected Discharge Date: Expected Discharge Date:  (evals pending)  Team Members Present: Physician leading conference: Dr. Lylia Sand Social Worker Present: Adrianna Albee, LCSW Nurse Present: Forrestine Ike, RN PT Present: Jenney Modest, PT OT Present: Carollee Circle, OT PPS Coordinator present : Jestine Moron, SLP     Current Status/Progress Goal Weekly Team Focus  Bowel/Bladder   Continent of bowel and bladder. LBm 5/6   PT remains free from S/s of constipation   Offer toileting q 2 hours and PRN. Continue PVR bladder scans    Swallow/Nutrition/ Hydration               ADL's   min A/CG A UB, mod A LB selfcare, min A commode transfer with RW, fatigues easily   mod I   progress AE use d/t abdominal staples on wound for LB reach, progress RW for functional amb and standing tolerance for ADL's, lives alone ut has excellent friend support    Customer service manager Observations               Pain   C/O of abdominal pain and generalized pain   pt states pain level <3 out of 10   Assess pain q shift and PRn. Administer pain medication per orders.    Skin   Surgical incisions to abdomen, stapled OTA   Monitor for s/s of infection  assess skin q shift and PRN.      Discharge Planning:  HOme with friends who will assist and pt plans to be mod/i at DC. Hopes for short length of stay   Team Discussion: Patient admitted with debility  post hiatal hernia repair and cardiac arrest. Functional progress limited by fatigue.  Patient on target to meet rehab goals: yes, needs min assist for upper body care and mod assist for lower body care. Goals for discharge set for mod I overall.  *See Care Plan and progress notes for long and short-term goals.   Revisions to Treatment Plan:  N/a   Teaching Needs: Safety, medications, tranfers, toileting, etc.   Current Barriers to Discharge: Decreased caregiver support  Possible Resolutions to Barriers: Support group education     Medical Summary Current Status: Debility, Hematoma, anemia, constipatoin, chest wall pain, hypoglycemia, RA/Psoriatic arthritis, urinary retention  Barriers to Discharge: Medical stability;Self-care education;Inadequate Nutritional Intake;Volume Overload  Barriers to Discharge Comments: Debility, Hematoma, anemia, constipatoin, chest wall pain, hypoglycemia, RA/Psoriatic arthritis, urinary retention Possible Resolutions to Barriers/Weekly Focus: monitor bladder function, monitor bowel function, adjust cough medication, lasix   Continued Need for Acute Rehabilitation Level of Care: The patient requires daily medical management by a physician with specialized training in physical medicine and rehabilitation for the following reasons: Direction of a multidisciplinary physical rehabilitation program to maximize functional independence : Yes Medical management of  patient stability for increased activity during participation in an intensive rehabilitation regime.: Yes Analysis of laboratory values and/or radiology reports with any subsequent need for medication adjustment and/or medical intervention. : Yes   I attest that I was present, lead the team conference, and concur with the assessment and plan of the team.   Forrestine Ike B 07/07/2023, 2:49 PM

## 2023-07-07 NOTE — Progress Notes (Signed)
 PROGRESS NOTE   Subjective/Complaints: Pt was frustrated she was woken up early in the morning to check blood sugar. She asks about monthly golimumab  injection -she brought this from home.  She reports Robitussin is not effective, she was started on Delsym and this worked better.  She uses Tylenol  PM at night for pain and sleep at home.  She says she is doing much better today.  LBM today   ROS: Patient denies fever, new vision changes, dizziness, nausea, vomiting, diarrhea,  shortness of breath or chest pain, headache, or mood change. + chronic joint pain   Objective:   DG Chest 2 View Result Date: 07/06/2023 CLINICAL DATA:  [id number] Hemoptysis 132440 EXAM: CHEST - 2 VIEW COMPARISON:  07/03/2023 FINDINGS: Cardiac silhouette is prominent. There is pulmonary interstitial prominence with vascular congestion. No focal consolidation. No pneumothorax. Moderate bilateral pleural effusions. IMPRESSION: Findings suggest CHF. Electronically Signed   By: Sydell Eva M.D.   On: 07/06/2023 19:12   Recent Labs    07/05/23 0921 07/07/23 0510  WBC 17.1* 13.3*  HGB 13.4 11.6*  HCT 39.0 34.3*  PLT 272 417*   Recent Labs    07/05/23 0921 07/07/23 0510  NA 138 138  K 4.4 3.7  CL 103 103  CO2 22 24  GLUCOSE 219* 113*  BUN 24* 23  CREATININE 0.74 0.66  CALCIUM  8.9 8.5*    Intake/Output Summary (Last 24 hours) at 07/07/2023 1210 Last data filed at 07/07/2023 1027 Gross per 24 hour  Intake 656 ml  Output 4 ml  Net 652 ml        Physical Exam: Vital Signs Blood pressure (!) 156/85, pulse 88, temperature 98.7 F (37.1 C), resp. rate 18, last menstrual period 03/02/1984, SpO2 97%.  General: No apparent distress HEENT: Head is normocephalic, atraumatic, sclera anicteric, oral mucosa pink and moist Neck: Supple without JVD or lymphadenopathy Heart: Reg rate and rhythm. No murmurs rubs or gallops. + chest wall TTP Chest: CTA  bilaterally without wheezes, rales, or rhonchi; no distress Abdomen: Midline incision as well as multiple smaller incisions C/D/I with staples in place. Gastrostomy tube site with crusted blood--non tender.   Extremities: No clubbing, cyanosis, or edema. Pulses are 2+ Psych: Pt's affect is appropriate. Pt is cooperative Skin: Ecchymotic area LUE at site of prior IV?    Neuro:   Alert and oriented x 3. Normal insight and awareness. Intact Memory. Normal language and speech. Cranial nerve exam unremarkable. MMT: BUE 4-/5 proximally to 4/5 distally. BLE 3+/5 HF, 4/5 KE and 4+/5 ADF/PF. Sensory exam normal for light touch and pain in all 4 limbs. No limb ataxia or cerebellar signs. No abnormal tone appreciated.  .     SENSORY: Normal to touch all 4 extremities  Coordination: Normal finger to nose and heel to shin, no tremor, no dysmetria   MSK: Chronic arthritic changes in hands/fingers.     Assessment/Plan: 1. Functional deficits which require 3+ hours per day of interdisciplinary therapy in a comprehensive inpatient rehab setting. Physiatrist is providing close team supervision and 24 hour management of active medical problems listed below. Physiatrist and rehab team continue to assess barriers to discharge/monitor patient progress  toward functional and medical goals  Care Tool:  Bathing    Body parts bathed by patient: Right arm, Left arm, Chest, Front perineal area, Left upper leg, Right lower leg, Face   Body parts bathed by helper: Buttocks, Abdomen, Left lower leg, Right lower leg     Bathing assist Assist Level: Moderate Assistance - Patient 50 - 74%     Upper Body Dressing/Undressing Upper body dressing   What is the patient wearing?: Pull over shirt    Upper body assist Assist Level: Contact Guard/Touching assist    Lower Body Dressing/Undressing Lower body dressing      What is the patient wearing?: Underwear/pull up, Pants     Lower body assist Assist for lower  body dressing: Maximal Assistance - Patient 25 - 49%     Toileting Toileting    Toileting assist Assist for toileting: Moderate Assistance - Patient 50 - 74%     Transfers Chair/bed transfer  Transfers assist     Chair/bed transfer assist level: Minimal Assistance - Patient > 75%     Locomotion Ambulation   Ambulation assist              Walk 10 feet activity   Assist           Walk 50 feet activity   Assist           Walk 150 feet activity   Assist           Walk 10 feet on uneven surface  activity   Assist           Wheelchair     Assist               Wheelchair 50 feet with 2 turns activity    Assist            Wheelchair 150 feet activity     Assist          Blood pressure (!) 156/85, pulse 88, temperature 98.7 F (37.1 C), resp. rate 18, last menstrual period 03/02/1984, SpO2 97%.   Medical Problem List and Plan: 1. Functional deficits secondary to debility after hiatal hernia repair/fundoplication/splenic artery repair and subsequent complications             -patient may shower             -ELOS/Goals: 10-12 days, supervision goals with PT and OT  -Admit to CIR  -Team conference today please see physician documentation under team conference tab, met with team  to discuss problems,progress, and goals. Formulized individual treatment plan based on medical history, underlying problem and comorbidities.   2.  Antithrombotics: -DVT/anticoagulation:  Mechanical: Sequential compression devices, below knee Bilateral lower extremities due to hematoma/bleeding.              -antiplatelet therapy: N/A 3. Pain Management: Oxycodone  prn severe pain and ultram  prn moderate pain             --decrease tylenol  to 650 mg qid.  4. Mood/Behavior/Sleep: LCSW to follow for evaluation and support.             --team to provide encouragement/ego support.              -antipsychotic agents: N/A  -5/7 patient would  like to use as diphenhydramine  as needed for sleep, will DC trazodone for now.  She uses Tylenol  PM at home which is Tylenol  and diphenhydramine  5. Neuropsych/cognition: This patient is capable of making decisions on  her own behalf. 6. Skin/Wound Care: Routine pressure relief measures. Monitor incisions for healing. No dressings needed 7. Fluids/Electrolytes/Nutrition: Monitor I/O. Change breeze to ensure to avoid GERD             --friends to bring in food/supplements from home             --will continue to monitor for hypoglycemic episode.   5/7 will DC CBG in am, continue lunch,dinner and HS 8. Acute on chronic anemia: Has had rise in H/H from 6.1-->13.4 question accurate. Transfused with 4 units post op. --Will recheck in am  --was getting Iron infusions by Dr. Salomon Cree.  Her hematologist was notified about her admission  5/7 HGB stable 11.6 9. Mediastinal hematoma: Had episode of hematemesis vs hemoptysis last night (isolated) --Will check CXR for follow up.  **CXR reviewed, ?CHF, has EF of 65%, chest clear on exam             -follow weights. Monitor clinically for now             -she's already a little dry by BUN             -re check labs in AM -5/7 will give lasix, 40mg  today and 20mg  daily, and potassium supplement. BUN/Cr stable-continue to monitor. Continue Delsym for cough prn 10. Chest wall pain: Likely due to CPR. Continue to encourage deep breathing/cough every hour 11. Persistent leucocytosis: Monitor for fevers and signs of infection             --WBC has trended in 14-18 range.              -afebrile at present  -5/7 WBC down to 13.3  Episode of Hematemesis/Nocturnal cough: Continues to have chest pain 12. Constipation: Has not had BM since last week?Aaron Aas Moderate stool noted in colon on X rays.  and relays abdominal fullness.               --will add senna to colace              -does realize that her po intake needs to pick up as well 13. Urinary retention:  Has voided  twice since foley came out yesterday. Needs to drink more             --will monitor voiding with bladder scans. 14. Pre-renal azotemia: Encourage fluid/po intake.  15. RA/Psoriatic arthritis: Reports diffuse pain for past month (due to need to hold meds pre-op)   --Has been cleared to resume Methotrexate  (today) and Simponi  (friend to bring in tomorrow)    LOS: 1 days A FACE TO FACE EVALUATION WAS PERFORMED  Jade Cardenas 07/07/2023, 12:10 PM

## 2023-07-07 NOTE — Progress Notes (Signed)
 Inpatient Rehabilitation Care Coordinator Assessment and Plan Patient Details  Name: Jade Cardenas MRN: [id number] Date of Birth: [birth year]  Today's Date: 07/07/2023  Hospital Problems: Principal Problem:   Debility  Past Medical History:  Past Medical History:  Diagnosis Date   Anemia    iron def   Arthritis    rheumatoid, psoriatic, osteoarthritis    Bone spur    RIGHT FOOT    Broken toe    Bruises easily    Cellulitis    Chronic back pain    COLONIC POLYPS, HX OF 02/09/2007   Cough, persistent 01/21/2012   poss from acei    DES exposure in utero, unknown    Femur fracture, left (HCC)    Fibromyalgia    doesn't require meds   GASTRIC ULCER, ACUTE, HEMORRHAGE, HX OF 02/09/2007   GERD (gastroesophageal reflux disease)    takes Nexium  daily   Goiter    H/O hiatal hernia    H/O: hysterectomy    when 73 yrs old   Headache    Occasional migraines with increased stress   Hx of seasonal allergies    takes Claritin  daily   HYPERLIPIDEMIA 12/29/2006   takes Lovastatin  nightly   HYPERTENSION 12/29/2006   takes Enalapril  daily   IBS 04/28/2007   Insomnia    related to pain;takes Flexeril  and Tylenol  PM nightly   Joint pain    Joint swelling    Neuropathy    small cell   Osteopenia    Palpitations 02/09/2007   PONV (postoperative nausea and vomiting)    NAUSEA   REDUCTION MAMMOPLASTY, HX OF 02/09/2007   Right bundle branch block 02/09/2007   S/P lumbar fusion 6 13 09/03/2011   L4 L5  posterior    SYNCOPE 12/07/2007   Tendonitis    RLE    UNS ADVRS EFF OTH RX MEDICINAL\T\BIOLOGICAL SBSTNC 02/09/2007   Past Surgical History:  Past Surgical History:  Procedure Laterality Date   BACK SURGERY  12+yrs ago   Synovial Cyst removal   BACK SURGERY  08/2011   lumbar L4-L5 fusion, lamenectomy   bone spur  6-51yrs ago   right ankle   BUNIONECTOMY  10/03/2014   right foot   BUNIONECTOMY Left    CERVICAL FUSION     2 12    CHOLECYSTECTOMY  1996   HARDWARE  REMOVAL Left 08/01/2018   Procedure: Left knee hardware removal;  Surgeon: Liliane Rei, MD;  Location: [facility];  Service: Orthopedics;  Laterality: Left;    LAPAROTOMY N/A 06/29/2023   Procedure: LAPAROTOMY, EXPLORATORY W/LIGATION SPLENIC ARTERY AND GASTRIC TUBE PLACEMENT;  Surgeon: Shela Derby, MD;  Location: [facility] OR;  Service: General;  Laterality: N/A;   POSTERIOR CERVICAL LAMINECTOMY Left 07/30/2015   Procedure: Laminectomy and Foraminotomy - left - Cervical two -Cervical three;  Surgeon: Agustina Aldrich, MD;  Location: [facility];  Service: Neurosurgery;  Laterality: Left;   REDUCTION MAMMAPLASTY Bilateral 1989   TONSILLECTOMY     as a child   TOTAL ABDOMINAL HYSTERECTOMY W/ BILATERAL SALPINGOOPHORECTOMY  1986   TOTAL KNEE ARTHROPLASTY N/A 06/21/2017   Procedure: LEFT TOTAL KNEE ARTHROPLASTY AND RIGHT KNEE CORTISONE INJECTION;  Surgeon: Liliane Rei, MD;  Location: [facility];  Service: Orthopedics;  Laterality: N/A;   XI ROBOTIC ASSISTED HIATAL HERNIA REPAIR N/A 06/29/2023   Procedure: ATTEMPTED XI ROBOTIC ASSISTED HIATAL HERNIA REPAIR WITH FUNDOPLICATION AND MESH;  Surgeon: Shela Derby, MD;  Location[facility];  Service: General;  Laterality: N/A;  NO  MESH INSERTED   Social History:  reports that she has never smoked. She has never been exposed to tobacco smoke. She has never used smokeless tobacco. She reports that she does not drink alcohol and does not use drugs.  Family / Support Systems Marital Status: Widow/Widower Patient Roles: Other (Comment) (retired/friend) Other Supports: Kay-friend [phone]  Jackie-friend [phone] Ronda-friend [phone] Anticipated Caregiver: Friends Ability/Limitations of Caregiver: Will not have 24/7 care Caregiver Availability: Intermittent Family Dynamics: Close with friends and can rely on them to assist if needed. Pt is doing well and will not be here long  Social History Preferred language: English Religion: Jewish Cultural Background:  NA Education: Probation officer - How often do you need to have someone help you when you read instructions, pamphlets, or other written material from your doctor or pharmacy?: Never Writes: Yes Employment Status: Retired Marine scientist Issues: NA Guardian/Conservator: None-according to MD pt is capable of making her own decisions while here   Abuse/Neglect Abuse/Neglect Assessment Can Be Completed: Yes Physical Abuse: Denies Verbal Abuse: Denies Sexual Abuse: Denies Exploitation of patient/patient's resources: Denies Self-Neglect: Denies  Patient response to: Social Isolation - How often do you feel lonely or isolated from those around you?: Never  Emotional Status Pt's affect, behavior and adjustment status: Pt is motivated to do well and recover and regain her independence while here. She hopes to be here a short time and has no concerns regarding needs at home Recent Psychosocial Issues: other health issues Psychiatric History: No history-issues is coping appropriately and is a MSW herself. According to MD can make own decisions while here Substance Abuse History: NA  Patient / Family Perceptions, Expectations & Goals Pt/Family understanding of illness & functional limitations: Pt and friend are present and are able to explain her health issues and how well she is doing now. Pt has no concerns regarding her medical issues Premorbid pt/family roles/activities: friend, retiree, church member, etc Anticipated changes in roles/activities/participation: resume Pt/family expectations/goals: Pt states: " I hope to be here a short time and be doing well, that's all."  [employer]: None Premorbid Home Care/DME Agencies: Other (Comment) (has lots of equipment from past surgeries) Transportation available at discharge: self and friends Is the patient able to respond to transportation needs?: Yes In the past 12 months, has lack of transportation  kept you from medical appointments or from getting medications?: No In the past 12 months, has lack of transportation kept you from meetings, work, or from getting things needed for daily living?: No  Discharge Planning Living Arrangements: Alone Support Systems: Manufacturing engineer, Psychologist, clinical community Type of Residence: Private residence Insurance Resources: [employer] Financial Resources: Social Security Financial Screen Referred: No Living Expenses: Own Money Management: Patient Does the patient have any problems obtaining your medications?: No Home Management: self Patient/Family Preliminary Plans: Return home with the help of friends aware being evaluated today and goals being set for stay here Care Coordinator Anticipated Follow Up Needs: HH/OP  Clinical Impression Pleasant motivated female who is doing well and hopes to be here a short time and get back home. She has many friends who will assist her if needed. Await therapy evaluations  Mardell Shade 07/07/2023, 9:55 AM

## 2023-07-07 NOTE — Progress Notes (Signed)
 Inpatient Rehabilitation Center Individual Statement of Services  Patient Name:  Jade Cardenas  Date:  07/07/2023  Welcome to the Inpatient Rehabilitation Center.  Our goal is to provide you with an individualized program based on your diagnosis and situation, designed to meet your specific needs.  With this comprehensive rehabilitation program, you will be expected to participate in at least 3 hours of rehabilitation therapies Monday-Friday, with modified therapy programming on the weekends.  Your rehabilitation program will include the following services:  Physical Therapy (PT), Occupational Therapy (OT), 24 hour per day rehabilitation nursing, Therapeutic Recreaction (TR), Care Coordinator, Rehabilitation Medicine, Nutrition Services, and Pharmacy Services  Weekly team conferences will be held on Wednesday to discuss your progress.  Your Inpatient Rehabilitation Care Coordinator will talk with you frequently to get your input and to update you on team discussions.  Team conferences with you and your family in attendance may also be held.  Expected length of stay: 12-14 days  Overall anticipated outcome: independent with device  Depending on your progress and recovery, your program may change. Your Inpatient Rehabilitation Care Coordinator will coordinate services and will keep you informed of any changes. Your Inpatient Rehabilitation Care Coordinator's name and contact numbers are listed  below.  The following services may also be recommended but are not provided by the Inpatient Rehabilitation Center:  Driving Evaluations Home Health Rehabiltiation Services Outpatient Rehabilitation Services    Arrangements will be made to provide these services after discharge if needed.  Arrangements include referral to agencies that provide these services.  Your insurance has been verified to be:  Medicare & AARP Your primary doctor is:  Daphine Eagle  Pertinent information will be shared with  your doctor and your insurance company.  Inpatient Rehabilitation Care Coordinator:  Adrianna Albee, Buzz Cass [phone] or Justine Oms  Information discussed with and copy given to patient by: Mardell Shade, 07/07/2023, 9:58 AM

## 2023-07-07 NOTE — Plan of Care (Signed)
  Problem: Consults Goal: RH GENERAL PATIENT EDUCATION Description: See Patient Education module for education specifics. Outcome: Progressing   Problem: RH BOWEL ELIMINATION Goal: RH STG MANAGE BOWEL WITH ASSISTANCE Description: STG Manage Bowel with mod I Assistance. Outcome: Progressing   Problem: RH BOWEL ELIMINATION Goal: RH STG MANAGE BOWEL W/MEDICATION W/ASSISTANCE Description: STG Manage Bowel with Medication with mod I Assistance. Outcome: Progressing   Problem: RH BLADDER ELIMINATION Goal: RH STG MANAGE BLADDER WITH ASSISTANCE Description: STG Manage Bladder With mod I Assistance Outcome: Progressing   Problem: RH SAFETY Goal: RH STG ADHERE TO SAFETY PRECAUTIONS W/ASSISTANCE/DEVICE Description: STG Adhere to Safety Precautions With cues Assistance/Device. Outcome: Progressing

## 2023-07-07 NOTE — Progress Notes (Signed)
 Occupational Therapy Session Note  Patient Details  Name: Jade Cardenas MRN: [id number] Date of Birth: 01/14/1951  {CHL IP REHAB OT TIME CALCULATIONS:[id number]}   Short Term Goals: Week 1:  OT Short Term Goal 1 (Week 1): Patient will amb with CGA to bathroom with DME/RW OT Short Term Goal 2 (Week 1): Patient will complete LB self care with min A with AE OT Short Term Goal 3 (Week 1): Patient will stand sinkside for 2 minutes for grooming with close S  Skilled Therapeutic Interventions/Progress Updates:    Patient agreeable to participate in OT session. Reports *** pain level.   Patient participated in skilled OT session focusing on ***. Therapist facilitated/assessed/developed/educated/integrated/elicited *** in order to improve/facilitate/promote    Therapy Documentation Precautions:  Precautions Precautions: Fall Recall of Precautions/Restrictions: Intact Restrictions Weight Bearing Restrictions Per Provider Order: No    Therapy/Group: Individual Therapy  Carollee Circle, OTR/L,CBIS  Supplemental OT - [facility] Secure Chat Preferred   07/07/2023, 10:34 PM

## 2023-07-07 NOTE — Progress Notes (Signed)
 Inpatient Rehabilitation  Patient information reviewed and entered into eRehab system by Feliberto Gottron, M.A., CCC-SLP, Rehab Quality Coordinator.  Information including medical coding, functional ability and quality indicators will be reviewed and updated through discharge.

## 2023-07-07 NOTE — Progress Notes (Signed)
 At bedside for PIV insertion. Assessed bilateral extremities with and without ultrasound. No suitable vessels found. Bilateral cephalic (upper arms) vessels non-compressible and over 2 cm in depth. Primary RN made aware. Recommend central access for any further intravenous needs.

## 2023-07-07 NOTE — Progress Notes (Signed)
 Patient a difficulty IV start per IV team--will change to 40 mg po X 1 followed by 20 mg daily for a week to help manage CHF/Pleural effusion. Will check follow up CXR in am.

## 2023-07-07 NOTE — Progress Notes (Signed)
 Left message with u/s tech, aware of orders. Oncoming nurse beck updated.

## 2023-07-08 ENCOUNTER — Inpatient Hospital Stay (HOSPITAL_COMMUNITY)

## 2023-07-08 ENCOUNTER — Other Ambulatory Visit (HOSPITAL_COMMUNITY): Payer: Self-pay

## 2023-07-08 DIAGNOSIS — R0781 Pleurodynia: Secondary | ICD-10-CM

## 2023-07-08 DIAGNOSIS — K5900 Constipation, unspecified: Secondary | ICD-10-CM

## 2023-07-08 LAB — BASIC METABOLIC PANEL WITH GFR
Anion gap: 9 (ref 5–15)
BUN: 19 mg/dL (ref 8–23)
CO2: 25 mmol/L (ref 22–32)
Calcium: 8.6 mg/dL — ABNORMAL LOW (ref 8.9–10.3)
Chloride: 103 mmol/L (ref 98–111)
Creatinine, Ser: 0.74 mg/dL (ref 0.44–1.00)
GFR, Estimated: >60 mL/min (ref >60)
Glucose, Bld: 138 mg/dL — ABNORMAL HIGH (ref 70–99)
Potassium: 3.8 mmol/L (ref 3.5–5.1)
Sodium: 137 mmol/L (ref 135–145)

## 2023-07-08 LAB — HEPATIC FUNCTION PANEL
ALT: 34 U/L (ref 0–44)
AST: 20 U/L (ref 15–41)
Albumin: 2.9 g/dL — ABNORMAL LOW (ref 3.5–5.0)
Alkaline Phosphatase: 70 U/L (ref 38–126)
Bilirubin, Direct: 0.3 mg/dL — ABNORMAL HIGH (ref 0.0–0.2)
Indirect Bilirubin: 1.3 mg/dL — ABNORMAL HIGH (ref 0.3–0.9)
Total Bilirubin: 1.6 mg/dL — ABNORMAL HIGH (ref 0.0–1.2)
Total Protein: 5.6 g/dL — ABNORMAL LOW (ref 6.5–8.1)

## 2023-07-08 LAB — CBC
HCT: 36.3 % (ref 36.0–46.0)
Hemoglobin: 12.2 g/dL (ref 12.0–15.0)
MCH: 30 pg (ref 26.0–34.0)
MCHC: 33.6 g/dL (ref 30.0–36.0)
MCV: 89.2 fL (ref 80.0–100.0)
Platelets: 502 10*3/uL — ABNORMAL HIGH (ref 150–400)
RBC: 4.07 MIL/uL (ref 3.87–5.11)
RDW: 16 % — ABNORMAL HIGH (ref 11.5–15.5)
WBC: 12.8 10*3/uL — ABNORMAL HIGH (ref 4.0–10.5)
nRBC: 0 % (ref 0.0–0.2)

## 2023-07-08 LAB — GLUCOSE, CAPILLARY
Glucose-Capillary: 134 mg/dL — ABNORMAL HIGH (ref 70–99)
Glucose-Capillary: 135 mg/dL — ABNORMAL HIGH (ref 70–99)
Glucose-Capillary: 134 mg/dL — ABNORMAL HIGH (ref 70–99)

## 2023-07-08 MED ORDER — POTASSIUM CHLORIDE CRYS ER 10 MEQ PO TBCR
10.0000 meq | EXTENDED_RELEASE_TABLET | Freq: Every day | ORAL | Status: AC
  Administered 2023-07-09 – 2023-07-14 (×6): 10 meq via ORAL
  Filled 2023-07-08 (×6): qty 1

## 2023-07-08 MED ORDER — LIDOCAINE 5 % EX PTCH: 2.0000 | MEDICATED_PATCH | CUTANEOUS | Status: DC

## 2023-07-08 MED ORDER — LIDOCAINE 5 % EX PTCH
2.0000 | MEDICATED_PATCH | CUTANEOUS | Status: DC
  Administered 2023-07-08 – 2023-07-13 (×3): 2 via TRANSDERMAL
  Filled 2023-07-08 (×5): qty 2

## 2023-07-08 MED ORDER — METHOCARBAMOL 500 MG PO TABS
500.0000 mg | ORAL_TABLET | Freq: Four times a day (QID) | ORAL | Status: DC
  Administered 2023-07-08 – 2023-07-14 (×8): 500 mg via ORAL
  Filled 2023-07-08 (×19): qty 1

## 2023-07-08 MED ORDER — METHOCARBAMOL 500 MG PO TABS: 500.0000 mg | ORAL_TABLET | Freq: Every day | ORAL | Status: DC

## 2023-07-08 MED ORDER — LOPERAMIDE HCL 2 MG PO CAPS
2.0000 mg | ORAL_CAPSULE | ORAL | Status: DC | PRN
  Administered 2023-07-08 – 2023-07-09 (×3): 2 mg via ORAL
  Filled 2023-07-08 (×3): qty 1

## 2023-07-08 NOTE — Progress Notes (Signed)
 Physical Therapy Session Note  Patient Details  Name: Jade Cardenas MRN: [id number] Date of Birth: Mar 30, 1950  Today's Date: 07/08/2023 PT Individual Time: 0800-0855 PT Individual Time Calculation (min): 55 min   Short Term Goals: Week 1:  PT Short Term Goal 1 (Week 1): STG=LTG due to LOS  Skilled Therapeutic Interventions/Progress Updates:      Pt supine in bed upon arrival. Pt agreeable to therapy. Pt reports difficulty sleeping last night 2/2 x ray, and blood work. Pt reports 7/10 abdominal/rib pain, with intermittent abdominal spasms, premedicated. Notified nurse of pt symptoms. Therapist provided rest breaks and repositioning as needed.   Pt performed supine to sit with use of bed features and supervision. Pt reports she has an adjustable bed at home.   Vitals assessed:   Sitting EOB pt reports dizziness 144/51 HR  Pt requesting to lay back down 2/2 dizziness and abdominal pains Lying 152/58 HR 87  Sit to supine with use of log roll technique with pt bracing pillwo for abdominal pain, verbal cues provided for technique.   Pt demos carry over of bracing pillow for management of abdominal pain when coughiing.   Pt performed the following bed level therex for B LE strengtheing/activity tolerance: verbal cues provided for breahting in through the nose and out through the mouth and for slow/controlled movements to reduce strain and valsalva.   2x5 SLR B  1x10 supine hip abduction B  1x10 heel slides  1x10 supine knee fall outs  1x20 ankle pumps   1x10 glute sets holding for 5 seconds  Attempted glute bridges however pt self discontinued 2/2 fatigue  Attempted sidelying activity however pt unable 2/2 pain  Pt supine in bed at end of session with all needs within reach and bed alarm on.       Therapy Documentation Precautions:  Precautions Precautions: Fall Recall of Precautions/Restrictions: Intact Restrictions Weight Bearing Restrictions Per Provider Order:  No  Therapy/Group: Individual Therapy  [facility] Jenney Modest, [place], DPT  07/08/2023, 7:54 AM

## 2023-07-08 NOTE — Progress Notes (Addendum)
 PROGRESS NOTE   Subjective/Complaints: Patient reports multiple bowel movements over the last 24 hours.  Since that she did not have good bowel movement for several days prior.  She asked about if she has to continue CBGs.  Patient has had some discomfort around her bilateral ribs that come and go.   ROS: Patient denies fever, new vision changes, dizziness, nausea, vomiting, diarrhea,  shortness of breath or chest pain, headache, or mood change. + chronic joint pain   Objective:   DG Chest 2 View Result Date: 07/08/2023 CLINICAL DATA:  Follow-up exam. EXAM: CHEST - 2 VIEW COMPARISON:  Chest radiograph dated 07/06/2023. FINDINGS: Small bilateral pleural effusions and right pericardial opacity similar to prior radiograph. No new consolidation. No pneumothorax. Stable cardiac silhouette. Degenerative changes spine. No acute osseous pathology. IMPRESSION: No interval change since the prior radiograph. Electronically Signed   By: Angus Bark M.D.   On: 07/08/2023 09:52   DG Chest 2 View Result Date: 07/06/2023 CLINICAL DATA:  [id number] Hemoptysis [id number] EXAM: CHEST - 2 VIEW COMPARISON:  07/03/2023 FINDINGS: Cardiac silhouette is prominent. There is pulmonary interstitial prominence with vascular congestion. No focal consolidation. No pneumothorax. Moderate bilateral pleural effusions. IMPRESSION: Findings suggest CHF. Electronically Signed   By: Sydell Eva M.D.   On: 07/06/2023 19:12   Recent Labs    07/07/23 0510 07/08/23 0634  WBC 13.3* 12.8*  HGB 11.6* 12.2  HCT 34.3* 36.3  PLT 417* 502*   Recent Labs    07/07/23 0510 07/08/23 0634  NA 138 137  K 3.7 3.8  CL 103 103  CO2 24 25  GLUCOSE 113* 138*  BUN 23 19  CREATININE 0.66 0.74  CALCIUM  8.5* 8.6*    Intake/Output Summary (Last 24 hours) at 07/08/2023 1638 Last data filed at 07/08/2023 1251 Gross per 24 hour  Intake 716 ml  Output --  Net 716 ml        Physical  Exam: Vital Signs Blood pressure (!) 152/61, pulse 87, temperature 97.7 F (36.5 C), temperature source Oral, resp. rate 16, height 5\' 5"  (1.651 m), weight 95.1 kg, last menstrual period 03/02/1984, SpO2 100%.  General: No apparent distress HEENT: Head is normocephalic, atraumatic, sclera anicteric, oral mucosa pink and moist Neck: Supple without JVD or lymphadenopathy Heart: Reg rate and rhythm. No murmurs rubs or gallops. + chest wall TTP b/l Chest: CTA bilaterally without wheezes, rales, or rhonchi; no distress Abdomen: Midline incision as well as multiple smaller incisions C/D/I with staples in place. Gastrostomy tube site with crusted blood--non tender.   Extremities: No clubbing, cyanosis, or edema. Pulses are 2+ Psych: Pt's affect is appropriate. Pt is cooperative Skin: Ecchymotic area LUE at site of prior IV?    Neuro:   Alert and oriented x 3. Normal insight and awareness. Intact Memory. Normal language and speech. Cranial nerve exam unremarkable. MMT: BUE 4-/5 proximally to 4/5 distally. BLE 3+/5 HF, 4/5 KE and 4+/5 ADF/PF. Sensory exam normal for light touch and pain in all 4 limbs. No limb ataxia or cerebellar signs. No abnormal tone appreciated.  .     Neuro exam stable 5/8  SENSORY: Normal to touch all 4 extremities  Coordination: Normal finger to nose and heel to shin, no tremor, no dysmetria   MSK: Chronic arthritic changes in hands/fingers.     Assessment/Plan: 1. Functional deficits which require 3+ hours per day of interdisciplinary therapy in a comprehensive inpatient rehab setting. Physiatrist is providing close team supervision and 24 hour management of active medical problems listed below. Physiatrist and rehab team continue to assess barriers to discharge/monitor patient progress toward functional and medical goals  Care Tool:  Bathing    Body parts bathed by patient: Right arm, Left arm, Chest, Front perineal area, Left upper leg, Right lower leg, Face    Body parts bathed by helper: Buttocks, Abdomen, Left lower leg, Right lower leg     Bathing assist Assist Level: Moderate Assistance - Patient 50 - 74%     Upper Body Dressing/Undressing Upper body dressing   What is the patient wearing?: Pull over shirt    Upper body assist Assist Level: Contact Guard/Touching assist    Lower Body Dressing/Undressing Lower body dressing      What is the patient wearing?: Underwear/pull up, Pants     Lower body assist Assist for lower body dressing: Maximal Assistance - Patient 25 - 49%     Toileting Toileting    Toileting assist Assist for toileting: Moderate Assistance - Patient 50 - 74%     Transfers Chair/bed transfer  Transfers assist     Chair/bed transfer assist level: Minimal Assistance - Patient > 75%     Locomotion Ambulation   Ambulation assist      Assist level: Minimal Assistance - Patient > 75% Assistive device: Walker-rolling Max distance: 73ft   Walk 10 feet activity   Assist  Walk 10 feet activity did not occur: Safety/medical concerns (dizziness, pain)        Walk 50 feet activity   Assist Walk 50 feet with 2 turns activity did not occur: Safety/medical concerns (dizziness, pain)         Walk 150 feet activity   Assist Walk 150 feet activity did not occur: Safety/medical concerns (dizziness, pain)         Walk 10 feet on uneven surface  activity   Assist Walk 10 feet on uneven surfaces activity did not occur: Safety/medical concerns (dizziness, pain)         Wheelchair     Assist Is the patient using a wheelchair?: Yes Type of Wheelchair: Manual    Wheelchair assist level: Dependent - Patient 0% Max wheelchair distance: 165ft    Wheelchair 50 feet with 2 turns activity    Assist        Assist Level: Dependent - Patient 0%   Wheelchair 150 feet activity     Assist      Assist Level: Dependent - Patient 0%   Blood pressure (!) 152/61, pulse 87,  temperature 97.7 F (36.5 C), temperature source Oral, resp. rate 16, height 5\' 5"  (1.651 m), weight 95.1 kg, last menstrual period 03/02/1984, SpO2 100%.   Medical Problem List and Plan: 1. Functional deficits secondary to debility after hiatal hernia repair/fundoplication/splenic artery repair and subsequent complications             -patient may shower             -ELOS/Goals: 10-12 days, supervision goals with PT and OT  - Continue CIR  2.  Antithrombotics: -DVT/anticoagulation:  Mechanical: Sequential compression devices, below knee Bilateral lower extremities due to hematoma/bleeding.              -  antiplatelet therapy: N/A 3. Pain Management: Oxycodone  prn severe pain and ultram  prn moderate pain             --decrease tylenol  to 650 mg qid.  4. Mood/Behavior/Sleep: LCSW to follow for evaluation and support.             --team to provide encouragement/ego support.              -antipsychotic agents: N/A  -5/7 patient would like to use as diphenhydramine  as needed for sleep, will DC trazodone for now.  She uses Tylenol  PM at home which is Tylenol  and diphenhydramine  5. Neuropsych/cognition: This patient is capable of making decisions on her own behalf. 6. Skin/Wound Care: Routine pressure relief measures. Monitor incisions for healing. No dressings needed 7. Fluids/Electrolytes/Nutrition: Monitor I/O. Change breeze to ensure to avoid GERD             --friends to bring in food/supplements from home             --will continue to monitor for hypoglycemic episode.   5/7 will DC CBG in am, continue lunch,dinner and HS  -DC CBG 8. Acute on chronic anemia: Has had rise in H/H from 6.1-->13.4 question accurate. Transfused with 4 units post op. --Will recheck in am  --was getting Iron infusions by Dr. Salomon Cree.  Her hematologist was notified about her admission  5/7 HGB stable 11.6 9. Mediastinal hematoma: Had episode of hematemesis vs hemoptysis last night (isolated) --Will check CXR for  follow up.  **CXR reviewed, ?CHF, has EF of 65%, chest clear on exam             -follow weights. Monitor clinically for now             -she's already a little dry by BUN             -re check labs in AM -5/7 will give lasix, 40mg  today and 20mg  daily, and potassium supplement. BUN/Cr stable-continue to monitor. Continue Delsym for cough prn -5/8 Will order echo- last one I see is from 2008? Will order  10. Chest wall pain: Likely due to CPR. Continue to encourage deep breathing/cough every hour  -5/8 lidocaine  patches ordered x2 11. Persistent leucocytosis: Monitor for fevers and signs of infection             --WBC has trended in 14-18 range.              -afebrile at present  -5/8 WBC down to 12.8 Episode of Hematemesis/Nocturnal cough: Continues to have chest pain 12. Constipation: Has not had BM since last week?Aaron Aas Moderate stool noted in colon on X rays.  and relays abdominal fullness.               --will add senna to colace              -does realize that her po intake needs to pick up as well  -5/8 multiple BMs over the past 24 hours, likely had buildup of stool over the past several days, continue current regimen for now 13. Urinary retention:  Has voided twice since foley came out yesterday. Needs to drink more             --will monitor voiding with bladder scans.  -5/8 continent PVR 0 today 14. Pre-renal azotemia: Encourage fluid/po intake.   5/8 BUN and creatinine stable at 19/0.74 15. RA/Psoriatic arthritis: Reports diffuse pain for past month (due to need to  hold meds pre-op)   --Has been cleared to resume Methotrexate  (today) and Simponi  (friend to bring in tomorrow)    LOS: 2 days A FACE TO FACE EVALUATION WAS PERFORMED  Lylia Sand 07/08/2023, 4:38 PM

## 2023-07-08 NOTE — Progress Notes (Signed)
 Occupational Therapy Session Note  Patient Details  Name: Jade Cardenas MRN: [id number] Date of Birth: 1950-04-20  Today's Date: 07/09/2023 OT Individual Time: 1050-1130 OT Individual Time Calculation (min): 40 min    Short Term Goals: Week 1:  OT Short Term Goal 1 (Week 1): Patient will amb with CGA to bathroom with DME/RW OT Short Term Goal 2 (Week 1): Patient will complete LB self care with min A with AE OT Short Term Goal 3 (Week 1): Patient will stand sinkside for 2 minutes for grooming with close S  Skilled Therapeutic Interventions/Progress Updates:    Patient agreeable to participate in OT session. Reports intermittent abdominal pain with no number provided. Monitored during session and adjusted tasks as appropriate for pt's tolerance level.   Patient participated in skilled OT session focusing on activity tolerance, core activation/strengthening, and functional transfers.   Bed mobility: Completed while transitioning from supine to seated EOB with SBA. HOB elevated and bed rail utilized.  Functional transfer: Completed step pivot transfer from bed to [facility] with CGA and use of RW. No VC for hand placement needed.   Pt completed exercises with focus on abdominal activation/core strength in order to increase functional performance during sit to stand transitions.  Verbal instructions and visual demonstration provided for proper form and technique.  Exercises completed:  - proximal shoulder strengthening while using small yellow therapy ball; chest press, shoulder flexion, circles (right/left), abdominal isometric hold pushing down into ball (3" hold), 10X, 1 set. Pt education provided on 360 breathing in order to focus on strengthening and engaging core muscles. Pt verbalized understanding.   Ambulatory transfer completed from [facility] to recliner once returned to room utilizing RW and CGA.   Therapy Documentation Precautions:  Precautions Precautions: Fall Recall of  Precautions/Restrictions: Intact Restrictions Weight Bearing Restrictions Per Provider Order: No   Therapy/Group: Individual Therapy  Carollee Circle, OTR/L,CBIS  Supplemental OT - [facility] and [facility] Secure Chat Preferred   07/09/2023, 7:53 AM

## 2023-07-08 NOTE — Progress Notes (Signed)
 Patient ID: Jade Cardenas, female   DOB: September 16, 1950, 73 y.o.   MRN: [id number] Met with the patient to review current medical situation, rehab process, team conference and plan of care. Discussed secondary risk management including soft diet; reports dairy products causing "Coughing" spells and she drank OJ which was too acidic. Reviewed soft diet options.  Reported not sleeping well, MD ordered benadryl  but she dozed off before asking for it and then started coughing.  RN made aware of need to administer prn sleep med and cough med at HS. Staples to abd not due for removal until next week. Continue to follow along to address educational needs to facilitate preparation for discharge. Naoma Bacca

## 2023-07-08 NOTE — Progress Notes (Signed)
 Physical Therapy Session Note  Patient Details  Name: Jade Cardenas MRN: [id number] Date of Birth: [birth year]  Today's Date: 07/08/2023 PT Missed Time: 60 Minutes Missed Time Reason: Patient unwilling to participate;Patient fatigue  Short Term Goals: Week 1:  PT Short Term Goal 1 (Week 1): STG=LTG due to LOS  Skilled Therapeutic Interventions/Progress Updates:      Pt supine in bed upon arrival. Pt reports increased fatigue from shower, sitting up in chair between sessions. Pt refusing to participate in therapy this session this afternoon despite encouragement. Pt missed 60 min. Pt will attempt to make up missed time as able.   Therapy Documentation Precautions:  Precautions Precautions: Fall Recall of Precautions/Restrictions: Intact Restrictions Weight Bearing Restrictions Per Provider Order: No General: PT Amount of Missed Time (min): 60 Minutes PT Missed Treatment Reason: Patient unwilling to participate;Patient fatigue   Therapy/Group: Individual Therapy  [facility] Jenney Modest, Franquez, DPT  07/08/2023, 3:48 PM

## 2023-07-09 ENCOUNTER — Other Ambulatory Visit (HOSPITAL_COMMUNITY): Payer: Self-pay

## 2023-07-09 ENCOUNTER — Inpatient Hospital Stay (HOSPITAL_BASED_OUTPATIENT_CLINIC_OR_DEPARTMENT_OTHER)

## 2023-07-09 ENCOUNTER — Inpatient Hospital Stay (HOSPITAL_COMMUNITY)

## 2023-07-09 DIAGNOSIS — I509 Heart failure, unspecified: Secondary | ICD-10-CM

## 2023-07-09 DIAGNOSIS — K219 Gastro-esophageal reflux disease without esophagitis: Secondary | ICD-10-CM

## 2023-07-09 DIAGNOSIS — R197 Diarrhea, unspecified: Secondary | ICD-10-CM

## 2023-07-09 DIAGNOSIS — M7989 Other specified soft tissue disorders: Secondary | ICD-10-CM

## 2023-07-09 LAB — ECHOCARDIOGRAM COMPLETE
Area-P 1/2: 4.15 cm2
Height: 65 in
S' Lateral: 2.7 cm
Weight: 3354.52 [oz_av]

## 2023-07-09 LAB — HEPATIC FUNCTION PANEL
ALT: 30 U/L (ref 0–44)
AST: 21 U/L (ref 15–41)
Albumin: 3 g/dL — ABNORMAL LOW (ref 3.5–5.0)
Alkaline Phosphatase: 71 U/L (ref 38–126)
Bilirubin, Direct: 0.2 mg/dL (ref 0.0–0.2)
Indirect Bilirubin: 1.2 mg/dL — ABNORMAL HIGH (ref 0.3–0.9)
Total Bilirubin: 1.4 mg/dL — ABNORMAL HIGH (ref 0.0–1.2)
Total Protein: 6.1 g/dL — ABNORMAL LOW (ref 6.5–8.1)

## 2023-07-09 MED ORDER — HEPARIN (PORCINE) 25000 UT/250ML-% IV SOLN
1200.0000 [IU]/h | INTRAVENOUS | Status: DC
  Administered 2023-07-09: 1200 [IU]/h via INTRAVENOUS
  Filled 2023-07-09: qty 250

## 2023-07-09 MED ORDER — KATE FARMS STANDARD 1.4 PO LIQD
325.0000 mL | Freq: Three times a day (TID) | ORAL | Status: DC
  Administered 2023-07-09 – 2023-07-13 (×6): 325 mL via ORAL
  Filled 2023-07-09 (×17): qty 325

## 2023-07-09 MED ORDER — PANTOPRAZOLE SODIUM 40 MG PO TBEC
40.0000 mg | DELAYED_RELEASE_TABLET | Freq: Two times a day (BID) | ORAL | Status: DC
  Administered 2023-07-09 – 2023-07-10 (×2): 40 mg via ORAL
  Filled 2023-07-09 (×2): qty 1

## 2023-07-09 NOTE — Progress Notes (Signed)
 Discussed doppler results with Dr. Dorrie Gaudier. He felt that we could start her on [facility] as two weeks out. To start IV heparin  without bolus and if stable for 2-3 days transition to full dose tx. Labs ordered to monitor H/H over next three days.

## 2023-07-09 NOTE — Progress Notes (Signed)
 Occupational Therapy Session Note  Patient Details  Name: Jade Cardenas MRN: [id number] Date of Birth: 10-25-1950  {CHL IP REHAB OT TIME CALCULATIONS:[id number]}   Short Term Goals: Week 1:  OT Short Term Goal 1 (Week 1): Patient will amb with CGA to bathroom with DME/RW OT Short Term Goal 2 (Week 1): Patient will complete LB self care with min A with AE OT Short Term Goal 3 (Week 1): Patient will stand sinkside for 2 minutes for grooming with close S  Skilled Therapeutic Interventions/Progress Updates:    Patient agreeable to participate in OT session. Reports *** pain level.   Patient participated in skilled OT session focusing on ***. Therapist facilitated/assessed/developed/educated/integrated/elicited *** in order to improve/facilitate/promote    Therapy Documentation Precautions:  Precautions Precautions: Fall Recall of Precautions/Restrictions: Intact Restrictions Weight Bearing Restrictions Per Provider Order: No  Therapy/Group: Individual Therapy  Carollee Circle, OTR/L,CBIS  Supplemental OT - [facility] and [facility] Secure Chat Preferred   07/09/2023, 10:18 PM

## 2023-07-09 NOTE — Progress Notes (Addendum)
 PHARMACY - ANTICOAGULATION CONSULT NOTE  Pharmacy Consult for heparin  Indication: LLE DVT  Allergies  Allergen Reactions   Erythromycin Hives, Swelling and Other (See Comments)    All Mycins; throat swells shut and she gets "really hot"   Iodinated Contrast Media Shortness Of Breath and Swelling    CT contrast; throat swelling, difficulty breathing.  In [place]  "many years ago."   Ace Inhibitors Cough   Indomethacin Other (See Comments)   Keflex [Cephalexin] Hives   Macrobid  [Nitrofurantoin ] Other (See Comments)    dizziness    Patient Measurements: Height: 5\' 5"  (165.1 cm) Weight: 95.1 kg (209 lb 10.5 oz) IBW/kg (Calculated) : 57 HEPARIN  DW (KG): 78.9  Vital Signs: Temp: 98.2 F (36.8 C) (05/09 1410) Temp Source: Oral (05/09 1410) BP: 131/58 (05/09 1410) Pulse Rate: 86 (05/09 1410)  Labs: Recent Labs    07/07/23 0510 07/08/23 0634  HGB 11.6* 12.2  HCT 34.3* 36.3  PLT 417* 502*  CREATININE 0.66 0.74    Estimated Creatinine Clearance: 73.5 mL/min (by C-G formula based on SCr of 0.74 mg/dL).   Medical History: Past Medical History:  Diagnosis Date   Anemia    iron def   Arthritis    rheumatoid, psoriatic, osteoarthritis    Bone spur    RIGHT FOOT    Broken toe    Bruises easily    Cellulitis    Chronic back pain    COLONIC POLYPS, HX OF 02/09/2007   Cough, persistent 01/21/2012   poss from acei    DES exposure in utero, unknown    Femur fracture, left (HCC)    Fibromyalgia    doesn't require meds   GASTRIC ULCER, ACUTE, HEMORRHAGE, HX OF 02/09/2007   GERD (gastroesophageal reflux disease)    takes Nexium  daily   Goiter    H/O hiatal hernia    H/O: hysterectomy    when 73 yrs old   Headache    Occasional migraines with increased stress   Hx of seasonal allergies    takes Claritin  daily   HYPERLIPIDEMIA 12/29/2006   takes Lovastatin  nightly   HYPERTENSION 12/29/2006   takes Enalapril  daily   IBS 04/28/2007   Insomnia    related to  pain;takes Flexeril  and Tylenol  PM nightly   Joint pain    Joint swelling    Neuropathy    small cell   Osteopenia    Palpitations 02/09/2007   PONV (postoperative nausea and vomiting)    NAUSEA   REDUCTION MAMMOPLASTY, HX OF 02/09/2007   Right bundle branch block 02/09/2007   S/P lumbar fusion 6 13 09/03/2011   L4 L5  posterior    SYNCOPE 12/07/2007   Tendonitis    RLE    UNS ADVRS EFF OTH RX MEDICINAL\T\BIOLOGICAL SBSTNC 02/09/2007     Assessment: 73 yo W with Cameron's ulcers, chronic anemia, and large hiatal hernia s/p repair with PEG placement 06/29/23 found to have acute and age indeterminate LLE DVTs. Patient had significant bleeding during procedure s/p splenic artery ligation. Patient's hgb was 6.1 on 5/4 s/p 2 units RBC. Patient has been off all anticoagulation since 5/1. Plan to start heparin  and monitor Hgb for a few days before switch to DOAC.   Goal of Therapy:  Heparin  level 0.3-0.7 units/ml Monitor platelets by anticoagulation protocol: Yes   Plan:  Heparin  1200 units/hr, no bolus per consult Monitor daily heparin  level, CBC, signs/symptoms of bleeding  F/u switch to DOAC if hgb stable  Dorene Gang, PharmD, BCPS, [facility] Clinical  Pharmacist  Please check AMION for all [facility] Pharmacy phone numbers After 10:00 PM, call Main Pharmacy [phone]

## 2023-07-09 NOTE — Progress Notes (Signed)
 VASCULAR LAB    Bilateral lower extremity venous duplex has been performed.  See CV proc for preliminary results.  Relayed results to [place] , LPN and Dr. Rayleen Cal via secure chat  Carleene Chase, RVT 07/09/2023, 4:51 PM

## 2023-07-09 NOTE — IPOC Note (Signed)
 Overall Plan of Care [facility]) Patient Details Name: Jade Cardenas MRN: [id number] DOB: 1950-10-15  Admitting Diagnosis: Debility  Hospital Problems: Principal Problem:   Debility     Functional Problem List: Nursing Pain, Safety, Bowel, Bladder, Endurance, Medication Management  PT Balance, Behavior, Edema, Endurance, Nutrition, Pain, Skin Integrity  OT Balance, Endurance, Motor, Pain, Skin Integrity  SLP    TR         Basic ADL's: OT Grooming, Bathing, Dressing, Toileting     Advanced  ADL's: OT Simple Meal Preparation, Laundry, Light Housekeeping     Transfers: PT Bed Mobility, Bed to Chair, Car, Occupational psychologist, Research scientist (life sciences): PT Ambulation, Psychologist, prison and probation services, Stairs     Additional Impairments: OT Other (comment) (generalize muscle weakness)  SLP        TR      Anticipated Outcomes Item Anticipated Outcome  Self Feeding indepedent  Swallowing      Basic self-care  mod I  Toileting  mod I   Bathroom Transfers mod I  Bowel/Bladder  manage bowel and bladder w mod I assist  Transfers  Mod I with LRAD  Locomotion  Mod I with LRAD  Communication     Cognition     Pain  manage pain < 4  with prns  Safety/Judgment  manage safety w cues   Therapy Plan: PT Intensity: Minimum of 1-2 x/day ,45 to 90 minutes PT Frequency: 5 out of 7 days PT Duration Estimated Length of Stay: 7-10 days OT Intensity: Minimum of 1-2 x/day, 45 to 90 minutes OT Frequency: 5 out of 7 days OT Duration/Estimated Length of Stay: 12-14 days     Team Interventions: Nursing Interventions Patient/Family Education, Pain Management, Medication Management, Bladder Management, Bowel Management, Discharge Planning, Disease Management/Prevention  PT interventions Ambulation/gait training, Discharge planning, Functional mobility training, Psychosocial support, Therapeutic Activities, Visual/perceptual remediation/compensation, Balance/vestibular training, Disease  management/prevention, Neuromuscular re-education, Skin care/wound management, Therapeutic Exercise, Wheelchair propulsion/positioning, DME/adaptive equipment instruction, Pain management, Splinting/orthotics, UE/LE Strength taining/ROM, Firefighter, Equities trader education, Museum/gallery curator, UE/LE Coordination activities  OT Interventions Warden/ranger, Firefighter, Discharge planning, Disease mangement/prevention, Fish farm manager, Functional mobility training, Pain management, Patient/family education, Self Care/advanced ADL retraining, Skin care/wound managment, Therapeutic Activities, Therapeutic Exercise, UE/LE Strength taining/ROM, Wheelchair propulsion/positioning  SLP Interventions    TR Interventions    SW/CM Interventions Discharge Planning, Psychosocial Support, Patient/Family Education   Barriers to Discharge MD  Medical stability  Nursing Lack of/limited family support 1 level/ 1 ste no rails, solo has DME  PT Decreased caregiver support, Home environment access/layout, Other (comments) pain, dizziness, 1 STE, lives alone but has friends who can assist as needed  OT Inaccessible home environment, Decreased caregiver support, Wound Care lives alone with 1 STE, has g tube and abdominal incision  SLP      SW       Team Discharge Planning: Destination: PT-Home ,OT- Home , SLP-  Projected Follow-up: PT-Outpatient PT, OT-  Home health OT, SLP-  Projected Equipment Needs: PT-To be determined, OT- To be determined, SLP-  Equipment Details: PT-has RW, rollator, and SPC, OT-has rollator, cane, BSC, shower seat, grab bars, reacher Patient/family involved in discharge planning: PT- Patient,  OT-Patient, SLP-   MD ELOS: 10-12 Medical Rehab Prognosis:  Excellent Assessment: The patient has been admitted for CIR therapies with the diagnosis of debility after hiatal hernia repair/fundoplication/splenic artery repair and subsequent  complications . The team will be addressing functional mobility, strength, stamina, balance, safety,  adaptive techniques and equipment, self-care, bowel and bladder mgt, patient and caregiver education. Goals have been set at mod I. Anticipated discharge destination is home.        See Team Conference Notes for weekly updates to the plan of care

## 2023-07-09 NOTE — Progress Notes (Addendum)
 PROGRESS NOTE   Subjective/Complaints: Continues to have multiple bowel movements.  Patient has had frequent BMs, she thinks that she may be intolerant to lactulose.  LBM was 1 AM this morning.  Reports some heartburn, takes Protonix  twice daily before meals.   ROS: Patient denies fever, new vision changes, dizziness, nausea, vomiting,  shortness of breath or chest pain, headache, or mood change. + chronic joint pain  + frequent BMs  Objective:   DG Chest 2 View Result Date: 07/08/2023 CLINICAL DATA:  Follow-up exam. EXAM: CHEST - 2 VIEW COMPARISON:  Chest radiograph dated 07/06/2023. FINDINGS: Small bilateral pleural effusions and right pericardial opacity similar to prior radiograph. No new consolidation. No pneumothorax. Stable cardiac silhouette. Degenerative changes spine. No acute osseous pathology. IMPRESSION: No interval change since the prior radiograph. Electronically Signed   By: Angus Bark M.D.   On: 07/08/2023 09:52   Recent Labs    07/07/23 0510 07/08/23 0634  WBC 13.3* 12.8*  HGB 11.6* 12.2  HCT 34.3* 36.3  PLT 417* 502*   Recent Labs    07/07/23 0510 07/08/23 0634  NA 138 137  K 3.7 3.8  CL 103 103  CO2 24 25  GLUCOSE 113* 138*  BUN 23 19  CREATININE 0.66 0.74  CALCIUM  8.5* 8.6*    Intake/Output Summary (Last 24 hours) at 07/09/2023 1457 Last data filed at 07/09/2023 0813 Gross per 24 hour  Intake 218 ml  Output --  Net 218 ml        Physical Exam: Vital Signs Blood pressure (!) 131/58, pulse 86, temperature 98.2 F (36.8 C), temperature source Oral, resp. rate 18, height 5\' 5"  (1.651 m), weight 95.1 kg, last menstrual period 03/02/1984, SpO2 97%.  General: No apparent distress HEENT: Head is normocephalic, atraumatic, sclera anicteric, oral mucosa pink and moist Neck: Supple without JVD or lymphadenopathy Heart: Reg rate and rhythm. No murmurs rubs or gallops. + chest wall TTP  b/l Chest: CTA bilaterally without wheezes, rales, or rhonchi; no distress Abdomen: Midline incision as well as multiple smaller incisions C/D/I with staples in place. Gastrostomy tube site with crusted blood--non tender.   Extremities: No clubbing, cyanosis, or edema. Pulses are 2+ Psych: Pt's affect is appropriate. Pt is cooperative Skin: Ecchymotic area LUE at site of prior IV?    Neuro:   Alert and oriented x 3. Normal insight and awareness. Intact Memory. Normal language and speech. Cranial nerve exam unremarkable. MMT: BUE 4-/5 proximally to 4/5 distally. BLE 3+/5 HF, 4/5 KE and 4+/5 ADF/PF. Sensory exam normal for light touch and pain in all 4 limbs. No limb ataxia or cerebellar signs. No abnormal tone appreciated.  .     Neuro exam stable 5/9  SENSORY: Normal to touch all 4 extremities  Coordination: Normal finger to nose and heel to shin, no tremor, no dysmetria   MSK: Chronic arthritic changes in hands/fingers.     Assessment/Plan: 1. Functional deficits which require 3+ hours per day of interdisciplinary therapy in a comprehensive inpatient rehab setting. Physiatrist is providing close team supervision and 24 hour management of active medical problems listed below. Physiatrist and rehab team continue to assess barriers to discharge/monitor patient  progress toward functional and medical goals  Care Tool:  Bathing    Body parts bathed by patient: Right arm, Left arm, Chest, Front perineal area, Left upper leg, Right lower leg, Face   Body parts bathed by helper: Buttocks, Abdomen, Left lower leg, Right lower leg     Bathing assist Assist Level: Moderate Assistance - Patient 50 - 74%     Upper Body Dressing/Undressing Upper body dressing   What is the patient wearing?: Pull over shirt    Upper body assist Assist Level: Contact Guard/Touching assist    Lower Body Dressing/Undressing Lower body dressing      What is the patient wearing?: Underwear/pull up,  Pants     Lower body assist Assist for lower body dressing: Maximal Assistance - Patient 25 - 49%     Toileting Toileting    Toileting assist Assist for toileting: Moderate Assistance - Patient 50 - 74%     Transfers Chair/bed transfer  Transfers assist     Chair/bed transfer assist level: Minimal Assistance - Patient > 75%     Locomotion Ambulation   Ambulation assist      Assist level: Minimal Assistance - Patient > 75% Assistive device: Walker-rolling Max distance: 25ft   Walk 10 feet activity   Assist  Walk 10 feet activity did not occur: Safety/medical concerns (dizziness, pain)        Walk 50 feet activity   Assist Walk 50 feet with 2 turns activity did not occur: Safety/medical concerns (dizziness, pain)         Walk 150 feet activity   Assist Walk 150 feet activity did not occur: Safety/medical concerns (dizziness, pain)         Walk 10 feet on uneven surface  activity   Assist Walk 10 feet on uneven surfaces activity did not occur: Safety/medical concerns (dizziness, pain)         Wheelchair     Assist Is the patient using a wheelchair?: Yes Type of Wheelchair: Manual    Wheelchair assist level: Dependent - Patient 0% Max wheelchair distance: 155ft    Wheelchair 50 feet with 2 turns activity    Assist        Assist Level: Dependent - Patient 0%   Wheelchair 150 feet activity     Assist      Assist Level: Dependent - Patient 0%   Blood pressure (!) 131/58, pulse 86, temperature 98.2 F (36.8 C), temperature source Oral, resp. rate 18, height 5\' 5"  (1.651 m), weight 95.1 kg, last menstrual period 03/02/1984, SpO2 97%.   Medical Problem List and Plan: 1. Functional deficits secondary to debility after hiatal hernia repair/fundoplication/splenic artery repair and subsequent complications             -patient may shower             -ELOS/Goals: 10-12 days, supervision goals with PT and OT  - Continue  CIR  -IPOC note completed  2.  Antithrombotics: -DVT/anticoagulation:  Mechanical: Sequential compression devices, below knee Bilateral lower extremities due to hematoma/bleeding.              -antiplatelet therapy: N/A 3. Pain Management: Oxycodone  prn severe pain and ultram  prn moderate pain             --decrease tylenol  to 650 mg qid.   -Continue robaxin3 4. Mood/Behavior/Sleep: LCSW to follow for evaluation and support.             --team to provide encouragement/ego  support.              -antipsychotic agents: N/A  -5/7 patient would like to use as diphenhydramine  as needed for sleep, will DC trazodone  for now.  She uses Tylenol  PM at home which is Tylenol  and diphenhydramine  5. Neuropsych/cognition: This patient is capable of making decisions on her own behalf. 6. Skin/Wound Care: Routine pressure relief measures. Monitor incisions for healing. No dressings needed 7. Fluids/Electrolytes/Nutrition: Monitor I/O. Change breeze to ensure to avoid GERD             --friends to bring in food/supplements from home             --will continue to monitor for hypoglycemic episode.   5/7 will DC CBG in am, continue lunch,dinner and HS  -DC CBG  -5/9 Adjust protonix  to before meals as more effective taken in this manner 8. Acute on chronic anemia: Has had rise in H/H from 6.1-->13.4 question accurate. Transfused with 4 units post op. --Will recheck in am  --was getting Iron infusions by Dr. Salomon Cree.  Her hematologist was notified about her admission  5/7 HGB stable 11.6 9. Mediastinal hematoma: Had episode of hematemesis vs hemoptysis last night (isolated) --Will check CXR for follow up.  **CXR reviewed, ?CHF, has EF of 65%, chest clear on exam             -follow weights. Monitor clinically for now             -she's already a little dry by BUN             -re check labs in AM -5/7 will give lasix , 40mg  today and 20mg  daily, and potassium supplement. BUN/Cr stable-continue to monitor.  Continue Delsym  for cough prn -5/8 Will order echo- last one I see is from 2008?   5/9 check echocardiogram today, discussed with patient 10. Chest wall pain: Likely due to CPR. Continue to encourage deep breathing/cough every hour  -5/8 lidocaine  patches ordered x2- providing mild benefit  11. Persistent leucocytosis: Monitor for fevers and signs of infection             --WBC has trended in 14-18 range.              -afebrile at present  -5/8 WBC down to 12.8  Recheck monday Episode of Hematemesis/Nocturnal cough: Continues to have chest pain 12. Constipation: Has not had BM since last week?Aaron Aas Moderate stool noted in colon on X rays.  and relays abdominal fullness.               --will add senna to colace              -does realize that her po intake needs to pick up as well  -5/8 multiple BMs over the past 24 hours, likely had buildup of stool over the past several days, continue current regimen for now  5/9 DC senokot, lactaid 13. Urinary retention:  Has voided twice since foley came out yesterday. Needs to drink more             --will monitor voiding with bladder scans.  -5/8 continent PVR 0 today  -5/9 remains continent, monitor  14. Pre-renal azotemia: Encourage fluid/po intake.   5/8 BUN and creatinine stable at 19/0.74 15. RA/Psoriatic arthritis: Reports diffuse pain for past month (due to need to hold meds pre-op)   --Has been cleared to resume Methotrexate  (today) and Simponi  (friend to bring in tomorrow)  LOS: 3 days A FACE TO FACE EVALUATION WAS PERFORMED  Jade Cardenas 07/09/2023, 2:57 PM

## 2023-07-09 NOTE — Progress Notes (Signed)
 Occupational Therapy Session Note  Patient Details  Name: Jade Cardenas MRN: [id number] Date of Birth: 06/11/1950  Today's Date: 07/09/2023 OT Individual Time: 1884-1660 OT Individual Time Calculation (min): 70 min    Short Term Goals: Week 1:  OT Short Term Goal 1 (Week 1): Patient will amb with CGA to bathroom with DME/RW OT Short Term Goal 2 (Week 1): Patient will complete LB self care with min A with AE OT Short Term Goal 3 (Week 1): Patient will stand sinkside for 2 minutes for grooming with close S   Skilled Therapeutic Interventions/Progress Updates:    Pt bed level at time of session, no pain at rest but c/o some reflux/burning discomfort with movement - planning to ask nursing/MD about meds for reflux. Still able to participate. Pt with diarrhea and very fatigued but able to participate with encouargement. Supine > sit with Supervision and stand pivot to [facility] (declined walking to bathroom 2/2 fatigue) with SBA with RW, able to doff/don clothing back up but unable to wipe posterior or anteriorly despite cues for compensatory techniques and strategies. Short distance mobility in room with RW CGA, set up in wheelchair for sink level hygiene, face wash, brush teeth, etc. Friend present throughout session too who was a Char[employer] fundraiser, pt verbalized OK with this. Pt transported throughout unit to improve familiarity with gym, rooms, and promote time out of room. Back in room, declining to sit up 2/2 neck discomfort. Stand pivot wheelchair > bed SBA, sit > supine with CGA and using gait belt as a leg lifter. Set up bed level alarm on call bell in reach.   Therapy Documentation Precautions:  Precautions Precautions: Fall Recall of Precautions/Restrictions: Intact Restrictions Weight Bearing Restrictions Per Provider Order: No    Therapy/Group: Individual Therapy  Doroteo Gasmen 07/09/2023, 9:33 AM

## 2023-07-09 NOTE — Progress Notes (Signed)
 Physical Therapy Session Note  Patient Details  Name: Jade Cardenas MRN: [id number] Date of Birth: Jul 16, 1950  Today's Date: 07/09/2023 PT Individual Time: 1131-1200 PT Individual Time Calculation (min): 29 min   Short Term Goals: Week 1:  PT Short Term Goal 1 (Week 1): STG=LTG due to LOS  Skilled Therapeutic Interventions/Progress Updates: Pt presents sitting in recliner w/ LES elevated and agreeable to therapy for LE there ex.  Pt performed calf raises, LAQ, hip flex, abd/add 3 x 15.  Pt very talkative and frustrated w/ situation, but pleasant.  Pt required rest breaks between 2/2 fatigue and weakness 2/2 therapy sessions this AM of which I am the 3rd.  Pt remained sitting in recliner w/ LES elevated and all needs in reach.     Therapy Documentation Precautions:  Precautions Precautions: Fall Recall of Precautions/Restrictions: Intact Restrictions Weight Bearing Restrictions Per Provider Order: No General:   Vital Signs:   Pain:9/10 just received pain meds for abd and ribs. Pain Assessment Pain Scale: 0-10 Pain Score: 8  Pain Location: Generalized Pain Intervention(s): Medication (See eMAR)     Therapy/Group: Individual Therapy  Norlene Lanes P Leanette Eutsler 07/09/2023, 12:01 PM

## 2023-07-09 NOTE — Progress Notes (Addendum)
 Physical Therapy Session Note  Patient Details  Name: RAEVYNN MULDOWNEY MRN: [id number] Date of Birth: 1950-04-21  Today's Date: 07/09/2023  Short Term Goals: Week 1:  PT Short Term Goal 1 (Week 1): STG=LTG due to LOS  Pt missed 45 min of skilled therapy due to fatigue/pain. Pt stated difficulty eating due to chest pain (stated either from surgery or heart burn). PTA communicated with nsg to order lactose-free protein shake to see if this will increase pt's intake - pt reported issues with dairy since admission). PTA encouraged pt to participate. Pt politely denied. PTA provided graham crackers to see if pt can tolerate eating that as pt did not eat lunch. PTA communicated to nsg about pt report of chest pain. Will re-attempt as schedule and pt availability permits.  Therapy Documentation Precautions:  Precautions Precautions: Fall Recall of Precautions/Restrictions: Intact Restrictions Weight Bearing Restrictions Per Provider Order: No  Therapy/Group: Individual Therapy  Rasul Decola PTA 07/09/2023, 3:17 PM

## 2023-07-10 MED ORDER — ENOXAPARIN SODIUM 100 MG/ML IJ SOSY
90.0000 mg | PREFILLED_SYRINGE | Freq: Two times a day (BID) | INTRAMUSCULAR | Status: DC
  Administered 2023-07-10 – 2023-07-14 (×9): 90 mg via SUBCUTANEOUS
  Filled 2023-07-10 (×10): qty 0.9

## 2023-07-10 MED ORDER — PANTOPRAZOLE SODIUM 40 MG PO TBEC
40.0000 mg | DELAYED_RELEASE_TABLET | Freq: Two times a day (BID) | ORAL | Status: DC
  Administered 2023-07-10 – 2023-07-15 (×10): 40 mg via ORAL
  Filled 2023-07-10 (×10): qty 1

## 2023-07-10 NOTE — Progress Notes (Signed)
 PROGRESS NOTE   Subjective/Complaints:  Pt reports is scaredof abdominal injections Also notes has become lactose intolerant since surgery/CPR Not doing great- rough going- LBBM- diarrhea x 3-4x yesterday due to the protein shakes and dairy.   Also has put herself on liquid diet. - because food even soup with food in it gets stuck.   Said still having pain due to CPR/rib pain.  Cough at night- and peeing more since put on Lasix .   Also notes has the new blod clots- was put on heparin , however got call from Pharmacy that at noon, hadn't been able to get 2am labs due to lab shortages today- and asked pt be moved to Tx dose lovenox if possible- called General surgery- spoke ti Dr Aniceto Barley- since has been 2 week ssince Mediastinal hematoma, appears OK to do so, but will monitor daily H/H and clinically.   Pt doesn't want injections in abdomen- let nursing know can do triceps or thighs.     ROS:   Pt denies SOB, abd pain, CP, N/V/C/D, and vision changes  + chronic joint pain  + frequent BMs  Objective:   VAS US  LOWER EXTREMITY VENOUS (DVT) Result Date: 07/10/2023  Lower Venous DVT Study Patient Name:  CROSBY GACHUPIN  Date of Exam:   07/09/2023 Medical Rec #: [id number]          Accession #:    [id number] Date of Birth: 1950/06/21          Patient Gender: F Patient Age:   73 years Exam Location:  [facility] Procedure:      VAS US  LOWER EXTREMITY VENOUS (DVT) Referring Phys: PAMELA LOVE --------------------------------------------------------------------------------  Other Indications: History of venous reflux disease, diagnosed 10/01/2016. Risk Factors: Immobility Status post CPR X 2 during GI surgery. Comparison Study: No prior LEV on file Performing Technologist: Carleene Chase RVS  Examination Guidelines: A complete evaluation includes B-mode imaging, spectral Doppler, color Doppler, and power Doppler as needed of all accessible  portions of each vessel. Bilateral testing is considered an integral part of a complete examination. Limited examinations for reoccurring indications may be performed as noted. The reflux portion of the exam is performed with the patient in reverse Trendelenburg.  +---------+---------------+---------+-----------+----------+--------------+ RIGHT    CompressibilityPhasicitySpontaneityPropertiesThrombus Aging +---------+---------------+---------+-----------+----------+--------------+ CFV      Full           Yes      Yes                                 +---------+---------------+---------+-----------+----------+--------------+ SFJ      Full                                                        +---------+---------------+---------+-----------+----------+--------------+ FV Prox  Full                                                        +---------+---------------+---------+-----------+----------+--------------+  FV Mid   Full                                                        +---------+---------------+---------+-----------+----------+--------------+ FV DistalFull                                                        +---------+---------------+---------+-----------+----------+--------------+ PFV      Full                                                        +---------+---------------+---------+-----------+----------+--------------+ POP      Full           Yes      Yes                                 +---------+---------------+---------+-----------+----------+--------------+ PTV      Partial                                      Acute          +---------+---------------+---------+-----------+----------+--------------+ PERO     Partial                                      Acute          +---------+---------------+---------+-----------+----------+--------------+   +---------+---------------+---------+-----------+----------+-----------------+ LEFT      CompressibilityPhasicitySpontaneityPropertiesThrombus Aging    +---------+---------------+---------+-----------+----------+-----------------+ CFV      Full                                                           +---------+---------------+---------+-----------+----------+-----------------+ SFJ      Full                                                           +---------+---------------+---------+-----------+----------+-----------------+ FV Prox  Full                                                           +---------+---------------+---------+-----------+----------+-----------------+ FV Mid   Full                                                           +---------+---------------+---------+-----------+----------+-----------------+  FV DistalPartial        No       No                   Age Indeterminate +---------+---------------+---------+-----------+----------+-----------------+ PFV      Full                                                           +---------+---------------+---------+-----------+----------+-----------------+ POP      Partial        No       No                   Age Indeterminate +---------+---------------+---------+-----------+----------+-----------------+ PTV      Full                                                           +---------+---------------+---------+-----------+----------+-----------------+ PERO     None                                         Acute             +---------+---------------+---------+-----------+----------+-----------------+     Summary: RIGHT: - Findings consistent with acute deep vein thrombosis involving the right posterior tibial veins, and right peroneal veins.  - No cystic structure found in the popliteal fossa.  LEFT: - Findings consistent with acute deep vein thrombosis involving the left peroneal veins.  - Findings consistent with age indeterminate deep vein thrombosis involving the  distal left femoral vein, and left popliteal vein.  - No cystic structure found in the popliteal fossa.  *See table(s) above for measurements and observations. Electronically signed by Angela Kell MD on 07/10/2023 at 9:53:25 AM.    Final    ECHOCARDIOGRAM COMPLETE Result Date: 07/09/2023    ECHOCARDIOGRAM REPORT   Patient Name:   JAEANNA YEATON Date of Exam: 07/09/2023 Medical Rec #:  [id number]         Height:       65.0 in Accession #:    [id number]        Weight:       209.7 lb Date of Birth:  August 28, 1950         BSA:          2.018 m Patient Age:    73 years          BP:           131/58 mmHg Patient Gender: F                 HR:           83 bpm. Exam Location:  Inpatient Procedure: 2D Echo, Cardiac Doppler and Color Doppler (Both Spectral and Color            Flow Doppler were utilized during procedure). Indications:    Congestive Heart Failure  History:        Patient has no prior history of Echocardiogram examinations.  Sonographer:    Janette Medley Referring Phys: [id number] HYQM  SHTRIDELMAN IMPRESSIONS  1. Left ventricular ejection fraction, by estimation, is 60 to 65%. The left ventricle has normal function. The left ventricle has no regional wall motion abnormalities. Left ventricular diastolic parameters are consistent with Grade I diastolic dysfunction (impaired relaxation).  2. Right ventricular systolic function is normal. The right ventricular size is normal.  3. The mitral valve is abnormal. Trivial mitral valve regurgitation. No evidence of mitral stenosis.  4. The aortic valve is tricuspid. There is mild calcification of the aortic valve. Aortic valve regurgitation is not visualized. Aortic valve sclerosis is present, with no evidence of aortic valve stenosis.  5. The inferior vena cava is normal in size with greater than 50% respiratory variability, suggesting right atrial pressure of 3 mmHg. FINDINGS  Left Ventricle: Left ventricular ejection fraction, by estimation, is 60 to 65%. The left  ventricle has normal function. The left ventricle has no regional wall motion abnormalities. Strain was performed and the global longitudinal strain is indeterminate. The left ventricular internal cavity size was normal in size. There is no left ventricular hypertrophy. Left ventricular diastolic parameters are consistent with Grade I diastolic dysfunction (impaired relaxation). Right Ventricle: The right ventricular size is normal. No increase in right ventricular wall thickness. Right ventricular systolic function is normal. Left Atrium: Left atrial size was normal in size. Right Atrium: Right atrial size was normal in size. Pericardium: Trivial pericardial effusion is present. The pericardial effusion is surrounding the apex and anterior to the right ventricle. Mitral Valve: The mitral valve is abnormal. There is mild thickening of the mitral valve leaflet(s). There is mild calcification of the mitral valve leaflet(s). Mild mitral annular calcification. Trivial mitral valve regurgitation. No evidence of mitral valve stenosis. Tricuspid Valve: The tricuspid valve is normal in structure. Tricuspid valve regurgitation is not demonstrated. No evidence of tricuspid stenosis. Aortic Valve: The aortic valve is tricuspid. There is mild calcification of the aortic valve. Aortic valve regurgitation is not visualized. Aortic valve sclerosis is present, with no evidence of aortic valve stenosis. Pulmonic Valve: The pulmonic valve was normal in structure. Pulmonic valve regurgitation is not visualized. No evidence of pulmonic stenosis. Aorta: The aortic root is normal in size and structure. Venous: The inferior vena cava is normal in size with greater than 50% respiratory variability, suggesting right atrial pressure of 3 mmHg. IAS/Shunts: The interatrial septum was not well visualized. Additional Comments: 3D was performed not requiring image post processing on an independent workstation and was indeterminate.  LEFT  VENTRICLE PLAX 2D LVIDd:         4.00 cm   Diastology LVIDs:         2.70 cm   LV e' medial:    7.29 cm/s LV PW:         0.90 cm   LV E/e' medial:  10.7 LV IVS:        1.10 cm   LV e' lateral:   11.10 cm/s LVOT diam:     2.00 cm   LV E/e' lateral: 7.0 LV SV:         56 LV SV Index:   28 LVOT Area:     3.14 cm  RIGHT VENTRICLE RV S prime:     12.80 cm/s TAPSE (M-mode): 2.1 cm LEFT ATRIUM             Index        RIGHT ATRIUM           Index LA diam:  3.20 cm 1.59 cm/m   RA Area:     13.90 cm LA Vol (A2C):   37.4 ml 18.53 ml/m  RA Volume:   34.50 ml  17.09 ml/m LA Vol (A4C):   40.3 ml 19.97 ml/m LA Biplane Vol: 41.0 ml 20.32 ml/m  AORTIC VALVE LVOT Vmax:   92.20 cm/s LVOT Vmean:  59.700 cm/s LVOT VTI:    0.178 m  AORTA Ao Root diam: 2.20 cm Ao Asc diam:  3.20 cm MITRAL VALVE MV Area (PHT): 4.15 cm     SHUNTS MV Decel Time: 183 msec     Systemic VTI:  0.18 m MV E velocity: 78.10 cm/s   Systemic Diam: 2.00 cm MV A velocity: 129.00 cm/s MV E/A ratio:  0.61 Janelle Mediate MD Electronically signed by Janelle Mediate MD Signature Date/Time: 07/09/2023/4:38:36 PM    Final    Recent Labs    07/08/23 0634  WBC 12.8*  HGB 12.2  HCT 36.3  PLT 502*   Recent Labs    07/08/23 0634  NA 137  K 3.8  CL 103  CO2 25  GLUCOSE 138*  BUN 19  CREATININE 0.74  CALCIUM  8.6*    Intake/Output Summary (Last 24 hours) at 07/10/2023 1214 Last data filed at 07/09/2023 2022 Gross per 24 hour  Intake 120 ml  Output --  Net 120 ml        Physical Exam: Vital Signs Blood pressure (!) 157/60, pulse 84, temperature 98.4 F (36.9 C), temperature source Oral, resp. rate 18, height 5\' 5"  (1.651 m), weight 93 kg, last menstrual period 03/02/1984, SpO2 98%.     General: awake, alert, appropriate,  sitting up in bed; seen 2x; NAD HENT: conjugate gaze; oropharynx moist CV: regular rate and rhythm- chest wall TTP throughout; ; no JVD Pulmonary: CTA B/L; no W/R/R- good air movement GI: soft, NT, ND, (+)BS- abd  incision staples intact with surrounding bruising- PEG looks good;  Psychiatric: appropriate- but a little flat/down Neurological: Ox3  Skin: Ecchymotic area LUE at site of prior IV?    Neuro:   Alert and oriented x 3. Normal insight and awareness. Intact Memory. Normal language and speech. Cranial nerve exam unremarkable. MMT: BUE 4-/5 proximally to 4/5 distally. BLE 3+/5 HF, 4/5 KE and 4+/5 ADF/PF. Sensory exam normal for light touch and pain in all 4 limbs. No limb ataxia or cerebellar signs. No abnormal tone appreciated.  .     Neuro exam stable 5/9  SENSORY: Normal to touch all 4 extremities  Coordination: Normal finger to nose and heel to shin, no tremor, no dysmetria   MSK: Chronic arthritic changes in hands/fingers.     Assessment/Plan: 1. Functional deficits which require 3+ hours per day of interdisciplinary therapy in a comprehensive inpatient rehab setting. Physiatrist is providing close team supervision and 24 hour management of active medical problems listed below. Physiatrist and rehab team continue to assess barriers to discharge/monitor patient progress toward functional and medical goals  Care Tool:  Bathing    Body parts bathed by patient: Right arm, Left arm, Chest, Front perineal area, Left upper leg, Right lower leg, Face   Body parts bathed by helper: Buttocks, Abdomen, Left lower leg, Right lower leg     Bathing assist Assist Level: Moderate Assistance - Patient 50 - 74%     Upper Body Dressing/Undressing Upper body dressing   What is the patient wearing?: Pull over shirt    Upper body assist Assist Level: Contact Guard/Touching assist  Lower Body Dressing/Undressing Lower body dressing      What is the patient wearing?: Underwear/pull up, Pants     Lower body assist Assist for lower body dressing: Maximal Assistance - Patient 25 - 49%     Toileting Toileting    Toileting assist Assist for toileting: Moderate Assistance - Patient 50 -  74%     Transfers Chair/bed transfer  Transfers assist     Chair/bed transfer assist level: Minimal Assistance - Patient > 75%     Locomotion Ambulation   Ambulation assist      Assist level: Minimal Assistance - Patient > 75% Assistive device: Walker-rolling Max distance: 76ft   Walk 10 feet activity   Assist  Walk 10 feet activity did not occur: Safety/medical concerns (dizziness, pain)        Walk 50 feet activity   Assist Walk 50 feet with 2 turns activity did not occur: Safety/medical concerns (dizziness, pain)         Walk 150 feet activity   Assist Walk 150 feet activity did not occur: Safety/medical concerns (dizziness, pain)         Walk 10 feet on uneven surface  activity   Assist Walk 10 feet on uneven surfaces activity did not occur: Safety/medical concerns (dizziness, pain)         Wheelchair     Assist Is the patient using a wheelchair?: Yes Type of Wheelchair: Manual    Wheelchair assist level: Dependent - Patient 0% Max wheelchair distance: 140ft    Wheelchair 50 feet with 2 turns activity    Assist        Assist Level: Dependent - Patient 0%   Wheelchair 150 feet activity     Assist      Assist Level: Dependent - Patient 0%   Blood pressure (!) 157/60, pulse 84, temperature 98.4 F (36.9 C), temperature source Oral, resp. rate 18, height 5\' 5"  (1.651 m), weight 93 kg, last menstrual period 03/02/1984, SpO2 98%.   Medical Problem List and Plan: 1. Functional deficits secondary to debility after hiatal hernia repair/fundoplication/splenic artery repair and subsequent complications             -patient may shower             -ELOS/Goals: 10-12 days, supervision goals with PT and OT  -Con't CIR- hold therapy today because DVTs dx'd at 6pm last night- can restart tomorrow- can get dressed/bathe in bed 2.  Antithrombotics: -DVT/anticoagulation:  LLE DVTs- multiple- got OK for Tx dose lovenox since labs  delayed on getting Heparin  levels- wil do 90 mg SQ on thighs or triceps BID- and stop Heparin  gtt- and check labs daily              -antiplatelet therapy: N/A 3. Pain Management: Oxycodone  prn severe pain and ultram  prn moderate pain             --decrease tylenol  to 650 mg qid.   -Continue robaxin3 4. Mood/Behavior/Sleep: LCSW to follow for evaluation and support.             --team to provide encouragement/ego support.              -antipsychotic agents: N/A  -5/7 patient would like to use as diphenhydramine  as needed for sleep, will DC trazodone  for now.  She uses Tylenol  PM at home which is Tylenol  and diphenhydramine  5. Neuropsych/cognition: This patient is capable of making decisions on her own behalf. 6. Skin/Wound Care: Routine  pressure relief measures. Monitor incisions for healing. No dressings needed 7. Fluids/Electrolytes/Nutrition: Monitor I/O. Change breeze to ensure to avoid GERD             --friends to bring in food/supplements from home             --will continue to monitor for hypoglycemic episode.   5/7 will DC CBG in am, continue lunch,dinner and HS  -DC CBG  -5/9 Adjust protonix  to before meals as more effective taken in this manner 8. Acute on chronic anemia: Has had rise in H/H from 6.1-->13.4 question accurate. Transfused with 4 units post op. --Will recheck in am  --was getting Iron infusions by Dr. Salomon Cree.  Her hematologist was notified about her admission  5/7 HGB stable 11.6 5/10- labs in AM/ and daily for 3 days-  9. Mediastinal hematoma: Had episode of hematemesis vs hemoptysis last night (isolated) --Will check CXR for follow up.  **CXR reviewed, ?CHF, has EF of 65%, chest clear on exam             -follow weights. Monitor clinically for now             -she's already a little dry by BUN             -re check labs in AM -5/7 will give lasix , 40mg  today and 20mg  daily, and potassium supplement. BUN/Cr stable-continue to monitor. Continue Delsym  for cough  prn -5/8 Will order echo- last one I see is from 2008?   5/9 check echocardiogram today, discussed with patient 5/10- pt's family was asking about ECHO- went over results again with pt on 2nd visit to speak with her.  10. Chest wall pain: Likely due to CPR. Continue to encourage deep breathing/cough every hour  -5/8 lidocaine  patches ordered x2- providing mild benefit  11. Persistent leucocytosis: Monitor for fevers and signs of infection             --WBC has trended in 14-18 range.              -afebrile at present  -5/8 WBC down to 12.8  Recheck monday Episode of Hematemesis/Nocturnal cough: Continues to have chest pain 12. Constipation: Has not had BM since last week?Aaron Aas Moderate stool noted in colon on X rays.  and relays abdominal fullness.               --will add senna to colace              -does realize that her po intake needs to pick up as well  -5/8 multiple BMs over the past 24 hours, likely had buildup of stool over the past several days, continue current regimen for now  5/9 DC senokot, lactaid  5/10- still having multiple loose stools- 3-4x/ yesterday- will monitor with liquid diet she' wants due to swallowing issues 13. Urinary retention:  Has voided twice since foley came out yesterday. Needs to drink more             --will monitor voiding with bladder scans.  -5/8 continent PVR 0 today  -5/9 remains continent, monitor  14. Pre-renal azotemia: Encourage fluid/po intake.   5/8 BUN and creatinine stable at 19/0.74 15. RA/Psoriatic arthritis: Reports diffuse pain for past month (due to need to hold meds pre-op)   --Has been cleared to resume Methotrexate  (today) and Simponi  (friend to bring in tomorrow)  16. LLE DVT's- multiple-  5/10- changed to SQ tx dose lovenox 90 mg  BID- to be given on triceps or thighs- not abdomen per pt request- since has incision and PEG.    I spent a total of  57  minutes on total care today- >50% coordination of care- due to  Seen pt x2- spoke  with nursing x3 as well as pharmacy and general surgery- Dr Aniceto Barley about Lovenox- labs ordered for daily- next 3 days  LOS: 4 days A FACE TO FACE EVALUATION WAS PERFORMED  Jakaya Jacobowitz 07/10/2023, 12:14 PM

## 2023-07-10 NOTE — Progress Notes (Signed)
 Occupational Therapy Session Note  Patient Details  Name: Jade Cardenas MRN: [id number] Date of Birth: 03/12/1950  Today's Date: 07/10/2023 OT Individual Time: 9528-4132 OT Individual Time Calculation (min): 60 min    Short Term Goals: Week 1:  OT Short Term Goal 1 (Week 1): Patient will amb with CGA to bathroom with DME/RW OT Short Term Goal 2 (Week 1): Patient will complete LB self care with min A with AE OT Short Term Goal 3 (Week 1): Patient will stand sinkside for 2 minutes for grooming with close S  Skilled Therapeutic Interventions/Progress Updates:    Patient in bed at the time of arrival with family present at the time of treatment. Patient indicated that she wasn't able to rest on last night and that she had a pain response of 8 on a 0-10 for rib and low back pain.  Nursing came in and was able to disconnect the heprin line and the pt was able to complete UB/LB bathing and dressing EOB.  The pt was able to come from supine to EOB with CGA, The pt was able to doff an overhead shirt with MinA and she was able to remove her pajama bottoms with MinA as well using the RW for additional standing balance and for pushing her pants down off  of her bottom.  The pt was able to wash her UB with s/uA, she was ModA for her LB inclusive of the upper portion of BLE and her  private region.  The pt was MaxA for her bottom and  BLE, inclusive of her feet. The pt was able to donn her over head shirt with MinA and she was MinA for her pants using the reacher  after  demonstration and vc's. The pt was transported to the recliner using the RW for balance at [facility]  The pt  went on to  brush her teeth with s/u A.  At the end of the session, the call light and bedside table were placed within reach with all addiitonal needs addressed.   Therapy Documentation Precautions:  Precautions Precautions: Fall Recall of Precautions/Restrictions: Intact Restrictions Weight Bearing Restrictions Per Provider Order:  No  Therapy/Group: Individual Therapy  Moises Ang 07/10/2023, 4:40 PM

## 2023-07-10 NOTE — Progress Notes (Signed)
 Physical Therapy Session Note  Patient Details  Name: GWENNETH JARNAGIN MRN: [id number] Date of Birth: Jul 09, 1950  Today's Date: 07/10/2023 PT Individual Time: 0815-0825 PT Individual Time Calculation (min): 10 min  Today's Date: 07/10/2023 PT Missed Time: 50 Minutes Missed Time Reason: MD hold (Comment) (DVT)  Short Term Goals: Week 1:  PT Short Term Goal 1 (Week 1): STG=LTG due to LOS  Skilled Therapeutic Interventions/Progress Updates:   Received pt semi-reclined in bed connected to Heparin  IV. Per pt/chart review, pt with new LLE DVT. Consulted with RN and MD - pt placed on therapy hold today due to DVT. Discussed therapy schedule for remainder of day and pt requesting to get washed up with OT - received clearance from MD to do so from bed level. Assisted with repositioning for comfort and concluded session with pt semi-reclined in bed, needs within reach, and bed alarm on. 50 minutes missed of skilled physical therapy due to medical hold.    Therapy Documentation Precautions:  Precautions Precautions: Fall Recall of Precautions/Restrictions: Intact Restrictions Weight Bearing Restrictions Per Provider Order: No  Therapy/Group: Individual Therapy Nicolas Barren Zaunegger Nena Bank PT, DPT 07/10/2023, 6:58 AM

## 2023-07-10 NOTE — Progress Notes (Signed)
 PHARMACY - ANTICOAGULATION CONSULT NOTE  Pharmacy Consult for heparin  Indication: LLE DVT  Allergies  Allergen Reactions   Erythromycin Hives, Swelling and Other (See Comments)    All Mycins; throat swells shut and she gets "really hot"   Iodinated Contrast Media Shortness Of Breath and Swelling    CT contrast; throat swelling, difficulty breathing.  In [place]  "many years ago."   Ace Inhibitors Cough   Indomethacin Other (See Comments)   Keflex [Cephalexin] Hives   Macrobid  [Nitrofurantoin ] Other (See Comments)    dizziness    Patient Measurements: Height: 5\' 5"  (165.1 cm) Weight: 93 kg (205 lb 0.4 oz) IBW/kg (Calculated) : 57 HEPARIN  DW (KG): 78.9  Vital Signs: Temp: 98.4 F (36.9 C) (05/10 0414) Temp Source: Oral (05/10 0414) BP: 157/60 (05/10 0414) Pulse Rate: 84 (05/10 0414)  Labs: Recent Labs    07/08/23 0634  HGB 12.2  HCT 36.3  PLT 502*  CREATININE 0.74    Estimated Creatinine Clearance: 72.7 mL/min (by C-G formula based on SCr of 0.74 mg/dL).   Medical History: Past Medical History:  Diagnosis Date   Anemia    iron def   Arthritis    rheumatoid, psoriatic, osteoarthritis    Bone spur    RIGHT FOOT    Broken toe    Bruises easily    Cellulitis    Chronic back pain    COLONIC POLYPS, HX OF 02/09/2007   Cough, persistent 01/21/2012   poss from acei    DES exposure in utero, unknown    Femur fracture, left (HCC)    Fibromyalgia    doesn't require meds   GASTRIC ULCER, ACUTE, HEMORRHAGE, HX OF 02/09/2007   GERD (gastroesophageal reflux disease)    takes Nexium  daily   Goiter    H/O hiatal hernia    H/O: hysterectomy    when 73 yrs old   Headache    Occasional migraines with increased stress   Hx of seasonal allergies    takes Claritin  daily   HYPERLIPIDEMIA 12/29/2006   takes Lovastatin  nightly   HYPERTENSION 12/29/2006   takes Enalapril  daily   IBS 04/28/2007   Insomnia    related to pain;takes Flexeril  and Tylenol  PM nightly    Joint pain    Joint swelling    Neuropathy    small cell   Osteopenia    Palpitations 02/09/2007   PONV (postoperative nausea and vomiting)    NAUSEA   REDUCTION MAMMOPLASTY, HX OF 02/09/2007   Right bundle branch block 02/09/2007   S/P lumbar fusion 6 13 09/03/2011   L4 L5  posterior    SYNCOPE 12/07/2007   Tendonitis    RLE    UNS ADVRS EFF OTH RX MEDICINAL\T\BIOLOGICAL SBSTNC 02/09/2007     Assessment: 73 yo W with Cameron's ulcers, chronic anemia, and large hiatal hernia s/p repair with PEG placement 06/29/23 found to have acute and age indeterminate LLE DVTs. Patient had significant bleeding during procedure s/p splenic artery ligation. Patient's hgb was 6.1 on 5/4 s/p 2 units RBC. Patient has been off all anticoagulation since 5/1. Plan to start heparin  and monitor Hgb for a few days before switch to DOAC.    Goal of Therapy:  Monitor platelets by anticoagulation protocol: Yes   Plan:  DC heparin  Start lovenox 90 mg Alexander q12h Monitor for signs of bleeding, CBC daily F/up on transition to DOAC in a day or two Surgery is managing time frame for transition  Alekzander Cardell BS, PharmD, BCPS Clinical  Pharmacist 07/10/2023 9:20 AM  Contact: [phone] after 3 PM  "Be curious, not judgmental..." -Rumalda Counter

## 2023-07-11 LAB — HEMOGLOBIN AND HEMATOCRIT, BLOOD
HCT: 36.3 % (ref 36.0–46.0)
Hemoglobin: 12 g/dL (ref 12.0–15.0)

## 2023-07-11 MED ORDER — LACTASE 3000 UNITS PO TABS
6000.0000 [IU] | ORAL_TABLET | Freq: Three times a day (TID) | ORAL | Status: DC
  Administered 2023-07-11 – 2023-07-15 (×12): 6000 [IU] via ORAL
  Filled 2023-07-11 (×14): qty 2

## 2023-07-11 MED ORDER — LOSARTAN POTASSIUM 25 MG PO TABS
25.0000 mg | ORAL_TABLET | Freq: Every day | ORAL | Status: DC
  Administered 2023-07-11 – 2023-07-13 (×3): 25 mg via ORAL
  Filled 2023-07-11 (×3): qty 1

## 2023-07-11 MED ORDER — FAMOTIDINE 20 MG PO TABS
40.0000 mg | ORAL_TABLET | Freq: Every day | ORAL | Status: DC
  Administered 2023-07-11 – 2023-07-15 (×5): 40 mg via ORAL
  Filled 2023-07-11 (×5): qty 2

## 2023-07-11 NOTE — Progress Notes (Signed)
 Occupational Therapy Session Note  Patient Details  Name: Jade Cardenas MRN: [id number] Date of Birth: [birth year]  Today's Date: 07/11/2023 OT Individual Time: 7829-5621 OT Individual Time Calculation (min): 45 min    Short Term Goals: Week 1:  OT Short Term Goal 1 (Week 1): Patient will amb with CGA to bathroom with DME/RW OT Short Term Goal 2 (Week 1): Patient will complete LB self care with min A with AE OT Short Term Goal 3 (Week 1): Patient will stand sinkside for 2 minutes for grooming with close S  Skilled Therapeutic Interventions/Progress Updates:    Pt resting in bed upon arrival. OT intervention with focus on bed mobility, sitting balance, sit<>stand, funcitonal amb with RW, DME recommendations, and discharge planning to increase independence with BADLs. Pt bed has adjustable head and foot but no rails. Supine<>EOB using no rails with HOB elevated with supervision. Pt donned household slippers wihtout assistance. All sitting balance with supervision. Sit<>stand with supervision. Pt practiced side-stepping to simulate entry/exit from home bathrooms-supervision. Block practice sit<>stand from EOB for general conditioning 2x5 with pt reporting fatigue. Pt has all necessary DME at home. Pt returned to bed. All needs within reahc. Bed alarm activated.   Therapy Documentation Precautions:  Precautions Precautions: Fall Recall of Precautions/Restrictions: Intact Restrictions Weight Bearing Restrictions Per Provider Order: No   Pain:  Pt c/o 8/10 rib pain from chest compressions; repositioning and meds admin prior to therapy    Therapy/Group: Individual Therapy  Doak Free 07/11/2023, 11:04 AM

## 2023-07-11 NOTE — Plan of Care (Signed)
  Problem: Consults Goal: RH GENERAL PATIENT EDUCATION Description: See Patient Education module for education specifics. Outcome: Progressing   Problem: RH BOWEL ELIMINATION Goal: RH STG MANAGE BOWEL WITH ASSISTANCE Description: STG Manage Bowel with mod I Assistance. Outcome: Progressing Goal: RH STG MANAGE BOWEL W/MEDICATION W/ASSISTANCE Description: STG Manage Bowel with Medication with mod I Assistance. Outcome: Progressing   Problem: RH BLADDER ELIMINATION Goal: RH STG MANAGE BLADDER WITH ASSISTANCE Description: STG Manage Bladder With mod I Assistance Outcome: Progressing Goal: RH STG MANAGE BLADDER WITH MEDICATION WITH ASSISTANCE Description: STG Manage Bladder With Medication With mod I Assistance. Outcome: Progressing   Problem: RH SAFETY Goal: RH STG ADHERE TO SAFETY PRECAUTIONS W/ASSISTANCE/DEVICE Description: STG Adhere to Safety Precautions With cues Assistance/Device. Outcome: Progressing   Problem: RH PAIN MANAGEMENT Goal: RH STG PAIN MANAGED AT OR BELOW PT'S PAIN GOAL Description: Pain < 4 with prns Outcome: Progressing   Problem: RH KNOWLEDGE DEFICIT GENERAL Goal: RH STG INCREASE KNOWLEDGE OF SELF CARE AFTER HOSPITALIZATION Description: Patient and friends will be able to manage care at discharge using educational resources for medication, dietary modification, skin care, etc independently Outcome: Progressing   Problem: RH BLADDER ELIMINATION Goal: RH STG MANAGE BLADDER WITH ASSISTANCE Description: STG Manage Bladder With mod I  Assistance Outcome: Progressing Goal: RH STG MANAGE BLADDER WITH MEDICATION WITH ASSISTANCE Description: STG Manage Bladder With Medication With mod I Assistance. Outcome: Progressing

## 2023-07-11 NOTE — Progress Notes (Signed)
 Occupational Therapy Session Note  Patient Details  Name: Jade Cardenas MRN: [id number] Date of Birth: [birth year]  {CHL IP REHAB OT TIME CALCULATIONS:[id number]}   Short Term Goals: Week 1:  OT Short Term Goal 1 (Week 1): Patient will amb with CGA to bathroom with DME/RW OT Short Term Goal 2 (Week 1): Patient will complete LB self care with min A with AE OT Short Term Goal 3 (Week 1): Patient will stand sinkside for 2 minutes for grooming with close S  Skilled Therapeutic Interventions/Progress Updates:    Patient agreeable to participate in OT session. Reports *** pain level.   Patient participated in skilled OT session focusing on ***. Therapist facilitated/assessed/developed/educated/integrated/elicited *** in order to improve/facilitate/promote    Therapy Documentation Precautions:  Precautions Precautions: Fall Recall of Precautions/Restrictions: Intact Restrictions Weight Bearing Restrictions Per Provider Order: No  Therapy/Group: Individual Therapy  Carollee Circle, OTR/L,CBIS  Supplemental OT - [facility] and [facility] Secure Chat Preferred   07/11/2023, 9:03 PM

## 2023-07-11 NOTE — Progress Notes (Signed)
 PROGRESS NOTE   Subjective/Complaints:  Pt reports got injections in triceps of lovenox-  Says she's on Losartan  at home for BP and was wondering if that's why her BP is running high.  Also having burning reflux also at lunch- so wants something for that.  Also reports would like ot try lactaid- will order.  Hugest BM "ever' yesterday.   Asking what to do for pain- wants more tylenol  or Advil but explained canot do Advil due to DVT's- since on blood thinners, it increases risk of bleeding.  Also LFTs were elevated, so they reduced her tylenol - needs to take Oxycodone , unfortunately, if hurting since tramadol  doesn't work for her- d/c'd tramadol  per pt request.   Slept better and ate better in last 24 hours.     ROS:    Pt denies SOB, abd pain, CP, N/V/C/D, and vision changes  + chronic joint pain - wants more tylenol - explained why cannot + frequent BMs  Objective:   VAS US  LOWER EXTREMITY VENOUS (DVT) Result Date: 07/10/2023  Lower Venous DVT Study Patient Name:  EVYNNE CLONINGER  Date of Exam:   07/09/2023 Medical Rec #: [id number]          Accession #:    [id number] Date of Birth: 11-02-1950          Patient Gender: F Patient Age:   73 years Exam Location:  [facility] [place] Procedure:      VAS US  LOWER EXTREMITY VENOUS (DVT) Referring Phys: PAMELA LOVE --------------------------------------------------------------------------------  Other Indications: History of venous reflux disease, diagnosed 10/01/2016. Risk Factors: Immobility Status post CPR X 2 during GI surgery. Comparison Study: No prior LEV on file Performing Technologist: Carleene Chase RVS  Examination Guidelines: A complete evaluation includes B-mode imaging, spectral Doppler, color Doppler, and power Doppler as needed of all accessible portions of each vessel. Bilateral testing is considered an integral part of a complete examination. Limited examinations for  reoccurring indications may be performed as noted. The reflux portion of the exam is performed with the patient in reverse Trendelenburg.  +---------+---------------+---------+-----------+----------+--------------+ RIGHT    CompressibilityPhasicitySpontaneityPropertiesThrombus Aging +---------+---------------+---------+-----------+----------+--------------+ CFV      Full           Yes      Yes                                 +---------+---------------+---------+-----------+----------+--------------+ SFJ      Full                                                        +---------+---------------+---------+-----------+----------+--------------+ FV Prox  Full                                                        +---------+---------------+---------+-----------+----------+--------------+ FV Mid  Full                                                        +---------+---------------+---------+-----------+----------+--------------+ FV DistalFull                                                        +---------+---------------+---------+-----------+----------+--------------+ PFV      Full                                                        +---------+---------------+---------+-----------+----------+--------------+ POP      Full           Yes      Yes                                 +---------+---------------+---------+-----------+----------+--------------+ PTV      Partial                                      Acute          +---------+---------------+---------+-----------+----------+--------------+ PERO     Partial                                      Acute          +---------+---------------+---------+-----------+----------+--------------+   +---------+---------------+---------+-----------+----------+-----------------+ LEFT     CompressibilityPhasicitySpontaneityPropertiesThrombus Aging     +---------+---------------+---------+-----------+----------+-----------------+ CFV      Full                                                           +---------+---------------+---------+-----------+----------+-----------------+ SFJ      Full                                                           +---------+---------------+---------+-----------+----------+-----------------+ FV Prox  Full                                                           +---------+---------------+---------+-----------+----------+-----------------+ FV Mid   Full                                                           +---------+---------------+---------+-----------+----------+-----------------+  FV DistalPartial        No       No                   Age Indeterminate +---------+---------------+---------+-----------+----------+-----------------+ PFV      Full                                                           +---------+---------------+---------+-----------+----------+-----------------+ POP      Partial        No       No                   Age Indeterminate +---------+---------------+---------+-----------+----------+-----------------+ PTV      Full                                                           +---------+---------------+---------+-----------+----------+-----------------+ PERO     None                                         Acute             +---------+---------------+---------+-----------+----------+-----------------+     Summary: RIGHT: - Findings consistent with acute deep vein thrombosis involving the right posterior tibial veins, and right peroneal veins.  - No cystic structure found in the popliteal fossa.  LEFT: - Findings consistent with acute deep vein thrombosis involving the left peroneal veins.  - Findings consistent with age indeterminate deep vein thrombosis involving the distal left femoral vein, and left popliteal vein.  - No cystic structure  found in the popliteal fossa.  *See table(s) above for measurements and observations. Electronically signed by Angela Kell MD on 07/10/2023 at 9:53:25 AM.    Final    ECHOCARDIOGRAM COMPLETE Result Date: 07/09/2023    ECHOCARDIOGRAM REPORT   Patient Name:   TOMA SALZANO Date of Exam: 07/09/2023 Medical Rec #:  [id number]         Height:       65.0 in Accession #:    [id number]        Weight:       209.7 lb Date of Birth:  1951/02/05         BSA:          2.018 m Patient Age:    73 years          BP:           131/58 mmHg Patient Gender: F                 HR:           83 bpm. Exam Location:  Inpatient Procedure: 2D Echo, Cardiac Doppler and Color Doppler (Both Spectral and Color            Flow Doppler were utilized during procedure). Indications:    Congestive Heart Failure  History:        Patient has no prior history of Echocardiogram examinations.  Sonographer:    Janette Medley Referring Phys: [id number]  SHTRIDELMAN IMPRESSIONS  1. Left ventricular ejection fraction, by estimation, is 60 to 65%. The left ventricle has normal function. The left ventricle has no regional wall motion abnormalities. Left ventricular diastolic parameters are consistent with Grade I diastolic dysfunction (impaired relaxation).  2. Right ventricular systolic function is normal. The right ventricular size is normal.  3. The mitral valve is abnormal. Trivial mitral valve regurgitation. No evidence of mitral stenosis.  4. The aortic valve is tricuspid. There is mild calcification of the aortic valve. Aortic valve regurgitation is not visualized. Aortic valve sclerosis is present, with no evidence of aortic valve stenosis.  5. The inferior vena cava is normal in size with greater than 50% respiratory variability, suggesting right atrial pressure of 3 mmHg. FINDINGS  Left Ventricle: Left ventricular ejection fraction, by estimation, is 60 to 65%. The left ventricle has normal function. The left ventricle has no regional wall motion  abnormalities. Strain was performed and the global longitudinal strain is indeterminate. The left ventricular internal cavity size was normal in size. There is no left ventricular hypertrophy. Left ventricular diastolic parameters are consistent with Grade I diastolic dysfunction (impaired relaxation). Right Ventricle: The right ventricular size is normal. No increase in right ventricular wall thickness. Right ventricular systolic function is normal. Left Atrium: Left atrial size was normal in size. Right Atrium: Right atrial size was normal in size. Pericardium: Trivial pericardial effusion is present. The pericardial effusion is surrounding the apex and anterior to the right ventricle. Mitral Valve: The mitral valve is abnormal. There is mild thickening of the mitral valve leaflet(s). There is mild calcification of the mitral valve leaflet(s). Mild mitral annular calcification. Trivial mitral valve regurgitation. No evidence of mitral valve stenosis. Tricuspid Valve: The tricuspid valve is normal in structure. Tricuspid valve regurgitation is not demonstrated. No evidence of tricuspid stenosis. Aortic Valve: The aortic valve is tricuspid. There is mild calcification of the aortic valve. Aortic valve regurgitation is not visualized. Aortic valve sclerosis is present, with no evidence of aortic valve stenosis. Pulmonic Valve: The pulmonic valve was normal in structure. Pulmonic valve regurgitation is not visualized. No evidence of pulmonic stenosis. Aorta: The aortic root is normal in size and structure. Venous: The inferior vena cava is normal in size with greater than 50% respiratory variability, suggesting right atrial pressure of 3 mmHg. IAS/Shunts: The interatrial septum was not well visualized. Additional Comments: 3D was performed not requiring image post processing on an independent workstation and was indeterminate.  LEFT VENTRICLE PLAX 2D LVIDd:         4.00 cm   Diastology LVIDs:         2.70 cm   LV e'  medial:    7.29 cm/s LV PW:         0.90 cm   LV E/e' medial:  10.7 LV IVS:        1.10 cm   LV e' lateral:   11.10 cm/s LVOT diam:     2.00 cm   LV E/e' lateral: 7.0 LV SV:         56 LV SV Index:   28 LVOT Area:     3.14 cm  RIGHT VENTRICLE RV S prime:     12.80 cm/s TAPSE (M-mode): 2.1 cm LEFT ATRIUM             Index        RIGHT ATRIUM           Index LA diam:  3.20 cm 1.59 cm/m   RA Area:     13.90 cm LA Vol (A2C):   37.4 ml 18.53 ml/m  RA Volume:   34.50 ml  17.09 ml/m LA Vol (A4C):   40.3 ml 19.97 ml/m LA Biplane Vol: 41.0 ml 20.32 ml/m  AORTIC VALVE LVOT Vmax:   92.20 cm/s LVOT Vmean:  59.700 cm/s LVOT VTI:    0.178 m  AORTA Ao Root diam: 2.20 cm Ao Asc diam:  3.20 cm MITRAL VALVE MV Area (PHT): 4.15 cm     SHUNTS MV Decel Time: 183 msec     Systemic VTI:  0.18 m MV E velocity: 78.10 cm/s   Systemic Diam: 2.00 cm MV A velocity: 129.00 cm/s MV E/A ratio:  0.61 Janelle Mediate MD Electronically signed by Janelle Mediate MD Signature Date/Time: 07/09/2023/4:38:36 PM    Final    Recent Labs    07/11/23 0915  HGB 12.0  HCT 36.3   No results for input(s): "NA", "K", "CL", "CO2", "GLUCOSE", "BUN", "CREATININE", "CALCIUM " in the last 72 hours.   Intake/Output Summary (Last 24 hours) at 07/11/2023 1106 Last data filed at 07/10/2023 1821 Gross per 24 hour  Intake 676 ml  Output --  Net 676 ml        Physical Exam: Vital Signs Blood pressure (!) 157/61, pulse 88, temperature 98.4 F (36.9 C), temperature source Oral, resp. rate 17, height 5\' 5"  (1.651 m), weight 93 kg, last menstrual period 03/02/1984, SpO2 97%.      General: awake, alert, appropriate, sitting up in bed, NAD HENT: conjugate gaze; oropharynx moist CV: regular rate and rhythm; no JVD Pulmonary: CTA B/L; no W/R/R- good air movement GI: soft, NT, ND, (+)BS Psychiatric: appropriate- brighter affect today Neurological: Ox3  Skin: Ecchymotic area LUE at site of prior IV?    Neuro:   Alert and oriented x 3. Normal  insight and awareness. Intact Memory. Normal language and speech. Cranial nerve exam unremarkable. MMT: BUE 4-/5 proximally to 4/5 distally. BLE 3+/5 HF, 4/5 KE and 4+/5 ADF/PF. Sensory exam normal for light touch and pain in all 4 limbs. No limb ataxia or cerebellar signs. No abnormal tone appreciated.  .     Neuro exam stable 5/9  SENSORY: Normal to touch all 4 extremities  Coordination: Normal finger to nose and heel to shin, no tremor, no dysmetria   MSK: Chronic arthritic changes in hands/fingers.     Assessment/Plan: 1. Functional deficits which require 3+ hours per day of interdisciplinary therapy in a comprehensive inpatient rehab setting. Physiatrist is providing close team supervision and 24 hour management of active medical problems listed below. Physiatrist and rehab team continue to assess barriers to discharge/monitor patient progress toward functional and medical goals  Care Tool:  Bathing    Body parts bathed by patient: Right arm, Left arm, Chest, Front perineal area, Left upper leg, Right lower leg, Face   Body parts bathed by helper: Buttocks, Abdomen, Left lower leg, Right lower leg     Bathing assist Assist Level: Moderate Assistance - Patient 50 - 74%     Upper Body Dressing/Undressing Upper body dressing   What is the patient wearing?: Pull over shirt    Upper body assist Assist Level: Contact Guard/Touching assist    Lower Body Dressing/Undressing Lower body dressing      What is the patient wearing?: Underwear/pull up, Pants     Lower body assist Assist for lower body dressing: Maximal Assistance - Patient 25 - 49%  Toileting Toileting    Toileting assist Assist for toileting: Moderate Assistance - Patient 50 - 74%     Transfers Chair/bed transfer  Transfers assist     Chair/bed transfer assist level: Minimal Assistance - Patient > 75%     Locomotion Ambulation   Ambulation assist      Assist level: Minimal Assistance -  Patient > 75% Assistive device: Walker-rolling Max distance: 37ft   Walk 10 feet activity   Assist  Walk 10 feet activity did not occur: Safety/medical concerns (dizziness, pain)        Walk 50 feet activity   Assist Walk 50 feet with 2 turns activity did not occur: Safety/medical concerns (dizziness, pain)         Walk 150 feet activity   Assist Walk 150 feet activity did not occur: Safety/medical concerns (dizziness, pain)         Walk 10 feet on uneven surface  activity   Assist Walk 10 feet on uneven surfaces activity did not occur: Safety/medical concerns (dizziness, pain)         Wheelchair     Assist Is the patient using a wheelchair?: Yes Type of Wheelchair: Manual    Wheelchair assist level: Dependent - Patient 0% Max wheelchair distance: 13ft    Wheelchair 50 feet with 2 turns activity    Assist        Assist Level: Dependent - Patient 0%   Wheelchair 150 feet activity     Assist      Assist Level: Dependent - Patient 0%   Blood pressure (!) 157/61, pulse 88, temperature 98.4 F (36.9 C), temperature source Oral, resp. rate 17, height 5\' 5"  (1.651 m), weight 93 kg, last menstrual period 03/02/1984, SpO2 97%.   Medical Problem List and Plan: 1. Functional deficits secondary to debility after hiatal hernia repair/fundoplication/splenic artery repair and subsequent complications             -patient may shower             -ELOS/Goals: 10-12 days, supervision goals with PT and OT  -Con't CIR- can restart therapy today- d/w therapy   2.  Antithrombotics: -DVT/anticoagulation:  LLE DVTs- multiple- got OK for Tx dose lovenox since labs delayed on getting Heparin  levels- wil do 90 mg SQ on thighs or triceps BID- and stop Heparin  gtt- and check labs daily 5/11- Hb 12- doing in triceps which is Ok'd by Pharmacy-               -antiplatelet therapy: N/A 3. Pain Management: Oxycodone  prn severe pain and ultram  prn moderate pain              --decrease tylenol  to 650 mg qid.   -Continue robaxin3  5/11- D/c'd tramadol - since doesn't help her.  4. Mood/Behavior/Sleep: LCSW to follow for evaluation and support.             --team to provide encouragement/ego support.              -antipsychotic agents: N/A  -5/7 patient would like to use as diphenhydramine  as needed for sleep, will DC trazodone  for now.  She uses Tylenol  PM at home which is Tylenol  and diphenhydramine   5/11- sleeping better 5. Neuropsych/cognition: This patient is capable of making decisions on her own behalf. 6. Skin/Wound Care: Routine pressure relief measures. Monitor incisions for healing. No dressings needed 7. Fluids/Electrolytes/Nutrition: Monitor I/O. Change breeze to ensure to avoid GERD             --  friends to bring in food/supplements from home             --will continue to monitor for hypoglycemic episode.   5/7 will DC CBG in am, continue lunch,dinner and HS  -DC CBG  -5/9 Adjust protonix  to before meals as more effective taken in this manner  5/11- Protonix  changed to 6:30 and 4pm- added Pepcid 40 mg at 11am per pt request 8. Acute on chronic anemia: Has had rise in H/H from 6.1-->13.4 question accurate. Transfused with 4 units post op. --Will recheck in am  --was getting Iron infusions by Dr. Salomon Cree.  Her hematologist was notified about her admission  5/7 HGB stable 11.6 5/10- labs in AM/ and daily for 3 days-  5/11- Hb 12k today- doing better 9. Mediastinal hematoma: Had episode of hematemesis vs hemoptysis last night (isolated) --Will check CXR for follow up.  **CXR reviewed, ?CHF, has EF of 65%, chest clear on exam             -follow weights. Monitor clinically for now             -she's already a little dry by BUN             -re check labs in AM -5/7 will give lasix , 40mg  today and 20mg  daily, and potassium supplement. BUN/Cr stable-continue to monitor. Continue Delsym  for cough prn -5/8 Will order echo- last one I see is from  2008?   5/9 check echocardiogram today, discussed with patient 5/10- pt's family was asking about ECHO- went over results again with pt on 2nd visit to speak with her.  10. Chest wall pain: Likely due to CPR. Continue to encourage deep breathing/cough every hour  -5/8 lidocaine  patches ordered x2- providing mild benefit  11. Persistent leucocytosis: Monitor for fevers and signs of infection             --WBC has trended in 14-18 range.              -afebrile at present  -5/8 WBC down to 12.8  Recheck monday Episode of Hematemesis/Nocturnal cough: Continues to have chest pain 12. Constipation: Has not had BM since last week?Aaron Aas Moderate stool noted in colon on X rays.  and relays abdominal fullness.               --will add senna to colace              -does realize that her po intake needs to pick up as well  -5/8 multiple BMs over the past 24 hours, likely had buildup of stool over the past several days, continue current regimen for now  5/9 DC senokot, lactaid  5/10- still having multiple loose stools- 3-4x/ yesterday- will monitor with liquid diet she' wants due to swallowing issues  5/11- started lactid TID with meals for pt 13. Urinary retention:  Has voided twice since foley came out yesterday. Needs to drink more             --will monitor voiding with bladder scans.  -5/8 continent PVR 0 today  -5/9 remains continent, monitor  14. Pre-renal azotemia: Encourage fluid/po intake.   5/8 BUN and creatinine stable at 19/0.74 15. RA/Psoriatic arthritis: Reports diffuse pain for past month (due to need to hold meds pre-op)   --Has been cleared to resume Methotrexate  (today) and Simponi  (friend to bring in tomorrow)  16. LLE DVT's- multiple-  5/10- changed to SQ tx dose lovenox 90 mg BID-  to be given on triceps or thighs- not abdomen per pt request- since has incision and PEG.  17. Hx of HTN  5/11- restarted Home Losartan  25 mg daily- not sure of home dose, but this appears like will handle  BP elevation    I spent a total of 51   minutes on total care today- >50% coordination of care- due to  Was in room for 38 minutes and then reviewed labs, vitals; and B/B as well as made multiple meds changes- also d/w nursing  LOS: 5 days A FACE TO FACE EVALUATION WAS PERFORMED  Vonn Sliger 07/11/2023, 11:06 AM

## 2023-07-12 ENCOUNTER — Other Ambulatory Visit (HOSPITAL_COMMUNITY): Payer: Self-pay

## 2023-07-12 ENCOUNTER — Telehealth (HOSPITAL_COMMUNITY): Payer: Self-pay | Admitting: Pharmacy Technician

## 2023-07-12 ENCOUNTER — Encounter: Payer: Self-pay | Admitting: Hematology

## 2023-07-12 DIAGNOSIS — I10 Essential (primary) hypertension: Secondary | ICD-10-CM

## 2023-07-12 DIAGNOSIS — I824Y3 Acute embolism and thrombosis of unspecified deep veins of proximal lower extremity, bilateral: Secondary | ICD-10-CM

## 2023-07-12 DIAGNOSIS — T148XXA Other injury of unspecified body region, initial encounter: Secondary | ICD-10-CM

## 2023-07-12 LAB — BASIC METABOLIC PANEL WITH GFR
Anion gap: 9 (ref 5–15)
BUN: 16 mg/dL (ref 8–23)
CO2: 25 mmol/L (ref 22–32)
Calcium: 8.6 mg/dL — ABNORMAL LOW (ref 8.9–10.3)
Chloride: 104 mmol/L (ref 98–111)
Creatinine, Ser: 0.77 mg/dL (ref 0.44–1.00)
GFR, Estimated: >60 mL/min (ref >60)
Glucose, Bld: 118 mg/dL — ABNORMAL HIGH (ref 70–99)
Potassium: 3.5 mmol/L (ref 3.5–5.1)
Sodium: 138 mmol/L (ref 135–145)

## 2023-07-12 LAB — CBC
HCT: 35.9 % — ABNORMAL LOW (ref 36.0–46.0)
Hemoglobin: 11.9 g/dL — ABNORMAL LOW (ref 12.0–15.0)
MCH: 30.4 pg (ref 26.0–34.0)
MCHC: 33.1 g/dL (ref 30.0–36.0)
MCV: 91.6 fL (ref 80.0–100.0)
Platelets: 660 10*3/uL — ABNORMAL HIGH (ref 150–400)
RBC: 3.92 MIL/uL (ref 3.87–5.11)
RDW: 15.5 % (ref 11.5–15.5)
WBC: 11 10*3/uL — ABNORMAL HIGH (ref 4.0–10.5)
nRBC: 0 % (ref 0.0–0.2)

## 2023-07-12 MED ORDER — SUCRALFATE 1 G PO TABS
1.0000 g | ORAL_TABLET | Freq: Three times a day (TID) | ORAL | Status: DC
  Administered 2023-07-12 – 2023-07-15 (×9): 1 g via ORAL
  Filled 2023-07-12 (×11): qty 1

## 2023-07-12 NOTE — Progress Notes (Signed)
 Occupational Therapy Weekly Progress Note  Patient Details  Name: Jade Cardenas MRN: [id number] Date of Birth: [birth year]  Beginning of progress report period: Jul 03, 2023 End of progress report period: Jul 13, 2023  {CHL IP REHAB OT TIME CALCULATIONS:[id number]}   Patient has met 3 of 3 short term goals.    Patient continues to demonstrate the following deficits: muscle weakness and acute pain, decreased cardiorespiratoy endurance, and decreased standing balance, decreased postural control, and decreased balance strategies and therefore will continue to benefit from skilled OT intervention to enhance overall performance with BADL, iADL, and Reduce care partner burden.  Patient progressing toward long term goals..  Continue plan of care.  OT Short Term Goals Week 1:  OT Short Term Goal 1 (Week 1): Patient will amb with CGA to bathroom with DME/RW OT Short Term Goal 1 - Progress (Week 1): Met OT Short Term Goal 2 (Week 1): Patient will complete LB self care with min A with AE OT Short Term Goal 2 - Progress (Week 1): Met OT Short Term Goal 3 (Week 1): Patient will stand sinkside for 2 minutes for grooming with close S OT Short Term Goal 3 - Progress (Week 1): Met Week 2:  OT Short Term Goal 1 (Week 2): STG= LTG (d/t ELOS)  Skilled Therapeutic Interventions/Progress Updates:    Patient agreeable to participate in OT session. Reports *** pain level.   Patient participated in skilled OT session focusing on ***. Therapist facilitated/assessed/developed/educated/integrated/elicited *** in order to improve/facilitate/promote    Therapy Documentation Precautions:  Precautions Precautions: Fall Recall of Precautions/Restrictions: Intact Restrictions Weight Bearing Restrictions Per Provider Order: No  Therapy/Group: Individual Therapy  Carollee Circle, OTR/L,CBIS  Supplemental OT - [facility] and [facility] Secure Chat Preferred   07/12/2023, 4:52 PM

## 2023-07-12 NOTE — Progress Notes (Signed)
 Physical Therapy Session Note  Patient Details  Name: Jade Cardenas MRN: [id number] Date of Birth: [birth year]  Today's Date: 07/12/2023 PT Individual Time: 6073-7106, 2694-8546 PT Individual Time Calculation (min): 56 min, 40 min   Short Term Goals: Week 1:  PT Short Term Goal 1 (Week 1): STG=LTG due to LOS  Skilled Therapeutic Interventions/Progress Updates:      Pt supine in bed upon arrival. Pt reports feeling lousy, stomach cramps, arthritis flare, been awake since 4 am, as well as rib pain. Pt expresses concerns about participation in therapy outside of room today 2/2 frequent diarrhea. Pt reports 9/10 generalized arthritic pain, requesting pain medicine. Notified nursing. Nurse present to administer pain medication. Therapist provided rest break as needed throughout session.   Pt performed supine to sit with use of pillow to brace and supervision with HOB slightly elevated. Pt reports her bed at home has elevating feature.   Pt performed sit to stand 2x5 with supervision, with B UE pushing from surface of bed, verbal cues provided for correction of B LE pushing against surface of bed.   Pt ambulated bed<>door<>bed with RW and close supervision, verabl cues provided for recpiorocal gait. Pt reports preference for rollator versus RW. Trailed gait with rollator. Pt ambulated bed to door prior to requiring seated rest break 2/2 pt feeling as through her legs were going to give way. Verbal cues provided to position rollator in front of wall/stable surface for improved stability.   Pt ambulated 161 feet with Rollator and supervision--(while conversing with therapist) prior to requiring seated break for fatigue.   Pt denies any dizziness throughout session.   Pt supine in bed at end of session with all needs within reach and bed alarm on.   Treatment Session 2   Pt seated in WC upon arrival. Pt agreeable to therapy. Pt denies any pain.   Pt ambulated 300+ feet in total (longest  ambulation bout of 180 feet) with rollator and supervision with intermittent seated rest breaks on rollator needed for fatigued --pt demos improved stride length with verbal cues and repetition.   Pt performed ambulatory transfer to car with rollator and supervision.   Pt ambulated bedroom door to recliner with no AD and L HHA, verbal cues provided for not furniture walking.   Pt seated in recliner with all needs within reach and chair alarm on.      Therapy Documentation Precautions:  Precautions Precautions: Fall Recall of Precautions/Restrictions: Intact Restrictions Weight Bearing Restrictions Per Provider Order: No   Therapy/Group: Individual Therapy  [facility], , DPT  07/12/2023, 8:03 AM

## 2023-07-12 NOTE — Telephone Encounter (Signed)
 Patient Product/process development scientist completed.    The patient is insured through [employer]. Patient has Medicare and is not eligible for a copay card, but may be able to apply for patient assistance or Medicare RX Payment Plan (Patient Must reach out to their plan, if eligible for payment plan), if available.    Ran test claim for Eliquis 5 mg and the current 30 day co-pay is $568.58 due to a $590.00 deductible.  Ran test claim for Xarelto 20 mg and the current 30 day co-pay is $560.86 due to a $590.00 deductible.  This test claim was processed through [facility]- copay amounts may vary at other pharmacies due to pharmacy/plan contracts, or as the patient moves through the different stages of their insurance plan.     Roland Earl, CPHT Pharmacy Technician III Certified Patient Advocate [facility] Patient Advocate Team Direct Number: [phone]  Fax: [phone]

## 2023-07-12 NOTE — Progress Notes (Signed)
 PROGRESS NOTE   Subjective/Complaints: Arthritis has been more painful due to the bad weather.  Blood pressure doing better with losartan ..  She continues to have large daily bowel movements, she feels like this tires her out.  She continues to get Lovenox injections in her arms.  She was started on Pepcid in addition to Protonix .   ROS:    ROS: Patient denies fever, new vision changes, dizziness, nausea, vomiting, diarrhea,  shortness of breath or chest pain, headache, or mood change.   + chronic joint pain - wants more tylenol - explained why cannot + Large daily bowel movements  Objective:   No results found.  Recent Labs    07/11/23 0915 07/12/23 0554  WBC  --  11.0*  HGB 12.0 11.9*  HCT 36.3 35.9*  PLT  --  660*   Recent Labs    07/12/23 0554  NA 138  K 3.5  CL 104  CO2 25  GLUCOSE 118*  BUN 16  CREATININE 0.77  CALCIUM  8.6*     Intake/Output Summary (Last 24 hours) at 07/12/2023 1600 Last data filed at 07/12/2023 1350 Gross per 24 hour  Intake 709 ml  Output --  Net 709 ml        Physical Exam: Vital Signs Blood pressure 122/61, pulse 95, temperature 98.1 F (36.7 C), temperature source Oral, resp. rate 16, height 5\' 5"  (1.651 m), weight 89.5 kg, last menstrual period 03/02/1984, SpO2 96%.      General: No apparent distress HEENT: Head is normocephalic, atraumatic, sclera anicteric, oral mucosa pink and moist Neck: Supple without JVD or lymphadenopathy Heart: Reg rate and rhythm. No murmurs rubs or gallops. + chest wall TTP b/l Chest: CTA bilaterally without wheezes, rales, or rhonchi; no distress Abdomen: Midline incision as well as multiple smaller incisions C/D/I with staples in place. Gastrostomy tube site with crusted blood--non tender.   Extremities: No clubbing, cyanosis, or edema. Pulses are 2+ Psych: Pt's affect is appropriate. Pt is cooperative  Skin: Ecchymotic area LUE at  site of prior IV?    Neuro:   Alert and oriented x 3. Normal insight and awareness. Intact Memory. Normal language and speech. Cranial nerve exam unremarkable. MMT: BUE 4-/5 proximally to 4/5 distally. BLE 3+/5 HF, 4/5 KE and 4+/5 ADF/PF. Sensory exam normal for light touch and pain in all 4 limbs. No limb ataxia or cerebellar signs. No abnormal tone appreciated.  .     Neuro exam stable 5/12  SENSORY: Normal to touch all 4 extremities  Coordination: Normal finger to nose and heel to shin, no tremor, no dysmetria   MSK: Chronic arthritic changes in hands/fingers.     Assessment/Plan: 1. Functional deficits which require 3+ hours per day of interdisciplinary therapy in a comprehensive inpatient rehab setting. Physiatrist is providing close team supervision and 24 hour management of active medical problems listed below. Physiatrist and rehab team continue to assess barriers to discharge/monitor patient progress toward functional and medical goals  Care Tool:  Bathing    Body parts bathed by patient: Right arm, Left arm, Chest, Front perineal area, Left upper leg, Right lower leg, Face   Body parts bathed by helper: Buttocks,  Abdomen, Left lower leg, Right lower leg     Bathing assist Assist Level: Moderate Assistance - Patient 50 - 74%     Upper Body Dressing/Undressing Upper body dressing   What is the patient wearing?: Pull over shirt    Upper body assist Assist Level: Contact Guard/Touching assist    Lower Body Dressing/Undressing Lower body dressing      What is the patient wearing?: Underwear/pull up, Pants     Lower body assist Assist for lower body dressing: Maximal Assistance - Patient 25 - 49%     Toileting Toileting    Toileting assist Assist for toileting: Moderate Assistance - Patient 50 - 74%     Transfers Chair/bed transfer  Transfers assist     Chair/bed transfer assist level: Supervision/Verbal cueing      Locomotion Ambulation   Ambulation assist      Assist level: Supervision/Verbal cueing Assistive device: Rollator Max distance: 161 feet   Walk 10 feet activity   Assist  Walk 10 feet activity did not occur: Safety/medical concerns (dizziness, pain)  Assist level: Supervision/Verbal cueing Assistive device: Rollator   Walk 50 feet activity   Assist Walk 50 feet with 2 turns activity did not occur: Safety/medical concerns (dizziness, pain)  Assist level: Supervision/Verbal cueing Assistive device: Rollator    Walk 150 feet activity   Assist Walk 150 feet activity did not occur: Safety/medical concerns (dizziness, pain)  Assist level: Supervision/Verbal cueing Assistive device: Rollator    Walk 10 feet on uneven surface  activity   Assist Walk 10 feet on uneven surfaces activity did not occur: Safety/medical concerns (dizziness, pain)         Wheelchair     Assist Is the patient using a wheelchair?: Yes Type of Wheelchair: Manual    Wheelchair assist level: Dependent - Patient 0% Max wheelchair distance: 140ft    Wheelchair 50 feet with 2 turns activity    Assist        Assist Level: Dependent - Patient 0%   Wheelchair 150 feet activity     Assist      Assist Level: Dependent - Patient 0%   Blood pressure 122/61, pulse 95, temperature 98.1 F (36.7 C), temperature source Oral, resp. rate 16, height 5\' 5"  (1.651 m), weight 89.5 kg, last menstrual period 03/02/1984, SpO2 96%.   Medical Problem List and Plan: 1. Functional deficits secondary to debility after hiatal hernia repair/fundoplication/splenic artery repair and subsequent complications             -patient may shower             -ELOS/Goals: 10-12 days, supervision goals with PT and OT  -Con't CIR   2.  Antithrombotics: -DVT/anticoagulation:  LLE DVTs- multiple- got OK for Tx dose lovenox since labs delayed on getting Heparin  levels- wil do 90 mg SQ on thighs or  triceps BID- and stop Heparin  gtt- and check labs daily 5/11- Hb 12- doing in triceps which is Ok'd by Pharmacy-               -antiplatelet therapy: N/A 3. Pain Management: Oxycodone  prn severe pain and ultram  prn moderate pain             --decrease tylenol  to 650 mg qid.   -Continue robaxin3  5/11- D/c'd tramadol - since doesn't help her.  4. Mood/Behavior/Sleep: LCSW to follow for evaluation and support.             --team to provide encouragement/ego support.              -  antipsychotic agents: N/A  -5/7 patient would like to use as diphenhydramine  as needed for sleep, will DC trazodone  for now.  She uses Tylenol  PM at home which is Tylenol  and diphenhydramine   5/11- sleeping better 5. Neuropsych/cognition: This patient is capable of making decisions on her own behalf. 6. Skin/Wound Care: Routine pressure relief measures. Monitor incisions for healing. No dressings needed 7. Fluids/Electrolytes/Nutrition: Monitor I/O. Change breeze to ensure to avoid GERD             --friends to bring in food/supplements from home             --will continue to monitor for hypoglycemic episode.   5/7 will DC CBG in am, continue lunch,dinner and HS  -DC CBG  -5/9 Adjust protonix  to before meals as more effective taken in this manner  5/11- Protonix  changed to 6:30 and 4pm- added Pepcid 40 mg at 11am per pt request  5/12 Pepcid was started yesterday, try Carafate, could consider GI consult if not improved 8. Acute on chronic anemia: Has had rise in H/H from 6.1-->13.4 question accurate. Transfused with 4 units post op. --Will recheck in am  --was getting Iron infusions by Dr. Salomon Cree.  Her hematologist was notified about her admission  5/7 HGB stable 11.6 5/10- labs in AM/ and daily for 3 days-  5/12-stabe hemoglobin 11.9 9. Mediastinal hematoma: Had episode of hematemesis vs hemoptysis last night (isolated) --Will check CXR for follow up.  **CXR reviewed, ?CHF, has EF of 65%, chest clear on exam              -follow weights. Monitor clinically for now             -she's already a little dry by BUN             -re check labs in AM -5/7 will give lasix , 40mg  today and 20mg  daily, and potassium supplement. BUN/Cr stable-continue to monitor. Continue Delsym  for cough prn -5/8 Will order echo- last one I see is from 2008?   5/9 check echocardiogram today, discussed with patient 5/10- pt's family was asking about ECHO- went over results again with pt on 2nd visit to speak with her.  10. Chest wall pain: Likely due to CPR. Continue to encourage deep breathing/cough every hour  -5/8 lidocaine  patches ordered x2- providing mild benefit  11. Persistent leucocytosis: Monitor for fevers and signs of infection             --WBC has trended in 14-18 range.              -afebrile at present  -5/8 WBC down to 12.8  -5/12 improved to 11 Episode of Hematemesis/Nocturnal cough: Continues to have chest pain 12. Constipation: Has not had BM since last week?Aaron Aas Moderate stool noted in colon on X rays.  and relays abdominal fullness.               --will add senna to colace              -does realize that her po intake needs to pick up as well  -5/8 multiple BMs over the past 24 hours, likely had buildup of stool over the past several days, continue current regimen for now  5/9 DC senokot, lactaid  5/10- still having multiple loose stools- 3-4x/ yesterday- will monitor with liquid diet she' wants due to swallowing issues  5/11- started lactid TID with meals for pt  5/13 having large daily bowel movements 13.  Urinary retention:  Has voided twice since foley came out yesterday. Needs to drink more             --will monitor voiding with bladder scans.  -5/8 continent PVR 0 today  -5/9 remains continent, monitor  14. Pre-renal azotemia: Encourage fluid/po intake.   5/8 BUN and creatinine stable at 19/0.74 15. RA/Psoriatic arthritis: Reports diffuse pain for past month (due to need to hold meds pre-op)   --Has  been cleared to resume Methotrexate  (today) and Simponi  (friend to bring in tomorrow)  16. LLE DVT's- multiple-  5/10- changed to SQ tx dose lovenox 90 mg BID- to be given on triceps or thighs- not abdomen per pt  request- since has incision and PEG.  5/12 Consider transition to DOAC tomorrow  17. Hx of HTN  5/11- restarted Home Losartan  25 mg daily- not sure of home dose, but this appears like will handle BP elevation  5/12 BP stable/improved, continue current regimen and monitor       07/12/2023    5:00 AM 07/12/2023    4:49 AM 07/11/2023    8:22 PM  Vitals with BMI  Weight 197 lbs 5 oz    BMI 32.83    Systolic  122 142  Diastolic  61 49  Pulse  95 92        LOS: 6 days A FACE TO FACE EVALUATION WAS PERFORMED  Jade Cardenas 07/12/2023, 4:00 PM

## 2023-07-13 ENCOUNTER — Encounter: Admitting: Family Medicine

## 2023-07-13 ENCOUNTER — Telehealth: Payer: Self-pay | Admitting: Rheumatology

## 2023-07-13 LAB — HEMOGLOBIN AND HEMATOCRIT, BLOOD
HCT: 35.1 % — ABNORMAL LOW (ref 36.0–46.0)
Hemoglobin: 11.4 g/dL — ABNORMAL LOW (ref 12.0–15.0)

## 2023-07-13 MED ORDER — TRAZODONE HCL 50 MG PO TABS
50.0000 mg | ORAL_TABLET | Freq: Every day | ORAL | Status: DC
  Administered 2023-07-13 – 2023-07-14 (×2): 50 mg via ORAL
  Filled 2023-07-13 (×2): qty 1

## 2023-07-13 MED ORDER — LOSARTAN POTASSIUM 25 MG PO TABS
12.5000 mg | ORAL_TABLET | Freq: Every day | ORAL | Status: DC
  Administered 2023-07-14 – 2023-07-15 (×2): 12.5 mg via ORAL
  Filled 2023-07-13 (×2): qty 1

## 2023-07-13 MED ORDER — DIPHENHYDRAMINE HCL 25 MG PO CAPS: 25.0000 mg | ORAL_CAPSULE | Freq: Four times a day (QID) | ORAL | Status: DC | PRN

## 2023-07-13 MED ORDER — METHOTREXATE SODIUM CHEMO INJECTION 250 MG/10ML
7.5000 mg | INTRAMUSCULAR | Status: DC
  Filled 2023-07-13: qty 0.3

## 2023-07-13 NOTE — Progress Notes (Addendum)
 PROGRESS NOTE   Subjective/Complaints: Pt feels better today, still feels like she has too many medications. Seen by surgery today, staples removed. Ok to shower per surgery. Looking forward to DC.  LBM today  ROS: Patient denies fever, new vision changes, dizziness, nausea, vomiting, diarrhea,  shortness of breath or chest pain, headache, or mood change.  + chronic joint pain  + Large daily bowel movements +dysphagia chronic  Objective:   No results found.  Recent Labs    07/12/23 0554 07/13/23 0540  WBC 11.0*  --   HGB 11.9* 11.4*  HCT 35.9* 35.1*  PLT 660*  --    Recent Labs    07/12/23 0554  NA 138  K 3.5  CL 104  CO2 25  GLUCOSE 118*  BUN 16  CREATININE 0.77  CALCIUM  8.6*     Intake/Output Summary (Last 24 hours) at 07/13/2023 1420 Last data filed at 07/13/2023 1348 Gross per 24 hour  Intake 676 ml  Output 0 ml  Net 676 ml        Physical Exam: Vital Signs Blood pressure (!) 127/59, pulse 94, temperature 97.6 F (36.4 C), resp. rate 16, height 5\' 5"  (1.651 m), weight 97.3 kg, last menstrual period 03/02/1984, SpO2 100%.      General: No apparent distress, laying in bed,appears comfortable HEENT: Head is normocephalic, atraumatic, sclera anicteric, oral mucosa pink and moist Neck: Supple without JVD or lymphadenopathy Heart: Reg rate and rhythm. No murmurs rubs or gallops. + chest wall TTP b/l Chest: CTA bilaterally without wheezes, rales, or rhonchi; no distress Abdomen: Midline incision as well as multiple smaller incisions C/D/I with staples have been removed Gastrostomy tube site with crusted blood--non tender.   Extremities: No clubbing, cyanosis, or edema. Pulses are 2+ Psych: Pt's affect is appropriate. Pt is cooperative  Skin: Ecchymotic area LUE at site of prior IV?    Neuro:   Alert and oriented x 3. Normal insight and awareness. Intact Memory. Normal language and speech. Cranial  nerve exam unremarkable. MMT: BUE 4-/5 proximally to 4/5 distally. BLE 3+/5 HF, 4/5 KE and 4+/5 ADF/PF. Sensory exam normal for light touch and pain in all 4 limbs. No limb ataxia or cerebellar signs. No abnormal tone appreciated.  .     Neuro exam stable 5/14  SENSORY: Normal to touch all 4 extremities  Coordination: Normal finger to nose and heel to shin, no tremor, no dysmetria   MSK: Chronic arthritic changes in hands/fingers.     Assessment/Plan: 1. Functional deficits which require 3+ hours per day of interdisciplinary therapy in a comprehensive inpatient rehab setting. Physiatrist is providing close team supervision and 24 hour management of active medical problems listed below. Physiatrist and rehab team continue to assess barriers to discharge/monitor patient progress toward functional and medical goals  Care Tool:  Bathing    Body parts bathed by patient: Right arm, Left arm, Chest, Front perineal area, Left upper leg, Right lower leg, Face   Body parts bathed by helper: Buttocks, Abdomen, Left lower leg, Right lower leg     Bathing assist Assist Level: Moderate Assistance - Patient 50 - 74%     Upper Body Dressing/Undressing  Upper body dressing   What is the patient wearing?: Pull over shirt    Upper body assist Assist Level: Contact Guard/Touching assist    Lower Body Dressing/Undressing Lower body dressing      What is the patient wearing?: Underwear/pull up, Pants     Lower body assist Assist for lower body dressing: Maximal Assistance - Patient 25 - 49%     Toileting Toileting    Toileting assist Assist for toileting: Moderate Assistance - Patient 50 - 74%     Transfers Chair/bed transfer  Transfers assist     Chair/bed transfer assist level: Supervision/Verbal cueing     Locomotion Ambulation   Ambulation assist      Assist level: Supervision/Verbal cueing Assistive device: Rollator Max distance: 161 feet   Walk 10 feet  activity   Assist  Walk 10 feet activity did not occur: Safety/medical concerns (dizziness, pain)  Assist level: Supervision/Verbal cueing Assistive device: Rollator   Walk 50 feet activity   Assist Walk 50 feet with 2 turns activity did not occur: Safety/medical concerns (dizziness, pain)  Assist level: Supervision/Verbal cueing Assistive device: Rollator    Walk 150 feet activity   Assist Walk 150 feet activity did not occur: Safety/medical concerns (dizziness, pain)  Assist level: Supervision/Verbal cueing Assistive device: Rollator    Walk 10 feet on uneven surface  activity   Assist Walk 10 feet on uneven surfaces activity did not occur: Safety/medical concerns (dizziness, pain)         Wheelchair     Assist Is the patient using a wheelchair?: Yes Type of Wheelchair: Manual    Wheelchair assist level: Dependent - Patient 0% Max wheelchair distance: 161ft    Wheelchair 50 feet with 2 turns activity    Assist        Assist Level: Dependent - Patient 0%   Wheelchair 150 feet activity     Assist      Assist Level: Dependent - Patient 0%   Blood pressure (!) 127/59, pulse 94, temperature 97.6 F (36.4 C), resp. rate 16, height 5\' 5"  (1.651 m), weight 97.3 kg, last menstrual period 03/02/1984, SpO2 100%.   Medical Problem List and Plan: 1. Functional deficits secondary to debility after hiatal hernia repair/fundoplication/splenic artery repair and subsequent complications             -patient may shower, Ok get incision wet             -ELOS/Goals: 10-12 days, supervision goals with PT and OT  -Con't CIR  -Team conference today please see physician documentation under team conference tab, met with team  to discuss problems,progress, and goals. Formulized individual treatment plan based on medical history, underlying problem and comorbidities.     2.  Antithrombotics: -DVT/anticoagulation:  LLE DVTs- multiple- got OK for Tx dose  lovenox since labs delayed on getting Heparin  levels- wil do 90 mg SQ on thighs or triceps BID- and stop Heparin  gtt- and check labs daily 5/11- Hb 12- doing in triceps which is Ok'd by Pharmacy-               -antiplatelet therapy: N/A 3. Pain Management: Oxycodone  prn severe pain and ultram  prn moderate pain             --decrease tylenol  to 650 mg qid.   -Continue robaxin3  5/11- D/c'd tramadol - since doesn't help her.  4. Mood/Behavior/Sleep: LCSW to follow for evaluation and support.             --  team to provide encouragement/ego support.              -antipsychotic agents: N/A  -5/7 patient would like to use as diphenhydramine  as needed for sleep, will DC trazodone  for now.  She uses Tylenol  PM at home which is Tylenol  and diphenhydramine   5/13 start trazodone  at night 50 mg for insomnia  5/14 continue trazodone  current dose 5. Neuropsych/cognition: This patient is capable of making decisions on her own behalf. 6. Skin/Wound Care: Routine pressure relief measures. Monitor incisions for healing. No dressings needed 7. Fluids/Electrolytes/Nutrition: Monitor I/O. Change breeze to ensure to avoid GERD             --friends to bring in food/supplements from home             --will continue to monitor for hypoglycemic episode.   5/7 will DC CBG in am, continue lunch,dinner and HS  -DC CBG  -5/9 Adjust protonix  to before meals as more effective taken in this manner  5/11- Protonix  changed to 6:30 and 4pm- added Pepcid 40 mg at 11am per pt request  5/12 Pepcid was started yesterday, try Carafate, could consider GI consult if not improved  5/13-14 will contact GI regarding her GERD symptoms-- advised to check with surgery team- GERD symptoms expected, upright after eating, consider tums 8. Acute on chronic anemia: Has had rise in H/H from 6.1-->13.4 question accurate. Transfused with 4 units post op. --Will recheck in am  --was getting Iron infusions by Dr. Salomon Cree.  Her hematologist was  notified about her admission  5/7 HGB stable 11.6 5/10- labs in AM/ and daily for 3 days-  5/12-stabe hemoglobin 11.9 5/13 HGB a little lower at 11.  recheck pending 9. Mediastinal hematoma: Had episode of hematemesis vs hemoptysis last night (isolated) --Will check CXR for follow up.  **CXR reviewed, ?CHF, has EF of 65%, chest clear on exam             -follow weights. Monitor clinically for now             -she's already a little dry by BUN             -re check labs in AM -5/7 will give lasix , 40mg  today and 20mg  daily, and potassium supplement. BUN/Cr stable-continue to monitor. Continue Delsym  for cough prn -5/8 Will order echo- last one I see is from 2008?   5/9 check echocardiogram today, discussed with patient 5/10- pt's family was asking about ECHO- went over results again with pt on 2nd visit to speak with her.  Lasix  completed, K+ supplement complete 10. Chest wall pain: Likely due to CPR. Continue to encourage deep breathing/cough every hour  -5/8 lidocaine  patches ordered x2- providing mild benefit  11. Persistent leucocytosis: Monitor for fevers and signs of infection             --WBC has trended in 14-18 range.              -afebrile at present  -5/8 WBC down to 12.8  -5/12 improved to 11  Recheck  Episode of Hematemesis/Nocturnal cough: Continues to have chest pain 12. Constipation: Has not had BM since last week?Aaron Aas Moderate stool noted in colon on X rays.  and relays abdominal fullness.               --will add senna to colace              -does realize that her po intake needs  to pick up as well  -5/8 multiple BMs over the past 24 hours, likely had buildup of stool over the past several days, continue current regimen for now  5/9 DC senokot, lactaid  5/10- still having multiple loose stools- 3-4x/ yesterday- will monitor with liquid diet she' wants due to swallowing issues  5/11- started lactid TID with meals for pt  5/12 having large daily bowel movements  LBM  5/14, continue to monitor 13. Urinary retention:  Has voided twice since foley came out yesterday. Needs to drink more             --will monitor voiding with bladder scans.  -5/8 continent PVR 0 today  -5/9 remains continent, monitor  14. Pre-renal azotemia: Encourage fluid/po intake.   5/8 BUN and creatinine stable at 19/0.74 15. RA/Psoriatic arthritis: Reports diffuse pain for past month (due to need to hold meds pre-op)   --Has been cleared to resume Methotrexate  (today) and Simponi  (friend to bring in tomorrow)  16. LLE DVT's- multiple-  5/10- changed to SQ tx dose lovenox 90 mg BID- to be given on triceps or thighs- not abdomen per pt  request- since has incision and PEG.  5/13 continue Lovenox, consider transition to DOAC, monitor CBG 5/14 change to Abixaban, pharm consult 17. Hx of HTN/Orthostatic hypotension  5/11- restarted Home Losartan  25 mg daily- not sure of home dose, but this appears like will handle BP elevation  5/12 BP stable/improved, continue current regimen and monitor   5/13 BP a little soft, questionable symptoms of orthostatic hypotension with standing.  Decrease losartan  to 12.5mg  twice daily  5/14 fair control, continue current regimen and monitor       07/14/2023    3:24 AM 07/13/2023    7:39 PM 07/13/2023    1:46 PM  Vitals with BMI  Weight 214 lbs 8 oz    BMI 35.7    Systolic 148 149 161  Diastolic 54 60 59  Pulse 81 88 94        LOS: 7 days A FACE TO FACE EVALUATION WAS PERFORMED  Jade Cardenas 07/13/2023, 2:20 PM

## 2023-07-13 NOTE — Progress Notes (Signed)
 Medications reviewed this morning with patient at bedside.   Randeen Busman, LPN

## 2023-07-13 NOTE — Telephone Encounter (Signed)
 FYI:  Patient called to cancel her follow-up appointment.  Patient states she was told it will take 3-4 months for her to recover from complications from her Hernia surgery.   Patient states she is in rehab now and they just restarted her Methotrexate  medication.  Patient states she also got special dispensation to bring in her Simponi  medication.

## 2023-07-13 NOTE — Progress Notes (Signed)
 Physical Therapy Session Note  Patient Details  Name: Jade Cardenas MRN: [id number] Date of Birth: May 18, 1950  Today's Date: 07/13/2023 PT Individual Time: 2440-1027, 2536-6440 PT Individual Time Calculation (min): 58 min, 70   Short Term Goals: Week 1:  PT Short Term Goal 1 (Week 1): STG=LTG due to LOS  Skilled Therapeutic Interventions/Progress Updates:      Treatment Session 1  Pt supine in bed upon arrival. Pt agreeable to therapy. Pt denies any pain.   Pt performed supine to sit from flat bed with mod I.   Pt requesting to use bathroom. Pt performed stand pivot transfer to [facility] with use of rollator and supervision. Pt continent of bladder. Pt performed pericare/donning doffing pants while standing with rollator and supervision.   Pt reports lightheadedness/whooziness in standing, therapist assessed vitals while seated EOB--pt reports symptoms subsided while seated EOB:   R brachial: BP 148/131 HR 103  L brachial: BP 124/42 HR 85   Notified nursing.   Pt seated EOB-performed the following exercises for hemodynamic stability/activity tolerance/endurance   1x10 LAQ B  1x20 heel raises   1x20 toe raises  1x10 seated alternating marching B  Reasessed vitals sitting EOB post seated therex with nurse present:   R brachial: BP 126/62 HR 90  L brachial BP 116/65 HR 91   Nurse present to administer medications.   Pt ambulated room to west elevators with 2 seated rest breaks for fatigue, and light headedness on 2nd bout.   Pt transported dependnet in WC back to room.   Pt seated in recliner at end of session with all needs within reach and bed alarm on.   Treatment Session 2   Pt asleep in bed upon arrival. Pt agreeable to therapy. Pt denies any pain.   Pt transported dependent in [facility] to main gym for time/energy conservation.   Pt ascended/descended curb step x3 with RW and CGA progressing to supervision, verbal cues provided for technique/sequencing.   Pt attempted  ambulation with use of SPC however pt self discontinued 2/2 FOF. Pt ambulated 2x63, 1x96  feet with L HHA, progressing to no UE support and CGA, verbal cues provided for reciprocal step length. Pt ambulated with SPC and CGA progressing to close supervision prior to needing to seated rest break 2/2 SOB and fatigue.   Pt supine in bed at end of session with all needs within reach and bed alarm on.                  Therapy Documentation Precautions:  Precautions Precautions: Fall Recall of Precautions/Restrictions: Intact Restrictions Weight Bearing Restrictions Per Provider Order: No   Therapy/Group: Individual Therapy  [facility] [place], [place], DPT  07/13/2023, 8:05 AM

## 2023-07-13 NOTE — Progress Notes (Signed)
 Occupational Therapy Session Note  Patient Details  Name: Jade Cardenas MRN: [id number] Date of Birth: May 10, 1950  {CHL IP REHAB OT TIME CALCULATIONS:[id number]}   Short Term Goals: Week 2:  OT Short Term Goal 1 (Week 2): STG= LTG (d/t ELOS)  Skilled Therapeutic Interventions/Progress Updates:    Patient agreeable to participate in OT session. Reports *** pain level.   Patient participated in skilled OT session focusing on ***. Therapist facilitated/assessed/developed/educated/integrated/elicited *** in order to improve/facilitate/promote    Therapy Documentation Precautions:  Precautions Precautions: Fall Recall of Precautions/Restrictions: Intact Restrictions Weight Bearing Restrictions Per Provider Order: No   Therapy/Group: Individual Therapy  Carollee Circle, OTR/L,CBIS  Supplemental OT - [facility] and [facility] Secure Chat Preferred   07/13/2023, 4:21 PM

## 2023-07-13 NOTE — Plan of Care (Signed)
  Problem: Consults Goal: RH GENERAL PATIENT EDUCATION Description: See Patient Education module for education specifics. Outcome: Progressing   Problem: RH BOWEL ELIMINATION Goal: RH STG MANAGE BOWEL WITH ASSISTANCE Description: STG Manage Bowel with mod I Assistance. Outcome: Progressing Goal: RH STG MANAGE BOWEL W/MEDICATION W/ASSISTANCE Description: STG Manage Bowel with Medication with mod I Assistance. Outcome: Progressing   Problem: RH BLADDER ELIMINATION Goal: RH STG MANAGE BLADDER WITH ASSISTANCE Description: STG Manage Bladder With mod I Assistance Outcome: Progressing Goal: RH STG MANAGE BLADDER WITH MEDICATION WITH ASSISTANCE Description: STG Manage Bladder With Medication With mod I Assistance. Outcome: Progressing   Problem: RH SAFETY Goal: RH STG ADHERE TO SAFETY PRECAUTIONS W/ASSISTANCE/DEVICE Description: STG Adhere to Safety Precautions With cues Assistance/Device. Outcome: Progressing   Problem: RH BLADDER ELIMINATION Goal: RH STG MANAGE BLADDER WITH ASSISTANCE Description: STG Manage Bladder With mod I  Assistance Outcome: Progressing Goal: RH STG MANAGE BLADDER WITH MEDICATION WITH ASSISTANCE Description: STG Manage Bladder With Medication With mod I Assistance. Outcome: Progressing

## 2023-07-14 ENCOUNTER — Other Ambulatory Visit (HOSPITAL_COMMUNITY): Payer: Self-pay

## 2023-07-14 ENCOUNTER — Encounter: Payer: Self-pay | Admitting: Hematology

## 2023-07-14 LAB — CBC
HCT: 37.2 % (ref 36.0–46.0)
Hemoglobin: 11.8 g/dL — ABNORMAL LOW (ref 12.0–15.0)
MCH: 29.5 pg (ref 26.0–34.0)
MCHC: 31.7 g/dL (ref 30.0–36.0)
MCV: 93 fL (ref 80.0–100.0)
Platelets: 714 10*3/uL — ABNORMAL HIGH (ref 150–400)
RBC: 4 MIL/uL (ref 3.87–5.11)
RDW: 15.1 % (ref 11.5–15.5)
WBC: 10 10*3/uL (ref 4.0–10.5)
nRBC: 0 % (ref 0.0–0.2)

## 2023-07-14 MED ORDER — LACTASE 3000 UNITS PO TABS
6000.0000 [IU] | ORAL_TABLET | Freq: Three times a day (TID) | ORAL | 0 refills | Status: DC
  Filled 2023-07-14: qty 100, 17d supply, fill #0

## 2023-07-14 MED ORDER — BETHANECHOL CHLORIDE 10 MG PO TABS
10.0000 mg | ORAL_TABLET | Freq: Three times a day (TID) | ORAL | 0 refills | Status: DC
  Filled 2023-07-14: qty 90, 30d supply, fill #0

## 2023-07-14 MED ORDER — TRAZODONE HCL 50 MG PO TABS
50.0000 mg | ORAL_TABLET | Freq: Every day | ORAL | 0 refills | Status: DC
  Filled 2023-07-14: qty 30, 30d supply, fill #0

## 2023-07-14 MED ORDER — ACETAMINOPHEN 325 MG PO TABS: 650.0000 mg | ORAL_TABLET | Freq: Four times a day (QID) | ORAL | Status: AC

## 2023-07-14 MED ORDER — APIXABAN 5 MG PO TABS
10.0000 mg | ORAL_TABLET | Freq: Two times a day (BID) | ORAL | Status: DC
  Administered 2023-07-14 – 2023-07-15 (×2): 10 mg via ORAL
  Filled 2023-07-14 (×2): qty 2

## 2023-07-14 MED ORDER — LOPERAMIDE HCL 2 MG PO CAPS
2.0000 mg | ORAL_CAPSULE | ORAL | 0 refills | Status: AC | PRN
  Filled 2023-07-14: qty 30, 15d supply, fill #0

## 2023-07-14 MED ORDER — APIXABAN 5 MG PO TABS: 5.0000 mg | ORAL_TABLET | Freq: Two times a day (BID) | ORAL | Status: DC

## 2023-07-14 MED ORDER — LIDOCAINE 5 % EX PTCH
2.0000 | MEDICATED_PATCH | CUTANEOUS | 0 refills | Status: DC
Start: 2023-07-15 — End: 2023-08-10
  Filled 2023-07-14: qty 60, 30d supply, fill #0

## 2023-07-14 MED ORDER — METHOCARBAMOL 500 MG PO TABS
500.0000 mg | ORAL_TABLET | Freq: Four times a day (QID) | ORAL | 0 refills | Status: DC
  Filled 2023-07-14: qty 75, 19d supply, fill #0

## 2023-07-14 MED ORDER — SUCRALFATE 1 G PO TABS
1.0000 g | ORAL_TABLET | Freq: Three times a day (TID) | ORAL | 0 refills | Status: DC
  Filled 2023-07-14: qty 60, 20d supply, fill #0

## 2023-07-14 MED ORDER — LOSARTAN POTASSIUM 25 MG PO TABS
12.5000 mg | ORAL_TABLET | Freq: Every day | ORAL | 0 refills | Status: DC
  Filled 2023-07-14: qty 15, 30d supply, fill #0

## 2023-07-14 MED ORDER — FAMOTIDINE 40 MG PO TABS
40.0000 mg | ORAL_TABLET | Freq: Every day | ORAL | 0 refills | Status: AC
  Filled 2023-07-14: qty 30, 30d supply, fill #0

## 2023-07-14 MED ORDER — APIXABAN 5 MG PO TABS
ORAL_TABLET | ORAL | 0 refills | Status: DC
  Filled 2023-07-14: qty 64, 30d supply, fill #0

## 2023-07-14 NOTE — Progress Notes (Signed)
 PROGRESS NOTE   Subjective/Complaints: Pt feels better today, still feels like she has too many medications. Seen by surgery today, staples removed. Ok to shower per surgery. Looking forward to DC.  LBM today  ROS: Patient denies fever, new vision changes, dizziness, nausea, vomiting, diarrhea,  shortness of breath or chest pain, headache, or mood change.  + chronic joint pain  + Large daily bowel movements +dysphagia chronic  Objective:   No results found.  Recent Labs    07/12/23 0554 07/13/23 0540 07/14/23 1331  WBC 11.0*  --  10.0  HGB 11.9* 11.4* 11.8*  HCT 35.9* 35.1* 37.2  PLT 660*  --  714*   Recent Labs    07/12/23 0554  NA 138  K 3.5  CL 104  CO2 25  GLUCOSE 118*  BUN 16  CREATININE 0.77  CALCIUM  8.6*     Intake/Output Summary (Last 24 hours) at 07/14/2023 1454 Last data filed at 07/14/2023 1330 Gross per 24 hour  Intake 596 ml  Output --  Net 596 ml        Physical Exam: Vital Signs Blood pressure (!) 129/49, pulse 79, temperature 98 F (36.7 C), resp. rate 16, height 5\' 5"  (1.651 m), weight 97.3 kg, last menstrual period 03/02/1984, SpO2 100%.      General: No apparent distress, laying in bed,appears comfortable HEENT: Head is normocephalic, atraumatic, sclera anicteric, oral mucosa pink and moist Neck: Supple without JVD or lymphadenopathy Heart: Reg rate and rhythm. No murmurs rubs or gallops. + chest wall TTP b/l Chest: CTA bilaterally without wheezes, rales, or rhonchi; no distress Abdomen: Midline incision as well as multiple smaller incisions C/D/I with staples have been removed Gastrostomy tube site with crusted blood--non tender.   Extremities: No clubbing, cyanosis, or edema. Pulses are 2+ Psych: Pt's affect is appropriate. Pt is cooperative  Skin: Ecchymotic area LUE at site of prior IV?    Neuro:   Alert and oriented x 3. Normal insight and awareness. Intact Memory.  Normal language and speech. Cranial nerve exam unremarkable. MMT: BUE 4-/5 proximally to 4/5 distally. BLE 3+/5 HF, 4/5 KE and 4+/5 ADF/PF. Sensory exam normal for light touch and pain in all 4 limbs. No limb ataxia or cerebellar signs. No abnormal tone appreciated.  .     Neuro exam stable 5/14  SENSORY: Normal to touch all 4 extremities  Coordination: Normal finger to nose and heel to shin, no tremor, no dysmetria   MSK: Chronic arthritic changes in hands/fingers.     Assessment/Plan: 1. Functional deficits which require 3+ hours per day of interdisciplinary therapy in a comprehensive inpatient rehab setting. Physiatrist is providing close team supervision and 24 hour management of active medical problems listed below. Physiatrist and rehab team continue to assess barriers to discharge/monitor patient progress toward functional and medical goals  Care Tool:  Bathing    Body parts bathed by patient: Right arm, Left arm, Chest, Front perineal area, Left upper leg, Right lower leg, Face   Body parts bathed by helper: Buttocks, Abdomen, Left lower leg, Right lower leg     Bathing assist Assist Level: Moderate Assistance - Patient 50 - 74%  Upper Body Dressing/Undressing Upper body dressing   What is the patient wearing?: Pull over shirt    Upper body assist Assist Level: Contact Guard/Touching assist    Lower Body Dressing/Undressing Lower body dressing      What is the patient wearing?: Underwear/pull up, Pants     Lower body assist Assist for lower body dressing: Maximal Assistance - Patient 25 - 49%     Toileting Toileting    Toileting assist Assist for toileting: Supervision/Verbal cueing     Transfers Chair/bed transfer  Transfers assist     Chair/bed transfer assist level: Supervision/Verbal cueing     Locomotion Ambulation   Ambulation assist      Assist level: Supervision/Verbal cueing Assistive device: Rollator Max distance: 180    Walk 10 feet activity   Assist  Walk 10 feet activity did not occur: Safety/medical concerns (dizziness, pain)  Assist level: Supervision/Verbal cueing Assistive device: Cane-straight   Walk 50 feet activity   Assist Walk 50 feet with 2 turns activity did not occur: Safety/medical concerns (dizziness, pain)  Assist level: Supervision/Verbal cueing Assistive device: Cane-straight    Walk 150 feet activity   Assist Walk 150 feet activity did not occur: Safety/medical concerns (dizziness, pain)  Assist level: Supervision/Verbal cueing Assistive device: Rollator    Walk 10 feet on uneven surface  activity   Assist Walk 10 feet on uneven surfaces activity did not occur: Safety/medical concerns (dizziness, pain)         Wheelchair     Assist Is the patient using a wheelchair?: Yes Type of Wheelchair: Manual    Wheelchair assist level: Dependent - Patient 0% Max wheelchair distance: 150ft    Wheelchair 50 feet with 2 turns activity    Assist        Assist Level: Dependent - Patient 0%   Wheelchair 150 feet activity     Assist      Assist Level: Dependent - Patient 0%   Blood pressure (!) 129/49, pulse 79, temperature 98 F (36.7 C), resp. rate 16, height 5\' 5"  (1.651 m), weight 97.3 kg, last menstrual period 03/02/1984, SpO2 100%.   Medical Problem List and Plan: 1. Functional deficits secondary to debility after hiatal hernia repair/fundoplication/splenic artery repair and subsequent complications             -patient may shower, Ok get incision wet             -ELOS/Goals: 10-12 days, supervision goals with PT and OT  -Con't CIR  -Team conference today please see physician documentation under team conference tab, met with team  to discuss problems,progress, and goals. Formulized individual treatment plan based on medical history, underlying problem and comorbidities.     2.  Antithrombotics: -DVT/anticoagulation:  LLE DVTs- multiple-  got OK for Tx dose lovenox since labs delayed on getting Heparin  levels- wil do 90 mg SQ on thighs or triceps BID- and stop Heparin  gtt- and check labs daily 5/11- Hb 12- doing in triceps which is Ok'd by Pharmacy-               -antiplatelet therapy: N/A 3. Pain Management: Oxycodone  prn severe pain and ultram  prn moderate pain             --decrease tylenol  to 650 mg qid.   -Continue robaxin3  5/11- D/c'd tramadol - since doesn't help her.  4. Mood/Behavior/Sleep: LCSW to follow for evaluation and support.             --team  to provide encouragement/ego support.              -antipsychotic agents: N/A  -5/7 patient would like to use as diphenhydramine  as needed for sleep, will DC trazodone  for now.  She uses Tylenol  PM at home which is Tylenol  and diphenhydramine   5/13 start trazodone  at night 50 mg for insomnia  5/14 continue trazodone  current dose 5. Neuropsych/cognition: This patient is capable of making decisions on her own behalf. 6. Skin/Wound Care: Routine pressure relief measures. Monitor incisions for healing. No dressings needed 7. Fluids/Electrolytes/Nutrition: Monitor I/O. Change breeze to ensure to avoid GERD             --friends to bring in food/supplements from home             --will continue to monitor for hypoglycemic episode.   5/7 will DC CBG in am, continue lunch,dinner and HS  -DC CBG  -5/9 Adjust protonix  to before meals as more effective taken in this manner  5/11- Protonix  changed to 6:30 and 4pm- added Pepcid 40 mg at 11am per pt request  5/12 Pepcid was started yesterday, try Carafate, could consider GI consult if not improved  5/13-14 will contact GI regarding her GERD symptoms-- advised to check with surgery team- GERD symptoms expected, upright after eating, consider tums 8. Acute on chronic anemia: Has had rise in H/H from 6.1-->13.4 question accurate. Transfused with 4 units post op. --Will recheck in am  --was getting Iron infusions by Dr. Salomon Cree.  Her  hematologist was notified about her admission  5/7 HGB stable 11.6 5/10- labs in AM/ and daily for 3 days-  5/12-stabe hemoglobin 11.9 5/13 HGB a little lower at 11.  recheck pending 9. Mediastinal hematoma: Had episode of hematemesis vs hemoptysis last night (isolated) --Will check CXR for follow up.  **CXR reviewed, ?CHF, has EF of 65%, chest clear on exam             -follow weights. Monitor clinically for now             -she's already a little dry by BUN             -re check labs in AM -5/7 will give lasix , 40mg  today and 20mg  daily, and potassium supplement. BUN/Cr stable-continue to monitor. Continue Delsym  for cough prn -5/8 Will order echo- last one I see is from 2008?   5/9 check echocardiogram today, discussed with patient 5/10- pt's family was asking about ECHO- went over results again with pt on 2nd visit to speak with her.  Lasix  completed, K+ supplement complete 10. Chest wall pain: Likely due to CPR. Continue to encourage deep breathing/cough every hour  -5/8 lidocaine  patches ordered x2- providing mild benefit  11. Persistent leucocytosis: Monitor for fevers and signs of infection             --WBC has trended in 14-18 range.              -afebrile at present  -5/8 WBC down to 12.8  -5/12 improved to 11  Recheck  Episode of Hematemesis/Nocturnal cough: Continues to have chest pain 12. Constipation: Has not had BM since last week?Aaron Aas Moderate stool noted in colon on X rays.  and relays abdominal fullness.               --will add senna to colace              -does realize that her po intake needs to  pick up as well  -5/8 multiple BMs over the past 24 hours, likely had buildup of stool over the past several days, continue current regimen for now  5/9 DC senokot, lactaid  5/10- still having multiple loose stools- 3-4x/ yesterday- will monitor with liquid diet she' wants due to swallowing issues  5/11- started lactid TID with meals for pt  5/12 having large daily bowel  movements  LBM 5/14, continue to monitor 13. Urinary retention:  Has voided twice since foley came out yesterday. Needs to drink more             --will monitor voiding with bladder scans.  -5/8 continent PVR 0 today  -5/9 remains continent, monitor  14. Pre-renal azotemia: Encourage fluid/po intake.   5/8 BUN and creatinine stable at 19/0.74 15. RA/Psoriatic arthritis: Reports diffuse pain for past month (due to need to hold meds pre-op)   --Has been cleared to resume Methotrexate  (today) and Simponi  (friend to bring in tomorrow)  16. LLE DVT's- multiple-  5/10- changed to SQ tx dose lovenox 90 mg BID- to be given on triceps or thighs- not abdomen per pt  request- since has incision and PEG.  5/13 continue Lovenox, consider transition to DOAC, monitor CBG 5/14 change to Abixaban, pharm consult 17. Hx of HTN/Orthostatic hypotension  5/11- restarted Home Losartan  25 mg daily- not sure of home dose, but this appears like will handle BP elevation  5/12 BP stable/improved, continue current regimen and monitor   5/13 BP a little soft, questionable symptoms of orthostatic hypotension with standing.  Decrease losartan  to 12.5mg  twice daily  5/14 fair control, continue current regimen and monitor       07/14/2023    2:22 PM 07/14/2023    3:24 AM 07/13/2023    7:39 PM  Vitals with BMI  Weight  214 lbs 8 oz   BMI  35.7   Systolic 129 148 161  Diastolic 49 54 60  Pulse 79 81 88        LOS: 8 days A FACE TO FACE EVALUATION WAS PERFORMED  Jade Cardenas 07/14/2023, 2:54 PM

## 2023-07-14 NOTE — Progress Notes (Signed)
 Inpatient Rehabilitation Care Coordinator Discharge Note   Patient Details  Name: Jade Cardenas MRN: [id number] Date of Birth: [birth year]   Discharge location: HOME WITH FRIENDS TO PROVIDE SUPERVISION AND FREQENT CHECKS  Length of Stay: 9 DAYS  Discharge activity level: MOD/I LEVEL  Home/community participation: ACTIVE  Patient response WU:[id number] Literacy - How often do you need to have someone help you when you read instructions, pamphlets, or other written material from your doctor or pharmacy?: Never  Patient response YN:WGNFAO Isolation - How often do you feel lonely or isolated from those around you?: Never  Services provided included: MD, RD, PT, OT, RN, CM, Pharmacy, SW  Financial Services:  Financial Services Utilized: Medicare    Choices offered to/list presented to: PT  Follow-up services arranged:  Home Health, Patient/Family has no preference for HH/DME agencies Home Health Agency: [facility]  PT   & OT     HAS ALL DME FROM PREVIOUS ADMISSIONS    Patient response to transportation need: Is the patient able to respond to transportation needs?: Yes In the past 12 months, has lack of transportation kept you from medical appointments or from getting medications?: No In the past 12 months, has lack of transportation kept you from meetings, work, or from getting things needed for daily living?: No   Patient/Family verbalized understanding of follow-up arrangements:  Yes  Individual responsible for coordination of the follow-up plan: SELF  Confirmed correct DME delivered: Mardell Shade 07/14/2023    Comments (or additional information):PT DID WELL AND REACHED GOALS OF MOD/I LEVEL  Summary of Stay    Date/Time Discharge Planning CSW  07/14/23 1308 Home with friends who will assist -very good friends support. Should be ready for DC this week RGD  07/07/23 0944 HOme with friends who will assist and pt plans to be mod/i at DC. Hopes for short  length of stay RGD       [name]

## 2023-07-14 NOTE — Progress Notes (Signed)
 Physical Therapy Discharge Summary  Patient Details  Name: Jade Cardenas MRN: [id number] Date of Birth: 11-06-1950  Date of Discharge from PT service:Jul 14, 2023  Today's Date: 07/14/2023 PT Individual Time: 0915-1014 PT Individual Time Calculation (min): 59 min    Patient has met 8 of 8 long term goals due to improved activity tolerance, improved balance, increased strength, decreased pain, improved awareness, and improved coordination.  Patient to discharge at an ambulatory level Modified Independent with use of rollator.   Patient's care partner is independent to provide the necessary physical assistance at discharge.   Recommendation:  Patient will benefit from ongoing skilled PT services in home health setting to continue to advance safe functional mobility, address ongoing impairments in endurance, balance, gait, and minimize fall risk.  Equipment: Pt has rollator, RW and SPC at home  Reasons for discharge: treatment goals met and discharge from hospital  Patient/family agrees with progress made and goals achieved: Yes  PT Discharge Precautions/Restrictions Precautions Precautions: Fall Recall of Precautions/Restrictions: Intact Precaution/Restrictions Comments: G-tube, abdominal incision Restrictions Weight Bearing Restrictions Per Provider Order: No Pain Interference Pain Interference Pain Effect on Sleep: 4. Almost constantly Pain Interference with Therapy Activities: 2. Occasionally Pain Interference with Day-to-Day Activities: 2. Occasionally Vision/Perception  Vision - History Ability to See in Adequate Light: 0 Adequate Perception Perception: Within Functional Limits Praxis Praxis: WFL  Cognition Overall Cognitive Status: Within Functional Limits for tasks assessed Arousal/Alertness: Awake/alert Orientation Level: Oriented X4 Memory: Appears intact Awareness: Appears intact Problem Solving: Appears intact Safety/Judgment: Appears  intact Sensation Sensation Light Touch: Impaired by gross assessment Hot/Cold: Appears Intact Proprioception: Appears Intact Stereognosis: Not tested Additional Comments: baseline neuropathy B, R LE>L LE Coordination Gross Motor Movements are Fluid and Coordinated: Yes Fine Motor Movements are Fluid and Coordinated: Yes Coordination and Movement Description: global weakness, deconditioning, and pain limited Finger Nose Finger Test: intact bilaterally Heel Shin Test: WFL bilaterally Motor  Motor Motor: Within Functional Limits Motor - Skilled Clinical Observations: global weakness/deconditioning  Mobility Bed Mobility Bed Mobility: Rolling Left;Supine to Sit;Sit to Supine;Rolling Right Rolling Right: Independent with assistive device Rolling Left: Independent with assistive device Supine to Sit: Independent with assistive device Sit to Supine: Independent with assistive device Transfers Transfers: Sit to Stand;Stand to Sit;Stand Pivot Transfers Sit to Stand: Independent with assistive device Stand to Sit: Independent with assistive device Stand Pivot Transfers: Independent with assistive device Transfer (Assistive device): Rollator Locomotion  Gait Ambulation: Yes Gait Assistance: Independent with assistive device Gait Distance (Feet): 171 Feet Assistive device: Rollator Gait Gait: Yes Gait Pattern: Within Functional Limits Gait velocity: decreased Stairs / Additional Locomotion Stairs: Yes Stairs Assistance: Independent with assistive device (SPC and/or RW) Stair Management Technique: No rails Number of Stairs: 1 Height of Stairs: 6 Wheelchair Mobility Wheelchair Mobility: No  Trunk/Postural Assessment  Cervical Assessment Cervical Assessment: Within Functional Limits Thoracic Assessment Thoracic Assessment: Exceptions to [facility] (rounded shoulders) Lumbar Assessment Lumbar Assessment: Exceptions to [facility] (flexibile posterior pelvic tilt) Postural Control Postural  Control: Within Functional Limits  Balance Balance Balance Assessed: Yes Static Sitting Balance Static Sitting - Balance Support: Feet supported;No upper extremity supported Static Sitting - Level of Assistance: 7: Independent Dynamic Sitting Balance Dynamic Sitting - Balance Support: No upper extremity supported;Feet supported Dynamic Sitting - Level of Assistance: 6: Modified independent (Device/Increase time) Static Standing Balance Static Standing - Balance Support: Bilateral upper extremity supported;During functional activity Static Standing - Level of Assistance: 6: Modified independent (Device/Increase time) Dynamic Standing Balance Dynamic Standing - Balance  Support: Bilateral upper extremity supported;During functional activity Dynamic Standing - Level of Assistance: 6: Modified independent (Device/Increase time) Extremity Assessment  RLE Assessment RLE Assessment: Exceptions to [facility] General Strength Comments: grossly 4/5 LLE Assessment LLE Assessment: Exceptions to [facility] General Strength Comments: grossly 4/5  Today's Interventions:    Pt supine in bed upon arrival. Pt agreeable to therapy. Pt denies any pain. Pt in very pleasant mood. Pt reports she slept better last night.   Pt performed discharge assessment. Pt ambulated 171 feet with rollator and mod I and initiated/performed seated rest break on rollator with mod I.   Pt provided teach back of methods to reduce fall risk. Therapist recommended pt keep a charged pt on her at all times to call for assistance in the instance of a fall.   Dicussed using rollator or SPC in house and use of Rollator in community 2/2 endurance deficits.Pt reports at this time she will use rollator in house and community 2/2 convenience to allow her to carry her items throughout the house.   Pt requesting to trial stair navigation with cane for home entry. Pt navigated 6 inch curb step x6 with supervision progressing to mod I.   Pt picked up  cones off of floor with use of rollator and reacher, with supervision progressing to mod I. Verbal cues provided initially for positioning of rollator and locking brakes.   Reviewed the following HEP, handout provided.   sit to stand   Standing hip abduction   Standing hip extension   Standing marching  Standing heel raises  Recommended pt walk loop around inside of house every hour with use of rollator.   Pt supine in bed with all needs within reach and bed alarm on.     [facility] [place], [place], DPT  07/14/2023, 9:58 AM

## 2023-07-14 NOTE — Discharge Instructions (Addendum)
 Inpatient Rehab Discharge Instructions  Jade Cardenas Discharge date and time: 07/14/23   Activities/Precautions/ Functional Status: Activity: no lifting, driving, or strenuous exercise for till cleared by MD Diet: AS tolerated.  Wound Care: keep wound clean and dry   Functional status:  ___ No restrictions     ___ Walk up steps independently ___ 24/7 supervision/assistance   ___ Walk up steps with assistance ___ Intermittent supervision/assistance  ___ Bathe/dress independently ___ Walk with walker     ___ Bathe/dress with assistance ___ Walk Independently    ___ Shower independently ___ Walk with assistance    ___ Shower with assistance ___ No alcohol     ___ Return to work/school ________  Special Instructions:  COMMUNITY REFERRALS UPON DISCHARGE:    Home Health:   PT  &  OT                Agency:[facility] Phone:[phone]    Medical Equipment/Items Ordered:NO NEEDS HAS FROM PREVIOUS SURGERIES                                                 Agency/Supplier:NA   My questions have been answered and I understand these instructions. I will adhere to these goals and the provided educational materials after my discharge from the hospital.  Patient/Caregiver Signature _______________________________ Date __________  Clinician Signature _______________________________________ Date __________  Please bring this form and your medication list with you to all your follow-up doctor's appointments.  ____________________________________________________________________________________________________________  Information on my medicine - ELIQUIS (apixaban)  This medication education was reviewed with me or my healthcare representative as part of my discharge preparation.    Why was Eliquis prescribed for you? Eliquis was prescribed to treat blood clots that may have been found in the veins of your legs (deep vein thrombosis) or in your lungs (pulmonary  embolism) and to reduce the risk of them occurring again.  What do You need to know about Eliquis ? The starting dose is 10 mg (two 5 mg tablets) taken TWICE daily  for next 24 hours (5/15 PM dose and 5/16 AM dose), then on  evening of 07/16/2023  the dose is reduced to ONE 5 mg tablet taken TWICE daily.  Eliquis may be taken with or without food.   Try to take the dose about the same time in the morning and in the evening. If you have difficulty swallowing the tablet whole please discuss with your pharmacist how to take the medication safely.  Take Eliquis exactly as prescribed and DO NOT stop taking Eliquis without talking to the doctor who prescribed the medication.  Stopping may increase your risk of developing a new blood clot.  Refill your prescription before you run out.  After discharge, you should have regular check-up appointments with your healthcare provider that is prescribing your Eliquis.    What do you do if you miss a dose? If a dose of ELIQUIS is not taken at the scheduled time, take it as soon as possible on the same day and twice-daily administration should be resumed. The dose should not be doubled to make up for a missed dose.  Important Safety Information A possible side effect of Eliquis is bleeding. You should call your healthcare provider right away if you experience any of the following: Bleeding from an injury or your nose that does  not stop. Unusual colored urine (red or dark brown) or unusual colored stools (red or black). Unusual bruising for unknown reasons. A serious fall or if you hit your head (even if there is no bleeding).  Some medicines may interact with Eliquis and might increase your risk of bleeding or clotting while on Eliquis. To help avoid this, consult your healthcare provider or pharmacist prior to using any new prescription or non-prescription medications, including herbals, vitamins, non-steroidal anti-inflammatory drugs (NSAIDs) and  supplements.  This website has more information on Eliquis (apixaban): http://www.eliquis.com/eliquis/home

## 2023-07-14 NOTE — Progress Notes (Signed)
 Occupational Therapy Discharge Summary  Patient Details  Name: Jade Cardenas MRN: [id number] Date of Birth: 30-Apr-1950  Date of Discharge from OT service:Jul 14, 2023     Patient has met 13 of 13 long term goals due to improved activity tolerance, improved balance, postural control, and ability to compensate for deficits.  Patient to discharge at overall Modified Independent level.  Patient's care partner is independent to provide the necessary physical assistance at discharge.    Reasons goals not met: N/A  Recommendation:  Patient will benefit from ongoing skilled OT services in home health setting to continue to advance functional skills in the area of BADL and iADL.  Equipment: Pt has all necessary DME at home  Reasons for discharge: treatment goals met and discharge from hospital  Patient/family agrees with progress made and goals achieved: Yes  OT Discharge Precautions/Restrictions  Precautions Precautions: Fall Recall of Precautions/Restrictions: Intact Precaution/Restrictions Comments: Abdominal incision Restrictions Weight Bearing Restrictions Per Provider Order: No ADL ADL Equipment Provided: Reacher Eating: Independent Where Assessed-Eating: Bed level Grooming: Modified independent Where Assessed-Grooming: Sitting at sink Upper Body Bathing: Modified independent Where Assessed-Upper Body Bathing: Shower Lower Body Bathing: Modified independent Where Assessed-Lower Body Bathing: Shower Upper Body Dressing: Modified independent (Device) Where Assessed-Upper Body Dressing: Sitting at sink Lower Body Dressing: Modified independent Where Assessed-Lower Body Dressing: Sitting at sink Toileting: Modified independent Where Assessed-Toileting: Bedside Commode Toilet Transfer: Modified independent Toilet Transfer Method: Proofreader: Gaffer: Not assessed Film/video editor: Modified  independent Film/video editor Method: Designer, industrial/product: Emergency planning/management officer ADL Comments: CGA UB, Mod A LB, needs AE training (has reacher and issued LH sponge), min A SPT commode over toilet Vision Baseline Vision/History: 1 Wears glasses;4 Cataracts Vision Assessment?: Wears glasses for reading;Wears glasses for driving Perception  Perception: Within Functional Limits Praxis Praxis: WFL Cognition Cognition Overall Cognitive Status: Within Functional Limits for tasks assessed Arousal/Alertness: Awake/alert Memory: Appears intact Awareness: Appears intact Problem Solving: Appears intact Safety/Judgment: Appears intact Brief Interview for Mental Status (BIMS) Repetition of Three Words (First Attempt): 3 Temporal Orientation: Year: Correct Temporal Orientation: Month: Accurate within 5 days Temporal Orientation: Day: Correct Recall: "Sock": Yes, no cue required Recall: "Blue": Yes, no cue required Recall: "Bed": Yes, no cue required BIMS Summary Score: 15 Sensation Sensation Light Touch: Impaired Detail Peripheral sensation comments: Neuropathy in BLE (RLE>LLE) Light Touch Impaired Details: Impaired RLE;Impaired LLE Hot/Cold: Appears Intact Proprioception: Appears Intact Stereognosis: Not tested Additional Comments: baseline neuropathy B, R LE>L LE Coordination Gross Motor Movements are Fluid and Coordinated: Yes Fine Motor Movements are Fluid and Coordinated: Yes Coordination and Movement Description: Limited by generalized weakness/deconditioning & pain. Finger Nose Finger Test: intact bilaterally Heel Shin Test: [facility] bilaterally Motor  Motor Motor: Within Functional Limits Motor - Skilled Clinical Observations: global weakness/deconditioning Mobility  Bed Mobility Bed Mobility: Rolling Left;Supine to Sit;Sit to Supine;Rolling Right Rolling Right: Independent with assistive device Rolling Left: Independent with assistive device Supine to Sit:  Independent with assistive device Sit to Supine: Independent with assistive device Transfers Sit to Stand: Independent with assistive device Stand to Sit: Independent with assistive device  Trunk/Postural Assessment  Cervical Assessment Cervical Assessment: Within Functional Limits Thoracic Assessment Thoracic Assessment: Exceptions to [facility] (rounded shoulders) Lumbar Assessment Lumbar Assessment: Exceptions to [facility] (flexibile posterior pelvic tilt) Postural Control Postural Control: Within Functional Limits  Balance Balance Balance Assessed: Yes Static Sitting Balance Static Sitting - Balance Support: Feet supported;No upper extremity supported Static Sitting -  Level of Assistance: 7: Independent Dynamic Sitting Balance Dynamic Sitting - Balance Support: No upper extremity supported;Feet supported Dynamic Sitting - Level of Assistance: 6: Modified independent (Device/Increase time) Static Standing Balance Static Standing - Balance Support: Bilateral upper extremity supported;During functional activity Static Standing - Level of Assistance: 6: Modified independent (Device/Increase time) Dynamic Standing Balance Dynamic Standing - Balance Support: Bilateral upper extremity supported;During functional activity Dynamic Standing - Level of Assistance: 6: Modified independent (Device/Increase time) Extremity/Trunk Assessment RUE Assessment Active Range of Motion (AROM) Comments: WFL in all ranges General Strength Comments: 4/5 grossly LUE Assessment Active Range of Motion (AROM) Comments: WFL in all ranges General Strength Comments: 4/5 grossly   Burleigh Carp 07/14/2023, 1:45 PM  Carollee Circle, OTR/L,CBIS  Supplemental OT - [facility] and [facility] Secure Chat Preferred

## 2023-07-14 NOTE — Progress Notes (Signed)
 PROGRESS NOTE   Subjective/Complaints: Pt feels better today, still feels like she has too many medications. Seen by surgery today, staples removed. Ok to shower per surgery. Looking forward to DC.  LBM today  ROS: Patient denies fever, new vision changes, dizziness, nausea, vomiting, diarrhea,  shortness of breath or chest pain, headache, or mood change.  + chronic joint pain  + Large daily bowel movements +dysphagia chronic  Objective:   No results found.  Recent Labs    07/12/23 0554 07/13/23 0540  WBC 11.0*  --   HGB 11.9* 11.4*  HCT 35.9* 35.1*  PLT 660*  --    Recent Labs    07/12/23 0554  NA 138  K 3.5  CL 104  CO2 25  GLUCOSE 118*  BUN 16  CREATININE 0.77  CALCIUM  8.6*     Intake/Output Summary (Last 24 hours) at 07/14/2023 1129 Last data filed at 07/14/2023 0737 Gross per 24 hour  Intake 592 ml  Output --  Net 592 ml        Physical Exam: Vital Signs Blood pressure (!) 148/54, pulse 81, temperature 98.5 F (36.9 C), resp. rate 20, height 5\' 5"  (1.651 m), weight 97.3 kg, last menstrual period 03/02/1984, SpO2 96%.      General: No apparent distress, laying in bed,appears comfortable HEENT: Head is normocephalic, atraumatic, sclera anicteric, oral mucosa pink and moist Neck: Supple without JVD or lymphadenopathy Heart: Reg rate and rhythm. No murmurs rubs or gallops. + chest wall TTP b/l Chest: CTA bilaterally without wheezes, rales, or rhonchi; no distress Abdomen: Midline incision as well as multiple smaller incisions C/D/I with staples have been removed Gastrostomy tube site with crusted blood--non tender.   Extremities: No clubbing, cyanosis, or edema. Pulses are 2+ Psych: Pt's affect is appropriate. Pt is cooperative  Skin: Ecchymotic area LUE at site of prior IV?    Neuro:   Alert and oriented x 3. Normal insight and awareness. Intact Memory. Normal language and speech. Cranial  nerve exam unremarkable. MMT: BUE 4-/5 proximally to 4/5 distally. BLE 3+/5 HF, 4/5 KE and 4+/5 ADF/PF. Sensory exam normal for light touch and pain in all 4 limbs. No limb ataxia or cerebellar signs. No abnormal tone appreciated.  .     Neuro exam stable 5/14  SENSORY: Normal to touch all 4 extremities  Coordination: Normal finger to nose and heel to shin, no tremor, no dysmetria   MSK: Chronic arthritic changes in hands/fingers.     Assessment/Plan: 1. Functional deficits which require 3+ hours per day of interdisciplinary therapy in a comprehensive inpatient rehab setting. Physiatrist is providing close team supervision and 24 hour management of active medical problems listed below. Physiatrist and rehab team continue to assess barriers to discharge/monitor patient progress toward functional and medical goals  Care Tool:  Bathing    Body parts bathed by patient: Right arm, Left arm, Chest, Front perineal area, Left upper leg, Right lower leg, Face   Body parts bathed by helper: Buttocks, Abdomen, Left lower leg, Right lower leg     Bathing assist Assist Level: Moderate Assistance - Patient 50 - 74%     Upper Body Dressing/Undressing Upper  body dressing   What is the patient wearing?: Pull over shirt    Upper body assist Assist Level: Contact Guard/Touching assist    Lower Body Dressing/Undressing Lower body dressing      What is the patient wearing?: Underwear/pull up, Pants     Lower body assist Assist for lower body dressing: Maximal Assistance - Patient 25 - 49%     Toileting Toileting    Toileting assist Assist for toileting: Supervision/Verbal cueing     Transfers Chair/bed transfer  Transfers assist     Chair/bed transfer assist level: Supervision/Verbal cueing     Locomotion Ambulation   Ambulation assist      Assist level: Supervision/Verbal cueing Assistive device: Rollator Max distance: 180   Walk 10 feet activity   Assist   Walk 10 feet activity did not occur: Safety/medical concerns (dizziness, pain)  Assist level: Supervision/Verbal cueing Assistive device: Cane-straight   Walk 50 feet activity   Assist Walk 50 feet with 2 turns activity did not occur: Safety/medical concerns (dizziness, pain)  Assist level: Supervision/Verbal cueing Assistive device: Cane-straight    Walk 150 feet activity   Assist Walk 150 feet activity did not occur: Safety/medical concerns (dizziness, pain)  Assist level: Supervision/Verbal cueing Assistive device: Rollator    Walk 10 feet on uneven surface  activity   Assist Walk 10 feet on uneven surfaces activity did not occur: Safety/medical concerns (dizziness, pain)         Wheelchair     Assist Is the patient using a wheelchair?: Yes Type of Wheelchair: Manual    Wheelchair assist level: Dependent - Patient 0% Max wheelchair distance: 152ft    Wheelchair 50 feet with 2 turns activity    Assist        Assist Level: Dependent - Patient 0%   Wheelchair 150 feet activity     Assist      Assist Level: Dependent - Patient 0%   Blood pressure (!) 148/54, pulse 81, temperature 98.5 F (36.9 C), resp. rate 20, height 5\' 5"  (1.651 m), weight 97.3 kg, last menstrual period 03/02/1984, SpO2 96%.   Medical Problem List and Plan: 1. Functional deficits secondary to debility after hiatal hernia repair/fundoplication/splenic artery repair and subsequent complications             -patient may shower, Ok get incision wet             -ELOS/Goals: 10-12 days, supervision goals with PT and OT  -Con't CIR  -Team conference today please see physician documentation under team conference tab, met with team  to discuss problems,progress, and goals. Formulized individual treatment plan based on medical history, underlying problem and comorbidities.     2.  Antithrombotics: -DVT/anticoagulation:  LLE DVTs- multiple- got OK for Tx dose lovenox since labs  delayed on getting Heparin  levels- wil do 90 mg SQ on thighs or triceps BID- and stop Heparin  gtt- and check labs daily 5/11- Hb 12- doing in triceps which is Ok'd by Pharmacy-               -antiplatelet therapy: N/A 3. Pain Management: Oxycodone  prn severe pain and ultram  prn moderate pain             --decrease tylenol  to 650 mg qid.   -Continue robaxin3  5/11- D/c'd tramadol - since doesn't help her.  4. Mood/Behavior/Sleep: LCSW to follow for evaluation and support.             --team to provide encouragement/ego support.              -  antipsychotic agents: N/A  -5/7 patient would like to use as diphenhydramine  as needed for sleep, will DC trazodone  for now.  She uses Tylenol  PM at home which is Tylenol  and diphenhydramine   5/13 start trazodone  at night 50 mg for insomnia  5/14 continue trazodone  current dose 5. Neuropsych/cognition: This patient is capable of making decisions on her own behalf. 6. Skin/Wound Care: Routine pressure relief measures. Monitor incisions for healing. No dressings needed 7. Fluids/Electrolytes/Nutrition: Monitor I/O. Change breeze to ensure to avoid GERD             --friends to bring in food/supplements from home             --will continue to monitor for hypoglycemic episode.   5/7 will DC CBG in am, continue lunch,dinner and HS  -DC CBG  -5/9 Adjust protonix  to before meals as more effective taken in this manner  5/11- Protonix  changed to 6:30 and 4pm- added Pepcid 40 mg at 11am per pt request  5/12 Pepcid was started yesterday, try Carafate, could consider GI consult if not improved  5/13-14 will contact GI regarding her GERD symptoms-- advised to check with surgery team- GERD symptoms expected, upright after eating, consider tums 8. Acute on chronic anemia: Has had rise in H/H from 6.1-->13.4 question accurate. Transfused with 4 units post op. --Will recheck in am  --was getting Iron infusions by Dr. Salomon Cree.  Her hematologist was notified about her  admission  5/7 HGB stable 11.6 5/10- labs in AM/ and daily for 3 days-  5/12-stabe hemoglobin 11.9 5/13 HGB a little lower at 11.  recheck pending 9. Mediastinal hematoma: Had episode of hematemesis vs hemoptysis last night (isolated) --Will check CXR for follow up.  **CXR reviewed, ?CHF, has EF of 65%, chest clear on exam             -follow weights. Monitor clinically for now             -she's already a little dry by BUN             -re check labs in AM -5/7 will give lasix , 40mg  today and 20mg  daily, and potassium supplement. BUN/Cr stable-continue to monitor. Continue Delsym  for cough prn -5/8 Will order echo- last one I see is from 2008?   5/9 check echocardiogram today, discussed with patient 5/10- pt's family was asking about ECHO- went over results again with pt on 2nd visit to speak with her.  Lasix  completed, K+ supplement complete 10. Chest wall pain: Likely due to CPR. Continue to encourage deep breathing/cough every hour  -5/8 lidocaine  patches ordered x2- providing mild benefit  11. Persistent leucocytosis: Monitor for fevers and signs of infection             --WBC has trended in 14-18 range.              -afebrile at present  -5/8 WBC down to 12.8  -5/12 improved to 11  Recheck  Episode of Hematemesis/Nocturnal cough: Continues to have chest pain 12. Constipation: Has not had BM since last week?Aaron Aas Moderate stool noted in colon on X rays.  and relays abdominal fullness.               --will add senna to colace              -does realize that her po intake needs to pick up as well  -5/8 multiple BMs over the past 24 hours, likely had buildup of  stool over the past several days, continue current regimen for now  5/9 DC senokot, lactaid  5/10- still having multiple loose stools- 3-4x/ yesterday- will monitor with liquid diet she' wants due to swallowing issues  5/11- started lactid TID with meals for pt  5/12 having large daily bowel movements  LBM 5/14, continue to  monitor 13. Urinary retention:  Has voided twice since foley came out yesterday. Needs to drink more             --will monitor voiding with bladder scans.  -5/8 continent PVR 0 today  -5/9 remains continent, monitor  14. Pre-renal azotemia: Encourage fluid/po intake.   5/8 BUN and creatinine stable at 19/0.74 15. RA/Psoriatic arthritis: Reports diffuse pain for past month (due to need to hold meds pre-op)   --Has been cleared to resume Methotrexate  (today) and Simponi  (friend to bring in tomorrow)  16. LLE DVT's- multiple-  5/10- changed to SQ tx dose lovenox 90 mg BID- to be given on triceps or thighs- not abdomen per pt  request- since has incision and PEG.  5/13 continue Lovenox, consider transition to DOAC, monitor CBG 5/14 change to Abixaban, pharm consult 17. Hx of HTN/Orthostatic hypotension  5/11- restarted Home Losartan  25 mg daily- not sure of home dose, but this appears like will handle BP elevation  5/12 BP stable/improved, continue current regimen and monitor   5/13 BP a little soft, questionable symptoms of orthostatic hypotension with standing.  Decrease losartan  to 12.5mg  twice daily  5/14 fair control, continue current regimen and monitor       07/14/2023    3:24 AM 07/13/2023    7:39 PM 07/13/2023    1:46 PM  Vitals with BMI  Weight 214 lbs 8 oz    BMI 35.7    Systolic 148 149 161  Diastolic 54 60 59  Pulse 81 88 94        LOS: 8 days A FACE TO FACE EVALUATION WAS PERFORMED  Jade Cardenas 07/14/2023, 11:29 AM

## 2023-07-14 NOTE — Patient Care Conference (Signed)
 Inpatient RehabilitationTeam Conference and Plan of Care Update Date: 07/14/2023   Time: 12:03 PM    Patient Name: Jade Cardenas      Medical Record Number: [id number]  Date of Birth: 1951-01-27 Sex: Female         Room/Bed: 4W05C/4W05C-01 Payor Info: Payor: MEDICARE / Plan: MEDICARE PART A AND B / Product Type: *No Product type* /    Admit Date/Time:  07/06/2023  2:21 PM  Primary Diagnosis:  Debility  Hospital Problems: Principal Problem:   Debility    Expected Discharge Date: Expected Discharge Date: 07/15/23  Team Members Present: Physician leading conference: Dr. Lylia Sand Social Worker Present: Adrianna Albee, LCSW Nurse Present: Forrestine Ike, RN PT Present: Jenney Modest, PT OT Present: Carollee Circle, OT PPS Coordinator present : Jestine Moron, SLP     Current Status/Progress Goal Weekly Team Focus  Bowel/Bladder   continent of b/b [facility] 5/13   maintain continence   continue timed toileting    Swallow/Nutrition/ Hydration               ADL's   Mod I for UB/LB, toileting, transfers   mod I   ready for discharge    Mobility   bed mobility mod I, sit to stand mod I, stand pivot transfer mod I with use of rollator, ambulation x90 feet with SPC and sup/mod I, gait x 180 feet with rollator and supervision mod I, 1 step with supervision with use of mod I with RW and/or SPC   mod I-pt at goal level  D/C 5/15, HHPT pt has all DME needed    Communication                Safety/Cognition/ Behavioral Observations               Pain   c/o pain in abdomin incision site. and spasms in legs. Pain controlled with oxy and tylenol    control pain at <3/10   assess pain qshift and PRN    Skin   surgical incision stapled OTA incision clean no discharge   maintain infection free incision  assess incision qshift and PRN      Discharge Planning:  Home with friends who will assist -very good friends support. Should be ready for DC this week   Team  Discussion: Patient post hiatal hernia repair with hemorrhagic shock; debility. MD adjusted BP medications and addressed GERD, insomnia and continued Eliquis for DVT prophylaxis.  Patient on target to meet rehab goals: yes, currently mod I using a rollator.  *See Care Plan and progress notes for long and short-term goals.   Revisions to Treatment Plan:  N/a   Teaching Needs: Safety, diet modification, medications, transfers, toileting, etc.   Current Barriers to Discharge: Decreased caregiver support  Possible Resolutions to Barriers: Support group education HH follow up services     Medical Summary Current Status: Debility, hiatal hernia, GERD, constipation, DVT, HTN  Barriers to Discharge: Hypotension;Medical stability;Self-care education;Volume Overload  Barriers to Discharge Comments: Debility, hiatal hernia, GERD, constipation, DVT, HTN Possible Resolutions to [employer] Focus: apixaban, monitor BP, continue PPI, pain medications   Continued Need for Acute Rehabilitation Level of Care: The patient requires daily medical management by a physician with specialized training in physical medicine and rehabilitation for the following reasons: Direction of a multidisciplinary physical rehabilitation program to maximize functional independence : Yes Medical management of patient stability for increased activity during participation in an intensive rehabilitation regime.: Yes Analysis of laboratory values and/or  radiology reports with any subsequent need for medication adjustment and/or medical intervention. : Yes   I attest that I was present, lead the team conference, and concur with the assessment and plan of the team.   Forrestine Ike B 07/14/2023, 3:15 PM

## 2023-07-14 NOTE — Progress Notes (Signed)
 Patient ID: Jade Cardenas, female   DOB: 03/08/1950, 73 y.o.   MRN: [id number]  Met with pt to give team conference update regarding goals of mod/I level and discharge tomorrow. She feels ready and wants home health and then transition to OP. Referral made to [facility] since no [name] can be found who works for a [employer]. Pt in agreement with this. Feels ready to go home.

## 2023-07-14 NOTE — Plan of Care (Signed)

## 2023-07-14 NOTE — Progress Notes (Signed)
 Occupational Therapy Session Note  Patient Details  Name: Jade Cardenas MRN: [id number] Date of Birth: Jan 18, 1951  Today's Date: 07/14/2023 OT Individual Time: 5638-7564 OT Individual Time Calculation (min): 30 min  and Today's Date: 07/14/2023 OT Missed Time: 10 Minutes Missed Time Reason: Nursing care   Short Term Goals: Week 2:  OT Short Term Goal 1 (Week 2): STG= LTG (d/t ELOS)  Skilled Therapeutic Interventions/Progress Updates:  Pt received resting in bed with no complaints of pain. Pt requires some encouragement to participate in session, receptive to room-level therapy. Pt and OT review handout for energy conservation techniques/strategies, emphasis placed on pacing, prioritizing, and planning of daily activities. Pt receptive to discussion. Pt remained resting in bed, missing ~10 mins of skilled session due to IV team.  Therapy Documentation Precautions:  Precautions Precautions: Fall Recall of Precautions/Restrictions: Intact Restrictions Weight Bearing Restrictions Per Provider Order: No   Therapy/Group: Individual Therapy  Artemus Biles, OTR/L, MSOT  07/14/2023, 6:29 AM

## 2023-07-14 NOTE — Progress Notes (Signed)
 Physical Therapy Session Note  Patient Details  Name: Jade Cardenas MRN: [id number] Date of Birth: 06-Dec-1950  Today's Date: 07/14/2023 PT Individual Time: 4259-5638; 1434 - 1500  PT Individual Time Calculation (min): 27 min; 26 min   Short Term Goals: Week 1:  PT Short Term Goal 1 (Week 1): STG=LTG due to LOS  SESSION 1 Skilled Therapeutic Interventions/Progress Updates: Patient supine in bed on entrance to room. Patient alert and agreeable to PT session.   Patient with no complaints of pain  Therapeutic Activity: Bed Mobility: Pt performed supine<>sit on EOB with modI.   - Pt ambulated from room<>day room gym with rollator and with supervision for safety purposes only. Pt performed in order to improve ambulatory tolerance. - Pt ambulated roughly 160' around nsg/day room loop with SPC on L UE with light CGA/close supervision for safety. PTA asked why pt ambulated with L UE. Pt reported past surgery on L knee/tibial fracture. PTA educated that cane should have been on opposite extremity with demo provided. Pt reported that R knee needs to be replaced and can cause pain. - Cone taps alternating B LE's to decrease fear of falling as pt stating that she didn't think she could do so without holding on to something. Pt started with L HHA, the progressed to only CGA and required seated rest break due to fatigue. No LOB noted. Pt appreciative.  Patient supine in bed at end of session with brakes locked, bed alarm set, and all needs within reach.  SESSION 2 Skilled Therapeutic Interventions/Progress Updates: Patient supine in bed on entrance to room. Patient alert and agreeable to PT session.   Pt reported 8/10 pain in ribs with medication already provided prior to PTA arrival. Rest breaks and light session of pt going over HEP prior to d/c provided to avoid increase in pain. Pt ambulated from room<>day room gym with rollator modI.  Therapeutic Activity:  Pt performed the following HEP  provided by attending PT with pt requiring VC for body mechanics/sequence - sit<>stand arms crossed - Standing march B UE supported - B heel raises B UE supported - Standing hip extension B LE B UE supported - B standing hip abduction   Patient sitting in recliner at end of session with brakes locked, modI in room and all needs within reach.      Therapy Documentation Precautions:  Precautions Precautions: Fall Recall of Precautions/Restrictions: Intact Precaution/Restrictions Comments: G-tube, abdominal incision Restrictions Weight Bearing Restrictions Per Provider Order: No  Therapy/Group: Individual Therapy  Chaddrick Brue PTA 07/14/2023, 12:43 PM

## 2023-07-14 NOTE — Progress Notes (Signed)
 STaples removed at [facility]. Steris placed. Pt OK to shower and get incisions wet.

## 2023-07-14 NOTE — Plan of Care (Signed)
  Problem: RH Balance Goal: LTG: Patient will maintain dynamic sitting balance (OT) Description: LTG:  Patient will maintain dynamic sitting balance with assistance during activities of daily living (OT) Outcome: Completed/Met Goal: LTG Patient will maintain dynamic standing with ADLs (OT) Description: LTG:  Patient will maintain dynamic standing balance with assist during activities of daily living (OT)  Outcome: Completed/Met   Problem: Sit to Stand Goal: LTG:  Patient will perform sit to stand in prep for activites of daily living with assistance level (OT) Description: LTG:  Patient will perform sit to stand in prep for activites of daily living with assistance level (OT) Outcome: Completed/Met   Problem: RH Grooming Goal: LTG Patient will perform grooming w/assist,cues/equip (OT) Description: LTG: Patient will perform grooming with assist, with/without cues using equipment (OT) Outcome: Completed/Met   Problem: RH Bathing Goal: LTG Patient will bathe all body parts with assist levels (OT) Description: LTG: Patient will bathe all body parts with assist levels (OT) Outcome: Completed/Met   Problem: RH Dressing Goal: LTG Patient will perform upper body dressing (OT) Description: LTG Patient will perform upper body dressing with assist, with/without cues (OT). Outcome: Completed/Met Goal: LTG Patient will perform lower body dressing w/assist (OT) Description: LTG: Patient will perform lower body dressing with assist, with/without cues in positioning using equipment (OT) Outcome: Completed/Met   Problem: RH Toileting Goal: LTG Patient will perform toileting task (3/3 steps) with assistance level (OT) Description: LTG: Patient will perform toileting task (3/3 steps) with assistance level (OT)  Outcome: Completed/Met   Problem: RH Functional Use of Upper Extremity Goal: LTG Patient will use RT/LT upper extremity as a (OT) Description: LTG: Patient will use right/left upper  extremity as a stabilizer/gross assist/diminished/nondominant/dominant level with assist, with/without cues during functional activity (OT) Outcome: Completed/Met   Problem: RH Laundry Goal: LTG Patient will perform laundry w/assist, cues (OT) Description: LTG: Patient will perform laundry with assistance, with/without cues (OT). Outcome: Completed/Met   Problem: RH Simple Meal Prep Goal: LTG Patient will perform simple meal prep w/assist (OT) Description: LTG: Patient will perform simple meal prep with assistance, with/without cues (OT). Outcome: Completed/Met   Problem: RH Toilet Transfers Goal: LTG Patient will perform toilet transfers w/assist (OT) Description: LTG: Patient will perform toilet transfers with assist, with/without cues using equipment (OT) Outcome: Completed/Met   Problem: RH Tub/Shower Transfers Goal: LTG Patient will perform tub/shower transfers w/assist (OT) Description: LTG: Patient will perform tub/shower transfers with assist, with/without cues using equipment (OT) Outcome: Completed/Met

## 2023-07-15 ENCOUNTER — Other Ambulatory Visit (HOSPITAL_COMMUNITY): Payer: Self-pay

## 2023-07-15 DIAGNOSIS — R7989 Other specified abnormal findings of blood chemistry: Secondary | ICD-10-CM

## 2023-07-15 LAB — BASIC METABOLIC PANEL WITH GFR
Anion gap: 10 (ref 5–15)
BUN: 15 mg/dL (ref 8–23)
CO2: 23 mmol/L (ref 22–32)
Calcium: 8.9 mg/dL (ref 8.9–10.3)
Chloride: 103 mmol/L (ref 98–111)
Creatinine, Ser: 0.73 mg/dL (ref 0.44–1.00)
GFR, Estimated: >60 mL/min (ref >60)
Glucose, Bld: 121 mg/dL — ABNORMAL HIGH (ref 70–99)
Potassium: 4.7 mmol/L (ref 3.5–5.1)
Sodium: 136 mmol/L (ref 135–145)

## 2023-07-15 LAB — CBC
HCT: 35.3 % — ABNORMAL LOW (ref 36.0–46.0)
Hemoglobin: 11.6 g/dL — ABNORMAL LOW (ref 12.0–15.0)
MCH: 30.9 pg (ref 26.0–34.0)
MCHC: 32.9 g/dL (ref 30.0–36.0)
MCV: 93.9 fL (ref 80.0–100.0)
Platelets: 532 10*3/uL — ABNORMAL HIGH (ref 150–400)
RBC: 3.76 MIL/uL — ABNORMAL LOW (ref 3.87–5.11)
RDW: 15 % (ref 11.5–15.5)
WBC: 10 10*3/uL (ref 4.0–10.5)
nRBC: 0 % (ref 0.0–0.2)

## 2023-07-15 MED ORDER — APIXABAN (ELIQUIS) EDUCATION KIT FOR DVT/PE PATIENTS
PACK | Freq: Once | Status: AC
  Filled 2023-07-15: qty 1

## 2023-07-15 NOTE — Progress Notes (Signed)
 PROGRESS NOTE   Subjective/Complaints: Patient looking forward to going home today.  Little tired did not sleep well last night.  ROS: Patient denies fever, new vision changes, dizziness, nausea, vomiting, diarrhea,  shortness of breath or chest pain, headache, or mood change.  + chronic joint pain  +dysphagia chronic + insomnia   Objective:   No results found.  Recent Labs    07/14/23 1331 07/15/23 0530  WBC 10.0 10.0  HGB 11.8* 11.6*  HCT 37.2 35.3*  PLT 714* 532*   Recent Labs    07/15/23 0530  NA 136  K 4.7  CL 103  CO2 23  GLUCOSE 121*  BUN 15  CREATININE 0.73  CALCIUM  8.9     Intake/Output Summary (Last 24 hours) at 07/15/2023 1714 Last data filed at 07/14/2023 1837 Gross per 24 hour  Intake 240 ml  Output --  Net 240 ml        Physical Exam: Vital Signs Blood pressure (!) 147/57, pulse 87, temperature 98.1 F (36.7 C), temperature source Oral, resp. rate 16, height 5\' 5"  (1.651 m), weight 97 kg, last menstrual period 03/02/1984, SpO2 99%.      General: No apparent distress, laying in bed,appears comfortable HEENT: Head is normocephalic, atraumatic, sclera anicteric, oral mucosa pink and moist Neck: Supple without JVD or lymphadenopathy Heart: Reg rate and rhythm. No murmurs rubs or gallops. + chest wall TTP b/l Chest: CTA bilaterally without wheezes, rales, or rhonchi; no distress Abdomen: Midline incision as well as multiple smaller incisions C/D/I with staples have been removed Gastrostomy tube site with crusted blood--non tender.   Extremities: No clubbing, cyanosis, or edema. Pulses are 2+ Psych: Pt's affect is appropriate. Pt is cooperative  Skin: Ecchymotic area LUE at site of prior IV?    Neuro:   Alert and oriented x 3. Normal insight and awareness. Intact Memory. Normal language and speech. Cranial nerve exam unremarkable. MMT: BUE 4-/5 proximally to 4/5 distally. BLE 3+/5 HF,  4/5 KE and 4+/5 ADF/PF. Sensory exam normal for light touch and pain in all 4 limbs. No limb ataxia or cerebellar signs. No abnormal tone appreciated.  .     Neuro exam stable 5/14  SENSORY: Normal to touch all 4 extremities  Coordination: Normal finger to nose and heel to shin, no tremor, no dysmetria   MSK: Chronic arthritic changes in hands/fingers.     Assessment/Plan: 1. Functional deficits which require 3+ hours per day of interdisciplinary therapy in a comprehensive inpatient rehab setting. Physiatrist is providing close team supervision and 24 hour management of active medical problems listed below. Physiatrist and rehab team continue to assess barriers to discharge/monitor patient progress toward functional and medical goals  Care Tool:  Bathing    Body parts bathed by patient: Right arm, Left arm, Chest, Front perineal area, Left upper leg, Right lower leg, Face, Buttocks, Abdomen, Right upper leg, Left lower leg   Body parts bathed by helper: Buttocks, Abdomen, Left lower leg, Right lower leg     Bathing assist Assist Level: Independent with assistive device Assistive Device Comment: long handled sponge, TTB, hand held shower head   Upper Body Dressing/Undressing Upper body dressing  What is the patient wearing?: Pull over shirt    Upper body assist Assist Level: Independent with assistive device    Lower Body Dressing/Undressing Lower body dressing      What is the patient wearing?: Underwear/pull up, Pants     Lower body assist Assist for lower body dressing: Independent with assitive device Assistive Device Comment: seated   Toileting Toileting    Toileting assist Assist for toileting: Independent with assistive device     Transfers Chair/bed transfer  Transfers assist     Chair/bed transfer assist level: Independent with assistive device Chair/bed transfer assistive device: Other (rollator)   Locomotion Ambulation   Ambulation  assist      Assist level: Independent with assistive device Assistive device: Rollator Max distance: 171   Walk 10 feet activity   Assist  Walk 10 feet activity did not occur: Safety/medical concerns (dizziness, pain)  Assist level: Independent with assistive device Assistive device: Rollator   Walk 50 feet activity   Assist Walk 50 feet with 2 turns activity did not occur: Safety/medical concerns (dizziness, pain)  Assist level: Independent with assistive device Assistive device: Rollator    Walk 150 feet activity   Assist Walk 150 feet activity did not occur: Safety/medical concerns (dizziness, pain)  Assist level: Independent with assistive device Assistive device: Rollator    Walk 10 feet on uneven surface  activity   Assist Walk 10 feet on uneven surfaces activity did not occur: Safety/medical concerns (dizziness, pain)   Assist level: Supervision/Verbal cueing Assistive device: Rollator   Wheelchair     Assist Is the patient using a wheelchair?: No Type of Wheelchair: Manual    Wheelchair assist level: Dependent - Patient 0% Max wheelchair distance: 145ft    Wheelchair 50 feet with 2 turns activity    Assist        Assist Level: Dependent - Patient 0%   Wheelchair 150 feet activity     Assist      Assist Level: Dependent - Patient 0%   Blood pressure (!) 147/57, pulse 87, temperature 98.1 F (36.7 C), temperature source Oral, resp. rate 16, height 5\' 5"  (1.651 m), weight 97 kg, last menstrual period 03/02/1984, SpO2 99%.   Medical Problem List and Plan: 1. Functional deficits secondary to debility after hiatal hernia repair/fundoplication/splenic artery repair and subsequent complications             -patient may shower, Ok get incision wet             -ELOS/Goals: 10-12 days, supervision goals with PT and OT  -Con't CIR  -Team conference today please see physician documentation under team conference tab, met with team  to  discuss problems,progress, and goals. Formulized individual treatment plan based on medical history, underlying problem and comorbidities.     2.  Antithrombotics: -DVT/anticoagulation:  LLE DVTs- multiple- got OK for Tx dose lovenox since labs delayed on getting Heparin  levels- wil do 90 mg SQ on thighs or triceps BID- and stop Heparin  gtt- and check labs daily 5/11- Hb 12- doing in triceps which is Ok'd by Pharmacy-               -antiplatelet therapy: N/A 3. Pain Management: Oxycodone  prn severe pain and ultram  prn moderate pain             --decrease tylenol  to 650 mg qid.   -Continue robaxin3  5/11- D/c'd tramadol - since doesn't help her.  4. Mood/Behavior/Sleep: LCSW to follow for evaluation and  support.             --team to provide encouragement/ego support.              -antipsychotic agents: N/A  -5/7 patient would like to use as diphenhydramine  as needed for sleep, will DC trazodone  for now.  She uses Tylenol  PM at home which is Tylenol  and diphenhydramine   5/13 start trazodone  at night 50 mg for insomnia  5/14 continue trazodone  current dose 5. Neuropsych/cognition: This patient is capable of making decisions on her own behalf. 6. Skin/Wound Care: Routine pressure relief measures. Monitor incisions for healing. No dressings needed 7. Fluids/Electrolytes/Nutrition: Monitor I/O. Change breeze to ensure to avoid GERD             --friends to bring in food/supplements from home             --will continue to monitor for hypoglycemic episode.   5/7 will DC CBG in am, continue lunch,dinner and HS  -DC CBG  -5/9 Adjust protonix  to before meals as more effective taken in this manner  5/11- Protonix  changed to 6:30 and 4pm- added Pepcid 40 mg at 11am per pt request  5/12 Pepcid was started yesterday, try Carafate, could consider GI consult if not improved  5/13-14 will contact GI regarding her GERD symptoms-- advised to check with surgery team- GERD symptoms expected, upright after  eating, consider tums  5/15 continue current regimen, could try liquid Carafate easier to swallow but could be limited by cost 8. Acute on chronic anemia: Has had rise in H/H from 6.1-->13.4 question accurate. Transfused with 4 units post op. --Will recheck in am  --was getting Iron infusions by Dr. Salomon Cree.  Her hematologist was notified about her admission  5/7 HGB stable 11.6 5/10- labs in AM/ and daily for 3 days-  5/12-stabe hemoglobin 11.9 5/15 Hgb stable 11.6 9. Mediastinal hematoma: Had episode of hematemesis vs hemoptysis last night (isolated) --Will check CXR for follow up.  **CXR reviewed, ?CHF, has EF of 65%, chest clear on exam             -follow weights. Monitor clinically for now             -she's already a little dry by BUN             -re check labs in AM -5/7 will give lasix , 40mg  today and 20mg  daily, and potassium supplement. BUN/Cr stable-continue to monitor. Continue Delsym  for cough prn -5/8 Will order echo- last one I see is from 2008?   5/9 check echocardiogram today, discussed with patient 5/10- pt's family was asking about ECHO- went over results again with pt on 2nd visit to speak with her.  Lasix  completed, K+ supplement complete 10. Chest wall pain: Likely due to CPR. Continue to encourage deep breathing/cough every hour  -5/8 lidocaine  patches ordered x2- providing mild benefit  11. Persistent leucocytosis: Monitor for fevers and signs of infection             --WBC has trended in 14-18 range.              -afebrile at present  -5/8 WBC down to 12.8  -5/12 improved to 11  WBC 10, stable Episode of Hematemesis/Nocturnal cough: Continues to have chest pain 12. Constipation: Has not had BM since last week?Aaron Aas Moderate stool noted in colon on X rays.  and relays abdominal fullness.               --  will add senna to colace              -does realize that her po intake needs to pick up as well  -5/8 multiple BMs over the past 24 hours, likely had buildup of stool  over the past several days, continue current regimen for now  5/9 DC senokot, lactaid  5/10- still having multiple loose stools- 3-4x/ yesterday- will monitor with liquid diet she' wants due to swallowing issues  5/11- started lactid TID with meals for pt  5/12 having large daily bowel movements  LBM 5/15, having regular bowel movements 13. Urinary retention:  Has voided twice since foley came out yesterday. Needs to drink more             --will monitor voiding with bladder scans.  -5/8 continent PVR 0 today  -5/9 remains continent, monitor  14. Pre-renal azotemia: Encourage fluid/po intake.   5/8 BUN and creatinine stable at 19/0.74  5/15 creatinine stable at 0.73 15. RA/Psoriatic arthritis: Reports diffuse pain for past month (due to need to hold meds pre-op)   --Has been cleared to resume Methotrexate  (today) and Simponi  (friend to bring in tomorrow)  16. LLE DVT's- multiple-  5/10- changed to SQ tx dose lovenox 90 mg BID- to be given on triceps or thighs- not abdomen per pt  request- since has incision and PEG.  5/13 continue Lovenox, consider transition to DOAC, monitor CBG 5/14 change to Abixaban, pharm consult- appreciate assistance.   Discussed with patient 75. Hx of HTN/Orthostatic hypotension  5/11- restarted Home Losartan  25 mg daily- not sure of home dose, but this appears like will handle BP elevation  5/12 BP stable/improved, continue current regimen and monitor   5/13 BP a little soft, questionable symptoms of orthostatic hypotension with standing.  Decrease losartan  to 12.5mg  twice daily  5/14 fair control, continue current regimen and monitor   5/15 diastolic blood pressure little low but overall stable, continue current regimen for now and follow-up with PCP      07/15/2023   10:07 AM 07/15/2023    4:33 AM 07/14/2023    7:56 PM  Vitals with BMI  Weight  213 lbs 14 oz   BMI  35.59   Systolic 147 141 119  Diastolic 57 54 52  Pulse 87 87 88        LOS: 9  days A FACE TO FACE EVALUATION WAS PERFORMED  Jade Cardenas 07/15/2023, 5:14 PM

## 2023-07-15 NOTE — TOC Benefit Eligibility Note (Addendum)
 Pharmacy Patient Advocate Encounter   Received notification from Inpatient Request that prior authorization for Lidocaine  5% patches is required/requested.   Insurance verification completed.   The patient is insured through [facility] .   Per test claim: PA required; PA submitted to above mentioned insurance via CoverMyMeds Key/confirmation #/EOC [facility] Status is pending    Received notification from [facility] that Prior Authorization for Lidocaine  5% patches has been Denied - This drug used for OTHER CHEST PAIN is not an approved use. Medicare Part D rules states the drug must be used for a "medically-accepted indication". A "medically-accepted indication" means a use that is approved by the Food and Drug Administration (FDA), or a use supported by specific resources. These are the [facility] Formulary Service Drug Information and DRUGDEX Information System. Therefore, this drug cannot be covered under your Medicare Part D benefit.   PA #/Case ID/Reference #: [id number]

## 2023-07-15 NOTE — Discharge Summary (Signed)
 Physician Discharge Summary  Patient ID: Jade Cardenas MRN: [id number] DOB/AGE: February 17, 1951 73 y.o.  Admit date: 07/06/2023 Discharge date: 07/15/2023  Discharge Diagnoses:  Principal Problem:   Debility Active Problems:   GERD   Iron deficiency   Psoriatic arthritis (HCC)   Fibromyalgia   Small fiber neuropathy   Acute blood loss anemia   DVT of axillary vein, acute bilateral (HCC)   Discharged Condition: stable  Significant Diagnostic Studies: VAS US  LOWER EXTREMITY VENOUS (DVT) Result Date: 07/10/2023  Lower Venous DVT Study Patient Name:  Jade Cardenas  Date of Exam:   07/09/2023 Medical Rec #: [id number]          Accession #:    [id number] Date of Birth: 27-Apr-1950          Patient Gender: F Patient Age:   17 years Exam Location:  [facility] Procedure:      VAS US  LOWER EXTREMITY VENOUS (DVT) Referring Phys: Assyria Morreale --------------------------------------------------------------------------------  Other Indications: History of venous reflux disease, diagnosed 10/01/2016. Risk Factors: Immobility Status post CPR X 2 during GI surgery. Comparison Study: No prior LEV on file Performing Technologist: Carleene Chase RVS  Examination Guidelines: A complete evaluation includes B-mode imaging, spectral Doppler, color Doppler, and power Doppler as needed of all accessible portions of each vessel. Bilateral testing is considered an integral part of a complete examination. Limited examinations for reoccurring indications may be performed as noted. The reflux portion of the exam is performed with the patient in reverse Trendelenburg.  +---------+---------------+---------+-----------+----------+--------------+ RIGHT    CompressibilityPhasicitySpontaneityPropertiesThrombus Aging +---------+---------------+---------+-----------+----------+--------------+ CFV      Full           Yes      Yes                                  +---------+---------------+---------+-----------+----------+--------------+ SFJ      Full                                                        +---------+---------------+---------+-----------+----------+--------------+ FV Prox  Full                                                        +---------+---------------+---------+-----------+----------+--------------+ FV Mid   Full                                                        +---------+---------------+---------+-----------+----------+--------------+ FV DistalFull                                                        +---------+---------------+---------+-----------+----------+--------------+ PFV      Full                                                        +---------+---------------+---------+-----------+----------+--------------+  POP      Full           Yes      Yes                                 +---------+---------------+---------+-----------+----------+--------------+ PTV      Partial                                      Acute          +---------+---------------+---------+-----------+----------+--------------+ PERO     Partial                                      Acute          +---------+---------------+---------+-----------+----------+--------------+   +---------+---------------+---------+-----------+----------+-----------------+ LEFT     CompressibilityPhasicitySpontaneityPropertiesThrombus Aging    +---------+---------------+---------+-----------+----------+-----------------+ CFV      Full                                                           +---------+---------------+---------+-----------+----------+-----------------+ SFJ      Full                                                           +---------+---------------+---------+-----------+----------+-----------------+ FV Prox  Full                                                            +---------+---------------+---------+-----------+----------+-----------------+ FV Mid   Full                                                           +---------+---------------+---------+-----------+----------+-----------------+ FV DistalPartial        No       No                   Age Indeterminate +---------+---------------+---------+-----------+----------+-----------------+ PFV      Full                                                           +---------+---------------+---------+-----------+----------+-----------------+ POP      Partial        No       No                   Age Indeterminate +---------+---------------+---------+-----------+----------+-----------------+ PTV      Full                                                           +---------+---------------+---------+-----------+----------+-----------------+  PERO     None                                         Acute             +---------+---------------+---------+-----------+----------+-----------------+     Summary: RIGHT: - Findings consistent with acute deep vein thrombosis involving the right posterior tibial veins, and right peroneal veins.  - No cystic structure found in the popliteal fossa.  LEFT: - Findings consistent with acute deep vein thrombosis involving the left peroneal veins.  - Findings consistent with age indeterminate deep vein thrombosis involving the distal left femoral vein, and left popliteal vein.  - No cystic structure found in the popliteal fossa.  *See table(s) above for measurements and observations. Electronically signed by Angela Kell MD on 07/10/2023 at 9:53:25 AM.    Final    ECHOCARDIOGRAM COMPLETE Result Date: 07/09/2023    ECHOCARDIOGRAM REPORT   Patient Name:   Jade Cardenas Date of Exam: 07/09/2023 Medical Rec #:  [id number]         Height:       65.0 in Accession #:    [id number]        Weight:       209.7 lb Date of Birth:  [birth year]         BSA:          2.018 m  Patient Age:    73 years          BP:           131/58 mmHg Patient Gender: F                 HR:           83 bpm. Exam Location:  Inpatient Procedure: 2D Echo, Cardiac Doppler and Color Doppler (Both Spectral and Color            Flow Doppler were utilized during procedure). Indications:    Congestive Heart Failure  History:        Patient has no prior history of Echocardiogram examinations.  Sonographer:    Janette Medley Referring Phys: [id number] YURI SHTRIDELMAN IMPRESSIONS  1. Left ventricular ejection fraction, by estimation, is 60 to 65%. The left ventricle has normal function. The left ventricle has no regional wall motion abnormalities. Left ventricular diastolic parameters are consistent with Grade I diastolic dysfunction (impaired relaxation).  2. Right ventricular systolic function is normal. The right ventricular size is normal.  3. The mitral valve is abnormal. Trivial mitral valve regurgitation. No evidence of mitral stenosis.  4. The aortic valve is tricuspid. There is mild calcification of the aortic valve. Aortic valve regurgitation is not visualized. Aortic valve sclerosis is present, with no evidence of aortic valve stenosis.  5. The inferior vena cava is normal in size with greater than 50% respiratory variability, suggesting right atrial pressure of 3 mmHg. FINDINGS  Left Ventricle: Left ventricular ejection fraction, by estimation, is 60 to 65%. The left ventricle has normal function. The left ventricle has no regional wall motion abnormalities. Strain was performed and the global longitudinal strain is indeterminate. The left ventricular internal cavity size was normal in size. There is no left ventricular hypertrophy. Left ventricular diastolic parameters are consistent with Grade I diastolic dysfunction (impaired relaxation). Right Ventricle: The right ventricular size is normal. No increase in right ventricular wall thickness. Right ventricular  systolic function is normal. Left Atrium: Left  atrial size was normal in size. Right Atrium: Right atrial size was normal in size. Pericardium: Trivial pericardial effusion is present. The pericardial effusion is surrounding the apex and anterior to the right ventricle. Mitral Valve: The mitral valve is abnormal. There is mild thickening of the mitral valve leaflet(s). There is mild calcification of the mitral valve leaflet(s). Mild mitral annular calcification. Trivial mitral valve regurgitation. No evidence of mitral valve stenosis. Tricuspid Valve: The tricuspid valve is normal in structure. Tricuspid valve regurgitation is not demonstrated. No evidence of tricuspid stenosis. Aortic Valve: The aortic valve is tricuspid. There is mild calcification of the aortic valve. Aortic valve regurgitation is not visualized. Aortic valve sclerosis is present, with no evidence of aortic valve stenosis. Pulmonic Valve: The pulmonic valve was normal in structure. Pulmonic valve regurgitation is not visualized. No evidence of pulmonic stenosis. Aorta: The aortic root is normal in size and structure. Venous: The inferior vena cava is normal in size with greater than 50% respiratory variability, suggesting right atrial pressure of 3 mmHg. IAS/Shunts: The interatrial septum was not well visualized. Additional Comments: 3D was performed not requiring image post processing on an independent workstation and was indeterminate.  LEFT VENTRICLE PLAX 2D LVIDd:         4.00 cm   Diastology LVIDs:         2.70 cm   LV e' medial:    7.29 cm/s LV PW:         0.90 cm   LV E/e' medial:  10.7 LV IVS:        1.10 cm   LV e' lateral:   11.10 cm/s LVOT diam:     2.00 cm   LV E/e' lateral: 7.0 LV SV:         56 LV SV Index:   28 LVOT Area:     3.14 cm  RIGHT VENTRICLE RV S prime:     12.80 cm/s TAPSE (M-mode): 2.1 cm LEFT ATRIUM             Index        RIGHT ATRIUM           Index LA diam:        3.20 cm 1.59 cm/m   RA Area:     13.90 cm LA Vol (A2C):   37.4 ml 18.53 ml/m  RA Volume:    34.50 ml  17.09 ml/m LA Vol (A4C):   40.3 ml 19.97 ml/m LA Biplane Vol: 41.0 ml 20.32 ml/m  AORTIC VALVE LVOT Vmax:   92.20 cm/s LVOT Vmean:  59.700 cm/s LVOT VTI:    0.178 m  AORTA Ao Root diam: 2.20 cm Ao Asc diam:  3.20 cm MITRAL VALVE MV Area (PHT): 4.15 cm     SHUNTS MV Decel Time: 183 msec     Systemic VTI:  0.18 m MV E velocity: 78.10 cm/s   Systemic Diam: 2.00 cm MV A velocity: 129.00 cm/s MV E/A ratio:  0.61 Janelle Mediate MD Electronically signed by Janelle Mediate MD Signature Date/Time: 07/09/2023/4:38:36 PM    Final    DG Chest 2 View Result Date: 07/08/2023 CLINICAL DATA:  Follow-up exam. EXAM: CHEST - 2 VIEW COMPARISON:  Chest radiograph dated 07/06/2023. FINDINGS: Small bilateral pleural effusions and right pericardial opacity similar to prior radiograph. No new consolidation. No pneumothorax. Stable cardiac silhouette. Degenerative changes spine. No acute osseous pathology. IMPRESSION: No interval change since the prior radiograph. Electronically  Signed   By: Arash  Radparvar M.D.   On: 07/08/2023 09:52   DG Chest 2 View Result Date: 07/06/2023 CLINICAL DATA:  [id number] Hemoptysis [id number] EXAM: CHEST - 2 VIEW COMPARISON:  07/03/2023 FINDINGS: Cardiac silhouette is prominent. There is pulmonary interstitial prominence with vascular congestion. No focal consolidation. No pneumothorax. Moderate bilateral pleural effusions. IMPRESSION: Findings suggest CHF. Electronically Signed   By: Sydell Eva M.D.   On: 07/06/2023 19:12    Labs:  Basic Metabolic Panel:    Latest Ref Rng & Units 07/15/2023    5:30 AM 07/12/2023    5:54 AM 07/08/2023    6:34 AM  BMP  Glucose 70 - 99 mg/dL 811  914  782   BUN 8 - 23 mg/dL 15  16  19    Creatinine 0.44 - 1.00 mg/dL 9.56  2.13  0.86   Sodium 135 - 145 mmol/L 136  138  137   Potassium 3.5 - 5.1 mmol/L 4.7  3.5  3.8   Chloride 98 - 111 mmol/L 103  104  103   CO2 22 - 32 mmol/L 23  25  25    Calcium  8.9 - 10.3 mg/dL 8.9  8.6  8.6      CBC: Recent Labs   Lab 07/12/23 0554 07/13/23 0540 07/14/23 1331 07/15/23 0530  WBC 11.0*  --  10.0 10.0  HGB 11.9* 11.4* 11.8* 11.6*  HCT 35.9* 35.1* 37.2 35.3*  MCV 91.6  --  93.0 93.9  PLT 660*  --  714* 532*    Liver Function test:    Latest Ref Rng & Units 07/09/2023    7:45 AM 07/08/2023    6:34 AM 07/07/2023    5:10 AM  Hepatic Function  Total Protein 6.5 - 8.1 g/dL 6.1  5.6  5.4   Albumin  3.5 - 5.0 g/dL 3.0  2.9  2.6   AST 15 - 41 U/L 21  20  26    ALT 0 - 44 U/L 30  34  43   Alk Phosphatase 38 - 126 U/L 71  70  67   Total Bilirubin 0.0 - 1.2 mg/dL 1.4  1.6  1.5   Bilirubin, Direct 0.0 - 0.2 mg/dL 0.2  0.3      CBG: Recent Labs  Lab 07/08/23 1127 07/08/23 1659  GLUCAP 135* 134*    Brief HPI:   Jade Cardenas is a 73 y.o. female with history of HTN, PUD, chronic LBP, RA, psoriatic arthritis, large HH with Cameron's ulcers, iron deficiency anemia and GERD who was admitted on 06/29/23 for attempts at Lap fundoplication by Dr. Melton Squires. Operative course complicated by significant bleeding in mid portion of stomach after stomach reduced and hiatus dissected. She required 2 rounds CPR due to cardiac arrest, laparotomy with ligation of splenic artery, 5 units PRBC and gastric tube placement.   She tolerated extubation but hospital course significant for urinary retention requiring foley, recurrent drop in H/H requiring 4 additional units and leucocytosis felt to be due to UTI. Oxygen was weaned off but she continues to be limited by chest pain, poor appetite as well as weakness. Therapy has been working with  patient who requires mod assist with ADLs and min to mod assist with mobility. She was independent PTA and CIR recommended due to functional decline.    Hospital Course: Jade Cardenas was admitted to rehab 07/06/2023 for inpatient therapies to consist of PT  and OT at least three hours five days a week. Past admission  physiatrist, therapy team and rehab RN have worked together to provide  customized collaborative inpatient rehab. Home dose Methotrexate  resumed on 05/06 and Symponi were resumed on 05/07. She reported chest pain as well as persistent cough at  admission. CXR done showed evidence of fluid overload and lasix  X 5 days added for diuresis. 2 D echo ordered for work up and showed EF 60-655 with G1DD and normal LVD. CBG were monitored briefly due to reports of hypoglycemia and d/c per patient request.   Lidocaine  patch also added to help with chest wall pain secondary to CPR and tramadol  was d/c as ineffective. BLE dopplers added due to LE edema and immobility and was positive for DVT in left peroneal vein, age indeterminate DVT left femoral and left popliteal veins as well as DVTs in right posterior tibial and right peroneal veins.  She was started on heparin  and transitioned to  Eliquis  by discharge. Serial check of CBC showed H/H to be stable. Pain is controlled with prn use of oxycodone .   Her blood pressures were monitored on TID basis and has been stable. Constipation has resolved and laxative were discontinued. T. Bili remains elevated and without new GI symptoms. She reported ongoing issue with dyspepsia and Carafate  was added to help with symptoms. Lactaid also added due to reports of milk intolerance since surgery.  Abdominal incision is C/D/I and healing well. Staples were removed by surgeon on 05/14. Po intake has improved but sleep wake disruption continues to be an issue. Anticipate that this will improve once back in home setting. She has made good gains during her stay and is modified independent in supervised setting. She will continue to receive follow up HHPT and HHOT by [facility] after discharge.    Rehab course: During patient's stay in rehab team conference was held to monitor patient's progress, set goals and discuss barriers to discharge. At admission, patient required max assist with ADL tasks and min assist with mobility. She has had improvement  in activity tolerance, balance, postural control as well as ability to compensate for deficits.  She is able to complete ADL tasks at modified independent level. She is independent for transfers and is able to ambulate 171 feet with use of rollater. She is able to one step independently with sue of SPC or RW. She has friends who will check in after discharge     Disposition:  Home w/home health.   Diet: Regular.   Special Instructions: No driving or strenuous activity till cleared by MD.  2.  Recommend follow up CBC in 1-2 weeks to monitor H/H.    Allergies as of 07/15/2023       Reactions   Erythromycin Hives, Swelling, Other (See Comments)   All Mycins; throat swells shut and she gets "really hot"   Iodinated Contrast Media Shortness Of Breath, Swelling   CT contrast; throat swelling, difficulty breathing.  In [place]  "many years ago."   Ace Inhibitors Cough   Indomethacin Other (See Comments)   Keflex [cephalexin] Hives   Macrobid  [nitrofurantoin ] Other (See Comments)   dizziness        Medication List     STOP taking these medications    acetaminophen  650 MG CR tablet Commonly known as: TYLENOL  Replaced by: acetaminophen  325 MG tablet   lovastatin  20 MG tablet Commonly known as: MEVACOR        TAKE these medications    acetaminophen  325 MG tablet Commonly known as: TYLENOL  Take 2 tablets (650 mg total)  by mouth every 6 (six) hours. Replaces: acetaminophen  650 MG CR tablet   B-D ALLERGY  SYRINGE 1CC/28G 28G X 1/2" 1 ML Misc Generic drug: Tuberculin-Allergy  Syringes USE AS DIRECTED ONCE WEEKLY   bethanechol  10 MG tablet Commonly known as: URECHOLINE  Take 1 tablet (10 mg total) by mouth 3 (three) times daily. Notes to patient: For bladder function.    CALCIUM +D3 PO Take 1 tablet by mouth daily.   carboxymethylcellulose 0.5 % Soln Commonly known as: REFRESH PLUS Place 1 drop into both eyes as needed (dry eyes).   diphenhydramine -acetaminophen  25-500 MG  Tabs tablet Commonly known as: TYLENOL  PM Take 1 tablet by mouth at bedtime.   Eliquis  5 MG Tabs tablet Generic drug: apixaban  Take two tablets by mouth on 05/15 pm and 05/16 am. Then decrease to one tablet twice a day.   famotidine  40 MG tablet Commonly known as: PEPCID  Take 1 tablet (40 mg total) by mouth daily.   folic acid  1 MG tablet Commonly known as: FOLVITE  Take 1 tablet (1 mg total) by mouth daily.   lactase 3000 units tablet Commonly known as: LACTAID Take 2 tablets (6,000 Units total) by mouth 3 (three) times daily with meals.   lidocaine  5 % Commonly known as: LIDODERM  Place 2 patches onto the skin daily. Remove & Discard patch within 12 hours or as directed by MD   loperamide  2 MG capsule Commonly known as: IMODIUM  Take 1 capsule (2 mg total) by mouth as needed for diarrhea or loose stools.   losartan  25 MG tablet Commonly known as: COZAAR  Take 0.5 tablets (12.5 mg total) by mouth daily. What changed:  medication strength how much to take   methocarbamol  500 MG tablet Commonly known as: ROBAXIN  Take 1 tablet (500 mg total) by mouth 4 (four) times daily. What changed:  how to take this when to take this Notes to patient: Decrease to three times a day after a week. And slowly taper off as chest wall pain improves   methotrexate  50 MG/2ML injection INJECT 0.3ML INTO THE SKIN ONCE WEEKLY What changed: See the new instructions.   MULTIVITAMIN ADULTS PO Take 1 tablet by mouth daily.   oxyCODONE  5 MG immediate release tablet---Rx #  Commonly known as: Oxy IR/ROXICODONE  Take 1 tablet (5 mg total) by mouth every 6 (six) hours as needed for severe pain (pain score 7-10). Notes to patient: Limit to one pill twice a day AS NEEDED for SEVERE pain.  Call Dr. Melton Squires for refills.   pantoprazole  40 MG tablet Commonly known as: PROTONIX  Take 40 mg by mouth 2 (two) times daily.   Simponi  50 MG/0.5ML Soaj Generic drug: Golimumab  INJECT 50MG  UNDER THE SKIN ONCE  MONTHLY Notes to patient: Last dose given on 07/09/23   sucralfate  1 g tablet Commonly known as: CARAFATE  Take 1 tablet (1 g total) by mouth 3 (three) times daily before meals.   traZODone  50 MG tablet Commonly known as: DESYREL  Take 1 tablet (50 mg total) by mouth at bedtime.        Follow-up Information     Panosh, Joaquim Muir, MD Follow up.   Specialties: Internal Medicine, Pediatrics Why: Call in 1-2 days for post hospital follow up Contact information: [address] [place] [address] [phone]         Shela Derby, MD Follow up.   Specialty: General Surgery Why: Call in 1-2 days for post hospital follow up Contact information: [address] [place] [address] [phone]  Lylia Sand, MD. Call.   Specialty: Physical Medicine and Rehabilitation Why: As needed Contact information: [address] [place] [address] [phone]                 Signed: Zelda Hickman 07/19/2023, 2:50 PM

## 2023-07-15 NOTE — Progress Notes (Addendum)
 Inpatient Rehabilitation Discharge Medication Review by a Pharmacist  A complete drug regimen review was completed for this patient to identify any potential clinically significant medication issues.  High Risk Drug Classes Is patient taking? Indication by Medication  Antipsychotic No   Anticoagulant Yes Apixaban -DVTs  Antibiotic No   Opioid Yes OxyIR- severe pain  Antiplatelet No   Hypoglycemics/insulin  No   Vasoactive Medication Yes Losartan - HTN  Chemotherapy Yes, Chemotherapy by other route Methotrexate  IV- Psoriatic Arthritis  Other Yes Golimumab  [place]- Psoriatic arthritis Acetaminophen , lidocaine  patch- pain Loperamide -prn diarrhea Robaxin - muscle spasms Protonix -, famotidine- GERD Sucralfate- GERD symptoms Trazodine, Tylenol  PM - sleep Urecholine - neurogenic bladder urinary retention      Type of Medication Issue Identified Description of Issue Recommendation(s)  Drug Interaction(s) (clinically significant)     Duplicate Therapy     Allergy      No Medication Administration End Date     Incorrect Dose     Additional Drug Therapy Needed     Significant med changes from prior encounter (inform family/care partners about these prior to discharge).    Other       Clinically significant medication issues were identified that warrant physician communication and completion of prescribed/recommended actions by midnight of the next day:  No   Time spent performing this drug regimen review (minutes): 30   Alisa Irish, [place] Clinical Pharmacist 07/15/2023 8:53 AM

## 2023-07-15 NOTE — Progress Notes (Signed)
 Patient discharged to personal residence with friend assisting. IV removed. Belongings brought down for patient. TOC medications picked up. No further questions or concerns by patient.

## 2023-07-15 NOTE — Telephone Encounter (Signed)
 Please make her appt for either may 21 or may 28 as hospital follow u p ( I will be out of office  first 2 weeks of JUne)

## 2023-07-15 NOTE — Progress Notes (Signed)
 Inpatient Rehabilitation Discharge Medication Review by a Pharmacist  A complete drug regimen review was completed for this patient to identify any potential clinically significant medication issues.  High Risk Drug Classes Is patient taking? Indication by Medication  Antipsychotic No   Anticoagulant Yes Apixaban -DVTs  Antibiotic No   Opioid Yes OxyIR- severe pain  Antiplatelet No   Hypoglycemics/insulin  No   Vasoactive Medication Yes Losartan - HTN  Chemotherapy Yes, Chemotherapy by other route Methotrexate  IV- Psoriatic Arthritis  Other Yes Golimumab  [place]- Psoriatic arthritis Acetaminophen , lidocaine  patch- pain Loperamide -prn diarrhea Robaxin - muscle spasms Protonix -, famotidine- GERD Sucralfate-  GERD - GERD symptoms Trazodine, Tylenol  PM - sleep Urecholine - neurogenic bladder urinary retention      Type of Medication Issue Identified Description of Issue Recommendation(s)  Drug Interaction(s) (clinically significant)     Duplicate Therapy     Allergy      No Medication Administration End Date     Incorrect Dose     Additional Drug Therapy Needed     Significant med changes from prior encounter (inform family/care partners about these prior to discharge).    Other       Clinically significant medication issues were identified that warrant physician communication and completion of prescribed/recommended actions by midnight of the next day:  No   Time spent performing this drug regimen review (minutes): 30   Alisa Irish, [place] Clinical Pharmacist 07/15/2023 12:45 PM

## 2023-07-16 NOTE — Telephone Encounter (Signed)
 Spoke with the patient and scheduled an appt on 5/21 at 9:45am.

## 2023-07-18 DIAGNOSIS — I4510 Unspecified right bundle-branch block: Secondary | ICD-10-CM | POA: Diagnosis not present

## 2023-07-18 DIAGNOSIS — K254 Chronic or unspecified gastric ulcer with hemorrhage: Secondary | ICD-10-CM | POA: Diagnosis not present

## 2023-07-18 DIAGNOSIS — K219 Gastro-esophageal reflux disease without esophagitis: Secondary | ICD-10-CM | POA: Diagnosis not present

## 2023-07-18 DIAGNOSIS — G4700 Insomnia, unspecified: Secondary | ICD-10-CM | POA: Diagnosis not present

## 2023-07-18 DIAGNOSIS — G608 Other hereditary and idiopathic neuropathies: Secondary | ICD-10-CM | POA: Diagnosis not present

## 2023-07-18 DIAGNOSIS — L4050 Arthropathic psoriasis, unspecified: Secondary | ICD-10-CM | POA: Diagnosis not present

## 2023-07-18 DIAGNOSIS — G8929 Other chronic pain: Secondary | ICD-10-CM | POA: Diagnosis not present

## 2023-07-18 DIAGNOSIS — D509 Iron deficiency anemia, unspecified: Secondary | ICD-10-CM | POA: Diagnosis not present

## 2023-07-18 DIAGNOSIS — M8589 Other specified disorders of bone density and structure, multiple sites: Secondary | ICD-10-CM | POA: Diagnosis not present

## 2023-07-18 DIAGNOSIS — M4802 Spinal stenosis, cervical region: Secondary | ICD-10-CM | POA: Diagnosis not present

## 2023-07-18 DIAGNOSIS — K589 Irritable bowel syndrome without diarrhea: Secondary | ICD-10-CM | POA: Diagnosis not present

## 2023-07-18 DIAGNOSIS — M47812 Spondylosis without myelopathy or radiculopathy, cervical region: Secondary | ICD-10-CM | POA: Diagnosis not present

## 2023-07-18 DIAGNOSIS — M797 Fibromyalgia: Secondary | ICD-10-CM | POA: Diagnosis not present

## 2023-07-18 DIAGNOSIS — I739 Peripheral vascular disease, unspecified: Secondary | ICD-10-CM | POA: Diagnosis not present

## 2023-07-18 DIAGNOSIS — M51369 Other intervertebral disc degeneration, lumbar region without mention of lumbar back pain or lower extremity pain: Secondary | ICD-10-CM | POA: Diagnosis not present

## 2023-07-18 DIAGNOSIS — I469 Cardiac arrest, cause unspecified: Secondary | ICD-10-CM | POA: Diagnosis not present

## 2023-07-18 DIAGNOSIS — I10 Essential (primary) hypertension: Secondary | ICD-10-CM | POA: Diagnosis not present

## 2023-07-18 DIAGNOSIS — M170 Bilateral primary osteoarthritis of knee: Secondary | ICD-10-CM | POA: Diagnosis not present

## 2023-07-18 DIAGNOSIS — I82A13 Acute embolism and thrombosis of axillary vein, bilateral: Secondary | ICD-10-CM | POA: Diagnosis not present

## 2023-07-18 DIAGNOSIS — E611 Iron deficiency: Secondary | ICD-10-CM | POA: Diagnosis not present

## 2023-07-18 DIAGNOSIS — M069 Rheumatoid arthritis, unspecified: Secondary | ICD-10-CM | POA: Diagnosis not present

## 2023-07-18 DIAGNOSIS — I83893 Varicose veins of bilateral lower extremities with other complications: Secondary | ICD-10-CM | POA: Diagnosis not present

## 2023-07-18 DIAGNOSIS — M48062 Spinal stenosis, lumbar region with neurogenic claudication: Secondary | ICD-10-CM | POA: Diagnosis not present

## 2023-07-18 DIAGNOSIS — M549 Dorsalgia, unspecified: Secondary | ICD-10-CM | POA: Diagnosis not present

## 2023-07-18 DIAGNOSIS — D62 Acute posthemorrhagic anemia: Secondary | ICD-10-CM | POA: Diagnosis not present

## 2023-07-19 DIAGNOSIS — D62 Acute posthemorrhagic anemia: Secondary | ICD-10-CM | POA: Insufficient documentation

## 2023-07-19 DIAGNOSIS — I82A13 Acute embolism and thrombosis of axillary vein, bilateral: Secondary | ICD-10-CM | POA: Insufficient documentation

## 2023-07-20 ENCOUNTER — Telehealth: Payer: Self-pay

## 2023-07-20 NOTE — Telephone Encounter (Signed)
 Copied from CRM [id number]. Topic: General - Other >> Jul 20, 2023  1:06 PM Dewanda Foots wrote: Reason for CRM: Avery Bodo from [facility] was calling to follow up on the verbal orders for home health that was requested on 07/19/23. Please give her a call back when you can.  Ka[facility]'s phone number is - [phone]

## 2023-07-20 NOTE — Telephone Encounter (Signed)
 Copied from CRM [id number]. Topic: Clinical - Home Health Verbal Orders >> Jul 19, 2023  9:50 AM Clydene Darner H wrote: Caller/Agency: [facility] Callback Number: [phone] Service Requested: Physical Therapy Frequency: 3 times a week for 1 week, twice a week for 3 weeks  Any new concerns about the patient? No

## 2023-07-21 ENCOUNTER — Encounter: Payer: Self-pay | Admitting: Internal Medicine

## 2023-07-21 ENCOUNTER — Ambulatory Visit (INDEPENDENT_AMBULATORY_CARE_PROVIDER_SITE_OTHER): Admitting: Internal Medicine

## 2023-07-21 ENCOUNTER — Ambulatory Visit: Payer: Medicare Other | Admitting: Rheumatology

## 2023-07-21 ENCOUNTER — Other Ambulatory Visit: Payer: Self-pay

## 2023-07-21 VITALS — BP 130/82 | HR 88 | Temp 97.6°F | Ht 65.0 in | Wt 200.6 lb

## 2023-07-21 DIAGNOSIS — R1310 Dysphagia, unspecified: Secondary | ICD-10-CM

## 2023-07-21 DIAGNOSIS — D649 Anemia, unspecified: Secondary | ICD-10-CM | POA: Diagnosis not present

## 2023-07-21 DIAGNOSIS — E7849 Other hyperlipidemia: Secondary | ICD-10-CM

## 2023-07-21 DIAGNOSIS — Z09 Encounter for follow-up examination after completed treatment for conditions other than malignant neoplasm: Secondary | ICD-10-CM | POA: Diagnosis not present

## 2023-07-21 DIAGNOSIS — Z79899 Other long term (current) drug therapy: Secondary | ICD-10-CM | POA: Diagnosis not present

## 2023-07-21 DIAGNOSIS — K449 Diaphragmatic hernia without obstruction or gangrene: Secondary | ICD-10-CM | POA: Diagnosis not present

## 2023-07-21 DIAGNOSIS — I10 Essential (primary) hypertension: Secondary | ICD-10-CM | POA: Diagnosis not present

## 2023-07-21 DIAGNOSIS — K219 Gastro-esophageal reflux disease without esophagitis: Secondary | ICD-10-CM | POA: Diagnosis not present

## 2023-07-21 DIAGNOSIS — Z7901 Long term (current) use of anticoagulants: Secondary | ICD-10-CM | POA: Diagnosis not present

## 2023-07-21 NOTE — Telephone Encounter (Signed)
 Attempted to reach [employer]. Left a detail message that provider voiced approval for the VO. To call us  back if have any questions.    Contact [facility] and spoke to [facility], Development worker, international aid. Ask her if we can place an order for a nurse to get cbc w/ for anemia on pt next week. She took the order as speaking on the phone. They will have a nurse comes on  Wednesday on 07/28/2023.  Forwarding to provider FYI.

## 2023-07-21 NOTE — Progress Notes (Signed)
 Chief Complaint  Patient presents with   Hospitalization Follow-up    Pt is here with friend, [name]. To follow on    Diarrhea    Pt c/o diarrhea. Off and on since hospital.    Nausea    Pt c/o nausea from hospital. Has vomit yesterday and today.    Medical Management of Chronic Issues    Pt would like to discuss cholesterol and BP med-Losartan  was reduce to 12.5mg  from 25mg .    Dysphagia    Pt c/o swallowing medication-carafate .    Depression    HPI: Jade Cardenas 73 y.o. come in for fu after dc from rehabe after surgically complicated  HH repair with intraoperative bleeding cause ing shock  short term cpr and emergent transfusion.  And emergency laparotomy Dvt was  dx in rehab and now on Doac  currently no bleeding  No cp sob but very tired and weepy   Since dc last week  at home neighbor  friend is sure and neighborhood community helping  Hard to swallow pills  and get stuck  .  Ad then vomit  basically the  robaxin  she has stopped since dc and the carafate  large pills  Soups and soft. Diet  Post hospital fatigue  and weepiness present but cognitive intact  Has peg in abd  and fu surgery next week.  Pain tylenol  and weaning oxycodone  to about 2 bid  HH  had PT  on Sunday  and to set up visits.  And OT  for a month . ? Name of agency and orders pending ? Has walker  Carafate   too large .  A pills Methocarbamol    told to decrease .  Asks if should go back on  Lovastatin   Had private duty nurse  for 3 nights.  Bp med reduced in hosp now on 12.5 losartan    bp at home is 140 range   Diarrhea  on and off .  Given immodium   .   With colace once a day : Gi team  [facility] is not currently involved . Taking  ppi bid and mid day pepcid   still gets some HB  at times .  Trazodone  seems to help rest at night   ROS: See pertinent positives and negatives per HPI.  Past Medical History:  Diagnosis Date   Anemia    iron def   Arthritis    rheumatoid, psoriatic, osteoarthritis    Bone  spur    RIGHT FOOT    Broken toe    Bruises easily    Cellulitis    Chronic back pain    COLONIC POLYPS, HX OF 02/09/2007   Cough, persistent 01/21/2012   poss from acei    DES exposure in utero, unknown    Femur fracture, left (HCC)    Fibromyalgia    doesn't require meds   GASTRIC ULCER, ACUTE, HEMORRHAGE, HX OF 02/09/2007   GERD (gastroesophageal reflux disease)    takes Nexium  daily   Goiter    H/O hiatal hernia    H/O: hysterectomy    when 73 yrs old   Headache    Occasional migraines with increased stress   Hx of seasonal allergies    takes Claritin  daily   HYPERLIPIDEMIA 12/29/2006   takes Lovastatin  nightly   HYPERTENSION 12/29/2006   takes Enalapril  daily   IBS 04/28/2007   Insomnia    related to pain;takes Flexeril  and Tylenol  PM nightly   Joint pain  Joint swelling    Neuropathy    small cell   Osteopenia    Palpitations 02/09/2007   PONV (postoperative nausea and vomiting)    NAUSEA   REDUCTION MAMMOPLASTY, HX OF 02/09/2007   Right bundle branch block 02/09/2007   S/P lumbar fusion 6 13 09/03/2011   L4 L5  posterior    SYNCOPE 12/07/2007   Tendonitis    RLE    UNS ADVRS EFF OTH RX MEDICINAL\T\BIOLOGICAL SBSTNC 02/09/2007    Family History  Problem Relation Age of Onset   Diabetes Mother    Colon cancer Mother        x2   Arthritis Mother    Hypertension Mother    Endometrial cancer Mother    Stroke Father    Heart disease Father    Colon cancer Father    Arthritis Brother    Hypertension Brother    Hyperlipidemia Brother    Infertility Brother    Diabetes Brother    Other Other        DISH brother   Hypertension Maternal Grandmother    Stroke Maternal Grandmother    Anesthesia problems Neg Hx    Hypotension Neg Hx    Malignant hyperthermia Neg Hx    Pseudochol deficiency Neg Hx     Social History   Socioeconomic History   Marital status: Widowed    Spouse name: Not on file   Number of children: 0   Years of education: Not  on file   Highest education level: Master's degree (e.g., MA, MS, MEng, MEd, MSW, MBA)  Occupational History   Not on file  Tobacco Use   Smoking status: Never    Passive exposure: Never   Smokeless tobacco: Never  Vaping Use   Vaping status: Never Used  Substance and Sexual Activity   Alcohol use: No    Alcohol/week: 0.0 standard drinks of alcohol   Drug use: No   Sexual activity: Not Currently    Partners: Male    Birth control/protection: Surgical    Comment: TAH/BSO  Other Topics Concern   Not on file  Social History Narrative   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  Husband died suddenly 2010Non smoker         Left handed    Lives in a one story home          Social Drivers of Health   Financial Resource Strain: Medium Risk (05/08/2023)   Overall Financial Resource Strain (CARDIA)    Difficulty of Paying Living Expenses: Somewhat hard  Food Insecurity: Patient Unable To Answer (06/29/2023)   Hunger Vital Sign    Worried About Running Out of Food in the Last Year: Patient unable to answer    Ran Out of Food in the Last Year: Patient unable to answer  Transportation Needs: Patient Unable To Answer (06/29/2023)   PRAPARE - Transportation    Lack of Transportation (Medical): Patient unable to answer    Lack of Transportation (Non-Medical): Patient unable to answer  Physical Activity: Insufficiently Active (05/08/2023)   Exercise Vital Sign    Days of Exercise per Week: 7 days    Minutes of Exercise per Session: 20 min  Stress: Stress Concern Present (05/08/2023)   [employer] of Occupational Health - Occupational Stress Questionnaire    Feeling of Stress : To some extent  Social Connections: Patient Unable To Answer (06/29/2023)   Social Connection and Isolation Panel [NHANES]  Frequency of Communication with Friends and Family: Patient unable to answer    Frequency of Social Gatherings with Friends and  Family: Patient unable to answer    Attends Religious Services: Patient unable to answer    Active Member of Clubs or Organizations: Patient unable to answer    Attends Banker Meetings: Patient unable to answer    Marital Status: Patient unable to answer    Outpatient Medications Prior to Visit  Medication Sig Dispense Refill   acetaminophen  (TYLENOL ) 325 MG tablet Take 2 tablets (650 mg total) by mouth every 6 (six) hours.     apixaban  (ELIQUIS ) 5 MG TABS tablet Take two tablets by mouth on 05/15 pm and 05/16 am. Then decrease to one tablet twice a day. 64 tablet 0   B-D ALLERGY  SYRINGE 1CC/28G 28G X 1/2" 1 ML MISC USE AS DIRECTED ONCE WEEKLY 12 each 3   bethanechol  (URECHOLINE ) 10 MG tablet Take 1 tablet (10 mg total) by mouth 3 (three) times daily. 90 tablet 0   Calcium  Carb-Cholecalciferol  (CALCIUM +D3 PO) Take 1 tablet by mouth daily.     carboxymethylcellulose (REFRESH PLUS) 0.5 % SOLN Place 1 drop into both eyes as needed (dry eyes).     diphenhydramine -acetaminophen  (TYLENOL  PM) 25-500 MG TABS tablet Take 1 tablet by mouth at bedtime.     famotidine  (PEPCID ) 40 MG tablet Take 1 tablet (40 mg total) by mouth daily. 30 tablet 0   folic acid  (FOLVITE ) 1 MG tablet Take 1 tablet (1 mg total) by mouth daily. 90 tablet 3   loperamide  (IMODIUM ) 2 MG capsule Take 1 capsule (2 mg total) by mouth as needed for diarrhea or loose stools. 30 capsule 0   losartan  (COZAAR ) 25 MG tablet Take 0.5 tablets (12.5 mg total) by mouth daily. 15 tablet 0   methotrexate  50 MG/2ML injection INJECT 0.3ML INTO THE SKIN ONCE WEEKLY (Patient taking differently: Inject 7.5 mg into the skin once a week. INJECT 0.3ML INTO THE SKIN ONCE WEEKLY) 6 mL 0   Multiple Vitamins-Minerals (MULTIVITAMIN ADULTS PO) Take 1 tablet by mouth daily.      oxyCODONE  (OXY IR/ROXICODONE ) 5 MG immediate release tablet Take 1 tablet (5 mg total) by mouth every 6 (six) hours as needed for severe pain (pain score 7-10). 30 tablet 0    pantoprazole  (PROTONIX ) 40 MG tablet Take 40 mg by mouth 2 (two) times daily.     SIMPONI  50 MG/0.5ML SOAJ INJECT 50MG  UNDER THE SKIN ONCE MONTHLY (Patient taking differently: Inject 50 mg into the skin every 30 (thirty) days. INJECT 50MG  UNDER THE SKIN ONCE MONTHLY) 0.5 mL 4   sucralfate  (CARAFATE ) 1 g tablet Take 1 tablet (1 g total) by mouth 3 (three) times daily before meals. 60 tablet 0   traZODone  (DESYREL ) 50 MG tablet Take 1 tablet (50 mg total) by mouth at bedtime. 30 tablet 0   lidocaine  (LIDODERM ) 5 % Place 2 patches onto the skin daily. Remove & Discard patch within 12 hours or as directed by MD (Patient not taking: Reported on 07/21/2023) 60 patch 0   lactase (LACTAID) 3000 units tablet Take 2 tablets (6,000 Units total) by mouth 3 (three) times daily with meals. 100 tablet 0   methocarbamol  (ROBAXIN ) 500 MG tablet Take 1 tablet (500 mg total) by mouth 4 (four) times daily. (Patient not taking: Reported on 07/21/2023) 75 tablet 0   No facility-administered medications prior to visit.     EXAM:  BP 130/82 (BP Location: Left  Arm, Patient Position: Sitting, Cuff Size: Large)   Pulse 88   Temp 97.6 F (36.4 C) (Oral)   Ht 5\' 5"  (1.651 m)   Wt 200 lb 9.6 oz (91 kg)   LMP 03/02/1984   SpO2 97%   BMI 33.38 kg/m   Body mass index is 33.38 kg/m.  GENERAL: vitals reviewed and listed above, alert, oriented, appears well hydrated and in no acute distress  walker   nl speech  HEENT: atraumatic, conjunctiva  clear, no obvious abnormalities on inspection of external nose and ears  NECK: no obvious masses on inspection palpation  LUNGS: clear to auscultation bilaterally, no wheezes, rales or rhonchi, good air movement CV: HRRR, no clubbing cyanosis or  peripheral edema nl cap refill  Abd sitting bandage  and peg left side  small  amount of drainage  MS: moves all extremitiesambulatory PSYCH: pleasant and cooperative,  Skin no active bruising  petechia  Lab Results  Component Value  Date   WBC 10.0 07/15/2023   HGB 11.6 (L) 07/15/2023   HCT 35.3 (L) 07/15/2023   PLT 532 (H) 07/15/2023   GLUCOSE 121 (H) 07/15/2023   CHOL 181 05/03/2023   TRIG 140 06/30/2023   HDL 70.60 05/03/2023   LDLDIRECT 114.0 04/02/2010   LDLCALC 82 05/03/2023   ALT 30 07/09/2023   AST 21 07/09/2023   NA 136 07/15/2023   K 4.7 07/15/2023   CL 103 07/15/2023   CREATININE 0.73 07/15/2023   BUN 15 07/15/2023   CO2 23 07/15/2023   TSH 1.28 05/03/2023   INR 1.1 07/05/2023   HGBA1C 5.8 05/03/2023   BP Readings from Last 3 Encounters:  07/21/23 130/82  07/15/23 (!) 147/57  07/06/23 (!) 149/62    ASSESSMENT AND PLAN:  Discussed the following assessment and plan:  Hospital discharge follow-up  Essential hypertension  Medication management  Other hyperlipidemia  Anemia, unspecified type - from blood loss on precedent iron deficiency  Chronic anticoagulation - in hosp dvt  Hiatal hernia  Gastroesophageal reflux disease, unspecified whether esophagitis present  Dysphagia, unspecified type At this time  Cont hh  Plan fu anemia check in a week or so  Monitor bp readings  consider increase dose to 12.5 bid or 25 per day  if indicated Will reach out to [facility] to evaluate meds  Dc robaxin   Uncertain about help with carafate  but discuss  reason for this med   consider slurry or other if no se. Surgical fu  and pain control  let us  know if we need to help advise .  See   advice  under pat instructions  -Patient advised to return or notify health care team  if  new concerns arise.  Patient Instructions  Will need to get GI team involved. and FU .  Agree with PT OT  Home health.  Plan :  have home health  do blood count   in another week  ( we should send an order )  Agree with going off robaxin  . Can wait on restarting  the lovastatin  . If too many pills .    Agree with decrease .  12.5  Losartan  to avoid hypotension and we can always go back up on meds.   Send in  some readings next week. Surgical pain   meds should be ordered by surgical team..  Will have [facility]  contact you about meds.     Alainna Stawicki K. Mollye Guinta M.D.

## 2023-07-21 NOTE — Telephone Encounter (Signed)
 Agree  with pt ot and hh orders

## 2023-07-21 NOTE — Patient Instructions (Addendum)
 Will need to get GI team involved. and FU .  Agree with PT OT  Home health.  Plan :  have home health  do blood count   in another week  ( we should send an order )  Agree with going off robaxin  . Can wait on restarting  the lovastatin  . If too many pills .    Agree with decrease .  12.5  Losartan  to avoid hypotension and we can always go back up on meds.   Send in some readings next week. Surgical pain   meds should be ordered by surgical team..  Will have [facility]  contact you about meds.

## 2023-07-22 DIAGNOSIS — I469 Cardiac arrest, cause unspecified: Secondary | ICD-10-CM | POA: Diagnosis not present

## 2023-07-22 DIAGNOSIS — G8929 Other chronic pain: Secondary | ICD-10-CM | POA: Diagnosis not present

## 2023-07-22 DIAGNOSIS — I82A13 Acute embolism and thrombosis of axillary vein, bilateral: Secondary | ICD-10-CM | POA: Diagnosis not present

## 2023-07-22 DIAGNOSIS — I10 Essential (primary) hypertension: Secondary | ICD-10-CM | POA: Diagnosis not present

## 2023-07-22 DIAGNOSIS — M549 Dorsalgia, unspecified: Secondary | ICD-10-CM | POA: Diagnosis not present

## 2023-07-22 DIAGNOSIS — I739 Peripheral vascular disease, unspecified: Secondary | ICD-10-CM | POA: Diagnosis not present

## 2023-07-22 NOTE — Telephone Encounter (Signed)
 Voicemail was sent for Jade Cardenas yesterday.

## 2023-07-23 DIAGNOSIS — I739 Peripheral vascular disease, unspecified: Secondary | ICD-10-CM | POA: Diagnosis not present

## 2023-07-23 DIAGNOSIS — M549 Dorsalgia, unspecified: Secondary | ICD-10-CM | POA: Diagnosis not present

## 2023-07-23 DIAGNOSIS — I82A13 Acute embolism and thrombosis of axillary vein, bilateral: Secondary | ICD-10-CM | POA: Diagnosis not present

## 2023-07-23 DIAGNOSIS — I10 Essential (primary) hypertension: Secondary | ICD-10-CM | POA: Diagnosis not present

## 2023-07-23 DIAGNOSIS — G8929 Other chronic pain: Secondary | ICD-10-CM | POA: Diagnosis not present

## 2023-07-23 DIAGNOSIS — I469 Cardiac arrest, cause unspecified: Secondary | ICD-10-CM | POA: Diagnosis not present

## 2023-07-26 DIAGNOSIS — M549 Dorsalgia, unspecified: Secondary | ICD-10-CM | POA: Diagnosis not present

## 2023-07-26 DIAGNOSIS — I469 Cardiac arrest, cause unspecified: Secondary | ICD-10-CM | POA: Diagnosis not present

## 2023-07-26 DIAGNOSIS — I10 Essential (primary) hypertension: Secondary | ICD-10-CM | POA: Diagnosis not present

## 2023-07-26 DIAGNOSIS — I82A13 Acute embolism and thrombosis of axillary vein, bilateral: Secondary | ICD-10-CM | POA: Diagnosis not present

## 2023-07-26 DIAGNOSIS — I739 Peripheral vascular disease, unspecified: Secondary | ICD-10-CM | POA: Diagnosis not present

## 2023-07-26 DIAGNOSIS — G8929 Other chronic pain: Secondary | ICD-10-CM | POA: Diagnosis not present

## 2023-07-27 DIAGNOSIS — I82A13 Acute embolism and thrombosis of axillary vein, bilateral: Secondary | ICD-10-CM | POA: Diagnosis not present

## 2023-07-27 DIAGNOSIS — G8929 Other chronic pain: Secondary | ICD-10-CM | POA: Diagnosis not present

## 2023-07-27 DIAGNOSIS — I10 Essential (primary) hypertension: Secondary | ICD-10-CM | POA: Diagnosis not present

## 2023-07-27 DIAGNOSIS — I469 Cardiac arrest, cause unspecified: Secondary | ICD-10-CM | POA: Diagnosis not present

## 2023-07-27 DIAGNOSIS — M549 Dorsalgia, unspecified: Secondary | ICD-10-CM | POA: Diagnosis not present

## 2023-07-27 DIAGNOSIS — I739 Peripheral vascular disease, unspecified: Secondary | ICD-10-CM | POA: Diagnosis not present

## 2023-07-27 NOTE — Addendum Note (Signed)
 Addended by: Kallan Merrick on: 07/27/2023 05:24 PM   Modules accepted: Orders

## 2023-07-27 NOTE — Telephone Encounter (Signed)
 If  you are actively bleeding   you should stop the blood thinner and be seen urgently in ED . All of the anticoagulants can  cause bleeding . Need updated blood count but active bleeding  requires stopping the eliquis  temporarily .   If the stools are from iron and not blood ? I am concerned that  you are not able to take in food.E[facility] Grizzle team and the surgeon need to be involved .

## 2023-07-27 NOTE — Telephone Encounter (Signed)
 So I doubt if eliquis  would cause difficulty swallowing  or eating as it is an anticoagulant   and  other options for anticoagulant would not be different . I too am concerned about  not able to keep food down and want the Gi and surgery team to assess asap .  Would it be possible for you to get a cbcdiff abd cmp, urinalysis , lab  when  you are out for appt anyway? . We can place orders here  at [facility] or even [facility] if amenable .  Forwarding message also to dr Melton Squires office

## 2023-07-28 ENCOUNTER — Other Ambulatory Visit: Payer: Self-pay | Admitting: Internal Medicine

## 2023-07-28 ENCOUNTER — Telehealth: Payer: Self-pay

## 2023-07-28 DIAGNOSIS — D649 Anemia, unspecified: Secondary | ICD-10-CM | POA: Diagnosis not present

## 2023-07-28 DIAGNOSIS — Z79899 Other long term (current) drug therapy: Secondary | ICD-10-CM

## 2023-07-28 DIAGNOSIS — R1310 Dysphagia, unspecified: Secondary | ICD-10-CM | POA: Diagnosis not present

## 2023-07-28 NOTE — Progress Notes (Signed)
   07/28/2023  Patient ID: Jade Cardenas, female   DOB: December 03, 1950, 73 y.o.   MRN: [id number]  Received message from PCP requesting outreach to patient to review her medication concerns, including issues swallowing carafate .  Placing referral so scheduling team can reach out.  Carnell Christian, PharmD Clinical Pharmacist [phone]

## 2023-07-29 ENCOUNTER — Ambulatory Visit: Payer: Self-pay | Admitting: Internal Medicine

## 2023-07-29 DIAGNOSIS — M797 Fibromyalgia: Secondary | ICD-10-CM

## 2023-07-29 DIAGNOSIS — L4050 Arthropathic psoriasis, unspecified: Secondary | ICD-10-CM

## 2023-07-29 LAB — URINALYSIS
Bilirubin, UA: NEGATIVE
Glucose, UA: NEGATIVE
Leukocytes,UA: NEGATIVE
Nitrite, UA: NEGATIVE
RBC, UA: NEGATIVE
Specific Gravity, UA: >=1.03 — AB (ref 1.005–1.030)
Urobilinogen, Ur: 0.2 mg/dL (ref 0.2–1.0)
pH, UA: 5 (ref 5.0–7.5)

## 2023-07-29 LAB — COMPREHENSIVE METABOLIC PANEL WITH GFR
ALT: 11 IU/L (ref 0–32)
AST: 11 IU/L (ref 0–40)
Albumin: 4.2 g/dL (ref 3.8–4.8)
Alkaline Phosphatase: 97 IU/L (ref 44–121)
BUN/Creatinine Ratio: 22 (ref 12–28)
BUN: 15 mg/dL (ref 8–27)
Bilirubin Total: 0.5 mg/dL (ref 0.0–1.2)
CO2: 21 mmol/L (ref 20–29)
Calcium: 9.7 mg/dL (ref 8.7–10.3)
Chloride: 100 mmol/L (ref 96–106)
Creatinine, Ser: 0.68 mg/dL (ref 0.57–1.00)
Globulin, Total: 2.7 g/dL (ref 1.5–4.5)
Glucose: 121 mg/dL — ABNORMAL HIGH (ref 70–99)
Potassium: 4.3 mmol/L (ref 3.5–5.2)
Sodium: 138 mmol/L (ref 134–144)
Total Protein: 6.9 g/dL (ref 6.0–8.5)
eGFR: 93 mL/min/{1.73_m2} (ref >59)

## 2023-07-29 LAB — CBC WITH DIFFERENTIAL/PLATELET
Basophils Absolute: 0 10*3/uL (ref 0.0–0.2)
Basos: 1 %
EOS (ABSOLUTE): 0.1 10*3/uL (ref 0.0–0.4)
Eos: 1 %
Hematocrit: 32.6 % — ABNORMAL LOW (ref 34.0–46.6)
Hemoglobin: 10.4 g/dL — ABNORMAL LOW (ref 11.1–15.9)
Immature Grans (Abs): 0 10*3/uL (ref 0.0–0.1)
Immature Granulocytes: 0 %
Lymphocytes Absolute: 1.7 10*3/uL (ref 0.7–3.1)
Lymphs: 20 %
MCH: 30.8 pg (ref 26.6–33.0)
MCHC: 31.9 g/dL (ref 31.5–35.7)
MCV: 96 fL (ref 79–97)
Monocytes Absolute: 0.6 10*3/uL (ref 0.1–0.9)
Monocytes: 7 %
Neutrophils Absolute: 6 10*3/uL (ref 1.4–7.0)
Neutrophils: 71 %
Platelets: 450 10*3/uL (ref 150–450)
RBC: 3.38 x10E6/uL — ABNORMAL LOW (ref 3.77–5.28)
RDW: 15.6 % — ABNORMAL HIGH (ref 11.7–15.4)
WBC: 8.4 10*3/uL (ref 3.4–10.8)

## 2023-07-29 NOTE — Telephone Encounter (Signed)
 Yes  [facility] asking you to change appt for cbcdiff  for 1 week  ( next wed th or fri) Please send also info to dr Salomon Cree . I sent info to Dr  Salomon Cree for his advice .

## 2023-07-29 NOTE — Progress Notes (Signed)
 Urine is not infected but shows concentration   ( need hydration and calories )Hg down to 10.4 1 point down  uncertain if  adjustment or  slow leaking .  Needs fu  I advise increase iron I will send info to dr Salomon Cree  to see if should be arranged soon.  How was the visit to the surgeon?  Kidney function and chemistry show no other  concerns  Would have  home health change  getting cbcdiff  plts to   next  week  to follow .  ( K can you contat them to change  blood count  to next week )

## 2023-07-30 ENCOUNTER — Telehealth: Payer: Self-pay | Admitting: *Deleted

## 2023-07-30 NOTE — Progress Notes (Signed)
 Care Guide Pharmacy Note  07/30/2023 Name: Jade Cardenas MRN: [id number] DOB: Nov 28, 1950  Referred By: Reginal Capra, MD Reason for referral: Complex Care Management (Outreach to schedule referral with pharmacist )   Jade Cardenas is a 73 y.o. year old female who is a primary care patient of Panosh, Wanda K, MD.  Jade Cardenas was referred to the pharmacist for assistance related to: carafate   Successful contact was made with the patient to discuss pharmacy services including being ready for the pharmacist to call at least 5 minutes before the scheduled appointment time and to have medication bottles and any blood pressure readings ready for review. The patient agreed to meet with the pharmacist via telephone visit on 08/02/2023  Kandis Ormond, CMA [place]  [facility], [facility] Guide Direct Dial: [phone]  Fax: [phone] Website: [email or web address]

## 2023-07-30 NOTE — Telephone Encounter (Signed)
 Spoke to [place] with home health yesterday. They have pt scheduled for blood work next Thursday.   Forwarding to providers for FYI.

## 2023-08-02 ENCOUNTER — Other Ambulatory Visit (INDEPENDENT_AMBULATORY_CARE_PROVIDER_SITE_OTHER)

## 2023-08-02 DIAGNOSIS — G8929 Other chronic pain: Secondary | ICD-10-CM | POA: Diagnosis not present

## 2023-08-02 DIAGNOSIS — I469 Cardiac arrest, cause unspecified: Secondary | ICD-10-CM | POA: Diagnosis not present

## 2023-08-02 DIAGNOSIS — I10 Essential (primary) hypertension: Secondary | ICD-10-CM | POA: Diagnosis not present

## 2023-08-02 DIAGNOSIS — M549 Dorsalgia, unspecified: Secondary | ICD-10-CM | POA: Diagnosis not present

## 2023-08-02 DIAGNOSIS — Z79899 Other long term (current) drug therapy: Secondary | ICD-10-CM

## 2023-08-02 DIAGNOSIS — I739 Peripheral vascular disease, unspecified: Secondary | ICD-10-CM | POA: Diagnosis not present

## 2023-08-02 DIAGNOSIS — I82A13 Acute embolism and thrombosis of axillary vein, bilateral: Secondary | ICD-10-CM | POA: Diagnosis not present

## 2023-08-02 NOTE — Progress Notes (Signed)
 08/02/2023 Name: Jade Cardenas MRN: [id number] DOB: 1951-02-24  Chief Complaint  Patient presents with   Medication Management    Jade Cardenas is a 73 y.o. year old female who presented for a telephone visit.   They were referred to the pharmacist by their PCP for assistance in managing complex medication management.    Subjective:  Care Team: Primary Care Provider: Panosh, Wanda K, MD ; Next Scheduled Visit: 09/28/23  Medication Access/Adherence  Current Pharmacy:  [id number] - [place], [place] - [address] AT CORNER OF [facility] [address] [place] [address] Phone: [phone] Fax: [phone]  [facility] [employer] [place], [place] - [address] [place] [address] Phone: [phone] Fax: [phone]  [facility] [address] [place] [address] Phone: [phone] Fax: [phone]   Patient reports affordability concerns with their medications: Yes  - Eliquis  Patient reports access/transportation concerns to their pharmacy: No  Patient reports adherence concerns with their medications:  No     Medication Management: -Cost concern with eliquis  as she has a $500+ deductible to meet on any name brand medication. Patient does note that it is anticipated she only need to be on the medication for total of 3 months, already has received the first month from pharmacy with help of free 30 day card -Issues swallowing carafate  and bethanechol   -Reporting no BP concerns since halving losartan , reporting controlled readings   Objective:  Lab Results  Component Value Date   HGBA1C 5.8 05/03/2023    Lab Results  Component Value Date   CREATININE 0.68 07/28/2023   BUN 15 07/28/2023   NA 138 07/28/2023   K 4.3 07/28/2023   CL 100 07/28/2023   CO2 21 07/28/2023    Lab Results  Component Value Date   CHOL 181 05/03/2023   HDL 70.60  05/03/2023   LDLCALC 82 05/03/2023   LDLDIRECT 114.0 04/02/2010   TRIG 140 06/30/2023   CHOLHDL 3 05/03/2023    Medications Reviewed Today     Reviewed by Carnell Christian, RPH (Pharmacist) on 08/02/23 at 1206  Med List Status: <None>   Medication Order Taking? Sig Documenting Provider Last Dose Status Informant  acetaminophen  (TYLENOL ) 325 MG tablet [id number] No Take 2 tablets (650 mg total) by mouth every 6 (six) hours. Zelda Hickman, PA-C Taking Active   apixaban  (ELIQUIS ) 5 MG TABS tablet [id number] No Take two tablets by mouth on 05/15 pm and 05/16 am. Then decrease to one tablet twice a day. Zelda Hickman, PA-C Taking Active   B-D ALLERGY  SYRINGE 1CC/28G 28G X 1/2" 1 ML MISC [id number] No USE AS DIRECTED ONCE WEEKLY Romayne Clubs, PA-C Taking Active Self  bethanechol  (URECHOLINE ) 10 MG tablet [id number] No Take 1 tablet (10 mg total) by mouth 3 (three) times daily. Zelda Hickman, PA-C Taking Active   Calcium  Carb-Cholecalciferol  (CALCIUM +D3 PO) [id number] No Take 1 tablet by mouth daily. [provider] Taking Active Self  carboxymethylcellulose (REFRESH PLUS) 0.5 % SOLN [id number] No Place 1 drop into both eyes as needed (dry eyes). [provider] Taking Active Self  diphenhydramine -acetaminophen  (TYLENOL  PM) 25-500 MG TABS tablet [id number] No Take 1 tablet by mouth at bedtime. [provider] Taking Active Self  famotidine  (PEPCID ) 40 MG tablet [id number] No Take 1 tablet (40 mg total) by mouth daily. Zelda Hickman, PA-C Taking Active   folic acid  (FOLVITE ) 1 MG tablet  [id number] No Take 1 tablet (1 mg total) by mouth daily. Romayne Clubs, PA-C Taking Active Self  lidocaine  (LIDODERM ) 5 % [id number] No Place 2 patches onto the skin daily. Remove & Discard patch within 12 hours or as directed by MD  Patient not taking: Reported on 07/21/2023   Zelda Hickman, PA-C Not Taking Active   loperamide  (IMODIUM ) 2 MG capsule [id number] No Take 1 capsule (2 mg total) by  mouth as needed for diarrhea or loose stools. Zelda Hickman, PA-C Taking Active   losartan  (COZAAR ) 25 MG tablet [id number] No Take 0.5 tablets (12.5 mg total) by mouth daily. Zelda Hickman, PA-C Taking Active   methotrexate  50 MG/2ML injection [id number] No INJECT 0.3ML INTO THE SKIN ONCE WEEKLY  Patient taking differently: Inject 7.5 mg into the skin once a week. INJECT 0.3ML INTO THE SKIN ONCE WEEKLY   Romayne Clubs, PA-C Taking Active Self  Multiple Vitamins-Minerals (MULTIVITAMIN ADULTS PO) [id number] No Take 1 tablet by mouth daily.  [provider] Taking Active Self  oxyCODONE  (OXY IR/ROXICODONE ) 5 MG immediate release tablet [id number] No Take 1 tablet (5 mg total) by mouth every 6 (six) hours as needed for severe pain (pain score 7-10). Shela Derby, MD Taking Active   pantoprazole  (PROTONIX ) 40 MG tablet [id number] No Take 40 mg by mouth 2 (two) times daily. [provider] Taking Active Self  SIMPONI  50 MG/0.5ML SOAJ [id number] No INJECT 50MG  UNDER THE SKIN ONCE MONTHLY  Patient taking differently: Inject 50 mg into the skin every 30 (thirty) days. INJECT 50MG  UNDER THE SKIN ONCE MONTHLY   Deveshwar, Clydie Darter, MD Taking Active Self  sucralfate  (CARAFATE ) 1 g tablet [id number] No Take 1 tablet (1 g total) by mouth 3 (three) times daily before meals. Zelda Hickman, PA-C Taking Active   traZODone  (DESYREL ) 50 MG tablet [id number] No Take 1 tablet (50 mg total) by mouth at bedtime. Zelda Hickman, PA-C Taking Active               Assessment/Plan:   Medication Management: -Providing 5 weeks of eliquis  samples in office today. Follow up scheduled to determine needed plan of action once PCP back in office -Counseled on how to make a carfate slurry so that patient can take the medication -Counseled on tips for swallowing pills with bethanechol , patient will reach out to surgeon team if she continues having issues with that medication or issues urinating following her  procedure -Continue BP monitoring and halving losartan     Follow Up Plan: 3 weeks  Carnell Christian, PharmD Clinical Pharmacist [phone]

## 2023-08-04 ENCOUNTER — Other Ambulatory Visit: Payer: Self-pay | Admitting: Internal Medicine

## 2023-08-04 DIAGNOSIS — I10 Essential (primary) hypertension: Secondary | ICD-10-CM | POA: Diagnosis not present

## 2023-08-04 DIAGNOSIS — I739 Peripheral vascular disease, unspecified: Secondary | ICD-10-CM | POA: Diagnosis not present

## 2023-08-04 DIAGNOSIS — I469 Cardiac arrest, cause unspecified: Secondary | ICD-10-CM | POA: Diagnosis not present

## 2023-08-04 DIAGNOSIS — M549 Dorsalgia, unspecified: Secondary | ICD-10-CM | POA: Diagnosis not present

## 2023-08-04 DIAGNOSIS — I82A13 Acute embolism and thrombosis of axillary vein, bilateral: Secondary | ICD-10-CM | POA: Diagnosis not present

## 2023-08-04 DIAGNOSIS — G8929 Other chronic pain: Secondary | ICD-10-CM | POA: Diagnosis not present

## 2023-08-04 NOTE — Telephone Encounter (Unsigned)
 Copied from CRM [id number]. Topic: Clinical - Medication Refill >> Aug 04, 2023  5:03 PM Chuck Crater wrote: Medication: losartan  (COZAAR ) 25 MG tablet  Has the patient contacted their pharmacy? No (Agent: If no, request that the patient contact the pharmacy for the refill. If patient does not wish to contact the pharmacy document the reason why and proceed with request.) (Agent: If yes, when and what did the pharmacy advise?) received from hospital  This is the patient's preferred pharmacy:  [id number] [place], [place] - [address] AT CORNER OF [facility] [address] [place] [address] Phone: [phone] Fax: [phone]   Is this the correct pharmacy for this prescription? Yes If no, delete pharmacy and type the correct one.   Has the prescription been filled recently? No  Is the patient out of the medication? No 1 left  Has the patient been seen for an appointment in the last year OR does the patient have an upcoming appointment? Yes  Can we respond through MyChart? Yes  Agent: Please be advised that Rx refills may take up to 3 business days. We ask that you follow-up with your pharmacy.

## 2023-08-05 ENCOUNTER — Telehealth: Payer: Self-pay | Admitting: *Deleted

## 2023-08-05 DIAGNOSIS — D649 Anemia, unspecified: Secondary | ICD-10-CM | POA: Diagnosis not present

## 2023-08-05 NOTE — Telephone Encounter (Signed)
 Copied from CRM [id number]. Topic: Clinical - Medication Refill >> Aug 04, 2023  5:03 PM Chuck Crater wrote: Medication: losartan  (COZAAR ) 25 MG tablet  Has the patient contacted their pharmacy? No (Agent: If no, request that the patient contact the pharmacy for the refill. If patient does not wish to contact the pharmacy document the reason why and proceed with request.) (Agent: If yes, when and what did the pharmacy advise?) received from hospital  This is the patient's preferred pharmacy:  [id number] - [place], [place] - [address] AT CORNER OF [facility] [address] [place] [address] Phone: [phone] Fax: [phone]   Is this the correct pharmacy for this prescription? Yes If no, delete pharmacy and type the correct one.   Has the prescription been filled recently? No  Is the patient out of the medication? No 1 left  Has the patient been seen for an appointment in the last year OR does the patient have an upcoming appointment? Yes  Can we respond through MyChart? Yes  Agent: Please be advised that Rx refills may take up to 3 business days. We ask that you follow-up with your pharmacy. >> Aug 05, 2023  2:22 PM Abigail D wrote: Patient called for status update, wondering if it's possible to have it ready for the afternoon. She has her last pill today so she will need to have it ready for tomorrow if able.

## 2023-08-05 NOTE — Telephone Encounter (Signed)
 Copied from CRM [id number]. Topic: Clinical - Home Health Verbal Orders >> Aug 05, 2023 10:35 AM Laird Pih wrote: Caller/Agency: Leanne/[facility]  Callback Number: [phone] Service Requested: Skilled Nursing Frequency: 1 week for 3 weeks, every other week 5 weeks - anemia/hypertension management. Any new concerns about the patient? No, encouraging her to drink. Was unable to draw blood 3x on patient for labs.

## 2023-08-05 NOTE — Telephone Encounter (Signed)
 Copied from CRM [id number]. Topic: Clinical - Home Health Verbal Orders >> Aug 05, 2023  1:20 PM Leah C wrote: Caller/Agency: [facility]  Callback Number: [phone] Service Requested: Nursing-  Frequency: 2x/week for four weeks  Any new concerns about the patient? Nurse went out to patients home yesterday and was unable to obtain labs. Bernabe Brew called in to speak with someone directly to get the sign off for the the nurse to redraw labs today. Bernabe Brew stated that the nurse is in route to the patients house and can't draw labs without the call back from the clinic.

## 2023-08-06 ENCOUNTER — Telehealth: Payer: Self-pay

## 2023-08-06 ENCOUNTER — Encounter: Payer: Self-pay | Admitting: Internal Medicine

## 2023-08-06 MED ORDER — LOSARTAN POTASSIUM 25 MG PO TABS: 12.5000 mg | ORAL_TABLET | Freq: Every day | ORAL | 3 refills | Status: DC

## 2023-08-06 NOTE — Telephone Encounter (Signed)
 Copied from CRM [id number]. Topic: Clinical - Prescription Issue >> Aug 06, 2023 11:00 AM Alyse July wrote: Reason for CRM: Patient is currently out of medication and would like it before the weekend. losartan  (COZAAR ) 25 MG tablet. [phone].

## 2023-08-10 ENCOUNTER — Encounter: Payer: Self-pay | Admitting: Neurology

## 2023-08-10 ENCOUNTER — Telehealth: Payer: Self-pay

## 2023-08-10 ENCOUNTER — Ambulatory Visit (INDEPENDENT_AMBULATORY_CARE_PROVIDER_SITE_OTHER): Payer: Medicare Other | Admitting: Neurology

## 2023-08-10 VITALS — BP 151/72 | HR 100 | Ht 65.0 in | Wt 194.8 lb

## 2023-08-10 DIAGNOSIS — G629 Polyneuropathy, unspecified: Secondary | ICD-10-CM

## 2023-08-10 MED ORDER — LIDOCAINE 5 % EX OINT: 1.0000 | TOPICAL_OINTMENT | Freq: Two times a day (BID) | CUTANEOUS | 3 refills | Status: DC | PRN

## 2023-08-10 NOTE — Telephone Encounter (Signed)
 Copied from CRM [id number]. Topic: General - Other >> Aug 10, 2023 10:16 AM Sasha H wrote: Reason for CRM: Leanne from [facility] called in stating they are still waiting to hear back about verbal orders for this pt. Please reach out at [phone]

## 2023-08-10 NOTE — Patient Instructions (Signed)
 Start lidocaine  ointment to feet

## 2023-08-10 NOTE — Progress Notes (Signed)
 Follow-up Visit   Date: 08/10/2023    Jade Cardenas MRN: [id number] DOB: 26-Aug-1950    Jade Cardenas is a 73 y.o. left-handed female with psoriatic arthritis, hypertension, hyperlipidemia, s/p lumbar surgery  x 2 and cervical surgery x 2 returning to the clinic for follow-up of small fiber neuropathy.  The patient was accompanied to the clinic by friend.   IMPRESSION/PLAN: Small fiber neuropathy confirmed by skin biopsy contributed by immune-mediated arthritis and age.  Previously tried:  Cymbalta , Lyrica  - Nortriptyline declined  - Start 5% lidocaine  ointment to feet twice daily  History of cervical surgery at C3-4 and C5-6  (Dr. Adonis Alamin), currently asymptomatic.  If she develops bilateral hand weakness or paresthesias, PT can be started.   - Continue to monitor  Return to clinic in 9 months  --------------------------------------------- History of present illness: Starting in late 2021, she began having numbness/tingling involving the soles and the lateral side of her right foot.  She also has similar symptoms over the soles of the left foot.  Symptoms are constant. She has some weakness in the right foot, especially with her toes.  Occasionally, she has shooting pain down the back of her right leg.   She has history two cervical and two lumbar surgeries by Dr. Gwendlyn Lemmings.  She has no history of diabetes, alcohol use, or chemotherapy.   UPDATE 07/28/2022:  Over the past several months, she has been having neck stiffness and pain radiating in to the left shoulder.  She continues to have burning pain in the feet and feels that Cymbalta  does not help at all.  No interval falls or illnesses.   UPDATE 11/10/2022:  She is here for follow-up.  Her neuropathy remains unchanged.  She uses Salonpas ointment to the feet which provides her with adequate relief.   Her hand tingling is less, she continues to have weakness.  She was unable to to PT because of concerns it could worsen her  hiatal hernia. She has many questions regarding whether or not to proceed with hiatal hernia.  She has a lot of stress trying to make this decision which has intensified headaches.  She has tension headache a few times per month.  She takes excedrin which helps.    UPDATE 08/10/2023:  She is here for follow-up visit.  She was hospitalized in April 2025 for elective laparoscopic fundoplication for hiatal hernia which was complicated by intraoperative bleeding, shock, cardiac arrest, and required CPR, transfusions, and gastric tube placement.  She is on eliquis  for DVT.  Since this event, her bilateral feet pain has intensified. She is now at home and able to do nearly all ADLs, and has assistance with bathing.  She is walking with a rollator.   Medications:  Current Outpatient Medications on File Prior to Visit  Medication Sig Dispense Refill   acetaminophen  (TYLENOL ) 325 MG tablet Take 2 tablets (650 mg total) by mouth every 6 (six) hours. (Patient taking differently: Take 650 mg by mouth every 4 (four) hours as needed.)     apixaban  (ELIQUIS ) 5 MG TABS tablet Take two tablets by mouth on 05/15 pm and 05/16 am. Then decrease to one tablet twice a day. 64 tablet 0   B-D ALLERGY  SYRINGE 1CC/28G 28G X 1/2" 1 ML MISC USE AS DIRECTED ONCE WEEKLY 12 each 3   bethanechol  (URECHOLINE ) 10 MG tablet Take 1 tablet (10 mg total) by mouth 3 (three) times daily. 90 tablet 0   Calcium  Carb-Cholecalciferol  (CALCIUM +D3 PO) Take  1 tablet by mouth daily.     carboxymethylcellulose (REFRESH PLUS) 0.5 % SOLN Place 1 drop into both eyes as needed (dry eyes).     diphenhydramine -acetaminophen  (TYLENOL  PM) 25-500 MG TABS tablet Take 1 tablet by mouth at bedtime.     famotidine  (PEPCID ) 40 MG tablet Take 1 tablet (40 mg total) by mouth daily. 30 tablet 0   folic acid  (FOLVITE ) 1 MG tablet Take 1 tablet (1 mg total) by mouth daily. 90 tablet 3   loperamide  (IMODIUM ) 2 MG capsule Take 1 capsule (2 mg total) by mouth as needed  for diarrhea or loose stools. 30 capsule 0   losartan  (COZAAR ) 25 MG tablet Take 0.5 tablets (12.5 mg total) by mouth daily. 30 tablet 3   methotrexate  50 MG/2ML injection INJECT 0.3ML INTO THE SKIN ONCE WEEKLY (Patient taking differently: Inject 7.5 mg into the skin once a week. INJECT 0.3ML INTO THE SKIN ONCE WEEKLY) 6 mL 0   Multiple Vitamins-Minerals (MULTIVITAMIN ADULTS PO) Take 1 tablet by mouth daily.      pantoprazole  (PROTONIX ) 40 MG tablet Take 40 mg by mouth 2 (two) times daily.     SIMPONI  50 MG/0.5ML SOAJ INJECT 50MG  UNDER THE SKIN ONCE MONTHLY (Patient taking differently: Inject 50 mg into the skin every 30 (thirty) days. INJECT 50MG  UNDER THE SKIN ONCE MONTHLY) 0.5 mL 4   No current facility-administered medications on file prior to visit.    Allergies:  Allergies  Allergen Reactions   Erythromycin Hives, Swelling and Other (See Comments)    All Mycins; throat swells shut and she gets "really hot"   Iodinated Contrast Media Shortness Of Breath and Swelling    CT contrast; throat swelling, difficulty breathing.  In [place]  "many years ago."   Ace Inhibitors Cough   Indomethacin Other (See Comments)   Keflex [Cephalexin] Hives   Macrobid  [Nitrofurantoin ] Other (See Comments)    dizziness    Vital Signs:  BP (!) 151/72   Pulse 100   Ht 5\' 5"  (1.651 m)   Wt 194 lb 12.8 oz (88.4 kg)   LMP 03/02/1984   SpO2 99%   BMI 32.42 kg/m   Neurological Exam: MENTAL STATUS including orientation to time, place, person, recent and remote memory, attention span and concentration, language, and fund of knowledge is normal.  Speech is not dysarthric.  CRANIAL NERVES:   Pupils equal round and reactive to light.  Normal conjugate, extra-ocular eye movements in all directions of gaze.  Mild right ptosis.  Face is symmetric.   MOTOR:  Motor strength is 5/5 in all extremities.  No atrophy, fasciculations or abnormal movements.  No pronator drift.  Tone is normal.    MSRs:  Reflexes are  3+/4 throughout.  SENSORY:  reduced vibration at the great toe on the right, otherwise intact throughout  COORDINATION/GAIT:  Gait is assisted with rollator, stable.    Data: MRI lumbar spine 02/26/2021: 1. Prior L4-L5 PLIF with new mild adjacent segment disease at L3-L4. No high-grade spinal canal or neuroforaminal stenosis at any level 2. Progressive severe bilateral facet arthropathy at L5-S1.   NCS/EMG of bilateral legs 04/03/2021:  Normal  NCS/EMG of bilateral arms 12/25/2021:   Chronic C5-C6 radiculopathy affecting bilateral upper extremities, mild. There is no evidence of carpal tunnel syndrome or ulnar neuropathy affecting either upper extremity.   Thank you for allowing me to participate in patient's care.  If I can answer any additional questions, I would be pleased to do so.  Sincerely,    Anajah Sterbenz K. Lydia Sams, DO

## 2023-08-10 NOTE — Telephone Encounter (Signed)
 Verbal orders given to [facility]anne[facility] for skilled nursing.

## 2023-08-10 NOTE — Telephone Encounter (Signed)
 Rx was sent on 08/06/2023.

## 2023-08-11 NOTE — Telephone Encounter (Signed)
 Verbal orders given to Leanne from [facility]

## 2023-08-11 NOTE — Telephone Encounter (Signed)
 Left message on machine for Jade Cardenas to return our call.

## 2023-08-11 NOTE — Telephone Encounter (Signed)
 See phone note.  Verbal orders given to Leanne from [facility]

## 2023-08-12 ENCOUNTER — Ambulatory Visit (HOSPITAL_BASED_OUTPATIENT_CLINIC_OR_DEPARTMENT_OTHER): Payer: Medicare Other | Admitting: Obstetrics & Gynecology

## 2023-08-12 ENCOUNTER — Encounter (HOSPITAL_BASED_OUTPATIENT_CLINIC_OR_DEPARTMENT_OTHER): Payer: Self-pay | Admitting: Obstetrics & Gynecology

## 2023-08-12 ENCOUNTER — Other Ambulatory Visit (HOSPITAL_COMMUNITY)
Admission: RE | Admit: 2023-08-12 | Discharge: 2023-08-12 | Disposition: A | Discharge status: Home / Self Care | Source: Ambulatory Visit | Attending: Obstetrics & Gynecology | Admitting: Obstetrics & Gynecology

## 2023-08-12 VITALS — BP 122/78 | HR 92 | Ht 65.0 in | Wt 193.0 lb

## 2023-08-12 DIAGNOSIS — M85852 Other specified disorders of bone density and structure, left thigh: Secondary | ICD-10-CM | POA: Diagnosis not present

## 2023-08-12 DIAGNOSIS — Z9189 Other specified personal risk factors, not elsewhere classified: Secondary | ICD-10-CM | POA: Diagnosis not present

## 2023-08-12 DIAGNOSIS — M85851 Other specified disorders of bone density and structure, right thigh: Secondary | ICD-10-CM

## 2023-08-12 DIAGNOSIS — Z86718 Personal history of other venous thrombosis and embolism: Secondary | ICD-10-CM | POA: Diagnosis not present

## 2023-08-12 DIAGNOSIS — R197 Diarrhea, unspecified: Secondary | ICD-10-CM | POA: Diagnosis not present

## 2023-08-12 DIAGNOSIS — Z01419 Encounter for gynecological examination (general) (routine) without abnormal findings: Secondary | ICD-10-CM

## 2023-08-12 NOTE — Progress Notes (Signed)
 Breast and Pelvic Exam Patient name: Jade Cardenas MRN [id number]  Date of birth: 1950/11/30 Chief Complaint:   Breast and Pelvic Exam  History of Present Illness:   Jade Cardenas is a 73 y.o. G0P0000 Caucasian female being seen today for breast and pelvic exam.  Pt underwent surgery on 4/29 for attempted hiatal hernia repair and fundoplication.  There was a splenic artery injury and pt had significant blood loss.  Was resuscitated in the OR.  Laparotomy was needed.  Bleeding was ultimately controlled and procedure was ended.  She did her initial recovery in the ICU.    She is going really well considering all she's been through.  Using a cane for assistance with walking.  Getting stronger every day.  Has a Gastric tube that will be removed this week.  She has an area she is concerned about and wants me to look at this today.  Her mental sharpness is also quite good.    Patient's last menstrual period was 03/02/1984.   Last pap 08/06/2022. Results were: negative.  H/O DES exposure.   Last mammogram: 11/28/2022 . Results were: normal.  Last colonoscopy: 11/12/2020. Results were: normal.  F/u 5 years recommended.     08/12/2023   10:32 AM 05/12/2023    9:32 AM 11/12/2022   10:09 AM 08/06/2022   11:07 AM 05/08/2022    3:47 PM  Depression screen PHQ 2/9  Decreased Interest 0 1 0 0 0  Down, Depressed, Hopeless 0 1 0 0 0  PHQ - 2 Score 0 2 0 0 0  Altered sleeping  2 3    Tired, decreased energy  3 3    Change in appetite  0 1    Feeling bad or failure about yourself   0 0    Trouble concentrating  0 0    Moving slowly or fidgety/restless  0 0    Suicidal thoughts  0 0    PHQ-9 Score  7 7    Difficult doing work/chores  Not difficult at all Somewhat difficult      Review of Systems:   Pertinent items are noted in HPI  Denies any urinary concerns or pelvic pain Pertinent History Reviewed:  Reviewed past medical,surgical, social and family history.  Reviewed problem list,  medications and allergies. Physical Assessment:   Vitals:   08/12/23 1027  BP: 122/78  Pulse: 92  Weight: 193 lb (87.5 kg)  Height: 5' 5 (1.651 m)  Body mass index is 32.12 kg/m.        Physical Examination:   General appearance - well appearing, and in no distress  Mental status - alert, oriented to person, place, and time  Psych:  She has a normal mood and affect  Skin - warm and dry, normal color, no suspicious lesions noted  Chest - effort normal, all lung fields clear to auscultation bilaterally  Heart - normal rate and regular rhythm  Neck:  midline trachea, no thyromegaly or nodules  Breasts - breasts appear normal, no suspicious masses, no skin or nipple changes or  axillary nodes  Abdomen - soft, nontender, nondistended, no masses or organomegaly  Pelvic - VULVA: normal appearing vulva with no masses, tenderness or lesions   VAGINA: atrophic changes CERVIX: surgically absent  Thin prep pap is obtained  UTERUS: uterus is felt to be normal size, shape, consistency and nontender   ADNEXA: No adnexal masses or tenderness noted.  Rectal - normal rectal, good sphincter tone, no  masses felt.   Extremities:  No swelling or varicosities noted  Chaperone present for exam Assessment & Plan:  1. Encntr for gyn exam (general) (routine) w/o abn findings (Primary) - Pap obtained today - Mammogram 08/2022 - Colonoscopy 2022 - Bone mineral density 2022 with mild osteopenia.  Plan to repeat next year. - lab work done with PCP, Dr. Ethel Henry - vaccines reviewed/updated  2. DES exposure in utero - Cytology - PAP( [place])  3. Osteopenia of necks of both femurs - taking calcium  plus Vit d.  Reviewed dosages.   - repeat BMD next year  4. History of DVT (deep vein thrombosis)    Meds: No orders of the defined types were placed in this encounter.   Follow-up: Return in about 1 year (around 08/11/2024).  Lillian Rein, MD 08/15/2023 9:51 AM

## 2023-08-13 DIAGNOSIS — G8929 Other chronic pain: Secondary | ICD-10-CM | POA: Diagnosis not present

## 2023-08-13 DIAGNOSIS — I10 Essential (primary) hypertension: Secondary | ICD-10-CM | POA: Diagnosis not present

## 2023-08-13 DIAGNOSIS — I739 Peripheral vascular disease, unspecified: Secondary | ICD-10-CM | POA: Diagnosis not present

## 2023-08-13 DIAGNOSIS — I469 Cardiac arrest, cause unspecified: Secondary | ICD-10-CM | POA: Diagnosis not present

## 2023-08-13 DIAGNOSIS — M549 Dorsalgia, unspecified: Secondary | ICD-10-CM | POA: Diagnosis not present

## 2023-08-13 DIAGNOSIS — I82A13 Acute embolism and thrombosis of axillary vein, bilateral: Secondary | ICD-10-CM | POA: Diagnosis not present

## 2023-08-16 ENCOUNTER — Telehealth: Payer: Self-pay

## 2023-08-16 DIAGNOSIS — M549 Dorsalgia, unspecified: Secondary | ICD-10-CM | POA: Diagnosis not present

## 2023-08-16 DIAGNOSIS — I82A13 Acute embolism and thrombosis of axillary vein, bilateral: Secondary | ICD-10-CM | POA: Diagnosis not present

## 2023-08-16 DIAGNOSIS — I469 Cardiac arrest, cause unspecified: Secondary | ICD-10-CM | POA: Diagnosis not present

## 2023-08-16 DIAGNOSIS — I10 Essential (primary) hypertension: Secondary | ICD-10-CM | POA: Diagnosis not present

## 2023-08-16 DIAGNOSIS — G8929 Other chronic pain: Secondary | ICD-10-CM | POA: Diagnosis not present

## 2023-08-16 DIAGNOSIS — I739 Peripheral vascular disease, unspecified: Secondary | ICD-10-CM | POA: Diagnosis not present

## 2023-08-16 NOTE — Telephone Encounter (Signed)
 Copied from CRM [id number]. Topic: Clinical - Home Health Verbal Orders >> Aug 16, 2023  2:15 PM Mesmerise C wrote: Caller/Agency: [facility] Callback Number: [phone] Service Requested: Physical Therapy Frequency: Extend physical therapy for 1 visit for next week Any new concerns about the patient? No

## 2023-08-17 ENCOUNTER — Ambulatory Visit (HOSPITAL_BASED_OUTPATIENT_CLINIC_OR_DEPARTMENT_OTHER): Payer: Self-pay | Admitting: Obstetrics & Gynecology

## 2023-08-17 DIAGNOSIS — I739 Peripheral vascular disease, unspecified: Secondary | ICD-10-CM | POA: Diagnosis not present

## 2023-08-17 DIAGNOSIS — G4700 Insomnia, unspecified: Secondary | ICD-10-CM | POA: Diagnosis not present

## 2023-08-17 DIAGNOSIS — D62 Acute posthemorrhagic anemia: Secondary | ICD-10-CM | POA: Diagnosis not present

## 2023-08-17 DIAGNOSIS — M069 Rheumatoid arthritis, unspecified: Secondary | ICD-10-CM | POA: Diagnosis not present

## 2023-08-17 DIAGNOSIS — M8589 Other specified disorders of bone density and structure, multiple sites: Secondary | ICD-10-CM | POA: Diagnosis not present

## 2023-08-17 DIAGNOSIS — K589 Irritable bowel syndrome without diarrhea: Secondary | ICD-10-CM | POA: Diagnosis not present

## 2023-08-17 DIAGNOSIS — M170 Bilateral primary osteoarthritis of knee: Secondary | ICD-10-CM | POA: Diagnosis not present

## 2023-08-17 DIAGNOSIS — K219 Gastro-esophageal reflux disease without esophagitis: Secondary | ICD-10-CM | POA: Diagnosis not present

## 2023-08-17 DIAGNOSIS — G608 Other hereditary and idiopathic neuropathies: Secondary | ICD-10-CM | POA: Diagnosis not present

## 2023-08-17 DIAGNOSIS — D509 Iron deficiency anemia, unspecified: Secondary | ICD-10-CM | POA: Diagnosis not present

## 2023-08-17 DIAGNOSIS — L4050 Arthropathic psoriasis, unspecified: Secondary | ICD-10-CM | POA: Diagnosis not present

## 2023-08-17 DIAGNOSIS — I82A13 Acute embolism and thrombosis of axillary vein, bilateral: Secondary | ICD-10-CM | POA: Diagnosis not present

## 2023-08-17 DIAGNOSIS — M797 Fibromyalgia: Secondary | ICD-10-CM | POA: Diagnosis not present

## 2023-08-17 DIAGNOSIS — I83893 Varicose veins of bilateral lower extremities with other complications: Secondary | ICD-10-CM | POA: Diagnosis not present

## 2023-08-17 DIAGNOSIS — E611 Iron deficiency: Secondary | ICD-10-CM | POA: Diagnosis not present

## 2023-08-17 DIAGNOSIS — I4510 Unspecified right bundle-branch block: Secondary | ICD-10-CM | POA: Diagnosis not present

## 2023-08-17 DIAGNOSIS — M47812 Spondylosis without myelopathy or radiculopathy, cervical region: Secondary | ICD-10-CM | POA: Diagnosis not present

## 2023-08-17 DIAGNOSIS — M51369 Other intervertebral disc degeneration, lumbar region without mention of lumbar back pain or lower extremity pain: Secondary | ICD-10-CM | POA: Diagnosis not present

## 2023-08-17 DIAGNOSIS — M48062 Spinal stenosis, lumbar region with neurogenic claudication: Secondary | ICD-10-CM | POA: Diagnosis not present

## 2023-08-17 DIAGNOSIS — M549 Dorsalgia, unspecified: Secondary | ICD-10-CM | POA: Diagnosis not present

## 2023-08-17 DIAGNOSIS — M4802 Spinal stenosis, cervical region: Secondary | ICD-10-CM | POA: Diagnosis not present

## 2023-08-17 DIAGNOSIS — I10 Essential (primary) hypertension: Secondary | ICD-10-CM | POA: Diagnosis not present

## 2023-08-17 DIAGNOSIS — I469 Cardiac arrest, cause unspecified: Secondary | ICD-10-CM | POA: Diagnosis not present

## 2023-08-17 DIAGNOSIS — G8929 Other chronic pain: Secondary | ICD-10-CM | POA: Diagnosis not present

## 2023-08-17 DIAGNOSIS — K254 Chronic or unspecified gastric ulcer with hemorrhage: Secondary | ICD-10-CM | POA: Diagnosis not present

## 2023-08-17 LAB — CYTOLOGY - PAP
Adequacy: ABSENT
Diagnosis: NEGATIVE

## 2023-08-17 NOTE — Telephone Encounter (Signed)
 Left a detail message for Jade Cardenas that Verbal Order is approved. Also to call us  back if have any questions.

## 2023-08-18 DIAGNOSIS — I739 Peripheral vascular disease, unspecified: Secondary | ICD-10-CM | POA: Diagnosis not present

## 2023-08-18 DIAGNOSIS — M549 Dorsalgia, unspecified: Secondary | ICD-10-CM | POA: Diagnosis not present

## 2023-08-18 DIAGNOSIS — G8929 Other chronic pain: Secondary | ICD-10-CM | POA: Diagnosis not present

## 2023-08-18 DIAGNOSIS — I469 Cardiac arrest, cause unspecified: Secondary | ICD-10-CM | POA: Diagnosis not present

## 2023-08-18 DIAGNOSIS — I10 Essential (primary) hypertension: Secondary | ICD-10-CM | POA: Diagnosis not present

## 2023-08-18 DIAGNOSIS — I82A13 Acute embolism and thrombosis of axillary vein, bilateral: Secondary | ICD-10-CM | POA: Diagnosis not present

## 2023-08-19 DIAGNOSIS — G8929 Other chronic pain: Secondary | ICD-10-CM | POA: Diagnosis not present

## 2023-08-19 DIAGNOSIS — I83893 Varicose veins of bilateral lower extremities with other complications: Secondary | ICD-10-CM | POA: Diagnosis not present

## 2023-08-19 DIAGNOSIS — I10 Essential (primary) hypertension: Secondary | ICD-10-CM | POA: Diagnosis not present

## 2023-08-19 DIAGNOSIS — M48062 Spinal stenosis, lumbar region with neurogenic claudication: Secondary | ICD-10-CM | POA: Diagnosis not present

## 2023-08-19 DIAGNOSIS — I739 Peripheral vascular disease, unspecified: Secondary | ICD-10-CM | POA: Diagnosis not present

## 2023-08-19 DIAGNOSIS — M47812 Spondylosis without myelopathy or radiculopathy, cervical region: Secondary | ICD-10-CM | POA: Diagnosis not present

## 2023-08-19 DIAGNOSIS — I469 Cardiac arrest, cause unspecified: Secondary | ICD-10-CM | POA: Diagnosis not present

## 2023-08-19 DIAGNOSIS — I82A13 Acute embolism and thrombosis of axillary vein, bilateral: Secondary | ICD-10-CM | POA: Diagnosis not present

## 2023-08-19 DIAGNOSIS — M549 Dorsalgia, unspecified: Secondary | ICD-10-CM | POA: Diagnosis not present

## 2023-08-19 DIAGNOSIS — M51369 Other intervertebral disc degeneration, lumbar region without mention of lumbar back pain or lower extremity pain: Secondary | ICD-10-CM | POA: Diagnosis not present

## 2023-08-19 DIAGNOSIS — M4802 Spinal stenosis, cervical region: Secondary | ICD-10-CM | POA: Diagnosis not present

## 2023-08-19 DIAGNOSIS — M069 Rheumatoid arthritis, unspecified: Secondary | ICD-10-CM | POA: Diagnosis not present

## 2023-08-19 NOTE — Telephone Encounter (Signed)
 Glad things are improving   . Please place order for PT as above  when she finished with Home health. Post  surgery and hospital with  arthritis

## 2023-08-25 ENCOUNTER — Other Ambulatory Visit (INDEPENDENT_AMBULATORY_CARE_PROVIDER_SITE_OTHER)

## 2023-08-25 DIAGNOSIS — K559 Vascular disorder of intestine, unspecified: Secondary | ICD-10-CM | POA: Diagnosis not present

## 2023-08-25 DIAGNOSIS — K219 Gastro-esophageal reflux disease without esophagitis: Secondary | ICD-10-CM | POA: Diagnosis not present

## 2023-08-25 DIAGNOSIS — K449 Diaphragmatic hernia without obstruction or gangrene: Secondary | ICD-10-CM | POA: Diagnosis not present

## 2023-08-25 DIAGNOSIS — D500 Iron deficiency anemia secondary to blood loss (chronic): Secondary | ICD-10-CM | POA: Diagnosis not present

## 2023-08-25 DIAGNOSIS — Z79899 Other long term (current) drug therapy: Secondary | ICD-10-CM

## 2023-08-25 LAB — LAB REPORT - SCANNED

## 2023-08-25 NOTE — Progress Notes (Signed)
 08/25/2023 Name: Jade Cardenas MRN: [id number] DOB: 08/15/1950  Chief Complaint  Patient presents with   Medication Management    Jade Cardenas is a 73 y.o. year old female who presented for a telephone visit.   They were referred to the pharmacist by their PCP for assistance in managing complex medication management.    Subjective:  Care Team: Primary Care Provider: Panosh, Wanda K, MD ; Next Scheduled Visit: 09/28/23  Medication Access/Adherence  [facility]  [phone] [place], [place] - [address] AT [address]NER OF [facility] [address] [place] [address] Phone: [phone] Fax: [phone]  [facility] [place], [place] - [address] [place] [address] Phone: [phone] Fax: [phone]  [facility] [address] [place] [address] Phone: [phone] Fax: [phone]   Patient reports affordability concerns with their medications: Yes  - Eliquis  Patient reports access/transportation concerns to their pharmacy: No  Patient reports adherence concerns with their medications:  No     Medication Management: -Cost concern with eliquis  as she has a $500+ deductible to meet on any name brand medication. Patient does note that it is anticipated she only need to be on the medication for total of 3 months, already has received the first month from pharmacy with help of free 30 day card, still has samples on hand as well provided from last visit   Objective:  Lab Results  Component Value Date   HGBA1C 5.8 05/03/2023    Lab Results  Component Value Date   CREATININE 0.68 07/28/2023   BUN 15 07/28/2023   NA 138 07/28/2023   K 4.3 07/28/2023   CL 100 07/28/2023   CO2 21 07/28/2023    Lab Results  Component Value Date   CHOL 181 05/03/2023   HDL 70.60 05/03/2023   LDLCALC 82 05/03/2023   LDLDIRECT 114.0 04/02/2010   TRIG  140 06/30/2023   CHOLHDL 3 05/03/2023    Medications Reviewed Today     Reviewed by Lionell Jon DEL, RPH (Pharmacist) on 08/25/23 at 1047  Med List Status: <None>   Medication Order Taking? Sig Documenting Provider Last Dose Status Informant  acetaminophen  (TYLENOL ) 325 MG tablet [id number] Yes Take 2 tablets (650 mg total) by mouth every 6 (six) hours. Maurice Sharlet GORMAN, PA-C  Active   apixaban  (ELIQUIS ) 5 MG TABS tablet [id number] Yes Take two tablets by mouth on 05/15 pm and 05/16 am. Then decrease to one tablet twice a day. Maurice Sharlet GORMAN, PA-C  Active   B-D ALLERGY  SYRINGE 1CC/28G 28G X 1/2 1 ML MISC [id number] Yes USE AS DIRECTED ONCE WEEKLY Cheryl Waddell HERO, PA-C  Active Self  bethanechol  (URECHOLINE ) 10 MG tablet [id number] Yes Take 1 tablet (10 mg total) by mouth 3 (three) times daily. Maurice Sharlet GORMAN, PA-C  Active   Calcium  Carb-Cholecalciferol  (CALCIUM +D3 PO) [id number] Yes Take 1 tablet by mouth daily. [provider]  Active Self  carboxymethylcellulose (REFRESH PLUS) 0.5 % SOLN [id number] Yes Place 1 drop into both eyes as needed (dry eyes). [provider]  Active Self  diphenhydramine -acetaminophen  (TYLENOL  PM) 25-500 MG TABS tablet [id number] Yes Take 1 tablet by mouth at bedtime. [provider]  Active Self  famotidine  (PEPCID ) 40 MG tablet [id number] Yes Take 1 tablet (40 mg total) by mouth daily. Maurice Sharlet GORMAN, PA-C  Active   folic acid  (FOLVITE ) 1 MG tablet [id number] Yes Take 1 tablet (  1 mg total) by mouth daily. Cheryl Waddell HERO, PA-C  Active Self  lidocaine  (XYLOCAINE ) 5 % ointment [id number] Yes Apply 1 Application topically 2 (two) times daily as needed. Patel, Donika K, DO  Active   loperamide  (IMODIUM ) 2 MG capsule [id number] Yes Take 1 capsule (2 mg total) by mouth as needed for diarrhea or loose stools. Maurice Sharlet RAMAN, PA-C  Active   losartan  (COZAAR ) 25 MG tablet [id number] Yes Take 0.5 tablets (12.5 mg total) by mouth daily. Panosh, Apolinar POUR, MD  Active    methotrexate  50 MG/2ML injection [id number] Yes INJECT 0.3ML INTO THE SKIN ONCE WEEKLY Cheryl Waddell HERO, PA-C  Active Self  Multiple Vitamins-Minerals (MULTIVITAMIN ADULTS PO) [id number] Yes Take 1 tablet by mouth daily.  [provider]  Active Self  pantoprazole  (PROTONIX ) 40 MG tablet [id number] Yes Take 40 mg by mouth 2 (two) times daily. [provider]  Active Self  SIMPONI  50 MG/0.5ML EMMANUEL [id number] Yes INJECT 50MG  UNDER THE SKIN ONCE NAIDA Nash, Maya, MD  Active Self              Assessment/Plan:   Medication Management: -Providing 5 weeks of eliquis  samples in office today.  -Patient has follow up with PCP scheduled at end of next month. Has enough Eliquis  to finish 3 month treatment phase.  -If it is deemed that patient needs extended anticoagulation therapy, will need to consider switch to warfarin as eliquis  is not affordable and patient does not qualify for assistance program.    Follow Up Plan: as indicated after PCP visit  Jon VEAR [place], PharmD Clinical Pharmacist [phone]

## 2023-08-26 DIAGNOSIS — I739 Peripheral vascular disease, unspecified: Secondary | ICD-10-CM | POA: Diagnosis not present

## 2023-08-26 DIAGNOSIS — I469 Cardiac arrest, cause unspecified: Secondary | ICD-10-CM | POA: Diagnosis not present

## 2023-08-26 DIAGNOSIS — I82A13 Acute embolism and thrombosis of axillary vein, bilateral: Secondary | ICD-10-CM | POA: Diagnosis not present

## 2023-08-26 DIAGNOSIS — M549 Dorsalgia, unspecified: Secondary | ICD-10-CM | POA: Diagnosis not present

## 2023-08-26 DIAGNOSIS — G8929 Other chronic pain: Secondary | ICD-10-CM | POA: Diagnosis not present

## 2023-08-26 DIAGNOSIS — I10 Essential (primary) hypertension: Secondary | ICD-10-CM | POA: Diagnosis not present

## 2023-08-30 DIAGNOSIS — M797 Fibromyalgia: Secondary | ICD-10-CM | POA: Diagnosis not present

## 2023-08-30 DIAGNOSIS — M6281 Muscle weakness (generalized): Secondary | ICD-10-CM | POA: Diagnosis not present

## 2023-08-31 DIAGNOSIS — K559 Vascular disorder of intestine, unspecified: Secondary | ICD-10-CM | POA: Diagnosis not present

## 2023-09-01 DIAGNOSIS — M6281 Muscle weakness (generalized): Secondary | ICD-10-CM | POA: Diagnosis not present

## 2023-09-01 DIAGNOSIS — M797 Fibromyalgia: Secondary | ICD-10-CM | POA: Diagnosis not present

## 2023-09-06 DIAGNOSIS — M6281 Muscle weakness (generalized): Secondary | ICD-10-CM | POA: Diagnosis not present

## 2023-09-06 DIAGNOSIS — M797 Fibromyalgia: Secondary | ICD-10-CM | POA: Diagnosis not present

## 2023-09-13 DIAGNOSIS — M797 Fibromyalgia: Secondary | ICD-10-CM | POA: Diagnosis not present

## 2023-09-13 DIAGNOSIS — M6281 Muscle weakness (generalized): Secondary | ICD-10-CM | POA: Diagnosis not present

## 2023-09-15 DIAGNOSIS — M797 Fibromyalgia: Secondary | ICD-10-CM | POA: Diagnosis not present

## 2023-09-15 DIAGNOSIS — M6281 Muscle weakness (generalized): Secondary | ICD-10-CM | POA: Diagnosis not present

## 2023-09-21 DIAGNOSIS — M797 Fibromyalgia: Secondary | ICD-10-CM | POA: Diagnosis not present

## 2023-09-21 DIAGNOSIS — M6281 Muscle weakness (generalized): Secondary | ICD-10-CM | POA: Diagnosis not present

## 2023-09-23 DIAGNOSIS — K559 Vascular disorder of intestine, unspecified: Secondary | ICD-10-CM | POA: Diagnosis not present

## 2023-09-23 DIAGNOSIS — K219 Gastro-esophageal reflux disease without esophagitis: Secondary | ICD-10-CM | POA: Diagnosis not present

## 2023-09-23 DIAGNOSIS — K449 Diaphragmatic hernia without obstruction or gangrene: Secondary | ICD-10-CM | POA: Diagnosis not present

## 2023-09-23 DIAGNOSIS — D500 Iron deficiency anemia secondary to blood loss (chronic): Secondary | ICD-10-CM | POA: Diagnosis not present

## 2023-09-23 NOTE — Progress Notes (Signed)
 Office Visit Note  Patient: Jade Cardenas             Date of Birth: [birth year]           MRN: [id number]             PCP: Charlett Apolinar POUR, MD Referring: Charlett Apolinar POUR, MD Visit Date: 10/07/2023 Occupation: @GUAROCC @  Subjective:  Recent surgery and hospitalization   History of Present Illness: Jade Cardenas is a 73 y.o. female with history of psoriatic arthritis and osteoarthritis.  Patient remains on Simponi  50 mg every 28 days and methotrexate  0.3 ml every 7 days folic acid  1 mg p.o. daily.  Patient requested a refill of methotrexate  to be sent to the pharmacy today.  She denies any signs or symptoms of a current flare.  She is not experiencing any joint swelling at this time.  Patient had been holding both Simponi  and methotrexate  for 1 month prior to scheduled surgery on 06/29/2023. Patient was admitted on 06/29/2023 for an attempted hiatal hernia repair.  The operative course was complicated by significant bleeding and hiatus dissection.  The patient required 2 rounds of CPR due to cardiac arrest, laparotomy with ligation of splenic artery, 5 units of packed red blood cells, and gastric tube placement.  According to the patient she was hospitalized for 17 days.  The hospital course was complicated by a urinary tract infection due to urinary retention requiring Foley.  She noted requiring 4 additional units of PRBC.  She was admitted to the rehab center on 07/06/2023 for inpatient therapy.  Methotrexate  was reinitiated on 07/06/2023 and Simponi  was resumed on 07/07/2023.  While at the rehab center she was found to have a DVT and was started on heparin  and then transition to Eliquis  upon discharge.  Since being discharged home she has continued physical therapy twice a week.  Patient states that she has been getting her strength back and has been able to walk her dog on a daily basis.  She has been using a cane to assist with ambulation.  She continues to have bouts of diarrhea due to ischemic  colitis.  She is currently taking budesonide as prescribed and will be following the taper recommended by GI.    Activities of Daily Living:  Patient reports morning stiffness for 1 hour.   Patient Reports nocturnal pain.  Difficulty dressing/grooming: Denies Difficulty climbing stairs: Denies Difficulty getting out of chair: Denies Difficulty using hands for taps, buttons, cutlery, and/or writing: Denies  Review of Systems  Constitutional:  Positive for fatigue.  HENT:  Positive for mouth dryness. Negative for mouth sores.   Eyes:  Negative for dryness.  Respiratory:  Negative for shortness of breath.   Cardiovascular:  Negative for chest pain and palpitations.  Gastrointestinal:  Positive for constipation and diarrhea. Negative for blood in stool.  Endocrine: Negative for increased urination.  Genitourinary:  Negative for involuntary urination.  Musculoskeletal:  Positive for joint pain, joint pain, joint swelling, myalgias, muscle weakness, morning stiffness, muscle tenderness and myalgias. Negative for gait problem.  Skin:  Positive for sensitivity to sunlight. Negative for color change, rash and hair loss.  Allergic/Immunologic: Negative for susceptible to infections.  Neurological:  Negative for dizziness and headaches.  Hematological:  Negative for swollen glands.  Psychiatric/Behavioral:  Positive for sleep disturbance. Negative for depressed mood. The patient is not nervous/anxious.     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   Fibromyalgia 05/17/2016   DJD (degenerative joint disease), cervical 02/18/2016   Heel spur 09/29/2015   Ingrown toenail 09/29/2015   Foraminal stenosis of cervical region 07/30/2015   Microcytic hypochromic anemia 05/22/2015   Tailor's bunion of left foot 01/18/2015   H/O hiatal hernia    Bilateral primary osteoarthritis of knee 12/15/2013   Iron deficiency 12/15/2013   Psoriatic arthritis (HCC) 12/15/2013   History of foot fracture 12/15/2013   Dermatitis 10/17/2013   Hx of diethylstilbestrol (DES) exposure in utero unknown 12/09/2012   Tinnitus 06/23/2012   Scalp lesion 01/21/2012   Headache 01/21/2012   Hx of psoriasis 01/21/2012   Weight gain 01/21/2012   Ankylosing spondylitis  possible 09/03/2011   Hx of transfusion 09/03/2011   S/P lumbar fusion 6 13 09/03/2011   Lumbar stenosis with neurogenic claudication 08/05/2011   Allergic rhinitis 02/19/2010   GERD 02/19/2010   SPINAL STENOSIS, CERVICAL 02/19/2010   Disorder of bone and cartilage 02/19/2010   IBS 04/28/2007   History of gastric ulcer 02/09/2007   History of colonic polyps 02/09/2007   CHOLECYSTECTOMY, HX OF 02/09/2007   Hyperlipidemia 12/29/2006   Essential hypertension 12/29/2006    Past Medical History:  Diagnosis Date   Anemia    iron def   Arthritis    rheumatoid, psoriatic, osteoarthritis    Blood clot in vein 07/01/2023   both legs   Bone spur    RIGHT FOOT    Broken toe    Bruises easily    Cellulitis    Chronic back pain    COLONIC POLYPS, HX OF 02/09/2007   Congestive heart failure (HCC) 07/01/2023   Cough, persistent 01/21/2012   poss from acei    DES exposure in utero, unknown    Femur fracture, left (HCC)    Fibromyalgia    doesn't require meds   GASTRIC ULCER, ACUTE, HEMORRHAGE, HX OF 02/09/2007   GERD (gastroesophageal reflux disease)    takes Nexium  daily   Goiter     H/O hiatal hernia    H/O: hysterectomy    when 73 yrs old   Headache    Occasional migraines with increased stress   Hx of seasonal allergies    takes Claritin  daily   HYPERLIPIDEMIA 12/29/2006   takes Lovastatin  nightly   HYPERTENSION 12/29/2006   takes Enalapril  daily   IBS 04/28/2007   Insomnia    related to pain;takes Flexeril  and Tylenol  PM nightly   Ischemic colitis (HCC) 07/01/2023   Joint pain    Joint swelling    Neuropathy    small cell   Osteopenia    Palpitations 02/09/2007   PONV (postoperative nausea and vomiting)    NAUSEA   Pulmonary edema 07/01/2023   REDUCTION MAMMOPLASTY, HX OF 02/09/2007   Right bundle branch block 02/09/2007   S/P lumbar fusion 6 13 09/03/2011   L4 L5  posterior    SYNCOPE 12/07/2007   Tendonitis    RLE    UNS ADVRS EFF OTH RX MEDICINAL\T\BIOLOGICAL  SBSTNC 02/09/2007    Family History  Problem Relation Age of Onset   Diabetes Mother    Colon cancer Mother        x2   Arthritis Mother    Hypertension Mother    Endometrial cancer Mother    Stroke Father    Heart disease Father    Colon cancer Father    Arthritis Brother    Hypertension Brother    Hyperlipidemia Brother    Infertility Brother    Diabetes Brother    Other Other        DISH brother   Hypertension Maternal Grandmother    Stroke Maternal Grandmother    Anesthesia problems Neg Hx    Hypotension Neg Hx    Malignant hyperthermia Neg Hx    Pseudochol deficiency Neg Hx    Past Surgical History:  Procedure Laterality Date   BACK SURGERY  08/2011   lumbar L4-L5 fusion, lamenectomy   bone spur  6-42yrs ago   right ankle   BUNIONECTOMY  10/03/2014   right foot   BUNIONECTOMY Left    CERVICAL FUSION     2 12    CHOLECYSTECTOMY  1996   HARDWARE REMOVAL Left 08/01/2018   Procedure: Left knee hardware removal;  Surgeon: Melodi Lerner, MD;  Location: [facility];  Service: Orthopedics;  Laterality: Left;    HIATAL HERNIA REPAIR  06/29/2023   LAPAROTOMY N/A  06/29/2023   Procedure: LAPAROTOMY, EXPLORATORY W/LIGATION SPLENIC ARTERY AND GASTRIC TUBE PLACEMENT;  Surgeon: Rubin Calamity, MD;  Location: [facility] OR;  Service: General;  Laterality: N/A;   POSTERIOR CERVICAL LAMINECTOMY Left 07/30/2015   Procedure: Laminectomy and Foraminotomy - left - Cervical two -Cervical three;  Surgeon: Victory Gunnels, MD;  Location: [facility];  Service: Neurosurgery;  Laterality: Left;   REDUCTION MAMMAPLASTY Bilateral 1989   TONSILLECTOMY     as a child   TOTAL ABDOMINAL HYSTERECTOMY W/ BILATERAL SALPINGOOPHORECTOMY  1986   TOTAL KNEE ARTHROPLASTY N/A 06/21/2017   Procedure: LEFT TOTAL KNEE ARTHROPLASTY AND RIGHT KNEE CORTISONE INJECTION;  Surgeon: Melodi Lerner, MD;  Location: [facility];  Service: Orthopedics;  Laterality: N/A;   XI ROBOTIC ASSISTED HIATAL HERNIA REPAIR N/A 06/29/2023   Procedure: ATTEMPTED XI ROBOTIC ASSISTED HIATAL HERNIA REPAIR WITH FUNDOPLICATION AND MESH;  Surgeon: Rubin Calamity, MD;  Location[facility];  Service: General;  Laterality: N/A;  NO MESH INSERTED   Social History   Social History Narrative   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  Husband died suddenly 2010Non smoker         Left handed    Lives in a one story home          Immunization History  Administered Date(s) Administered   Fluad Quad(high Dose 65+) 11/04/2018, 11/07/2019, 11/21/2020, 11/11/2021   Fluad Trivalent(High Dose 65+) 11/12/2022   Influenza Split 11/29/2012, 11/27/2013   Influenza Whole 11/09/2008   Influenza, High Dose Seasonal PF 11/26/2015, 11/30/2016, 11/03/2017, 12/05/2020   Influenza,inj,Quad PF,6+ Mos 11/27/2014   Influenza-Unspecified 12/22/2016   Moderna Covid-19 Fall Seasonal Vaccine 55yrs & older 11/18/2022   PFIZER Comirnaty(Gray Top)Covid-19 Tri-Sucrose Vaccine 03/28/2020, 11/24/2021   PFIZER(Purple Top)SARS-COV-2 Vaccination 04/08/2019, 05/03/2019, 10/19/2019, 03/28/2020   PPD Test  12/18/2013, 11/27/2014, 11/26/2015, 11/30/2016   Pfizer Covid-19 Vaccine Bivalent Booster 32yrs & up 11/21/2020   Pneumococcal Conjugate-13 02/28/2013   Pneumococcal Polysaccharide-23 03/03/2003, 02/28/2014, 11/07/2019   Respiratory Syncytial Virus Vaccine,Recomb Aduvanted(Arexvy) 12/12/2021   Td 05/09/2008  Tdap 07/19/2018   Zoster Recombinant(Shingrix ) 12/03/2016, 02/04/2017   Zoster, Live 01/20/2011     Objective: Vital Signs: BP 138/76 (BP Location: Left Arm, Patient Position: Sitting, Cuff Size: Normal)   Pulse 89   Ht 5' 5 (1.651 m)   Wt 191 lb 6.4 oz (86.8 kg)   LMP 03/02/1984   BMI 31.85 kg/m    Physical Exam Vitals and nursing note reviewed.  Constitutional:      Appearance: She is well-developed.  HENT:     Head: Normocephalic and atraumatic.  Eyes:     Conjunctiva/sclera: Conjunctivae normal.  Cardiovascular:     Rate and Rhythm: Normal rate and regular rhythm.     Heart sounds: Normal heart sounds.  Pulmonary:     Effort: Pulmonary effort is normal.     Breath sounds: Normal breath sounds.  Abdominal:     General: Bowel sounds are normal.     Palpations: Abdomen is soft.  Musculoskeletal:     Cervical back: Normal range of motion.  Lymphadenopathy:     Cervical: No cervical adenopathy.  Skin:    General: Skin is warm and dry.     Capillary Refill: Capillary refill takes less than 2 seconds.  Neurological:     Mental Status: She is alert and oriented to person, place, and time.  Psychiatric:        Behavior: Behavior normal.      Musculoskeletal Exam: Patient remained seated during the examination. Thoracic kyphosis noted. Shoulder joints, elbow joints, wrist joints, MCPs, PIPs, and DIPs good ROM with no synovitis.  PIP and DIP thickening. Complete formation bilaterally.  Left knee replacement has good ROM. Right knee has good ROM with no warmth or effusion.  Ankle joints have good ROM with no tenderness or joint swelling. No evidence of achilles  tendonitis.   CDAI Exam: CDAI Score: -- Patient Global: --; Provider Global: -- Swollen: --; Tender: -- Joint Exam 10/07/2023   No joint exam has been documented for this visit   There is currently no information documented on the homunculus. Go to the Rheumatology activity and complete the homunculus joint exam.  Investigation: No additional findings.  Imaging: No results found.  Recent Labs: Lab Results  Component Value Date   WBC 7.3 09/28/2023   HGB 11.2 (L) 09/28/2023   PLT 418.0 (H) 09/28/2023   NA 138 07/28/2023   K 4.3 07/28/2023   CL 100 07/28/2023   CO2 21 07/28/2023   GLUCOSE 121 (H) 07/28/2023   BUN 15 07/28/2023   CREATININE 0.68 07/28/2023   BILITOT 0.5 07/28/2023   ALKPHOS 97 07/28/2023   AST 11 07/28/2023   ALT 11 07/28/2023   PROT 6.9 07/28/2023   ALBUMIN  4.2 07/28/2023   CALCIUM  9.7 07/28/2023   GFRAA 87 02/02/2020   QFTBGOLDPLUS NEGATIVE 09/28/2023    Speciality Comments: Failed therapy: Arava and Otezla due to diarrhea Enbrel, Humira-neuropathy  Procedures:  No procedures performed Allergies: Erythromycin, Iodinated contrast media, Ace inhibitors, Indomethacin, Keflex [cephalexin], and Macrobid  [nitrofurantoin ]   Assessment / Plan:     Visit Diagnoses: Psoriatic arthritis (HCC): No synovitis or dactylitis noted on examination today.  No evidence of Achilles tendinitis or plantar fasciitis.  No SI joint tenderness upon palpation.  No active psoriasis at this time.  Patient is currently on Simponi  50 mg sq injections once every 28 days and methotrexate  0.3 mg sq injections once weekly.  Patient had held both Simponi  and methotrexate  for a little over 1 month due to  a scheduled hiatal hernia repair on 06/29/2023.  She has several complications requiring  hospitalization/rehab for 17 days.  She had resumed methotrexate  on 07/06/2023 and Simponi  on 07/07/2023.  She has not had any signs or symptoms of a flare.  No medication changes will be made at this  time.  She was advised to notify us  if she develops signs or symptoms of a flare.  She will follow-up in the office in 5 months or sooner if needed.  Hx of psoriasis: No active psoriasis at this time.   High risk medication use - Simponi  50 mg every 28 days and methotrexate  0.3 ml every 7 days folic acid  1 mg p.o. daily..(Arava and Otezla caused diarrhea). Methotrexate  restarted on 07/06/2023.  Simponi  restarted on 07/07/2023. Patient had been holding both medications for 1 month prior to attempted surgery on 06/29/23.  TB gold negative on 09/28/23. CBC updated on 09/28/23 CMP updated on 08/25/23.   Discussed the importance of holding simponi  and methotrexate  if she develops signs or symptoms of an infection and to resume once the infection has completely cleared.   Primary osteoarthritis of both hands: PIP and DIP thickening.  No synovitis or dactylitis noted.   Primary osteoarthritis of right knee: No warmth or effusion.  Using a cane to assist with ambulation.   Total knee replacement status, left - Dr. Melodi. Doing well.  No effusion noted.   Primary osteoarthritis of both feet: Good ROM of both ankles with no tenderness or joint swelling.  Patient has noticed a progression of paresthesias involving both feet.  DDD (degenerative disc disease), cervical - - S/P fusion x2: Limited ROM.   Spondylosis of lumbar spine - - S/P fusion  Small fiber neuropathy - She discontinued Cymbalta  and tried Lyrica --could not tolerate the side effects.  She has noticed some progression of paresthesias in her feet since her recent hospitalization.  Fibromyalgia: Intermittent myalgias and muscle tenderness.   Chronic fatigue: She has gradually been gaining her energy level and strength back since being hospitalized.  Hiatal hernia: Admitted to hospital on 06/29/23 for an attempted robotic hernia repair--operative course complicated by significant bleeding in midportion of stomach after stomach reduced and hiatus  dissected.  She required 2 rounds of CPR due to cardiac arrest, laparotomy with ligation of splenic artery, 5 units of PRBC and gastric tube placement. While hospitalized she experienced urinary retention leading to UTI. She was admitted to rehab on 07/06/2023--methotrexate  resumed on 07/06/2023 and Simponi  resumed on 07/07/2023. Developed chest pain and a cough--due to fluid overload-prescribed Lasix  x 5 days. Bilateral lower extremity Dopplers performed due to lower extremity edema and immobility.  Patient was found to have several DVTs.  She was started on heparin  and then transition to Eliquis . She has remained on Eliquis  outpatient.  She has been going to physical therapy twice a week.  She is starting to gain her strength back and has been using a cane to assist with ambulation. She continues to have bouts of abdominal cramping and diarrhea which she attributes to ischemic colitis.  She is currently taking budesonide as prescribed.  History of DVT (deep vein thrombosis): During recent hospitalization--currently on Eliquis .  Other medical conditions are listed as follows:  Osteopenia of multiple sites - Followed by her GYN.  Updated DXA on 11/14/20: The BMD measured at DualFemur Neck Right is 0.829 g/cm2 with a T-score of -1.5.  Calcium  rich diet and vitamin D   Other iron deficiency anemia    Orders: No orders of  the defined types were placed in this encounter.  Meds ordered this encounter  Medications   methotrexate  50 MG/2ML injection    Sig: INJECT 0.3ML INTO THE SKIN ONCE WEEKLY    Dispense:  6 mL    Refill:  0    Follow-Up Instructions: Return in about 5 months (around 03/08/2024) for Psoriatic arthritis, Osteoarthritis.   Waddell CHRISTELLA Craze, PA-C  Note - This record has been created using Dragon software.  Chart creation errors have been sought, but may not always  have been located. Such creation errors do not reflect on  the standard of medical care.

## 2023-09-24 DIAGNOSIS — M6281 Muscle weakness (generalized): Secondary | ICD-10-CM | POA: Diagnosis not present

## 2023-09-24 DIAGNOSIS — M797 Fibromyalgia: Secondary | ICD-10-CM | POA: Diagnosis not present

## 2023-09-28 ENCOUNTER — Encounter: Payer: Self-pay | Admitting: Internal Medicine

## 2023-09-28 ENCOUNTER — Ambulatory Visit (INDEPENDENT_AMBULATORY_CARE_PROVIDER_SITE_OTHER): Admitting: Internal Medicine

## 2023-09-28 VITALS — BP 120/60 | HR 84 | Temp 97.9°F | Ht 65.0 in | Wt 190.4 lb

## 2023-09-28 DIAGNOSIS — Z79899 Other long term (current) drug therapy: Secondary | ICD-10-CM

## 2023-09-28 DIAGNOSIS — M6281 Muscle weakness (generalized): Secondary | ICD-10-CM | POA: Diagnosis not present

## 2023-09-28 DIAGNOSIS — R7989 Other specified abnormal findings of blood chemistry: Secondary | ICD-10-CM

## 2023-09-28 DIAGNOSIS — I10 Essential (primary) hypertension: Secondary | ICD-10-CM | POA: Diagnosis not present

## 2023-09-28 DIAGNOSIS — Z7901 Long term (current) use of anticoagulants: Secondary | ICD-10-CM | POA: Diagnosis not present

## 2023-09-28 DIAGNOSIS — D649 Anemia, unspecified: Secondary | ICD-10-CM

## 2023-09-28 DIAGNOSIS — M797 Fibromyalgia: Secondary | ICD-10-CM | POA: Diagnosis not present

## 2023-09-28 MED ORDER — APIXABAN 2.5 MG PO TABS: 2.5000 mg | ORAL_TABLET | Freq: Two times a day (BID) | ORAL | Status: DC

## 2023-09-28 NOTE — Progress Notes (Signed)
 Chief Complaint  Patient presents with   Medical Management of Chronic Issues    Pt is here for 2 months f/u. Pt reports she sees GI for ischemic colitis. Would like to discuss with provider on BP med and weaning off of the eliquis .     HPI: Jade Cardenas 73 y.o. come in for Chronic disease management Fu   see last note  I ssp h repair with complications bleeding and interoperative card arrest. And dx ischemic colitis  And on budesonide   for 3 months and wean.   Seems to have helped her arthritis sx on this steroid...  Since that time  fu Gi team  7 24 given prn levbid for abd pain nd diarrhea cramping  as needed .  Had elevated ferritin but felt to be  inflammatory cause  No bleeding  is now out 3 mos from dvt   asks about stopping or adjusting eliquis  dose has been on samples   Bp is ok  on 12.5 of losartan  .  ROS: See pertinent positives and negatives per HPI.eatin regular mostly  has lost some weight  using cane  .   Past Medical History:  Diagnosis Date   Anemia    iron def   Arthritis    rheumatoid, psoriatic, osteoarthritis    Bone spur    RIGHT FOOT    Broken toe    Bruises easily    Cellulitis    Chronic back pain    COLONIC POLYPS, HX OF 02/09/2007   Cough, persistent 01/21/2012   poss from acei    DES exposure in utero, unknown    Femur fracture, left (HCC)    Fibromyalgia    doesn't require meds   GASTRIC ULCER, ACUTE, HEMORRHAGE, HX OF 02/09/2007   GERD (gastroesophageal reflux disease)    takes Nexium  daily   Goiter    H/O hiatal hernia    H/O: hysterectomy    when 73 yrs old   Headache    Occasional migraines with increased stress   Hx of seasonal allergies    takes Claritin  daily   HYPERLIPIDEMIA 12/29/2006   takes Lovastatin  nightly   HYPERTENSION 12/29/2006   takes Enalapril  daily   IBS 04/28/2007   Insomnia    related to pain;takes Flexeril  and Tylenol  PM nightly   Joint pain    Joint swelling    Neuropathy    small cell    Osteopenia    Palpitations 02/09/2007   PONV (postoperative nausea and vomiting)    NAUSEA   REDUCTION MAMMOPLASTY, HX OF 02/09/2007   Right bundle branch block 02/09/2007   S/P lumbar fusion 6 13 09/03/2011   L4 L5  posterior    SYNCOPE 12/07/2007   Tendonitis    RLE    UNS ADVRS EFF OTH RX MEDICINAL\T\BIOLOGICAL SBSTNC 02/09/2007    Family History  Problem Relation Age of Onset   Diabetes Mother    Colon cancer Mother        x2   Arthritis Mother    Hypertension Mother    Endometrial cancer Mother    Stroke Father    Heart disease Father    Colon cancer Father    Arthritis Brother    Hypertension Brother    Hyperlipidemia Brother    Infertility Brother    Diabetes Brother    Other Other        DISH brother   Hypertension Maternal Grandmother    Stroke Maternal Grandmother  Anesthesia problems Neg Hx    Hypotension Neg Hx    Malignant hyperthermia Neg Hx    Pseudochol deficiency Neg Hx     Social History   Socioeconomic History   Marital status: Widowed    Spouse name: Not on file   Number of children: 0   Years of education: Not on file   Highest education level: Master's degree (e.g., MA, MS, MEng, MEd, MSW, MBA)  Occupational History   Not on file  Tobacco Use   Smoking status: Never    Passive exposure: Never   Smokeless tobacco: Never  Vaping Use   Vaping status: Never Used  Substance and Sexual Activity   Alcohol use: No    Alcohol/week: 0.0 standard drinks of alcohol   Drug use: No   Sexual activity: Not Currently    Partners: Male    Birth control/protection: Surgical    Comment: TAH/BSO  Other Topics Concern   Not on file  Social History Narrative   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 : Never true  Transportation Needs: No Transportation Needs (09/23/2023)   Received from [facility]    In the past 12 months, has lack of reliable transportation kept you from medical appointments, meetings, work or from getting things needed for daily living? : No  Physical Activity: Insufficiently Active (05/08/2023)   Exercise Vital Sign    Days of Exercise per Week: 7 days    Minutes of Exercise per Session: 20 min  Stress: Stress Concern Present (05/08/2023)   [employer] of Occupational Health - Occupational Stress Questionnaire    Feeling of Stress : To some extent  Social Connections: Patient Unable To Answer (06/29/2023)   Social Connection and Isolation Panel    Frequency of Communication with Friends and Family: Patient unable to answer    Frequency of Social Gatherings with Friends and Family: Patient unable to answer    Attends Religious Services: Patient unable to answer    Active Member of Clubs or Organizations: Patient unable to answer    Attends Banker Meetings: Patient unable to answer    Marital Status: Patient unable to answer    Outpatient Medications Prior to Visit  Medication Sig Dispense Refill   acetaminophen  (TYLENOL ) 325 MG tablet Take 2 tablets (650 mg total) by mouth every 6 (six) hours.     B-D ALLERGY  SYRINGE 1CC/28G 28G X 1/2 1 ML MISC USE AS DIRECTED ONCE WEEKLY 12 each 3   budesonide (ENTOCORT EC) 3 MG 24 hr capsule Take 3 mg by mouth. Take 3 capsules (9 mg total) by mouth daily for 30 days, THEN 2 capsules (6 mg total)  daily  for 30 days, THEN 1 capsule (3 mg total) daily.     Calcium  Carb-Cholecalciferol  (CALCIUM +D3 PO) Take 1 tablet by mouth daily.     carboxymethylcellulose (REFRESH PLUS) 0.5 % SOLN Place 1 drop into both eyes as needed (dry eyes).     diphenhydramine -acetaminophen  (TYLENOL  PM) 25-500 MG TABS tablet Take 1 tablet by mouth at bedtime.     famotidine  (PEPCID ) 40 MG tablet Take 1 tablet (40 mg total) by mouth daily. 30 tablet 0   folic acid  (FOLVITE ) 1 MG tablet Take 1 tablet (1 mg total) by mouth daily. 90 tablet 3   hyoscyamine (ANASPAZ) 0.125 MG TBDP disintergrating tablet Place 0.125 mg under the tongue. Place 1 tablet (0.125 mg total) under the tongue every 4 (four) hours as needed (abdominal pain and cramping).     loperamide  (IMODIUM ) 2 MG capsule Take 1 capsule (2 mg total) by mouth as needed for diarrhea or loose stools. 30 capsule 0   losartan  (COZAAR ) 25 MG tablet Take 0.5 tablets (12.5 mg total) by mouth daily. 30 tablet 3   methotrexate  50 MG/2ML injection INJECT 0.3ML INTO THE SKIN ONCE WEEKLY 6 mL 0   Multiple Vitamins-Minerals (MULTIVITAMIN ADULTS PO) Take 1 tablet by mouth daily.      pantoprazole  (PROTONIX ) 40 MG tablet Take 40 mg by mouth 2 (two) times daily.     SIMPONI  50 MG/0.5ML SOAJ INJECT 50MG  UNDER THE SKIN ONCE MONTHLY 0.5 mL 4   apixaban  (ELIQUIS ) 5 MG TABS tablet Take two tablets by mouth on 05/15 pm and 05/16 am. Then decrease to one tablet twice a day. 64 tablet 0   bethanechol  (URECHOLINE ) 10 MG tablet Take 1 tablet (10 mg total) by mouth 3 (three) times daily. (Patient not taking: Reported on 09/28/2023) 90 tablet 0   lidocaine  (XYLOCAINE ) 5 % ointment Apply 1 Application topically 2 (two) times daily as needed. 35.44 g 3   No facility-administered medications prior to visit.     EXAM:  BP 120/60 (BP Location: Right Arm, Patient Position: Sitting, Cuff Size: Large)   Pulse 84   Temp 97.9 F (36.6 C) (Oral)   Ht 5' 5 (1.651 m)   Wt 190 lb 6.4 oz (86.4 kg)    LMP 03/02/1984   SpO2 98%   BMI 31.68 kg/m   Body mass index is 31.68 kg/m.  GENERAL: vitals reviewed and listed above, alert, oriented, appears well hydrated and in no acute distress HEENT: atraumatic, conjunctiva  clear, no obvious abnormalities on inspection of external nose and ears NECK: no obvious masses on inspection palpation  LUNGS: clear to auscultation bilaterally, no wheezes, rales or rhonchi, good air movement mild kyphosis  CV: HRRR, no clubbing cyanosis or  peripheral edema nl cap refill  MS: moves all extremities Abd healing mid line scare and  drain   os healing  over Skin color normal  PSYCH: pleasant and cooperative, no obvious depression or anxiety Lab Results  Component Value Date   WBC 8.4 07/28/2023   HGB 10.4 (L) 07/28/2023   HCT 32.6 (L) 07/28/2023   PLT 450 07/28/2023   GLUCOSE 121 (H) 07/28/2023   CHOL 181 05/03/2023   TRIG 140 06/30/2023   HDL 70.60 05/03/2023   LDLDIRECT 114.0 04/02/2010   LDLCALC 82 05/03/2023   ALT 11 07/28/2023   AST 11 07/28/2023   NA 138 07/28/2023   K 4.3 07/28/2023   CL 100 07/28/2023   CREATININE 0.68 07/28/2023   BUN 15 07/28/2023   CO2  21 07/28/2023   TSH 1.28 05/03/2023   INR 1.1 07/05/2023   HGBA1C 5.8 05/03/2023   BP Readings from Last 3 Encounters:  09/28/23 120/60  08/12/23 122/78  08/10/23 (!) 151/72  Labs from [facility] June 25 hg 11.6   ASSESSMENT AND PLAN:  Discussed the following assessment and plan:  Anemia, unspecified type - Plan: CBC with Differential/Platelet, Iron, TIBC and Ferritin Panel, QuantiFERON-TB Gold Plus  Elevated ferritin level - Plan: CBC with Differential/Platelet, Iron, TIBC and Ferritin Panel, QuantiFERON-TB Gold Plus  High risk medication use - Plan: QuantiFERON-TB Gold Plus  Medication management  Essential hypertension - stay on 12.5 losartan  for now  Chronic anticoagulation Update  from last month  rest reviewed   Suggest  down dose the eliquis   2.5  samples  giv` for 6 weeks worth  and  if  no ci maybe wait until a 6 month dc since related to hosp and predicament . Requested op note info and  printed for patient  Plan fu in September as planned  -Patient advised to return or notify health care team  if  new concerns arise.  Patient Instructions  Good to see you today  Eliquis  2.5 twice a day for now. Lab today . See you in  September    Jermain Curt K. Guadalupe Kerekes M.D.

## 2023-09-28 NOTE — Patient Instructions (Addendum)
 Good to see you today  Eliquis  2.5 twice a day for now. Lab today . See you in  September

## 2023-09-29 LAB — CBC WITH DIFFERENTIAL/PLATELET
Basophils Absolute: 0.1 K/uL (ref 0.0–0.1)
Basophils Relative: 1.4 % (ref 0.0–3.0)
Eosinophils Absolute: 0.1 K/uL (ref 0.0–0.7)
Eosinophils Relative: 1.4 % (ref 0.0–5.0)
HCT: 33.6 % — ABNORMAL LOW (ref 36.0–46.0)
Hemoglobin: 11.2 g/dL — ABNORMAL LOW (ref 12.0–15.0)
Lymphocytes Relative: 25 % (ref 12.0–46.0)
Lymphs Abs: 1.8 K/uL (ref 0.7–4.0)
MCHC: 33.4 g/dL (ref 30.0–36.0)
MCV: 102.5 fl — ABNORMAL HIGH (ref 78.0–100.0)
Monocytes Absolute: 0.5 K/uL (ref 0.1–1.0)
Monocytes Relative: 6.8 % (ref 3.0–12.0)
Neutro Abs: 4.7 K/uL (ref 1.4–7.7)
Neutrophils Relative %: 65.4 % (ref 43.0–77.0)
Platelets: 418 K/uL — ABNORMAL HIGH (ref 150.0–400.0)
RBC: 3.28 Mil/uL — ABNORMAL LOW (ref 3.87–5.11)
RDW: 15.8 % — ABNORMAL HIGH (ref 11.5–15.5)
WBC: 7.3 K/uL (ref 4.0–10.5)

## 2023-09-30 DIAGNOSIS — M797 Fibromyalgia: Secondary | ICD-10-CM | POA: Diagnosis not present

## 2023-09-30 DIAGNOSIS — M6281 Muscle weakness (generalized): Secondary | ICD-10-CM | POA: Diagnosis not present

## 2023-10-01 LAB — QUANTIFERON-TB GOLD PLUS
Mitogen-NIL: 0.98 [IU]/mL
NIL: 0.02 [IU]/mL
QuantiFERON-TB Gold Plus: NEGATIVE
TB1-NIL: 0 [IU]/mL
TB2-NIL: 0 [IU]/mL

## 2023-10-01 LAB — IRON,TIBC AND FERRITIN PANEL
%SAT: 17 % (ref 16–45)
Ferritin: 179 ng/mL (ref 16–288)
Iron: 52 ug/dL (ref 45–160)
TIBC: 304 ug/dL (ref 250–450)

## 2023-10-03 ENCOUNTER — Ambulatory Visit: Payer: Self-pay | Admitting: Internal Medicine

## 2023-10-03 NOTE — Progress Notes (Signed)
 Anemia  improved fro 2 mos ago ;hg now 11.2  Iron sat lowest level  in reference range . Quantiferon neg Forwarding  info to team as [employer]

## 2023-10-04 DIAGNOSIS — M6281 Muscle weakness (generalized): Secondary | ICD-10-CM | POA: Diagnosis not present

## 2023-10-04 DIAGNOSIS — M797 Fibromyalgia: Secondary | ICD-10-CM | POA: Diagnosis not present

## 2023-10-06 DIAGNOSIS — M797 Fibromyalgia: Secondary | ICD-10-CM | POA: Diagnosis not present

## 2023-10-06 DIAGNOSIS — M6281 Muscle weakness (generalized): Secondary | ICD-10-CM | POA: Diagnosis not present

## 2023-10-07 ENCOUNTER — Encounter: Payer: Self-pay | Admitting: Physician Assistant

## 2023-10-07 ENCOUNTER — Ambulatory Visit: Attending: Physician Assistant | Admitting: Physician Assistant

## 2023-10-07 VITALS — BP 138/76 | HR 89 | Ht 65.0 in | Wt 191.4 lb

## 2023-10-07 DIAGNOSIS — M8589 Other specified disorders of bone density and structure, multiple sites: Secondary | ICD-10-CM | POA: Diagnosis not present

## 2023-10-07 DIAGNOSIS — K449 Diaphragmatic hernia without obstruction or gangrene: Secondary | ICD-10-CM | POA: Diagnosis not present

## 2023-10-07 DIAGNOSIS — M5030 Other cervical disc degeneration, unspecified cervical region: Secondary | ICD-10-CM | POA: Diagnosis not present

## 2023-10-07 DIAGNOSIS — M19042 Primary osteoarthritis, left hand: Secondary | ICD-10-CM | POA: Diagnosis not present

## 2023-10-07 DIAGNOSIS — M47816 Spondylosis without myelopathy or radiculopathy, lumbar region: Secondary | ICD-10-CM | POA: Insufficient documentation

## 2023-10-07 DIAGNOSIS — Z86718 Personal history of other venous thrombosis and embolism: Secondary | ICD-10-CM | POA: Diagnosis not present

## 2023-10-07 DIAGNOSIS — M19072 Primary osteoarthritis, left ankle and foot: Secondary | ICD-10-CM | POA: Insufficient documentation

## 2023-10-07 DIAGNOSIS — Z79899 Other long term (current) drug therapy: Secondary | ICD-10-CM | POA: Insufficient documentation

## 2023-10-07 DIAGNOSIS — M19071 Primary osteoarthritis, right ankle and foot: Secondary | ICD-10-CM | POA: Diagnosis not present

## 2023-10-07 DIAGNOSIS — M19041 Primary osteoarthritis, right hand: Secondary | ICD-10-CM | POA: Insufficient documentation

## 2023-10-07 DIAGNOSIS — L4050 Arthropathic psoriasis, unspecified: Secondary | ICD-10-CM | POA: Insufficient documentation

## 2023-10-07 DIAGNOSIS — G629 Polyneuropathy, unspecified: Secondary | ICD-10-CM | POA: Insufficient documentation

## 2023-10-07 DIAGNOSIS — Z872 Personal history of diseases of the skin and subcutaneous tissue: Secondary | ICD-10-CM | POA: Insufficient documentation

## 2023-10-07 DIAGNOSIS — R5382 Chronic fatigue, unspecified: Secondary | ICD-10-CM | POA: Insufficient documentation

## 2023-10-07 DIAGNOSIS — D508 Other iron deficiency anemias: Secondary | ICD-10-CM | POA: Diagnosis not present

## 2023-10-07 DIAGNOSIS — M797 Fibromyalgia: Secondary | ICD-10-CM | POA: Diagnosis not present

## 2023-10-07 DIAGNOSIS — M1711 Unilateral primary osteoarthritis, right knee: Secondary | ICD-10-CM | POA: Diagnosis not present

## 2023-10-07 DIAGNOSIS — Z96652 Presence of left artificial knee joint: Secondary | ICD-10-CM | POA: Diagnosis not present

## 2023-10-07 MED ORDER — METHOTREXATE SODIUM CHEMO INJECTION 50 MG/2ML: INTRAMUSCULAR | 0 refills | Status: DC

## 2023-10-11 DIAGNOSIS — M797 Fibromyalgia: Secondary | ICD-10-CM | POA: Diagnosis not present

## 2023-10-11 DIAGNOSIS — M6281 Muscle weakness (generalized): Secondary | ICD-10-CM | POA: Diagnosis not present

## 2023-10-12 ENCOUNTER — Encounter: Payer: Self-pay | Admitting: Hematology

## 2023-10-13 ENCOUNTER — Encounter: Payer: Self-pay | Admitting: Internal Medicine

## 2023-10-13 ENCOUNTER — Telehealth: Payer: Self-pay

## 2023-10-13 DIAGNOSIS — K559 Vascular disorder of intestine, unspecified: Secondary | ICD-10-CM | POA: Diagnosis not present

## 2023-10-13 DIAGNOSIS — M6281 Muscle weakness (generalized): Secondary | ICD-10-CM | POA: Diagnosis not present

## 2023-10-13 DIAGNOSIS — M797 Fibromyalgia: Secondary | ICD-10-CM | POA: Diagnosis not present

## 2023-10-13 NOTE — Telephone Encounter (Signed)
 Returned c all to pt regarding MyChart message. Gave pt option of seeing if PCP would order iron infusions or pt could come in for labs and an MD visit with Dr Onesimo since she hasn't been seen in a while. Pt to call PCP and see if they will order iron. Pt will call office back if she needs to schedule an appointment.

## 2023-10-20 ENCOUNTER — Encounter: Payer: Self-pay | Admitting: Internal Medicine

## 2023-10-20 ENCOUNTER — Encounter: Payer: Self-pay | Admitting: Hematology

## 2023-10-20 DIAGNOSIS — M6281 Muscle weakness (generalized): Secondary | ICD-10-CM | POA: Diagnosis not present

## 2023-10-20 DIAGNOSIS — M797 Fibromyalgia: Secondary | ICD-10-CM | POA: Diagnosis not present

## 2023-10-20 DIAGNOSIS — R5383 Other fatigue: Secondary | ICD-10-CM

## 2023-10-20 DIAGNOSIS — D649 Anemia, unspecified: Secondary | ICD-10-CM

## 2023-10-20 DIAGNOSIS — D509 Iron deficiency anemia, unspecified: Secondary | ICD-10-CM

## 2023-10-20 NOTE — Telephone Encounter (Signed)
 We will  sending this order and planning  iron infusion. Different location.   So is Dr Onesimo not  seeing you  any more unless a new problem?

## 2023-10-20 NOTE — Telephone Encounter (Signed)
 See other message  ordering iron infusion  not in  hematology division.as requested .

## 2023-10-20 NOTE — Telephone Encounter (Signed)
 Iron infusion order is fax to short stay. Pt is contacted. Please see other encounter.

## 2023-10-21 ENCOUNTER — Telehealth: Payer: Self-pay | Admitting: Internal Medicine

## 2023-10-21 NOTE — Telephone Encounter (Signed)
 Patient have an appt schedule for her infusion

## 2023-10-21 NOTE — Telephone Encounter (Signed)
 Copied from CRM #[id number]. Topic: Appointments - Scheduling Inquiry for Clinic >> Oct 20, 2023  4:59 PM Delon DASEN wrote: Reason for CRM: [id number] with Dr Paulla office calling to schedule infusion for patient [phone]

## 2023-10-21 NOTE — Telephone Encounter (Signed)
 Try calling patient to schedule appt. Left voice mail

## 2023-10-22 DIAGNOSIS — M797 Fibromyalgia: Secondary | ICD-10-CM | POA: Diagnosis not present

## 2023-10-22 DIAGNOSIS — M6281 Muscle weakness (generalized): Secondary | ICD-10-CM | POA: Diagnosis not present

## 2023-10-22 NOTE — Telephone Encounter (Signed)
 Follow up with Short Stay on Iron Infusion. They inform pt has appt on 10/27/2023 at 10am for iron infusion.   Follow up with pt. Pt states she is aware about the appointment. Pt updates she has a follow up appt with hematology on 11/30/2023 to further discuss about her iron.   Forwarding to Dr. Charlett.

## 2023-10-26 ENCOUNTER — Other Ambulatory Visit: Payer: Self-pay | Admitting: Rheumatology

## 2023-10-26 NOTE — Telephone Encounter (Signed)
 Last Fill: 04/30/2023  Labs: 09/28/2023 RBC 3.28, Hgb 11.2, Hct 33.6, MCV 102.5, RDW 15.8, Platelets 418 08/25/2023 Glucose 132  TB Gold: 09/28/2023 Neg    Next Visit: 02/22/2024  Last Visit: 10/07/2023  DX: Psoriatic arthritis    Current Dose per office note 10/07/2023: Simponi  50 mg every 28 days   Okay to refill Simponi ?

## 2023-10-27 ENCOUNTER — Non-Acute Institutional Stay (HOSPITAL_COMMUNITY)
Admission: RE | Admit: 2023-10-27 | Discharge: 2023-10-27 | Disposition: A | Discharge status: Home / Self Care | Source: Ambulatory Visit | Attending: Internal Medicine | Admitting: Internal Medicine

## 2023-10-27 DIAGNOSIS — D509 Iron deficiency anemia, unspecified: Secondary | ICD-10-CM | POA: Insufficient documentation

## 2023-10-27 MED ORDER — SODIUM CHLORIDE 0.9 % IV SOLN: INTRAVENOUS | Status: DC | PRN

## 2023-10-27 MED ORDER — SODIUM CHLORIDE 0.9 % IV SOLN
510.0000 mg | Freq: Once | INTRAVENOUS | Status: AC
  Administered 2023-10-27: 510 mg via INTRAVENOUS
  Filled 2023-10-27: qty 17

## 2023-10-27 NOTE — Progress Notes (Signed)
 PATIENT CARE CENTER NOTE   Diagnosis: Iron deficiency not corrected with oral iron D50.9   Provider: Charlett Howard, MD   Procedure: Feraheme  infusion    Note: Patient received Feraheme  infusion (dose 1 of 1) via PIV. Patient tolerated infusion well with no adverse reaction. Observed patient for 30 minutes post infusion. BP elevated pre (151/61 and 139/66) and post (152/67) transfusion but patient otherwise stable and reports no additional symptoms.  Discharge instructions given. Patient alert, oriented and ambulatory at discharge.

## 2023-10-28 DIAGNOSIS — M6281 Muscle weakness (generalized): Secondary | ICD-10-CM | POA: Diagnosis not present

## 2023-10-28 DIAGNOSIS — M797 Fibromyalgia: Secondary | ICD-10-CM | POA: Diagnosis not present

## 2023-10-29 ENCOUNTER — Other Ambulatory Visit: Payer: Self-pay | Admitting: Rheumatology

## 2023-11-01 ENCOUNTER — Encounter (HOSPITAL_BASED_OUTPATIENT_CLINIC_OR_DEPARTMENT_OTHER): Payer: Self-pay | Admitting: Obstetrics & Gynecology

## 2023-11-02 NOTE — Telephone Encounter (Signed)
 Sorry you are not feeling well. Appt with me is in 2 weeks  I would advise we get an update Lab , cbc  diff ,  bmp  ,LFTS  iron ibc panel,  CRP for anemia and fatigue DX  ( K can you make her appt and order labs)  There are many moving parts here including GI  arthritis and anemia .Jade Cardenas  Also  it is confusing with PCP team  ordering iron infusions and Dr Onesimo the hematologist expert  specialty  to help /direct / manage  care .  (may be the cause of delay. )

## 2023-11-03 DIAGNOSIS — M6281 Muscle weakness (generalized): Secondary | ICD-10-CM | POA: Diagnosis not present

## 2023-11-03 DIAGNOSIS — M797 Fibromyalgia: Secondary | ICD-10-CM | POA: Diagnosis not present

## 2023-11-04 ENCOUNTER — Other Ambulatory Visit: Payer: Self-pay | Admitting: Internal Medicine

## 2023-11-04 NOTE — Telephone Encounter (Signed)
Dont need to fast

## 2023-11-05 DIAGNOSIS — M6281 Muscle weakness (generalized): Secondary | ICD-10-CM | POA: Diagnosis not present

## 2023-11-05 DIAGNOSIS — M797 Fibromyalgia: Secondary | ICD-10-CM | POA: Diagnosis not present

## 2023-11-08 DIAGNOSIS — M6281 Muscle weakness (generalized): Secondary | ICD-10-CM | POA: Diagnosis not present

## 2023-11-08 DIAGNOSIS — M797 Fibromyalgia: Secondary | ICD-10-CM | POA: Diagnosis not present

## 2023-11-09 DIAGNOSIS — D500 Iron deficiency anemia secondary to blood loss (chronic): Secondary | ICD-10-CM | POA: Diagnosis not present

## 2023-11-09 DIAGNOSIS — K559 Vascular disorder of intestine, unspecified: Secondary | ICD-10-CM | POA: Diagnosis not present

## 2023-11-09 DIAGNOSIS — R197 Diarrhea, unspecified: Secondary | ICD-10-CM | POA: Diagnosis not present

## 2023-11-10 ENCOUNTER — Other Ambulatory Visit (INDEPENDENT_AMBULATORY_CARE_PROVIDER_SITE_OTHER)

## 2023-11-10 DIAGNOSIS — D649 Anemia, unspecified: Secondary | ICD-10-CM

## 2023-11-10 DIAGNOSIS — R1310 Dysphagia, unspecified: Secondary | ICD-10-CM

## 2023-11-10 DIAGNOSIS — R5383 Other fatigue: Secondary | ICD-10-CM

## 2023-11-10 LAB — HEPATIC FUNCTION PANEL
ALT: 13 U/L (ref 0–35)
AST: 32 U/L (ref 0–37)
Albumin: 4.2 g/dL (ref 3.5–5.2)
Alkaline Phosphatase: 50 U/L (ref 39–117)
Bilirubin, Direct: 0.1 mg/dL (ref 0.0–0.3)
Total Bilirubin: 0.5 mg/dL (ref 0.2–1.2)
Total Protein: 6.8 g/dL (ref 6.0–8.3)

## 2023-11-10 LAB — CBC WITH DIFFERENTIAL/PLATELET
Basophils Absolute: 0 K/uL (ref 0.0–0.1)
Basophils Relative: 0.8 % (ref 0.0–3.0)
Eosinophils Absolute: 0.1 K/uL (ref 0.0–0.7)
Eosinophils Relative: 2.2 % (ref 0.0–5.0)
HCT: 30.6 % — ABNORMAL LOW (ref 36.0–46.0)
Hemoglobin: 10.3 g/dL — ABNORMAL LOW (ref 12.0–15.0)
Lymphocytes Relative: 23.6 % (ref 12.0–46.0)
Lymphs Abs: 1.3 K/uL (ref 0.7–4.0)
MCHC: 33.6 g/dL (ref 30.0–36.0)
MCV: 106.5 fl — ABNORMAL HIGH (ref 78.0–100.0)
Monocytes Absolute: 0.4 K/uL (ref 0.1–1.0)
Monocytes Relative: 7.2 % (ref 3.0–12.0)
Neutro Abs: 3.6 K/uL (ref 1.4–7.7)
Neutrophils Relative %: 66.2 % (ref 43.0–77.0)
Platelets: 380 K/uL (ref 150.0–400.0)
RBC: 2.87 Mil/uL — ABNORMAL LOW (ref 3.87–5.11)
RDW: 16.3 % — ABNORMAL HIGH (ref 11.5–15.5)
WBC: 5.5 K/uL (ref 4.0–10.5)

## 2023-11-10 LAB — IRON, TOTAL/TOTAL IRON BINDING CAP
%SAT: 17 % (ref 16–45)
Iron: 60 ug/dL (ref 45–160)
TIBC: 343 ug/dL (ref 250–450)

## 2023-11-10 LAB — C-REACTIVE PROTEIN: CRP: <1 mg/dL (ref 0.5–20.0)

## 2023-11-10 LAB — BASIC METABOLIC PANEL WITH GFR
BUN: 17 mg/dL (ref 6–23)
CO2: 28 meq/L (ref 19–32)
Calcium: 9.7 mg/dL (ref 8.4–10.5)
Chloride: 103 meq/L (ref 96–112)
Creatinine, Ser: 0.68 mg/dL (ref 0.40–1.20)
GFR: 87.6 mL/min (ref >60.00)
Glucose, Bld: 99 mg/dL (ref 70–99)
Potassium: 4.9 meq/L (ref 3.5–5.1)
Sodium: 139 meq/L (ref 135–145)

## 2023-11-11 DIAGNOSIS — M797 Fibromyalgia: Secondary | ICD-10-CM | POA: Diagnosis not present

## 2023-11-11 DIAGNOSIS — M6281 Muscle weakness (generalized): Secondary | ICD-10-CM | POA: Diagnosis not present

## 2023-11-11 LAB — URINALYSIS
Bilirubin, UA: NEGATIVE
Glucose, UA: NEGATIVE
Leukocytes,UA: NEGATIVE
Nitrite, UA: NEGATIVE
Protein,UA: NEGATIVE
RBC, UA: NEGATIVE
Specific Gravity, UA: >=1.03 — AB (ref 1.005–1.030)
Urobilinogen, Ur: 0.2 mg/dL (ref 0.2–1.0)
pH, UA: 5.5 (ref 5.0–7.5)

## 2023-11-11 LAB — CBC WITH DIFFERENTIAL/PLATELET
Basophils Absolute: 0.1 x10E3/uL (ref 0.0–0.2)
Basos: 1 %
EOS (ABSOLUTE): 0.1 x10E3/uL (ref 0.0–0.4)
Eos: 2 %
Hematocrit: 31.8 % — ABNORMAL LOW (ref 34.0–46.6)
Hemoglobin: 10.2 g/dL — ABNORMAL LOW (ref 11.1–15.9)
Immature Grans (Abs): 0 x10E3/uL (ref 0.0–0.1)
Immature Granulocytes: 0 %
Lymphocytes Absolute: 1.5 x10E3/uL (ref 0.7–3.1)
Lymphs: 23 %
MCH: 35.8 pg — ABNORMAL HIGH (ref 26.6–33.0)
MCHC: 32.1 g/dL (ref 31.5–35.7)
MCV: 112 fL — ABNORMAL HIGH (ref 79–97)
Monocytes Absolute: 0.5 x10E3/uL (ref 0.1–0.9)
Monocytes: 8 %
Neutrophils Absolute: 4.1 x10E3/uL (ref 1.4–7.0)
Neutrophils: 65 %
Platelets: 398 x10E3/uL (ref 150–450)
RBC: 2.85 x10E6/uL — ABNORMAL LOW (ref 3.77–5.28)
RDW: 14.5 % (ref 11.7–15.4)
WBC: 6.3 x10E3/uL (ref 3.4–10.8)

## 2023-11-11 LAB — COMPREHENSIVE METABOLIC PANEL WITH GFR
ALT: 13 IU/L (ref 0–32)
AST: 16 IU/L (ref 0–40)
Albumin: 4.2 g/dL (ref 3.8–4.8)
Alkaline Phosphatase: 66 IU/L (ref 44–121)
BUN/Creatinine Ratio: 25 (ref 12–28)
BUN: 17 mg/dL (ref 8–27)
Bilirubin Total: 0.4 mg/dL (ref 0.0–1.2)
CO2: 23 mmol/L (ref 20–29)
Calcium: 9.7 mg/dL (ref 8.7–10.3)
Chloride: 102 mmol/L (ref 96–106)
Creatinine, Ser: 0.68 mg/dL (ref 0.57–1.00)
Globulin, Total: 2.3 g/dL (ref 1.5–4.5)
Glucose: 95 mg/dL (ref 70–99)
Potassium: 4.2 mmol/L (ref 3.5–5.2)
Sodium: 140 mmol/L (ref 134–144)
Total Protein: 6.5 g/dL (ref 6.0–8.5)
eGFR: 93 mL/min/1.73 (ref >59)

## 2023-11-16 DIAGNOSIS — M797 Fibromyalgia: Secondary | ICD-10-CM | POA: Diagnosis not present

## 2023-11-16 DIAGNOSIS — M6281 Muscle weakness (generalized): Secondary | ICD-10-CM | POA: Diagnosis not present

## 2023-11-18 ENCOUNTER — Ambulatory Visit: Admitting: Internal Medicine

## 2023-11-18 VITALS — BP 170/76 | HR 86 | Temp 97.9°F | Ht 65.0 in | Wt 184.2 lb

## 2023-11-18 DIAGNOSIS — I10 Essential (primary) hypertension: Secondary | ICD-10-CM | POA: Diagnosis not present

## 2023-11-18 DIAGNOSIS — Z23 Encounter for immunization: Secondary | ICD-10-CM

## 2023-11-18 DIAGNOSIS — Z79899 Other long term (current) drug therapy: Secondary | ICD-10-CM

## 2023-11-18 DIAGNOSIS — D649 Anemia, unspecified: Secondary | ICD-10-CM | POA: Diagnosis not present

## 2023-11-18 DIAGNOSIS — R718 Other abnormality of red blood cells: Secondary | ICD-10-CM

## 2023-11-18 DIAGNOSIS — Z7901 Long term (current) use of anticoagulants: Secondary | ICD-10-CM | POA: Diagnosis not present

## 2023-11-18 MED ORDER — LOSARTAN POTASSIUM 25 MG PO TABS: 25.0000 mg | ORAL_TABLET | Freq: Every day | ORAL | 3 refills | Status: DC

## 2023-11-18 NOTE — Progress Notes (Signed)
 Chief Complaint  Patient presents with   Medical Management of Chronic Issues    Pt reports she is going for colonoscopy in November. Would like flu, discuss eliquis , is on 2 capsules a day budesonide.     HPI: Jade Cardenas 73 y.o. come in for Chronic disease management  And fu  up and around getting better strength    in PT. Ischemic colitis  hx see notes [facility]  better  S/p hh  diaphramatic repair with complication if bleeding CA 4 25 Iron deficiency Getting iron infusions ( also preop )  to see dr Onesimo end of month Psoriatic arthritis per  rheum on  Simponi  Small fiber neuropathy ,Patel No has and incisional abd hernia  no pain at this time  HT: losartan   12.5 adjusting  bp has been up now only ) HLD lovastatin  DVT in rehab( axillary   listed on problem list but on record review was  distal leg right  distal dvt le may 25  eliquis   dose adjusted . She is to run out and has had 4 mos rx  to have  colon due in November  asks about anticoagulation . No active bleeding .   ROS: See pertinent positives and negatives per HPI. No cp sob   Past Medical History:  Diagnosis Date   Anemia    iron def   Arthritis    rheumatoid, psoriatic, osteoarthritis    Blood clot in vein 07/01/2023   both legs   Bone spur    RIGHT FOOT    Broken toe    Bruises easily    Cellulitis    Chronic back pain    COLONIC POLYPS, HX OF 02/09/2007   Congestive heart failure (HCC) 07/01/2023   Cough, persistent 01/21/2012   poss from acei    DES exposure in utero, unknown    Femur fracture, left (HCC)    Fibromyalgia    doesn't require meds   GASTRIC ULCER, ACUTE, HEMORRHAGE, HX OF 02/09/2007   GERD (gastroesophageal reflux disease)    takes Nexium  daily   Goiter    H/O hiatal hernia    H/O: hysterectomy    when 73 yrs old   Headache    Occasional migraines with increased stress   Hx of seasonal allergies    takes Claritin  daily   HYPERLIPIDEMIA 12/29/2006   takes Lovastatin  nightly    HYPERTENSION 12/29/2006   takes Enalapril  daily   IBS 04/28/2007   Insomnia    related to pain;takes Flexeril  and Tylenol  PM nightly   Ischemic colitis 07/01/2023   Joint pain    Joint swelling    Neuropathy    small cell   Osteopenia    Palpitations 02/09/2007   PONV (postoperative nausea and vomiting)    NAUSEA   Pulmonary edema 07/01/2023   REDUCTION MAMMOPLASTY, HX OF 02/09/2007   Right bundle branch block 02/09/2007   S/P lumbar fusion 6 13 09/03/2011   L4 L5  posterior    SYNCOPE 12/07/2007   Tendonitis    RLE    UNS ADVRS EFF OTH RX MEDICINAL\T\BIOLOGICAL SBSTNC 02/09/2007    Family History  Problem Relation Age of Onset   Diabetes Mother    Colon cancer Mother        x2   Arthritis Mother    Hypertension Mother    Endometrial cancer Mother    Stroke Father    Heart disease Father    Colon cancer Father  Arthritis Brother    Hypertension Brother    Hyperlipidemia Brother    Infertility Brother    Diabetes Brother    Other Other        DISH brother   Hypertension Maternal Grandmother    Stroke Maternal Grandmother    Anesthesia problems Neg Hx    Hypotension Neg Hx    Malignant hyperthermia Neg Hx    Pseudochol deficiency Neg Hx     Social History   Socioeconomic History   Marital status: Widowed    Spouse name: Not on file   Number of children: 0   Years of education: Not on file   Highest education level: Master's degree (e.g., MA, MS, MEng, MEd, MSW, MBA)  Occupational History   Not on file  Tobacco Use   Smoking status: Never    Passive exposure: Never   Smokeless tobacco: Never  Vaping Use   Vaping status: Never Used  Substance and Sexual Activity   Alcohol use: No    Alcohol/week: 0.0 standard drinks of alcohol   Drug use: No   Sexual activity: Not Currently    Partners: Male    Birth control/protection: Surgical    Comment: TAH/BSO  Other Topics Concern   Not on file  Social History Narrative   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  Husband died suddenly 2010Non smoker         Left handed    Lives in a one story home          Social Drivers of Health   Financial Resource Strain: Low Risk  (11/14/2023)   Overall Financial Resource Strain (CARDIA)    Difficulty of Paying Living Expenses: Not very hard  Food Insecurity: No Food Insecurity (11/14/2023)   Hunger Vital Sign    Worried About Running Out of Food in the Last Year: Never true    Ran Out of Food in the Last Year: Never true  Transportation Needs: No Transportation Needs (11/14/2023)   PRAPARE - Administrator, Civil Service (Medical): No    Lack of Transportation (Non-Medical): No  Physical Activity: Insufficiently Active (11/14/2023)   Exercise Vital Sign    Days of Exercise per Week: 7 days    Minutes of Exercise per Session: 20 min  Stress: Stress Concern Present (11/14/2023)   [employer] of Occupational Health - Occupational Stress Questionnaire    Feeling of Stress: To some extent  Social Connections: Moderately Integrated (11/14/2023)   Social Connection and Isolation Panel    Frequency of Communication with Friends and Family: More than three times a week    Frequency of Social Gatherings with Friends and Family: More than three times a week    Attends Religious Services: 1 to 4 times per year    Active Member of [employer] or Organizations: Yes    Attends Banker Meetings: More than 4 times per year    Marital Status: Widowed    Outpatient Medications Prior to Visit  Medication Sig Dispense Refill   acetaminophen  (TYLENOL ) 325 MG tablet Take 2 tablets (650 mg total) by mouth every 6 (six) hours.     apixaban  (ELIQUIS ) 2.5 MG TABS tablet Take 1 tablet (2.5 mg total) by mouth 2 (two) times daily. Take two tablets by mouth on 05/15 pm and 05/16 am. Then decrease to one tablet twice a day.     B-D ALLERGY  SYRINGE 1CC/28G 28G X 1/2 1  ML MISC USE AS  DIRECTED ONCE WEEKLY 12 each 3   budesonide (ENTOCORT EC) 3 MG 24 hr capsule Take 3 mg by mouth. Take 3 capsules (9 mg total) by mouth daily for 30 days, THEN 2 capsules (6 mg total) daily for 30 days, THEN 1 capsule (3 mg total) daily.     Calcium  Carb-Cholecalciferol  (CALCIUM +D3 PO) Take 1 tablet by mouth daily.     carboxymethylcellulose (REFRESH PLUS) 0.5 % SOLN Place 1 drop into both eyes as needed (dry eyes).     diphenhydramine -acetaminophen  (TYLENOL  PM) 25-500 MG TABS tablet Take 1 tablet by mouth at bedtime.     famotidine  (PEPCID ) 40 MG tablet Take 1 tablet (40 mg total) by mouth daily. 30 tablet 0   folic acid  (FOLVITE ) 1 MG tablet Take 1 tablet (1 mg total) by mouth daily. 90 tablet 3   hyoscyamine (ANASPAZ) 0.125 MG TBDP disintergrating tablet Place 0.125 mg under the tongue. Place 1 tablet (0.125 mg total) under the tongue every 4 (four) hours as needed (abdominal pain and cramping).     loperamide  (IMODIUM ) 2 MG capsule Take 1 capsule (2 mg total) by mouth as needed for diarrhea or loose stools. 30 capsule 0   lovastatin  (MEVACOR ) 20 MG tablet TAKE 1 TABLET BY MOUTH EVERY DAY IN THE EVENING 90 tablet 3   methotrexate  50 MG/2ML injection INJECT 0.3ML INTO THE SKIN ONCE WEEKLY 6 mL 0   Multiple Vitamins-Minerals (MULTIVITAMIN ADULTS PO) Take 1 tablet by mouth daily.      pantoprazole  (PROTONIX ) 40 MG tablet Take 40 mg by mouth 2 (two) times daily.     SIMPONI  50 MG/0.5ML SOAJ INJECT 50MG  UNDER THE SKIN ONCE MONTHLY. 0.5 mL 2   losartan  (COZAAR ) 25 MG tablet Take 0.5 tablets (12.5 mg total) by mouth daily. 30 tablet 3   bethanechol  (URECHOLINE ) 10 MG tablet Take 1 tablet (10 mg total) by mouth 3 (three) times daily. (Patient not taking: Reported on 11/18/2023) 90 tablet 0   No facility-administered medications prior to visit.     EXAM:  BP (!) 170/76 (BP Location: Left Arm, Patient Position: Sitting, Cuff Size: Large)   Pulse 86   Temp 97.9 F (36.6 C) (Oral)   Ht 5' 5 (1.651  m)   Wt 184 lb 3.2 oz (83.6 kg)   LMP 03/02/1984   SpO2 98%   BMI 30.65 kg/m   Body mass index is 30.65 kg/m. Wt Readings from Last 3 Encounters:  11/18/23 184 lb 3.2 oz (83.6 kg)  10/07/23 191 lb 6.4 oz (86.8 kg)  09/28/23 190 lb 6.4 oz (86.4 kg)  Repeat BP left arm   GENERAL: vitals reviewed and listed above, alert, oriented, appears well hydrated and in no acute distress independent   has a cane but gait  and energy much improved on exam  HEENT: atraumatic, conjunctiva  clear, no obvious abnormalities on inspection of external nose and ears  NECK: no obvious masses on inspection palpation  LUNGS: clear to auscultation bilaterally, no wheezes, rales or rhonchi, good air movement CV: HRRR, no clubbing cyanosis nl cap refill  MS: moves all extremities without noticeable focal  abnormality PSYCH: pleasant and cooperative, no obvious depression or anxiety Lab Results  Component Value Date   WBC 5.5 11/10/2023   WBC 6.3 11/10/2023   HGB 10.3 (L) 11/10/2023   HGB 10.2 (L) 11/10/2023   HCT 30.6 (L) 11/10/2023   HCT 31.8 (L) 11/10/2023   PLT 380.0 11/10/2023   PLT  398 11/10/2023   GLUCOSE 99 11/10/2023   GLUCOSE 95 11/10/2023   CHOL 181 05/03/2023   TRIG 140 06/30/2023   HDL 70.60 05/03/2023   LDLDIRECT 114.0 04/02/2010   LDLCALC 82 05/03/2023   ALT 13 11/10/2023   ALT 13 11/10/2023   AST 32 11/10/2023   AST 16 11/10/2023   NA 139 11/10/2023   NA 140 11/10/2023   K 4.9 Hemolysis seen.SABRA 11/10/2023   K 4.2 11/10/2023   CL 103 11/10/2023   CL 102 11/10/2023   CREATININE 0.68 11/10/2023   CREATININE 0.68 11/10/2023   BUN 17 11/10/2023   BUN 17 11/10/2023   CO2 28 11/10/2023   CO2 23 11/10/2023   TSH 1.28 05/03/2023   INR 1.1 07/05/2023   HGBA1C 5.8 05/03/2023   BP Readings from Last 3 Encounters:  11/18/23 (!) 170/76  10/27/23 (!) 152/67  10/07/23 138/76    ASSESSMENT AND PLAN:  Discussed the following assessment and plan:  Essential hypertension - higher    130 140 at home  will inc back to 25 mg losartan  per day  Chronic anticoagulation - can dc 4 mos out of  le dvt post surgery  Anemia, unspecified type - elevated  mcv  should consider  b12 other causes.  to have fu dr Onesimo and lab in about 2 weeks will send info to his team . may be iron defic and b defic?  Elevated MCV  Medication management  Flu vaccine need - Plan: Flu vaccine HIGH DOSE PF(Fluzone Trivalent) Eliquis   may be able tot stop since 4 mos out and provoked and doing well  with activity  Although no bleeding .  Iron deficiency  hopefully will improve some  since large hernia repaired abeit had life threatening   complications and colitis....  Ok to stop the  anticoagulation since  dvt was provoked after surgery and in rehab and no recurrence .   ( Med problem list was incorrect  and no axillary dvt  )  And to have the colon.  In net few  months  Fu shows persistent elevated mcv x 2  consider b12 deficiency since surgery and colitis. -Patient advised to return or notify health care team  if  new concerns arise. 36 minutes  record review visit counsel plan and coordination  Patient Instructions  Should be ok to go off eliquis     since out 4 months  from evoked event.  Will send info to Dr Onesimo  about the cbc and  mcv  consider b12 other  checks in FU  Flu shot  today .  Increase losartan  to 25 mg per day .  For bp control .  Continue   active pt etc.   Fu 4-6 months or as indicated    Leontae Bostock K. Kayline Sheer M.D.

## 2023-11-18 NOTE — Patient Instructions (Addendum)
 Should be ok to go off eliquis     since out 4 months  from evoked event.  Will send info to Dr Onesimo  about the cbc and  mcv  consider b12 other  checks in FU  Flu shot  today .  Increase losartan  to 25 mg per day .  For bp control .  Continue   active pt etc.   Fu 4-6 months or as indicated

## 2023-11-19 DIAGNOSIS — M6281 Muscle weakness (generalized): Secondary | ICD-10-CM | POA: Diagnosis not present

## 2023-11-19 DIAGNOSIS — M797 Fibromyalgia: Secondary | ICD-10-CM | POA: Diagnosis not present

## 2023-11-24 DIAGNOSIS — M797 Fibromyalgia: Secondary | ICD-10-CM | POA: Diagnosis not present

## 2023-11-24 DIAGNOSIS — M6281 Muscle weakness (generalized): Secondary | ICD-10-CM | POA: Diagnosis not present

## 2023-11-26 DIAGNOSIS — M797 Fibromyalgia: Secondary | ICD-10-CM | POA: Diagnosis not present

## 2023-11-26 DIAGNOSIS — M6281 Muscle weakness (generalized): Secondary | ICD-10-CM | POA: Diagnosis not present

## 2023-11-29 ENCOUNTER — Other Ambulatory Visit: Payer: Self-pay

## 2023-11-29 DIAGNOSIS — M6281 Muscle weakness (generalized): Secondary | ICD-10-CM | POA: Diagnosis not present

## 2023-11-29 DIAGNOSIS — E611 Iron deficiency: Secondary | ICD-10-CM

## 2023-11-29 DIAGNOSIS — M797 Fibromyalgia: Secondary | ICD-10-CM | POA: Diagnosis not present

## 2023-11-30 ENCOUNTER — Inpatient Hospital Stay: Attending: Hematology

## 2023-11-30 ENCOUNTER — Inpatient Hospital Stay (HOSPITAL_BASED_OUTPATIENT_CLINIC_OR_DEPARTMENT_OTHER): Admitting: Hematology

## 2023-11-30 DIAGNOSIS — D509 Iron deficiency anemia, unspecified: Secondary | ICD-10-CM | POA: Insufficient documentation

## 2023-11-30 DIAGNOSIS — E611 Iron deficiency: Secondary | ICD-10-CM

## 2023-11-30 LAB — CMP (CANCER CENTER ONLY)
ALT: 12 U/L (ref 0–44)
AST: 15 U/L (ref 15–41)
Albumin: 4.6 g/dL (ref 3.5–5.0)
Alkaline Phosphatase: 72 U/L (ref 38–126)
Anion gap: 7 (ref 5–15)
BUN: 17 mg/dL (ref 8–23)
CO2: 30 mmol/L (ref 22–32)
Calcium: 9.6 mg/dL (ref 8.9–10.3)
Chloride: 104 mmol/L (ref 98–111)
Creatinine: 0.76 mg/dL (ref 0.44–1.00)
GFR, Estimated: >60 mL/min (ref >60)
Glucose, Bld: 130 mg/dL — ABNORMAL HIGH (ref 70–99)
Potassium: 3.7 mmol/L (ref 3.5–5.1)
Sodium: 141 mmol/L (ref 135–145)
Total Bilirubin: 0.5 mg/dL (ref 0.0–1.2)
Total Protein: 7.6 g/dL (ref 6.5–8.1)

## 2023-11-30 LAB — CBC WITH DIFFERENTIAL (CANCER CENTER ONLY)
Abs Immature Granulocytes: 0.04 K/uL (ref 0.00–0.07)
Basophils Absolute: 0.1 K/uL (ref 0.0–0.1)
Basophils Relative: 1 %
Eosinophils Absolute: 0.1 K/uL (ref 0.0–0.5)
Eosinophils Relative: 1 %
HCT: 32.1 % — ABNORMAL LOW (ref 36.0–46.0)
Hemoglobin: 10.8 g/dL — ABNORMAL LOW (ref 12.0–15.0)
Immature Granulocytes: 1 %
Lymphocytes Relative: 26 %
Lymphs Abs: 1.6 K/uL (ref 0.7–4.0)
MCH: 34.4 pg — ABNORMAL HIGH (ref 26.0–34.0)
MCHC: 33.6 g/dL (ref 30.0–36.0)
MCV: 102.2 fL — ABNORMAL HIGH (ref 80.0–100.0)
Monocytes Absolute: 0.4 K/uL (ref 0.1–1.0)
Monocytes Relative: 7 %
Neutro Abs: 4 K/uL (ref 1.7–7.7)
Neutrophils Relative %: 64 %
Platelet Count: 428 K/uL — ABNORMAL HIGH (ref 150–400)
RBC: 3.14 MIL/uL — ABNORMAL LOW (ref 3.87–5.11)
RDW: 13.4 % (ref 11.5–15.5)
WBC Count: 6.2 K/uL (ref 4.0–10.5)
nRBC: 0 % (ref 0.0–0.2)

## 2023-11-30 LAB — FERRITIN: Ferritin: 90 ng/mL (ref 11–307)

## 2023-11-30 LAB — IRON AND IRON BINDING CAPACITY (CC-WL,HP ONLY)
Iron: 44 ug/dL (ref 28–170)
Saturation Ratios: 10 % — ABNORMAL LOW (ref 10.4–31.8)
TIBC: 426 ug/dL (ref 250–450)
UIBC: 382 ug/dL (ref 148–442)

## 2023-11-30 NOTE — Progress Notes (Signed)
 HEMATOLOGY ONCOLOGY PROGRESS NOTE  Date of service: 11/30/2023  Patient Care Team: Panosh, Jade POUR, MD as PCP - General Jade Ronal RAMAN, MD (Gynecology) Jade Reiter, MD as Consulting Physician (Rheumatology) Jade Emaline Brink, MD as Consulting Physician (Hematology) Jade Lerner, MD as Consulting Physician (Orthopedic Surgery) Jade Tonita POUR, DO as Consulting Physician (Neurology) Jade Cardenas (Pharmacist)  CHIEF COMPLAINT/PURPOSE OF CONSULTATION: Follow-up for iron deficiency anemia and anemia of chronic inflammation  HISTORY OF PRESENTING ILLNESS: Jade Cardenas See Previous note for details on initial presentation   SUMMARY OF ONCOLOGIC HISTORY:   INTERVAL HISTORY:  Jade Cardenas is a 73 y.o. female who is here for continued evaluation and management of anemia related to iron deficiency and chronic inflammation.  Patient had a complicated time with a hiatal hernia surgery in April 2025 where she reports that her splenic artery was nicked and she was having major bleeding and also underwent a cardiac arrest and had to have CPR 2 times. She reports needing more than 5 units of blood and was in the hospital for 17 days. She also reports having developed ischemic colitis.  She notes no overt GI bleeding at this time. Has been taking her B complex and B12. Does follow with her primary care physician Dr. Apolinar Cardenas.  REVIEW OF SYSTEMS:    .10 Point review of Systems was done is negative except as noted above.  MEDICAL HISTORY Past Medical History:  Diagnosis Date   Anemia    iron def   Arthritis    rheumatoid, psoriatic, osteoarthritis    Blood clot in vein 07/01/2023   both legs   Bone spur    RIGHT FOOT    Broken toe    Bruises easily    Cellulitis    Chronic back pain    COLONIC POLYPS, HX OF 02/09/2007   Congestive heart failure (HCC) 07/01/2023   Cough, persistent 01/21/2012   poss from acei    DES exposure in utero, unknown    Femur  fracture, left (HCC)    Fibromyalgia    doesn't require meds   GASTRIC ULCER, ACUTE, HEMORRHAGE, HX OF 02/09/2007   GERD (gastroesophageal reflux disease)    takes Nexium  daily   Goiter    H/O hiatal hernia    H/O: hysterectomy    when 73 yrs old   Headache    Occasional migraines with increased stress   Hx of seasonal allergies    takes Claritin  daily   HYPERLIPIDEMIA 12/29/2006   takes Lovastatin  nightly   HYPERTENSION 12/29/2006   takes Enalapril  daily   IBS 04/28/2007   Insomnia    related to pain;takes Flexeril  and Tylenol  PM nightly   Ischemic colitis 07/01/2023   Joint pain    Joint swelling    Neuropathy    small cell   Osteopenia    Palpitations 02/09/2007   PONV (postoperative nausea and vomiting)    NAUSEA   Pulmonary edema 07/01/2023   REDUCTION MAMMOPLASTY, HX OF 02/09/2007   Right bundle branch block 02/09/2007   S/P lumbar fusion 6 13 09/03/2011   L4 L5  posterior    SYNCOPE 12/07/2007   Tendonitis    RLE    UNS ADVRS EFF OTH RX MEDICINAL\T\BIOLOGICAL SBSTNC 02/09/2007    SURGICAL HISTORY Past Surgical History:  Procedure Laterality Date   BACK SURGERY  08/2011   lumbar L4-L5 fusion, lamenectomy   bone spur  6-55yrs ago   right ankle   BUNIONECTOMY  10/03/2014  right foot   BUNIONECTOMY Left    CERVICAL FUSION     2 12    CHOLECYSTECTOMY  1996   HARDWARE REMOVAL Left 08/01/2018   Procedure: Left knee hardware removal;  Surgeon: Jade Lerner, MD;  Location: [facility];  Service: Orthopedics;  Laterality: Left;    HIATAL HERNIA REPAIR  06/29/2023   LAPAROTOMY N/A 06/29/2023   Procedure: LAPAROTOMY, EXPLORATORY W/LIGATION SPLENIC ARTERY AND GASTRIC TUBE PLACEMENT;  Surgeon: Rubin Calamity, MD;  Location: [facility];  Service: General;  Laterality: N/A;   POSTERIOR CERVICAL LAMINECTOMY Left 07/30/2015   Procedure: Laminectomy and Foraminotomy - left - Cervical two -Cervical three;  Surgeon: Victory Gunnels, MD;  Location: [facility];  Service:  Neurosurgery;  Laterality: Left;   REDUCTION MAMMAPLASTY Bilateral 1989   TONSILLECTOMY     as a child   TOTAL ABDOMINAL HYSTERECTOMY W/ BILATERAL SALPINGOOPHORECTOMY  1986   TOTAL KNEE ARTHROPLASTY N/A 06/21/2017   Procedure: LEFT TOTAL KNEE ARTHROPLASTY AND RIGHT KNEE CORTISONE INJECTION;  Surgeon: Jade Lerner, MD;  Location: [facility];  Service: Orthopedics;  Laterality: N/A;   XI ROBOTIC ASSISTED HIATAL HERNIA REPAIR N/A 06/29/2023   Procedure: ATTEMPTED XI ROBOTIC ASSISTED HIATAL HERNIA REPAIR WITH FUNDOPLICATION AND MESH;  Surgeon: Rubin Calamity, MD;  Location: [facility];  Service: General;  Laterality: N/A;  NO MESH INSERTED    SOCIAL HISTORY Social History   Tobacco Use   Smoking status: Never    Passive exposure: Never   Smokeless tobacco: Never  Vaping Use   Vaping status: Never Used  Substance Use Topics   Alcohol use: No    Alcohol/week: 0.0 standard drinks of alcohol   Drug use: No    Social History   Social History Narrative   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 on retirement trackWidow  Husband died suddenly 2010Non smoker         Left handed    Lives in a one story home           SOCIAL DRIVERS OF HEALTH SDOH Screenings   Food Insecurity: No Food Insecurity (11/14/2023)  Housing: Low Risk  (11/14/2023)  Transportation Needs: No Transportation Needs (11/14/2023)  Utilities: Low Risk  (09/23/2023)   Received from [facility]  Alcohol Screen: Low Risk  (05/08/2022)  Depression (PHQ2-9): Low Risk  (08/12/2023)  Financial Resource Strain: Low Risk  (11/14/2023)  Physical Activity: Insufficiently Active (11/14/2023)  Social Connections: Moderately Integrated (11/14/2023)  Stress: Stress Concern Present (11/14/2023)  Tobacco Use: Low Risk  (11/09/2023)   Received from [facility]   FAMILY HISTORY Family History  Problem Relation Age of Onset   Diabetes Mother    Colon cancer Mother        x2   Arthritis Mother     Hypertension Mother    Endometrial cancer Mother    Stroke Father    Heart disease Father    Colon cancer Father    Arthritis Brother    Hypertension Brother    Hyperlipidemia Brother    Infertility Brother    Diabetes Brother    Other Other        DISH brother   Hypertension Maternal Grandmother    Stroke Maternal Grandmother    Anesthesia problems Neg Hx    Hypotension Neg Hx    Malignant hyperthermia Neg Hx    Pseudochol deficiency Neg Hx     ALLERGIES: is allergic to erythromycin, iodinated contrast media, ace inhibitors, indomethacin, keflex [  cephalexin], and macrobid  [nitrofurantoin ].  MEDICATIONS  Current Outpatient Medications  Medication Sig Dispense Refill   acetaminophen  (TYLENOL ) 325 MG tablet Take 2 tablets (650 mg total) by mouth every 6 (six) hours.     budesonide (ENTOCORT EC) 3 MG 24 hr capsule Take 6 mg by mouth daily. Take 6 mg for 30 days total then take 3 mg total daily     Calcium  Carb-Cholecalciferol  (CALCIUM +D3 PO) Take 1 tablet by mouth daily.     carboxymethylcellulose (REFRESH PLUS) 0.5 % SOLN Place 1 drop into both eyes as needed (dry eyes).     diphenhydramine -acetaminophen  (TYLENOL  PM) 25-500 MG TABS tablet Take 1 tablet by mouth at bedtime.     famotidine  (PEPCID ) 40 MG tablet Take 1 tablet (40 mg total) by mouth daily. 30 tablet 0   folic acid  (FOLVITE ) 1 MG tablet Take 1 tablet (1 mg total) by mouth daily. 90 tablet 3   hyoscyamine (LEVBID) 0.375 MG 12 hr tablet Take 0.375 mg by mouth 2 (two) times daily.     loperamide  (IMODIUM ) 2 MG capsule Take 1 capsule (2 mg total) by mouth as needed for diarrhea or loose stools. 30 capsule 0   losartan  (COZAAR ) 25 MG tablet Take 1 tablet (25 mg total) by mouth daily. Dosage change 90 tablet 3   lovastatin  (MEVACOR ) 20 MG tablet TAKE 1 TABLET BY MOUTH EVERY DAY IN THE EVENING 90 tablet 3   methotrexate  50 MG/2ML injection INJECT 0.3ML INTO THE SKIN ONCE WEEKLY 6 mL 0   Multiple Vitamins-Minerals (MULTIVITAMIN  ADULTS PO) Take 1 tablet by mouth daily.      pantoprazole  (PROTONIX ) 40 MG tablet Take 40 mg by mouth 2 (two) times daily.     SIMPONI  50 MG/0.5ML SOAJ INJECT 50MG  UNDER THE SKIN ONCE MONTHLY. 0.5 mL 2   B-D ALLERGY  SYRINGE 1CC/28G 28G X 1/2 1 ML MISC USE AS DIRECTED ONCE WEEKLY 12 each 3   hyoscyamine (ANASPAZ) 0.125 MG TBDP disintergrating tablet Place 0.125 mg under the tongue. Place 1 tablet (0.125 mg total) under the tongue every 4 (four) hours as needed (abdominal pain and cramping). (Patient not taking: Reported on 11/30/2023)     No current facility-administered medications for this visit.    PHYSICAL EXAMINATION: ECOG PERFORMANCE STATUS: 1 - Symptomatic but completely ambulatory VSS GENERAL:alert, in no acute distress and comfortable SKIN: no acute rashes, no significant lesions EYES: conjunctiva are pink and non-injected, sclera anicteric OROPHARYNX: MMM, no exudates, no oropharyngeal erythema or ulceration NECK: supple, no JVD LYMPH:  no palpable lymphadenopathy in the cervical, axillary or inguinal regions LUNGS: clear to auscultation b/l with normal respiratory effort HEART: regular rate & rhythm ABDOMEN:  normoactive bowel sounds , non tender, not distended.healed surgical incision. Extremity: no pedal edema PSYCH: alert & oriented x 3 with fluent speech NEURO: no focal motor/sensory deficits   LABORATORY DATA:   I have reviewed the data as listed     Latest Ref Rng & Units 11/30/2023   12:47 PM 11/10/2023    8:50 AM 09/28/2023    3:16 PM  CBC EXTENDED  WBC 4.0 - 10.5 K/uL 6.2  5.5    6.3  7.3   RBC 3.87 - 5.11 MIL/uL 3.14  2.87    2.85  3.28   Hemoglobin 12.0 - 15.0 g/dL 89.1  89.6    89.7  88.7   HCT 36.0 - 46.0 % 32.1  30.6    31.8  33.6   Platelets 150 - 400 K/uL  428  380.0    398  418.0   NEUT# 1.7 - 7.7 K/uL 4.0  3.6    4.1  4.7   Lymph# 0.7 - 4.0 K/uL 1.6  1.3    1.5  1.8    .CBC    Component Value Date/Time   WBC 6.2 11/30/2023 1247   WBC 5.5  11/10/2023 0850   RBC 3.14 (L) 11/30/2023 1247   HGB 10.8 (L) 11/30/2023 1247   HGB 10.2 (L) 11/10/2023 0850   HGB 13.2 10/28/2016 1029   HCT 32.1 (L) 11/30/2023 1247   HCT 31.8 (L) 11/10/2023 0850   HCT 37.5 10/28/2016 1029   PLT 428 (H) 11/30/2023 1247   PLT 398 11/10/2023 0850   MCV 102.2 (H) 11/30/2023 1247   MCV 112 (H) 11/10/2023 0850   MCV 99.5 10/28/2016 1029   MCH 34.4 (H) 11/30/2023 1247   MCHC 33.6 11/30/2023 1247   RDW 13.4 11/30/2023 1247   RDW 14.5 11/10/2023 0850   RDW 12.7 10/28/2016 1029   LYMPHSABS 1.6 11/30/2023 1247   LYMPHSABS 1.5 11/10/2023 0850   LYMPHSABS 1.9 10/28/2016 1029   MONOABS 0.4 11/30/2023 1247   MONOABS 0.3 10/28/2016 1029   EOSABS 0.1 11/30/2023 1247   EOSABS 0.1 11/10/2023 0850   BASOSABS 0.1 11/30/2023 1247   BASOSABS 0.1 11/10/2023 0850   BASOSABS 0.0 10/28/2016 1029          Latest Ref Rng & Units 11/30/2023   12:47 PM 11/10/2023    8:50 AM 07/28/2023    2:59 PM  CMP  Glucose 70 - 99 mg/dL 869  99    95  878   BUN 8 - 23 mg/dL 17  17    17  15    Creatinine 0.44 - 1.00 mg/dL 9.23  9.31    9.31  9.31   Sodium 135 - 145 mmol/L 141  139    140  138   Potassium 3.5 - 5.1 mmol/L 3.7  4.9 Hemolysis seen..    4.2  4.3   Chloride 98 - 111 mmol/L 104  103    102  100   CO2 22 - 32 mmol/L 30  28    23  21    Calcium  8.9 - 10.3 mg/dL 9.6  9.7    9.7  9.7   Total Protein 6.5 - 8.1 g/dL 7.6  6.8    6.5  6.9   Total Bilirubin 0.0 - 1.2 mg/dL 0.5  0.5    0.4  0.5   Alkaline Phos 38 - 126 U/L 72  50    66  97   AST 15 - 41 U/L 15  32    16  11   ALT 0 - 44 U/L 12  13    13  11     . Lab Results  Component Value Date   RETICCTPCT 2.27 (H) 10/28/2016   RBC 3.14 (L) 11/30/2023   RETICCTABS 85.58 10/28/2016   . Lab Results  Component Value Date   IRON 44 11/30/2023   TIBC 426 11/30/2023   IRONPCTSAT 10 (L) 11/30/2023   (Iron and TIBC)  Lab Results  Component Value Date   FERRITIN 90 11/30/2023     RADIOGRAPHIC  STUDIES: I have personally reviewed the radiological images as listed and agreed with the findings in the report. No results found.  ASSESSMENT & PLAN:   73 y.o. female with   Ms. Debroux is a pleasant 73 year old female with   #1 hx  of Severe microcytic hypochromic anemia  Due to iron deficiency.  #2 severe iron deficiency likely due to chronic GI bleeding + poor oral iron absorption. She has a EGD and colonoscopy in 2013.  Previous history of gastric ulcer requiring blood transfusions.  Also send gastric polyps and a large hernia and prone to developing Cameron ulcers, previous colonic polyps and previous bleeding hemorrhoids.   Patient reports having recent EGD and colonoscopy with Dr Luis in April 2017 which showed a large gastric ulcer. She reports that it was noted to be benign and Helicobacter pylori negative. The reports of the studies and the pathology are not available to us  at this time. Patient notes that she has had f/u EGD which showed that the GU has healed - EGD read not available to us  currently.   #3 history of poor tolerance to oral iron with significant GI distress.  She is also on chronic PPI therapy that puts her at higher risk of developing iron deficiency due to poor absorption.  And also B12 deficiency and possible vitamin D  deficiency.   #4 Recent macrocytic anemia in the context of acute blood loss from aborted hiatal hernia surgery with bleeding from neck splenic artery. Macrocytosis could be likely related to her reticulocytosis from GI losses. CBC shows normal bilirubin with no evidence of overt hemolysis. PLAN: - Labs done today were discussed in detail with the patient -CBC shows hemoglobin of 10.8 with an MCV of 102 WBC count of 6.2k and platelets of 428k - CMP stable - Ferritin is 90 with an iron saturation of 17%. Ferritin has been overestimated in the context of chronic inflammation related to psoriasis and her recent abdominal surgery with  complications. - Will set her up for IV Feraheme  510 mg weekly x 2 doses at [facility] based on patient's preference. -- Continue her B complex and B12. --Return to clinic with Dr. Onesimo with labs in 3 months  Follow-up IV Feraheme  510 mg weekly x 2 doses at [facility] With Dr. Onesimo with labs in 3 months  .The total time spent in the appointment was 30 minutes* .  All of the patient's questions were answered with apparent satisfaction. The patient knows to call the clinic with any problems, questions or concerns.   Emaline Onesimo MD MS AAHIVMS [facility] Hematology/Oncology Physician [facility]  .*Total Encounter Time as defined by the Centers for Medicare and Medicaid Services includes, in addition to the face-to-face time of a patient visit (documented in the note above) non-face-to-face time: obtaining and reviewing outside history, ordering and reviewing medications, tests or procedures, care coordination (communications with other health care professionals or caregivers) and documentation in the medical record.  I,Jade Cardenas,acting as a Neurosurgeon for Emaline Onesimo, MD.,have documented all relevant documentation on the behalf of Emaline Onesimo, MD,as directed by  Emaline Onesimo, MD while in the presence of Emaline Onesimo, MD.  I have reviewed the above documentation for accuracy and completeness, and I agree with the above.  Jade Atkins, MD

## 2023-12-01 ENCOUNTER — Other Ambulatory Visit: Payer: Self-pay | Admitting: Obstetrics & Gynecology

## 2023-12-01 DIAGNOSIS — M797 Fibromyalgia: Secondary | ICD-10-CM | POA: Diagnosis not present

## 2023-12-01 DIAGNOSIS — Z1231 Encounter for screening mammogram for malignant neoplasm of breast: Secondary | ICD-10-CM

## 2023-12-01 DIAGNOSIS — M6281 Muscle weakness (generalized): Secondary | ICD-10-CM | POA: Diagnosis not present

## 2023-12-06 ENCOUNTER — Encounter: Payer: Self-pay | Admitting: Hematology

## 2023-12-06 DIAGNOSIS — M6281 Muscle weakness (generalized): Secondary | ICD-10-CM | POA: Diagnosis not present

## 2023-12-06 DIAGNOSIS — M797 Fibromyalgia: Secondary | ICD-10-CM | POA: Diagnosis not present

## 2023-12-07 ENCOUNTER — Encounter: Payer: Self-pay | Admitting: Internal Medicine

## 2023-12-08 ENCOUNTER — Encounter: Payer: Self-pay | Admitting: Hematology

## 2023-12-08 DIAGNOSIS — M6281 Muscle weakness (generalized): Secondary | ICD-10-CM | POA: Diagnosis not present

## 2023-12-08 DIAGNOSIS — M797 Fibromyalgia: Secondary | ICD-10-CM | POA: Diagnosis not present

## 2023-12-10 ENCOUNTER — Ambulatory Visit
Admission: RE | Admit: 2023-12-10 | Discharge: 2023-12-10 | Disposition: A | Discharge status: Home / Self Care | Source: Ambulatory Visit | Attending: Obstetrics & Gynecology | Admitting: Obstetrics & Gynecology

## 2023-12-10 DIAGNOSIS — Z1231 Encounter for screening mammogram for malignant neoplasm of breast: Secondary | ICD-10-CM

## 2023-12-13 DIAGNOSIS — M6281 Muscle weakness (generalized): Secondary | ICD-10-CM | POA: Diagnosis not present

## 2023-12-13 DIAGNOSIS — M797 Fibromyalgia: Secondary | ICD-10-CM | POA: Diagnosis not present

## 2023-12-15 DIAGNOSIS — M797 Fibromyalgia: Secondary | ICD-10-CM | POA: Diagnosis not present

## 2023-12-15 DIAGNOSIS — M6281 Muscle weakness (generalized): Secondary | ICD-10-CM | POA: Diagnosis not present

## 2023-12-17 ENCOUNTER — Other Ambulatory Visit: Payer: Self-pay | Admitting: Hematology

## 2023-12-17 ENCOUNTER — Inpatient Hospital Stay: Attending: Hematology

## 2023-12-17 VITALS — BP 139/63 | HR 73 | Temp 97.6°F | Resp 18 | Ht 65.0 in | Wt 189.2 lb

## 2023-12-17 DIAGNOSIS — D509 Iron deficiency anemia, unspecified: Secondary | ICD-10-CM | POA: Insufficient documentation

## 2023-12-17 DIAGNOSIS — E611 Iron deficiency: Secondary | ICD-10-CM

## 2023-12-17 MED ORDER — FAMOTIDINE IN NACL 20-0.9 MG/50ML-% IV SOLN: 20.0000 mg | Freq: Once | INTRAVENOUS | Status: DC | PRN

## 2023-12-17 MED ORDER — HEPARIN SOD (PORK) LOCK FLUSH 100 UNIT/ML IV SOLN: 250.0000 [IU] | Freq: Once | INTRAVENOUS | Status: DC | PRN

## 2023-12-17 MED ORDER — SODIUM CHLORIDE 0.9 % IV SOLN: Freq: Once | INTRAVENOUS | Status: AC

## 2023-12-17 MED ORDER — SODIUM CHLORIDE 0.9% FLUSH: 3.0000 mL | Freq: Once | INTRAVENOUS | Status: DC | PRN

## 2023-12-17 MED ORDER — SODIUM CHLORIDE 0.9 % IV SOLN
510.0000 mg | Freq: Once | INTRAVENOUS | Status: AC
  Administered 2023-12-17: 510 mg via INTRAVENOUS
  Filled 2023-12-17: qty 510

## 2023-12-17 MED ORDER — ACETAMINOPHEN 325 MG PO TABS: 650.0000 mg | ORAL_TABLET | Freq: Once | ORAL | Status: DC

## 2023-12-17 MED ORDER — METHYLPREDNISOLONE SODIUM SUCC 125 MG IJ SOLR: 125.0000 mg | Freq: Once | INTRAMUSCULAR | Status: DC | PRN

## 2023-12-17 MED ORDER — EPINEPHRINE 0.3 MG/0.3ML IJ SOAJ: 0.3000 mg | Freq: Once | INTRAMUSCULAR | Status: DC | PRN

## 2023-12-17 MED ORDER — HEPARIN SOD (PORK) LOCK FLUSH 100 UNIT/ML IV SOLN: 500.0000 [IU] | Freq: Once | INTRAVENOUS | Status: DC | PRN

## 2023-12-17 MED ORDER — SODIUM CHLORIDE 0.9 % IV SOLN: Freq: Once | INTRAVENOUS | Status: DC | PRN

## 2023-12-17 MED ORDER — ALBUTEROL SULFATE (2.5 MG/3ML) 0.083% IN NEBU: 2.5000 mg | INHALATION_SOLUTION | Freq: Once | RESPIRATORY_TRACT | Status: DC | PRN

## 2023-12-17 MED ORDER — ALTEPLASE 2 MG IJ SOLR: 2.0000 mg | Freq: Once | INTRAMUSCULAR | Status: DC | PRN

## 2023-12-17 MED ORDER — LORATADINE 10 MG PO TABS: 10.0000 mg | ORAL_TABLET | Freq: Every day | ORAL | Status: DC

## 2023-12-17 MED ORDER — DIPHENHYDRAMINE HCL 50 MG/ML IJ SOLN: 50.0000 mg | Freq: Once | INTRAMUSCULAR | Status: DC | PRN

## 2023-12-17 MED ORDER — SODIUM CHLORIDE 0.9% FLUSH: 10.0000 mL | Freq: Once | INTRAVENOUS | Status: DC | PRN

## 2023-12-17 NOTE — Patient Instructions (Signed)

## 2023-12-21 DIAGNOSIS — M797 Fibromyalgia: Secondary | ICD-10-CM | POA: Diagnosis not present

## 2023-12-21 DIAGNOSIS — M6281 Muscle weakness (generalized): Secondary | ICD-10-CM | POA: Diagnosis not present

## 2023-12-24 ENCOUNTER — Ambulatory Visit

## 2023-12-24 VITALS — BP 126/46 | HR 78 | Temp 97.6°F | Resp 18

## 2023-12-24 DIAGNOSIS — D509 Iron deficiency anemia, unspecified: Secondary | ICD-10-CM | POA: Diagnosis not present

## 2023-12-24 DIAGNOSIS — E611 Iron deficiency: Secondary | ICD-10-CM

## 2023-12-24 MED ORDER — SODIUM CHLORIDE 0.9 % IV SOLN
510.0000 mg | Freq: Once | INTRAVENOUS | Status: AC
  Administered 2023-12-24: 510 mg via INTRAVENOUS
  Filled 2023-12-24: qty 510

## 2023-12-24 NOTE — Patient Instructions (Signed)

## 2023-12-24 NOTE — Progress Notes (Signed)
 Patient took pre meds for feraheme  at 1300 prior to appointment.

## 2023-12-29 DIAGNOSIS — M797 Fibromyalgia: Secondary | ICD-10-CM | POA: Diagnosis not present

## 2023-12-29 DIAGNOSIS — M6281 Muscle weakness (generalized): Secondary | ICD-10-CM | POA: Diagnosis not present

## 2023-12-31 DIAGNOSIS — M797 Fibromyalgia: Secondary | ICD-10-CM | POA: Diagnosis not present

## 2023-12-31 DIAGNOSIS — M6281 Muscle weakness (generalized): Secondary | ICD-10-CM | POA: Diagnosis not present

## 2024-01-01 DIAGNOSIS — Z23 Encounter for immunization: Secondary | ICD-10-CM | POA: Diagnosis not present

## 2024-01-03 DIAGNOSIS — M797 Fibromyalgia: Secondary | ICD-10-CM | POA: Diagnosis not present

## 2024-01-03 DIAGNOSIS — M6281 Muscle weakness (generalized): Secondary | ICD-10-CM | POA: Diagnosis not present

## 2024-01-05 DIAGNOSIS — M797 Fibromyalgia: Secondary | ICD-10-CM | POA: Diagnosis not present

## 2024-01-05 DIAGNOSIS — M6281 Muscle weakness (generalized): Secondary | ICD-10-CM | POA: Diagnosis not present

## 2024-01-06 ENCOUNTER — Other Ambulatory Visit: Payer: Self-pay | Admitting: Physician Assistant

## 2024-01-06 NOTE — Telephone Encounter (Signed)
 Last Fill: 10/07/2023  Labs: 11/30/2023 Glucose 130 RBC 3.14 Hemoglobin 10.8 HCT 32.1 MCV 102.2 MCH 34.4 Platelet Count 428  Next Visit: 02/22/2024  Last Visit: 10/07/2023  DX: Psoriatic arthritis (HCC)   Current Dose per office note 10/07/2023: methotrexate  0.3 ml every 7 days   Okay to refill Methotrexate ?

## 2024-01-07 DIAGNOSIS — Z8711 Personal history of peptic ulcer disease: Secondary | ICD-10-CM | POA: Diagnosis not present

## 2024-01-07 DIAGNOSIS — Z7952 Long term (current) use of systemic steroids: Secondary | ICD-10-CM | POA: Diagnosis not present

## 2024-01-07 DIAGNOSIS — D123 Benign neoplasm of transverse colon: Secondary | ICD-10-CM | POA: Diagnosis not present

## 2024-01-07 DIAGNOSIS — Z7901 Long term (current) use of anticoagulants: Secondary | ICD-10-CM | POA: Diagnosis not present

## 2024-01-07 DIAGNOSIS — K449 Diaphragmatic hernia without obstruction or gangrene: Secondary | ICD-10-CM | POA: Diagnosis not present

## 2024-01-07 DIAGNOSIS — D500 Iron deficiency anemia secondary to blood loss (chronic): Secondary | ICD-10-CM | POA: Diagnosis not present

## 2024-01-07 DIAGNOSIS — Z860101 Personal history of adenomatous and serrated colon polyps: Secondary | ICD-10-CM | POA: Diagnosis not present

## 2024-01-07 DIAGNOSIS — K559 Vascular disorder of intestine, unspecified: Secondary | ICD-10-CM | POA: Diagnosis not present

## 2024-01-07 DIAGNOSIS — K5730 Diverticulosis of large intestine without perforation or abscess without bleeding: Secondary | ICD-10-CM | POA: Diagnosis not present

## 2024-01-07 DIAGNOSIS — L4050 Arthropathic psoriasis, unspecified: Secondary | ICD-10-CM | POA: Diagnosis not present

## 2024-01-07 DIAGNOSIS — K635 Polyp of colon: Secondary | ICD-10-CM | POA: Diagnosis not present

## 2024-01-07 DIAGNOSIS — Z79899 Other long term (current) drug therapy: Secondary | ICD-10-CM | POA: Diagnosis not present

## 2024-01-07 DIAGNOSIS — I10 Essential (primary) hypertension: Secondary | ICD-10-CM | POA: Diagnosis not present

## 2024-01-07 DIAGNOSIS — R197 Diarrhea, unspecified: Secondary | ICD-10-CM | POA: Diagnosis not present

## 2024-01-07 DIAGNOSIS — K648 Other hemorrhoids: Secondary | ICD-10-CM | POA: Diagnosis not present

## 2024-01-07 DIAGNOSIS — Z800 Family history of malignant neoplasm of digestive organs: Secondary | ICD-10-CM | POA: Diagnosis not present

## 2024-01-10 DIAGNOSIS — M797 Fibromyalgia: Secondary | ICD-10-CM | POA: Diagnosis not present

## 2024-01-10 DIAGNOSIS — M6281 Muscle weakness (generalized): Secondary | ICD-10-CM | POA: Diagnosis not present

## 2024-01-12 DIAGNOSIS — M6281 Muscle weakness (generalized): Secondary | ICD-10-CM | POA: Diagnosis not present

## 2024-01-12 DIAGNOSIS — M797 Fibromyalgia: Secondary | ICD-10-CM | POA: Diagnosis not present

## 2024-01-13 ENCOUNTER — Encounter: Payer: Self-pay | Admitting: Internal Medicine

## 2024-01-18 ENCOUNTER — Ambulatory Visit: Admitting: Family Medicine

## 2024-01-18 ENCOUNTER — Other Ambulatory Visit: Payer: Self-pay | Admitting: Internal Medicine

## 2024-01-21 DIAGNOSIS — M6281 Muscle weakness (generalized): Secondary | ICD-10-CM | POA: Diagnosis not present

## 2024-01-21 DIAGNOSIS — M797 Fibromyalgia: Secondary | ICD-10-CM | POA: Diagnosis not present

## 2024-01-24 DIAGNOSIS — M797 Fibromyalgia: Secondary | ICD-10-CM | POA: Diagnosis not present

## 2024-01-24 DIAGNOSIS — M6281 Muscle weakness (generalized): Secondary | ICD-10-CM | POA: Diagnosis not present

## 2024-01-26 DIAGNOSIS — M797 Fibromyalgia: Secondary | ICD-10-CM | POA: Diagnosis not present

## 2024-01-26 DIAGNOSIS — M6281 Muscle weakness (generalized): Secondary | ICD-10-CM | POA: Diagnosis not present

## 2024-02-03 DIAGNOSIS — L821 Other seborrheic keratosis: Secondary | ICD-10-CM | POA: Diagnosis not present

## 2024-02-03 DIAGNOSIS — D2272 Melanocytic nevi of left lower limb, including hip: Secondary | ICD-10-CM | POA: Diagnosis not present

## 2024-02-03 DIAGNOSIS — D1801 Hemangioma of skin and subcutaneous tissue: Secondary | ICD-10-CM | POA: Diagnosis not present

## 2024-02-03 DIAGNOSIS — L570 Actinic keratosis: Secondary | ICD-10-CM | POA: Diagnosis not present

## 2024-02-03 DIAGNOSIS — L814 Other melanin hyperpigmentation: Secondary | ICD-10-CM | POA: Diagnosis not present

## 2024-02-03 DIAGNOSIS — D171 Benign lipomatous neoplasm of skin and subcutaneous tissue of trunk: Secondary | ICD-10-CM | POA: Diagnosis not present

## 2024-02-03 DIAGNOSIS — D2271 Melanocytic nevi of right lower limb, including hip: Secondary | ICD-10-CM | POA: Diagnosis not present

## 2024-02-03 DIAGNOSIS — D2239 Melanocytic nevi of other parts of face: Secondary | ICD-10-CM | POA: Diagnosis not present

## 2024-02-08 NOTE — Progress Notes (Unsigned)
 Office Visit Note  Patient: Jade Cardenas             Date of Birth: 11-12-1950           MRN: [id number]             PCP: Charlett Apolinar POUR, MD Referring: Charlett Apolinar POUR, MD Visit Date: 02/22/2024 Occupation: Data Unavailable  Subjective:  No chief complaint on file.   History of Present Illness: Jade Cardenas is a 73 y.o. female ***   Hiatal hernia: Admitted to hospital on 06/29/23 for an attempted robotic hernia repair--operative course complicated by significant bleeding in midportion of stomach after stomach reduced and hiatus dissected.  She required 2 rounds of CPR due to cardiac arrest, laparotomy with ligation of splenic artery, 5 units of PRBC and gastric tube placement. While hospitalized she experienced urinary retention leading to UTI. She was admitted to rehab on 07/06/2023--methotrexate  resumed on 07/06/2023 and Simponi  resumed on 07/07/2023. Developed chest pain and a cough--due to fluid overload-prescribed Lasix  x 5 days. Bilateral lower extremity Dopplers performed due to lower extremity edema and immobility.  Patient was found to have several DVTs.  She was started on heparin  and then transition to Eliquis . She has remained on Eliquis  outpatient.  She has been going to physical therapy twice a week.  She is starting to gain her strength back and has been using a cane to assist with ambulation. She continues to have bouts of abdominal cramping and diarrhea which she attributes to ischemic colitis.  She is currently taking budesonide as prescribed  Activities of Daily Living:  Patient reports morning stiffness for *** {minute/hour:19697}.   Patient {ACTIONS;DENIES/REPORTS:21021675::Denies} nocturnal pain.  Difficulty dressing/grooming: {ACTIONS;DENIES/REPORTS:21021675::Denies} Difficulty climbing stairs: {ACTIONS;DENIES/REPORTS:21021675::Denies} Difficulty getting out of chair: {ACTIONS;DENIES/REPORTS:21021675::Denies} Difficulty using hands for taps, buttons,  cutlery, and/or writing: {ACTIONS;DENIES/REPORTS:21021675::Denies}  No Rheumatology ROS completed.   PMFS History:  Patient Active Problem List   Diagnosis Date Noted   Acute blood loss anemia 07/19/2023   DVT of axillary vein, acute bilateral (HCC) 07/19/2023   Debility 07/06/2023   Hiatal hernia 06/29/2023   Small fiber neuropathy 06/15/2021   Osteopenia of multiple sites 08/21/2020   History of total knee replacement, left 06/28/2017   Varicose veins of bilateral lower extremities with other complications 01/04/2017   DDD (degenerative disc disease), lumbar 10/19/2016   Goiter 08/28/2016   Family history of malignant neoplasm of digestive organ 08/27/2016   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   Fibromyalgia 05/17/2016   DJD (degenerative joint disease), cervical 02/18/2016   Heel spur 09/29/2015   Ingrown toenail 09/29/2015   Foraminal stenosis of cervical region 07/30/2015   Microcytic hypochromic anemia 05/22/2015   Tailor's bunion of left foot 01/18/2015   H/O hiatal hernia    Bilateral primary osteoarthritis of knee 12/15/2013   Iron deficiency 12/15/2013   Psoriatic arthritis (HCC) 12/15/2013   History of foot fracture 12/15/2013   Dermatitis 10/17/2013   Hx of diethylstilbestrol (DES) exposure in utero unknown 12/09/2012   Tinnitus 06/23/2012   Scalp lesion 01/21/2012   Headache 01/21/2012   Hx of psoriasis 01/21/2012   Weight gain 01/21/2012   Ankylosing spondylitis  possible 09/03/2011   Hx of transfusion 09/03/2011   S/P lumbar fusion 6 13 09/03/2011   Lumbar stenosis with neurogenic claudication 08/05/2011   Allergic rhinitis 02/19/2010   GERD 02/19/2010   SPINAL STENOSIS, CERVICAL 02/19/2010   Disorder of bone and cartilage 02/19/2010   IBS 04/28/2007   History of gastric ulcer 02/09/2007  History of colonic polyps 02/09/2007   CHOLECYSTECTOMY, HX OF 02/09/2007   Hyperlipidemia 12/29/2006   Essential hypertension 12/29/2006    Past Medical History:   Diagnosis Date   Anemia    iron def   Arthritis    rheumatoid, psoriatic, osteoarthritis    Blood clot in vein 07/01/2023   both legs   Bone spur    RIGHT FOOT    Broken toe    Bruises easily    Cellulitis    Chronic back pain    COLONIC POLYPS, HX OF 02/09/2007   Congestive heart failure (HCC) 07/01/2023   Cough, persistent 01/21/2012   poss from acei    DES exposure in utero, unknown    Femur fracture, left (HCC)    Fibromyalgia    doesn't require meds   GASTRIC ULCER, ACUTE, HEMORRHAGE, HX OF 02/09/2007   GERD (gastroesophageal reflux disease)    takes Nexium  daily   Goiter    H/O hiatal hernia    H/O: hysterectomy    when 73 yrs old   Headache    Occasional migraines with increased stress   Hx of seasonal allergies    takes Claritin  daily   HYPERLIPIDEMIA 12/29/2006   takes Lovastatin  nightly   HYPERTENSION 12/29/2006   takes Enalapril  daily   IBS 04/28/2007   Insomnia    related to pain;takes Flexeril  and Tylenol  PM nightly   Ischemic colitis 07/01/2023   Joint pain    Joint swelling    Neuropathy    small cell   Osteopenia    Palpitations 02/09/2007   PONV (postoperative nausea and vomiting)    NAUSEA   Pulmonary edema 07/01/2023   REDUCTION MAMMOPLASTY, HX OF 02/09/2007   Right bundle branch block 02/09/2007   S/P lumbar fusion 6 13 09/03/2011   L4 L5  posterior    SYNCOPE 12/07/2007   Tendonitis    RLE    UNS ADVRS EFF OTH RX MEDICINAL\T\BIOLOGICAL SBSTNC 02/09/2007    Family History  Problem Relation Age of Onset   Diabetes Mother    Colon cancer Mother        x2   Arthritis Mother    Hypertension Mother    Endometrial cancer Mother    Stroke Father    Heart disease Father    Colon cancer Father    Hypertension Maternal Grandmother    Stroke Maternal Grandmother    Arthritis Brother    Hypertension Brother    Hyperlipidemia Brother    Infertility Brother    Diabetes Brother    Other Other        DISH brother   Anesthesia  problems Neg Hx    Hypotension Neg Hx    Malignant hyperthermia Neg Hx    Pseudochol deficiency Neg Hx    Breast cancer Neg Hx    Past Surgical History:  Procedure Laterality Date   BACK SURGERY  08/2011   lumbar L4-L5 fusion, lamenectomy   bone spur  6-80yrs ago   right ankle   BUNIONECTOMY  10/03/2014   right foot   BUNIONECTOMY Left    CERVICAL FUSION     2 12    CHOLECYSTECTOMY  1996   HARDWARE REMOVAL Left 08/01/2018   Procedure: Left knee hardware removal;  Surgeon: Melodi Lerner, MD;  Location: [facility];  Service: Orthopedics;  Laterality: Left;    HIATAL HERNIA REPAIR  06/29/2023   LAPAROTOMY N/A 06/29/2023   Procedure: LAPAROTOMY, EXPLORATORY W/LIGATION SPLENIC ARTERY AND GASTRIC TUBE PLACEMENT;  Surgeon: Rubin Calamity, MD;  Location: [facility] OR;  Service: General;  Laterality: N/A;   POSTERIOR CERVICAL LAMINECTOMY Left 07/30/2015   Procedure: Laminectomy and Foraminotomy - left - Cervical two -Cervical three;  Surgeon: Victory Gunnels, MD;  Location: [facility];  Service: Neurosurgery;  Laterality: Left;   REDUCTION MAMMAPLASTY Bilateral 1989   TONSILLECTOMY     as a child   TOTAL ABDOMINAL HYSTERECTOMY W/ BILATERAL SALPINGOOPHORECTOMY  1986   TOTAL KNEE ARTHROPLASTY N/A 06/21/2017   Procedure: LEFT TOTAL KNEE ARTHROPLASTY AND RIGHT KNEE CORTISONE INJECTION;  Surgeon: Melodi Lerner, MD;  Location: [facility];  Service: Orthopedics;  Laterality: N/A;   XI ROBOTIC ASSISTED HIATAL HERNIA REPAIR N/A 06/29/2023   Procedure: ATTEMPTED XI ROBOTIC ASSISTED HIATAL HERNIA REPAIR WITH FUNDOPLICATION AND MESH;  Surgeon: Rubin Calamity, MD;  Location: [facility] OR;  Service: General;  Laterality: N/A;  NO MESH INSERTED   Social History   Tobacco Use   Smoking status: Never    Passive exposure: Never   Smokeless tobacco: Never  Vaping Use   Vaping status: Never Used  Substance Use Topics   Alcohol use: No    Alcohol/week: 0.0 standard drinks of alcohol   Drug use: No   Social  History   Social History Narrative   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  Husband died suddenly 2010Non smoker         Left handed    Lives in a one story home            Immunization History  Administered Date(s) Administered   Fluad Quad(high Dose 65+) 11/04/2018, 11/07/2019, 11/21/2020, 11/11/2021   Fluad Trivalent(High Dose 65+) 11/12/2022   INFLUENZA, HIGH DOSE SEASONAL PF 11/26/2015, 11/30/2016, 11/03/2017, 12/05/2020, 11/18/2023   Influenza Split 11/29/2012, 11/27/2013   Influenza Whole 11/09/2008   Influenza,inj,Quad PF,6+ Mos 11/27/2014   Influenza-Unspecified 12/22/2016   Moderna Covid-19 Fall Seasonal Vaccine 39yrs & older 11/18/2022   PFIZER Comirnaty(Gray Top)Covid-19 Tri-Sucrose Vaccine 03/28/2020, 11/24/2021   PFIZER(Purple Top)SARS-COV-2 Vaccination 04/08/2019, 05/03/2019, 10/19/2019, 03/28/2020   PPD Test 12/18/2013, 11/27/2014, 11/26/2015, 11/30/2016   Pfizer Covid-19 Vaccine Bivalent Booster 18yrs & up 11/21/2020   Pneumococcal Conjugate-13 02/28/2013   Pneumococcal Polysaccharide-23 03/03/2003, 02/28/2014, 11/07/2019   Respiratory Syncytial Virus Vaccine,Recomb Aduvanted(Arexvy) 12/12/2021   Td 05/09/2008   Tdap 07/19/2018   Zoster Recombinant(Shingrix ) 12/03/2016, 02/04/2017   Zoster, Live 01/20/2011     Objective: Vital Signs: LMP 03/02/1984    Physical Exam   Musculoskeletal Exam: ***  CDAI Exam: CDAI Score: -- Patient Global: --; Provider Global: -- Swollen: --; Tender: -- Joint Exam 02/22/2024   No joint exam has been documented for this visit   There is currently no information documented on the homunculus. Go to the Rheumatology activity and complete the homunculus joint exam.  Investigation: No additional findings.  Imaging: No results found.  Recent Labs: Lab Results  Component Value Date   WBC 6.2 11/30/2023   HGB 10.8 (L) 11/30/2023   PLT 428 (H) 11/30/2023   NA  141 11/30/2023   K 3.7 11/30/2023   CL 104 11/30/2023   CO2 30 11/30/2023   GLUCOSE 130 (H) 11/30/2023   BUN 17 11/30/2023   CREATININE 0.76 11/30/2023   BILITOT 0.5 11/30/2023   ALKPHOS 72 11/30/2023   AST 15 11/30/2023   ALT 12 11/30/2023   PROT 7.6 11/30/2023   ALBUMIN  4.6 11/30/2023   CALCIUM  9.6 11/30/2023   GFRAA 87 02/02/2020  QFTBGOLDPLUS NEGATIVE 09/28/2023    Speciality Comments: Failed therapy: Arava and Otezla due to diarrhea Enbrel, Humira-neuropathy  Procedures:  No procedures performed Allergies: Erythromycin, Iodinated contrast media, Ace inhibitors, Indomethacin, Keflex [cephalexin], and Macrobid  [nitrofurantoin ]   Assessment / Plan:     Visit Diagnoses: Psoriatic arthritis (HCC)  Hx of psoriasis  High risk medication use  Primary osteoarthritis of both hands  Primary osteoarthritis of right knee  Total knee replacement status, left  Primary osteoarthritis of both feet  DDD (degenerative disc disease), cervical  Spondylosis of lumbar spine  Small fiber neuropathy  Fibromyalgia  Chronic fatigue  Osteopenia of multiple sites  Other iron deficiency anemia  Hiatal hernia  History of DVT (deep vein thrombosis)  Orders: No orders of the defined types were placed in this encounter.  No orders of the defined types were placed in this encounter.   Face-to-face time spent with patient was *** minutes. Greater than 50% of time was spent in counseling and coordination of care.  Follow-Up Instructions: No follow-ups on file.   Waddell CHRISTELLA Craze, PA-C  Note - This record has been created using Dragon software.  Chart creation errors have been sought, but may not always  have been located. Such creation errors do not reflect on  the standard of medical care.

## 2024-02-22 ENCOUNTER — Encounter: Payer: Self-pay | Admitting: Physician Assistant

## 2024-02-22 ENCOUNTER — Other Ambulatory Visit: Payer: Self-pay

## 2024-02-22 ENCOUNTER — Ambulatory Visit: Attending: Physician Assistant | Admitting: Physician Assistant

## 2024-02-22 VITALS — BP 135/76 | HR 86 | Temp 98.0°F | Resp 12 | Ht 65.0 in | Wt 189.6 lb

## 2024-02-22 DIAGNOSIS — M5030 Other cervical disc degeneration, unspecified cervical region: Secondary | ICD-10-CM | POA: Insufficient documentation

## 2024-02-22 DIAGNOSIS — Z872 Personal history of diseases of the skin and subcutaneous tissue: Secondary | ICD-10-CM | POA: Diagnosis present

## 2024-02-22 DIAGNOSIS — Z79899 Other long term (current) drug therapy: Secondary | ICD-10-CM | POA: Insufficient documentation

## 2024-02-22 DIAGNOSIS — R5382 Chronic fatigue, unspecified: Secondary | ICD-10-CM | POA: Insufficient documentation

## 2024-02-22 DIAGNOSIS — M19042 Primary osteoarthritis, left hand: Secondary | ICD-10-CM | POA: Diagnosis present

## 2024-02-22 DIAGNOSIS — Z86718 Personal history of other venous thrombosis and embolism: Secondary | ICD-10-CM | POA: Diagnosis present

## 2024-02-22 DIAGNOSIS — K449 Diaphragmatic hernia without obstruction or gangrene: Secondary | ICD-10-CM | POA: Insufficient documentation

## 2024-02-22 DIAGNOSIS — Z96652 Presence of left artificial knee joint: Secondary | ICD-10-CM | POA: Insufficient documentation

## 2024-02-22 DIAGNOSIS — M19071 Primary osteoarthritis, right ankle and foot: Secondary | ICD-10-CM | POA: Insufficient documentation

## 2024-02-22 DIAGNOSIS — M8589 Other specified disorders of bone density and structure, multiple sites: Secondary | ICD-10-CM | POA: Diagnosis present

## 2024-02-22 DIAGNOSIS — M1711 Unilateral primary osteoarthritis, right knee: Secondary | ICD-10-CM | POA: Diagnosis not present

## 2024-02-22 DIAGNOSIS — L4050 Arthropathic psoriasis, unspecified: Secondary | ICD-10-CM

## 2024-02-22 DIAGNOSIS — D508 Other iron deficiency anemias: Secondary | ICD-10-CM | POA: Diagnosis present

## 2024-02-22 DIAGNOSIS — G629 Polyneuropathy, unspecified: Secondary | ICD-10-CM | POA: Insufficient documentation

## 2024-02-22 DIAGNOSIS — Z9225 Personal history of immunosupression therapy: Secondary | ICD-10-CM

## 2024-02-22 DIAGNOSIS — M19072 Primary osteoarthritis, left ankle and foot: Secondary | ICD-10-CM | POA: Diagnosis present

## 2024-02-22 DIAGNOSIS — M797 Fibromyalgia: Secondary | ICD-10-CM | POA: Insufficient documentation

## 2024-02-22 DIAGNOSIS — M47816 Spondylosis without myelopathy or radiculopathy, lumbar region: Secondary | ICD-10-CM | POA: Insufficient documentation

## 2024-02-22 DIAGNOSIS — M19041 Primary osteoarthritis, right hand: Secondary | ICD-10-CM | POA: Diagnosis present

## 2024-02-22 MED ORDER — FOLIC ACID 1 MG PO TABS: 1.0000 mg | ORAL_TABLET | Freq: Every day | ORAL | 3 refills | Status: AC

## 2024-02-22 NOTE — Progress Notes (Unsigned)
 Per Waddell Craze, patient would like Folic acid  sent to [facility]. Please review and sign.

## 2024-02-23 LAB — COMPREHENSIVE METABOLIC PANEL WITH GFR
AG Ratio: 1.4 (calc) (ref 1.0–2.5)
ALT: 12 U/L (ref 6–29)
AST: 15 U/L (ref 10–35)
Albumin: 4.3 g/dL (ref 3.6–5.1)
Alkaline phosphatase (APISO): 94 U/L (ref 37–153)
BUN: 20 mg/dL (ref 7–25)
CO2: 28 mmol/L (ref 20–32)
Calcium: 9.9 mg/dL (ref 8.6–10.4)
Chloride: 103 mmol/L (ref 98–110)
Creat: 0.68 mg/dL (ref 0.60–1.00)
Globulin: 3 g/dL (ref 1.9–3.7)
Glucose, Bld: 93 mg/dL (ref 65–99)
Potassium: 4.7 mmol/L (ref 3.5–5.3)
Sodium: 139 mmol/L (ref 135–146)
Total Bilirubin: 0.7 mg/dL (ref 0.2–1.2)
Total Protein: 7.3 g/dL (ref 6.1–8.1)
eGFR: 93 mL/min/1.73m2

## 2024-02-23 LAB — CBC WITH DIFFERENTIAL/PLATELET
Absolute Lymphocytes: 1928 {cells}/uL (ref 850–3900)
Absolute Monocytes: 428 {cells}/uL (ref 200–950)
Basophils Absolute: 50 {cells}/uL (ref 0–200)
Basophils Relative: 0.8 %
Eosinophils Absolute: 112 {cells}/uL (ref 15–500)
Eosinophils Relative: 1.8 %
HCT: 41.3 % (ref 35.9–46.0)
Hemoglobin: 13.9 g/dL (ref 11.7–15.5)
MCH: 33.1 pg — ABNORMAL HIGH (ref 27.0–33.0)
MCHC: 33.7 g/dL (ref 31.6–35.4)
MCV: 98.3 fL (ref 81.4–101.7)
MPV: 10.8 fL (ref 7.5–12.5)
Monocytes Relative: 6.9 %
Neutro Abs: 3683 {cells}/uL (ref 1500–7800)
Neutrophils Relative %: 59.4 %
Platelets: 333 Thousand/uL (ref 140–400)
RBC: 4.2 Million/uL (ref 3.80–5.10)
RDW: 14 % (ref 11.0–15.0)
Total Lymphocyte: 31.1 %
WBC: 6.2 Thousand/uL (ref 3.8–10.8)

## 2024-02-24 ENCOUNTER — Ambulatory Visit: Payer: Self-pay | Admitting: Physician Assistant

## 2024-02-24 NOTE — Progress Notes (Signed)
CMP WNL. CBC WNL.

## 2024-03-01 ENCOUNTER — Other Ambulatory Visit: Payer: Self-pay | Admitting: Physician Assistant

## 2024-03-03 NOTE — Telephone Encounter (Signed)
 Last Fill: 10/26/2023  Labs: 02/22/2024  CMP WNL CBC WNL  TB Gold: 09/28/2023  Negative  Next Visit: 07/26/2024  Last Visit: 02/22/2024  IK:Ednmpjupr arthritis (HCC)   Current Dose per office note 02/22/2024: Simponi  50 mg every 28 days   Okay to refill Simponi ?

## 2024-03-08 NOTE — Telephone Encounter (Signed)
 Ok to increase methotrexate  to 0.4 ml sq injections once weekly-recheck CBC and CMP 2 weeks after increasing the dose.

## 2024-03-09 ENCOUNTER — Telehealth: Payer: Self-pay | Admitting: Pharmacist

## 2024-03-09 NOTE — Telephone Encounter (Signed)
 Received a fax from [employer] regarding an approval for SIMPONI  SQ patient assistance from 03/02/2024 to 03/01/2025. Approval letter sent to scan center.  Phone: [phone]  Sherry Pennant, PharmD, MPH, BCPS, CPP Clinical Pharmacist

## 2024-03-15 LAB — OPHTHALMOLOGY REPORT-SCANNED

## 2024-03-22 ENCOUNTER — Encounter: Payer: Self-pay | Admitting: Hematology

## 2024-03-28 ENCOUNTER — Inpatient Hospital Stay: Admitting: Hematology

## 2024-03-28 ENCOUNTER — Inpatient Hospital Stay

## 2024-04-04 LAB — OPHTHALMOLOGY REPORT-SCANNED

## 2024-04-07 ENCOUNTER — Other Ambulatory Visit: Payer: Self-pay | Admitting: Physician Assistant

## 2024-04-07 NOTE — Telephone Encounter (Signed)
 Last Fill: 01/06/2024  Labs: 02/22/2024 CMP WNL CBC WNL  Next Visit: 07/26/2024  Last Visit: 02/22/2024  DX: Psoriatic arthritis   Current Dose per office note on 02/22/2024: methotrexate  0.3 ml every 7 days   Okay to refill Methotrexate ?

## 2024-04-21 ENCOUNTER — Ambulatory Visit

## 2024-04-26 ENCOUNTER — Inpatient Hospital Stay

## 2024-04-26 ENCOUNTER — Inpatient Hospital Stay: Admitting: Hematology

## 2024-05-17 ENCOUNTER — Ambulatory Visit: Admitting: Internal Medicine

## 2024-05-30 ENCOUNTER — Ambulatory Visit

## 2024-07-26 ENCOUNTER — Ambulatory Visit: Admitting: Rheumatology

## 2024-08-14 ENCOUNTER — Ambulatory Visit: Admitting: Neurology

## 2024-08-15 ENCOUNTER — Ambulatory Visit (HOSPITAL_BASED_OUTPATIENT_CLINIC_OR_DEPARTMENT_OTHER): Admitting: Obstetrics & Gynecology
